# Patient Record
Sex: Female | Born: 1950 | Race: White | Hispanic: No | Smoking: Never smoker
Health system: Southern US, Community
[De-identification: ages and names within clinical notes are randomized; demographics above are authoritative.]

## PROBLEM LIST (undated history)

## (undated) DIAGNOSIS — Z8619 Personal history of other infectious and parasitic diseases: Secondary | ICD-10-CM

## (undated) DIAGNOSIS — M1990 Unspecified osteoarthritis, unspecified site: Secondary | ICD-10-CM

## (undated) DIAGNOSIS — T8859XA Other complications of anesthesia, initial encounter: Secondary | ICD-10-CM

## (undated) DIAGNOSIS — F32A Depression, unspecified: Secondary | ICD-10-CM

## (undated) DIAGNOSIS — D039 Melanoma in situ, unspecified: Secondary | ICD-10-CM

## (undated) DIAGNOSIS — T7840XA Allergy, unspecified, initial encounter: Secondary | ICD-10-CM

## (undated) DIAGNOSIS — C801 Malignant (primary) neoplasm, unspecified: Secondary | ICD-10-CM

## (undated) DIAGNOSIS — K219 Gastro-esophageal reflux disease without esophagitis: Secondary | ICD-10-CM

## (undated) DIAGNOSIS — E785 Hyperlipidemia, unspecified: Secondary | ICD-10-CM

## (undated) DIAGNOSIS — Z85528 Personal history of other malignant neoplasm of kidney: Secondary | ICD-10-CM

## (undated) DIAGNOSIS — F329 Major depressive disorder, single episode, unspecified: Secondary | ICD-10-CM

## (undated) DIAGNOSIS — N189 Chronic kidney disease, unspecified: Secondary | ICD-10-CM

## (undated) DIAGNOSIS — R1310 Dysphagia, unspecified: Secondary | ICD-10-CM

## (undated) DIAGNOSIS — I10 Essential (primary) hypertension: Secondary | ICD-10-CM

## (undated) DIAGNOSIS — R7989 Other specified abnormal findings of blood chemistry: Secondary | ICD-10-CM

## (undated) DIAGNOSIS — T4145XA Adverse effect of unspecified anesthetic, initial encounter: Secondary | ICD-10-CM

## (undated) DIAGNOSIS — G4730 Sleep apnea, unspecified: Secondary | ICD-10-CM

## (undated) DIAGNOSIS — R52 Pain, unspecified: Secondary | ICD-10-CM

## (undated) HISTORY — PX: FOOT SURGERY: SHX648

## (undated) HISTORY — PX: COLONOSCOPY: SHX174

## (undated) HISTORY — DX: Personal history of other malignant neoplasm of kidney: Z85.528

## (undated) HISTORY — DX: Unspecified osteoarthritis, unspecified site: M19.90

## (undated) HISTORY — PX: COLONOSCOPY W/ POLYPECTOMY: SHX1380

## (undated) HISTORY — PX: JOINT REPLACEMENT: SHX530

## (undated) HISTORY — DX: Gastro-esophageal reflux disease without esophagitis: K21.9

## (undated) HISTORY — PX: MOHS SURGERY: SUR867

## (undated) HISTORY — DX: Dysphagia, unspecified: R13.10

## (undated) HISTORY — DX: Sleep apnea, unspecified: G47.30

## (undated) HISTORY — DX: Melanoma in situ, unspecified: D03.9

## (undated) HISTORY — DX: Hyperlipidemia, unspecified: E78.5

## (undated) HISTORY — PX: CHOLECYSTECTOMY: SHX55

## (undated) HISTORY — DX: Allergy, unspecified, initial encounter: T78.40XA

## (undated) HISTORY — PX: POLYPECTOMY: SHX149

## (undated) HISTORY — DX: Malignant (primary) neoplasm, unspecified: C80.1

## (undated) HISTORY — DX: Chronic kidney disease, unspecified: N18.9

## (undated) HISTORY — DX: Other specified abnormal findings of blood chemistry: R79.89

---
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
## 1990-02-14 HISTORY — PX: GALLBLADDER SURGERY: SHX652

## 1990-02-14 HISTORY — PX: MELANOMA EXCISION: SHX5266

## 1994-02-14 HISTORY — PX: KNEE ARTHROSCOPY: SUR90

## 2007-02-15 HISTORY — PX: SALPINGOOPHORECTOMY: SHX82

## 2010-10-16 LAB — HM MAMMOGRAPHY: HM Mammogram: NORMAL

## 2010-10-16 LAB — HM DIABETES EYE EXAM: HM Diabetic Eye Exam: NORMAL

## 2010-10-16 LAB — HM PAP SMEAR: HM Pap smear: NORMAL

## 2011-07-29 ENCOUNTER — Ambulatory Visit (INDEPENDENT_AMBULATORY_CARE_PROVIDER_SITE_OTHER): Payer: 59 | Admitting: Internal Medicine

## 2011-07-29 ENCOUNTER — Encounter: Payer: Self-pay | Admitting: Internal Medicine

## 2011-07-29 VITALS — BP 160/78 | HR 80 | Temp 98.1°F | Resp 16 | Ht 70.0 in | Wt 173.0 lb

## 2011-07-29 DIAGNOSIS — R0989 Other specified symptoms and signs involving the circulatory and respiratory systems: Secondary | ICD-10-CM

## 2011-07-29 DIAGNOSIS — G4730 Sleep apnea, unspecified: Secondary | ICD-10-CM

## 2011-07-29 DIAGNOSIS — R0609 Other forms of dyspnea: Secondary | ICD-10-CM | POA: Insufficient documentation

## 2011-07-29 DIAGNOSIS — G4733 Obstructive sleep apnea (adult) (pediatric): Secondary | ICD-10-CM | POA: Insufficient documentation

## 2011-07-29 DIAGNOSIS — Z0000 Encounter for general adult medical examination without abnormal findings: Secondary | ICD-10-CM | POA: Insufficient documentation

## 2011-07-29 DIAGNOSIS — E7800 Pure hypercholesterolemia, unspecified: Secondary | ICD-10-CM | POA: Insufficient documentation

## 2011-07-29 DIAGNOSIS — I10 Essential (primary) hypertension: Secondary | ICD-10-CM

## 2011-07-29 MED ORDER — OLMESARTAN MEDOXOMIL 20 MG PO TABS: 20.0000 mg | ORAL_TABLET | Freq: Every day | ORAL | Status: DC

## 2011-07-29 NOTE — Patient Instructions (Signed)
Hypertension As your heart beats, it forces blood through your arteries. This force is your blood pressure. If the pressure is too high, it is called hypertension (HTN) or high blood pressure. HTN is dangerous because you may have it and not know it. High blood pressure may mean that your heart has to work harder to pump blood. Your arteries may be narrow or stiff. The extra work puts you at risk for heart disease, stroke, and other problems.  Blood pressure consists of two numbers, a higher number over a lower, 110/72, for example. It is stated as "110 over 72." The ideal is below 120 for the top number (systolic) and under 80 for the bottom (diastolic). Write down your blood pressure today. You should pay close attention to your blood pressure if you have certain conditions such as:  Heart failure.   Prior heart attack.   Diabetes   Chronic kidney disease.   Prior stroke.   Multiple risk factors for heart disease.  To see if you have HTN, your blood pressure should be measured while you are seated with your arm held at the level of the heart. It should be measured at least twice. A one-time elevated blood pressure reading (especially in the Emergency Department) does not mean that you need treatment. There may be conditions in which the blood pressure is different between your right and left arms. It is important to see your caregiver soon for a recheck. Most people have essential hypertension which means that there is not a specific cause. This type of high blood pressure may be lowered by changing lifestyle factors such as:  Stress.   Smoking.   Lack of exercise.   Excessive weight.   Drug/tobacco/alcohol use.   Eating less salt.  Most people do not have symptoms from high blood pressure until it has caused damage to the body. Effective treatment can often prevent, delay or reduce that damage. TREATMENT  When a cause has been identified, treatment for high blood pressure is  directed at the cause. There are a large number of medications to treat HTN. These fall into several categories, and your caregiver will help you select the medicines that are best for you. Medications may have side effects. You should review side effects with your caregiver. If your blood pressure stays high after you have made lifestyle changes or started on medicines,   Your medication(s) may need to be changed.   Other problems may need to be addressed.   Be certain you understand your prescriptions, and know how and when to take your medicine.   Be sure to follow up with your caregiver within the time frame advised (usually within two weeks) to have your blood pressure rechecked and to review your medications.   If you are taking more than one medicine to lower your blood pressure, make sure you know how and at what times they should be taken. Taking two medicines at the same time can result in blood pressure that is too low.  SEEK IMMEDIATE MEDICAL CARE IF:  You develop a severe headache, blurred or changing vision, or confusion.   You have unusual weakness or numbness, or a faint feeling.   You have severe chest or abdominal pain, vomiting, or breathing problems.  MAKE SURE YOU:   Understand these instructions.   Will watch your condition.   Will get help right away if you are not doing well or get worse.  Document Released: 01/31/2005 Document Revised: 01/20/2011 Document Reviewed:   09/21/2007 ExitCare Patient Information 2012 [place].Sleep Apnea Sleep apnea is a common disorder. The main problem of this disorder is excessive daytime sleepiness and compromised quality of life. This may include social and emotional problems. There are two types of sleep apnea.  Obstructive sleep apnea is when breathing stops due to a blocked airway.   Central sleep apnea is a malfunction of the brain's normal signal to breathe.  SYMPTOMS  Restless sleep.   Falling asleep while  driving and/or during the day.   Loss of energy.   Irritability.   Mood or behavior changes.   Loud, heavy snoring.   Morning headaches.   Trouble concentrating.   Forgetfulness.   Anxiety or depression.   Decreased interest in sex.  Not all people with sleep apnea have all of these symptoms. However, people who have a few of these symptoms should visit their caregiver for an evaluation. Problems related to untreated sleep apnea include:  High blood pressure (hypertension).   Coronary artery disease.   Impotence.   Cognitive dysfunction.   Memory loss.  TREATMENT  For mild cases, treatment may include avoiding sleeping on one's back.   For people with nasal congestion, a decongestant may be prescribed.   Patients with obstructive and central apnea should avoid depressants. This includes alcohol, sedatives and narcotics. Weight loss and diet control are encouraged for overweight patients.   Many serious cases of obstructive sleep apnea can be relieved by a treatment called nasal continuous positive airway pressure (nasal CPAP). Nasal CPAP uses a mask-like device and pump that work together to keep the airway open. The pump delivers air pressure during each breath.   Surgery may help some patients by stopping or reducing the narrowing of the airway due to anatomical defects.  PROGNOSIS  Removing the obstruction usually reverses hypertension and cardiac problems. Untreated, sleep apnea sufferers have a tendency to fall asleep during the day. This is can result in serious accident or loss of ones job. RESEARCH Sleep apnea is currently one of the most active areas of sleep research.  Document Released: 01/21/2002 Document Revised: 01/20/2011 Document Reviewed: 05/19/2005 [facility] Patient Information 2012 [place].

## 2011-07-29 NOTE — Progress Notes (Signed)
  Subjective:    Patient ID: Kelsey Church, female    DOB: [birth year], 61 y.o.   MRN: [id number]  HPI New to me she complains of a 10 year history of heavy snoring, apnea, weight gain, and fatigue.   Review of Systems  Constitutional: Positive for fatigue and unexpected weight change. Negative for fever, chills, diaphoresis, activity change and appetite change.  HENT: Negative.   Eyes: Negative for photophobia, pain, discharge, redness, itching and visual disturbance.  Respiratory: Positive for apnea and shortness of breath (some DOE). Negative for cough, choking, chest tightness, wheezing and stridor.   Cardiovascular: Negative for chest pain, palpitations and leg swelling.  Gastrointestinal: Negative for nausea, vomiting, abdominal pain, diarrhea, constipation, blood in stool and anal bleeding.  Genitourinary: Negative.   Musculoskeletal: Positive for arthralgias (knees). Negative for myalgias, back pain, joint swelling and gait problem.  Skin: Negative for color change, pallor, rash and wound.  Neurological: Negative.   Hematological: Negative for adenopathy. Does not bruise/bleed easily.  Psychiatric/Behavioral: Negative.        Objective:   Physical Exam  Vitals reviewed. Constitutional: She is oriented to person, place, and time. She appears well-developed and well-nourished. No distress.  HENT:  Head: Normocephalic and atraumatic.  Mouth/Throat: Oropharynx is clear and moist. No oropharyngeal exudate.  Eyes: Conjunctivae are normal. Right eye exhibits no discharge. Left eye exhibits no discharge. No scleral icterus.  Neck: Normal range of motion. Neck supple. No JVD present. No tracheal deviation present. No thyromegaly present.  Cardiovascular: Normal rate, regular rhythm, normal heart sounds and intact distal pulses.  Exam reveals no gallop and no friction rub.   No murmur heard. Pulmonary/Chest: Effort normal and breath sounds normal. No stridor. No respiratory  distress. She has no wheezes. She has no rales. She exhibits no tenderness.  Abdominal: Soft. Bowel sounds are normal. She exhibits no distension and no mass. There is no tenderness. There is no rebound and no guarding.  Musculoskeletal: Normal range of motion. She exhibits no edema and no tenderness.  Lymphadenopathy:    She has no cervical adenopathy.  Neurological: She is oriented to person, place, and time.  Skin: Skin is warm and dry. No rash noted. She is not diaphoretic. No erythema. No pallor.  Psychiatric: She has a normal mood and affect. Her behavior is normal. Judgment and thought content normal.         Assessment & Plan:

## 2011-07-31 ENCOUNTER — Encounter: Payer: Self-pay | Admitting: Internal Medicine

## 2011-08-01 ENCOUNTER — Other Ambulatory Visit (INDEPENDENT_AMBULATORY_CARE_PROVIDER_SITE_OTHER): Payer: 59

## 2011-08-01 ENCOUNTER — Encounter: Payer: Self-pay | Admitting: Internal Medicine

## 2011-08-01 DIAGNOSIS — I10 Essential (primary) hypertension: Secondary | ICD-10-CM

## 2011-08-01 DIAGNOSIS — E7800 Pure hypercholesterolemia, unspecified: Secondary | ICD-10-CM

## 2011-08-01 DIAGNOSIS — R0609 Other forms of dyspnea: Secondary | ICD-10-CM

## 2011-08-01 DIAGNOSIS — G4730 Sleep apnea, unspecified: Secondary | ICD-10-CM

## 2011-08-01 DIAGNOSIS — R0989 Other specified symptoms and signs involving the circulatory and respiratory systems: Secondary | ICD-10-CM

## 2011-08-01 LAB — URINALYSIS, ROUTINE W REFLEX MICROSCOPIC
Bilirubin Urine: NEGATIVE
Hgb urine dipstick: NEGATIVE
Nitrite: NEGATIVE
Total Protein, Urine: NEGATIVE

## 2011-08-01 LAB — COMPREHENSIVE METABOLIC PANEL
Albumin: 3.7 g/dL (ref 3.5–5.2)
BUN: 26 mg/dL — ABNORMAL HIGH (ref 6–23)
CO2: 28 mEq/L (ref 19–32)
Calcium: 8.9 mg/dL (ref 8.4–10.5)
Chloride: 107 mEq/L (ref 96–112)
Glucose, Bld: 83 mg/dL (ref 70–99)
Potassium: 4.2 mEq/L (ref 3.5–5.1)

## 2011-08-01 LAB — CBC WITH DIFFERENTIAL/PLATELET
Basophils Relative: 0.5 % (ref 0.0–3.0)
Eosinophils Relative: 2.6 % (ref 0.0–5.0)
Lymphocytes Relative: 27.3 % (ref 12.0–46.0)
Neutrophils Relative %: 61.8 % (ref 43.0–77.0)
RBC: 5.14 Mil/uL — ABNORMAL HIGH (ref 3.87–5.11)
WBC: 6.9 10*3/uL (ref 4.5–10.5)

## 2011-08-01 LAB — LIPID PANEL
LDL Cholesterol: 85 mg/dL (ref 0–99)
Total CHOL/HDL Ratio: 3
Triglycerides: 127 mg/dL (ref 0.0–149.0)

## 2011-08-01 LAB — TSH: TSH: 1.47 u[IU]/mL (ref 0.35–5.50)

## 2011-08-01 NOTE — Assessment & Plan Note (Signed)
I will check her labs today to look for secondary causes and end organ damage and I have asked her to start Benicar

## 2011-08-01 NOTE — Assessment & Plan Note (Signed)
She has s/s c/w OSA so I have asked her to see pulm for a sleep evaluation, today I will check her labs to look for secondary causes

## 2011-08-01 NOTE — Assessment & Plan Note (Signed)
Her EKG does not show any signs of cardiac involvement, I will check her labs to look for secondary causes

## 2011-08-01 NOTE — Assessment & Plan Note (Signed)
Check FLP CMP TSH today

## 2011-09-09 ENCOUNTER — Ambulatory Visit (INDEPENDENT_AMBULATORY_CARE_PROVIDER_SITE_OTHER): Payer: 59 | Admitting: Internal Medicine

## 2011-09-09 ENCOUNTER — Ambulatory Visit (INDEPENDENT_AMBULATORY_CARE_PROVIDER_SITE_OTHER): Payer: 59 | Admitting: Pulmonary Disease

## 2011-09-09 ENCOUNTER — Encounter: Payer: Self-pay | Admitting: Internal Medicine

## 2011-09-09 ENCOUNTER — Encounter: Payer: Self-pay | Admitting: Pulmonary Disease

## 2011-09-09 VITALS — BP 144/88 | HR 76 | Temp 97.4°F | Resp 16 | Wt 175.0 lb

## 2011-09-09 VITALS — BP 110/68 | HR 90 | Temp 98.4°F | Ht 66.0 in | Wt 174.4 lb

## 2011-09-09 DIAGNOSIS — I10 Essential (primary) hypertension: Secondary | ICD-10-CM

## 2011-09-09 DIAGNOSIS — G4730 Sleep apnea, unspecified: Secondary | ICD-10-CM

## 2011-09-09 DIAGNOSIS — M25569 Pain in unspecified knee: Secondary | ICD-10-CM

## 2011-09-09 DIAGNOSIS — M179 Osteoarthritis of knee, unspecified: Secondary | ICD-10-CM

## 2011-09-09 DIAGNOSIS — M1710 Unilateral primary osteoarthritis, unspecified knee: Secondary | ICD-10-CM | POA: Insufficient documentation

## 2011-09-09 DIAGNOSIS — M25561 Pain in right knee: Secondary | ICD-10-CM

## 2011-09-09 DIAGNOSIS — IMO0002 Reserved for concepts with insufficient information to code with codable children: Secondary | ICD-10-CM

## 2011-09-09 MED ORDER — TRAMADOL HCL 50 MG PO TABS: 50.0000 mg | ORAL_TABLET | Freq: Three times a day (TID) | ORAL | Status: AC | PRN

## 2011-09-09 NOTE — Patient Instructions (Signed)
Degenerative Arthritis  You have osteoarthritis. This is the wear and tear arthritis that comes with aging. It is also called degenerative arthritis. This is common in people past middle age. It is caused by stress on the joints. The large weight bearing joints of the lower extremities are most often affected. The knees, hips, back, neck, and hands can become painful, swollen, and stiff. This is the most common type of arthritis. It comes on with age, carrying too much weight, or from an injury.  Treatment includes resting the sore joint until the pain and swelling improve. Crutches or a walker may be needed for severe flares. Only take over-the-counter or prescription medicines for pain, discomfort, or fever as directed by your caregiver. Local heat therapy may improve motion. Cortisone shots into the joint are sometimes used to reduce pain and swelling during flares.  Osteoarthritis is usually not crippling and progresses slowly. There are things you can do to decrease pain:  · Avoid high impact activities.  · Exercise regularly.  · Low impact exercises such as walking, biking and swimming help to keep the muscles strong and keep normal joint function.  · Stretching helps to keep your range of motion.  · Lose weight if you are overweight. This reduces joint stress.  In severe cases when you have pain at rest or increasing disability, joint surgery may be helpful. See your caregiver for follow-up treatment as recommended.   SEEK IMMEDIATE MEDICAL CARE IF:   · You have severe joint pain.  · Marked swelling and redness in your joint develops.  · You develop a high fever.  Document Released: 01/31/2005 Document Revised: 01/20/2011 Document Reviewed: 07/03/2006  ExitCare® Patient Information ©2012 [employer].

## 2011-09-09 NOTE — Patient Instructions (Addendum)
Schedule sleep study Lets see what we can do about the cramps

## 2011-09-09 NOTE — Assessment & Plan Note (Signed)
Her BP is better

## 2011-09-09 NOTE — Assessment & Plan Note (Signed)
Right knee injected with steroids today, she will cont mobic and will add tramadol for more severe pain

## 2011-09-09 NOTE — Progress Notes (Signed)
Subjective:    Patient ID: Kelsey Church, female    DOB: 11/29/1950, 61 y.o.   MRN: [id number]  Arthritis Presents for follow-up visit. She complains of pain and stiffness. She reports no joint swelling or joint warmth. Affected locations include the right knee and left knee. Her pain is at a severity of 6/10. Associated symptoms include pain at night and pain while resting. Pertinent negatives include no diarrhea, dry eyes, dry mouth, dysuria, fatigue, fever, rash, Raynaud's syndrome, uveitis or weight loss. Compliance with total regimen is 76-100%. Compliance with medications is 76-100%.      Review of Systems  Constitutional: Negative.  Negative for fever, weight loss and fatigue.  HENT: Negative.   Eyes: Negative.   Respiratory: Negative.   Cardiovascular: Negative.   Gastrointestinal: Negative.  Negative for diarrhea.  Genitourinary: Negative.  Negative for dysuria.  Musculoskeletal: Positive for arthralgias (R knee > L knee), arthritis and stiffness. Negative for myalgias, back pain, joint swelling and gait problem.  Skin: Negative.  Negative for rash.  Neurological: Negative.   Hematological: Negative.   Psychiatric/Behavioral: Negative.        Objective:   Physical Exam  Vitals reviewed. Constitutional: She appears well-developed and well-nourished. No distress.  HENT:  Head: Normocephalic and atraumatic.  Mouth/Throat: Oropharynx is clear and moist. No oropharyngeal exudate.  Eyes: Conjunctivae are normal. Right eye exhibits no discharge. Left eye exhibits no discharge. No scleral icterus.  Neck: Normal range of motion. Neck supple. No JVD present. No tracheal deviation present. No thyromegaly present.  Cardiovascular: Normal rate, regular rhythm, normal heart sounds and intact distal pulses.  Exam reveals no gallop and no friction rub.   No murmur heard. Pulmonary/Chest: Effort normal and breath sounds normal. No stridor. No respiratory distress. She has no  wheezes. She has no rales. She exhibits no tenderness.  Abdominal: Soft. Bowel sounds are normal. She exhibits no distension and no mass. There is no tenderness. There is no rebound and no guarding.  Musculoskeletal:       Right knee: She exhibits deformity (djd and valgus deformity). She exhibits normal range of motion, no swelling, no effusion, no ecchymosis, no laceration, no erythema, normal alignment, no LCL laxity and normal patellar mobility. no tenderness found.       Left knee: She exhibits swelling (mild diffuse swelling but no crepitance) and deformity (djd and valgus deformity). She exhibits normal range of motion, no effusion, no ecchymosis, no laceration, no erythema, normal alignment, no LCL laxity and normal patellar mobility. no tenderness found.       R knee cleaned with betadine and draped, local anesthesia was obtained with 2% lido with epi, then a 25 gauge 1 1/2 in needle was used from the lateral approach to inject 1/2 cc plain 1% lido and 1/2 cc depo-medrol (40 mg) into the joint space, she tolerated it well with no complications  Lymphadenopathy:    She has no cervical adenopathy.  Skin: She is not diaphoretic.      Lab Results  Component Value Date   WBC 6.9 08/01/2011   HGB 15.6* 08/01/2011   HCT 46.9* 08/01/2011   PLT 229.0 08/01/2011   GLUCOSE 83 08/01/2011   CHOL 181 08/01/2011   TRIG 127.0 08/01/2011   HDL 71.00 08/01/2011   LDLCALC 85 08/01/2011   ALT 13 08/01/2011   AST 13 08/01/2011   NA 142 08/01/2011   K 4.2 08/01/2011   CL 107 08/01/2011   CREATININE 0.9 08/01/2011   BUN  26* 08/01/2011   CO2 28 08/01/2011   TSH 1.47 08/01/2011      Assessment & Plan:

## 2011-09-09 NOTE — Assessment & Plan Note (Signed)
Low pre test probability Given excessive daytime somnolence, narrow pharyngeal exam & loud snoring, obstructive sleep apnea is very likely & an overnight polysomnogram will be scheduled as a split study. The pathophysiology of obstructive sleep apnea , it's cardiovascular consequences & modes of treatment including CPAP were discused with the patient in detail & they evidenced understanding. Cause of frequent awakenings include cramps in left knee - she will look into holistic options for this.

## 2011-09-09 NOTE — Progress Notes (Signed)
  Subjective:    Patient ID: Kelsey Church, female    DOB: Dec 14, 1950, 61 y.o.   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 She is moved from [place] within the past year to be with her son in [place] after the demise of her husband. Family members have noted her loud snoring and breathing pauses during her sleep. She has tried snoring strips and using a contoured pillow but this has not decrease her snoring. Epworth sleepiness score is 7/24. She report sleepiness while sitting and reading and watching TV towards late evening. She was recently started on Celexa for depression and falls asleep within 30 minutes of taking this. Bedtime is between 9 to 10 PM, she sleeps on her left side due to bursitis in her right hip. She's broken up at least 2-3 times a week 9Y cramping in her left knee and she has to get up and walk around for about 10 minutes before falling asleep again. She is out of bed by 6 AM without headaches and reports occasional dryness of mouth. She feels like she is not rested. She denies excessive use of caffeinated beverages or recent weight gain. She has a full plate dentures for her lower jaw which was made in 2006. There is no history suggestive of cataplexy, sleep paralysis or parasomnias   Past Medical History  Diagnosis Date  . Arthritis   . Melanoma in situ     Past Surgical History  Procedure Date  . Appendectomy 1958  . Gallbladder surgery 02/14/1990  . Salpingoophorectomy 2009    Right  . Knee arthroscopy 1996   No Known Allergies  History   Social History  . Marital Status: Widowed    Spouse Name: N/A    Number of Children: 1  . Years of Education: N/A   Occupational History  . [employer]   Social History Main Topics  . Smoking status: Never Smoker   . Smokeless tobacco: Never Used  . Alcohol Use: No  . Drug Use: No  . Sexually Active: Not Currently   Other Topics Concern    . Not on file   Social History Narrative   Caffienated drinks-noSeat belt use often-yesRegular Exercise-yesSmoke alarm in the home-yesFirearms/guns in the home-noHistory of physical abuse-no       Review of Systems  Constitutional: Negative for fever, appetite change and unexpected weight change.  HENT: Positive for congestion, sneezing and dental problem. Negative for sore throat and trouble swallowing.   Respiratory: Positive for shortness of breath. Negative for cough.   Cardiovascular: Negative for chest pain, palpitations and leg swelling.  Gastrointestinal: Negative for abdominal pain.  Musculoskeletal: Positive for arthralgias. Negative for joint swelling.  Skin: Negative for rash.  Neurological: Negative for headaches.  Psychiatric/Behavioral: Positive for decreased concentration. The patient is not nervous/anxious.        Objective:   Physical Exam  Gen. Pleasant, well-nourished, in no distress, normal affect ENT - no lesions, no post nasal drip, class 2 airway Neck: No JVD, no thyromegaly, no carotid bruits Lungs: no use of accessory muscles, no dullness to percussion, clear without rales or rhonchi  Cardiovascular: Rhythm regular, heart sounds  normal, no murmurs or gallops, no peripheral edema Abdomen: soft and non-tender, no hepatosplenomegaly, BS normal. Musculoskeletal: No deformities, no cyanosis or clubbing Neuro:  alert, non focal       Assessment & Plan:

## 2011-09-09 NOTE — Assessment & Plan Note (Signed)
Ortho referral and start PT

## 2011-09-15 DIAGNOSIS — G4730 Sleep apnea, unspecified: Secondary | ICD-10-CM

## 2011-09-15 HISTORY — PX: INJECTION KNEE: SHX2446

## 2011-09-15 HISTORY — DX: Sleep apnea, unspecified: G47.30

## 2011-09-30 ENCOUNTER — Telehealth: Payer: Self-pay

## 2011-09-30 ENCOUNTER — Ambulatory Visit: Payer: 59 | Admitting: Internal Medicine

## 2011-09-30 MED ORDER — MELOXICAM 15 MG PO TABS: 15.0000 mg | ORAL_TABLET | Freq: Every day | ORAL | Status: DC

## 2011-09-30 MED ORDER — CONJ ESTROG-MEDROXYPROGEST ACE 0.625-2.5 MG PO TABS: 1.0000 | ORAL_TABLET | Freq: Every day | ORAL | Status: DC

## 2011-09-30 NOTE — Telephone Encounter (Signed)
Patient request refills

## 2011-10-06 ENCOUNTER — Ambulatory Visit (HOSPITAL_BASED_OUTPATIENT_CLINIC_OR_DEPARTMENT_OTHER): Payer: 59 | Attending: Pulmonary Disease

## 2011-10-06 VITALS — Ht 66.0 in | Wt 175.0 lb

## 2011-10-06 DIAGNOSIS — G4730 Sleep apnea, unspecified: Secondary | ICD-10-CM

## 2011-10-06 DIAGNOSIS — G4733 Obstructive sleep apnea (adult) (pediatric): Secondary | ICD-10-CM | POA: Insufficient documentation

## 2011-10-07 ENCOUNTER — Ambulatory Visit (AMBULATORY_SURGERY_CENTER): Payer: 59 | Admitting: *Deleted

## 2011-10-07 VITALS — Ht 66.0 in | Wt 176.5 lb

## 2011-10-07 DIAGNOSIS — Z1211 Encounter for screening for malignant neoplasm of colon: Secondary | ICD-10-CM

## 2011-10-07 MED ORDER — NA SULFATE-K SULFATE-MG SULF 17.5-3.13-1.6 GM/177ML PO SOLN: 1.0000 | Freq: Once | ORAL | Status: DC

## 2011-10-21 ENCOUNTER — Ambulatory Visit (INDEPENDENT_AMBULATORY_CARE_PROVIDER_SITE_OTHER): Payer: 59 | Admitting: Pulmonary Disease

## 2011-10-21 ENCOUNTER — Encounter: Payer: Self-pay | Admitting: Pulmonary Disease

## 2011-10-21 VITALS — BP 118/70 | HR 79 | Temp 98.0°F | Ht 66.0 in | Wt 178.6 lb

## 2011-10-21 DIAGNOSIS — G4730 Sleep apnea, unspecified: Secondary | ICD-10-CM

## 2011-10-21 NOTE — Progress Notes (Signed)
  Subjective:    Patient ID: Kelsey Church, female    DOB: February 15, 1950, 61 y.o.   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  She is moved from [place] within the past year to be with her son in [place] after the demise of her husband. Family members have noted her loud snoring and breathing pauses during her sleep. She has tried snoring strips and using a contoured pillow but this has not decrease her snoring.  Epworth sleepiness score is 7/24. She report sleepiness while sitting and reading and watching TV towards late evening. She was recently started on Celexa for depression and falls asleep within 30 minutes of taking this. Bedtime is between 9 to 10 PM, she sleeps on her left side due to bursitis in her right hip. She's broken up at least 2-3 times a week 9Y cramping in her left knee and she has to get up and walk around for about 10 minutes before falling asleep again. She is out of bed by 6 AM without headaches and reports occasional dryness of mouth. She feels like she is not rested. She denies excessive use of caffeinated beverages or recent weight gain. She has a full plate dentures for her lower jaw which was made in 2006.  PSG showed moderate OSa with AHi 22/h & lowest desatn to 87% corrected by CPAP 9 cm with swift nasal pillows-supine & REM sleep was noted. There is no history suggestive of cataplexy, sleep paralysis or parasomnias      Review of Systems neg for any significant sore throat, dysphagia, itching, sneezing, nasal congestion or excess/ purulent secretions, fever, chills, sweats, unintended wt loss, pleuritic or exertional cp, hempoptysis, orthopnea pnd or change in chronic leg swelling. Also denies presyncope, palpitations, heartburn, abdominal pain, nausea, vomiting, diarrhea or change in bowel or urinary habits, dysuria,hematuria, rash, arthralgias, visual complaints, headache, numbness weakness or ataxia.       Objective:   Physical Exam  Gen. Pleasant, well-nourished, in no distress ENT - no lesions, no post nasal drip Neck: No JVD, no thyromegaly, no carotid bruits Lungs: no use of accessory muscles, no dullness to percussion, clear without rales or rhonchi  Cardiovascular: Rhythm regular, heart sounds  normal, no murmurs or gallops, no peripheral edema Musculoskeletal: No deformities, no cyanosis or clubbing         Assessment & Plan:

## 2011-10-21 NOTE — Patient Instructions (Addendum)
We will set you up with CPAP 9cm with swift small pillows &  Humidity & check download in 4 wks

## 2011-10-21 NOTE — Assessment & Plan Note (Signed)
CPAP 9cm with swift small pillows &  Humidity & check download in 4 wks   Weight loss encouraged, compliance with goal of at least 4-6 hrs every night is the expectation. Advised against medications with sedative side effects Cautioned against driving when sleepy - understanding that sleepiness will vary on a day to day basis

## 2011-10-24 DIAGNOSIS — G4710 Hypersomnia, unspecified: Secondary | ICD-10-CM

## 2011-10-24 DIAGNOSIS — G4730 Sleep apnea, unspecified: Secondary | ICD-10-CM

## 2011-10-25 NOTE — Procedures (Signed)
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  At the time of this study, she weighed 175 pounds with a height of 5 feet 6 inches, BMI of 28, neck size of 13.  EPWORTH SLEEPINESS SCORE:  8.  Bedtime medications included Prempro and meloxicam.  This nocturnal polysomnogram was performed with sleep technologist in attendance.  EEG, EOG, EMG, EKG, and respiratory parameters were recorded.  Sleep stages, arousals, limb movements, and respiratory data were scored according to criteria laid out by the American Academy of Sleep Medicine.  SLEEP ARCHITECTURE:  Lights out was at 10:04 p.m.  Lights on was at 5:10 a.m.  CPAP was initiated at 1:13 a.m.  During the diagnostic portion, total sleep time was 125 minutes with a sleep period time of 135 minutes and a sleep latency of 52 minutes.  Latency to REM sleep was 97 minutes. Wake after sleep onset was 10 minutes.  Sleep stages of the percentage of total sleep time was N1 3.6%, N2 93%, N3 0.4%, and REM sleep 3.2%. Supine sleep accounted for 46 minutes.  During the titration portion, 25 minutes of REM sleep was noted, and 66 minutes of REM sleep.  RESPIRATORY DATA:  During the baseline portion, there were 9 obstructive apneas, 6 central apneas, 0 mixed apneas, and 30 hypopneas with an apnea- hypopnea index of 23 events per hour, and the lowest desaturation of 87%.  Due to this degree of respiratory disturbance, CPAP was initiated at 4 cm and titrated to a  final level of 9 cm.  At a level of 7 cm for 42 minutes including 10 minutes of REM sleep, 1 obstructive apnea, 1 central apnea, 1 hypopnea were noted with an AHI of 4 events per hour. The lowest desaturation of 91% at a final level of 9 cm for 6.5 minutes including 4 minutes of REM sleep.  No events or desaturations were noted.  This appears to be the optimal level during the study.  AROUSAL DATA:  During diagnostic portion, there were 6 arousals with an arousal index of 3 events per hour.  Very few arousals were noted during the titration portion.  OXYGEN DATA:  The lowest desaturation during the diagnostic portion was 87%.  During titration, sats stayed at 91% and above.  CARDIAC DATA:  No significant arrhythmias were noted.  The low heart rate was 30 beats per minute.  The high heart rate recorded was an artifact.  MOVEMENT-PARASOMNIA:  No significant limb movements were noted.  DISCUSSION:  She was desensitized with a small Swift FX nasal pillows. CPAP titration appears optimal.  IMPRESSION: 1. Moderate obstructive sleep apnea with hypopneas causing sleep  fragmentation and oxygen desaturation. 2. This was corrected by CPAP of 9 cm with small nasal pillows.  CPAP     titration was optimal. 3. No evidence of cardiac arrhythmias, limb movements, or behavioral     disturbance during sleep.  RECOMMENDATIONS: 1. CPAP should be initiated at 9 cm with nasal pillows and humidity,     compliance can be monitored at this level. 2. She should be cautioned against driving when sleepy.  She should be     advised against medications with sedative side effects.     Rigoberto Noel, MD    RVA/MEDQ  D:  10/24/2011 09:24:46  T:  10/25/2011 03:20:58  Job:  [id number]

## 2011-10-28 ENCOUNTER — Encounter: Payer: Self-pay | Admitting: Internal Medicine

## 2011-10-28 ENCOUNTER — Ambulatory Visit (AMBULATORY_SURGERY_CENTER): Payer: 59 | Admitting: Internal Medicine

## 2011-10-28 VITALS — BP 125/84 | HR 78 | Temp 97.3°F | Resp 20 | Ht 66.0 in | Wt 175.0 lb

## 2011-10-28 DIAGNOSIS — D126 Benign neoplasm of colon, unspecified: Secondary | ICD-10-CM

## 2011-10-28 DIAGNOSIS — Z1211 Encounter for screening for malignant neoplasm of colon: Secondary | ICD-10-CM

## 2011-10-28 LAB — HM COLONOSCOPY

## 2011-10-28 MED ORDER — SODIUM CHLORIDE 0.9 % IV SOLN: 500.0000 mL | INTRAVENOUS | Status: DC

## 2011-10-28 NOTE — Progress Notes (Signed)
PT TOLERATED PROCEDURE WELL. EWM

## 2011-10-28 NOTE — Progress Notes (Signed)
PROPOFOL PER D MERRITT CRNA, ALL MEDS TITRATED PER CRNA DURING THE PROCEDURE. SEE SCANNED INTRA PROCEDURE REPORT. EWM

## 2011-10-28 NOTE — Progress Notes (Signed)
Pt c/o mild headache in recovery room.  Pt felt it was d/t no coffee and food.  I advised her to eat when she gets home and call us back if it does not resolve.  Maw  Patient did not experience any of the following events: a burn prior to discharge; a fall within the facility; wrong site/side/patient/procedure/implant event; or a hospital transfer or hospital admission upon discharge from the facility. [phone]) Patient did not have preoperative order for IV antibiotic SSI prophylaxis. [phone])

## 2011-10-28 NOTE — Op Note (Signed)
[place]  [address] [place], [id number]   COLONOSCOPY PROCEDURE REPORT  PATIENT: Kelsey, Church  MR#: [id number] BIRTHDATE: 05/23/1950 , 61  yrs. old GENDER: Female ENDOSCOPIST: Jerene Bears, MD REFERRED [id number], Arvid Right. PROCEDURE DATE:  10/28/2011 PROCEDURE:   Colonoscopy with cold biopsy polypectomy and Colonoscopy with snare polypectomy ASA CLASS:   Class II INDICATIONS:average risk screening and first colonoscopy. MEDICATIONS: MAC sedation, administered by CRNA and Propofol (Diprivan) 220  DESCRIPTION OF PROCEDURE:   After the risks benefits and alternatives of the procedure were thoroughly explained, informed consent was obtained.  A digital rectal exam revealed no rectal mass.   The LB CF-[id number]  endoscope was introduced through the anus and advanced to the terminal ileum which was intubated for a short distance. No adverse events experienced.   The quality of the prep was Suprep good  The instrument was then slowly withdrawn as the colon was fully examined.      COLON FINDINGS: The mucosa appeared normal in the terminal ileum. Two sessile polyps ranging between 3-37mm in size were found at the cecum.  Polypectomy was performed with cold forceps and using cold snare.  All resections were complete and all polyp tissue was completely retrieved.   Three sessile polyps measuring 5-8 mm in size were found in the descending colon.  Polypectomy was performed using cold snare and using hot snare.  All resections were complete and all polyp tissue was completely retrieved.   Two sessile polyps measuring 5-8 mm in size were found in the sigmoid colon. Polypectomy was performed using cold snare and using hot snare. All resections were complete and all polyp tissue was completely retrieved.   Mild diverticulosis was noted in the ascending colon and sigmoid colon.  Retroflexed views revealed no abnormalities. The time to cecum=5 minutes 19  seconds.  Withdrawal time=17 minutes 13 seconds.  The scope was withdrawn and the procedure completed.  COMPLICATIONS: There were no complications.  ENDOSCOPIC IMPRESSION: 1.   Normal mucosa in the terminal ileum 2.   Two sessile polyps ranging between 3-37mm in size were found at the cecum; Polypectomy was performed with cold forceps and using cold snare 3.   Three sessile polyps measuring 5-8 mm in size were found in the descending colon; Polypectomy was performed using cold snare and using hot snare 4.   Two sessile polyps measuring 5-8 mm in size were found in the sigmoid colon; Polypectomy was performed using cold snare and using hot snare 5.   Mild diverticulosis was noted in the ascending colon and sigmoid colon  RECOMMENDATIONS: 1.  Hold aspirin, aspirin products, and anti-inflammatory medication for 2 weeks. 2.  High fiber diet 3.  If the polyps removed today are proven to be adenomatous (pre-cancerous) polyps, you will need a colonoscopy in 3 years. Otherwise you should continue to follow colorectal cancer screening guidelines for "routine risk" patients with a colonoscopy in 10 years.  You will receive a letter within 1-2 weeks with the results of your biopsy as well as final recommendations.  Please call my office if you have not received a letter after 3 weeks.   eSigned:  Jerene Bears, MD 10/28/2011 9:06 AM   cc: The Patient and Janith Lima, MD   PATIENT NAME:  Kelsey, Church MR#: [id number]

## 2011-10-28 NOTE — Patient Instructions (Addendum)
Handouts were given to your care partner on diverticulosis, high fiber diet and polyps.  Please hold aspirin, aspirin products, and anti-inflammatory medications for 2 weeks until 11/11/11.  You may resume your other current medications today.  Please call if any questions or concerns.    YOU HAD AN ENDOSCOPIC PROCEDURE TODAY AT [facility]: Refer to the procedure report that was given to you for any specific questions about what was found during the examination.  If the procedure report does not answer your questions, please call your gastroenterologist to clarify.  If you requested that your care partner not be given the details of your procedure findings, then the procedure report has been included in a sealed envelope for you to review at your convenience later.  YOU SHOULD EXPECT: Some feelings of bloating in the abdomen. Passage of more gas than usual.  Walking can help get rid of the air that was put into your GI tract during the procedure and reduce the bloating. If you had a lower endoscopy (such as a colonoscopy or flexible sigmoidoscopy) you may notice spotting of blood in your stool or on the toilet paper. If you underwent a bowel prep for your procedure, then you may not have a normal bowel movement for a few days.  DIET: Your first meal following the procedure should be a light meal and then it is ok to progress to your normal diet.  A half-sandwich or bowl of soup is an example of a good first meal.  Heavy or fried foods are harder to digest and may make you feel nauseous or bloated.  Likewise meals heavy in dairy and vegetables can cause extra gas to form and this can also increase the bloating.  Drink plenty of fluids but you should avoid alcoholic beverages for 24 hours.  ACTIVITY: Your care partner should take you home directly after the procedure.  You should plan to take it easy, moving slowly for the rest of the day.  You can resume normal activity the day after the  procedure however you should NOT DRIVE or use heavy machinery for 24 hours (because of the sedation medicines used during the test).    SYMPTOMS TO REPORT IMMEDIATELY: A gastroenterologist can be reached at any hour.  During normal business hours, 8:30 AM to 5:00 PM Monday through Friday, call [phone].  After hours and on weekends, please call the GI answering service at [phone] who will take a message and have the physician on call contact you.   Following lower endoscopy (colonoscopy or flexible sigmoidoscopy):  Excessive amounts of blood in the stool  Significant tenderness or worsening of abdominal pains  Swelling of the abdomen that is new, acute  Fever of 100F or higher    FOLLOW UP: If any biopsies were taken you will be contacted by phone or by letter within the next 1-3 weeks.  Call your gastroenterologist if you have not heard about the biopsies in 3 weeks.  Our staff will call the home number listed on your records the next business day following your procedure to check on you and address any questions or concerns that you may have at that time regarding the information given to you following your procedure. This is a courtesy call and so if there is no answer at the home number and we have not heard from you through the emergency physician on call, we will assume that you have returned to your regular daily activities without incident.  SIGNATURES/CONFIDENTIALITY: You and/or your care partner have signed paperwork which will be entered into your electronic medical record.  These signatures attest to the fact that that the information above on your After Visit Summary has been reviewed and is understood.  Full responsibility of the confidentiality of this discharge information lies with you and/or your care-partner.

## 2011-10-28 NOTE — Progress Notes (Signed)
09:07 Hung 2nd bag of normal saline 0.9% 560ml in the recovery room. Maw

## 2011-10-31 ENCOUNTER — Telehealth: Payer: Self-pay | Admitting: *Deleted

## 2011-10-31 NOTE — Telephone Encounter (Signed)
  Follow up Call-  Call back number 10/28/2011  Post procedure Call Back phone  # [phone]  Permission to leave phone message Yes     Left message.

## 2011-11-01 ENCOUNTER — Encounter: Payer: Self-pay | Admitting: Internal Medicine

## 2011-12-02 ENCOUNTER — Ambulatory Visit: Payer: 59 | Admitting: Pulmonary Disease

## 2011-12-12 ENCOUNTER — Telehealth: Payer: Self-pay | Admitting: Pulmonary Disease

## 2011-12-12 NOTE — Telephone Encounter (Signed)
Faxed [facility] to [facility]

## 2011-12-29 ENCOUNTER — Telehealth: Payer: Self-pay | Admitting: Pulmonary Disease

## 2011-12-29 NOTE — Telephone Encounter (Signed)
Download 11/13 Good control of events on CPAP 9 cm - cpap working well

## 2011-12-30 NOTE — Telephone Encounter (Signed)
I spoke with patient about results and she verbalized understanding and had no questions 

## 2012-01-10 ENCOUNTER — Telehealth: Payer: Self-pay

## 2012-01-10 MED ORDER — MOMETASONE FUROATE 50 MCG/ACT NA SUSP: 2.0000 | Freq: Every day | NASAL | Status: DC

## 2012-01-10 NOTE — Telephone Encounter (Signed)
done

## 2012-01-10 NOTE — Telephone Encounter (Signed)
Patient called request rx for nasonex to be sent to [facility].

## 2012-01-30 ENCOUNTER — Other Ambulatory Visit: Payer: Self-pay | Admitting: Internal Medicine

## 2012-02-03 ENCOUNTER — Ambulatory Visit: Payer: 59 | Admitting: Pulmonary Disease

## 2012-02-03 ENCOUNTER — Ambulatory Visit (INDEPENDENT_AMBULATORY_CARE_PROVIDER_SITE_OTHER): Payer: 59 | Admitting: Pulmonary Disease

## 2012-02-03 ENCOUNTER — Encounter: Payer: Self-pay | Admitting: Pulmonary Disease

## 2012-02-03 VITALS — BP 120/74 | HR 77 | Temp 98.1°F | Ht 66.0 in | Wt 176.2 lb

## 2012-02-03 DIAGNOSIS — G4730 Sleep apnea, unspecified: Secondary | ICD-10-CM

## 2012-02-03 NOTE — Assessment & Plan Note (Signed)
Ct CPAP 9 cm Weight loss encouraged, compliance with goal of at least 6 hrs every night is the expectation. Advised against medications with sedative side effects Cautioned against driving when sleepy - understanding that sleepiness will vary on a day to day basis

## 2012-02-03 NOTE — Progress Notes (Signed)
  Subjective:    Patient ID: Kelsey Church, female    DOB: 01-Mar-1950, 61 y.o.   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  She is moved from [place] within the past year to be with her son in [place] after the demise of her husband. Family members have noted her loud snoring and breathing pauses during her sleep. She has tried snoring strips and using a contoured pillow but this has not decrease her snoring.  Epworth sleepiness score is 7/24. She report sleepiness while sitting and reading and watching TV towards late evening. She was recently started on Celexa for depression and falls asleep within 30 minutes of taking this. Bedtime is between 9 to 10 PM, she sleeps on her left side due to bursitis in her right hip. She's broken up at least 2-3 times a week 9Y cramping in her left knee and she has to get up and walk around for about 10 minutes before falling asleep again. She is out of bed by 6 AM without headaches and reports occasional dryness of mouth. She feels like she is not rested. She denies excessive use of caffeinated beverages or recent weight gain. She has a full plate dentures for her lower jaw which was made in 2006.  PSG showed moderate OSa with AHi 22/h & lowest desatn to 87% corrected by CPAP 9 cm with swift nasal pillows-supine & REM sleep was noted.    February 27, 2012 Her mother passed away suddenly after hiatal hernia surgery- autopsy planned  wears cpap everynight x 7 hrs a night. feeling rested during the day. can tell a difference since using the cpap. Felt tired the few nights she missed it Download 11/13 Good control of events on CPAP 9 cm - cpap working well     Review of Systems neg for any significant sore throat, dysphagia, itching, sneezing, nasal congestion or excess/ purulent secretions, fever, chills, sweats, unintended wt loss, pleuritic or exertional cp, hempoptysis, orthopnea pnd or change in chronic  leg swelling. Also denies presyncope, palpitations, heartburn, abdominal pain, nausea, vomiting, diarrhea or change in bowel or urinary habits, dysuria,hematuria, rash, arthralgias, visual complaints, headache, numbness weakness or ataxia.     Objective:   Physical Exam  Gen. Pleasant, well-nourished, in no distress ENT - no lesions, no post nasal drip Neck: No JVD, no thyromegaly, no carotid bruits Lungs: no use of accessory muscles, no dullness to percussion, clear without rales or rhonchi  Cardiovascular: Rhythm regular, heart sounds  normal, no murmurs or gallops, no peripheral edema Musculoskeletal: No deformities, no cyanosis or clubbing         Assessment & Plan:

## 2012-02-03 NOTE — Patient Instructions (Signed)
You are adjusting well to the CPAP Set at 9 cm

## 2012-02-17 ENCOUNTER — Ambulatory Visit: Payer: 59 | Admitting: Pulmonary Disease

## 2012-03-06 ENCOUNTER — Other Ambulatory Visit: Payer: Self-pay

## 2012-03-06 MED ORDER — CONJ ESTROG-MEDROXYPROGEST ACE 0.625-2.5 MG PO TABS: 1.0000 | ORAL_TABLET | Freq: Every day | ORAL | Status: DC

## 2012-05-02 ENCOUNTER — Telehealth: Payer: Self-pay | Admitting: Pulmonary Disease

## 2012-05-02 NOTE — Telephone Encounter (Signed)
3/14 download on 9 cm - good usage, no residuals

## 2012-05-03 NOTE — Telephone Encounter (Signed)
I spoke with patient about results and she verbalized understanding and had no questions 

## 2012-05-07 ENCOUNTER — Encounter: Payer: Self-pay | Admitting: Pulmonary Disease

## 2012-05-07 ENCOUNTER — Ambulatory Visit (INDEPENDENT_AMBULATORY_CARE_PROVIDER_SITE_OTHER): Payer: 59 | Admitting: Pulmonary Disease

## 2012-05-07 VITALS — BP 110/76 | HR 76 | Temp 97.5°F | Ht 66.0 in | Wt 183.8 lb

## 2012-05-07 DIAGNOSIS — G4733 Obstructive sleep apnea (adult) (pediatric): Secondary | ICD-10-CM

## 2012-05-07 NOTE — Progress Notes (Signed)
  Subjective:    Patient ID: Kelsey Church, female    DOB: May 06, 1950, 62 y.o.   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  She  moved from [place] in 2012  to be with her son in [place] after the demise of her husband. Family members have noted her loud snoring and breathing pauses during her sleep. She has tried snoring strips and using a contoured pillow but this has not decrease her snoring.  Epworth sleepiness score is 7/24. She report sleepiness while sitting and reading and watching TV towards late evening. She was recently started on Celexa for depression and falls asleep within 30 minutes of taking this. Bedtime is between 9 to 10 PM, she sleeps on her left side due to bursitis in her right hip. She's broken up at least 2-3 times a week 9Y cramping in her left knee and she has to get up and walk around for about 10 minutes before falling asleep again. She is out of bed by 6 AM without headaches and reports occasional dryness of mouth. She feels like she is not rested. She denies excessive use of caffeinated beverages or recent weight gain. She has a full plate dentures for her lower jaw which was made in 2006.  PSG showed moderate OSa with AHi 22/h & lowest desatn to 87% corrected by CPAP 9 cm with swift nasal pillows-supine & REM sleep was noted.    05/07/2012 wears cpap everynight x 7 hrs a night. feeling rested during the day.  Felt tired the few nights she missed it  Download 11/13 Good control of events on CPAP 9 cm  3/14 download on 9 cm - good usage, no residuals pt states she wears her CPAP x6-7 hrs a night. no problems w/ mask/machine. feels rested during the day  Review of Systems neg for any significant sore throat, dysphagia, itching, sneezing, nasal congestion or excess/ purulent secretions, fever, chills, sweats, unintended wt loss, pleuritic or exertional cp, hempoptysis, orthopnea pnd or change in chronic leg  swelling. Also denies presyncope, palpitations, heartburn, abdominal pain, nausea, vomiting, diarrhea or change in bowel or urinary habits, dysuria,hematuria, rash, arthralgias, visual complaints, headache, numbness weakness or ataxia.     Objective:   Physical Exam  Gen. Pleasant, well-nourished, in no distress ENT - no lesions, no post nasal drip, class 2 airway Neck: No JVD, no thyromegaly, no carotid bruits Lungs: no use of accessory muscles, no dullness to percussion, clear without rales or rhonchi  Cardiovascular: Rhythm regular, heart sounds  normal, no murmurs or gallops, no peripheral edema Musculoskeletal: No deformities, no cyanosis or clubbing         Assessment & Plan:

## 2012-05-07 NOTE — Assessment & Plan Note (Addendum)
Mod, AHI 22/h, corrected by 9 cm Trial of pediatric nasal pillow  Weight loss encouraged, compliance with goal of at least 4-6 hrs every night is the expectation. Advised against medications with sedative side effects Cautioned against driving when sleepy - understanding that sleepiness will vary on a day to day basis

## 2012-05-07 NOTE — Patient Instructions (Addendum)
Trial of pediatric nasal pillow Your cpap is set at 9cm

## 2012-05-11 ENCOUNTER — Encounter: Payer: Self-pay | Admitting: Pulmonary Disease

## 2012-05-23 ENCOUNTER — Other Ambulatory Visit: Payer: Self-pay | Admitting: Internal Medicine

## 2012-06-04 ENCOUNTER — Encounter: Payer: Self-pay | Admitting: *Deleted

## 2012-06-14 ENCOUNTER — Telehealth: Payer: Self-pay

## 2012-06-14 NOTE — Telephone Encounter (Signed)
Patient called Kelsey Church requesting samples of BP med benicar 20 mg and follow up appt with MD.Sample placed upfront and message routed to scheduler to set up.

## 2012-06-21 ENCOUNTER — Encounter: Payer: Self-pay | Admitting: Internal Medicine

## 2012-06-21 ENCOUNTER — Ambulatory Visit (INDEPENDENT_AMBULATORY_CARE_PROVIDER_SITE_OTHER): Payer: 59 | Admitting: Internal Medicine

## 2012-06-21 ENCOUNTER — Telehealth: Payer: Self-pay | Admitting: Pulmonary Disease

## 2012-06-21 VITALS — BP 128/76 | HR 78 | Temp 98.2°F | Resp 16 | Wt 181.0 lb

## 2012-06-21 DIAGNOSIS — IMO0002 Reserved for concepts with insufficient information to code with codable children: Secondary | ICD-10-CM

## 2012-06-21 DIAGNOSIS — M179 Osteoarthritis of knee, unspecified: Secondary | ICD-10-CM

## 2012-06-21 DIAGNOSIS — M1710 Unilateral primary osteoarthritis, unspecified knee: Secondary | ICD-10-CM

## 2012-06-21 DIAGNOSIS — I10 Essential (primary) hypertension: Secondary | ICD-10-CM

## 2012-06-21 MED ORDER — OLMESARTAN MEDOXOMIL 20 MG PO TABS: 20.0000 mg | ORAL_TABLET | Freq: Every day | ORAL | Status: DC

## 2012-06-21 MED ORDER — CONJ ESTROG-MEDROXYPROGEST ACE 0.625-2.5 MG PO TABS: 1.0000 | ORAL_TABLET | Freq: Every day | ORAL | Status: DC

## 2012-06-21 NOTE — Progress Notes (Signed)
  Subjective:    Patient ID: Kelsey Church, female    DOB: 1950-06-15, 62 y.o.   MRN: [id number]  Hypertension This is a chronic problem. The current episode started more than 1 year ago. The problem has been gradually improving since onset. The problem is controlled. Pertinent negatives include no anxiety, blurred vision, chest pain, headaches, malaise/fatigue, neck pain, orthopnea, palpitations, peripheral edema, PND, shortness of breath or sweats. Agents associated with hypertension include NSAIDs. Past treatments include angiotensin blockers. The current treatment provides significant improvement. There are no compliance problems.  Identifiable causes of hypertension include sleep apnea.      Review of Systems  Constitutional: Negative.  Negative for malaise/fatigue.  HENT: Negative.  Negative for neck pain.   Eyes: Negative.  Negative for blurred vision.  Respiratory: Negative.  Negative for cough, chest tightness, shortness of breath, wheezing and stridor.   Cardiovascular: Negative.  Negative for chest pain, palpitations, orthopnea, leg swelling and PND.  Gastrointestinal: Negative.  Negative for abdominal pain.  Endocrine: Negative.   Genitourinary: Negative.  Negative for frequency, hematuria and difficulty urinating.  Musculoskeletal: Positive for arthralgias (knees).  Skin: Negative.   Allergic/Immunologic: Negative.   Neurological: Negative.  Negative for headaches.  Hematological: Negative.   Psychiatric/Behavioral: Negative.        Objective:   Physical Exam  Vitals reviewed. Constitutional: She is oriented to person, place, and time. She appears well-developed and well-nourished. No distress.  HENT:  Head: Normocephalic and atraumatic.  Mouth/Throat: Oropharynx is clear and moist. No oropharyngeal exudate.  Eyes: Conjunctivae are normal. Right eye exhibits no discharge. Left eye exhibits no discharge. No scleral icterus.  Neck: Normal range of motion. Neck  supple. No JVD present. No tracheal deviation present. No thyromegaly present.  Cardiovascular: Normal rate, regular rhythm, normal heart sounds and intact distal pulses.  Exam reveals no gallop and no friction rub.   No murmur heard. Pulmonary/Chest: Effort normal and breath sounds normal. No stridor. No respiratory distress. She has no wheezes. She has no rales. She exhibits no tenderness.  Abdominal: Soft. Bowel sounds are normal. She exhibits no distension and no mass. There is no tenderness. There is no rebound and no guarding.  Musculoskeletal: Normal range of motion. She exhibits no edema and no tenderness.  Lymphadenopathy:    She has no cervical adenopathy.  Neurological: She is oriented to person, place, and time.  Skin: Skin is warm and dry. No rash noted. She is not diaphoretic. No erythema. No pallor.  Psychiatric: She has a normal mood and affect. Her behavior is normal. Judgment and thought content normal.     Lab Results  Component Value Date   WBC 6.9 08/01/2011   HGB 15.6* 08/01/2011   HCT 46.9* 08/01/2011   PLT 229.0 08/01/2011   GLUCOSE 83 08/01/2011   CHOL 181 08/01/2011   TRIG 127.0 08/01/2011   HDL 71.00 08/01/2011   LDLCALC 85 08/01/2011   ALT 13 08/01/2011   AST 13 08/01/2011   NA 142 08/01/2011   K 4.2 08/01/2011   CL 107 08/01/2011   CREATININE 0.9 08/01/2011   BUN 26* 08/01/2011   CO2 28 08/01/2011   TSH 1.47 08/01/2011       Assessment & Plan:

## 2012-06-21 NOTE — Patient Instructions (Signed)

## 2012-06-21 NOTE — Telephone Encounter (Signed)
This was placed in RA look at. Will forward to RA so he is aware.

## 2012-06-22 NOTE — Telephone Encounter (Signed)
done

## 2012-06-22 NOTE — Assessment & Plan Note (Signed)
Her BP is well controlled 

## 2012-06-22 NOTE — Assessment & Plan Note (Signed)
She is doing well on nsaids

## 2012-06-25 ENCOUNTER — Telehealth: Payer: Self-pay | Admitting: Pulmonary Disease

## 2012-06-25 NOTE — Telephone Encounter (Signed)
Spoke with Mindy, she has faxed the order Will sign off per her request

## 2012-06-25 NOTE — Telephone Encounter (Signed)
The form was faxed back on Friday. I called and made pt aware. Nothing further was needed

## 2012-08-27 ENCOUNTER — Ambulatory Visit: Payer: 59 | Admitting: Internal Medicine

## 2012-09-06 ENCOUNTER — Other Ambulatory Visit (INDEPENDENT_AMBULATORY_CARE_PROVIDER_SITE_OTHER): Payer: 59

## 2012-09-06 ENCOUNTER — Ambulatory Visit (INDEPENDENT_AMBULATORY_CARE_PROVIDER_SITE_OTHER)
Admission: RE | Admit: 2012-09-06 | Discharge: 2012-09-06 | Disposition: A | Discharge status: Home / Self Care | Payer: 59 | Source: Ambulatory Visit | Attending: Internal Medicine | Admitting: Internal Medicine

## 2012-09-06 ENCOUNTER — Ambulatory Visit (INDEPENDENT_AMBULATORY_CARE_PROVIDER_SITE_OTHER): Payer: 59 | Admitting: Internal Medicine

## 2012-09-06 VITALS — BP 138/82 | HR 84 | Temp 98.0°F | Resp 16 | Wt 180.0 lb

## 2012-09-06 DIAGNOSIS — M1710 Unilateral primary osteoarthritis, unspecified knee: Secondary | ICD-10-CM

## 2012-09-06 DIAGNOSIS — Z0000 Encounter for general adult medical examination without abnormal findings: Secondary | ICD-10-CM

## 2012-09-06 DIAGNOSIS — M179 Osteoarthritis of knee, unspecified: Secondary | ICD-10-CM

## 2012-09-06 DIAGNOSIS — S8990XA Unspecified injury of unspecified lower leg, initial encounter: Secondary | ICD-10-CM

## 2012-09-06 DIAGNOSIS — IMO0002 Reserved for concepts with insufficient information to code with codable children: Secondary | ICD-10-CM

## 2012-09-06 DIAGNOSIS — S99929A Unspecified injury of unspecified foot, initial encounter: Secondary | ICD-10-CM | POA: Insufficient documentation

## 2012-09-06 DIAGNOSIS — S99922A Unspecified injury of left foot, initial encounter: Secondary | ICD-10-CM

## 2012-09-06 DIAGNOSIS — I10 Essential (primary) hypertension: Secondary | ICD-10-CM

## 2012-09-06 DIAGNOSIS — Z23 Encounter for immunization: Secondary | ICD-10-CM

## 2012-09-06 LAB — COMPREHENSIVE METABOLIC PANEL
Alkaline Phosphatase: 67 U/L (ref 39–117)
BUN: 20 mg/dL (ref 6–23)
CO2: 28 mEq/L (ref 19–32)
Creatinine, Ser: 0.9 mg/dL (ref 0.4–1.2)
GFR: 68.29 mL/min (ref >60.00)
Glucose, Bld: 83 mg/dL (ref 70–99)
Sodium: 139 mEq/L (ref 135–145)
Total Bilirubin: 0.6 mg/dL (ref 0.3–1.2)
Total Protein: 7.2 g/dL (ref 6.0–8.3)

## 2012-09-06 LAB — LIPID PANEL
Cholesterol: 179 mg/dL (ref 0–200)
HDL: 65.2 mg/dL (ref >39.00)
Triglycerides: 117 mg/dL (ref 0.0–149.0)
VLDL: 23.4 mg/dL (ref 0.0–40.0)

## 2012-09-06 LAB — CBC WITH DIFFERENTIAL/PLATELET
Basophils Relative: 0.8 % (ref 0.0–3.0)
Eosinophils Relative: 0.9 % (ref 0.0–5.0)
HCT: 41.8 % (ref 36.0–46.0)
Lymphs Abs: 2.2 10*3/uL (ref 0.7–4.0)
Monocytes Relative: 6.3 % (ref 3.0–12.0)
Platelets: 258 10*3/uL (ref 150.0–400.0)
RBC: 4.6 Mil/uL (ref 3.87–5.11)
WBC: 8.8 10*3/uL (ref 4.5–10.5)

## 2012-09-06 LAB — URINALYSIS, ROUTINE W REFLEX MICROSCOPIC
Bilirubin Urine: NEGATIVE
Hgb urine dipstick: NEGATIVE
Ketones, ur: NEGATIVE
Total Protein, Urine: NEGATIVE
pH: 7 (ref 5.0–8.0)

## 2012-09-06 IMAGING — CR DG FOOT COMPLETE 3+V*L*
3 series · 3 of 3 positions shown · non-contrast
Comparison: None.

CLINICAL DATA: History of injury of the fourth and fifth toes.
Pain and bruising.

LEFT FOOT - COMPLETE 3+ VIEW

[view not recorded (1 of 3)]
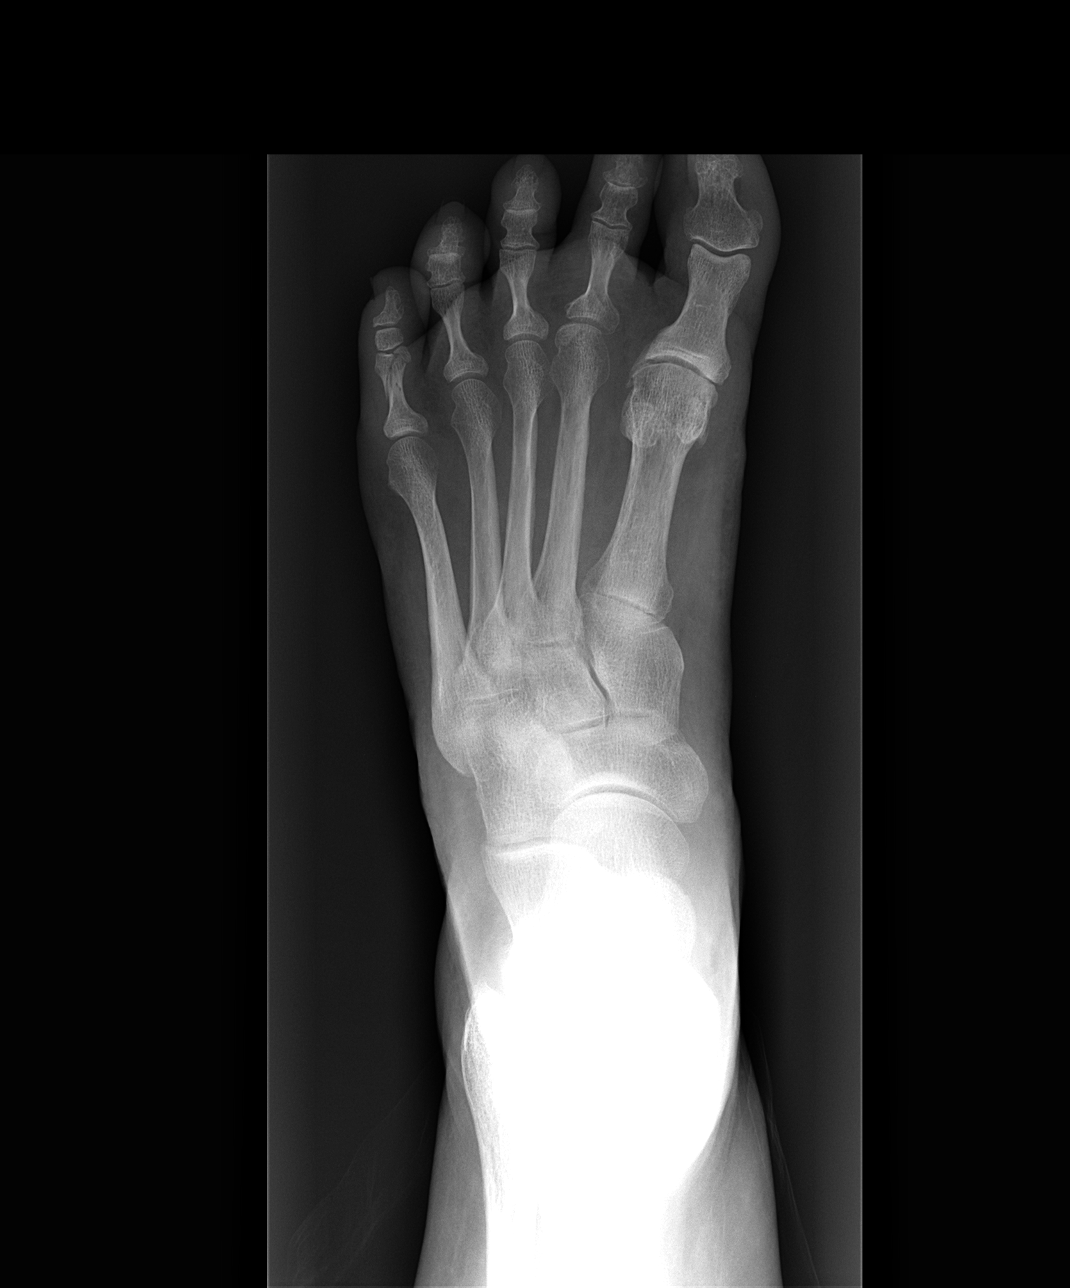

[view not recorded (2 of 3)]
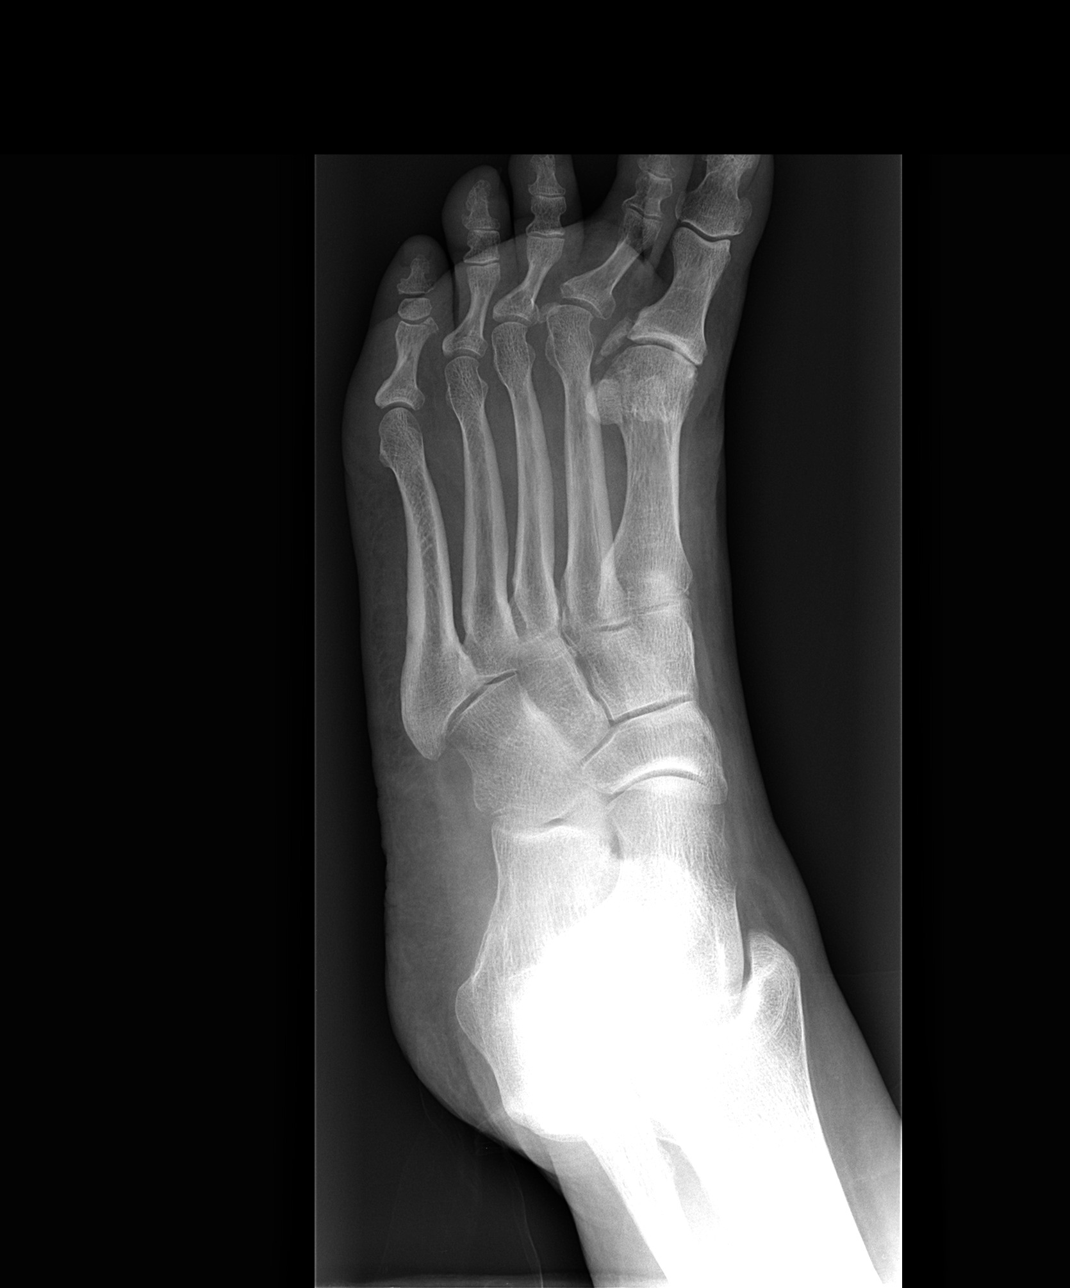

[view not recorded (3 of 3)]
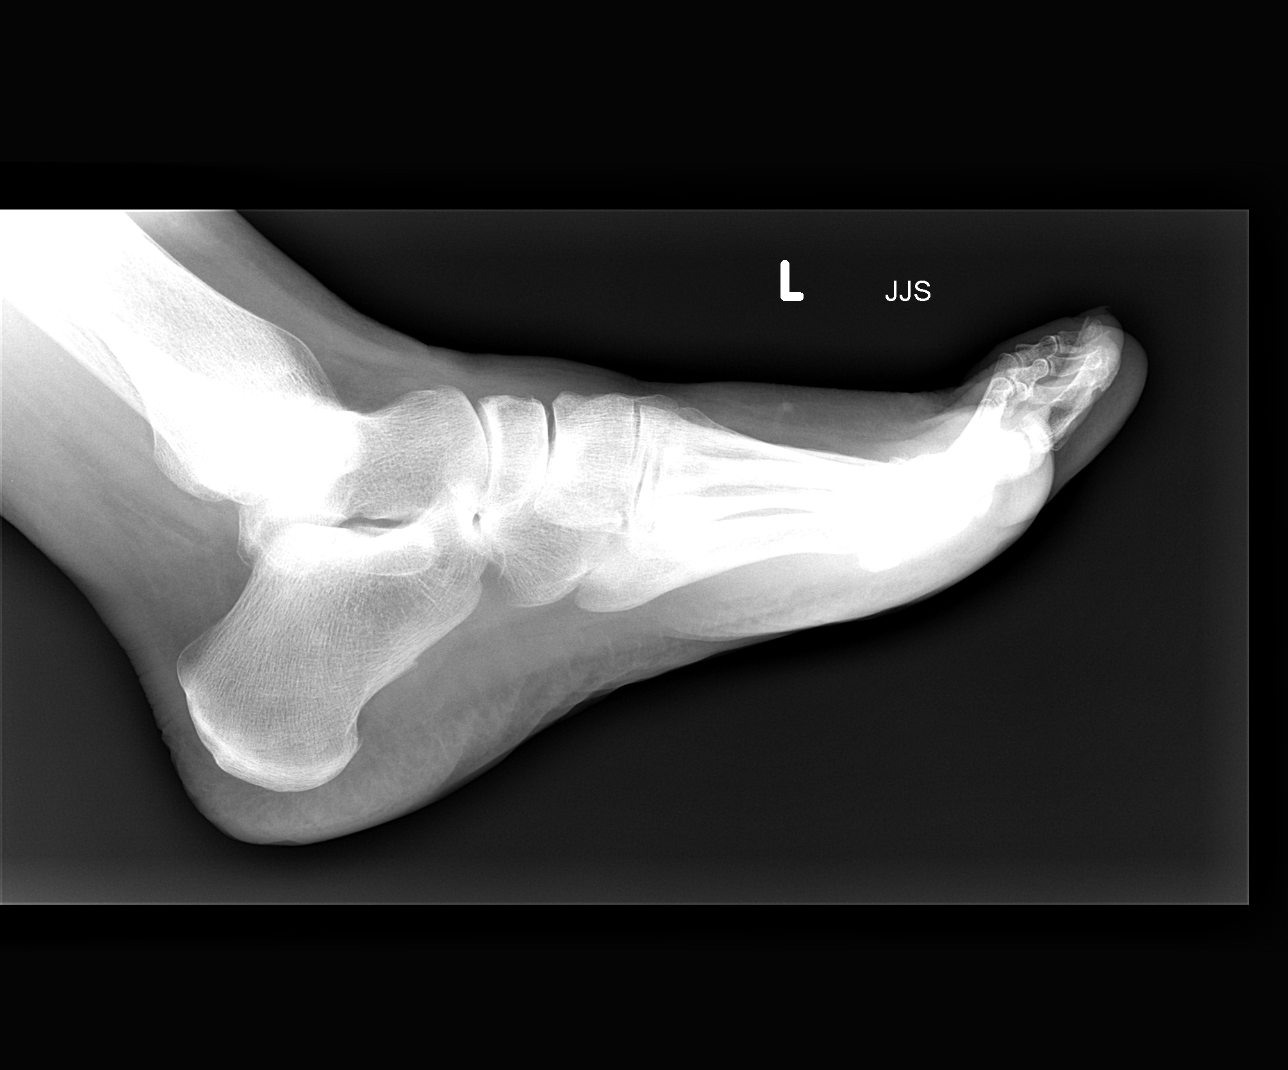

[3 of 3 positions shown; findings below may reference images not displayed]

FINDINGS: There is a comminuted fracture of the mid and distal
portion of the proximal phalanx of the left fifth toe.  The
fracture extends from the mid diaphysis to the articular surface at
the PIP joint of the fifth toe.  There is near apposition of
fracture fragments. No dislocation or other fracture is seen.
There is narrowing of the first MTP joint space with degenerative
spurring representing osteoarthritic change.  Minimal beginning
plantar calcaneal spur is developing.  There is soft tissue
swelling.
IMPRESSION: Comminuted fracture of the proximal phalanx of the fifth toe.  The
articular surface at the fifth toe PIP joint area is involved.

First MTP joint osteoarthritic changes.

## 2012-09-06 MED ORDER — TRAMADOL HCL 50 MG PO TABS: 50.0000 mg | ORAL_TABLET | Freq: Four times a day (QID) | ORAL | Status: DC | PRN

## 2012-09-06 MED ORDER — OLMESARTAN MEDOXOMIL-HCTZ 20-12.5 MG PO TABS: 1.0000 | ORAL_TABLET | Freq: Every day | ORAL | Status: DC

## 2012-09-06 NOTE — Progress Notes (Signed)
Subjective:    Patient ID: Kelsey Church, female    DOB: 07-08-1950, 62 y.o.   MRN: [id number]  HPI Comments: She hurt her left foot about 2 weeks ago and complains of pain, bruising, swelling around the base of the 4th and 5th toes and onto the foot.  Hypertension This is a chronic problem. The current episode started more than 1 year ago. The problem has been gradually worsening since onset. The problem is uncontrolled. Associated symptoms include peripheral edema. Pertinent negatives include no anxiety, blurred vision, chest pain, headaches, malaise/fatigue, neck pain, orthopnea, palpitations, PND, shortness of breath or sweats. Agents associated with hypertension include NSAIDs and estrogens. Past treatments include angiotensin blockers. The current treatment provides moderate improvement. There are no compliance problems.       Review of Systems  Constitutional: Negative.  Negative for fever, chills, malaise/fatigue, diaphoresis, activity change, appetite change, fatigue and unexpected weight change.  HENT: Negative.  Negative for neck pain.   Eyes: Negative.  Negative for blurred vision.  Respiratory: Negative.  Negative for apnea, cough, choking, chest tightness, shortness of breath, wheezing and stridor.   Cardiovascular: Negative.  Negative for chest pain, palpitations, orthopnea, leg swelling and PND.  Gastrointestinal: Negative.  Negative for nausea, vomiting, abdominal pain, diarrhea, constipation, blood in stool and rectal pain.  Endocrine: Negative.   Genitourinary: Negative.   Musculoskeletal: Positive for arthralgias (knees). Negative for myalgias, back pain, joint swelling and gait problem.  Skin: Negative.   Allergic/Immunologic: Negative.   Neurological: Negative.  Negative for dizziness, weakness, light-headedness and headaches.  Hematological: Negative.  Negative for adenopathy. Does not bruise/bleed easily.  Psychiatric/Behavioral: Negative.        Objective:     Physical Exam  Vitals reviewed. Constitutional: She is oriented to person, place, and time. She appears well-developed and well-nourished. No distress.  HENT:  Head: Normocephalic and atraumatic.  Mouth/Throat: Oropharynx is clear and moist. No oropharyngeal exudate.  Eyes: Conjunctivae are normal. Right eye exhibits no discharge. Left eye exhibits no discharge. No scleral icterus.  Neck: Normal range of motion. Neck supple. No JVD present. No tracheal deviation present. No thyromegaly present.  Cardiovascular: Normal rate, regular rhythm, normal heart sounds and intact distal pulses.  Exam reveals no gallop and no friction rub.   No murmur heard. Pulmonary/Chest: Effort normal and breath sounds normal. No stridor. No respiratory distress. She has no wheezes. She has no rales. She exhibits no tenderness.  Abdominal: Soft. Bowel sounds are normal. She exhibits no distension and no mass. There is no tenderness. There is no rebound and no guarding.  Musculoskeletal: Normal range of motion. She exhibits edema (1+ pitting edema in BLE). She exhibits no tenderness.       Right knee: Normal.       Left knee: Normal.       Left foot: She exhibits swelling. She exhibits normal range of motion, no tenderness, no bony tenderness, normal capillary refill, no crepitus, no deformity and no laceration.       Feet:  Lymphadenopathy:    She has no cervical adenopathy.  Neurological: She is oriented to person, place, and time.  Skin: Skin is warm and dry. No rash noted. She is not diaphoretic. No erythema. No pallor.  Psychiatric: She has a normal mood and affect. Her behavior is normal. Judgment and thought content normal.     Lab Results  Component Value Date   WBC 6.9 08/01/2011   HGB 15.6* 08/01/2011   HCT 46.9* 08/01/2011  PLT 229.0 08/01/2011   GLUCOSE 83 08/01/2011   CHOL 181 08/01/2011   TRIG 127.0 08/01/2011   HDL 71.00 08/01/2011   LDLCALC 85 08/01/2011   ALT 13 08/01/2011   AST 13 08/01/2011    NA 142 08/01/2011   K 4.2 08/01/2011   CL 107 08/01/2011   CREATININE 0.9 08/01/2011   BUN 26* 08/01/2011   CO2 28 08/01/2011   TSH 1.47 08/01/2011       Assessment & Plan:

## 2012-09-06 NOTE — Patient Instructions (Signed)
Preventive Care for Adults, Female A healthy lifestyle and preventive care can promote health and wellness. Preventive health guidelines for women include the following key practices.  A routine yearly physical is a good way to check with your caregiver about your health and preventive screening. It is a chance to share any concerns and updates on your health, and to receive a thorough exam.  Visit your dentist for a routine exam and preventive care every 6 months. Brush your teeth twice a day and floss once a day. Good oral hygiene prevents tooth decay and gum disease.  The frequency of eye exams is based on your age, health, family medical history, use of contact lenses, and other factors. Follow your caregiver's recommendations for frequency of eye exams.  Eat a healthy diet. Foods like vegetables, fruits, whole grains, low-fat dairy products, and lean protein foods contain the nutrients you need without too many calories. Decrease your intake of foods high in solid fats, added sugars, and salt. Eat the right amount of calories for you.Get information about a proper diet from your caregiver, if necessary.  Regular physical exercise is one of the most important things you can do for your health. Most adults should get at least 150 minutes of moderate-intensity exercise (any activity that increases your heart rate and causes you to sweat) each week. In addition, most adults need muscle-strengthening exercises on 2 or more days a week.  Maintain a healthy weight. The body mass index (BMI) is a screening tool to identify possible weight problems. It provides an estimate of body fat based on height and weight. Your caregiver can help determine your BMI, and can help you achieve or maintain a healthy weight.For adults 20 years and older:  A BMI below 18.5 is considered underweight.  A BMI of 18.5 to 24.9 is normal.  A BMI of 25 to 29.9 is considered overweight.  A BMI of 30 and above is  considered obese.  Maintain normal blood lipids and cholesterol levels by exercising and minimizing your intake of saturated fat. Eat a balanced diet with plenty of fruit and vegetables. Blood tests for lipids and cholesterol should begin at age 20 and be repeated every 5 years. If your lipid or cholesterol levels are high, you are over 50, or you are at high risk for heart disease, you may need your cholesterol levels checked more frequently.Ongoing high lipid and cholesterol levels should be treated with medicines if diet and exercise are not effective.  If you smoke, find out from your caregiver how to quit. If you do not use tobacco, do not start.  If you are pregnant, do not drink alcohol. If you are breastfeeding, be very cautious about drinking alcohol. If you are not pregnant and choose to drink alcohol, do not exceed 1 drink per day. One drink is considered to be 12 ounces (355 mL) of beer, 5 ounces (148 mL) of wine, or 1.5 ounces (44 mL) of liquor.  Avoid use of street drugs. Do not share needles with anyone. Ask for help if you need support or instructions about stopping the use of drugs.  High blood pressure causes heart disease and increases the risk of stroke. Your blood pressure should be checked at least every 1 to 2 years. Ongoing high blood pressure should be treated with medicines if weight loss and exercise are not effective.  If you are 55 to 62 years old, ask your caregiver if you should take aspirin to prevent strokes.  Diabetes   screening involves taking a blood sample to check your fasting blood sugar level. This should be done once every 3 years, after age 45, if you are within normal weight and without risk factors for diabetes. Testing should be considered at a younger age or be carried out more frequently if you are overweight and have at least 1 risk factor for diabetes.  Breast cancer screening is essential preventive care for women. You should practice "breast  self-awareness." This means understanding the normal appearance and feel of your breasts and may include breast self-examination. Any changes detected, no matter how small, should be reported to a caregiver. Women in their 20s and 30s should have a clinical breast exam (CBE) by a caregiver as part of a regular health exam every 1 to 3 years. After age 40, women should have a CBE every year. Starting at age 40, women should consider having a mammography (breast X-ray test) every year. Women who have a family history of breast cancer should talk to their caregiver about genetic screening. Women at a high risk of breast cancer should talk to their caregivers about having magnetic resonance imaging (MRI) and a mammography every year.  The Pap test is a screening test for cervical cancer. A Pap test can show cell changes on the cervix that might become cervical cancer if left untreated. A Pap test is a procedure in which cells are obtained and examined from the lower end of the uterus (cervix).  Women should have a Pap test starting at age 21.  Between ages 21 and 29, Pap tests should be repeated every 2 years.  Beginning at age 30, you should have a Pap test every 3 years as long as the past 3 Pap tests have been normal.  Some women have medical problems that increase the chance of getting cervical cancer. Talk to your caregiver about these problems. It is especially important to talk to your caregiver if a new problem develops soon after your last Pap test. In these cases, your caregiver may recommend more frequent screening and Pap tests.  The above recommendations are the same for women who have or have not gotten the vaccine for human papillomavirus (HPV).  If you had a hysterectomy for a problem that was not cancer or a condition that could lead to cancer, then you no longer need Pap tests. Even if you no longer need a Pap test, a regular exam is a good idea to make sure no other problems are  starting.  If you are between ages 65 and 70, and you have had normal Pap tests going back 10 years, you no longer need Pap tests. Even if you no longer need a Pap test, a regular exam is a good idea to make sure no other problems are starting.  If you have had past treatment for cervical cancer or a condition that could lead to cancer, you need Pap tests and screening for cancer for at least 20 years after your treatment.  If Pap tests have been discontinued, risk factors (such as a new sexual partner) need to be reassessed to determine if screening should be resumed.  The HPV test is an additional test that may be used for cervical cancer screening. The HPV test looks for the virus that can cause the cell changes on the cervix. The cells collected during the Pap test can be tested for HPV. The HPV test could be used to screen women aged 30 years and older, and should   be used in women of any age who have unclear Pap test results. After the age of 30, women should have HPV testing at the same frequency as a Pap test.  Colorectal cancer can be detected and often prevented. Most routine colorectal cancer screening begins at the age of 50 and continues through age 75. However, your caregiver may recommend screening at an earlier age if you have risk factors for colon cancer. On a yearly basis, your caregiver may provide home test kits to check for hidden blood in the stool. Use of a small camera at the end of a tube, to directly examine the colon (sigmoidoscopy or colonoscopy), can detect the earliest forms of colorectal cancer. Talk to your caregiver about this at age 50, when routine screening begins. Direct examination of the colon should be repeated every 5 to 10 years through age 75, unless early forms of pre-cancerous polyps or small growths are found.  Hepatitis C blood testing is recommended for all people born from 1945 through 1965 and any individual with known risks for hepatitis C.  Practice  safe sex. Use condoms and avoid high-risk sexual practices to reduce the spread of sexually transmitted infections (STIs). STIs include gonorrhea, chlamydia, syphilis, trichomonas, herpes, HPV, and human immunodeficiency virus (HIV). Herpes, HIV, and HPV are viral illnesses that have no cure. They can result in disability, cancer, and death. Sexually active women aged 25 and younger should be checked for chlamydia. Older women with new or multiple partners should also be tested for chlamydia. Testing for other STIs is recommended if you are sexually active and at increased risk.  Osteoporosis is a disease in which the bones lose minerals and strength with aging. This can result in serious bone fractures. The risk of osteoporosis can be identified using a bone density scan. Women ages 65 and over and women at risk for fractures or osteoporosis should discuss screening with their caregivers. Ask your caregiver whether you should take a calcium supplement or vitamin D to reduce the rate of osteoporosis.  Menopause can be associated with physical symptoms and risks. Hormone replacement therapy is available to decrease symptoms and risks. You should talk to your caregiver about whether hormone replacement therapy is right for you.  Use sunscreen with sun protection factor (SPF) of 30 or more. Apply sunscreen liberally and repeatedly throughout the day. You should seek shade when your shadow is shorter than you. Protect yourself by wearing long sleeves, pants, a wide-brimmed hat, and sunglasses year round, whenever you are outdoors.  Once a month, do a whole body skin exam, using a mirror to look at the skin on your back. Notify your caregiver of new moles, moles that have irregular borders, moles that are larger than a pencil eraser, or moles that have changed in shape or color.  Stay current with required immunizations.  Influenza. You need a dose every fall (or winter). The composition of the flu vaccine  changes each year, so being vaccinated once is not enough.  Pneumococcal polysaccharide. You need 1 to 2 doses if you smoke cigarettes or if you have certain chronic medical conditions. You need 1 dose at age 65 (or older) if you have never been vaccinated.  Tetanus, diphtheria, pertussis (Tdap, Td). Get 1 dose of Tdap vaccine if you are younger than age 65, are over 65 and have contact with an infant, are a healthcare worker, are pregnant, or simply want to be protected from whooping cough. After that, you need a Td   booster dose every 10 years. Consult your caregiver if you have not had at least 3 tetanus and diphtheria-containing shots sometime in your life or have a deep or dirty wound.  HPV. You need this vaccine if you are a woman age 26 or younger. The vaccine is given in 3 doses over 6 months.  Measles, mumps, rubella (MMR). You need at least 1 dose of MMR if you were born in 1957 or later. You may also need a second dose.  Meningococcal. If you are age 19 to 21 and a first-year college student living in a residence hall, or have one of several medical conditions, you need to get vaccinated against meningococcal disease. You may also need additional booster doses.  Zoster (shingles). If you are age 60 or older, you should get this vaccine.  Varicella (chickenpox). If you have never had chickenpox or you were vaccinated but received only 1 dose, talk to your caregiver to find out if you need this vaccine.  Hepatitis A. You need this vaccine if you have a specific risk factor for hepatitis A virus infection or you simply wish to be protected from this disease. The vaccine is usually given as 2 doses, 6 to 18 months apart.  Hepatitis B. You need this vaccine if you have a specific risk factor for hepatitis B virus infection or you simply wish to be protected from this disease. The vaccine is given in 3 doses, usually over 6 months. Preventive Services / Frequency Ages 19 to 39  Blood  pressure check.** / Every 1 to 2 years.  Lipid and cholesterol check.** / Every 5 years beginning at age 20.  Clinical breast exam.** / Every 3 years for women in their 20s and 30s.  Pap test.** / Every 2 years from ages 21 through 29. Every 3 years starting at age 30 through age 65 or 70 with a history of 3 consecutive normal Pap tests.  HPV screening.** / Every 3 years from ages 30 through ages 65 to 70 with a history of 3 consecutive normal Pap tests.  Hepatitis C blood test.** / For any individual with known risks for hepatitis C.  Skin self-exam. / Monthly.  Influenza immunization.** / Every year.  Pneumococcal polysaccharide immunization.** / 1 to 2 doses if you smoke cigarettes or if you have certain chronic medical conditions.  Tetanus, diphtheria, pertussis (Tdap, Td) immunization. / A one-time dose of Tdap vaccine. After that, you need a Td booster dose every 10 years.  HPV immunization. / 3 doses over 6 months, if you are 26 and younger.  Measles, mumps, rubella (MMR) immunization. / You need at least 1 dose of MMR if you were born in 1957 or later. You may also need a second dose.  Meningococcal immunization. / 1 dose if you are age 19 to 21 and a first-year college student living in a residence hall, or have one of several medical conditions, you need to get vaccinated against meningococcal disease. You may also need additional booster doses.  Varicella immunization.** / Consult your caregiver.  Hepatitis A immunization.** / Consult your caregiver. 2 doses, 6 to 18 months apart.  Hepatitis B immunization.** / Consult your caregiver. 3 doses usually over 6 months. Ages 40 to 64  Blood pressure check.** / Every 1 to 2 years.  Lipid and cholesterol check.** / Every 5 years beginning at age 20.  Clinical breast exam.** / Every year after age 40.  Mammogram.** / Every year beginning at age 40   and continuing for as long as you are in good health. Consult with your  caregiver.  Pap test.** / Every 3 years starting at age 30 through age 65 or 70 with a history of 3 consecutive normal Pap tests.  HPV screening.** / Every 3 years from ages 30 through ages 65 to 70 with a history of 3 consecutive normal Pap tests.  Fecal occult blood test (FOBT) of stool. / Every year beginning at age 50 and continuing until age 75. You may not need to do this test if you get a colonoscopy every 10 years.  Flexible sigmoidoscopy or colonoscopy.** / Every 5 years for a flexible sigmoidoscopy or every 10 years for a colonoscopy beginning at age 50 and continuing until age 75.  Hepatitis C blood test.** / For all people born from 1945 through 1965 and any individual with known risks for hepatitis C.  Skin self-exam. / Monthly.  Influenza immunization.** / Every year.  Pneumococcal polysaccharide immunization.** / 1 to 2 doses if you smoke cigarettes or if you have certain chronic medical conditions.  Tetanus, diphtheria, pertussis (Tdap, Td) immunization.** / A one-time dose of Tdap vaccine. After that, you need a Td booster dose every 10 years.  Measles, mumps, rubella (MMR) immunization. / You need at least 1 dose of MMR if you were born in 1957 or later. You may also need a second dose.  Varicella immunization.** / Consult your caregiver.  Meningococcal immunization.** / Consult your caregiver.  Hepatitis A immunization.** / Consult your caregiver. 2 doses, 6 to 18 months apart.  Hepatitis B immunization.** / Consult your caregiver. 3 doses, usually over 6 months. Ages 65 and over  Blood pressure check.** / Every 1 to 2 years.  Lipid and cholesterol check.** / Every 5 years beginning at age 20.  Clinical breast exam.** / Every year after age 40.  Mammogram.** / Every year beginning at age 40 and continuing for as long as you are in good health. Consult with your caregiver.  Pap test.** / Every 3 years starting at age 30 through age 65 or 70 with a 3  consecutive normal Pap tests. Testing can be stopped between 65 and 70 with 3 consecutive normal Pap tests and no abnormal Pap or HPV tests in the past 10 years.  HPV screening.** / Every 3 years from ages 30 through ages 65 or 70 with a history of 3 consecutive normal Pap tests. Testing can be stopped between 65 and 70 with 3 consecutive normal Pap tests and no abnormal Pap or HPV tests in the past 10 years.  Fecal occult blood test (FOBT) of stool. / Every year beginning at age 50 and continuing until age 75. You may not need to do this test if you get a colonoscopy every 10 years.  Flexible sigmoidoscopy or colonoscopy.** / Every 5 years for a flexible sigmoidoscopy or every 10 years for a colonoscopy beginning at age 50 and continuing until age 75.  Hepatitis C blood test.** / For all people born from 1945 through 1965 and any individual with known risks for hepatitis C.  Osteoporosis screening.** / A one-time screening for women ages 65 and over and women at risk for fractures or osteoporosis.  Skin self-exam. / Monthly.  Influenza immunization.** / Every year.  Pneumococcal polysaccharide immunization.** / 1 dose at age 65 (or older) if you have never been vaccinated.  Tetanus, diphtheria, pertussis (Tdap, Td) immunization. / A one-time dose of Tdap vaccine if you are over   61 and have contact with an infant, are a Dietitian, or simply want to be protected from whooping cough. After that, you need a Td booster dose every 10 years.  Varicella immunization.** / Consult your caregiver.  Meningococcal immunization.** / Consult your caregiver.  Hepatitis A immunization.** / Consult your caregiver. 2 doses, 6 to 18 months apart.  Hepatitis B immunization.** / Check with your caregiver. 3 doses, usually over 6 months. ** Family history and personal history of risk and conditions may change your caregiver's recommendations. Document Released: 03/29/2001 Document Revised: 04/25/2011  Document Reviewed: 06/28/2010 [facility] Patient Information 2014 [place], [place]. Hypertension As your heart beats, it forces blood through your arteries. This force is your blood pressure. If the pressure is too high, it is called hypertension (HTN) or high blood pressure. HTN is dangerous because you may have it and not know it. High blood pressure may mean that your heart has to work harder to pump blood. Your arteries may be narrow or stiff. The extra work puts you at risk for heart disease, stroke, and other problems.  Blood pressure consists of two numbers, a higher number over a lower, 110/72, for example. It is stated as "110 over 72." The ideal is below 120 for the top number (systolic) and under 80 for the bottom (diastolic). Write down your blood pressure today. You should pay close attention to your blood pressure if you have certain conditions such as:  Heart failure.  Prior heart attack.  Diabetes  Chronic kidney disease.  Prior stroke.  Multiple risk factors for heart disease. To see if you have HTN, your blood pressure should be measured while you are seated with your arm held at the level of the heart. It should be measured at least twice. A one-time elevated blood pressure reading (especially in the Emergency Department) does not mean that you need treatment. There may be conditions in which the blood pressure is different between your right and left arms. It is important to see your caregiver soon for a recheck. Most people have essential hypertension which means that there is not a specific cause. This type of high blood pressure may be lowered by changing lifestyle factors such as:  Stress.  Smoking.  Lack of exercise.  Excessive weight.  Drug/tobacco/alcohol use.  Eating less salt. Most people do not have symptoms from high blood pressure until it has caused damage to the body. Effective treatment can often prevent, delay or reduce that damage. TREATMENT  When a  cause has been identified, treatment for high blood pressure is directed at the cause. There are a large number of medications to treat HTN. These fall into several categories, and your caregiver will help you select the medicines that are best for you. Medications may have side effects. You should review side effects with your caregiver. If your blood pressure stays high after you have made lifestyle changes or started on medicines,   Your medication(s) may need to be changed.  Other problems may need to be addressed.  Be certain you understand your prescriptions, and know how and when to take your medicine.  Be sure to follow up with your caregiver within the time frame advised (usually within two weeks) to have your blood pressure rechecked and to review your medications.  If you are taking more than one medicine to lower your blood pressure, make sure you know how and at what times they should be taken. Taking two medicines at the same time can result  in blood pressure that is too low. SEEK IMMEDIATE MEDICAL CARE IF:  You develop a severe headache, blurred or changing vision, or confusion.  You have unusual weakness or numbness, or a faint feeling.  You have severe chest or abdominal pain, vomiting, or breathing problems. MAKE SURE YOU:   Understand these instructions.  Will watch your condition.  Will get help right away if you are not doing well or get worse. Document Released: 01/31/2005 Document Revised: 04/25/2011 Document Reviewed: 09/21/2007 [facility] Patient Information 2014 [place].

## 2012-09-10 ENCOUNTER — Encounter: Payer: Self-pay | Admitting: Internal Medicine

## 2012-09-10 NOTE — Assessment & Plan Note (Signed)
She will continue tramadol for the pain

## 2012-09-10 NOTE — Assessment & Plan Note (Signed)
Xray shows that there is a fracture in her foot but it is not in a location that would be treated with a cast or surgery

## 2012-09-10 NOTE — Assessment & Plan Note (Signed)
Her BP is not adequately controlled and she has some peripheral edema so I have added HCTZ to the Benicar Today I will check her lytes and renal function

## 2012-09-10 NOTE — Assessment & Plan Note (Signed)
Exam done Vaccines were reviewed and updated Labs ordered Pt ed material was given 

## 2012-09-13 ENCOUNTER — Telehealth: Payer: Self-pay | Admitting: Internal Medicine

## 2012-09-13 NOTE — Telephone Encounter (Signed)
Pt called stated that she received the letter concern about the test result. Pt is aware of the fracture but pt was wondering if there is anything we can do about this fracture. Please advise.

## 2012-09-13 NOTE — Telephone Encounter (Signed)
Returned call to patient x3// received voice prompt stating call cannot be completed. Closing phone note until pt calls back

## 2012-09-13 NOTE — Telephone Encounter (Signed)
We usually don't do much for toe fractures but I can refer her to ortho for evaluation if she wants that

## 2012-09-24 ENCOUNTER — Encounter: Payer: Self-pay | Admitting: Internal Medicine

## 2012-09-25 ENCOUNTER — Other Ambulatory Visit: Payer: Self-pay | Admitting: Internal Medicine

## 2012-09-25 DIAGNOSIS — Z1231 Encounter for screening mammogram for malignant neoplasm of breast: Secondary | ICD-10-CM | POA: Insufficient documentation

## 2012-09-30 ENCOUNTER — Other Ambulatory Visit: Payer: Self-pay | Admitting: Internal Medicine

## 2012-10-03 ENCOUNTER — Encounter: Payer: Self-pay | Admitting: Internal Medicine

## 2012-10-08 ENCOUNTER — Ambulatory Visit
Admission: RE | Admit: 2012-10-08 | Discharge: 2012-10-08 | Disposition: A | Discharge status: Home / Self Care | Payer: 59 | Source: Ambulatory Visit | Attending: Internal Medicine | Admitting: Internal Medicine

## 2012-10-08 DIAGNOSIS — Z1231 Encounter for screening mammogram for malignant neoplasm of breast: Secondary | ICD-10-CM

## 2012-10-08 IMAGING — MG STANDARD SCREENING - COMBOHD
8 of 12 series · 8 of 28 positions shown · non-contrast
Comparison: [DATE] from [facility].

CLINICAL DATA: Screening.

DIGITAL SCREENING BILATERAL MAMMOGRAM WITH CAD
DIGITAL BREAST TOMOSYNTHESIS
Digital breast tomosynthesis images are acquired in two
projections.  These images are reviewed in combination with the
digital mammogram, confirming the findings below.

[L MLO]
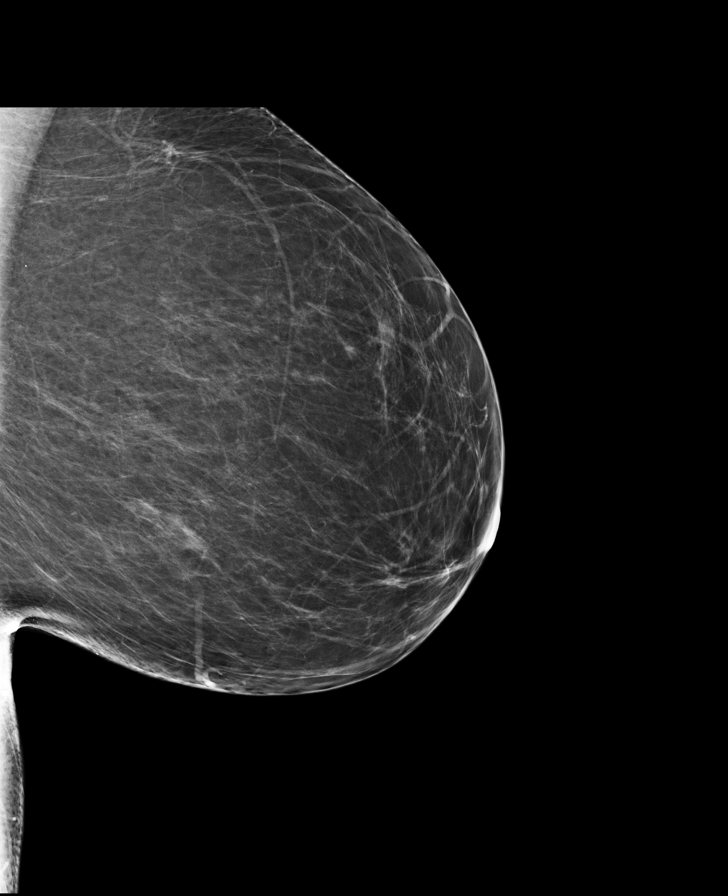

[R CC]
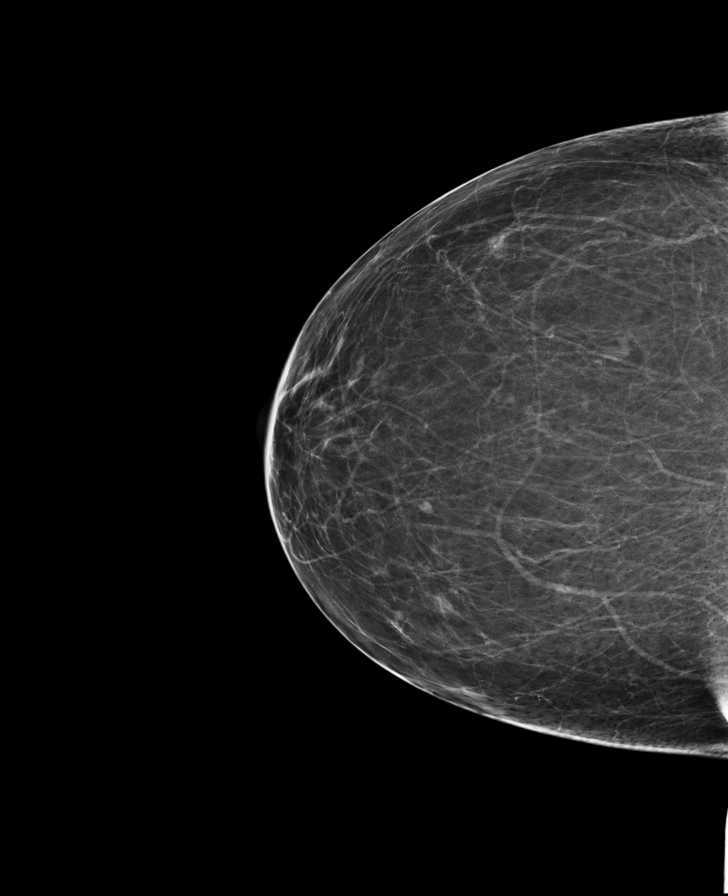

[R MLO]
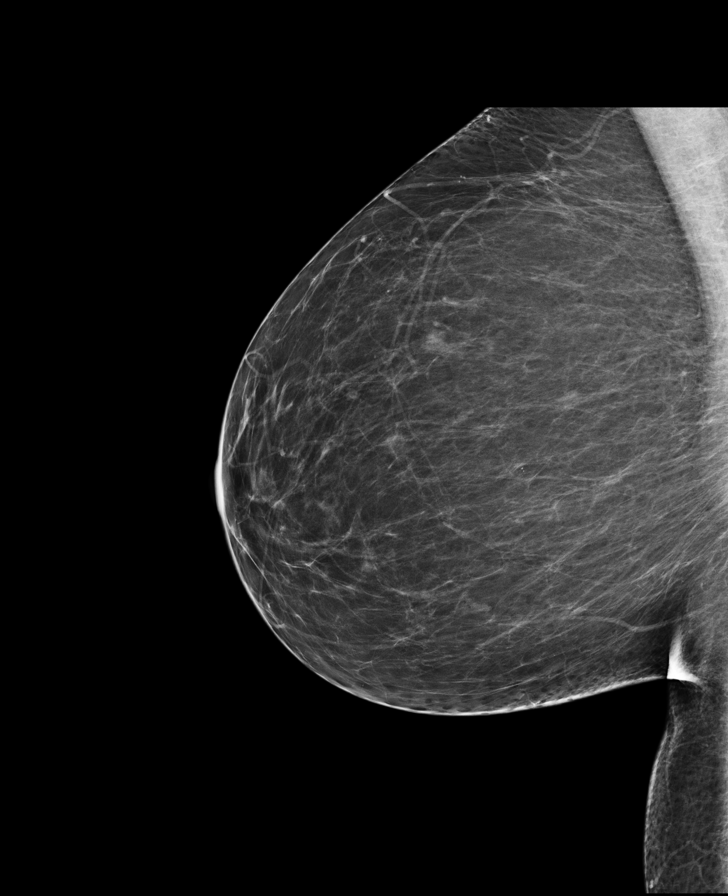

[L CC]
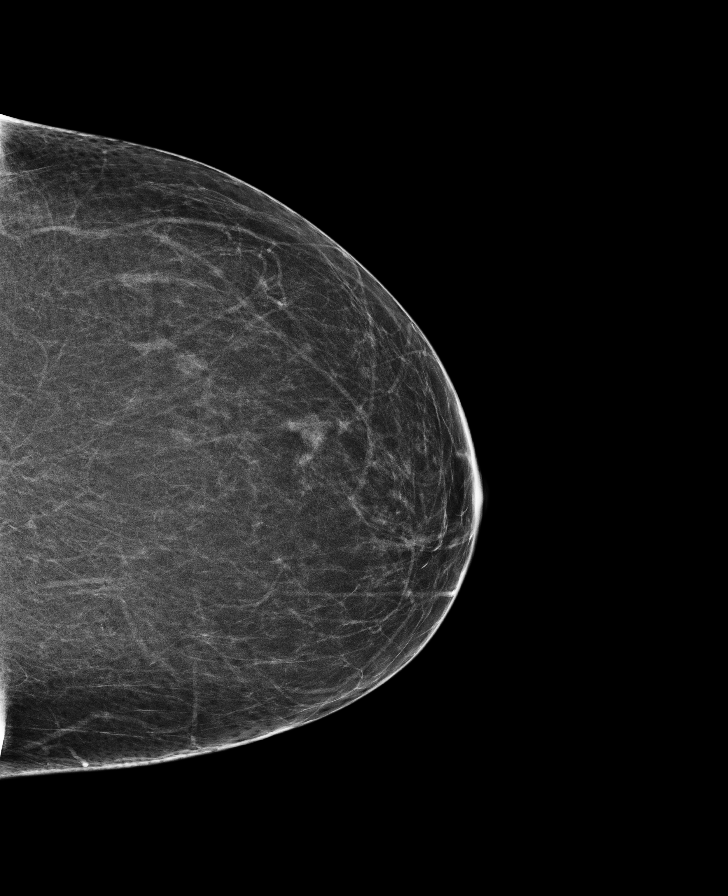

[R CC synth-2D]
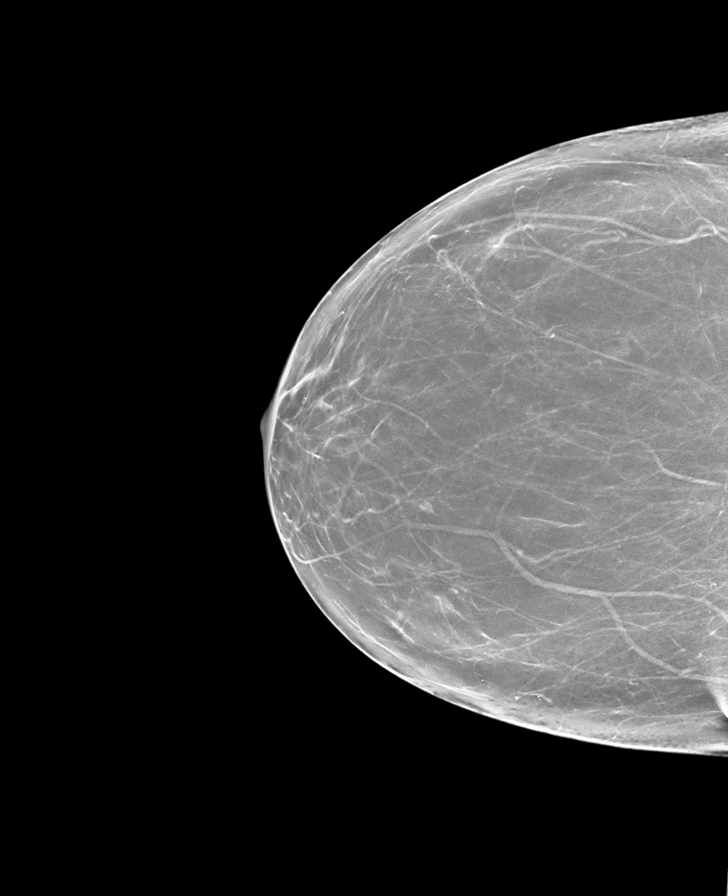

[R MLO synth-2D]
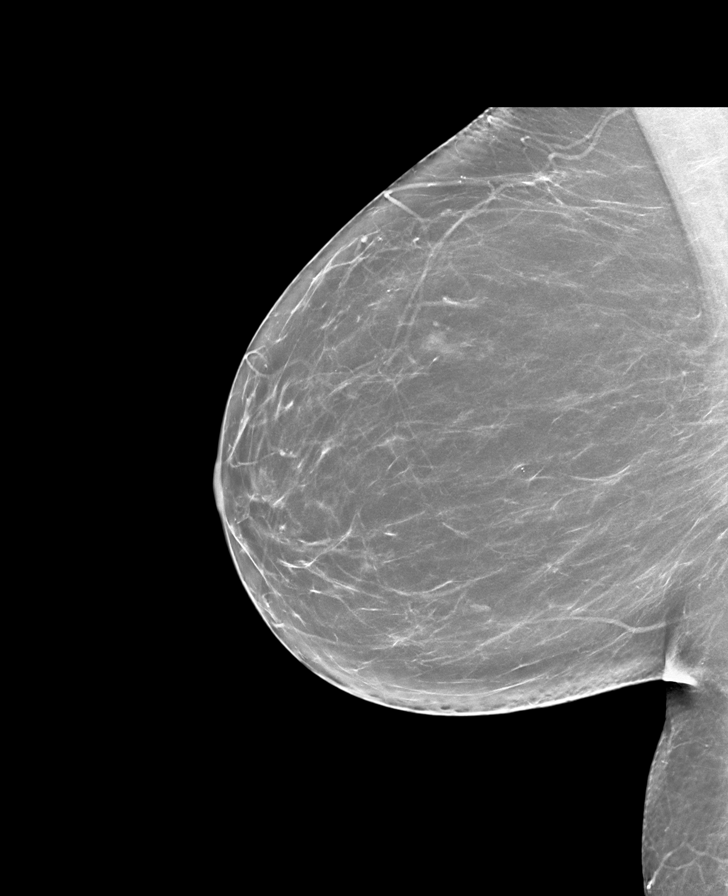

[L MLO synth-2D]
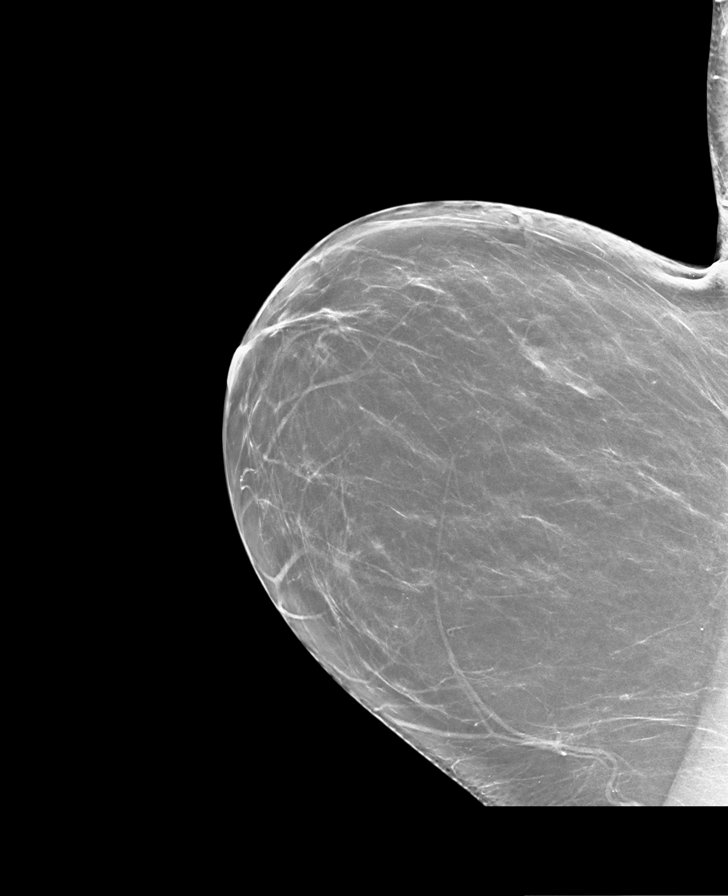

[L CC synth-2D]
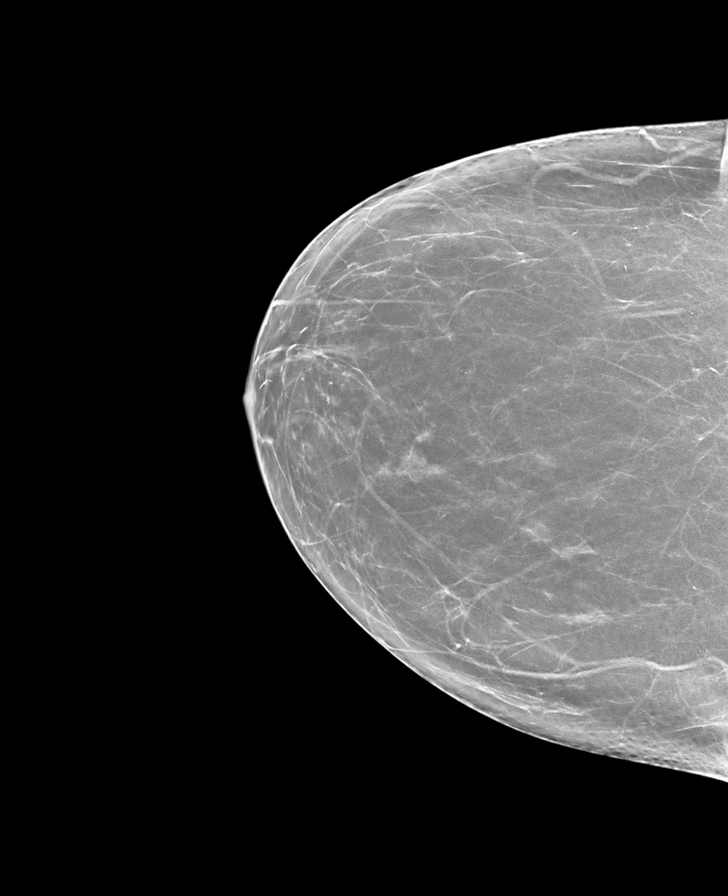

[8 of 28 positions shown; findings below may reference images not displayed]

FINDINGS: ACR Breast Density Category a:  The breast tissue is almost
entirely fatty.

There is no suspicious dominant mass, architectural distortion, or
calcification to suggest malignancy.

Images were processed with CAD.
IMPRESSION: No mammographic evidence of malignancy.

A result letter of this screening mammogram will be mailed directly
to the patient.

RECOMMENDATION:
Screening mammogram in one year. (Code:[TM])

BI-RADS CATEGORY 1:  Negative.

## 2012-10-10 LAB — HM MAMMOGRAPHY: HM Mammogram: NORMAL

## 2012-11-30 ENCOUNTER — Telehealth: Payer: Self-pay | Admitting: *Deleted

## 2012-11-30 ENCOUNTER — Other Ambulatory Visit: Payer: Self-pay | Admitting: Internal Medicine

## 2012-11-30 DIAGNOSIS — I10 Essential (primary) hypertension: Secondary | ICD-10-CM

## 2012-11-30 MED ORDER — LOSARTAN POTASSIUM-HCTZ 100-25 MG PO TABS: 1.0000 | ORAL_TABLET | Freq: Every day | ORAL | Status: DC

## 2012-11-30 NOTE — Telephone Encounter (Signed)
Left message Rx sent to pharmacy. 

## 2012-11-30 NOTE — Telephone Encounter (Signed)
Pt called states she doesn't believe the Benicar is effective, states she continues to have foot swelling and pain.  Please advise

## 2012-11-30 NOTE — Telephone Encounter (Signed)
Try hyzaar - the RX was sent to her pharmacy

## 2012-12-20 ENCOUNTER — Other Ambulatory Visit: Payer: Self-pay

## 2012-12-27 ENCOUNTER — Other Ambulatory Visit: Payer: Self-pay | Admitting: Internal Medicine

## 2013-01-08 ENCOUNTER — Telehealth: Payer: Self-pay | Admitting: *Deleted

## 2013-01-08 DIAGNOSIS — M179 Osteoarthritis of knee, unspecified: Secondary | ICD-10-CM

## 2013-01-08 DIAGNOSIS — M1710 Unilateral primary osteoarthritis, unspecified knee: Secondary | ICD-10-CM

## 2013-01-08 NOTE — Telephone Encounter (Signed)
Pt called states she is wanting to be referred to Dr. Gae Dry, Orthopedic, at [facility] due to possible knee replacement.  Pt further states she is taking Meloxicam once daily along with the Tramadol.  Please advise

## 2013-02-01 ENCOUNTER — Ambulatory Visit (INDEPENDENT_AMBULATORY_CARE_PROVIDER_SITE_OTHER): Payer: BC Managed Care – PPO | Admitting: Family Medicine

## 2013-02-01 ENCOUNTER — Encounter: Payer: Self-pay | Admitting: Family Medicine

## 2013-02-01 ENCOUNTER — Other Ambulatory Visit (INDEPENDENT_AMBULATORY_CARE_PROVIDER_SITE_OTHER): Payer: BC Managed Care – PPO

## 2013-02-01 VITALS — BP 130/72 | HR 85

## 2013-02-01 DIAGNOSIS — M25569 Pain in unspecified knee: Secondary | ICD-10-CM

## 2013-02-01 DIAGNOSIS — IMO0002 Reserved for concepts with insufficient information to code with codable children: Secondary | ICD-10-CM

## 2013-02-01 DIAGNOSIS — M1710 Unilateral primary osteoarthritis, unspecified knee: Secondary | ICD-10-CM

## 2013-02-01 DIAGNOSIS — M25561 Pain in right knee: Secondary | ICD-10-CM

## 2013-02-01 DIAGNOSIS — M179 Osteoarthritis of knee, unspecified: Secondary | ICD-10-CM

## 2013-02-01 NOTE — Assessment & Plan Note (Signed)
X-rays were reviewed by me today. Patient does have end-stage bone-on-bone' changes of the lateral compartment bilaterally with moderate changes of the patellofemoral and medial joint space. Patient did have injections today because she is going out-of-town in the next month and would like to be able to ambulate. Patient had improvement in pain. Patient was given other over-the-counter medications that could be beneficial for arthritis pain. I am in agreement the patient does need a knee replacement surgery in the near future. Patient will followup with a orthopedic surgeon next month.

## 2013-02-01 NOTE — Progress Notes (Signed)
I'm seeing this patient by the request  of:  Kelsey Calico, MD   CC: Knee pain  HPI: Patient is a very pleasant 62 year old female coming in with a complaint of knee pain. Patient does have a known history of arthritis. Patient has been seen by other providers for this knee before and has had a knee arthroscopic procedure in 1992. Patient since that time has had injections in the knees bilaterally. Patient has seen multiple different orthopedic physicians and has decided that she does think it is time to have knee replacement. She's been told this for approximately the last 6 years. Patient has brought x-rays with her today. Patient states that the pain is bilaterally right greater than left. Most of the lateral aspect of both knees. Patient states it is starting to affect her ambulation as well. Patient has tried multiple things in the past including meloxicam and tramadol with now only minimal benefit. Patient is scheduled with an orthopedic physician in one month and we'll be discussing knee replacement. Patient states that the pain is now 9/10 and can wake her up at night. :  Past medical, surgical, family and social history reviewed. Medications reviewed all in the electronic medical record.   Review of Systems: No headache, visual changes, nausea, vomiting, diarrhea, constipation, dizziness, abdominal pain, skin rash, fevers, chills, night sweats, weight loss, swollen lymph nodes, body aches, joint swelling, muscle aches, chest pain, shortness of breath, mood changes.   Objective:    Blood pressure 130/72, pulse 85, SpO2 96.00%.   General: No apparent distress alert and oriented x3 mood and affect normal, dressed appropriately.  HEENT: Pupils equal, extraocular movements intact Respiratory: Patient's speak in full sentences and does not appear short of breath Cardiovascular: No lower extremity edema, non tender, no erythema Skin: Warm dry intact with no signs of infection or rash on  extremities or on axial skeleton. Abdomen: Soft nontender Neuro: Cranial nerves II through XII are intact, neurovascularly intact in all extremities with 2+ DTRs and 2+ pulses. Lymph: No lymphadenopathy of posterior or anterior cervical chain or axillae bilaterally.  Gait significant varus deformity while walking MSK: Non tender with full range of motion and good stability and symmetric strength and tone of shoulders, elbows, wrist, hip and ankles bilaterally.  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 Severe lateral joint line tenderness bilaterally Patient does have decreased range of motion lacking the last 10 of extension as well as 10 of flexion bilaterally Ligaments with solid consistent endpoints including ACL, PCL, LCL, MCL. Severe crepitus noted on range of motion  Patellar and quadriceps tendons unremarkable. Hamstring and quadriceps strength is normal.    MSK US performed of: Right knee This study was ordered, performed, and interpreted by Charlann Boxer D.O.  Knee: All structures visualized. Patient does have severe osteophytic changes of the lateral joint space with remarkably no joint space bilaterally. Patient does have moderate osteophytic changes of the patellofemoral joint in the medial joint line as well. 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 Right knee was prepped with alcohol swab and utilizing anterolateral approach, patient's right knee space was injected with 4:1  marcaine 0.5%: Kenalog 40mg /dL. Patient tolerated the procedure well without immediate complications.  After informed written and verbal consent, patient was seated on exam table. Left knee was prepped with  alcohol swab and utilizing anterolateral approach, patient's left knee  space was injected with 4:1  marcaine 0.5%: Kenalog 40mg /dL. Patient tolerated the procedure well without immediate complications.   Impression and Recommendations:     This case required medical decision making of moderate complexity.

## 2013-02-01 NOTE — Progress Notes (Signed)
Pre-visit discussion using our clinic review tool. No additional management support is needed unless otherwise documented below in the visit note.  

## 2013-02-01 NOTE — Patient Instructions (Signed)
Very nice to meet you Try the pennsaid twice daily Tramadol 3 times a day Take tylenol 650 mg three times a day is the best evidence based medicine we have for arthritis.  Glucosamine sulfate 750mg  twice a day is a supplement that has been shown to help moderate to severe arthritis. Vitamin D 100 IU daily Fish oil 2 grams daily.  Tumeric 500mg  twice daily.  Capsaicin topically up to four times a day may also help with pain. Cortisone injections are an option if these interventions do not seem to make a difference or need more relief.  If cortisone injections do not help, there are different types of shots that may help but they take longer to take effect.  We can discuss this at follow up.  It's important that you continue to stay active. Controlling your weight is important.  Consider physical therapy to strengthen muscles around the joint that hurts to take pressure off of the joint itself. Shoe inserts with good arch support may be helpful.  [employer] orth[facility] at [employer] could help.  Water aerobics and cycling with low resistance are the best two types of exercise for arthritis. Dr. Maureen Ralphs is great good luck

## 2013-05-06 ENCOUNTER — Telehealth: Payer: Self-pay

## 2013-05-06 MED ORDER — TRAMADOL HCL 50 MG PO TABS: ORAL_TABLET | ORAL | Status: DC

## 2013-05-06 NOTE — Telephone Encounter (Signed)
RX called into pharmacy per MD approval.

## 2013-05-06 NOTE — Telephone Encounter (Signed)
OK to fill this prescription with additional refills x0 Thank you!  

## 2013-05-06 NOTE — Telephone Encounter (Signed)
Received refill request from [facility] request refills for ultram 50 mg . Rx last written 09/30/12 #[id number]  and pt last seen 09/06/12. Please advise Thanks

## 2013-05-15 ENCOUNTER — Other Ambulatory Visit: Payer: Self-pay

## 2013-05-15 MED ORDER — CITALOPRAM HYDROBROMIDE 20 MG PO TABS: 20.0000 mg | ORAL_TABLET | Freq: Every day | ORAL | Status: DC

## 2013-05-16 ENCOUNTER — Other Ambulatory Visit: Payer: Self-pay | Admitting: Orthopedic Surgery

## 2013-05-16 ENCOUNTER — Encounter: Payer: Self-pay | Admitting: Internal Medicine

## 2013-05-16 ENCOUNTER — Ambulatory Visit (INDEPENDENT_AMBULATORY_CARE_PROVIDER_SITE_OTHER): Payer: BC Managed Care – PPO | Admitting: Internal Medicine

## 2013-05-16 VITALS — BP 110/78 | HR 85 | Temp 98.0°F | Resp 16 | Ht 66.0 in | Wt 185.0 lb

## 2013-05-16 DIAGNOSIS — I10 Essential (primary) hypertension: Secondary | ICD-10-CM

## 2013-05-16 DIAGNOSIS — Z23 Encounter for immunization: Secondary | ICD-10-CM

## 2013-05-16 NOTE — Assessment & Plan Note (Signed)
Her BP is well controlled 

## 2013-05-16 NOTE — Patient Instructions (Signed)

## 2013-05-16 NOTE — Progress Notes (Signed)
   Subjective:    Patient ID: Kelsey Church, female    DOB: 1950-03-17, 63 y.o.   MRN: [id number]  Hypertension This is a chronic problem. The current episode started more than 1 year ago. The problem has been gradually improving since onset. The problem is controlled. Pertinent negatives include no anxiety, blurred vision, chest pain, headaches, malaise/fatigue, neck pain, orthopnea, palpitations, peripheral edema, PND, shortness of breath or sweats. Agents associated with hypertension include NSAIDs. Past treatments include angiotensin blockers and diuretics. The current treatment provides significant improvement. There are no compliance problems.       Review of Systems  Constitutional: Negative.  Negative for fever, chills, malaise/fatigue, diaphoresis, appetite change and fatigue.  HENT: Negative.   Eyes: Negative.  Negative for blurred vision.  Respiratory: Negative.  Negative for cough, chest tightness, shortness of breath, wheezing and stridor.   Cardiovascular: Negative.  Negative for chest pain, palpitations, orthopnea, leg swelling and PND.  Gastrointestinal: Negative.  Negative for nausea, vomiting, abdominal pain, diarrhea, constipation and blood in stool.  Endocrine: Negative.   Genitourinary: Negative.   Musculoskeletal: Positive for arthralgias (knees). Negative for back pain, gait problem, joint swelling, myalgias, neck pain and neck stiffness.  Skin: Negative.   Allergic/Immunologic: Negative.   Neurological: Negative.  Negative for headaches.  Hematological: Negative.  Negative for adenopathy. Does not bruise/bleed easily.  Psychiatric/Behavioral: Negative.        Objective:   Physical Exam  Vitals reviewed. Constitutional: She is oriented to person, place, and time. She appears well-developed and well-nourished. No distress.  HENT:  Head: Normocephalic and atraumatic.  Mouth/Throat: Oropharynx is clear and moist. No oropharyngeal exudate.  Eyes: Conjunctivae  are normal. Right eye exhibits no discharge. Left eye exhibits no discharge. No scleral icterus.  Neck: Normal range of motion. Neck supple. No JVD present. No tracheal deviation present. No thyromegaly present.  Cardiovascular: Normal rate, regular rhythm, normal heart sounds and intact distal pulses.  Exam reveals no gallop and no friction rub.   No murmur heard. Pulmonary/Chest: Effort normal and breath sounds normal. No stridor. No respiratory distress. She has no wheezes. She has no rales. She exhibits no tenderness.  Abdominal: Soft. Bowel sounds are normal. She exhibits no distension and no mass. There is no tenderness. There is no rebound and no guarding.  Musculoskeletal: Normal range of motion. She exhibits edema (trace pitting edema in BLE). She exhibits no tenderness.  Lymphadenopathy:    She has no cervical adenopathy.  Neurological: She is oriented to person, place, and time.  Skin: Skin is warm and dry. No rash noted. She is not diaphoretic. No erythema. No pallor.  Psychiatric: She has a normal mood and affect. Her behavior is normal. Judgment and thought content normal.     Lab Results  Component Value Date   WBC 8.8 09/06/2012   HGB 14.3 09/06/2012   HCT 41.8 09/06/2012   PLT 258.0 09/06/2012   GLUCOSE 83 09/06/2012   CHOL 179 09/06/2012   TRIG 117.0 09/06/2012   HDL 65.20 09/06/2012   LDLCALC 90 09/06/2012   ALT 18 09/06/2012   AST 15 09/06/2012   NA 139 09/06/2012   K 4.2 09/06/2012   CL 104 09/06/2012   CREATININE 0.9 09/06/2012   BUN 20 09/06/2012   CO2 28 09/06/2012   TSH 0.99 09/06/2012       Assessment & Plan:

## 2013-05-20 ENCOUNTER — Telehealth: Payer: Self-pay | Admitting: Internal Medicine

## 2013-05-20 NOTE — Telephone Encounter (Signed)
Relevant patient education assigned to patient using Emmi. ° °

## 2013-06-05 ENCOUNTER — Other Ambulatory Visit (HOSPITAL_COMMUNITY): Payer: Self-pay | Admitting: Orthopedic Surgery

## 2013-06-05 NOTE — Progress Notes (Signed)
Clearance Dr Ronnald Ramp on chart, and  LOV 4/15 , and LOV Dr Elsworth Soho 3/

## 2013-06-05 NOTE — Patient Instructions (Signed)
Your procedure is scheduled on:  06/10/13  MONDAY  Report to [facility] at  [place].  Call this number if you have problems the morning of surgery: [place]   Do not eat food  Or drink :After Midnight.SUNDAY NIGHT   Take these medicines the morning of surgery with A SIP OF WATER:NO REGULAR MEDS MAY TAKE TRAMADOL IF NEEDED   .  Contacts, dentures or partial plates, or metal hairpins  can not be worn to surgery. Your family will be responsible for glasses, dentures, hearing aides while you are in surgery  Leave suitcase in the car. After surgery it may be brought to your room.  For patients admitted to the hospital, checkout time is 11:00 AM day of  discharge.                DO NOT WEAR JEWELRY, LOTIONS, POWDERS, OR PERFUMES.  WOMEN-- DO NOT SHAVE LEGS OR UNDERARMS FOR 48 HOURS BEFORE SHOWERS. MEN MAY SHAVE FACE.  Patients discharged the day of surgery will not be allowed to drive home. IF going home the day of surgery, you must have a driver and someone to stay with you for the first 24 hours                                                                                                                                        - Preparing for Surgery Before surgery, you can play an important role.  Because skin is not sterile, your skin needs to be as free of germs as possible.  You can reduce the number of germs on your skin by washing with CHG (chlorahexidine gluconate) soap before surgery.  CHG is an antiseptic cleaner which kills germs and bonds with the skin to continue killing germs even after washing. Please DO NOT use if you have an allergy to CHG or antibacterial soaps.  If your skin becomes reddened/irritated stop using the CHG and inform your nurse when you arrive at Short Stay. Do not shave (including legs and underarms) for at least 48 hours prior to the first CHG shower.  You may shave  your face. Please follow these instructions carefully:  1.  Shower with CHG Soap the night before surgery and the  morning of Surgery.  2.  If you choose to wash your hair, wash your hair first as usual with your  normal  shampoo.  3.  After you shampoo, rinse your hair and body thoroughly to remove the  shampoo.                           4.  Use CHG as you would any other liquid soap.  You can apply  chg directly  to the skin and wash                       Gently with a scrungie or clean washcloth.  5.  Apply the CHG Soap to your body ONLY FROM THE NECK DOWN.   Do not use on open                           Wound or open sores. Avoid contact with eyes, ears mouth and genitals (private parts).                        Genitals (private parts) with your normal soap.             6.  Wash thoroughly, paying special attention to the area where your surgery  will be performed.  7.  Thoroughly rinse your body with warm water from the neck down.  8.  DO NOT shower/wash with your normal soap after using and rinsing off  the CHG Soap.                9.  Pat yourself dry with a clean towel.            10.  Wear clean pajamas.            11.  Place clean sheets on your bed the night of your first shower and do not  sleep with pets. Day of Surgery : Do not apply any lotions/deodorants the morning of surgery.  Please wear clean clothes to the hospital/surgery center.  FAILURE TO FOLLOW THESE INSTRUCTIONS MAY RESULT IN THE CANCELLATION OF YOUR SURGERY PATIENT SIGNATURE_________________________________  NURSE SIGNATURE__________________________________   Incentive Spirometer  An incentive spirometer is a tool that can help keep your lungs clear and active. This tool measures how well you are filling your lungs with each breath. Taking long deep breaths may help reverse or decrease the chance of developing breathing (pulmonary) problems (especially infection) following:  A long period of time when you are  unable to move or be active. BEFORE THE PROCEDURE   If the spirometer includes an indicator to show your best effort, your nurse or respiratory therapist will set it to a desired goal.  If possible, sit up straight or lean slightly forward. Try not to slouch.  Hold the incentive spirometer in an upright position. INSTRUCTIONS FOR USE  1. Sit on the edge of your bed if possible, or sit up as far as you can in bed or on a chair. 2. Hold the incentive spirometer in an upright position. 3. Breathe out normally. 4. Place the mouthpiece in your mouth and seal your lips tightly around it. 5. Breathe in slowly and as deeply as possible, raising the piston or the ball toward the top of the column. 6. Hold your breath for 3-5 seconds or for as long as possible. Allow the piston or ball to fall to the bottom of the column. 7. Remove the mouthpiece from your mouth and breathe out normally. 8. Rest for a few seconds and repeat Steps 1 through 7 at least 10 times every 1-2 hours when you are awake. Take your time and take a few normal breaths between deep breaths. 9. The spirometer may include an indicator to show your best effort. Use the indicator as a goal to work toward during each repetition. 10. After each set of  10 deep breaths, practice coughing to be sure your lungs are clear. If you have an incision (the cut made at the time of surgery), support your incision when coughing by placing a pillow or rolled up towels firmly against it. Once you are able to get out of bed, walk around indoors and cough well. You may stop using the incentive spirometer when instructed by your caregiver.  RISKS AND COMPLICATIONS  Take your time so you do not get dizzy or light-headed.  If you are in pain, you may need to take or ask for pain medication before doing incentive spirometry. It is harder to take a deep breath if you are having pain. AFTER USE  Rest and breathe slowly and easily.  It can be helpful to  keep track of a log of your progress. Your caregiver can provide you with a simple table to help with this. If you are using the spirometer at home, follow these instructions: [place] IF:   You are having difficultly using the spirometer.  You have trouble using the spirometer as often as instructed.  Your pain medication is not giving enough relief while using the spirometer.  You develop fever of 100.5 F (38.1 C) or higher. SEEK IMMEDIATE MEDICAL CARE IF:   You cough up bloody sputum that had not been present before.  You develop fever of 102 F (38.9 C) or greater.  You develop worsening pain at or near the incision site. MAKE SURE YOU:   Understand these instructions.  Will watch your condition.  Will get help right away if you are not doing well or get worse. Document Released: 06/13/2006 Document Revised: 04/25/2011 Document Reviewed: 08/14/2006 [facility] Patient Information 2014 [place], [place].   WHAT IS A BLOOD TRANSFUSION? Blood Transfusion Information  A transfusion is the replacement of blood or some of its parts. Blood is made up of multiple cells which provide different functions.  Red blood cells carry oxygen and are used for blood loss replacement.  White blood cells fight against infection.  Platelets control bleeding.  Plasma helps clot blood.  Other blood products are available for specialized needs, such as hemophilia or other clotting disorders. BEFORE THE TRANSFUSION  Who gives blood for transfusions?   Healthy volunteers who are fully evaluated to make sure their blood is safe. This is blood bank blood. Transfusion therapy is the safest it has ever been in the practice of medicine. Before blood is taken from a donor, a complete history is taken to make sure that person has no history of diseases nor engages in risky social behavior (examples are intravenous drug use or sexual activity with multiple partners). The donor's travel history  is screened to minimize risk of transmitting infections, such as malaria. The donated blood is tested for signs of infectious diseases, such as HIV and hepatitis. The blood is then tested to be sure it is compatible with you in order to minimize the chance of a transfusion reaction. If you or a relative donates blood, this is often done in anticipation of surgery and is not appropriate for emergency situations. It takes many days to process the donated blood. RISKS AND COMPLICATIONS Although transfusion therapy is very safe and saves many lives, the main dangers of transfusion include:   Getting an infectious disease.  Developing a transfusion reaction. This is an allergic reaction to something in the blood you were given. Every precaution is taken to prevent this. The decision to have a blood transfusion has  been considered carefully by your caregiver before blood is given. Blood is not given unless the benefits outweigh the risks. AFTER THE TRANSFUSION  Right after receiving a blood transfusion, you will usually feel much better and more energetic. This is especially true if your red blood cells have gotten low (anemic). The transfusion raises the level of the red blood cells which carry oxygen, and this usually causes an energy increase.  The nurse administering the transfusion will monitor you carefully for complications. HOME CARE INSTRUCTIONS  No special instructions are needed after a transfusion. You may find your energy is better. Speak with your caregiver about any limitations on activity for underlying diseases you may have. SEEK MEDICAL CARE IF:   Your condition is not improving after your transfusion.  You develop redness or irritation at the intravenous (IV) site. SEEK IMMEDIATE MEDICAL CARE IF:  Any of the following symptoms occur over the next 12 hours:  Shaking chills.  You have a temperature by mouth above 102 F (38.9 C), not controlled by medicine.  Chest, back, or  muscle pain.  People around you feel you are not acting correctly or are confused.  Shortness of breath or difficulty breathing.  Dizziness and fainting.  You get a rash or develop hives.  You have a decrease in urine output.  Your urine turns a dark color or changes to pink, red, or brown. Any of the following symptoms occur over the next 10 days:  You have a temperature by mouth above 102 F (38.9 C), not controlled by medicine.  Shortness of breath.  Weakness after normal activity.  The white part of the eye turns yellow (jaundice).  You have a decrease in the amount of urine or are urinating less often.  Your urine turns a dark color or changes to pink, red, or brown. Document Released: 01/29/2000 Document Revised: 04/25/2011 Document Reviewed: 09/17/2007 [facility] Patient Information 2014 [place].

## 2013-06-06 ENCOUNTER — Encounter (HOSPITAL_COMMUNITY)
Admission: RE | Admit: 2013-06-06 | Discharge: 2013-06-06 | Disposition: A | Discharge status: Home / Self Care | Payer: BC Managed Care – PPO | Source: Ambulatory Visit | Attending: Orthopedic Surgery | Admitting: Orthopedic Surgery

## 2013-06-06 ENCOUNTER — Encounter (HOSPITAL_COMMUNITY): Payer: Self-pay | Admitting: Pharmacy Technician

## 2013-06-06 ENCOUNTER — Encounter (HOSPITAL_COMMUNITY): Payer: Self-pay

## 2013-06-06 ENCOUNTER — Ambulatory Visit (HOSPITAL_COMMUNITY)
Admission: RE | Admit: 2013-06-06 | Discharge: 2013-06-06 | Disposition: A | Discharge status: Home / Self Care | Payer: BC Managed Care – PPO | Source: Ambulatory Visit | Attending: Anesthesiology | Admitting: Anesthesiology

## 2013-06-06 DIAGNOSIS — Z01818 Encounter for other preprocedural examination: Secondary | ICD-10-CM | POA: Insufficient documentation

## 2013-06-06 DIAGNOSIS — Z9089 Acquired absence of other organs: Secondary | ICD-10-CM | POA: Insufficient documentation

## 2013-06-06 DIAGNOSIS — I10 Essential (primary) hypertension: Secondary | ICD-10-CM | POA: Insufficient documentation

## 2013-06-06 DIAGNOSIS — Z01810 Encounter for preprocedural cardiovascular examination: Secondary | ICD-10-CM | POA: Insufficient documentation

## 2013-06-06 DIAGNOSIS — Z01812 Encounter for preprocedural laboratory examination: Secondary | ICD-10-CM | POA: Insufficient documentation

## 2013-06-06 HISTORY — DX: Depression, unspecified: F32.A

## 2013-06-06 HISTORY — DX: Adverse effect of unspecified anesthetic, initial encounter: T41.45XA

## 2013-06-06 HISTORY — DX: Other complications of anesthesia, initial encounter: T88.59XA

## 2013-06-06 HISTORY — DX: Major depressive disorder, single episode, unspecified: F32.9

## 2013-06-06 HISTORY — DX: Essential (primary) hypertension: I10

## 2013-06-06 LAB — URINALYSIS, ROUTINE W REFLEX MICROSCOPIC
BILIRUBIN URINE: NEGATIVE
GLUCOSE, UA: NEGATIVE mg/dL
Hgb urine dipstick: NEGATIVE
Ketones, ur: NEGATIVE mg/dL
Leukocytes, UA: NEGATIVE
Nitrite: NEGATIVE
PH: 7.5 (ref 5.0–8.0)
Protein, ur: NEGATIVE mg/dL
Specific Gravity, Urine: 1.017 (ref 1.005–1.030)
Urobilinogen, UA: 1 mg/dL (ref 0.0–1.0)

## 2013-06-06 LAB — CBC
HEMATOCRIT: 43.7 % (ref 36.0–46.0)
Hemoglobin: 14.6 g/dL (ref 12.0–15.0)
MCH: 30.3 pg (ref 26.0–34.0)
MCHC: 33.4 g/dL (ref 30.0–36.0)
MCV: 90.7 fL (ref 78.0–100.0)
Platelets: 239 10*3/uL (ref 150–400)
RBC: 4.82 MIL/uL (ref 3.87–5.11)
RDW: 12.9 % (ref 11.5–15.5)
WBC: 7.3 10*3/uL (ref 4.0–10.5)

## 2013-06-06 LAB — APTT: aPTT: 29 seconds (ref 24–37)

## 2013-06-06 LAB — COMPREHENSIVE METABOLIC PANEL
ALBUMIN: 4.3 g/dL (ref 3.5–5.2)
ALT: 18 U/L (ref 0–35)
AST: 15 U/L (ref 0–37)
Alkaline Phosphatase: 84 U/L (ref 39–117)
BILIRUBIN TOTAL: 0.4 mg/dL (ref 0.3–1.2)
BUN: 29 mg/dL — AB (ref 6–23)
CHLORIDE: 100 meq/L (ref 96–112)
CO2: 31 mEq/L (ref 19–32)
CREATININE: 0.92 mg/dL (ref 0.50–1.10)
Calcium: 10.1 mg/dL (ref 8.4–10.5)
GFR, EST AFRICAN AMERICAN: 76 mL/min — AB (ref >90)
GFR, EST NON AFRICAN AMERICAN: 65 mL/min — AB (ref >90)
GLUCOSE: 89 mg/dL (ref 70–99)
Potassium: 4.2 mEq/L (ref 3.7–5.3)
Sodium: 143 mEq/L (ref 137–147)
Total Protein: 7.6 g/dL (ref 6.0–8.3)

## 2013-06-06 LAB — SURGICAL PCR SCREEN
MRSA, PCR: NEGATIVE
Staphylococcus aureus: POSITIVE — AB

## 2013-06-06 LAB — ABO/RH: ABO/RH(D): B POS

## 2013-06-06 LAB — PROTIME-INR
INR: 0.93 (ref 0.00–1.49)
PROTHROMBIN TIME: 12.3 s (ref 11.6–15.2)

## 2013-06-06 IMAGING — CR DG CHEST 2V
2 series · 2 of 2 positions shown · non-contrast
Comparison: None.

CLINICAL DATA: Preoperative examination (right total knee
replacement), history of hypertension, nonsmoker

EXAM:
CHEST  2 VIEW

[w chest pa]
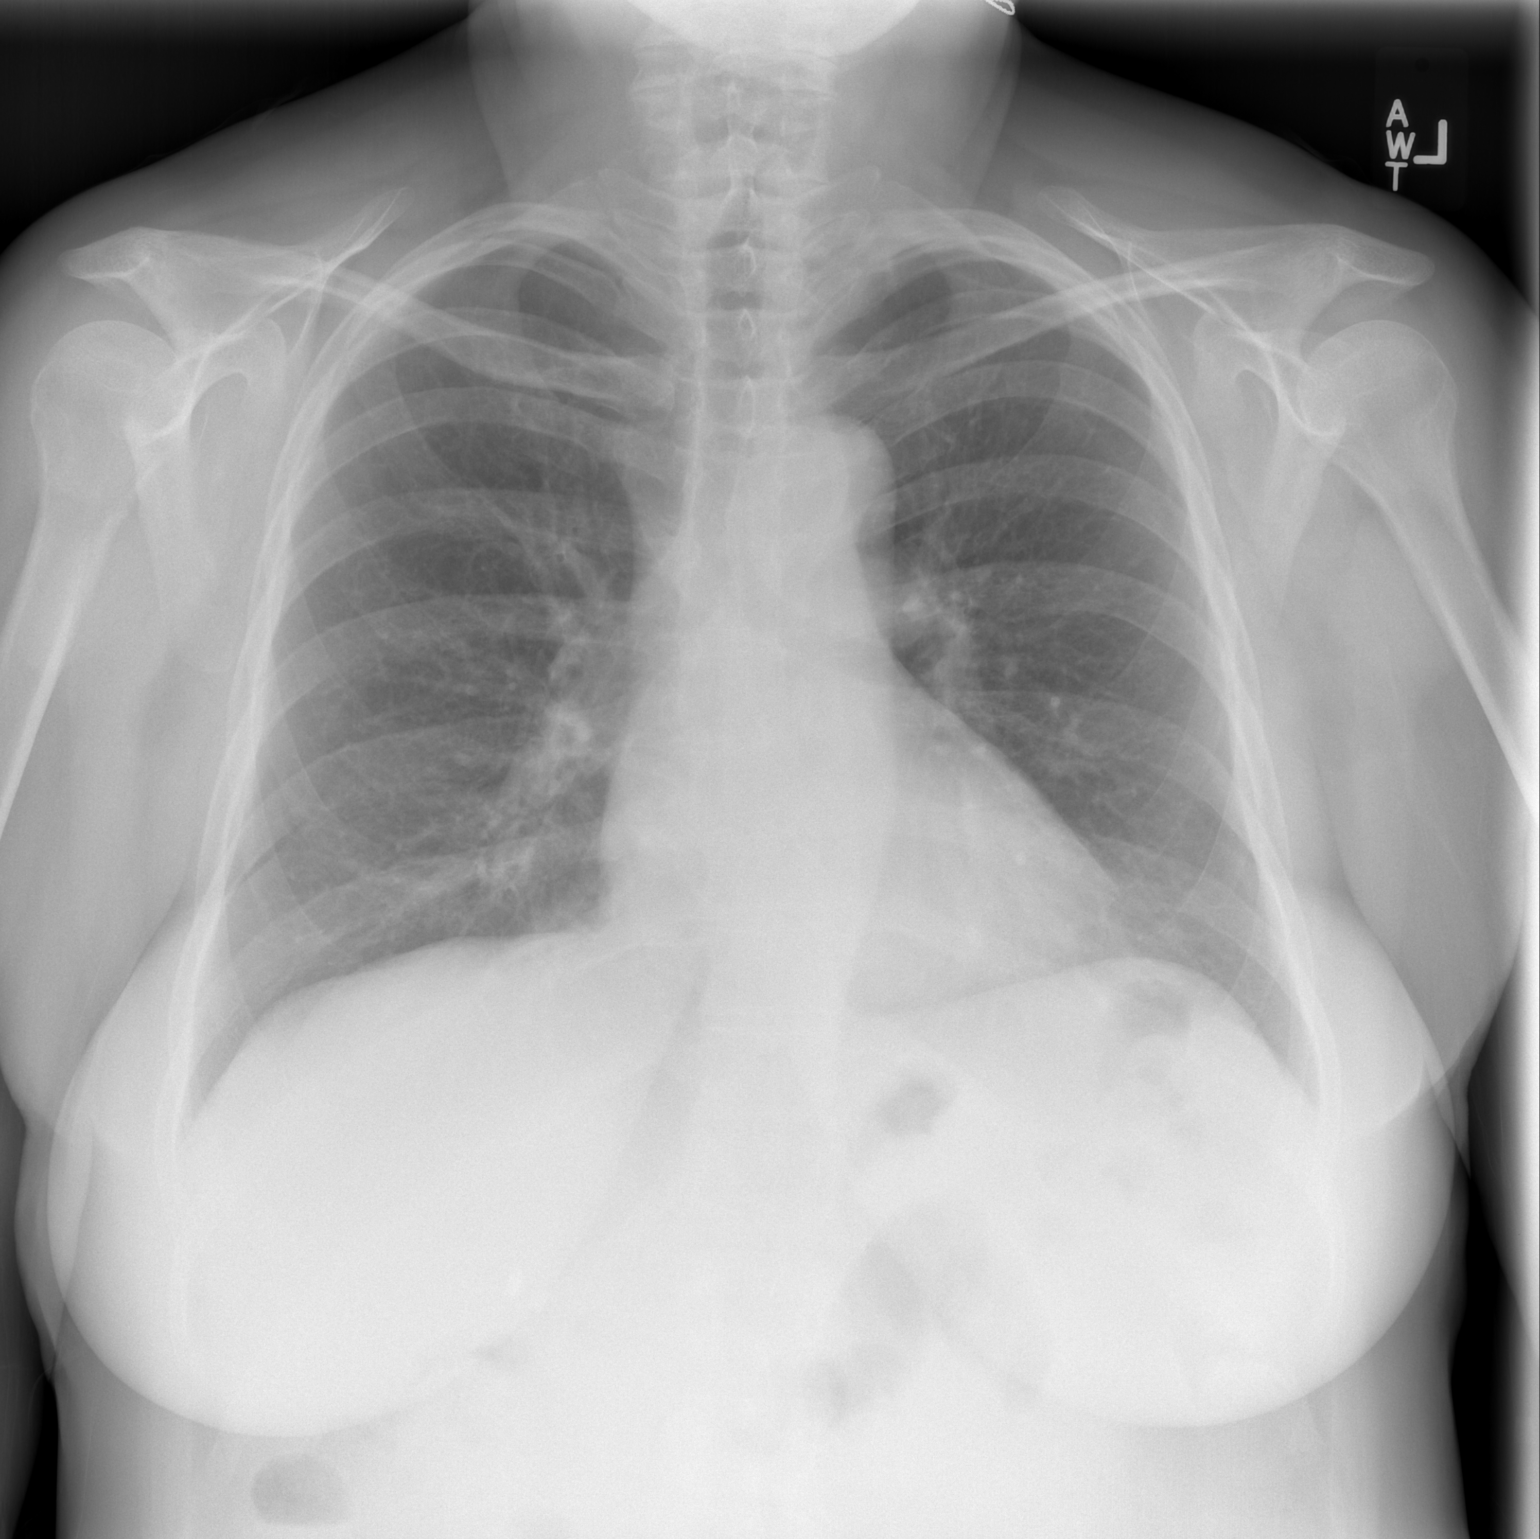

[w chest lat]
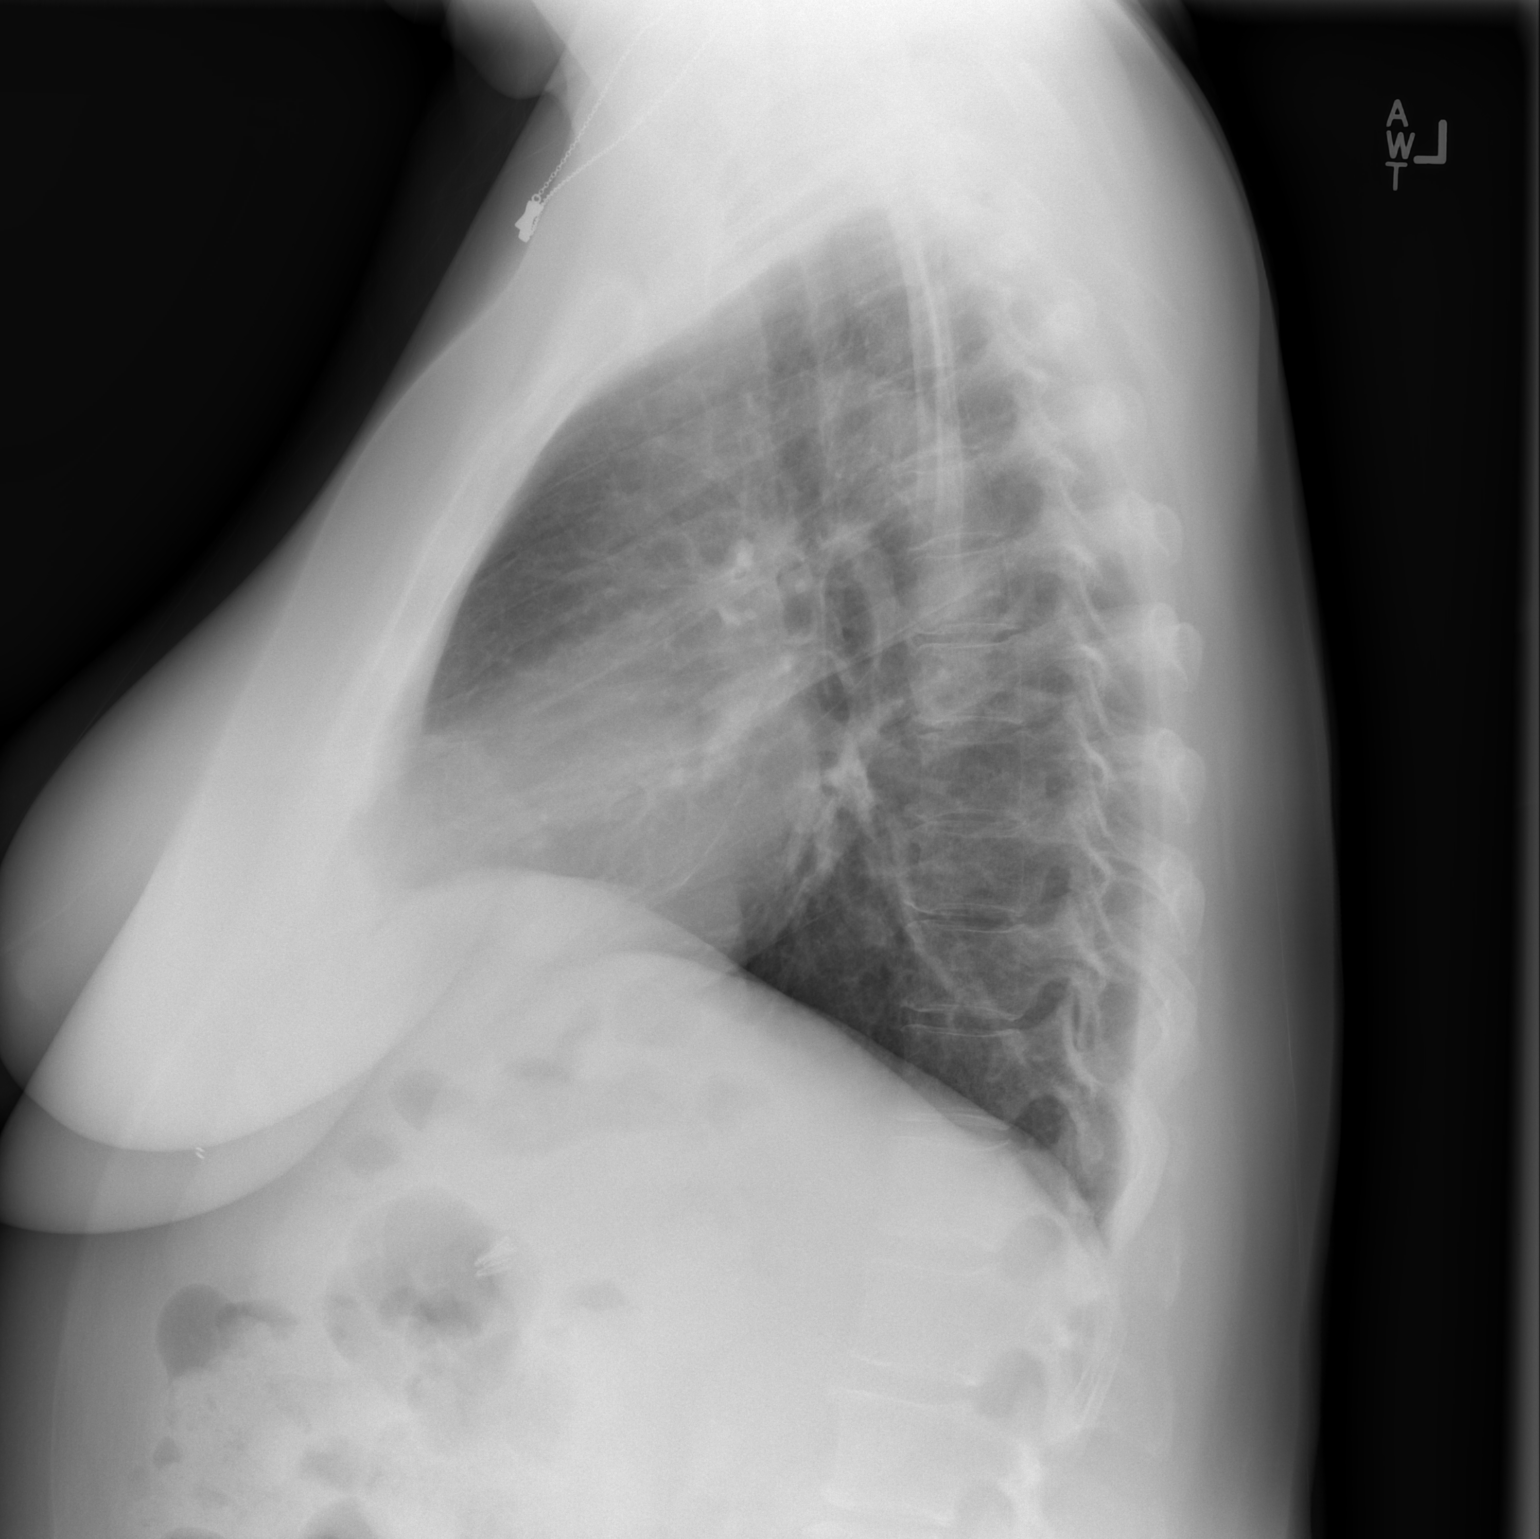

[2 of 2 positions shown; findings below may reference images not displayed]

FINDINGS: Normal cardiac silhouette and mediastinal contours given slightly
reduced lung volumes. Veiling opacities overlying the bilateral
lower lungs are favored to represent overlying breast tissues. No
focal airspace opacities. No pleural effusion or pneumothorax. No
evidence of edema. No acute osseus abnormalities. Post
cholecystectomy.
IMPRESSION: No acute cardiopulmonary disease.

## 2013-06-06 NOTE — Progress Notes (Signed)
lov dr Elsworth Soho 3/14.  Faxed CMP to Dr Wynelle Link via epic

## 2013-06-09 ENCOUNTER — Other Ambulatory Visit: Payer: Self-pay | Admitting: Orthopedic Surgery

## 2013-06-09 NOTE — H&P (Signed)
Kelsey Church 05/16/2013 3:08 PM Location: [place] Patient #: [id number] DOB: Jun 15, 1950 Widowed / Language: [name] / Race: White Female    History of Present Illness(Alexzandrew Monika Salk, III PA-C; 05/16/2013 3:14 PM) The patient is a 63 year old female who comes in today for a preoperative History and Physical. The patient is scheduled for a right total knee arthroplasty to be performed by Dr. [name]. Aluisio, MD at [facility] on 06-10-2013. The patient is a 63 year old female who presents with knee complaints. The patient is seen today for a second opinion. The patient reports left knee and right knee (worse) symptoms including: pain, swelling, locking and giving way which began year(s) ago without any known injury. She has had treatment for the right knee the 90's. She was living in [place] at the time and has been living here for about 2 years. She has had cortisone injections (last one in December) that help some. She does not recall a specific time when the left knee started bothering her but it has definitely been bothering her more in the past year. She has had only one cortisone injection in that left knee which was last month. She is at a point where she wants to get the knees replaced. She is having pain at all times, even at rest. Both knees hurt equally now. It used to be that the left knee hurt worse than the right. She has valgus deformities of both knees. They do feel like they want to give out at times. She has had multiple cortisone injections in the past with slight benefit, which is not long lasting. She has not had viscosupplement injections, as she was told that they probably not be successful given the extent of her arthritis. She is not having any associated hip pain. She occasionally gets some low back pain. She is ready to proceed with the knee repalcement. They have been treated conservatively in the past for the above stated problem and despite  conservative measures, they continue to have progressive pain and severe functional limitations and dysfunction. They have failed non-operative management including home exercise, medications, and injections. It is felt that they would benefit from undergoing total joint replacement. Risks and benefits of the procedure have been discussed with the patient and they elect to proceed with surgery. There are no active contraindications to surgery such as ongoing infection or rapidly progressive neurological disease.   Allergies No Known Drug Allergies.  Problem List/Past Medical Primary osteoarthritis of both knees (715.16) Sleep Apnea. uses CPAP Osteoarthritis Skin Cancer. Melanoma High blood pressure   Family History Cancer. grandmother mothers side Heart Disease. father Heart disease in female family member before age 60 Hypertension. father Osteoarthritis. mother Osteoporosis. mother    Social History Drug/Alcohol Rehab (Previously). no Drug/Alcohol Rehab (Currently). no Illicit drug use. no Exercise. Exercises daily; does other Alcohol use. never consumed alcohol Current work status. retired Careers information officer. 1 Tobacco use. never smoker Tobacco / smoke exposure. no Marital status. widowed Living situation. live alone Pain Contract. no Number of flights of stairs before winded. 2-3 Post-Surgical Plans. Home with sister's help.    Medication History Tylenol 8 Hour (650MG  Tablet ER, Oral) Active. Citalopram Hydrobromide (20MG  Tablet, Oral) Active. Prempro ( Oral) Specific dose unknown - Active. Meloxicam (15MG  Tablet, Oral) Active. Losartan Potassium-HCTZ (100-25MG  Tablet, Oral) Active. TraMADol HCl (50MG  Tablet, Oral) Active.   Past Surgical History Gallbladder Surgery. laporoscopic Foot Surgery. right Appendectomy Arthroscopy of Knee. right Colon Polyp Removal -  Colonoscopy Oophorectomy. right   Review of Systems General:Not  Present- Chills, Fever, Night Sweats, Fatigue, Weight Gain, Weight Loss and Memory Loss. Skin:Not Present- Hives, Itching, Rash, Eczema and Lesions. HEENT:Not Present- Tinnitus, Headache, Double Vision, Visual Loss, Hearing Loss and Dentures. Respiratory:Not Present- Shortness of breath with exertion, Shortness of breath at rest, Allergies, Coughing up blood and Chronic Cough. Cardiovascular:Not Present- Chest Pain, Racing/skipping heartbeats, Difficulty Breathing Lying Down, Murmur, Swelling and Palpitations. Gastrointestinal:Not Present- Bloody Stool, Heartburn, Abdominal Pain, Vomiting, Nausea, Constipation, Diarrhea, Difficulty Swallowing, Jaundice and Loss of appetitie. Female Genitourinary:Not Present- Blood in Urine, Urinary frequency, Weak urinary stream, Discharge, Flank Pain, Incontinence, Painful Urination, Urgency, Urinary Retention and Urinating at Night. Musculoskeletal:Not Present- Muscle Weakness, Muscle Pain, Joint Swelling, Joint Pain, Back Pain, Morning Stiffness and Spasms. Neurological:Not Present- Tremor, Dizziness, Blackout spells, Paralysis, Difficulty with balance and Weakness. Psychiatric:Not Present- Insomnia.    Vitals Pulse: 72 (Regular) Resp.: 16 (Unlabored) BP: 132/64 (Sitting, Left Arm, Standard)     Physical Exam The physical exam findings are as follows:   General Mental Status - Alert, cooperative and good historian. General Appearance- pleasant. Not in acute distress. Orientation- Oriented X3. Build & Nutrition- Well nourished and Well developed.   Head and Neck Head- normocephalic, atraumatic . Neck Global Assessment- supple. no bruit auscultated on the right and no bruit auscultated on the left.   Eye Vision- Wears corrective lenses. Pupil- Bilateral- Regular and Round. Motion- Bilateral- EOMI.   ENMT  upper partial and full lower denture plates  Chest and Lung Exam Auscultation: Breath sounds:-  clear at anterior chest wall and - clear at posterior chest wall. Adventitious sounds:- No Adventitious sounds.   Cardiovascular Auscultation:Rhythm- Regular rate and rhythm. Heart Sounds- S1 WNL and S2 WNL. Murmurs & Other Heart Sounds:Auscultation of the heart reveals - No Murmurs.   Abdomen Palpation/Percussion:Tenderness- Abdomen is non-tender to palpation. Rigidity (guarding)- Abdomen is soft. Auscultation:Auscultation of the abdomen reveals - Bowel sounds normal.   Female Genitourinary   Note: Not done, not pertinent to present illness  Musculoskeletal   Note: Well developed female alert and oriented in no apparent distress. Her hips show normal range of motion with no discomfort.  Both knees show no effusion. There is no valgus deformity to both knees. She does have some tenderness on the lateral joints lines. Range about 5-125 degrees on each side. Deformity is slightly worse on the left than the right. There is no instability noted about either knee. Pulses, sensation, and motor intact. The valgus deformity is more pronounced when she is standing.  RADIOGRAPHS Radiographs brought with her today. AP of both knees and lateral show severe bone on bone arthritis lateral compartment of both knees. Valgus deformity slightly worse on the left than the right. She has patellofemoral arthritis also.   Assessment & Plan Primary osteoarthritis of both knees (715.16  M17.0)  Note: Plan is for a Right Total Knee Replacement by Dr. Wynelle Link.  Plan is to go home with sister.  PCP - Dr. [name] - Patient has been seen preoperatively and felt to be stable for surgery. [facility]  The patient will not receive TXA (tranexamic acid) due to: Melanoma Cancer  Signed electronically by Joelene Millin, III PA-C

## 2013-06-10 ENCOUNTER — Encounter (HOSPITAL_COMMUNITY)
Admission: RE | Disposition: A | Discharge status: Home Health | Payer: Self-pay | Source: Ambulatory Visit | Attending: Orthopedic Surgery

## 2013-06-10 ENCOUNTER — Encounter (HOSPITAL_COMMUNITY): Payer: BC Managed Care – PPO | Admitting: Anesthesiology

## 2013-06-10 ENCOUNTER — Inpatient Hospital Stay (HOSPITAL_COMMUNITY): Payer: BC Managed Care – PPO | Admitting: Anesthesiology

## 2013-06-10 ENCOUNTER — Encounter (HOSPITAL_COMMUNITY): Payer: Self-pay | Admitting: *Deleted

## 2013-06-10 ENCOUNTER — Inpatient Hospital Stay (HOSPITAL_COMMUNITY)
Admission: RE | Admit: 2013-06-10 | Discharge: 2013-06-12 | DRG: 470 | Disposition: A | Discharge status: Home Health | Payer: BC Managed Care – PPO | Source: Ambulatory Visit | Attending: Orthopedic Surgery | Admitting: Orthopedic Surgery

## 2013-06-10 DIAGNOSIS — E876 Hypokalemia: Secondary | ICD-10-CM | POA: Diagnosis not present

## 2013-06-10 DIAGNOSIS — F3289 Other specified depressive episodes: Secondary | ICD-10-CM | POA: Diagnosis present

## 2013-06-10 DIAGNOSIS — Z8262 Family history of osteoporosis: Secondary | ICD-10-CM

## 2013-06-10 DIAGNOSIS — I10 Essential (primary) hypertension: Secondary | ICD-10-CM | POA: Diagnosis present

## 2013-06-10 DIAGNOSIS — M179 Osteoarthritis of knee, unspecified: Secondary | ICD-10-CM | POA: Diagnosis present

## 2013-06-10 DIAGNOSIS — M1710 Unilateral primary osteoarthritis, unspecified knee: Principal | ICD-10-CM | POA: Diagnosis present

## 2013-06-10 DIAGNOSIS — D62 Acute posthemorrhagic anemia: Secondary | ICD-10-CM

## 2013-06-10 DIAGNOSIS — F329 Major depressive disorder, single episode, unspecified: Secondary | ICD-10-CM | POA: Diagnosis present

## 2013-06-10 DIAGNOSIS — Z8249 Family history of ischemic heart disease and other diseases of the circulatory system: Secondary | ICD-10-CM

## 2013-06-10 DIAGNOSIS — Z85820 Personal history of malignant melanoma of skin: Secondary | ICD-10-CM

## 2013-06-10 DIAGNOSIS — Z96651 Presence of right artificial knee joint: Secondary | ICD-10-CM

## 2013-06-10 DIAGNOSIS — G4733 Obstructive sleep apnea (adult) (pediatric): Secondary | ICD-10-CM | POA: Diagnosis present

## 2013-06-10 DIAGNOSIS — E871 Hypo-osmolality and hyponatremia: Secondary | ICD-10-CM | POA: Diagnosis present

## 2013-06-10 DIAGNOSIS — Z79899 Other long term (current) drug therapy: Secondary | ICD-10-CM

## 2013-06-10 DIAGNOSIS — E7800 Pure hypercholesterolemia, unspecified: Secondary | ICD-10-CM | POA: Diagnosis present

## 2013-06-10 HISTORY — PX: TOTAL KNEE ARTHROPLASTY: SHX125

## 2013-06-10 LAB — TYPE AND SCREEN
ABO/RH(D): B POS
Antibody Screen: NEGATIVE

## 2013-06-10 SURGERY — ARTHROPLASTY, KNEE, TOTAL
Anesthesia: General | Site: Knee | Laterality: Right

## 2013-06-10 MED ORDER — CEFAZOLIN SODIUM-DEXTROSE 2-3 GM-% IV SOLR
INTRAVENOUS | Status: AC
  Filled 2013-06-10: qty 50

## 2013-06-10 MED ORDER — POLYETHYLENE GLYCOL 3350 17 G PO PACK: 17.0000 g | PACK | Freq: Every day | ORAL | Status: DC | PRN

## 2013-06-10 MED ORDER — PROPOFOL 10 MG/ML IV BOLUS
INTRAVENOUS | Status: DC | PRN
  Administered 2013-06-10: 160 mg via INTRAVENOUS

## 2013-06-10 MED ORDER — FLEET ENEMA 7-19 GM/118ML RE ENEM: 1.0000 | ENEMA | Freq: Once | RECTAL | Status: AC | PRN

## 2013-06-10 MED ORDER — CEFAZOLIN SODIUM-DEXTROSE 2-3 GM-% IV SOLR
2.0000 g | INTRAVENOUS | Status: AC
  Administered 2013-06-10: 2 g via INTRAVENOUS

## 2013-06-10 MED ORDER — GLYCOPYRROLATE 0.2 MG/ML IJ SOLN
INTRAMUSCULAR | Status: DC | PRN
  Administered 2013-06-10: .6 mg via INTRAVENOUS

## 2013-06-10 MED ORDER — LIDOCAINE HCL (CARDIAC) 20 MG/ML IV SOLN
INTRAVENOUS | Status: DC | PRN
  Administered 2013-06-10: 50 mg via INTRAVENOUS

## 2013-06-10 MED ORDER — LOSARTAN POTASSIUM-HCTZ 100-25 MG PO TABS: 1.0000 | ORAL_TABLET | Freq: Every morning | ORAL | Status: DC

## 2013-06-10 MED ORDER — 0.9 % SODIUM CHLORIDE (POUR BTL) OPTIME
TOPICAL | Status: DC | PRN
  Administered 2013-06-10: 1000 mL

## 2013-06-10 MED ORDER — HYDROMORPHONE HCL PF 1 MG/ML IJ SOLN
INTRAMUSCULAR | Status: AC
  Filled 2013-06-10: qty 1

## 2013-06-10 MED ORDER — ACETAMINOPHEN 10 MG/ML IV SOLN
INTRAVENOUS | Status: DC | PRN
  Administered 2013-06-10: 1000 mg via INTRAVENOUS

## 2013-06-10 MED ORDER — GLYCOPYRROLATE 0.2 MG/ML IJ SOLN
INTRAMUSCULAR | Status: AC
  Filled 2013-06-10: qty 3

## 2013-06-10 MED ORDER — LIDOCAINE HCL (CARDIAC) 20 MG/ML IV SOLN
INTRAVENOUS | Status: AC
  Filled 2013-06-10: qty 5

## 2013-06-10 MED ORDER — TRAMADOL HCL 50 MG PO TABS: 50.0000 mg | ORAL_TABLET | Freq: Four times a day (QID) | ORAL | Status: DC | PRN

## 2013-06-10 MED ORDER — LACTATED RINGERS IV SOLN
INTRAVENOUS | Status: DC
  Administered 2013-06-10: 11:00:00 via INTRAVENOUS

## 2013-06-10 MED ORDER — SODIUM CHLORIDE 0.9 % IJ SOLN
INTRAMUSCULAR | Status: AC
  Filled 2013-06-10: qty 50

## 2013-06-10 MED ORDER — HYDROMORPHONE HCL PF 2 MG/ML IJ SOLN
INTRAMUSCULAR | Status: AC
  Filled 2013-06-10: qty 1

## 2013-06-10 MED ORDER — ONDANSETRON HCL 4 MG/2ML IJ SOLN
INTRAMUSCULAR | Status: AC
  Filled 2013-06-10: qty 2

## 2013-06-10 MED ORDER — METOCLOPRAMIDE HCL 10 MG PO TABS: 5.0000 mg | ORAL_TABLET | Freq: Three times a day (TID) | ORAL | Status: DC | PRN

## 2013-06-10 MED ORDER — BUPIVACAINE LIPOSOME 1.3 % IJ SUSP
INTRAMUSCULAR | Status: DC | PRN
  Administered 2013-06-10: 20 mL

## 2013-06-10 MED ORDER — MIDAZOLAM HCL 5 MG/5ML IJ SOLN
INTRAMUSCULAR | Status: DC | PRN
  Administered 2013-06-10 (×2): 1 mg via INTRAVENOUS

## 2013-06-10 MED ORDER — DEXAMETHASONE SODIUM PHOSPHATE 10 MG/ML IJ SOLN
10.0000 mg | Freq: Every day | INTRAMUSCULAR | Status: AC
  Filled 2013-06-10: qty 1

## 2013-06-10 MED ORDER — HYDROCHLOROTHIAZIDE 25 MG PO TABS
25.0000 mg | ORAL_TABLET | Freq: Every day | ORAL | Status: DC
  Administered 2013-06-10 – 2013-06-12 (×3): 25 mg via ORAL
  Filled 2013-06-10 (×3): qty 1

## 2013-06-10 MED ORDER — PHENOL 1.4 % MT LIQD: 1.0000 | OROMUCOSAL | Status: DC | PRN

## 2013-06-10 MED ORDER — PROPOFOL 10 MG/ML IV BOLUS
INTRAVENOUS | Status: AC
  Filled 2013-06-10: qty 20

## 2013-06-10 MED ORDER — ACETAMINOPHEN 650 MG RE SUPP: 650.0000 mg | Freq: Four times a day (QID) | RECTAL | Status: DC | PRN

## 2013-06-10 MED ORDER — HYDROMORPHONE HCL PF 1 MG/ML IJ SOLN
INTRAMUSCULAR | Status: DC | PRN
  Administered 2013-06-10 (×2): 0.5 mg via INTRAVENOUS
  Administered 2013-06-10: 1 mg via INTRAVENOUS

## 2013-06-10 MED ORDER — METOCLOPRAMIDE HCL 5 MG/ML IJ SOLN: 5.0000 mg | Freq: Three times a day (TID) | INTRAMUSCULAR | Status: DC | PRN

## 2013-06-10 MED ORDER — CEFAZOLIN SODIUM-DEXTROSE 2-3 GM-% IV SOLR
2.0000 g | Freq: Four times a day (QID) | INTRAVENOUS | Status: AC
  Administered 2013-06-10 (×2): 2 g via INTRAVENOUS
  Filled 2013-06-10 (×2): qty 50

## 2013-06-10 MED ORDER — PROMETHAZINE HCL 25 MG/ML IJ SOLN: 6.2500 mg | INTRAMUSCULAR | Status: DC | PRN

## 2013-06-10 MED ORDER — SODIUM CHLORIDE 0.9 % IR SOLN
Status: DC | PRN
  Administered 2013-06-10: 1000 mL

## 2013-06-10 MED ORDER — METOCLOPRAMIDE HCL 5 MG/ML IJ SOLN
INTRAMUSCULAR | Status: AC
  Filled 2013-06-10: qty 2

## 2013-06-10 MED ORDER — HYDROMORPHONE HCL PF 1 MG/ML IJ SOLN
0.2500 mg | INTRAMUSCULAR | Status: DC | PRN
  Administered 2013-06-10 (×4): 0.5 mg via INTRAVENOUS

## 2013-06-10 MED ORDER — ONDANSETRON HCL 4 MG PO TABS: 4.0000 mg | ORAL_TABLET | Freq: Four times a day (QID) | ORAL | Status: DC | PRN

## 2013-06-10 MED ORDER — ROCURONIUM BROMIDE 100 MG/10ML IV SOLN
INTRAVENOUS | Status: DC | PRN
  Administered 2013-06-10: 50 mg via INTRAVENOUS

## 2013-06-10 MED ORDER — LACTATED RINGERS IV SOLN
INTRAVENOUS | Status: DC | PRN
  Administered 2013-06-10 (×3): via INTRAVENOUS

## 2013-06-10 MED ORDER — NEOSTIGMINE METHYLSULFATE 1 MG/ML IJ SOLN
INTRAMUSCULAR | Status: AC
  Filled 2013-06-10: qty 10

## 2013-06-10 MED ORDER — FENTANYL CITRATE 0.05 MG/ML IJ SOLN
INTRAMUSCULAR | Status: DC | PRN
  Administered 2013-06-10: 100 ug via INTRAVENOUS
  Administered 2013-06-10 (×2): 50 ug via INTRAVENOUS
  Administered 2013-06-10: 100 ug via INTRAVENOUS

## 2013-06-10 MED ORDER — FENTANYL CITRATE 0.05 MG/ML IJ SOLN
INTRAMUSCULAR | Status: AC
  Filled 2013-06-10: qty 5

## 2013-06-10 MED ORDER — METHOCARBAMOL 500 MG PO TABS
500.0000 mg | ORAL_TABLET | Freq: Four times a day (QID) | ORAL | Status: DC | PRN
  Administered 2013-06-11 – 2013-06-12 (×3): 500 mg via ORAL
  Filled 2013-06-10 (×3): qty 1

## 2013-06-10 MED ORDER — ACETAMINOPHEN 500 MG PO TABS
1000.0000 mg | ORAL_TABLET | Freq: Four times a day (QID) | ORAL | Status: AC
  Administered 2013-06-11 – 2013-06-12 (×4): 1000 mg via ORAL
  Filled 2013-06-10 (×6): qty 2

## 2013-06-10 MED ORDER — RIVAROXABAN 10 MG PO TABS
10.0000 mg | ORAL_TABLET | Freq: Every day | ORAL | Status: DC
  Administered 2013-06-11 – 2013-06-12 (×2): 10 mg via ORAL
  Filled 2013-06-10 (×3): qty 1

## 2013-06-10 MED ORDER — SODIUM CHLORIDE 0.9 % IJ SOLN
INTRAMUSCULAR | Status: DC | PRN
  Administered 2013-06-10: 30 mL

## 2013-06-10 MED ORDER — FENTANYL CITRATE 0.05 MG/ML IJ SOLN
INTRAMUSCULAR | Status: AC
  Filled 2013-06-10: qty 2

## 2013-06-10 MED ORDER — STERILE WATER FOR IRRIGATION IR SOLN
Status: DC | PRN
  Administered 2013-06-10: 1500 mL

## 2013-06-10 MED ORDER — NEOSTIGMINE METHYLSULFATE 1 MG/ML IJ SOLN
INTRAMUSCULAR | Status: DC | PRN
  Administered 2013-06-10: 4 mg via INTRAVENOUS

## 2013-06-10 MED ORDER — DEXAMETHASONE SODIUM PHOSPHATE 10 MG/ML IJ SOLN
INTRAMUSCULAR | Status: AC
  Filled 2013-06-10: qty 1

## 2013-06-10 MED ORDER — DIPHENHYDRAMINE HCL 12.5 MG/5ML PO ELIX: 12.5000 mg | ORAL_SOLUTION | ORAL | Status: DC | PRN

## 2013-06-10 MED ORDER — DEXAMETHASONE 4 MG PO TABS
10.0000 mg | ORAL_TABLET | Freq: Every day | ORAL | Status: AC
  Administered 2013-06-11: 10 mg via ORAL
  Filled 2013-06-10: qty 1

## 2013-06-10 MED ORDER — ACETAMINOPHEN 10 MG/ML IV SOLN
1000.0000 mg | Freq: Once | INTRAVENOUS | Status: DC
  Filled 2013-06-10: qty 100

## 2013-06-10 MED ORDER — MIDAZOLAM HCL 2 MG/2ML IJ SOLN
INTRAMUSCULAR | Status: AC
  Filled 2013-06-10: qty 2

## 2013-06-10 MED ORDER — ONDANSETRON HCL 4 MG/2ML IJ SOLN
INTRAMUSCULAR | Status: DC | PRN
  Administered 2013-06-10: 4 mg via INTRAVENOUS

## 2013-06-10 MED ORDER — BUPIVACAINE HCL 0.25 % IJ SOLN
INTRAMUSCULAR | Status: DC | PRN
  Administered 2013-06-10: 20 mL

## 2013-06-10 MED ORDER — METOCLOPRAMIDE HCL 5 MG/ML IJ SOLN
INTRAMUSCULAR | Status: DC | PRN
  Administered 2013-06-10: 10 mg via INTRAVENOUS

## 2013-06-10 MED ORDER — CITALOPRAM HYDROBROMIDE 20 MG PO TABS
20.0000 mg | ORAL_TABLET | Freq: Every day | ORAL | Status: DC
  Administered 2013-06-10 – 2013-06-11 (×2): 20 mg via ORAL
  Filled 2013-06-10 (×3): qty 1

## 2013-06-10 MED ORDER — ONDANSETRON HCL 4 MG/2ML IJ SOLN: 4.0000 mg | Freq: Four times a day (QID) | INTRAMUSCULAR | Status: DC | PRN

## 2013-06-10 MED ORDER — BISACODYL 10 MG RE SUPP: 10.0000 mg | Freq: Every day | RECTAL | Status: DC | PRN

## 2013-06-10 MED ORDER — LACTATED RINGERS IV SOLN
INTRAVENOUS | Status: DC
  Administered 2013-06-10: 1000 mL via INTRAVENOUS

## 2013-06-10 MED ORDER — OXYCODONE HCL 5 MG PO TABS
5.0000 mg | ORAL_TABLET | ORAL | Status: DC | PRN
  Administered 2013-06-10 – 2013-06-11 (×3): 10 mg via ORAL
  Administered 2013-06-11: 5 mg via ORAL
  Administered 2013-06-11 – 2013-06-12 (×5): 10 mg via ORAL
  Filled 2013-06-10 (×8): qty 2
  Filled 2013-06-10: qty 1

## 2013-06-10 MED ORDER — METHOCARBAMOL 100 MG/ML IJ SOLN
500.0000 mg | Freq: Four times a day (QID) | INTRAVENOUS | Status: DC | PRN
  Administered 2013-06-10: 500 mg via INTRAVENOUS
  Filled 2013-06-10: qty 5

## 2013-06-10 MED ORDER — MORPHINE SULFATE 2 MG/ML IJ SOLN: 1.0000 mg | INTRAMUSCULAR | Status: DC | PRN

## 2013-06-10 MED ORDER — ROCURONIUM BROMIDE 100 MG/10ML IV SOLN
INTRAVENOUS | Status: AC
  Filled 2013-06-10: qty 1

## 2013-06-10 MED ORDER — MENTHOL 3 MG MT LOZG: 1.0000 | LOZENGE | OROMUCOSAL | Status: DC | PRN

## 2013-06-10 MED ORDER — DEXAMETHASONE SODIUM PHOSPHATE 10 MG/ML IJ SOLN
INTRAMUSCULAR | Status: DC | PRN
  Administered 2013-06-10: 10 mg via INTRAVENOUS

## 2013-06-10 MED ORDER — KETOROLAC TROMETHAMINE 15 MG/ML IJ SOLN: 7.5000 mg | Freq: Four times a day (QID) | INTRAMUSCULAR | Status: AC | PRN

## 2013-06-10 MED ORDER — ACETAMINOPHEN 325 MG PO TABS: 650.0000 mg | ORAL_TABLET | Freq: Four times a day (QID) | ORAL | Status: DC | PRN

## 2013-06-10 MED ORDER — BUPIVACAINE LIPOSOME 1.3 % IJ SUSP
20.0000 mL | Freq: Once | INTRAMUSCULAR | Status: DC
  Filled 2013-06-10: qty 20

## 2013-06-10 MED ORDER — DEXTROSE-NACL 5-0.45 % IV SOLN
INTRAVENOUS | Status: DC
  Administered 2013-06-10: 19:00:00 via INTRAVENOUS

## 2013-06-10 MED ORDER — BUPIVACAINE HCL (PF) 0.25 % IJ SOLN
INTRAMUSCULAR | Status: AC
  Filled 2013-06-10: qty 30

## 2013-06-10 MED ORDER — LOSARTAN POTASSIUM 50 MG PO TABS
100.0000 mg | ORAL_TABLET | Freq: Every day | ORAL | Status: DC
  Administered 2013-06-10 – 2013-06-12 (×2): 100 mg via ORAL
  Filled 2013-06-10 (×3): qty 2

## 2013-06-10 MED ORDER — DOCUSATE SODIUM 100 MG PO CAPS
100.0000 mg | ORAL_CAPSULE | Freq: Two times a day (BID) | ORAL | Status: DC
  Administered 2013-06-10 – 2013-06-12 (×5): 100 mg via ORAL

## 2013-06-10 SURGICAL SUPPLY — 55 items
BAG ZIPLOCK 12X15 (MISCELLANEOUS) ×2 IMPLANT
BANDAGE ELASTIC 6 VELCRO ST LF (GAUZE/BANDAGES/DRESSINGS) ×2 IMPLANT
BANDAGE ESMARK 6X9 LF (GAUZE/BANDAGES/DRESSINGS) ×1 IMPLANT
BLADE SAG 18X100X1.27 (BLADE) ×2 IMPLANT
BLADE SAW SGTL 11.0X1.19X90.0M (BLADE) ×2 IMPLANT
BNDG ESMARK 6X9 LF (GAUZE/BANDAGES/DRESSINGS) ×2
BOWL SMART MIX CTS (DISPOSABLE) ×2 IMPLANT
CAP KNEE ATTUNE RP ×2 IMPLANT
CEMENT HV SMART SET (Cement) ×4 IMPLANT
CUFF TOURN SGL QUICK 34 (TOURNIQUET CUFF) ×1
CUFF TRNQT CYL 34X4X40X1 (TOURNIQUET CUFF) ×1 IMPLANT
DECANTER SPIKE VIAL GLASS SM (MISCELLANEOUS) ×2 IMPLANT
DRAPE EXTREMITY T 121X128X90 (DRAPE) ×2 IMPLANT
DRAPE POUCH INSTRU U-SHP 10X18 (DRAPES) ×2 IMPLANT
DRAPE U-SHAPE 47X51 STRL (DRAPES) ×2 IMPLANT
DRSG ADAPTIC 3X8 NADH LF (GAUZE/BANDAGES/DRESSINGS) ×2 IMPLANT
DRSG PAD ABDOMINAL 8X10 ST (GAUZE/BANDAGES/DRESSINGS) ×2 IMPLANT
DURAPREP 26ML APPLICATOR (WOUND CARE) ×2 IMPLANT
ELECT REM PT RETURN 9FT ADLT (ELECTROSURGICAL) ×2
ELECTRODE REM PT RTRN 9FT ADLT (ELECTROSURGICAL) ×1 IMPLANT
EVACUATOR 1/8 PVC DRAIN (DRAIN) ×2 IMPLANT
FACESHIELD WRAPAROUND (MASK) ×10 IMPLANT
GLOVE BIO SURGEON STRL SZ7.5 (GLOVE) IMPLANT
GLOVE BIO SURGEON STRL SZ8 (GLOVE) ×2 IMPLANT
GLOVE BIOGEL PI IND STRL 8 (GLOVE) ×2 IMPLANT
GLOVE BIOGEL PI INDICATOR 8 (GLOVE) ×2
GLOVE SURG SS PI 6.5 STRL IVOR (GLOVE) IMPLANT
GOWN STRL REUS W/TWL LRG LVL3 (GOWN DISPOSABLE) ×2 IMPLANT
GOWN STRL REUS W/TWL XL LVL3 (GOWN DISPOSABLE) IMPLANT
HANDPIECE INTERPULSE COAX TIP (DISPOSABLE) ×1
IMMOBILIZER KNEE 20 (SOFTGOODS) ×4 IMPLANT
IMMOBILIZER KNEE 20 THIGH 36 (SOFTGOODS) ×1 IMPLANT
KIT BASIN OR (CUSTOM PROCEDURE TRAY) ×2 IMPLANT
MANIFOLD NEPTUNE II (INSTRUMENTS) ×2 IMPLANT
NDL SAFETY ECLIPSE 18X1.5 (NEEDLE) ×2 IMPLANT
NEEDLE HYPO 18GX1.5 SHARP (NEEDLE) ×2
NS IRRIG 1000ML POUR BTL (IV SOLUTION) ×2 IMPLANT
PACK TOTAL JOINT (CUSTOM PROCEDURE TRAY) ×2 IMPLANT
PADDING CAST COTTON 6X4 STRL (CAST SUPPLIES) ×4 IMPLANT
POSITIONER SURGICAL ARM (MISCELLANEOUS) ×2 IMPLANT
SET HNDPC FAN SPRY TIP SCT (DISPOSABLE) ×1 IMPLANT
SPONGE GAUZE 4X4 12PLY (GAUZE/BANDAGES/DRESSINGS) ×2 IMPLANT
STRIP CLOSURE SKIN 1/2X4 (GAUZE/BANDAGES/DRESSINGS) ×4 IMPLANT
SUCTION FRAZIER 12FR DISP (SUCTIONS) ×2 IMPLANT
SUT MNCRL AB 4-0 PS2 18 (SUTURE) ×2 IMPLANT
SUT VIC AB 2-0 CT1 27 (SUTURE) ×3
SUT VIC AB 2-0 CT1 TAPERPNT 27 (SUTURE) ×3 IMPLANT
SUT VLOC 180 0 24IN GS25 (SUTURE) ×2 IMPLANT
SYR 20CC LL (SYRINGE) ×2 IMPLANT
SYR 50ML LL SCALE MARK (SYRINGE) ×2 IMPLANT
TOWEL OR 17X26 10 PK STRL BLUE (TOWEL DISPOSABLE) ×2 IMPLANT
TOWEL OR NON WOVEN STRL DISP B (DISPOSABLE) IMPLANT
TRAY FOLEY CATH 14FRSI W/METER (CATHETERS) ×2 IMPLANT
WATER STERILE IRR 1500ML POUR (IV SOLUTION) ×2 IMPLANT
WRAP KNEE MAXI GEL POST OP (GAUZE/BANDAGES/DRESSINGS) ×2 IMPLANT

## 2013-06-10 NOTE — Evaluation (Signed)
Physical Therapy Evaluation Patient Details Name: Kelsey Church MRN: [id number] DOB: 1950/08/25 Today's Date: 06/10/2013   History of Present Illness  Pt is a 63 year old female s/p R TKA; PMH sleep apnea, CPAP  Clinical Impression  Pt is s/p R TKA surgery resulting in functional limitations due to the deficits listed below (see PT Problem List).  Pt unable to perform a SLR, therefore ambulated with R knee immobilizer.  Ambulated in hallway with RW.  Pt expects to be discharged home, however moving shortly thereafter with sister to assist.  Pt will benefit from skilled PT to increase their independence and safety with mobility to allow discharge.    Follow Up Recommendations Home health PT    Equipment Recommendations  Rolling walker with 5" wheels;3in1 (PT)    Recommendations for Other Services       Precautions / Restrictions Precautions Precautions: Fall;Knee Required Braces or Orthoses: Knee Immobilizer - Right Knee Immobilizer - Right: Discontinue once straight leg raise with < 10 degree lag Restrictions Weight Bearing Restrictions: Yes LLE Weight Bearing: Weight bearing as tolerated      Mobility  Bed Mobility Overal bed mobility: Needs Assistance Bed Mobility: Supine to Sit     Supine to sit: Min guard;HOB elevated     General bed mobility comments: verbal cues for safe technique  Transfers Overall transfer level: Needs assistance Equipment used: Rolling walker (2 wheeled) Transfers: Sit to/from Stand Sit to Stand: Min guard         General transfer comment: verbal cues for safe technique, cues for UE and LE placement for transfer to minimize pain  Ambulation/Gait Ambulation/Gait assistance: Min guard Ambulation Distance (Feet): 80 Feet Assistive device: Rolling walker (2 wheeled) Gait Pattern/deviations: Step-to pattern;Decreased stride length;Antalgic Gait velocity: decreased   General Gait Details: verbal cues for sequence with RW; encouraged  heel to toe pattern, pt reported feeling muscle tightness in posterior knee  Stairs            Wheelchair Mobility    Modified Rankin (Stroke Patients Only)       Balance                                             Pertinent Vitals/Pain Pt premedicated.  Activity to tolerance.  O2 WNL on RA.    Home Living Family/patient expects to be discharged to:: Private residence Living Arrangements: Other relatives (sister) Available Help at Discharge: Family Type of Home: Apartment Home Access: Level entry     Home Layout: One level Home Equipment: None Additional Comments: pt currently live alone in level entry apartment; on Friday she expects to move into a level entry, one level town home with her sister; pt hired movers to pack and move her belongings; requests a 3 in 1, reports her toilet is too low for her to use    Prior Function Level of Independence: Independent               Hand Dominance        Extremity/Trunk Assessment   Upper Extremity Assessment: Overall WFL for tasks assessed           Lower Extremity Assessment: RLE deficits/detail RLE Deficits / Details: fair quad contraction, unable to perform SLR; ROM to be assessed tomorrow    Cervical / Trunk Assessment: Normal  Communication   Communication: No difficulties  Cognition Arousal/Alertness: Awake/alert Behavior During Therapy: WFL for tasks assessed/performed Overall Cognitive Status: Within Functional Limits for tasks assessed                      General Comments      Exercises        Assessment/Plan    PT Assessment Patient needs continued PT services  PT Diagnosis Difficulty walking   PT Problem List Decreased strength;Decreased range of motion;Decreased activity tolerance;Decreased balance;Decreased mobility;Decreased knowledge of use of DME  PT Treatment Interventions DME instruction;Gait training;Functional mobility training;Therapeutic  activities;Therapeutic exercise;Balance training;Patient/family education   PT Goals (Current goals can be found in the Care Plan section) Acute Rehab PT Goals Patient Stated Goal: return home PT Goal Formulation: With patient Time For Goal Achievement: 06/15/13 Potential to Achieve Goals: Good    Frequency 7X/week   Barriers to discharge        Co-evaluation               End of Session Equipment Utilized During Treatment: Gait belt;Right knee immobilizer Activity Tolerance: Patient tolerated treatment well Patient left: in bed;with call bell/phone within reach           Time: [id number] PT Time Calculation (min): 26 min   Charges:   PT Evaluation $Initial PT Evaluation Tier I: 1 Procedure PT Treatments $Gait Training: 8-22 mins   PT G CodesJacqulyn Cane Jun 29, 2013, 4:57 PM Jacqulyn Cane SPT Jun 29, 2013

## 2013-06-10 NOTE — Evaluation (Signed)
I have reviewed this note and agree with all findings. Kati Graceland Wachter, PT, DPT Pager: [phone]   

## 2013-06-10 NOTE — Transfer of Care (Signed)
Immediate Anesthesia Transfer of Care Note  Patient: Kelsey Church  Procedure(s) Performed: Procedure(s): RIGHT TOTAL KNEE ARTHROPLASTY (Right)  Patient Location: PACU  Anesthesia Type:General  Level of Consciousness: awake, alert , oriented and patient cooperative  Airway & Oxygen Therapy: Patient Spontanous Breathing and Patient connected to face mask oxygen  Post-op Assessment: Report given to PACU RN, Post -op Vital signs reviewed and stable and Patient moving all extremities  Post vital signs: Reviewed and stable  Complications: No apparent anesthesia complications

## 2013-06-10 NOTE — H&P (View-Only) (Signed)
Kelsey Church 05/16/2013 3:08 PM Location: [place] Patient #: [id number] DOB: Jul 12, 1950 Widowed / Language: Kelsey Church / Race: White Female    History of Present Illness(Kelsey Church Monika Salk, III PA-C; 05/16/2013 3:14 PM) The patient is a 63 year old female who comes in today for a preoperative History and Physical. The patient is scheduled for a right total knee arthroplasty to be performed by Dr. [name]. Aluisio, MD at [facility] on 06-10-2013. The patient is a 63 year old female who presents with knee complaints. The patient is seen today for a second opinion. The patient reports left knee and right knee (worse) symptoms including: pain, swelling, locking and giving way which began year(s) ago without any known injury. She has had treatment for the right knee the 90's. She was living in [place] at the time and has been living here for about 2 years. She has had cortisone injections (last one in December) that help some. She does not recall a specific time when the left knee started bothering her but it has definitely been bothering her more in the past year. She has had only one cortisone injection in that left knee which was last month. She is at a point where she wants to get the knees replaced. She is having pain at all times, even at rest. Both knees hurt equally now. It used to be that the left knee hurt worse than the right. She has valgus deformities of both knees. They do feel like they want to give out at times. She has had multiple cortisone injections in the past with slight benefit, which is not long lasting. She has not had viscosupplement injections, as she was told that they probably not be successful given the extent of her arthritis. She is not having any associated hip pain. She occasionally gets some low back pain. She is ready to proceed with the knee repalcement. They have been treated conservatively in the past for the above stated problem and despite  conservative measures, they continue to have progressive pain and severe functional limitations and dysfunction. They have failed non-operative management including home exercise, medications, and injections. It is felt that they would benefit from undergoing total joint replacement. Risks and benefits of the procedure have been discussed with the patient and they elect to proceed with surgery. There are no active contraindications to surgery such as ongoing infection or rapidly progressive neurological disease.   Allergies No Known Drug Allergies.  Problem List/Past Medical Primary osteoarthritis of both knees (715.16) Sleep Apnea. uses CPAP Osteoarthritis Skin Cancer. Melanoma High blood pressure   Family History Cancer. grandmother mothers side Heart Disease. father Heart disease in female family member before age 25 Hypertension. father Osteoarthritis. mother Osteoporosis. mother    Social History Drug/Alcohol Rehab (Previously). no Drug/Alcohol Rehab (Currently). no Illicit drug use. no Exercise. Exercises daily; does other Alcohol use. never consumed alcohol Current work status. retired Careers information officer. 1 Tobacco use. never smoker Tobacco / smoke exposure. no Marital status. widowed Living situation. live alone Pain Contract. no Number of flights of stairs before winded. 2-3 Post-Surgical Plans. Home with sister's help.    Medication History Tylenol 8 Hour (650MG  Tablet ER, Oral) Active. Citalopram Hydrobromide (20MG  Tablet, Oral) Active. Prempro ( Oral) Specific dose unknown - Active. Meloxicam (15MG  Tablet, Oral) Active. Losartan Potassium-HCTZ (100-25MG  Tablet, Oral) Active. TraMADol HCl (50MG  Tablet, Oral) Active.   Past Surgical History Gallbladder Surgery. laporoscopic Foot Surgery. right Appendectomy Arthroscopy of Knee. right Colon Polyp Removal -  Colonoscopy Oophorectomy. right   Review of Systems General:Not  Present- Chills, Fever, Night Sweats, Fatigue, Weight Gain, Weight Loss and Memory Loss. Skin:Not Present- Hives, Itching, Rash, Eczema and Lesions. HEENT:Not Present- Tinnitus, Headache, Double Vision, Visual Loss, Hearing Loss and Dentures. Respiratory:Not Present- Shortness of breath with exertion, Shortness of breath at rest, Allergies, Coughing up blood and Chronic Cough. Cardiovascular:Not Present- Chest Pain, Racing/skipping heartbeats, Difficulty Breathing Lying Down, Murmur, Swelling and Palpitations. Gastrointestinal:Not Present- Bloody Stool, Heartburn, Abdominal Pain, Vomiting, Nausea, Constipation, Diarrhea, Difficulty Swallowing, Jaundice and Loss of appetitie. Female Genitourinary:Not Present- Blood in Urine, Urinary frequency, Weak urinary stream, Discharge, Flank Pain, Incontinence, Painful Urination, Urgency, Urinary Retention and Urinating at Night. Musculoskeletal:Not Present- Muscle Weakness, Muscle Pain, Joint Swelling, Joint Pain, Back Pain, Morning Stiffness and Spasms. Neurological:Not Present- Tremor, Dizziness, Blackout spells, Paralysis, Difficulty with balance and Weakness. Psychiatric:Not Present- Insomnia.    Vitals Pulse: 72 (Regular) Resp.: 16 (Unlabored) BP: 132/64 (Sitting, Left Arm, Standard)     Physical Exam The physical exam findings are as follows:   General Mental Status - Alert, cooperative and good historian. General Appearance- pleasant. Not in acute distress. Orientation- Oriented X3. Build & Nutrition- Well nourished and Well developed.   Head and Neck Head- normocephalic, atraumatic . Neck Global Assessment- supple. no bruit auscultated on the right and no bruit auscultated on the left.   Eye Vision- Wears corrective lenses. Pupil- Bilateral- Regular and Round. Motion- Bilateral- EOMI.   ENMT  upper partial and full lower denture plates  Chest and Lung Exam Auscultation: Breath sounds:-  clear at anterior chest wall and - clear at posterior chest wall. Adventitious sounds:- No Adventitious sounds.   Cardiovascular Auscultation:Rhythm- Regular rate and rhythm. Heart Sounds- S1 WNL and S2 WNL. Murmurs & Other Heart Sounds:Auscultation of the heart reveals - No Murmurs.   Abdomen Palpation/Percussion:Tenderness- Abdomen is non-tender to palpation. Rigidity (guarding)- Abdomen is soft. Auscultation:Auscultation of the abdomen reveals - Bowel sounds normal.   Female Genitourinary   Note: Not done, not pertinent to present illness  Musculoskeletal   Note: Well developed female alert and oriented in no apparent distress. Her hips show normal range of motion with no discomfort.  Both knees show no effusion. There is no valgus deformity to both knees. She does have some tenderness on the lateral joints lines. Range about 5-125 degrees on each side. Deformity is slightly worse on the left than the right. There is no instability noted about either knee. Pulses, sensation, and motor intact. The valgus deformity is more pronounced when she is standing.  RADIOGRAPHS Radiographs brought with her today. AP of both knees and lateral show severe bone on bone arthritis lateral compartment of both knees. Valgus deformity slightly worse on the left than the right. She has patellofemoral arthritis also.   Assessment & Plan Primary osteoarthritis of both knees (715.16  M17.0)  Note: Plan is for a Right Total Knee Replacement by Dr. Wynelle Link.  Plan is to go home with sister.  PCP - Dr. [name] - Patient has been seen preoperatively and felt to be stable for surgery. [facility]  The patient will not receive TXA (tranexamic acid) due to: Melanoma Cancer  Signed electronically by Joelene Millin, III PA-C

## 2013-06-10 NOTE — Anesthesia Postprocedure Evaluation (Signed)
Anesthesia Post Note  Patient: Kelsey Church  Procedure(s) Performed: Procedure(s) (LRB): RIGHT TOTAL KNEE ARTHROPLASTY (Right)  Anesthesia type: General  Patient location: PACU  Post pain: Pain level controlled  Post assessment: Post-op Vital signs reviewed  Last Vitals:  Filed Vitals:   06/10/13 1530  BP: 117/76  Pulse: 85  Temp: 36.6 C  Resp: 14    Post vital signs: Reviewed  Level of consciousness: sedated  Complications: No apparent anesthesia complications

## 2013-06-10 NOTE — Op Note (Signed)
Pre-operative diagnosis- Osteoarthritis  Right knee(s)  Post-operative diagnosis- Osteoarthritis Right knee(s)  Procedure-  Right  Total Knee Arthroplasty (Attune system)  Surgeon- [name]. Kendricks Reap, MD  Assistant- Ardeen Jourdain, PA-C   Anesthesia-  General  EBL-* No blood loss amount entered *   Drains Hemovac  Tourniquet time-  Total Tourniquet Time Documented: Thigh (Right) - 35 minutes Total: Thigh (Right) - 35 minutes     Complications- None  Condition-PACU - hemodynamically stable.   Brief Clinical Note  Kelsey Church is a 63 y.o. year old female with end stage OA of her right knee with progressively worsening pain and dysfunction. She has constant pain, with activity and at rest and significant functional deficits with difficulties even with ADLs. She has had extensive non-op management including analgesics, injections of cortisone and viscosupplements, and home exercise program, but remains in significant pain with significant dysfunction.Radiographs show bone on bone arthritis lateral and patellofemoral with large valgus deformity. She presents now for right Total Knee Arthroplasty.    Procedure in detail---   The patient is brought into the operating room and positioned supine on the operating table. After successful administration of  General,   a tourniquet is placed high on the  Right thigh(s) and the lower extremity is prepped and draped in the usual sterile fashion. Time out is performed by the operating team and then the  Right lower extremity is wrapped in Esmarch, knee flexed and the tourniquet inflated to 300 mmHg.       A midline incision is made with a ten blade through the subcutaneous tissue to the level of the extensor mechanism. A fresh blade is used to make a medial parapatellar arthrotomy. Soft tissue over the proximal medial tibia is subperiosteally elevated to the joint line with a knife and into the semimembranosus bursa with a Cobb elevator. Soft  tissue over the proximal lateral tibia is elevated with attention being paid to avoiding the patellar tendon on the tibial tubercle. The patella is everted, knee flexed 90 degrees and the ACL and PCL are removed. Findings are bone on bone lateral and patellofemoral.        The drill is used to create a starting hole in the distal femur and the canal is thoroughly irrigated with sterile saline to remove the fatty contents. The 5 degree Right  valgus alignment guide is placed into the femoral canal and the distal femoral cutting block is pinned to remove 9 mm off the distal femur. Resection is made with an oscillating saw.      The tibia is subluxed forward and the menisci are removed. The extramedullary alignment guide is placed referencing proximally at the medial aspect of the tibial tubercle and distally along the second metatarsal axis and tibial crest. The block is pinned to remove 50mm off the more deficient lateral  side. Resection is made with an oscillating saw. Size 4is the most appropriate size for the tibia and the proximal tibia is prepared with the modular drill and keel punch for that size.      The femoral sizing guide is placed and size 4 is most appropriate. Rotation is marked off the epicondylar axis and confirmed by creating a rectangular flexion gap at 90 degrees. The size 4 cutting block is pinned in this rotation and the anterior, posterior and chamfer cuts are made with the oscillating saw. The intercondylar block is then placed and that cut is made.      Trial size 4 tibial  component, trial size 4 posterior stabilized femur and a 6  mm posterior stabilized rotating platform insert trial is placed. Full extension is achieved with excellent varus/valgus and anterior/posterior balance throughout full range of motion. The patella is everted and thickness measured to be 22  mm. Free hand resection is taken to 12 mm, a 35 template is placed, lug holes are drilled, trial patella is placed, and it  tracks normally. Osteophytes are removed off the posterior femur with the trial in place. All trials are removed and the cut bone surfaces prepared with pulsatile lavage. Cement is mixed and once ready for implantation, the size 4 tibial implant, size  4 posterior stabilized femoral component, and the size 35 patella are cemented in place and the patella is held with the clamp. The trial insert is placed and the knee held in full extension. The Exparel (20 ml mixed with 30 ml saline) and .25% Bupivicaine, are injected into the extensor mechanism, posterior capsule, medial and lateral gutters and subcutaneous tissues.  All extruded cement is removed and once the cement is hard the permanent 6 mm posterior stabilized rotating platform insert is placed into the tibial tray.      The wound is copiously irrigated with saline solution and the extensor mechanism closed over a hemovac drain with #1 V-loc suture. The tourniquet is released for a total tourniquet time of 34  minutes. Flexion against gravity is 140 degrees and the patella tracks normally. Subcutaneous tissue is closed with 2.0 vicryl and subcuticular with running 4.0 Monocryl. The incision is cleaned and dried and steri-strips and a bulky sterile dressing are applied. The limb is placed into a knee immobilizer and the patient is awakened and transported to recovery in stable condition.      Please note that a surgical assistant was a medical necessity for this procedure in order to perform it in a safe and expeditious manner. Surgical assistant was necessary to retract the ligaments and vital neurovascular structures to prevent injury to them and also necessary for proper positioning of the limb to allow for anatomic placement of the prosthesis.   [name] Fotios Amos, MD    06/10/2013, 10:46 AM

## 2013-06-10 NOTE — Plan of Care (Signed)
Problem: Consults Goal: Diagnosis- Total Joint Replacement Outcome: Completed/Met Date Met:  06/10/13 Primary Total Knee     

## 2013-06-10 NOTE — Anesthesia Preprocedure Evaluation (Addendum)
Anesthesia Evaluation  Patient identified by MRN, date of birth, ID band Patient awake    Reviewed: Allergy & Precautions, H&P , NPO status , Patient's Chart, lab work & pertinent test results  Airway Mallampati: II TM Distance: >3 FB Neck ROM: Full    Dental  (+) Missing, Partial Upper, Lower Dentures, Dental Advisory Given   Pulmonary sleep apnea and Continuous Positive Airway Pressure Ventilation ,  breath sounds clear to auscultation  Pulmonary exam normal       Cardiovascular hypertension, negative cardio ROS  Rhythm:Regular Rate:Normal     Neuro/Psych Depression negative neurological ROS     GI/Hepatic negative GI ROS, Neg liver ROS,   Endo/Other  negative endocrine ROS  Renal/GU negative Renal ROS  negative genitourinary   Musculoskeletal  (+) Arthritis -,   Abdominal   Peds  Hematology negative hematology ROS (+)   Anesthesia Other Findings   Reproductive/Obstetrics                          Anesthesia Physical Anesthesia Plan  ASA: II  Anesthesia Plan: General   Post-op Pain Management:    Induction: Intravenous  Airway Management Planned: Oral ETT  Additional Equipment:   Intra-op Plan:   Post-operative Plan: Extubation in OR  Informed Consent: I have reviewed the patients History and Physical, chart, labs and discussed the procedure including the risks, benefits and alternatives for the proposed anesthesia with the patient or authorized representative who has indicated his/her understanding and acceptance.   Dental advisory given  Plan Discussed with: CRNA  Anesthesia Plan Comments:         Anesthesia Quick Evaluation

## 2013-06-10 NOTE — Interval H&P Note (Signed)
History and Physical Interval Note:  06/10/2013 7:07 AM  Kelsey Church  has presented today for surgery, with the diagnosis of OA RIGHT KNEE  The various methods of treatment have been discussed with the patient and family. After consideration of risks, benefits and other options for treatment, the patient has consented to  Procedure(s): RIGHT TOTAL KNEE ARTHROPLASTY (Right) as a surgical intervention .  The patient's history has been reviewed, patient examined, no change in status, stable for surgery.  I have reviewed the patient's chart and labs.  Questions were answered to the patient's satisfaction.     [name] Evella Kasal

## 2013-06-10 NOTE — Plan of Care (Signed)
Problem: Consults Goal: Diagnosis- Total Joint Replacement Primary Total Knee     

## 2013-06-11 DIAGNOSIS — E871 Hypo-osmolality and hyponatremia: Secondary | ICD-10-CM | POA: Diagnosis not present

## 2013-06-11 DIAGNOSIS — D62 Acute posthemorrhagic anemia: Secondary | ICD-10-CM | POA: Diagnosis not present

## 2013-06-11 DIAGNOSIS — E876 Hypokalemia: Secondary | ICD-10-CM | POA: Diagnosis not present

## 2013-06-11 LAB — BASIC METABOLIC PANEL
BUN: 16 mg/dL (ref 6–23)
CO2: 29 meq/L (ref 19–32)
CREATININE: 0.83 mg/dL (ref 0.50–1.10)
Calcium: 8.6 mg/dL (ref 8.4–10.5)
Chloride: 95 mEq/L — ABNORMAL LOW (ref 96–112)
GFR calc non Af Amer: 74 mL/min — ABNORMAL LOW (ref >90)
GFR, EST AFRICAN AMERICAN: 86 mL/min — AB (ref >90)
GLUCOSE: 160 mg/dL — AB (ref 70–99)
POTASSIUM: 3.5 meq/L — AB (ref 3.7–5.3)
Sodium: 134 mEq/L — ABNORMAL LOW (ref 137–147)

## 2013-06-11 LAB — CBC
HCT: 30.7 % — ABNORMAL LOW (ref 36.0–46.0)
HEMOGLOBIN: 10.2 g/dL — AB (ref 12.0–15.0)
MCH: 30.3 pg (ref 26.0–34.0)
MCHC: 33.2 g/dL (ref 30.0–36.0)
MCV: 91.1 fL (ref 78.0–100.0)
Platelets: 197 10*3/uL (ref 150–400)
RBC: 3.37 MIL/uL — ABNORMAL LOW (ref 3.87–5.11)
RDW: 12.8 % (ref 11.5–15.5)
WBC: 12.2 10*3/uL — AB (ref 4.0–10.5)

## 2013-06-11 NOTE — Progress Notes (Signed)
Utilization review completed.  

## 2013-06-11 NOTE — Discharge Instructions (Addendum)
° °Dr. Frank Aluisio °Total Joint Specialist °[facility] °[address] °[place], [place] [address] °[phone] ° °TOTAL KNEE REPLACEMENT POSTOPERATIVE DIRECTIONS ° ° ° °Knee Rehabilitation, Guidelines Following Surgery  °Results after knee surgery are often greatly improved when you follow the exercise, range of motion and muscle strengthening exercises prescribed by your doctor. Safety measures are also important to protect the knee from further injury. Any time any of these exercises cause you to have increased pain or swelling in your knee joint, decrease the amount until you are comfortable again and slowly increase them. If you have problems or questions, call your caregiver or physical therapist for advice.  ° °HOME CARE INSTRUCTIONS  °Remove items at home which could result in a fall. This includes throw rugs or furniture in walking pathways.  °Continue medications as instructed at time of discharge. °You may have some home medications which will be placed on hold until you complete the course of blood thinner medication.  °You may start showering once you are discharged home but do not submerge the incision under water. Just pat the incision dry and apply a dry gauze dressing on daily. °Walk with walker as instructed.  °You may resume a sexual relationship in one month or when given the OK by  your doctor.  °· Use walker as long as suggested by your caregivers. °· Avoid periods of inactivity such as sitting longer than an hour when not asleep. This helps prevent blood clots.  °You may put full weight on your legs and walk as much as is comfortable.  °You may return to work once you are cleared by your doctor.  °Do not drive a car for 6 weeks or until released by you surgeon.  °· Do not drive while taking narcotics.  °Wear the elastic stockings for three weeks following surgery during the day but you may remove then at night. °Make sure you keep all of your appointments after your  operation with all of your doctors and caregivers. You should call the office at the above phone number and make an appointment for approximately two weeks after the date of your surgery. °Change the dressing daily and reapply a dry dressing each time. °Please pick up a stool softener and laxative for home use as long as you are requiring pain medications. °· Continue to use ice on the knee for pain and swelling from surgery. You may notice swelling that will progress down to the foot and ankle.  This is normal after surgery.  Elevate the leg when you are not up walking on it.   °It is important for you to complete the blood thinner medication as prescribed by your doctor. °· Continue to use the breathing machine which will help keep your temperature down.  It is common for your temperature to cycle up and down following surgery, especially at night when you are not up moving around and exerting yourself.  The breathing machine keeps your lungs expanded and your temperature down. ° °RANGE OF MOTION AND STRENGTHENING EXERCISES  °Rehabilitation of the knee is important following a knee injury or an operation. After just a few days of immobilization, the muscles of the thigh which control the knee become weakened and shrink (atrophy). Knee exercises are designed to build up the tone and strength of the thigh muscles and to improve knee motion. Often times heat used for twenty to thirty minutes before working out will loosen up your tissues and help with improving the   range of motion but do not use heat for the first two weeks following surgery. These exercises can be done on a training (exercise) mat, on the floor, on a table or on a bed. Use what ever works the best and is most comfortable for you Knee exercises include:  Leg Lifts - While your knee is still immobilized in a splint or cast, you can do straight leg raises. Lift the leg to 60 degrees, hold for 3 sec, and slowly lower the leg. Repeat 10-20 times 2-3  times daily. Perform this exercise against resistance later as your knee gets better.  Quad and Hamstring Sets - Tighten up the muscle on the front of the thigh (Quad) and hold for 5-10 sec. Repeat this 10-20 times hourly. Hamstring sets are done by pushing the foot backward against an object and holding for 5-10 sec. Repeat as with quad sets.  A rehabilitation program following serious knee injuries can speed recovery and prevent re-injury in the future due to weakened muscles. Contact your doctor or a physical therapist for more information on knee rehabilitation.   SKILLED REHAB INSTRUCTIONS: If the patient is transferred to a skilled rehab facility following release from the hospital, a list of the current medications will be sent to the facility for the patient to continue.  When discharged from the skilled rehab facility, please have the facility set up the patient's [place] prior to being released. Also, the skilled facility will be responsible for providing the patient with their medications at time of release from the facility to include their pain medication, the muscle relaxants, and their blood thinner medication. If the patient is still at the rehab facility at time of the two week follow up appointment, the skilled rehab facility will also need to assist the patient in arranging follow up appointment in our office and any transportation needs.  MAKE SURE YOU:  Understand these instructions.  Will watch your condition.  Will get help right away if you are not doing well or get worse.    Pick up stool softner and laxative for home. Do not submerge incision under water. May shower. Continue to use ice for pain and swelling from surgery.  Take Xarelto for two and a half more weeks, then discontinue Xarelto. Once the patient has completed the blood thinner regimen, then take a Baby 81 mg Aspirin daily for three more weeks.    Information on my medicine - XARELTO  (Rivaroxaban)  This medication education was reviewed with me or my healthcare representative as part of my discharge preparation.  The pharmacist that spoke with me during my hospital stay was:  Dara Hoyer, [place]  Why was Xarelto prescribed for you? Xarelto was prescribed for you to reduce the risk of blood clots forming after orthopedic surgery. The medical term for these abnormal blood clots is venous thromboembolism (VTE).  What do you need to know about xarelto ? Take your Xarelto ONCE DAILY at the same time every day. You may take it either with or without food.  If you have difficulty swallowing the tablet whole, you may crush it and mix in applesauce just prior to taking your dose.  Take Xarelto exactly as prescribed by your doctor and DO NOT stop taking Xarelto without talking to the doctor who prescribed the medication.  Stopping without other VTE prevention medication to take the place of Xarelto may increase your risk of developing a clot.  After discharge, you should have regular  check-up appointments with your healthcare provider that is prescribing your Xarelto.    What do you do if you miss a dose? If you miss a dose, take it as soon as you remember on the same day then continue your regularly scheduled once daily regimen the next day. Do not take two doses of Xarelto on the same day.   Important Safety Information A possible side effect of Xarelto is bleeding. You should call your healthcare provider right away if you experience any of the following:   Bleeding from an injury or your nose that does not stop.   Unusual colored urine (red or dark brown) or unusual colored stools (red or black).   Unusual bruising for unknown reasons.   A serious fall or if you hit your head (even if there is no bleeding).  Some medicines may interact with Xarelto and might increase your risk of bleeding while on Xarelto. To help avoid this, consult your healthcare provider or  pharmacist prior to using any new prescription or non-prescription medications, including herbals, vitamins, non-steroidal anti-inflammatory drugs (NSAIDs) and supplements.  This website has more information on Xarelto: [email or web address]

## 2013-06-11 NOTE — Evaluation (Signed)
Occupational Therapy Evaluation Patient Details Name: Kelsey Church MRN: [id number] DOB: [birth year] Today's Date: 06/11/2013    History of Present Illness Pt is a 63 year old female s/p R TKA; PMH sleep apnea, CPAP   Clinical Impression   Pt admitted with the above diagnosis and has the deficits listed below. Pt would benefit from cont OT to increase I with basic adls to mod I level so she can live alone in her new condo after her sister leaves.    Follow Up Recommendations  Home health OT;Supervision/Assistance - 24 hour    Equipment Recommendations  3 in 1 bedside comode    Recommendations for Other Services       Precautions / Restrictions Precautions Precautions: Fall;Knee Required Braces or Orthoses: Knee Immobilizer - Right Knee Immobilizer - Right: Discontinue once straight leg raise with < 10 degree lag Restrictions Weight Bearing Restrictions: No LLE Weight Bearing: Weight bearing as tolerated      Mobility Bed Mobility Overal bed mobility: Needs Assistance Bed Mobility: Supine to Sit     Supine to sit: Min guard;HOB elevated     General bed mobility comments: verbal cues for safe technique  Transfers Overall transfer level: Needs assistance Equipment used: Rolling walker (2 wheeled) Transfers: Sit to/from [facility] Sit to Stand: Min guard Stand pivot transfers: Min guard       General transfer comment: verbal cues for safe technique, cues for UE and LE placement for transfer to minimize pain    Balance Overall balance assessment: Needs assistance Sitting-balance support: Bilateral upper extremity supported;Feet supported Sitting balance-Leahy Scale: Good     Standing balance support: Bilateral upper extremity supported;During functional activity Standing balance-Leahy Scale: Fair Standing balance comment: Pt could stand w/o walker but cannot take challenges.                            ADL Overall ADL's : Needs  assistance/impaired Eating/Feeding: Independent;Sitting   Grooming: Wash/dry face;Wash/dry hands;Oral care;Supervision/safety;Standing   Upper Body Bathing: Set up;Sitting   Lower Body Bathing: Minimal assistance;Sit to/from stand Lower Body Bathing Details (indicate cue type and reason): min assist to reach R ankle and foot. Upper Body Dressing : Set up;Sitting   Lower Body Dressing: Moderate assistance;Sit to/from stand Lower Body Dressing Details (indicate cue type and reason): assist for R sock and shoe and assist to get pants started over R foot.  Pt will have better independence once she can take off the knee immobilizer. Toilet Transfer: Min guard;RW;Grab Insurance underwriter Details (indicate cue type and reason): cues for hand positioning and foot positioning Toileting- Clothing Manipulation and Hygiene: Supervision/safety;Sit to/from stand Toileting - Clothing Manipulation Details (indicate cue type and reason): cues to hold to walker while cleaning self. Tub/ Banker:  (discussed at length but did not practice.) Tub/Shower Transfer Details (indicate cue type and reason): Pt will have tub shower for a few days and then a walk in shower. Demonstrated how to get into both.  Pts bathroom at apartement is very small and she may just sponge bathe until she moves to [place] on Fri.  HHOT can assist pt with determining if safe to get into tub at apartment. Functional mobility during ADLs: Min guard;Rolling walker General ADL Comments: Pt did very well with adls.  more assist needed with LE adls due to knee replacement.     Vision  Perception     Praxis      Pertinent Vitals/Pain Pt c/o 5/10 pain.  Nursing brought in pain meds during OT session.     Hand Dominance Right   Extremity/Trunk Assessment Upper Extremity Assessment Upper Extremity Assessment: Overall WFL for tasks assessed   Lower Extremity Assessment Lower Extremity  Assessment: Defer to PT evaluation   Cervical / Trunk Assessment Cervical / Trunk Assessment: Normal   Communication Communication Communication: No difficulties   Cognition Arousal/Alertness: Awake/alert Behavior During Therapy: WFL for tasks assessed/performed Overall Cognitive Status: Within Functional Limits for tasks assessed                     General Comments       Exercises       Shoulder Instructions      Home Living Family/patient expects to be discharged to:: Private residence Living Arrangements: Other relatives Available Help at Discharge: Family Type of Home: Apartment Home Access: Level entry     Home Layout: One level     Bathroom Shower/Tub: Tub/shower unit;Walk-in shower;Door;Curtain Shower/tub characteristics: Architectural technologist: Standard     Home Equipment: Civil engineer, contracting - built in (shower seat at [employer] not apartement)   Additional Comments: pt currently live alone in level entry apartment; on Friday she expects to move into a level entry, one level town home with her sister; pt hired movers to pack and move her belongings; requests a 3 in 1, reports her toilet is too low for her to use      Prior Functioning/Environment Level of Independence: Independent        Comments: pt will have tub shower at apartment for 3 days until she moves to [place] on Fri.  Condo has a walk in shower with a built in seat.      OT Diagnosis: Generalized weakness;Acute pain   OT Problem List: Decreased strength;Decreased knowledge of use of DME or AE;Pain   OT Treatment/Interventions: Self-care/ADL training;DME and/or AE instruction;Therapeutic activities    OT Goals(Current goals can be found in the care plan section) Acute Rehab OT Goals Patient Stated Goal: return home OT Goal Formulation: With patient/family Time For Goal Achievement: 06/18/13 Potential to Achieve Goals: Good ADL Goals Pt Will Perform Lower Body Bathing: with supervision;sit  to/from stand Pt Will Perform Lower Body Dressing: with min assist;sit to/from stand Pt Will Perform Tub/Shower Transfer: Tub transfer;with min assist;3 in 1;ambulating;rolling walker Additional ADL Goal #1: Pt will complete all toileting on 3:1 over commode with S  OT Frequency: Min 2X/week   Barriers to D/C:            Co-evaluation              End of Session Equipment Utilized During Treatment: Rolling walker;Right knee immobilizer Nurse Communication: Mobility status;Patient requests pain meds  Activity Tolerance: Patient tolerated treatment well Patient left: in chair;with call bell/phone within reach;with family/visitor present   Time: [id number] OT Time Calculation (min): 44 min Charges:  OT General Charges $OT Visit: 1 Procedure OT Evaluation $Initial OT Evaluation Tier I: 1 Procedure OT Treatments $Self Care/Home Management : 38-52 mins G-Codes:    Vickki Muff 06-25-13, 9:40 AM [id number]

## 2013-06-11 NOTE — Progress Notes (Signed)
Physical Therapy Treatment Note   06/11/13 1400  PT Visit Information  Last PT Received On 06/11/13  Assistance Needed +1  History of Present Illness Pt is a 63 year old female s/p R TKA; PMH sleep apnea, CPAP  PT Time Calculation  PT Start Time 1354  PT Stop Time 1414  PT Time Calculation (min) 20 min  Subjective Data  Subjective Pt ambulated in hallway, able to tolerate increase in distance however reported 7/10 pain upon returning to room.  Pt medicated just prior to therapy and assisted back to bed.  Pt plans to d/c home tomorrow.  Precautions  Precautions Fall;Knee  Required Braces or Orthoses Knee Immobilizer - Right  Knee Immobilizer - Right Discontinue once straight leg raise with < 10 degree lag  Restrictions  LLE Weight Bearing WBAT  Cognition  Arousal/Alertness Awake/alert  Behavior During Therapy WFL for tasks assessed/performed  Overall Cognitive Status Within Functional Limits for tasks assessed  Bed Mobility  Overal bed mobility Needs Assistance  Bed Mobility Sit to Supine  Sit to supine Supervision  General bed mobility comments verbal cues for L LE assisting R into bed  Transfers  Overall transfer level Needs assistance  Equipment used Rolling walker (2 wheeled)  Transfers Sit to/from Stand  Sit to Stand Min guard  General transfer comment verbal cues for safe technique, cues for UE and LE placement for transfer to minimize pain including L LE forward  Ambulation/Gait  Ambulation/Gait assistance Min guard;Supervision  Ambulation Distance (Feet) 140 Feet  Assistive device Rolling walker (2 wheeled)  Gait Pattern/deviations Step-to pattern;Antalgic;Decreased dorsiflexion - right;Decreased step length - right  Gait velocity decreased  General Gait Details verbal cues for sequence, step length, maintaining heel on floor during stance  PT - End of Session  Equipment Utilized During Treatment Right knee immobilizer  Activity Tolerance Patient tolerated treatment  well  Patient left in bed;with call bell/phone within reach;with family/visitor present  PT - Assessment/Plan  PT Plan Current plan remains appropriate  PT Frequency 7X/week  Follow Up Recommendations Home health PT  PT equipment Rolling walker with 5" wheels;3in1 (PT)  PT Goal Progression  Progress towards PT goals Progressing toward goals  PT General Charges  $$ ACUTE PT VISIT 1 Procedure  PT Treatments  $Gait Training 8-22 mins   Carmelia Bake, PT, DPT 06/11/2013 Pager: [phone]

## 2013-06-11 NOTE — Progress Notes (Signed)
   Subjective: 1 Day Post-Op Procedure(s) (LRB): RIGHT TOTAL KNEE ARTHROPLASTY (Right) Patient reports pain as mild.   Patient seen in rounds with Dr. Wynelle Link. Patient is well, but has had some minor complaints of pain in the knee, requiring pain medications We will start therapy today.  Plan is to go Home after hospital stay.  Objective: Vital signs in last 24 hours: Temp:  [97.2 F (36.2 C)-98.5 F (36.9 C)] 98.4 F (36.9 C) (04/28 0604) Pulse Rate:  [63-107] 81 (04/28 0604) Resp:  [11-20] 16 (04/28 0400) BP: (105-149)/(57-80) 123/65 mmHg (04/28 0604) SpO2:  [95 %-100 %] 98 % (04/28 0604) Weight:  [84.82 kg (186 lb 15.9 oz)] 84.82 kg (186 lb 15.9 oz) (04/27 2000)  Intake/Output from previous day:  Intake/Output Summary (Last 24 hours) at 06/11/13 0910 Last data filed at 06/11/13 0820  Gross per 24 hour  Intake 3047.5 ml  Output   2345 ml  Net  702.5 ml    Intake/Output this shift: Total I/O In: 120 [P.O.:120] Out: -   Labs:  Recent Labs  06/11/13 0441  HGB 10.2*    Recent Labs  06/11/13 0441  WBC 12.2*  RBC 3.37*  HCT 30.7*  PLT 197    Recent Labs  06/11/13 0441  NA 134*  K 3.5*  CL 95*  CO2 29  BUN 16  CREATININE 0.83  GLUCOSE 160*  CALCIUM 8.6   No results found for this basename: LABPT, INR,  in the last 72 hours  EXAM General - Patient is Alert, Appropriate and Oriented Extremity - Neurovascular intact Sensation intact distally Dressing - dressing C/D/I Motor Function - intact, moving foot and toes well on exam.  Hemovac pulled without difficulty.  Past Medical History  Diagnosis Date  . Arthritis   . Melanoma in situ   . Hypertension   . Sleep apnea 09/2011    CPAP  . Sleep apnea   . Depression   . Complication of anesthesia     "chills every evening at sundown x 2 weeks"- no fever    Assessment/Plan: 1 Day Post-Op Procedure(s) (LRB): RIGHT TOTAL KNEE ARTHROPLASTY (Right) Principal Problem:   OA (osteoarthritis) of  knee Active Problems:   OSA (obstructive sleep apnea)   Essential hypertension, benign   Pure hypercholesterolemia   Hyponatremia   Hypokalemia   Postoperative anemia due to acute blood loss  Estimated body mass index is 30.2 kg/(m^2) as calculated from the following:   Height as of this encounter: 5\' 6"  (1.676 m).   Weight as of this encounter: 84.82 kg (186 lb 15.9 oz). Advance diet Up with therapy Plan for discharge tomorrow Discharge home with home health  DVT Prophylaxis - Xarelto Weight-Bearing as tolerated to right leg D/C O2 and Pulse OX and try on Room Air  Kelsey Church 06/11/2013, 9:10 AM

## 2013-06-11 NOTE — Care Management Note (Signed)
    Page 1 of 1   06/11/2013     11:31:13 AM CARE MANAGEMENT NOTE 06/11/2013  Patient:  Kelsey Church,Kelsey Church   Account Number:  [id number]  Date Initiated:  06/11/2013  Documentation initiated by:  [facility]  Subjective/Objective Assessment:   adm: Right Total Knee arthroplasty; LRB     Action/Plan:   discharge planning   Anticipated DC Date:  06/12/2013   Anticipated DC Plan:  [place]  CM consult      Choice offered to / List presented to:     DME arranged  3-N-1  Vassie Moselle      DME agency  [place] arranged  HH-2 PT      [place]   Status of service:  Completed, signed off Medicare Important Message given?   (If response is "NO", the following Medicare IM given date fields will be blank) Date Medicare IM given:   Date Additional Medicare IM given:    Discharge Disposition:  [place]  Per UR Regulation:    If discussed at Long Length of Stay Meetings, dates discussed:    Comments:  06/11/13 11:25 CM made aware pt has chosen [facility] to render HHPT services.  Rolling walker and 3n1 to be delivered to room prior to discharge.  F2F completed.  No other CM needs were communicated.  Mariane Masters, BSN, CM [phone].

## 2013-06-11 NOTE — Progress Notes (Signed)
Physical Therapy Treatment Patient Details Name: Kelsey Church MRN: [id number] DOB: [birth year] Today's Date: 06/11/2013    History of Present Illness Pt is a 63 year old female s/p R TKA; PMH sleep apnea, CPAP    PT Comments    Pt ambulated in hallway and performed LE exercises in recliner.  Pt doing well and plans to d/c home tomorrow.  Follow Up Recommendations  Home health PT     Equipment Recommendations  Rolling walker with 5" wheels;3in1 (PT)    Recommendations for Other Services       Precautions / Restrictions Precautions Precautions: Fall;Knee Required Braces or Orthoses: Knee Immobilizer - Right Knee Immobilizer - Right: Discontinue once straight leg raise with < 10 degree lag Restrictions Weight Bearing Restrictions: No LLE Weight Bearing: Weight bearing as tolerated    Mobility  Bed Mobility Overal bed mobility: Needs Assistance Bed Mobility: Supine to Sit     Supine to sit: Min guard;HOB elevated     General bed mobility comments: pt up in recliner on arrival  Transfers Overall transfer level: Needs assistance Equipment used: Rolling walker (2 wheeled) Transfers: Sit to/from Stand Sit to Stand: Min guard Stand pivot transfers: Min guard       General transfer comment: verbal cues for safe technique, cues for UE and LE placement for transfer to minimize pain including L LE forward  Ambulation/Gait Ambulation/Gait assistance: Min guard Ambulation Distance (Feet): 100 Feet Assistive device: Rolling walker (2 wheeled) Gait Pattern/deviations: Step-to pattern;Antalgic;Decreased dorsiflexion - right;Decreased stance time - right Gait velocity: decreased   General Gait Details: verbal cues for sequence with RW; encouraged heel to toe pattern, pt continues to report feeling muscle tightness in posterior knee, observed decreased heel strike and decreased heel contact during stance phase   Stairs            Wheelchair Mobility    Modified  [place] (Stroke Patients Only)       Balance Overall balance assessment: Needs assistance Sitting-balance support: Bilateral upper extremity supported;Feet supported Sitting balance-Leahy Scale: Good     Standing balance support: Bilateral upper extremity supported;During functional activity Standing balance-Leahy Scale: Fair Standing balance comment: Pt could stand w/o walker but cannot take challenges.                    Cognition Arousal/Alertness: Awake/alert Behavior During Therapy: WFL for tasks assessed/performed Overall Cognitive Status: Within Functional Limits for tasks assessed                      Exercises Total Joint Exercises Ankle Circles/Pumps: AROM;Both;10 reps Quad Sets: AROM;15 reps;Right Short Arc Quad: AROM;Right;10 reps Heel Slides: AROM;10 reps;Right Hip ABduction/ADduction: AROM;10 reps;Right (also performed bil isometric hip adduction with pillow x10) Straight Leg Raises: AAROM;10 reps;Right Goniometric ROM: AAROM R knee flexion 60*    General Comments        Pertinent Vitals/Pain Activity to tolerance, c/o posterior lower leg tightness, repositioned, ice packs applied    Home Living Family/patient expects to be discharged to:: Private residence Living Arrangements: Other relatives Available Help at Discharge: Family Type of Home: Apartment Home Access: Level entry   Home Layout: One level Home Equipment: Shower seat - built in (shower seat at [employer] not apartement) Additional Comments: pt currently live alone in level entry apartment; on Friday she expects to move into a level entry, one level town home with her sister; pt hired movers to pack and move her belongings; requests a 3  in 1, reports her toilet is too low for her to use    Prior Function Level of Independence: Independent      Comments: pt will have tub shower at apartment for 3 days until she moves to [place] on Fri.  Condo has a walk in shower with a built in seat.      PT Goals (current goals can now be found in the care plan section) Acute Rehab PT Goals Patient Stated Goal: return home Progress towards PT goals: Progressing toward goals    Frequency  7X/week    PT Plan Current plan remains appropriate    Co-evaluation             End of Session Equipment Utilized During Treatment: Gait belt;Right knee immobilizer Activity Tolerance: Patient tolerated treatment well Patient left: in chair;with call bell/phone within reach     Time: [id number] PT Time Calculation (min): 32 min  Charges:  $Gait Training: 8-22 mins $Therapeutic Exercise: 8-22 mins                    G Codes:      Junius Argyle 06/11/2013, 12:09 PM Carmelia Bake, PT, DPT 06/11/2013 Pager: [phone]

## 2013-06-12 LAB — BASIC METABOLIC PANEL
BUN: 15 mg/dL (ref 6–23)
CALCIUM: 9.1 mg/dL (ref 8.4–10.5)
CO2: 30 mEq/L (ref 19–32)
Chloride: 98 mEq/L (ref 96–112)
Creatinine, Ser: 0.74 mg/dL (ref 0.50–1.10)
GFR calc Af Amer: >90 mL/min (ref >90)
GFR calc non Af Amer: 89 mL/min — ABNORMAL LOW (ref >90)
GLUCOSE: 129 mg/dL — AB (ref 70–99)
Potassium: 3.1 mEq/L — ABNORMAL LOW (ref 3.7–5.3)
Sodium: 139 mEq/L (ref 137–147)

## 2013-06-12 LAB — CBC
HEMATOCRIT: 28.4 % — AB (ref 36.0–46.0)
HEMOGLOBIN: 9.5 g/dL — AB (ref 12.0–15.0)
MCH: 30.2 pg (ref 26.0–34.0)
MCHC: 33.5 g/dL (ref 30.0–36.0)
MCV: 90.2 fL (ref 78.0–100.0)
Platelets: 187 10*3/uL (ref 150–400)
RBC: 3.15 MIL/uL — ABNORMAL LOW (ref 3.87–5.11)
RDW: 12.9 % (ref 11.5–15.5)
WBC: 14.7 10*3/uL — ABNORMAL HIGH (ref 4.0–10.5)

## 2013-06-12 MED ORDER — TRAMADOL HCL 50 MG PO TABS: 50.0000 mg | ORAL_TABLET | Freq: Four times a day (QID) | ORAL | Status: DC | PRN

## 2013-06-12 MED ORDER — METHOCARBAMOL 500 MG PO TABS: 500.0000 mg | ORAL_TABLET | Freq: Four times a day (QID) | ORAL | Status: DC | PRN

## 2013-06-12 MED ORDER — OXYCODONE HCL 5 MG PO TABS: 5.0000 mg | ORAL_TABLET | ORAL | Status: DC | PRN

## 2013-06-12 MED ORDER — RIVAROXABAN 10 MG PO TABS: 10.0000 mg | ORAL_TABLET | Freq: Every day | ORAL | Status: DC

## 2013-06-12 NOTE — Discharge Summary (Signed)
Physician Discharge Summary   Patient ID: DEEDRA PRO MRN: [id number] DOB/AGE: [birth year] 63 y.o.  Admit date: 06/10/2013 Discharge date: 06-12-2013  Primary Diagnosis:  Osteoarthritis Right knee(s)  Admission Diagnoses:  Past Medical History  Diagnosis Date  . Arthritis   . Melanoma in situ   . Hypertension   . Sleep apnea 09/2011    CPAP  . Sleep apnea   . Depression   . Complication of anesthesia     "chills every evening at sundown x 2 weeks"- no fever   Discharge Diagnoses:   Principal Problem:   OA (osteoarthritis) of knee Active Problems:   OSA (obstructive sleep apnea)   Essential hypertension, benign   Hyponatremia   Hypokalemia   Postoperative anemia due to acute blood loss  Estimated body mass index is 30.2 kg/(m^2) as calculated from the following:   Height as of this encounter: '5\' 6"'  (1.676 m).   Weight as of this encounter: 84.82 kg (186 lb 15.9 oz).  Procedure:  Procedure(s) (LRB): RIGHT TOTAL KNEE ARTHROPLASTY (Right)   Consults: None  HPI: Kelsey Church is a 63 y.o. year year old female with end stage OA of her right knee with progressively worsening pain and dysfunction. She has constant pain, with activity and at rest and significant functional deficits with difficulties even with ADLs. She has had extensive non-op management including analgesics, injections of cortisone and viscosupplements, and home exercise program, but remains in significant pain with significant dysfunction.Radiographs show bone on bone arthritis lateral and patellofemoral with large valgus deformity. She presents now for right Total Knee Arthroplasty.  Laboratory Data: Admission on 06/10/2013, Discharged on 06/12/2013  Component Date Value Ref Range Status  . WBC 06/11/2013 12.2* 4.0 - 10.5 K/uL Final  . RBC 06/11/2013 3.37* 3.87 - 5.11 MIL/uL Final  . Hemoglobin 06/11/2013 10.2* 12.0 - 15.0 g/dL Final  . HCT 06/11/2013 30.7* 36.0 - 46.0 % Final  . MCV 06/11/2013 91.1  78.0  - 100.0 fL Final  . MCH 06/11/2013 30.3  26.0 - 34.0 pg Final  . MCHC 06/11/2013 33.2  30.0 - 36.0 g/dL Final  . RDW 06/11/2013 12.8  11.5 - 15.5 % Final  . Platelets 06/11/2013 197  150 - 400 K/uL Final  . Sodium 06/11/2013 134* 137 - 147 mEq/L Final  . Potassium 06/11/2013 3.5* 3.7 - 5.3 mEq/L Final  . Chloride 06/11/2013 95* 96 - 112 mEq/L Final  . CO2 06/11/2013 29  19 - 32 mEq/L Final  . Glucose, Bld 06/11/2013 160* 70 - 99 mg/dL Final  . BUN 06/11/2013 16  6 - 23 mg/dL Final  . Creatinine, Ser 06/11/2013 0.83  0.50 - 1.10 mg/dL Final  . Calcium 06/11/2013 8.6  8.4 - 10.5 mg/dL Final  . GFR calc non Af Amer 06/11/2013 74* >90 mL/min Final  . GFR calc Af Amer 06/11/2013 86* >90 mL/min Final   Comment: (NOTE)                          The eGFR has been calculated using the CKD EPI equation.                          This calculation has not been validated in all clinical situations.                          eGFR's persistently <90 mL/min signify possible Chronic  Kidney                          Disease.  . WBC 06/12/2013 14.7* 4.0 - 10.5 K/uL Final  . RBC 06/12/2013 3.15* 3.87 - 5.11 MIL/uL Final  . Hemoglobin 06/12/2013 9.5* 12.0 - 15.0 g/dL Final  . HCT 06/12/2013 28.4* 36.0 - 46.0 % Final  . MCV 06/12/2013 90.2  78.0 - 100.0 fL Final  . MCH 06/12/2013 30.2  26.0 - 34.0 pg Final  . MCHC 06/12/2013 33.5  30.0 - 36.0 g/dL Final  . RDW 06/12/2013 12.9  11.5 - 15.5 % Final  . Platelets 06/12/2013 187  150 - 400 K/uL Final  . Sodium 06/12/2013 139  137 - 147 mEq/L Final  . Potassium 06/12/2013 3.1* 3.7 - 5.3 mEq/L Final  . Chloride 06/12/2013 98  96 - 112 mEq/L Final  . CO2 06/12/2013 30  19 - 32 mEq/L Final  . Glucose, Bld 06/12/2013 129* 70 - 99 mg/dL Final  . BUN 06/12/2013 15  6 - 23 mg/dL Final  . Creatinine, Ser 06/12/2013 0.74  0.50 - 1.10 mg/dL Final  . Calcium 06/12/2013 9.1  8.4 - 10.5 mg/dL Final  . GFR calc non Af Amer 06/12/2013 89* >90 mL/min Final  . GFR calc Af Amer  06/12/2013 >90  >90 mL/min Final   Comment: (NOTE)                          The eGFR has been calculated using the CKD EPI equation.                          This calculation has not been validated in all clinical situations.                          eGFR's persistently <90 mL/min signify possible Chronic Kidney                          Disease.  Hospital Outpatient Visit on 06/06/2013  Component Date Value Ref Range Status  . MRSA, PCR 06/06/2013 NEGATIVE  NEGATIVE Final  . Staphylococcus aureus 06/06/2013 POSITIVE* NEGATIVE Final   Comment:                                 The Xpert SA Assay (FDA                          approved for NASAL specimens                          in patients over 9 years of age),                          is one component of                          a comprehensive surveillance                          program.  Test performance has  been validated by [facility] for patients greater                          than or equal to 105 year old.                          It is not intended                          to diagnose infection nor to                          guide or monitor treatment.  Marland Kitchen aPTT 06/06/2013 29  24 - 37 seconds Final  . WBC 06/06/2013 7.3  4.0 - 10.5 K/uL Final  . RBC 06/06/2013 4.82  3.87 - 5.11 MIL/uL Final  . Hemoglobin 06/06/2013 14.6  12.0 - 15.0 g/dL Final  . HCT 06/06/2013 43.7  36.0 - 46.0 % Final  . MCV 06/06/2013 90.7  78.0 - 100.0 fL Final  . MCH 06/06/2013 30.3  26.0 - 34.0 pg Final  . MCHC 06/06/2013 33.4  30.0 - 36.0 g/dL Final  . RDW 06/06/2013 12.9  11.5 - 15.5 % Final  . Platelets 06/06/2013 239  150 - 400 K/uL Final  . Sodium 06/06/2013 143  137 - 147 mEq/L Final  . Potassium 06/06/2013 4.2  3.7 - 5.3 mEq/L Final  . Chloride 06/06/2013 100  96 - 112 mEq/L Final  . CO2 06/06/2013 31  19 - 32 mEq/L Final  . Glucose, Bld 06/06/2013 89  70 - 99 mg/dL Final  . BUN 06/06/2013  29* 6 - 23 mg/dL Final  . Creatinine, Ser 06/06/2013 0.92  0.50 - 1.10 mg/dL Final  . Calcium 06/06/2013 10.1  8.4 - 10.5 mg/dL Final  . Total Protein 06/06/2013 7.6  6.0 - 8.3 g/dL Final  . Albumin 06/06/2013 4.3  3.5 - 5.2 g/dL Final  . AST 06/06/2013 15  0 - 37 U/L Final  . ALT 06/06/2013 18  0 - 35 U/L Final  . Alkaline Phosphatase 06/06/2013 84  39 - 117 U/L Final  . Total Bilirubin 06/06/2013 0.4  0.3 - 1.2 mg/dL Final  . GFR calc non Af Amer 06/06/2013 65* >90 mL/min Final  . GFR calc Af Amer 06/06/2013 76* >90 mL/min Final   Comment: (NOTE)                          The eGFR has been calculated using the CKD EPI equation.                          This calculation has not been validated in all clinical situations.                          eGFR's persistently <90 mL/min signify possible Chronic Kidney                          Disease.  Marland Kitchen Prothrombin Time 06/06/2013 12.3  11.6 - 15.2 seconds Final  . INR 06/06/2013 0.93  0.00 - 1.49 Final  .  ABO/RH(D) 06/06/2013 B POS   Final  . Antibody Screen 06/06/2013 NEG   Final  . Sample Expiration 06/06/2013 06/13/2013   Final  . Color, Urine 06/06/2013 YELLOW  YELLOW Final  . APPearance 06/06/2013 CLEAR  CLEAR Final  . Specific Gravity, Urine 06/06/2013 1.017  1.005 - 1.030 Final  . pH 06/06/2013 7.5  5.0 - 8.0 Final  . Glucose, UA 06/06/2013 NEGATIVE  NEGATIVE mg/dL Final  . Hgb urine dipstick 06/06/2013 NEGATIVE  NEGATIVE Final  . Bilirubin Urine 06/06/2013 NEGATIVE  NEGATIVE Final  . Ketones, ur 06/06/2013 NEGATIVE  NEGATIVE mg/dL Final  . Protein, ur 06/06/2013 NEGATIVE  NEGATIVE mg/dL Final  . Urobilinogen, UA 06/06/2013 1.0  0.0 - 1.0 mg/dL Final  . Nitrite 06/06/2013 NEGATIVE  NEGATIVE Final  . Leukocytes, UA 06/06/2013 NEGATIVE  NEGATIVE Final   MICROSCOPIC NOT DONE ON URINES WITH NEGATIVE PROTEIN, BLOOD, LEUKOCYTES, NITRITE, OR GLUCOSE <1000 mg/dL.  . ABO/RH(D) 06/06/2013 B POS   Final     X-Rays:Dg Chest 2 View  06/06/2013    CLINICAL DATA:  Preoperative examination (right total knee replacement), history of hypertension, nonsmoker  EXAM: CHEST  2 VIEW  COMPARISON:  None.  FINDINGS: Normal cardiac silhouette and mediastinal contours given slightly reduced lung volumes. Veiling opacities overlying the bilateral lower lungs are favored to represent overlying breast tissues. No focal airspace opacities. No pleural effusion or pneumothorax. No evidence of edema. No acute osseus abnormalities. Post cholecystectomy.  IMPRESSION: No acute cardiopulmonary disease.   Electronically Signed   By: Sandi Mariscal M.D.   On: 06/06/2013 15:32    EKG: Orders placed during the hospital encounter of 06/06/13  . EKG 12-LEAD  . EKG 12-LEAD     Hospital Course: AYLEEN MCKINSTRY is a 63 y.o. who was admitted to [facility]. They were brought to the operating room on 06/10/2013 and underwent Procedure(s): RIGHT TOTAL KNEE ARTHROPLASTY.  Patient tolerated the procedure well and was later transferred to the recovery room and then to the orthopaedic floor for postoperative care.  They were given PO and IV analgesics for pain control following their surgery.  They were given 24 hours of postoperative antibiotics of  Anti-infectives   Start     Dose/Rate Route Frequency Ordered Stop   06/10/13 1400  ceFAZolin (ANCEF) IVPB 2 g/50 mL premix     2 g 100 mL/hr over 30 Minutes Intravenous Every 6 hours 06/10/13 1252 06/10/13 2114   06/10/13 0846  ceFAZolin (ANCEF) IVPB 2 g/50 mL premix     2 g 100 mL/hr over 30 Minutes Intravenous On call to O.R. 06/10/13 7290 06/10/13 0937     and started on DVT prophylaxis in the form of Xarelto.   PT and OT were ordered for total joint protocol.  Discharge planning consulted to help with postop disposition and equipment needs.  Patient had a tough night on the evening of surgery.  They started to get up OOB with therapy on day one. Hemovac drain was pulled without difficulty.  Continued to work with therapy  into day two.  Dressing was changed on day two and the incision was healing well.  Patient was seen in rounds and was ready to go home.   Discharge Medications: Prior to Admission medications   Medication Sig Start Date End Date Taking? Authorizing Provider  acetaminophen (TYLENOL) 650 MG CR tablet Take 650 mg by mouth every 8 (eight) hours as needed for pain.    Yes Historical Provider, MD  citalopram (CELEXA) 20 MG tablet Take 20 mg by mouth at bedtime.   Yes Historical Provider, MD  estrogen, conjugated,-medroxyprogesterone (PREMPRO) 0.625-2.5 MG per tablet Take 1 tablet by mouth every evening.   Yes Historical Provider, MD  losartan-hydrochlorothiazide (HYZAAR) 100-25 MG per tablet Take 1 tablet by mouth every morning.   Yes Historical Provider, MD  meloxicam (MOBIC) 15 MG tablet Take 15 mg by mouth daily.   Yes Historical Provider, MD  methocarbamol (ROBAXIN) 500 MG tablet Take 1 tablet (500 mg total) by mouth every 6 (six) hours as needed for muscle spasms. 06/12/13   Alexzandrew Perkins, PA-C  oxyCODONE (OXY IR/ROXICODONE) 5 MG immediate release tablet Take 1-2 tablets (5-10 mg total) by mouth every 3 (three) hours as needed for moderate pain, severe pain or breakthrough pain. 06/12/13   Alexzandrew Dara Lords, PA-C  rivaroxaban (XARELTO) 10 MG TABS tablet Take 1 tablet (10 mg total) by mouth daily with breakfast. Take Xarelto for two and a half more weeks, then discontinue Xarelto. Once the patient has completed the blood thinner regimen, then take a Baby 81 mg Aspirin daily for three more weeks. 06/12/13   Alexzandrew Perkins, PA-C  traMADol (ULTRAM) 50 MG tablet Take 1 tablet (50 mg total) by mouth every 6 (six) hours as needed (mild to moderate). 06/12/13   Alexzandrew Dara Lords, PA-C   Discharge home with home health  Diet - Cardiac diet  Follow up - in 2 weeks  Activity - WBAT  Disposition - Home  Condition Upon Discharge - Good  D/C Meds - See DC Summary  DVT Prophylaxis - Xarelto         Discharge Orders   Future Appointments Provider Department Dept Phone   07/02/2013 1:15 PM Gypsy Lore, [place] [phone]   Future Orders Complete By Expires   Call MD / Call 911  As directed    Change dressing  As directed    Constipation Prevention  As directed    Diet - low sodium heart healthy  As directed    Discharge instructions  As directed    Do not put a pillow under the knee. Place it under the heel.  As directed    Do not sit on low chairs, stoools or toilet seats, as it may be difficult to get up from low surfaces  As directed    Driving restrictions  As directed    Increase activity slowly as tolerated  As directed    Lifting restrictions  As directed    Patient may shower  As directed    TED hose  As directed    Weight bearing as tolerated  As directed    Questions:     Laterality:     Extremity:         Medication List    STOP taking these medications       estrogen (conjugated)-medroxyprogesterone 0.625-2.5 MG per tablet  Commonly known as:  PREMPRO     meloxicam 15 MG tablet  Commonly known as:  MOBIC      TAKE these medications       acetaminophen 650 MG CR tablet  Commonly known as:  TYLENOL  Take 650 mg by mouth every 8 (eight) hours as needed for pain.     citalopram 20 MG tablet  Commonly known as:  CELEXA  Take 20 mg by mouth at bedtime.     losartan-hydrochlorothiazide 100-25 MG per tablet  Commonly known as:  HYZAAR  Take 1 tablet by mouth every morning.     methocarbamol 500 MG tablet  Commonly known as:  ROBAXIN  Take 1 tablet (500 mg total) by mouth every 6 (six) hours as needed for muscle spasms.     oxyCODONE 5 MG immediate release tablet  Commonly known as:  Oxy IR/ROXICODONE  Take 1-2 tablets (5-10 mg total) by mouth every 3 (three) hours as needed for moderate pain, severe pain or breakthrough pain.     rivaroxaban 10 MG Tabs tablet  Commonly known as:  XARELTO  - Take 1 tablet  (10 mg total) by mouth daily with breakfast. Take Xarelto for two and a half more weeks, then discontinue Xarelto.  - Once the patient has completed the blood thinner regimen, then take a Baby 81 mg Aspirin daily for three more weeks.     traMADol 50 MG tablet  Commonly known as:  ULTRAM  Take 1 tablet (50 mg total) by mouth every 6 (six) hours as needed (mild to moderate).       Follow-up Information   Follow up with [facility]. (home health physical therapy)    Contact information:   [address] [place] [address] [phone]       Follow up with [facility]. (rolling walker and 3n1 (bedside commode))    Contact information:   [address] [place]  [address] [phone]       Follow up with [name], MD. Schedule an appointment as soon as possible for a visit on 06/25/2013. (Call for appointment time for May 12th)    Specialty:  Orthopedic Surgery   Contact information:   [address] [place] [address] [phone]       Signed: Arlee Muslim 06/27/2013, 11:54 AM

## 2013-06-12 NOTE — Progress Notes (Signed)
Discharge summary sent to payer through [facility]  

## 2013-06-12 NOTE — Progress Notes (Addendum)
Physical Therapy Treatment Patient Details Name: Kelsey Church MRN: [id number] DOB: 1950-03-27 Today's Date: 06/12/2013    History of Present Illness Pt is a 63 year old female s/p R TKA; PMH sleep apnea, CPAP    PT Comments    Pt ambulated in hallway and performed LE exercises.  Pt with difficulty maintaining heel contact on floor during stance so specifically worked on this during ambulation with slight improvement.  Pt to d/c home today.  Discussed safe car transfer.  Also recommended pt sit in recliner and build height by sitting on pillows vs using rocking chair (unsafe).  Pt reports one step in her new home however can avoid this and prefers to practice with HHPT (declined practicing here).  Answered all pt's questions and pt feels ready for d/c.   Follow Up Recommendations  Home health PT     Equipment Recommendations  Rolling walker with 5" wheels;3in1 (PT)    Recommendations for Other Services       Precautions / Restrictions Precautions Precautions: Fall;Knee Required Braces or Orthoses: Knee Immobilizer - Right Knee Immobilizer - Right: Discontinue once straight leg raise with < 10 degree lag Restrictions Weight Bearing Restrictions: No LLE Weight Bearing: Weight bearing as tolerated    Mobility  Bed Mobility                  Transfers Overall transfer level: Needs assistance Equipment used: Rolling walker (2 wheeled) Transfers: Sit to/from Stand Sit to Stand: Supervision Stand pivot transfers: Supervision       General transfer comment: verbal cues for safe technique  Ambulation/Gait Ambulation/Gait assistance: Supervision Ambulation Distance (Feet): 140 Feet Assistive device: Rolling walker (2 wheeled) Gait Pattern/deviations: Decreased dorsiflexion - right;Antalgic;Step-to pattern Gait velocity: decreased   General Gait Details: verbal cues for sequence, step length, maintaining heel on floor during stance, pt concerned with nonheel  contact on the floor so emphasized heel strike and whole foot floor contact during stance throughout ambulation which required increased time   Stairs            Wheelchair Mobility    Modified Rankin (Stroke Patients Only)       Balance                                    Cognition Arousal/Alertness: Awake/alert Behavior During Therapy: WFL for tasks assessed/performed Overall Cognitive Status: Within Functional Limits for tasks assessed                      Exercises Total Joint Exercises Ankle Circles/Pumps: AROM;Both;10 reps Quad Sets: AROM;15 reps;Right Towel Squeeze: AROM;Both;10 reps Short Arc Quad: Right;10 reps;AAROM Heel Slides: AROM;10 reps;Right;Seated Hip ABduction/ADduction: AROM;10 reps;Right Straight Leg Raises: AAROM;10 reps;Right Goniometric ROM: AAROM sitting edge of chair lacking 10* extension and flexion 60*    General Comments        Pertinent Vitals/Pain Premedicated, activity to tolerance, ice packs applied    Home Living                      Prior Function            PT Goals (current goals can now be found in the care plan section) Progress towards PT goals: Progressing toward goals    Frequency  7X/week    PT Plan Current plan remains appropriate    Co-evaluation  End of Session Equipment Utilized During Treatment: Right knee immobilizer Activity Tolerance: Patient tolerated treatment well Patient left: in chair;with call bell/phone within reach;with family/visitor present     Time: [id number] PT Time Calculation (min): 38 min  Charges:  $Gait Training: 23-37 mins $Therapeutic Exercise: 8-22 mins                    G Codes:      Kelsey Church 06/12/2013, 10:39 AM Carmelia Bake, PT, DPT 06/12/2013 Pager: [phone]

## 2013-06-12 NOTE — Progress Notes (Signed)
   Subjective: 2 Days Post-Op Procedure(s) (LRB): RIGHT TOTAL KNEE ARTHROPLASTY (Right) Patient reports pain as mild and moderate.   Patient seen in rounds with Dr. Wynelle Link. Patient is well, but has had some minor complaints of pain in the knee, requiring pain medications Patient is ready to go home today  Objective: Vital signs in last 24 hours: Temp:  [98 F (36.7 C)-98.2 F (36.8 C)] 98 F (36.7 C) (04/29 0500) Pulse Rate:  [76-88] 79 (04/29 0500) Resp:  [14-20] 16 (04/29 0500) BP: (110-148)/(69-80) 123/69 mmHg (04/29 0500) SpO2:  [95 %-99 %] 95 % (04/29 0500)  Intake/Output from previous day:  Intake/Output Summary (Last 24 hours) at 06/12/13 0908 Last data filed at 06/12/13 0817  Gross per 24 hour  Intake 970.75 ml  Output   2100 ml  Net -1129.25 ml    Intake/Output this shift: Total I/O In: 240 [P.O.:240] Out: -   Labs:  Recent Labs  06/11/13 0441 06/12/13 0437  HGB 10.2* 9.5*    Recent Labs  06/11/13 0441 06/12/13 0437  WBC 12.2* 14.7*  RBC 3.37* 3.15*  HCT 30.7* 28.4*  PLT 197 187    Recent Labs  06/11/13 0441 06/12/13 0437  NA 134* 139  K 3.5* 3.1*  CL 95* 98  CO2 29 30  BUN 16 15  CREATININE 0.83 0.74  GLUCOSE 160* 129*  CALCIUM 8.6 9.1   No results found for this basename: LABPT, INR,  in the last 72 hours  EXAM: General - Patient is Alert, Appropriate and Oriented Extremity - Neurovascular intact Sensation intact distally Dorsiflexion/Plantar flexion intact Incision - clean, dry, no drainage, healing Motor Function - intact, moving foot and toes well on exam.   Assessment/Plan: 2 Days Post-Op Procedure(s) (LRB): RIGHT TOTAL KNEE ARTHROPLASTY (Right) Procedure(s) (LRB): RIGHT TOTAL KNEE ARTHROPLASTY (Right) Past Medical History  Diagnosis Date  . Arthritis   . Melanoma in situ   . Hypertension   . Sleep apnea 09/2011    CPAP  . Sleep apnea   . Depression   . Complication of anesthesia     "chills every evening at  sundown x 2 weeks"- no fever   Principal Problem:   OA (osteoarthritis) of knee Active Problems:   OSA (obstructive sleep apnea)   Essential hypertension, benign   Pure hypercholesterolemia   Hyponatremia   Hypokalemia   Postoperative anemia due to acute blood loss  Estimated body mass index is 30.2 kg/(m^2) as calculated from the following:   Height as of this encounter: 5\' 6"  (1.676 m).   Weight as of this encounter: 84.82 kg (186 lb 15.9 oz). Up with therapy Discharge home with home health Diet - Cardiac diet Follow up - in 2 weeks Activity - WBAT Disposition - Home Condition Upon Discharge - Good D/C Meds - See DC Summary DVT Prophylaxis - Xarelto  Kelsey Church 06/12/2013, 9:08 AM

## 2013-06-16 ENCOUNTER — Other Ambulatory Visit: Payer: Self-pay | Admitting: Internal Medicine

## 2013-07-02 ENCOUNTER — Ambulatory Visit: Payer: BC Managed Care – PPO | Attending: Orthopedic Surgery | Admitting: Physical Therapy

## 2013-07-02 DIAGNOSIS — R262 Difficulty in walking, not elsewhere classified: Secondary | ICD-10-CM | POA: Insufficient documentation

## 2013-07-02 DIAGNOSIS — I10 Essential (primary) hypertension: Secondary | ICD-10-CM | POA: Diagnosis not present

## 2013-07-02 DIAGNOSIS — M25569 Pain in unspecified knee: Secondary | ICD-10-CM | POA: Insufficient documentation

## 2013-07-02 DIAGNOSIS — Z96659 Presence of unspecified artificial knee joint: Secondary | ICD-10-CM | POA: Diagnosis not present

## 2013-07-02 DIAGNOSIS — M25669 Stiffness of unspecified knee, not elsewhere classified: Secondary | ICD-10-CM | POA: Diagnosis not present

## 2013-07-02 DIAGNOSIS — R609 Edema, unspecified: Secondary | ICD-10-CM | POA: Insufficient documentation

## 2013-07-02 DIAGNOSIS — IMO0001 Reserved for inherently not codable concepts without codable children: Secondary | ICD-10-CM | POA: Diagnosis present

## 2013-07-05 ENCOUNTER — Ambulatory Visit: Payer: BC Managed Care – PPO | Admitting: Rehabilitation

## 2013-07-10 ENCOUNTER — Ambulatory Visit: Payer: BC Managed Care – PPO | Admitting: Physical Therapy

## 2013-07-10 DIAGNOSIS — IMO0001 Reserved for inherently not codable concepts without codable children: Secondary | ICD-10-CM | POA: Diagnosis not present

## 2013-07-15 ENCOUNTER — Ambulatory Visit: Payer: BC Managed Care – PPO | Attending: Orthopedic Surgery | Admitting: Physical Therapy

## 2013-07-15 DIAGNOSIS — M25569 Pain in unspecified knee: Secondary | ICD-10-CM | POA: Insufficient documentation

## 2013-07-15 DIAGNOSIS — R262 Difficulty in walking, not elsewhere classified: Secondary | ICD-10-CM | POA: Diagnosis not present

## 2013-07-15 DIAGNOSIS — R609 Edema, unspecified: Secondary | ICD-10-CM | POA: Insufficient documentation

## 2013-07-15 DIAGNOSIS — Z96659 Presence of unspecified artificial knee joint: Secondary | ICD-10-CM | POA: Diagnosis not present

## 2013-07-15 DIAGNOSIS — M25669 Stiffness of unspecified knee, not elsewhere classified: Secondary | ICD-10-CM | POA: Insufficient documentation

## 2013-07-15 DIAGNOSIS — IMO0001 Reserved for inherently not codable concepts without codable children: Secondary | ICD-10-CM | POA: Diagnosis present

## 2013-07-15 DIAGNOSIS — I10 Essential (primary) hypertension: Secondary | ICD-10-CM | POA: Insufficient documentation

## 2013-07-17 ENCOUNTER — Ambulatory Visit: Payer: BC Managed Care – PPO | Admitting: Rehabilitation

## 2013-07-17 DIAGNOSIS — IMO0001 Reserved for inherently not codable concepts without codable children: Secondary | ICD-10-CM | POA: Diagnosis not present

## 2013-07-18 ENCOUNTER — Ambulatory Visit: Payer: BC Managed Care – PPO | Admitting: Physical Therapy

## 2013-07-18 DIAGNOSIS — IMO0001 Reserved for inherently not codable concepts without codable children: Secondary | ICD-10-CM | POA: Diagnosis not present

## 2013-07-22 ENCOUNTER — Ambulatory Visit: Payer: BC Managed Care – PPO | Admitting: Physical Therapy

## 2013-07-22 DIAGNOSIS — IMO0001 Reserved for inherently not codable concepts without codable children: Secondary | ICD-10-CM | POA: Diagnosis not present

## 2013-07-24 ENCOUNTER — Ambulatory Visit: Payer: BC Managed Care – PPO | Admitting: Physical Therapy

## 2013-07-24 DIAGNOSIS — IMO0001 Reserved for inherently not codable concepts without codable children: Secondary | ICD-10-CM | POA: Diagnosis not present

## 2013-07-25 ENCOUNTER — Telehealth: Payer: Self-pay

## 2013-07-25 ENCOUNTER — Ambulatory Visit: Payer: BC Managed Care – PPO | Admitting: Rehabilitation

## 2013-07-25 DIAGNOSIS — IMO0001 Reserved for inherently not codable concepts without codable children: Secondary | ICD-10-CM | POA: Diagnosis not present

## 2013-07-25 MED ORDER — PROMETHAZINE-DM 6.25-15 MG/5ML PO SYRP: 5.0000 mL | ORAL_SOLUTION | Freq: Four times a day (QID) | ORAL | Status: DC | PRN

## 2013-07-25 NOTE — Telephone Encounter (Signed)
Pt notified via Mychart

## 2013-07-25 NOTE — Telephone Encounter (Signed)
done

## 2013-07-25 NOTE — Telephone Encounter (Signed)
The patient called and is hoping to get something called in for a cough.  She states she has a thick, mucus producing cough that turns into a dry cough.  She was offered and ov, but was hoping a medication could be called in.

## 2013-07-26 MED ORDER — AZITHROMYCIN 500 MG PO TABS: 500.0000 mg | ORAL_TABLET | Freq: Every day | ORAL | Status: DC

## 2013-07-26 NOTE — Telephone Encounter (Signed)
Kelsey Church states that she got the cough medicine, but is wondering if she needs an antibiotic.  She had knee replacement surgery in April and is concerned about getting an infection in her knee.

## 2013-07-26 NOTE — Addendum Note (Signed)
Addended by: Janith Lima on: 07/26/2013 12:52 PM   Modules accepted: Orders

## 2013-07-29 ENCOUNTER — Ambulatory Visit: Payer: BC Managed Care – PPO | Admitting: Physical Therapy

## 2013-07-31 ENCOUNTER — Ambulatory Visit: Payer: BC Managed Care – PPO | Admitting: Rehabilitation

## 2013-07-31 DIAGNOSIS — IMO0001 Reserved for inherently not codable concepts without codable children: Secondary | ICD-10-CM | POA: Diagnosis not present

## 2013-08-01 ENCOUNTER — Ambulatory Visit: Payer: BC Managed Care – PPO | Admitting: Physical Therapy

## 2013-08-01 DIAGNOSIS — IMO0001 Reserved for inherently not codable concepts without codable children: Secondary | ICD-10-CM | POA: Diagnosis not present

## 2013-08-05 ENCOUNTER — Ambulatory Visit: Payer: BC Managed Care – PPO | Admitting: Rehabilitation

## 2013-08-07 ENCOUNTER — Ambulatory Visit: Payer: BC Managed Care – PPO | Admitting: Rehabilitation

## 2013-08-08 ENCOUNTER — Ambulatory Visit: Payer: BC Managed Care – PPO | Admitting: Rehabilitation

## 2013-08-12 ENCOUNTER — Ambulatory Visit: Payer: BC Managed Care – PPO | Admitting: Rehabilitation

## 2013-08-14 ENCOUNTER — Ambulatory Visit: Payer: BC Managed Care – PPO | Attending: Orthopedic Surgery | Admitting: Rehabilitation

## 2013-08-14 DIAGNOSIS — R262 Difficulty in walking, not elsewhere classified: Secondary | ICD-10-CM | POA: Diagnosis not present

## 2013-08-14 DIAGNOSIS — M25669 Stiffness of unspecified knee, not elsewhere classified: Secondary | ICD-10-CM | POA: Insufficient documentation

## 2013-08-14 DIAGNOSIS — M25569 Pain in unspecified knee: Secondary | ICD-10-CM | POA: Insufficient documentation

## 2013-08-14 DIAGNOSIS — IMO0001 Reserved for inherently not codable concepts without codable children: Secondary | ICD-10-CM | POA: Insufficient documentation

## 2013-08-14 DIAGNOSIS — I10 Essential (primary) hypertension: Secondary | ICD-10-CM | POA: Insufficient documentation

## 2013-08-14 DIAGNOSIS — R609 Edema, unspecified: Secondary | ICD-10-CM | POA: Diagnosis not present

## 2013-08-14 DIAGNOSIS — Z96659 Presence of unspecified artificial knee joint: Secondary | ICD-10-CM | POA: Diagnosis not present

## 2013-08-15 ENCOUNTER — Ambulatory Visit: Payer: BC Managed Care – PPO | Admitting: Rehabilitation

## 2013-08-21 ENCOUNTER — Encounter: Payer: BC Managed Care – PPO | Admitting: Rehabilitation

## 2013-08-26 ENCOUNTER — Encounter: Payer: BC Managed Care – PPO | Admitting: Physical Therapy

## 2013-09-23 ENCOUNTER — Other Ambulatory Visit: Payer: Self-pay

## 2013-09-23 DIAGNOSIS — Z1231 Encounter for screening mammogram for malignant neoplasm of breast: Secondary | ICD-10-CM

## 2013-10-04 ENCOUNTER — Ambulatory Visit (INDEPENDENT_AMBULATORY_CARE_PROVIDER_SITE_OTHER): Payer: BC Managed Care – PPO | Admitting: Family Medicine

## 2013-10-04 ENCOUNTER — Encounter: Payer: Self-pay | Admitting: Family Medicine

## 2013-10-04 ENCOUNTER — Other Ambulatory Visit (INDEPENDENT_AMBULATORY_CARE_PROVIDER_SITE_OTHER): Payer: BC Managed Care – PPO

## 2013-10-04 VITALS — BP 120/84 | HR 83 | Ht 66.0 in | Wt 187.0 lb

## 2013-10-04 DIAGNOSIS — M7551 Bursitis of right shoulder: Secondary | ICD-10-CM | POA: Insufficient documentation

## 2013-10-04 DIAGNOSIS — M25519 Pain in unspecified shoulder: Secondary | ICD-10-CM

## 2013-10-04 DIAGNOSIS — M719 Bursopathy, unspecified: Secondary | ICD-10-CM

## 2013-10-04 DIAGNOSIS — M67919 Unspecified disorder of synovium and tendon, unspecified shoulder: Secondary | ICD-10-CM

## 2013-10-04 DIAGNOSIS — M25511 Pain in right shoulder: Secondary | ICD-10-CM

## 2013-10-04 NOTE — Patient Instructions (Signed)
Very good to see you again.  I am sorry about the fall.  Ice 20 minutes 2 times daily. Usually after activity and before bed. Exercises 3 times a week.  Try pennsaid twice daily for 5 days then as needed.  Come back in 3-4 weeks or when you see Dr. Ronnald Ramp.

## 2013-10-04 NOTE — Assessment & Plan Note (Signed)
The patient was given an injection today. Patient discussed icing protocol, home exercises, topical anti-inflammatories but avoid taking with the meloxicam. Patient can continue the tramadol and Tylenol if necessary. Patient given home exercise program and showed proper technique. Patient did will come back and see me again in 3-4 weeks for further evaluation and treatment.

## 2013-10-04 NOTE — Progress Notes (Signed)
[name] [address] [place], [place] [address] Phone: [phone] Subjective:     CC: Right shoulder pain  [id number] Kelsey Church is a 63 y.o. female coming in with complaint of right shoulder pain. Patient states that she fell 2 months ago. Patient actually had knee replacement surgery 4 months ago. Patient states that when she fell she brace her fall on the right side of her shoulder. Patient states for 2 weeks she was unable to raise his shoulder. Since that time while seen a chiropractor she has improved slowly but continues to have a dull aching pain with mild radiation down the arm. Denies any numbness or any weakness. States that reaching behind her back or overhead can give her a sharp pain with certain movements. Patient states that he can wake her up at night. Patient is a severity of 7 a 10.      Past medical history, social, surgical and family history all reviewed in electronic medical record.   Review of Systems: No headache, visual changes, nausea, vomiting, diarrhea, constipation, dizziness, abdominal pain, skin rash, fevers, chills, night sweats, weight loss, swollen lymph nodes, body aches, joint swelling, muscle aches, chest pain, shortness of breath, mood changes.   Objective Blood pressure 120/84, pulse 83, height 5\' 6"  (1.676 m), weight 187 lb (84.823 kg), SpO2 98.00%.  General: No apparent distress alert and oriented x3 mood and affect normal, dressed appropriately.  HEENT: Pupils equal, extraocular movements intact  Respiratory: Patient's speak in full sentences and does not appear short of breath  Cardiovascular: No lower extremity edema, non tender, no erythema  Skin: Warm dry intact with no signs of infection or rash on extremities or on axial skeleton.  Abdomen: Soft nontender  Neuro: Cranial nerves II through XII are intact, neurovascularly intact in all extremities with 2+ DTRs and 2+ pulses.  Lymph: No  lymphadenopathy of posterior or anterior cervical chain or axillae bilaterally.  Gait normal with good balance and coordination.  MSK:  Non tender with full range of motion and good stability and symmetric strength and tone of  elbows, wrist, hip, knee and ankles bilaterally.  Shoulder: Right Inspection reveals no abnormalities, atrophy or asymmetry. Palpation is normal with no tenderness over AC joint or bicipital groove. ROM is full in all planes passively. Rotator cuff strength normal throughout. signs of impingement with positive Neer and Hawkin's tests, but negative empty can sign. Speeds and Yergason's tests normal. No labral pathology noted with negative Obrien's, negative clunk and good stability. Normal scapular function observed. No painful arc and no drop arm sign. No apprehension sign  MSK US performed of: Right This study was ordered, performed, and interpreted by Charlann Boxer D.O.  Shoulder:   Supraspinatus: Mild calcific changes noted Bursal bulge seen with shoulder abduction on impingement view. Infraspinatus:  Extremely small tear with no significant retraction noted. Significant increase in Doppler flow Subscapularis:  Appears normal on long and transverse views. Positive bursa Teres Minor:  Appears normal on long and transverse views. AC joint:  Capsule undistended, no geyser sign. Glenohumeral Joint:  Appears normal without effusion. Glenoid Labrum:  Intact without visualized tears. Biceps Tendon:  Appears normal on long and transverse views, no fraying of tendon, tendon located in intertubercular groove, no subluxation with shoulder internal or external rotation.  Impression: Subacromial bursitis with mild degenerative as well as acute changes of the rotator cuff  Procedure: Real-time Ultrasound Guided Injection of right glenohumeral joint Device: GE Logiq  E  Ultrasound guided injection is preferred based studies that show increased duration, increased effect,  greater accuracy, decreased procedural pain, increased response rate with ultrasound guided versus blind injection.  Verbal informed consent obtained.  Time-out conducted.  Noted no overlying erythema, induration, or other signs of local infection.  Skin prepped in a sterile fashion.  Local anesthesia: Topical Ethyl chloride.  With sterile technique and under real time ultrasound guidance:  Joint visualized.  23g 1  inch needle inserted posterior approach. Pictures taken for needle placement. Patient did have injection of 2 cc of 1% lidocaine, 2 cc of 0.5% Marcaine, and 1.0 cc of Kenalog 40 mg/dL. Completed without difficulty  Pain immediately resolved suggesting accurate placement of the medication.  Advised to call if fevers/chills, erythema, induration, drainage, or persistent bleeding.  Images permanently stored and available for review in the ultrasound unit.  Impression: Technically successful ultrasound guided injection.     Impression and Recommendations:     This case required medical decision making of moderate complexity.

## 2013-10-08 ENCOUNTER — Telehealth: Payer: Self-pay | Admitting: Family Medicine

## 2013-10-08 MED ORDER — DICLOFENAC SODIUM 2 % TD SOLN: TRANSDERMAL | Status: DC

## 2013-10-08 NOTE — Telephone Encounter (Signed)
Called pt, unable to leave a msg.

## 2013-10-08 NOTE — Telephone Encounter (Signed)
Patient states that the Pennsaid medication she was given samples of his working for her and wants to know if she can have a prescription sent to her [facility] pharmacy on file. Please advise.

## 2013-10-09 ENCOUNTER — Ambulatory Visit
Admission: RE | Admit: 2013-10-09 | Discharge: 2013-10-09 | Disposition: A | Discharge status: Home / Self Care | Payer: BC Managed Care – PPO | Source: Ambulatory Visit

## 2013-10-09 DIAGNOSIS — Z1231 Encounter for screening mammogram for malignant neoplasm of breast: Secondary | ICD-10-CM

## 2013-10-09 IMAGING — MG MM SCREENING BREAST TOMO BILATERAL
8 series · 8 of 24 positions shown · non-contrast
Comparison: Previous exam(s).

CLINICAL DATA: Screening.

EXAM:
DIGITAL SCREENING BILATERAL MAMMOGRAM WITH 3D TOMO WITH CAD
DIGITAL BREAST TOMOSYNTHESIS
Digital breast tomosynthesis images are acquired in two projections.
These images are reviewed in combination with the digital mammogram,
confirming the findings below.

[L MLO]
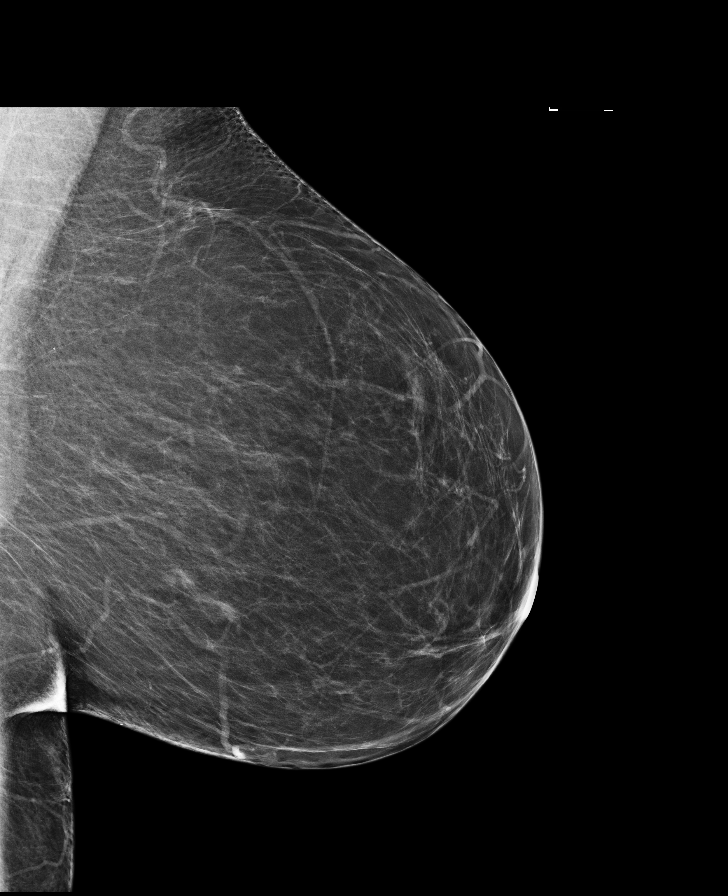

[R MLO]
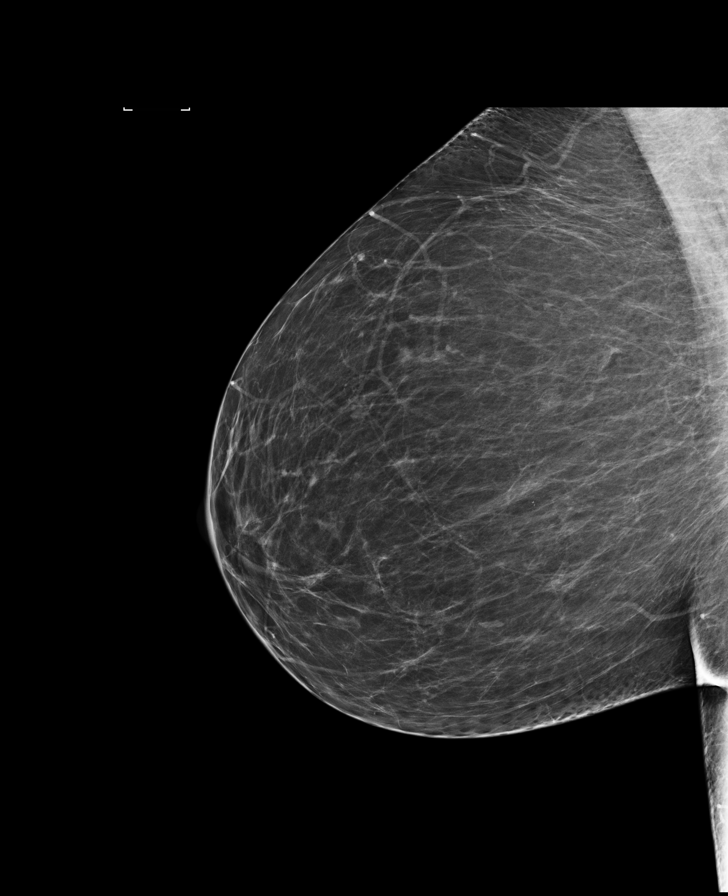

[L CC]
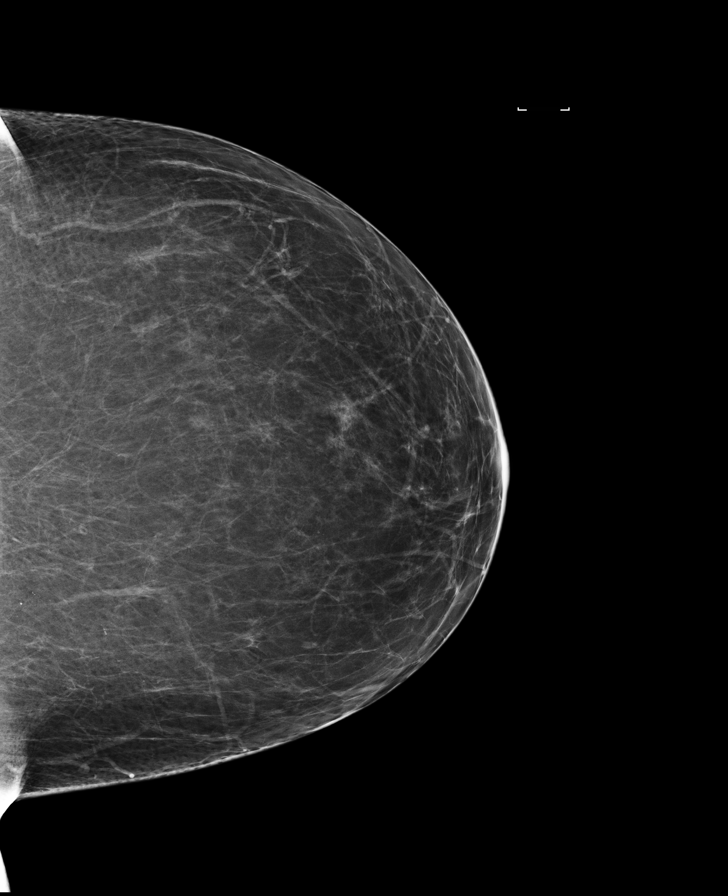

[R CC]
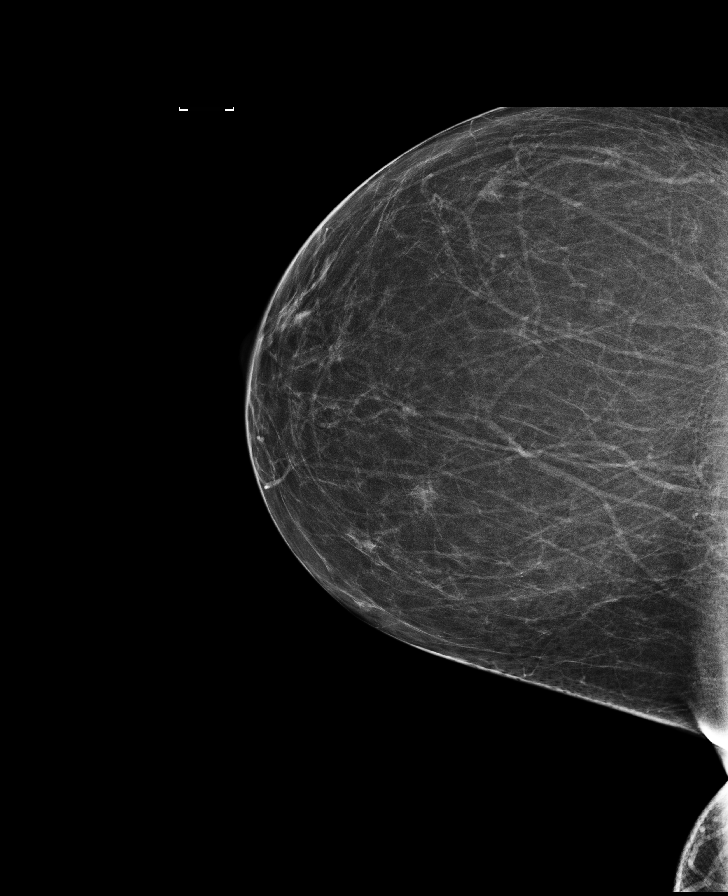

[R CC tomo · tomo slice 39/76.0]
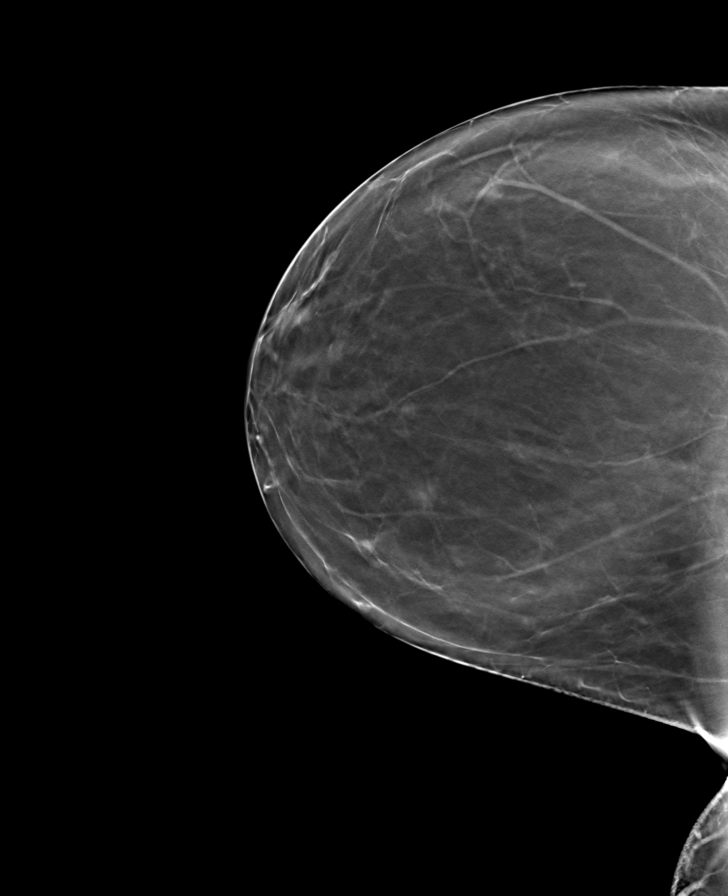

[L CC tomo · tomo slice 37/72.0]
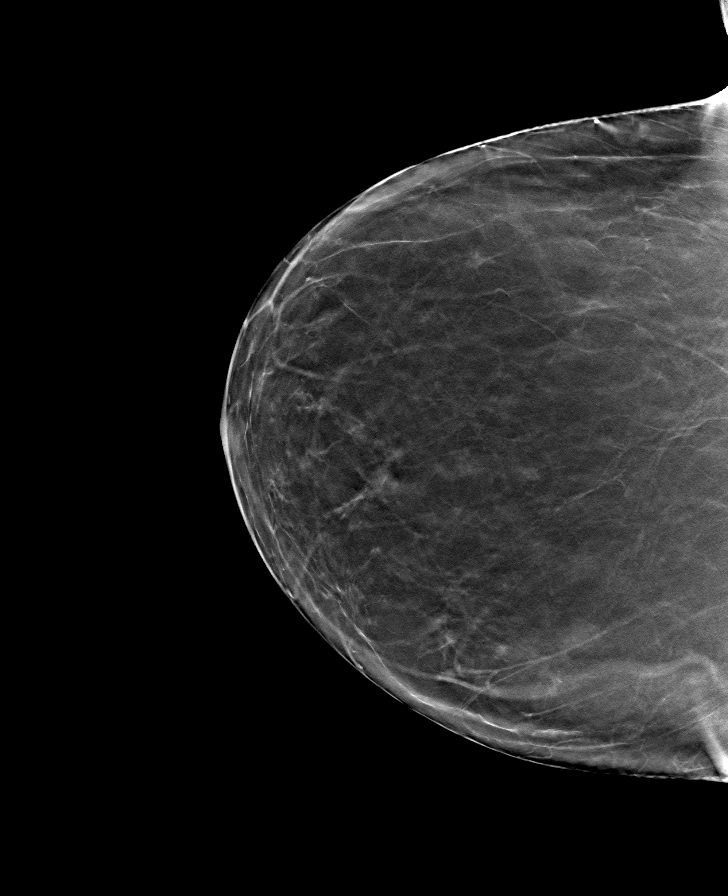

[L MLO tomo · tomo slice 39/78.0]
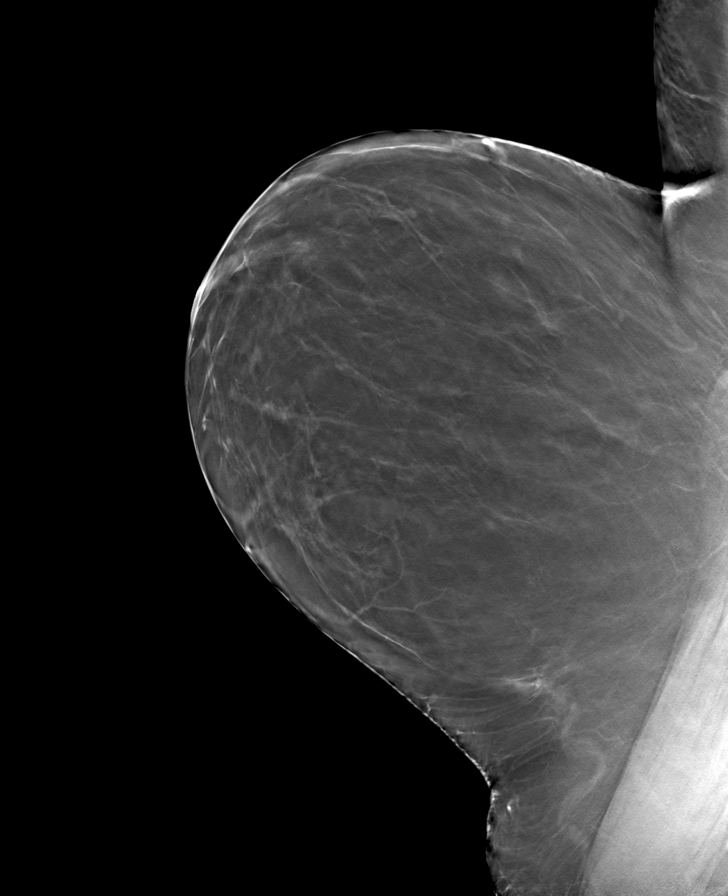

[R MLO tomo · tomo slice 39/76.0]
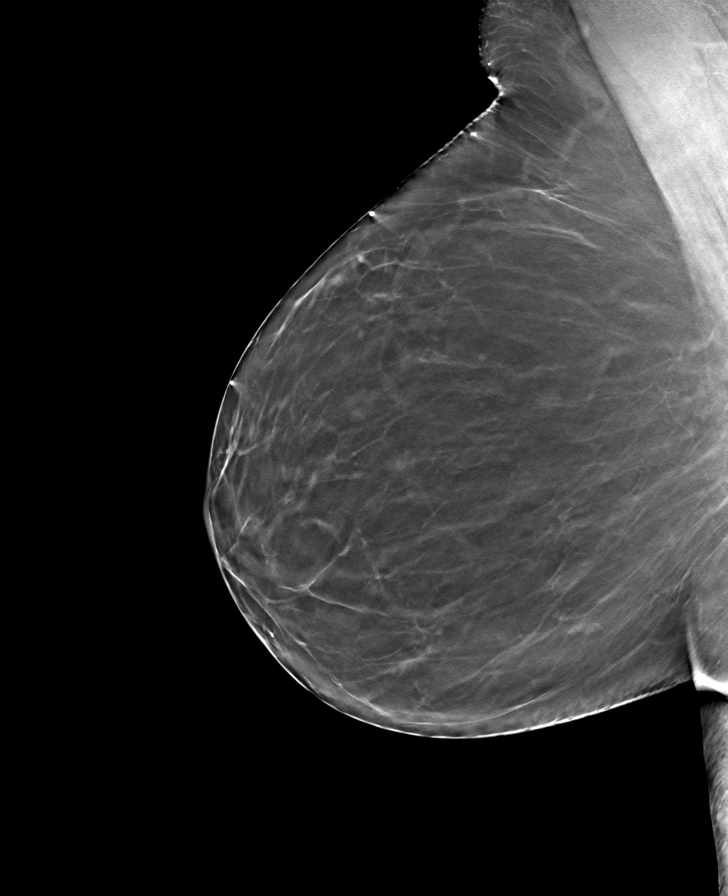

[8 of 24 positions shown; findings below may reference images not displayed]

ACR Breast Density Category b: There are scattered areas of
fibroglandular density.
FINDINGS: There are no findings suspicious for malignancy. Images were
processed with CAD.
IMPRESSION: No mammographic evidence of malignancy. A result letter of this
screening mammogram will be mailed directly to the patient.

RECOMMENDATION:
Screening mammogram in one year. (Code:[GR])

BI-RADS CATEGORY  1: Negative.

## 2013-10-10 LAB — HM MAMMOGRAPHY: HM Mammogram: NORMAL

## 2013-10-11 ENCOUNTER — Encounter: Payer: BC Managed Care – PPO | Admitting: Internal Medicine

## 2013-10-17 ENCOUNTER — Encounter: Payer: Self-pay | Admitting: Internal Medicine

## 2013-10-25 ENCOUNTER — Telehealth: Payer: Self-pay | Admitting: Internal Medicine

## 2013-10-25 ENCOUNTER — Ambulatory Visit: Payer: BC Managed Care – PPO | Admitting: Internal Medicine

## 2013-10-25 ENCOUNTER — Ambulatory Visit (INDEPENDENT_AMBULATORY_CARE_PROVIDER_SITE_OTHER): Payer: BC Managed Care – PPO | Admitting: Internal Medicine

## 2013-10-25 ENCOUNTER — Encounter: Payer: Self-pay | Admitting: Internal Medicine

## 2013-10-25 VITALS — BP 130/78 | HR 62 | Temp 98.4°F | Resp 16 | Ht 66.0 in | Wt 184.0 lb

## 2013-10-25 DIAGNOSIS — B001 Herpesviral vesicular dermatitis: Secondary | ICD-10-CM | POA: Insufficient documentation

## 2013-10-25 DIAGNOSIS — B009 Herpesviral infection, unspecified: Secondary | ICD-10-CM | POA: Diagnosis not present

## 2013-10-25 MED ORDER — VALACYCLOVIR HCL 1 G PO TABS: 1000.0000 mg | ORAL_TABLET | Freq: Two times a day (BID) | ORAL | Status: DC

## 2013-10-25 NOTE — Patient Instructions (Signed)
We will send in the valtrex for your fever blister and you take 1 pill 2 times per day for 3 days.   We recommend talking to the infectious disease clinic about your trip to [place] for malaria medicine and stomach medicine.   [facility]) [place], [place] [address] [phone]  Cold Sore A cold sore (fever blister) is a skin infection caused by the herpes simplex virus (HSV-1). HSV-1 is closely related to the virus that causes genital herpes (HSV-2), but they are not the same even though both viruses can cause oral and genital infections. Cold sores are small, fluid-filled sores inside of the mouth or on the lips, gums, nose, chin, cheeks, or fingers.  The herpes simplex virus can be easily passed (contagious) to other people through close personal contact, such as kissing or sharing personal items. The virus can also spread to other parts of the body, such as the eyes or genitals. Cold sores are contagious until the sores crust over completely. They often heal within 2 weeks.  Once a person is infected, the herpes simplex virus remains permanently in the body. Therefore, there is no cure for cold sores, and they often recur when a person is tired, stressed, sick, or gets too much sun. Additional factors that can cause a recurrence include hormone changes in menstruation or pregnancy, certain drugs, and cold weather.  CAUSES  Cold sores are caused by the herpes simplex virus. The virus is spread from person to person through close contact, such as through kissing, touching the affected area, or sharing personal items such as lip balm, razors, or eating utensils.  SYMPTOMS  The first infection may not cause symptoms. If symptoms develop, the symptoms often go through different stages. Here is how a cold sore develops:   Tingling, itching, or burning is felt 1-2 days before the outbreak.   Fluid-filled blisters  appear on the lips, inside the mouth, nose, or on the cheeks.   The blisters start to ooze clear fluid.   The blisters dry up and a yellow crust appears in its place.   The crust falls off.  Symptoms depend on whether it is the initial outbreak or a recurrence. Some other symptoms with the first outbreak may include:   Fever.   Sore throat.   Headache.   Muscle aches.   Swollen neck glands.  DIAGNOSIS  A diagnosis is often made based on your symptoms and looking at the sores. Sometimes, a sore may be swabbed and then examined in the lab to make a final diagnosis. If the sores are not present, blood tests can find the herpes simplex virus.  TREATMENT  There is no cure for cold sores and no vaccine for the herpes simplex virus. Within 2 weeks, most cold sores go away on their own without treatment. Medicines cannot make the infection go away, but medicine can help relieve some of the pain associated with the sores, can work to stop the virus from multiplying, and can also shorten healing time. Medicine may be in the form of creams, gels, pills, or a shot.  HOME CARE INSTRUCTIONS   Only take over-the-counter or prescription medicines for pain, discomfort, or fever as directed by your caregiver. Do not use aspirin.   Use a cotton-tip swab to apply creams or gels to your sores.   Do not touch the sores or pick the scabs. Wash your hands often. Do not  touch your eyes without washing your hands first.   Avoid kissing, oral sex, and sharing personal items until sores heal.   Apply an ice pack on your sores for 10-15 minutes to ease any discomfort.   Avoid hot, cold, or salty foods because they may hurt your mouth. Eat a soft, bland diet to avoid irritating the sores. Use a straw to drink if you have pain when drinking out of a glass.   Keep sores clean and dry to prevent an infection of other tissues.   Avoid the sun and limit stress if these things trigger outbreaks. If  sun causes cold sores, apply sunscreen on the lips before being out in the sun.  SEEK MEDICAL CARE IF:   You have a fever or persistent symptoms for more than 2-3 days.   You have a fever and your symptoms suddenly get worse.   You have pus, not clear fluid, coming from the sores.   You have redness that is spreading.   You have pain or irritation in your eye.   You get sores on your genitals.   Your sores do not heal within 2 weeks.   You have a weakened immune system.   You have frequent recurrences of cold sores.  MAKE SURE YOU:   Understand these instructions.  Will watch your condition.  Will get help right away if you are not doing well or get worse. Document Released: 01/29/2000 Document Revised: 06/17/2013 Document Reviewed: 06/15/2011 [facility] Patient Information 2015 [place], [place]. This information is not intended to replace advice given to you by your health care provider. Make sure you discuss any questions you have with your health care provider.

## 2013-10-25 NOTE — Progress Notes (Signed)
   Subjective:    Patient ID: Kelsey Church, female    DOB: Jul 25, 1950, 63 y.o.   MRN: [id number]  HPI The patient is a 63 YO female who is coming in today for an acute visit for a fever blister on her lip. It came about 1-2 weeks ago and has been troubling her since that time. She has been under some stress planning for a trip to [place]. She is wondering if we can talk about what she needs to do before that trip for her health. She is leaving Oct 2 for a 10 day mission trip. She denies fevers, chills. She denies other problems and denies other lesions or blisters. She has not changed medication during that time or doses.   Review of Systems  Constitutional: Negative for fever, chills, activity change, appetite change and fatigue.  HENT: Positive for mouth sores. Negative for congestion, facial swelling, nosebleeds, postnasal drip, sore throat and trouble swallowing.   Eyes: Negative for discharge and itching.  Respiratory: Negative for cough, chest tightness, shortness of breath and wheezing.   Cardiovascular: Negative for chest pain, palpitations and leg swelling.  Gastrointestinal: Negative.       Objective:   Physical Exam  Constitutional: She appears well-developed and well-nourished.  HENT:  Head: Normocephalic and atraumatic.  Nose: Nose normal.  Mouth/Throat: Oropharynx is clear and moist.  A sore on her upper left lip tender to touch.  Eyes: EOM are normal.  Neck: Normal range of motion. No JVD present. No thyromegaly present.  Cardiovascular: Normal rate and regular rhythm.   Pulmonary/Chest: Effort normal and breath sounds normal.  Lymphadenopathy:    She has no cervical adenopathy.      Assessment & Plan:  Spoke with patient briefly about vaccinations but advised her to call [facility] for their travel clinic and advised her in the future to schedule a dedicated visit for traveling out of the country well in advance as some vaccinations and medications may have to be given  up to 6 weeks prior to travel.

## 2013-10-25 NOTE — Telephone Encounter (Signed)
Patient needed valtrex sent to [facility] on [address].

## 2013-10-25 NOTE — Assessment & Plan Note (Signed)
Will give patient valtrex 1 gm BID for 3 days to ensure resolution prior to trip.

## 2013-10-25 NOTE — Progress Notes (Signed)
Pre visit review using our clinic review tool, if applicable. No additional management support is needed unless otherwise documented below in the visit note. 

## 2013-10-31 LAB — HM PAP SMEAR: HM Pap smear: NORMAL

## 2013-11-01 ENCOUNTER — Ambulatory Visit (INDEPENDENT_AMBULATORY_CARE_PROVIDER_SITE_OTHER): Payer: BC Managed Care – PPO | Admitting: Internal Medicine

## 2013-11-01 DIAGNOSIS — Z9189 Other specified personal risk factors, not elsewhere classified: Secondary | ICD-10-CM

## 2013-11-01 MED ORDER — ATOVAQUONE-PROGUANIL HCL 250-100 MG PO TABS: 1.0000 | ORAL_TABLET | Freq: Every day | ORAL | Status: DC

## 2013-11-01 MED ORDER — CIPROFLOXACIN HCL 500 MG PO TABS: 500.0000 mg | ORAL_TABLET | Freq: Two times a day (BID) | ORAL | Status: DC

## 2013-11-12 ENCOUNTER — Encounter: Payer: Self-pay | Admitting: Family Medicine

## 2013-11-12 ENCOUNTER — Ambulatory Visit (INDEPENDENT_AMBULATORY_CARE_PROVIDER_SITE_OTHER): Payer: BC Managed Care – PPO | Admitting: Family Medicine

## 2013-11-12 VITALS — BP 114/80 | HR 85 | Ht 66.0 in | Wt 188.0 lb

## 2013-11-12 DIAGNOSIS — M67919 Unspecified disorder of synovium and tendon, unspecified shoulder: Secondary | ICD-10-CM

## 2013-11-12 DIAGNOSIS — M7551 Bursitis of right shoulder: Secondary | ICD-10-CM

## 2013-11-12 DIAGNOSIS — M719 Bursopathy, unspecified: Secondary | ICD-10-CM

## 2013-11-12 NOTE — Progress Notes (Signed)
   Subjective:    Patient ID: Kelsey Church, female    DOB: 12-28-1950, 63 y.o.   MRN: [id number]  HPI Kelsey Church isa [name] who has previously traveled to [place] will be going to volunteer in [place] with [employer] on [employer]rest[employer]. She will be going for 12 days leaving oct 2nd thru oct 12th.  uptodate on tdap 05/2013, typhoid 2014, [place] 2014, hep B and hep A  Current Outpatient Prescriptions on File Prior to Visit  Medication Sig Dispense Refill  . acetaminophen (TYLENOL) 650 MG CR tablet Take 650 mg by mouth every 8 (eight) hours as needed for pain.       . citalopram (CELEXA) 20 MG tablet Take 20 mg by mouth daily.       . Diclofenac Sodium 2 % SOLN Apply twice daily.  112 g  1  . estrogen, conjugated,-medroxyprogesterone (PREMPRO) 0.625-2.5 MG per tablet Take 1 tablet by mouth daily.       Marland Kitchen losartan-hydrochlorothiazide (HYZAAR) 100-25 MG per tablet TAKE ONE TABLET BY MOUTH ONE TIME DAILY   90 tablet  1  . promethazine-dextromethorphan (PROMETHAZINE-DM) 6.25-15 MG/5ML syrup Take 5 mLs by mouth 4 (four) times daily as needed for cough.  118 mL  0  . traMADol (ULTRAM) 50 MG tablet Take 1 tablet (50 mg total) by mouth every 6 (six) hours as needed (mild to moderate).  60 tablet  1   No current facility-administered medications on file prior to visit.   Active Ambulatory Problems    Diagnosis Date Noted  . OSA (obstructive sleep apnea) 07/29/2011  . Essential hypertension, benign 07/29/2011  . Pure hypercholesterolemia 07/29/2011  . Routine general medical examination at a health care facility 07/29/2011  . DJD (degenerative joint disease) of knee 09/09/2011  . Other screening mammogram 09/25/2012  . OA (osteoarthritis) of knee 06/10/2013  . Hyponatremia 06/11/2013  . Hypokalemia 06/11/2013  . Postoperative anemia due to acute blood loss 06/11/2013  . Shoulder bursitis 10/04/2013  . Cold sore 10/25/2013   Resolved Ambulatory Problems    Diagnosis Date Noted  .  DOE (dyspnea on exertion) 07/29/2011  . Knee pain, right 09/09/2011  . Foot injury 09/06/2012   Past Medical History  Diagnosis Date  . Arthritis   . Melanoma in situ   . Hypertension   . Sleep apnea 09/2011  . Sleep apnea   . Depression   . Complication of anesthesia    Has not received this year's flu shot   Review of Systems     Objective:   Physical Exam        Assessment & Plan:  Travel vaccines are up to date  Malaria proph = recommend to take malarone  Traveler's diarrhea = gave rx for cipro

## 2013-11-12 NOTE — Assessment & Plan Note (Signed)
Patient is doing extremely well with conservative therapy and one cortical steroid injection. Patient did have a small rotator cuff tear and would like to check it again in 3-4 weeks with ultrasound. Patient come back at that time then we will do an ultrasound and further evaluation. Patient will continue the exercises and gentle stretch exercises and see me date we discussed.

## 2013-11-12 NOTE — Progress Notes (Signed)
  [facility] [place], [place] [address] Phone: [phone] Subjective:     CC: Right shoulder pain follow up  [id number] [name] is a 63 y.o. female coming in with complaint of right shoulder pain. Patient sent of the subacromial and her sinus as well as mild osteophytic changes and rotator cuff tendinopathy. Patient was given an injection, home exercises, icing protocol. Patient states she is approximately 90% better. Patient some is some mild discomfort when she is reaching behind her back try to pick something up but otherwise doing well. Patient has started her exercise classes again. Denies any new symptoms. Overall feeling well. Patient is scheduled to have knee replacement surgery November 2      Past medical history, social, surgical and family history all reviewed in electronic medical record.   Review of Systems: No headache, visual changes, nausea, vomiting, diarrhea, constipation, dizziness, abdominal pain, skin rash, fevers, chills, night sweats, weight loss, swollen lymph nodes, body aches, joint swelling, muscle aches, chest pain, shortness of breath, mood changes.   Objective Blood pressure 114/80, pulse 85, height 5\' 6"  (1.676 m), weight 188 lb (85.276 kg), SpO2 98.00%.  General: No apparent distress alert and oriented x3 mood and affect normal, dressed appropriately.  HEENT: Pupils equal, extraocular movements intact  Respiratory: Patient's speak in full sentences and does not appear short of breath  Cardiovascular: No lower extremity edema, non tender, no erythema  Skin: Warm dry intact with no signs of infection or rash on extremities or on axial skeleton.  Abdomen: Soft nontender  Neuro: Cranial nerves II through XII are intact, neurovascularly intact in all extremities with 2+ DTRs and 2+ pulses.  Lymph: No lymphadenopathy of posterior or anterior cervical chain or axillae bilaterally.  Gait normal with good  balance and coordination.  MSK:  Non tender with full range of motion and good stability and symmetric strength and tone of  elbows, wrist, hip, knee and ankles bilaterally.  Shoulder: Right Inspection reveals no abnormalities, atrophy or asymmetry. Palpation is normal with no tenderness over AC joint or bicipital groove. ROM is full in all planes passively. Rotator cuff strength normal throughout. Mild signs of impingement but significant improvement. Speeds and Yergason's tests normal. No labral pathology noted with negative Obrien's, negative clunk and good stability. Normal scapular function observed. No painful arc and no drop arm sign. No apprehension sign Contralateral shoulder unremarkable.       Impression and Recommendations:     This case required medical decision making of moderate complexity.

## 2013-11-12 NOTE — Patient Instructions (Signed)
Very good to see you You are doing great! Ice is your friend.  Heels, butt shoulder and head touching for goal of 5 minutes daily.  Continue the pennsaid as you need it.  Good luck with your knee.  See me when you need me.

## 2013-11-28 ENCOUNTER — Other Ambulatory Visit: Payer: Self-pay | Admitting: Orthopedic Surgery

## 2013-11-28 NOTE — Progress Notes (Signed)
Preoperative surgical orders have been place into the Epic hospital system for [employer] on 11/28/2013, 12:46 PM  by Mickel Crow for surgery on 12/16/2013.  Preop Total Knee orders including Experal, IV Tylenol, and IV Decadron as long as there are no contraindications to the above medications. Arlee Muslim, PA-C

## 2013-12-03 ENCOUNTER — Encounter: Payer: Self-pay | Admitting: Internal Medicine

## 2013-12-03 ENCOUNTER — Other Ambulatory Visit (INDEPENDENT_AMBULATORY_CARE_PROVIDER_SITE_OTHER): Payer: BC Managed Care – PPO

## 2013-12-03 ENCOUNTER — Encounter: Payer: Self-pay | Admitting: Family Medicine

## 2013-12-03 ENCOUNTER — Ambulatory Visit (INDEPENDENT_AMBULATORY_CARE_PROVIDER_SITE_OTHER): Payer: BC Managed Care – PPO | Admitting: Internal Medicine

## 2013-12-03 ENCOUNTER — Ambulatory Visit (INDEPENDENT_AMBULATORY_CARE_PROVIDER_SITE_OTHER): Payer: BC Managed Care – PPO | Admitting: Family Medicine

## 2013-12-03 VITALS — BP 128/68 | HR 75 | Temp 98.2°F | Resp 16 | Ht 66.0 in | Wt 184.0 lb

## 2013-12-03 VITALS — BP 128/68 | HR 75 | Ht 66.0 in | Wt 184.0 lb

## 2013-12-03 DIAGNOSIS — M25511 Pain in right shoulder: Secondary | ICD-10-CM

## 2013-12-03 DIAGNOSIS — I10 Essential (primary) hypertension: Secondary | ICD-10-CM

## 2013-12-03 DIAGNOSIS — Z0000 Encounter for general adult medical examination without abnormal findings: Secondary | ICD-10-CM

## 2013-12-03 DIAGNOSIS — Z23 Encounter for immunization: Secondary | ICD-10-CM

## 2013-12-03 DIAGNOSIS — R739 Hyperglycemia, unspecified: Secondary | ICD-10-CM

## 2013-12-03 DIAGNOSIS — R7303 Prediabetes: Secondary | ICD-10-CM | POA: Insufficient documentation

## 2013-12-03 DIAGNOSIS — M7551 Bursitis of right shoulder: Secondary | ICD-10-CM

## 2013-12-03 LAB — COMPREHENSIVE METABOLIC PANEL
ALBUMIN: 3.5 g/dL (ref 3.5–5.2)
ALT: 16 U/L (ref 0–35)
AST: 15 U/L (ref 0–37)
Alkaline Phosphatase: 79 U/L (ref 39–117)
BUN: 24 mg/dL — AB (ref 6–23)
CO2: 23 mEq/L (ref 19–32)
CREATININE: 1.1 mg/dL (ref 0.4–1.2)
Calcium: 9.4 mg/dL (ref 8.4–10.5)
Chloride: 102 mEq/L (ref 96–112)
GFR: 54.99 mL/min — ABNORMAL LOW (ref >60.00)
Glucose, Bld: 101 mg/dL — ABNORMAL HIGH (ref 70–99)
Potassium: 3.9 mEq/L (ref 3.5–5.1)
Sodium: 140 mEq/L (ref 135–145)
Total Bilirubin: 0.7 mg/dL (ref 0.2–1.2)
Total Protein: 7.7 g/dL (ref 6.0–8.3)

## 2013-12-03 LAB — CBC WITH DIFFERENTIAL/PLATELET
BASOS PCT: 0.4 % (ref 0.0–3.0)
Basophils Absolute: 0 10*3/uL (ref 0.0–0.1)
EOS ABS: 0.2 10*3/uL (ref 0.0–0.7)
Eosinophils Relative: 2.1 % (ref 0.0–5.0)
HCT: 41.5 % (ref 36.0–46.0)
HEMOGLOBIN: 13.7 g/dL (ref 12.0–15.0)
LYMPHS ABS: 1.5 10*3/uL (ref 0.7–4.0)
Lymphocytes Relative: 14.9 % (ref 12.0–46.0)
MCHC: 33 g/dL (ref 30.0–36.0)
MCV: 86.1 fl (ref 78.0–100.0)
Monocytes Absolute: 0.5 10*3/uL (ref 0.1–1.0)
Monocytes Relative: 5 % (ref 3.0–12.0)
NEUTROS ABS: 7.6 10*3/uL (ref 1.4–7.7)
Neutrophils Relative %: 77.6 % — ABNORMAL HIGH (ref 43.0–77.0)
Platelets: 319 10*3/uL (ref 150.0–400.0)
RBC: 4.83 Mil/uL (ref 3.87–5.11)
RDW: 15 % (ref 11.5–15.5)
WBC: 9.7 10*3/uL (ref 4.0–10.5)

## 2013-12-03 LAB — LIPID PANEL
Cholesterol: 196 mg/dL (ref 0–200)
HDL: 62.7 mg/dL (ref >39.00)
LDL Cholesterol: 110 mg/dL — ABNORMAL HIGH (ref 0–99)
NONHDL: 133.3
TRIGLYCERIDES: 116 mg/dL (ref 0.0–149.0)
Total CHOL/HDL Ratio: 3
VLDL: 23.2 mg/dL (ref 0.0–40.0)

## 2013-12-03 LAB — HEMOGLOBIN A1C: Hgb A1c MFr Bld: 6.1 % (ref 4.6–6.5)

## 2013-12-03 LAB — TSH: TSH: 1.4 u[IU]/mL (ref 0.35–4.50)

## 2013-12-03 NOTE — Progress Notes (Signed)
Pre visit review using our clinic review tool, if applicable. No additional management support is needed unless otherwise documented below in the visit note. 

## 2013-12-03 NOTE — Assessment & Plan Note (Signed)
The patient continues to do remarkably well after the injection. Patient does have moderate osteophytic changes of the shoulder then made cause exacerbations later on. We discussed that patients with her upcoming knee surgery that she is going to need to be careful with transitioning as well as supporting her weight. We discussed continuing the icing and home exercises to 3 times a week. Patient will followup with me on an as-needed basis.

## 2013-12-03 NOTE — Progress Notes (Signed)
  [facility] [place], [place] [address] Phone: [phone] Subjective:    CC: Right shoulder pain follow up  [id number] Kelsey Church is a 63 y.o. female coming in with complaint of right shoulder pain. Patient sent of the subacromial and her sinus as well as mild osteophytic changes and rotator cuff tendinopathy. Patient was seen 3 weeks ago and was doing approximately 90% better. Patient is scheduled to have knee replacement surgery on November 2. Patient states she continues to do well overall. Patient would like to make sure that patient's tear that was seen previously is completely healed. Patient denies any new symptoms. Patient states that overall she is doing relatively well and is not taking any pain medications at this time. Only uses the topical anti-inflammatory sparingly.      Past medical history, social, surgical and family history all reviewed in electronic medical record.   Review of Systems: No headache, visual changes, nausea, vomiting, diarrhea, constipation, dizziness, abdominal pain, skin rash, fevers, chills, night sweats, weight loss, swollen lymph nodes, body aches, joint swelling, muscle aches, chest pain, shortness of breath, mood changes.   Objective Blood pressure 128/68, pulse 75, height 5\' 6"  (1.676 m), weight 184 lb (83.462 kg), SpO2 96.00%.  General: No apparent distress alert and oriented x3 mood and affect normal, dressed appropriately.  HEENT: Pupils equal, extraocular movements intact  Respiratory: Patient's speak in full sentences and does not appear short of breath  Cardiovascular: No lower extremity edema, non tender, no erythema  Skin: Warm dry intact with no signs of infection or rash on extremities or on axial skeleton.  Abdomen: Soft nontender  Neuro: Cranial nerves II through XII are intact, neurovascularly intact in all extremities with 2+ DTRs and 2+ pulses.  Lymph: No lymphadenopathy of  posterior or anterior cervical chain or axillae bilaterally.  Gait normal with good balance and coordination.  MSK:  Non tender with full range of motion and good stability and symmetric strength and tone of  elbows, wrist, hip, knee and ankles bilaterally.  Shoulder: Right Inspection reveals no abnormalities, atrophy or asymmetry. Palpation is normal with no tenderness over AC joint or bicipital groove. ROM is full in all planes passively. Rotator cuff strength normal throughout. Mild signs of impingement but significant improvement. Speeds and Yergason's tests normal. No labral pathology noted with negative Obrien's, negative clunk and good stability. Normal scapular function observed. No painful arc and no drop arm sign. No apprehension sign Contralateral shoulder unremarkable.   MSK US performed of: Right  This study was ordered, performed, and interpreted by Charlann Boxer D.O.  Shoulder:  Supraspinatus significant improvement in the calcific changes seen previously but still degenerative changes. Subscapularis: Appears normal on long and transverse views. Teres Minor: Appears normal on long and transverse views.  AC joint: Capsule undistended, no geyser sign.  Glenohumeral Joint: Appears normal without effusion. Mild to moderate joint arthritis noted Glenoid Labrum: Intact without visualized tears.  Biceps Tendon: Appears normal on long and transverse views, no fraying of tendon, tendon located in intertubercular groove, no subluxation with shoulder internal or external rotation.  Impression: Subacromial bursitis with mild degenerative as well as acute changes of the rotator cuff as well as joint space.     Impression and Recommendations:     This case required medical decision making of moderate complexity.

## 2013-12-03 NOTE — Patient Instructions (Signed)
Hypertension Hypertension, commonly called high blood pressure, is when the force of blood pumping through your arteries is too strong. Your arteries are the blood vessels that carry blood from your heart throughout your body. A blood pressure reading consists of a higher number over a lower number, such as 110/72. The higher number (systolic) is the pressure inside your arteries when your heart pumps. The lower number (diastolic) is the pressure inside your arteries when your heart relaxes. Ideally you want your blood pressure below 120/80. Hypertension forces your heart to work harder to pump blood. Your arteries may become narrow or stiff. Having hypertension puts you at risk for heart disease, stroke, and other problems.  RISK FACTORS Some risk factors for high blood pressure are controllable. Others are not.  Risk factors you cannot control include:   Race. You may be at higher risk if you are African American.  Age. Risk increases with age.  Gender. Men are at higher risk than women before age 45 years. After age 65, women are at higher risk than men. Risk factors you can control include:  Not getting enough exercise or physical activity.  Being overweight.  Getting too much fat, sugar, calories, or salt in your diet.  Drinking too much alcohol. SIGNS AND SYMPTOMS Hypertension does not usually cause signs or symptoms. Extremely high blood pressure (hypertensive crisis) may cause headache, anxiety, shortness of breath, and nosebleed. DIAGNOSIS  To check if you have hypertension, your health care provider will measure your blood pressure while you are seated, with your arm held at the level of your heart. It should be measured at least twice using the same arm. Certain conditions can cause a difference in blood pressure between your right and left arms. A blood pressure reading that is higher than normal on one occasion does not mean that you need treatment. If one blood pressure reading  is high, ask your health care provider about having it checked again. TREATMENT  Treating high blood pressure includes making lifestyle changes and possibly taking medicine. Living a healthy lifestyle can help lower high blood pressure. You may need to change some of your habits. Lifestyle changes may include:  Following the DASH diet. This diet is high in fruits, vegetables, and whole grains. It is low in salt, red meat, and added sugars.  Getting at least 2 hours of brisk physical activity every week.  Losing weight if necessary.  Not smoking.  Limiting alcoholic beverages.  Learning ways to reduce stress. If lifestyle changes are not enough to get your blood pressure under control, your health care provider may prescribe medicine. You may need to take more than one. Work closely with your health care provider to understand the risks and benefits. HOME CARE INSTRUCTIONS  Have your blood pressure rechecked as directed by your health care provider.   Take medicines only as directed by your health care provider. Follow the directions carefully. Blood pressure medicines must be taken as prescribed. The medicine does not work as well when you skip doses. Skipping doses also puts you at risk for problems.   Do not smoke.   Monitor your blood pressure at home as directed by your health care provider. SEEK MEDICAL CARE IF:   You think you are having a reaction to medicines taken.  You have recurrent headaches or feel dizzy.  You have swelling in your ankles.  You have trouble with your vision. SEEK IMMEDIATE MEDICAL CARE IF:  You develop a severe headache or confusion.    You have unusual weakness, numbness, or feel faint.  You have severe chest or abdominal pain.  You vomit repeatedly.  You have trouble breathing. MAKE SURE YOU:   Understand these instructions.  Will watch your condition.  Will get help right away if you are not doing well or get worse. Document  Released: 01/31/2005 Document Revised: 06/17/2013 Document Reviewed: 11/23/2012 [facility] Patient Information 2015 [place], [place]. This information is not intended to replace advice given to you by your health care provider. Make sure you discuss any questions you have with your health care provider. Preventive Care for Adults A healthy lifestyle and preventive care can promote health and wellness. Preventive health guidelines for women include the following key practices.  A routine yearly physical is a good way to check with your health care provider about your health and preventive screening. It is a chance to share any concerns and updates on your health and to receive a thorough exam.  Visit your dentist for a routine exam and preventive care every 6 months. Brush your teeth twice a day and floss once a day. Good oral hygiene prevents tooth decay and gum disease.  The frequency of eye exams is based on your age, health, family medical history, use of contact lenses, and other factors. Follow your health care provider's recommendations for frequency of eye exams.  Eat a healthy diet. Foods like vegetables, fruits, whole grains, low-fat dairy products, and lean protein foods contain the nutrients you need without too many calories. Decrease your intake of foods high in solid fats, added sugars, and salt. Eat the right amount of calories for you.Get information about a proper diet from your health care provider, if necessary.  Regular physical exercise is one of the most important things you can do for your health. Most adults should get at least 150 minutes of moderate-intensity exercise (any activity that increases your heart rate and causes you to sweat) each week. In addition, most adults need muscle-strengthening exercises on 2 or more days a week.  Maintain a healthy weight. The body mass index (BMI) is a screening tool to identify possible weight problems. It provides an estimate of body fat  based on height and weight. Your health care provider can find your BMI and can help you achieve or maintain a healthy weight.For adults 20 years and older:  A BMI below 18.5 is considered underweight.  A BMI of 18.5 to 24.9 is normal.  A BMI of 25 to 29.9 is considered overweight.  A BMI of 30 and above is considered obese.  Maintain normal blood lipids and cholesterol levels by exercising and minimizing your intake of saturated fat. Eat a balanced diet with plenty of fruit and vegetables. Blood tests for lipids and cholesterol should begin at age 43 and be repeated every 5 years. If your lipid or cholesterol levels are high, you are over 50, or you are at high risk for heart disease, you may need your cholesterol levels checked more frequently.Ongoing high lipid and cholesterol levels should be treated with medicines if diet and exercise are not working.  If you smoke, find out from your health care provider how to quit. If you do not use tobacco, do not start.  Lung cancer screening is recommended for adults aged 21-80 years who are at high risk for developing lung cancer because of a history of smoking. A yearly low-dose CT scan of the lungs is recommended for people who have at least a 30-pack-year history of smoking  and are a current smoker or have quit within the past 15 years. A pack year of smoking is smoking an average of 1 pack of cigarettes a day for 1 year (for example: 1 pack a day for 30 years or 2 packs a day for 15 years). Yearly screening should continue until the smoker has stopped smoking for at least 15 years. Yearly screening should be stopped for people who develop a health problem that would prevent them from having lung cancer treatment.  If you are pregnant, do not drink alcohol. If you are breastfeeding, be very cautious about drinking alcohol. If you are not pregnant and choose to drink alcohol, do not have more than 1 drink per day. One drink is considered to be 12  ounces (355 mL) of beer, 5 ounces (148 mL) of wine, or 1.5 ounces (44 mL) of liquor.  Avoid use of street drugs. Do not share needles with anyone. Ask for help if you need support or instructions about stopping the use of drugs.  High blood pressure causes heart disease and increases the risk of stroke. Your blood pressure should be checked at least every 1 to 2 years. Ongoing high blood pressure should be treated with medicines if weight loss and exercise do not work.  If you are 54-39 years old, ask your health care provider if you should take aspirin to prevent strokes.  Diabetes screening involves taking a blood sample to check your fasting blood sugar level. This should be done once every 3 years, after age 66, if you are within normal weight and without risk factors for diabetes. Testing should be considered at a younger age or be carried out more frequently if you are overweight and have at least 1 risk factor for diabetes.  Breast cancer screening is essential preventive care for women. You should practice "breast self-awareness." This means understanding the normal appearance and feel of your breasts and may include breast self-examination. Any changes detected, no matter how small, should be reported to a health care provider. Women in their 71s and 30s should have a clinical breast exam (CBE) by a health care provider as part of a regular health exam every 1 to 3 years. After age 42, women should have a CBE every year. Starting at age 47, women should consider having a mammogram (breast X-ray test) every year. Women who have a family history of breast cancer should talk to their health care provider about genetic screening. Women at a high risk of breast cancer should talk to their health care providers about having an MRI and a mammogram every year.  Breast cancer gene (BRCA)-related cancer risk assessment is recommended for women who have family members with BRCA-related cancers.  BRCA-related cancers include breast, ovarian, tubal, and peritoneal cancers. Having family members with these cancers may be associated with an increased risk for harmful changes (mutations) in the breast cancer genes BRCA1 and BRCA2. Results of the assessment will determine the need for genetic counseling and BRCA1 and BRCA2 testing.  Routine pelvic exams to screen for cancer are no longer recommended for nonpregnant women who are considered low risk for cancer of the pelvic organs (ovaries, uterus, and vagina) and who do not have symptoms. Ask your health care provider if a screening pelvic exam is right for you.  If you have had past treatment for cervical cancer or a condition that could lead to cancer, you need Pap tests and screening for cancer for at least 20 years after  your treatment. If Pap tests have been discontinued, your risk factors (such as having a new sexual partner) need to be reassessed to determine if screening should be resumed. Some women have medical problems that increase the chance of getting cervical cancer. In these cases, your health care provider may recommend more frequent screening and Pap tests.  The HPV test is an additional test that may be used for cervical cancer screening. The HPV test looks for the virus that can cause the cell changes on the cervix. The cells collected during the Pap test can be tested for HPV. The HPV test could be used to screen women aged 4 years and older, and should be used in women of any age who have unclear Pap test results. After the age of 61, women should have HPV testing at the same frequency as a Pap test.  Colorectal cancer can be detected and often prevented. Most routine colorectal cancer screening begins at the age of 17 years and continues through age 85 years. However, your health care provider may recommend screening at an earlier age if you have risk factors for colon cancer. On a yearly basis, your health care provider may  provide home test kits to check for hidden blood in the stool. Use of a small camera at the end of a tube, to directly examine the colon (sigmoidoscopy or colonoscopy), can detect the earliest forms of colorectal cancer. Talk to your health care provider about this at age 57, when routine screening begins. Direct exam of the colon should be repeated every 5-10 years through age 80 years, unless early forms of pre-cancerous polyps or small growths are found.  People who are at an increased risk for hepatitis B should be screened for this virus. You are considered at high risk for hepatitis B if:  You were born in a country where hepatitis B occurs often. Talk with your health care provider about which countries are considered high risk.  Your parents were born in a high-risk country and you have not received a shot to protect against hepatitis B (hepatitis B vaccine).  You have HIV or AIDS.  You use needles to inject street drugs.  You live with, or have sex with, someone who has hepatitis B.  You get hemodialysis treatment.  You take certain medicines for conditions like cancer, organ transplantation, and autoimmune conditions.  Hepatitis C blood testing is recommended for all people born from [birth year] through 1965 and any individual with known risks for hepatitis C.  Practice safe sex. Use condoms and avoid high-risk sexual practices to reduce the spread of sexually transmitted infections (STIs). STIs include gonorrhea, chlamydia, syphilis, trichomonas, herpes, HPV, and human immunodeficiency virus (HIV). Herpes, HIV, and HPV are viral illnesses that have no cure. They can result in disability, cancer, and death.  You should be screened for sexually transmitted illnesses (STIs) including gonorrhea and chlamydia if:  You are sexually active and are younger than 24 years.  You are older than 24 years and your health care provider tells you that you are at risk for this type of  infection.  Your sexual activity has changed since you were last screened and you are at an increased risk for chlamydia or gonorrhea. Ask your health care provider if you are at risk.  If you are at risk of being infected with HIV, it is recommended that you take a prescription medicine daily to prevent HIV infection. This is called preexposure prophylaxis (PrEP). You are considered  at risk if:  You are a heterosexual woman, are sexually active, and are at increased risk for HIV infection.  You take drugs by injection.  You are sexually active with a partner who has HIV.  Talk with your health care provider about whether you are at high risk of being infected with HIV. If you choose to begin PrEP, you should first be tested for HIV. You should then be tested every 3 months for as long as you are taking PrEP.  Osteoporosis is a disease in which the bones lose minerals and strength with aging. This can result in serious bone fractures or breaks. The risk of osteoporosis can be identified using a bone density scan. Women ages 16 years and over and women at risk for fractures or osteoporosis should discuss screening with their health care providers. Ask your health care provider whether you should take a calcium supplement or vitamin D to reduce the rate of osteoporosis.  Menopause can be associated with physical symptoms and risks. Hormone replacement therapy is available to decrease symptoms and risks. You should talk to your health care provider about whether hormone replacement therapy is right for you.  Use sunscreen. Apply sunscreen liberally and repeatedly throughout the day. You should seek shade when your shadow is shorter than you. Protect yourself by wearing long sleeves, pants, a wide-brimmed hat, and sunglasses year round, whenever you are outdoors.  Once a month, do a whole body skin exam, using a mirror to look at the skin on your back. Tell your health care provider of new moles,  moles that have irregular borders, moles that are larger than a pencil eraser, or moles that have changed in shape or color.  Stay current with required vaccines (immunizations).  Influenza vaccine. All adults should be immunized every year.  Tetanus, diphtheria, and acellular pertussis (Td, Tdap) vaccine. Pregnant women should receive 1 dose of Tdap vaccine during each pregnancy. The dose should be obtained regardless of the length of time since the last dose. Immunization is preferred during the 27th-36th week of gestation. An adult who has not previously received Tdap or who does not know her vaccine status should receive 1 dose of Tdap. This initial dose should be followed by tetanus and diphtheria toxoids (Td) booster doses every 10 years. Adults with an unknown or incomplete history of completing a 3-dose immunization series with Td-containing vaccines should begin or complete a primary immunization series including a Tdap dose. Adults should receive a Td booster every 10 years.  Varicella vaccine. An adult without evidence of immunity to varicella should receive 2 doses or a second dose if she has previously received 1 dose. Pregnant females who do not have evidence of immunity should receive the first dose after pregnancy. This first dose should be obtained before leaving the health care facility. The second dose should be obtained 4-8 weeks after the first dose.  Human papillomavirus (HPV) vaccine. Females aged 13-26 years who have not received the vaccine previously should obtain the 3-dose series. The vaccine is not recommended for use in pregnant females. However, pregnancy testing is not needed before receiving a dose. If a female is found to be pregnant after receiving a dose, no treatment is needed. In that case, the remaining doses should be delayed until after the pregnancy. Immunization is recommended for any person with an immunocompromised condition through the age of 95 years if she  did not get any or all doses earlier. During the 3-dose series, the second  dose should be obtained 4-8 weeks after the first dose. The third dose should be obtained 24 weeks after the first dose and 16 weeks after the second dose.  Zoster vaccine. One dose is recommended for adults aged 86 years or older unless certain conditions are present.  Measles, mumps, and rubella (MMR) vaccine. Adults born before [birth year] generally are considered immune to measles and mumps. Adults born in [birth year] or later should have 1 or more doses of MMR vaccine unless there is a contraindication to the vaccine or there is laboratory evidence of immunity to each of the three diseases. A routine second dose of MMR vaccine should be obtained at least 28 days after the first dose for students attending postsecondary schools, health care workers, or international travelers. People who received inactivated measles vaccine or an unknown type of measles vaccine during 1963-1967 should receive 2 doses of MMR vaccine. People who received inactivated mumps vaccine or an unknown type of mumps vaccine before 1979 and are at high risk for mumps infection should consider immunization with 2 doses of MMR vaccine. For females of childbearing age, rubella immunity should be determined. If there is no evidence of immunity, females who are not pregnant should be vaccinated. If there is no evidence of immunity, females who are pregnant should delay immunization until after pregnancy. Unvaccinated health care workers born before [birth year] who lack laboratory evidence of measles, mumps, or rubella immunity or laboratory confirmation of disease should consider measles and mumps immunization with 2 doses of MMR vaccine or rubella immunization with 1 dose of MMR vaccine.  Pneumococcal 13-valent conjugate (PCV13) vaccine. When indicated, a person who is uncertain of her immunization history and has no record of immunization should receive the PCV13 vaccine. An adult  aged 74 years or older who has certain medical conditions and has not been previously immunized should receive 1 dose of PCV13 vaccine. This PCV13 should be followed with a dose of pneumococcal polysaccharide (PPSV23) vaccine. The PPSV23 vaccine dose should be obtained at least 8 weeks after the dose of PCV13 vaccine. An adult aged 53 years or older who has certain medical conditions and previously received 1 or more doses of PPSV23 vaccine should receive 1 dose of PCV13. The PCV13 vaccine dose should be obtained 1 or more years after the last PPSV23 vaccine dose.  Pneumococcal polysaccharide (PPSV23) vaccine. When PCV13 is also indicated, PCV13 should be obtained first. All adults aged 25 years and older should be immunized. An adult younger than age 79 years who has certain medical conditions should be immunized. Any person who resides in a nursing home or long-term care facility should be immunized. An adult smoker should be immunized. People with an immunocompromised condition and certain other conditions should receive both PCV13 and PPSV23 vaccines. People with human immunodeficiency virus (HIV) infection should be immunized as soon as possible after diagnosis. Immunization during chemotherapy or radiation therapy should be avoided. Routine use of PPSV23 vaccine is not recommended for American Indians, [place] Natives, or people younger than 65 years unless there are medical conditions that require PPSV23 vaccine. When indicated, people who have unknown immunization and have no record of immunization should receive PPSV23 vaccine. One-time revaccination 5 years after the first dose of PPSV23 is recommended for people aged 19-64 years who have chronic kidney failure, nephrotic syndrome, asplenia, or immunocompromised conditions. People who received 1-2 doses of PPSV23 before age 61 years should receive another dose of PPSV23 vaccine at age 31 years or later if  at least 5 years have passed since the previous  dose. Doses of PPSV23 are not needed for people immunized with PPSV23 at or after age 46 years.  Meningococcal vaccine. Adults with asplenia or persistent complement component deficiencies should receive 2 doses of quadrivalent meningococcal conjugate (MenACWY-D) vaccine. The doses should be obtained at least 2 months apart. Microbiologists working with certain meningococcal bacteria, [place] recruits, people at risk during an outbreak, and people who travel to or live in countries with a high rate of meningitis should be immunized. A first-year college student up through age 76 years who is living in a residence hall should receive a dose if she did not receive a dose on or after her 16th birthday. Adults who have certain high-risk conditions should receive one or more doses of vaccine.  Hepatitis A vaccine. Adults who wish to be protected from this disease, have certain high-risk conditions, work with hepatitis A-infected animals, work in hepatitis A research labs, or travel to or work in countries with a high rate of hepatitis A should be immunized. Adults who were previously unvaccinated and who anticipate close contact with an international adoptee during the first 60 days after arrival in the [place] from a country with a high rate of hepatitis A should be immunized.  Hepatitis B vaccine. Adults who wish to be protected from this disease, have certain high-risk conditions, may be exposed to blood or other infectious body fluids, are household contacts or sex partners of hepatitis B positive people, are clients or workers in certain care facilities, or travel to or work in countries with a high rate of hepatitis B should be immunized.  Haemophilus influenzae type b (Hib) vaccine. A previously unvaccinated person with asplenia or sickle cell disease or having a scheduled splenectomy should receive 1 dose of Hib vaccine. Regardless of previous immunization, a recipient of a hematopoietic stem cell  transplant should receive a 3-dose series 6-12 months after her successful transplant. Hib vaccine is not recommended for adults with HIV infection. Preventive Services / Frequency Ages 67 to 70 years  Blood pressure check.** / Every 1 to 2 years.  Lipid and cholesterol check.** / Every 5 years beginning at age 80.  Clinical breast exam.** / Every 3 years for women in their 66s and 71s.  BRCA-related cancer risk assessment.** / For women who have family members with a BRCA-related cancer (breast, ovarian, tubal, or peritoneal cancers).  Pap test.** / Every 2 years from ages 77 through 25. Every 3 years starting at age 58 through age 46 or 74 with a history of 3 consecutive normal Pap tests.  HPV screening.** / Every 3 years from ages 67 through ages 9 to 54 with a history of 3 consecutive normal Pap tests.  Hepatitis C blood test.** / For any individual with known risks for hepatitis C.  Skin self-exam. / Monthly.  Influenza vaccine. / Every year.  Tetanus, diphtheria, and acellular pertussis (Tdap, Td) vaccine.** / Consult your health care provider. Pregnant women should receive 1 dose of Tdap vaccine during each pregnancy. 1 dose of Td every 10 years.  Varicella vaccine.** / Consult your health care provider. Pregnant females who do not have evidence of immunity should receive the first dose after pregnancy.  HPV vaccine. / 3 doses over 6 months, if 57 and younger. The vaccine is not recommended for use in pregnant females. However, pregnancy testing is not needed before receiving a dose.  Measles, mumps, rubella (MMR) vaccine.** / You need  at least 1 dose of MMR if you were born in 1957 or later. You may also need a 2nd dose. For females of childbearing age, rubella immunity should be determined. If there is no evidence of immunity, females who are not pregnant should be vaccinated. If there is no evidence of immunity, females who are pregnant should delay immunization until after  pregnancy.  Pneumococcal 13-valent conjugate (PCV13) vaccine.** / Consult your health care provider.  Pneumococcal polysaccharide (PPSV23) vaccine.** / 1 to 2 doses if you smoke cigarettes or if you have certain conditions.  Meningococcal vaccine.** / 1 dose if you are age 27 to 78 years and a Market researcher living in a residence hall, or have one of several medical conditions, you need to get vaccinated against meningococcal disease. You may also need additional booster doses.  Hepatitis A vaccine.** / Consult your health care provider.  Hepatitis B vaccine.** / Consult your health care provider.  Haemophilus influenzae type b (Hib) vaccine.** / Consult your health care provider. Ages 86 to 75 years  Blood pressure check.** / Every 1 to 2 years.  Lipid and cholesterol check.** / Every 5 years beginning at age 69 years.  Lung cancer screening. / Every year if you are aged 28-80 years and have a 30-pack-year history of smoking and currently smoke or have quit within the past 15 years. Yearly screening is stopped once you have quit smoking for at least 15 years or develop a health problem that would prevent you from having lung cancer treatment.  Clinical breast exam.** / Every year after age 45 years.  BRCA-related cancer risk assessment.** / For women who have family members with a BRCA-related cancer (breast, ovarian, tubal, or peritoneal cancers).  Mammogram.** / Every year beginning at age 71 years and continuing for as long as you are in good health. Consult with your health care provider.  Pap test.** / Every 3 years starting at age 67 years through age 4 or 11 years with a history of 3 consecutive normal Pap tests.  HPV screening.** / Every 3 years from ages 8 years through ages 8 to 47 years with a history of 3 consecutive normal Pap tests.  Fecal occult blood test (FOBT) of stool. / Every year beginning at age 26 years and continuing until age 72 years. You may  not need to do this test if you get a colonoscopy every 10 years.  Flexible sigmoidoscopy or colonoscopy.** / Every 5 years for a flexible sigmoidoscopy or every 10 years for a colonoscopy beginning at age 38 years and continuing until age 12 years.  Hepatitis C blood test.** / For all people born from [birth year] through 1965 and any individual with known risks for hepatitis C.  Skin self-exam. / Monthly.  Influenza vaccine. / Every year.  Tetanus, diphtheria, and acellular pertussis (Tdap/Td) vaccine.** / Consult your health care provider. Pregnant women should receive 1 dose of Tdap vaccine during each pregnancy. 1 dose of Td every 10 years.  Varicella vaccine.** / Consult your health care provider. Pregnant females who do not have evidence of immunity should receive the first dose after pregnancy.  Zoster vaccine.** / 1 dose for adults aged 80 years or older.  Measles, mumps, rubella (MMR) vaccine.** / You need at least 1 dose of MMR if you were born in 1957 or later. You may also need a 2nd dose. For females of childbearing age, rubella immunity should be determined. If there is no evidence of immunity, females who  are not pregnant should be vaccinated. If there is no evidence of immunity, females who are pregnant should delay immunization until after pregnancy.  Pneumococcal 13-valent conjugate (PCV13) vaccine.** / Consult your health care provider.  Pneumococcal polysaccharide (PPSV23) vaccine.** / 1 to 2 doses if you smoke cigarettes or if you have certain conditions.  Meningococcal vaccine.** / Consult your health care provider.  Hepatitis A vaccine.** / Consult your health care provider.  Hepatitis B vaccine.** / Consult your health care provider.  Haemophilus influenzae type b (Hib) vaccine.** / Consult your health care provider. Ages 11 years and over  Blood pressure check.** / Every 1 to 2 years.  Lipid and cholesterol check.** / Every 5 years beginning at age 36 years.  Lung  cancer screening. / Every year if you are aged 73-80 years and have a 30-pack-year history of smoking and currently smoke or have quit within the past 15 years. Yearly screening is stopped once you have quit smoking for at least 15 years or develop a health problem that would prevent you from having lung cancer treatment.  Clinical breast exam.** / Every year after age 29 years.  BRCA-related cancer risk assessment.** / For women who have family members with a BRCA-related cancer (breast, ovarian, tubal, or peritoneal cancers).  Mammogram.** / Every year beginning at age 18 years and continuing for as long as you are in good health. Consult with your health care provider.  Pap test.** / Every 3 years starting at age 7 years through age 32 or 58 years with 3 consecutive normal Pap tests. Testing can be stopped between 65 and 70 years with 3 consecutive normal Pap tests and no abnormal Pap or HPV tests in the past 10 years.  HPV screening.** / Every 3 years from ages 33 years through ages 61 or 77 years with a history of 3 consecutive normal Pap tests. Testing can be stopped between 65 and 70 years with 3 consecutive normal Pap tests and no abnormal Pap or HPV tests in the past 10 years.  Fecal occult blood test (FOBT) of stool. / Every year beginning at age 27 years and continuing until age 34 years. You may not need to do this test if you get a colonoscopy every 10 years.  Flexible sigmoidoscopy or colonoscopy.** / Every 5 years for a flexible sigmoidoscopy or every 10 years for a colonoscopy beginning at age 60 years and continuing until age 59 years.  Hepatitis C blood test.** / For all people born from [birth year] through 1965 and any individual with known risks for hepatitis C.  Osteoporosis screening.** / A one-time screening for women ages 53 years and over and women at risk for fractures or osteoporosis.  Skin self-exam. / Monthly.  Influenza vaccine. / Every year.  Tetanus, diphtheria, and  acellular pertussis (Tdap/Td) vaccine.** / 1 dose of Td every 10 years.  Varicella vaccine.** / Consult your health care provider.  Zoster vaccine.** / 1 dose for adults aged 38 years or older.  Pneumococcal 13-valent conjugate (PCV13) vaccine.** / Consult your health care provider.  Pneumococcal polysaccharide (PPSV23) vaccine.** / 1 dose for all adults aged 8 years and older.  Meningococcal vaccine.** / Consult your health care provider.  Hepatitis A vaccine.** / Consult your health care provider.  Hepatitis B vaccine.** / Consult your health care provider.  Haemophilus influenzae type b (Hib) vaccine.** / Consult your health care provider. ** Family history and personal history of risk and conditions may change your health care provider's  recommendations. Document Released: 03/29/2001 Document Revised: 06/17/2013 Document Reviewed: 06/28/2010 [facility] Patient Information 2015 [place], [place]. This information is not intended to replace advice given to you by your health care provider. Make sure you discuss any questions you have with your health care provider.

## 2013-12-03 NOTE — Progress Notes (Signed)
   Subjective:    Patient ID: Kelsey Church, female    DOB: 05/20/1950, 63 y.o.   MRN: [id number]  Hypertension This is a chronic problem. The current episode started more than 1 year ago. The problem has been gradually improving since onset. The problem is controlled. Pertinent negatives include no anxiety, blurred vision, chest pain, headaches, malaise/fatigue, neck pain, orthopnea, palpitations, peripheral edema, PND, shortness of breath or sweats. There are no associated agents to hypertension. Past treatments include angiotensin blockers and diuretics. The current treatment provides moderate improvement. There are no compliance problems.       Review of Systems  Constitutional: Negative.  Negative for fever, chills, malaise/fatigue, diaphoresis, appetite change and fatigue.  HENT: Negative.   Eyes: Negative.  Negative for blurred vision.  Respiratory: Negative.  Negative for cough, choking, chest tightness, shortness of breath and stridor.   Cardiovascular: Negative.  Negative for chest pain, palpitations, orthopnea, leg swelling and PND.  Gastrointestinal: Negative.  Negative for nausea, vomiting, abdominal pain, diarrhea, constipation and blood in stool.  Endocrine: Negative.   Genitourinary: Negative.   Musculoskeletal: Positive for arthralgias (both knees). Negative for back pain, joint swelling, myalgias and neck pain.  Skin: Negative.  Negative for color change and rash.  Allergic/Immunologic: Negative.   Neurological: Negative.  Negative for headaches.  Hematological: Negative.  Negative for adenopathy. Does not bruise/bleed easily.  Psychiatric/Behavioral: Negative.        Objective:   Physical Exam  Vitals reviewed. Constitutional: She is oriented to person, place, and time. She appears well-developed and well-nourished. No distress.  HENT:  Head: Normocephalic and atraumatic.  Mouth/Throat: Oropharynx is clear and moist. No oropharyngeal exudate.  Eyes:  Conjunctivae are normal. Right eye exhibits no discharge. Left eye exhibits no discharge. No scleral icterus.  Neck: Normal range of motion. Neck supple. No JVD present. No tracheal deviation present. No thyromegaly present.  Cardiovascular: Normal rate, regular rhythm, normal heart sounds and intact distal pulses.  Exam reveals no gallop and no friction rub.   No murmur heard. Pulmonary/Chest: Effort normal and breath sounds normal. No stridor. No respiratory distress. She has no wheezes. She has no rales. She exhibits no tenderness.  Abdominal: Soft. Bowel sounds are normal. She exhibits no distension and no mass. There is no tenderness. There is no rebound and no guarding.  Musculoskeletal: Normal range of motion. She exhibits no edema and no tenderness.  Lymphadenopathy:    She has no cervical adenopathy.  Neurological: She is oriented to person, place, and time.  Skin: Skin is warm and dry. No rash noted. She is not diaphoretic. No erythema. No pallor.     Lab Results  Component Value Date   WBC 14.7* 06/12/2013   HGB 9.5* 06/12/2013   HCT 28.4* 06/12/2013   PLT 187 06/12/2013   GLUCOSE 129* 06/12/2013   CHOL 179 09/06/2012   TRIG 117.0 09/06/2012   HDL 65.20 09/06/2012   LDLCALC 90 09/06/2012   ALT 18 06/06/2013   AST 15 06/06/2013   NA 139 06/12/2013   K 3.1* 06/12/2013   CL 98 06/12/2013   CREATININE 0.74 06/12/2013   BUN 15 06/12/2013   CO2 30 06/12/2013   TSH 0.99 09/06/2012   INR 0.93 06/06/2013       Assessment & Plan:

## 2013-12-03 NOTE — Patient Instructions (Addendum)
Good to see you Kelsey Church is your friend.  Continue the exercises if you can 2 times a week.  I know you will be busy with your knee.   See me when you need me.

## 2013-12-04 NOTE — Assessment & Plan Note (Signed)
Her BP is well controlled Her lytes and renal function are stable 

## 2013-12-04 NOTE — Assessment & Plan Note (Signed)
Exam done Vaccines were reviewed Labs ordered Pt ed material was given 

## 2013-12-09 ENCOUNTER — Encounter (HOSPITAL_COMMUNITY): Payer: Self-pay

## 2013-12-09 ENCOUNTER — Encounter (HOSPITAL_COMMUNITY)
Admission: RE | Admit: 2013-12-09 | Discharge: 2013-12-09 | Disposition: A | Discharge status: Home / Self Care | Payer: BC Managed Care – PPO | Source: Ambulatory Visit | Attending: Orthopedic Surgery | Admitting: Orthopedic Surgery

## 2013-12-09 ENCOUNTER — Encounter (HOSPITAL_COMMUNITY): Payer: Self-pay | Admitting: Pharmacy Technician

## 2013-12-09 DIAGNOSIS — Z01812 Encounter for preprocedural laboratory examination: Secondary | ICD-10-CM | POA: Diagnosis present

## 2013-12-09 HISTORY — DX: Pain, unspecified: R52

## 2013-12-09 HISTORY — DX: Personal history of other infectious and parasitic diseases: Z86.19

## 2013-12-09 LAB — URINALYSIS, ROUTINE W REFLEX MICROSCOPIC
Bilirubin Urine: NEGATIVE
Glucose, UA: NEGATIVE mg/dL
HGB URINE DIPSTICK: NEGATIVE
Ketones, ur: NEGATIVE mg/dL
Nitrite: NEGATIVE
PROTEIN: NEGATIVE mg/dL
Specific Gravity, Urine: 1.019 (ref 1.005–1.030)
Urobilinogen, UA: 0.2 mg/dL (ref 0.0–1.0)
pH: 6 (ref 5.0–8.0)

## 2013-12-09 LAB — APTT: aPTT: 26 seconds (ref 24–37)

## 2013-12-09 LAB — URINE MICROSCOPIC-ADD ON

## 2013-12-09 LAB — PROTIME-INR
INR: 1.03 (ref 0.00–1.49)
Prothrombin Time: 13.6 seconds (ref 11.6–15.2)

## 2013-12-09 LAB — SURGICAL PCR SCREEN
MRSA, PCR: NEGATIVE
Staphylococcus aureus: NEGATIVE

## 2013-12-09 NOTE — Patient Instructions (Addendum)
YOUR SURGERY IS SCHEDULED AT [facility]  ON:  Monday  11/2  REPORT TO  SHORT STAY CENTER AT:  6:00 AM   DO NOT EAT OR DRINK ANYTHING AFTER MIDNIGHT THE NIGHT BEFORE YOUR SURGERY.  YOU MAY BRUSH YOUR TEETH, RINSE OUT YOUR MOUTH--BUT NO WATER, NO FOOD, NO CHEWING GUM, NO MINTS, NO CANDIES, NO CHEWING TOBACCO.  PLEASE TAKE THE FOLLOWING MEDICATIONS THE AM OF YOUR SURGERY WITH A FEW SIPS OF WATER:   TRAMADOL IF NEEDED FOR PAIN   IF YOU HAVE SLEEP APNEA AND USE CPAP OR BIPAP--PLEASE BRING THE MASK AND THE TUBING.  DO NOT BRING YOUR MACHINE.  DO NOT BRING VALUABLES, MONEY, CREDIT CARDS.  DO NOT WEAR JEWELRY, MAKE-UP, NAIL POLISH AND NO METAL PINS OR CLIPS IN YOUR HAIR. CONTACT LENS, DENTURES / PARTIALS, GLASSES SHOULD NOT BE WORN TO SURGERY AND IN MOST CASES-HEARING AIDS WILL NEED TO BE REMOVED.  BRING YOUR GLASSES CASE, ANY EQUIPMENT NEEDED FOR YOUR CONTACT LENS. FOR PATIENTS ADMITTED TO THE HOSPITAL--CHECK OUT TIME THE DAY OF DISCHARGE IS 11:00 AM.  ALL INPATIENT ROOMS ARE PRIVATE - WITH BATHROOM, TELEPHONE, TELEVISION AND WIFI INTERNET.   PLEASE BE AWARE THAT YOU MAY NEED ADDITIONAL BLOOD DRAWN DAY OF YOUR SURGERY  _______________________________________________________________________   [facility] - Preparing for Surgery Before surgery, you can play an important role.  Because skin is not sterile, your skin needs to be as free of germs as possible.  You can reduce the number of germs on your skin by washing with CHG (chlorahexidine gluconate) soap before surgery.  CHG is an antiseptic cleaner which kills germs and bonds with the skin to continue killing germs even after washing. Please DO NOT use if you have an allergy to CHG or antibacterial soaps.  If your skin becomes reddened/irritated stop using the CHG and inform your nurse when you arrive at Short Stay. Do not shave (including legs and underarms) for at least 48 hours prior to the first CHG shower.  You may shave  your face/neck. Please follow these instructions carefully:  1.  Shower with CHG Soap the night before surgery and the  morning of Surgery.  2.  If you choose to wash your hair, wash your hair first as usual with your  normal  shampoo.  3.  After you shampoo, rinse your hair and body thoroughly to remove the  shampoo.                           4.  Use CHG as you would any other liquid soap.  You can apply chg directly  to the skin and wash                       Gently with a scrungie or clean washcloth.  5.  Apply the CHG Soap to your body ONLY FROM THE NECK DOWN.   Do not use on face/ open                           Wound or open sores. Avoid contact with eyes, ears mouth and genitals (private parts).                       Wash face,  Genitals (private parts) with your normal soap.             6.  Wash thoroughly, paying special attention to the area where your surgery  will be performed.  7.  Thoroughly rinse your body with warm water from the neck down.  8.  DO NOT shower/wash with your normal soap after using and rinsing off  the CHG Soap.                9.  Pat yourself dry with a clean towel.            10.  Wear clean pajamas.            11.  Place clean sheets on your bed the night of your first shower and do not  sleep with pets. Day of Surgery : Do not apply any lotions/deodorants the morning of surgery.  Please wear clean clothes to the hospital/surgery center.  FAILURE TO FOLLOW THESE INSTRUCTIONS MAY RESULT IN THE CANCELLATION OF YOUR SURGERY PATIENT SIGNATURE_________________________________  NURSE SIGNATURE__________________________________  ________________________________________________________________________   Kelsey Church  An incentive spirometer is a tool that can help keep your lungs clear and active. This tool measures how well you are filling your lungs with each breath. Taking long deep breaths may help reverse or decrease the chance of developing  breathing (pulmonary) problems (especially infection) following:  A long period of time when you are unable to move or be active. BEFORE THE PROCEDURE   If the spirometer includes an indicator to show your best effort, your nurse or respiratory therapist will set it to a desired goal.  If possible, sit up straight or lean slightly forward. Try not to slouch.  Hold the incentive spirometer in an upright position. INSTRUCTIONS FOR USE  1. Sit on the edge of your bed if possible, or sit up as far as you can in bed or on a chair. 2. Hold the incentive spirometer in an upright position. 3. Breathe out normally. 4. Place the mouthpiece in your mouth and seal your lips tightly around it. 5. Breathe in slowly and as deeply as possible, raising the piston or the ball toward the top of the column. 6. Hold your breath for 3-5 seconds or for as long as possible. Allow the piston or ball to fall to the bottom of the column. 7. Remove the mouthpiece from your mouth and breathe out normally. 8. Rest for a few seconds and repeat Steps 1 through 7 at least 10 times every 1-2 hours when you are awake. Take your time and take a few normal breaths between deep breaths. 9. The spirometer may include an indicator to show your best effort. Use the indicator as a goal to work toward during each repetition. 10. After each set of 10 deep breaths, practice coughing to be sure your lungs are clear. If you have an incision (the cut made at the time of surgery), support your incision when coughing by placing a pillow or rolled up towels firmly against it. Once you are able to get out of bed, walk around indoors and cough well. You may stop using the incentive spirometer when instructed by your caregiver.  RISKS AND COMPLICATIONS  Take your time so you do not get dizzy or light-headed.  If you are in pain, you may need to take or ask for pain medication before doing incentive spirometry. It is harder to take a deep  breath if you are having pain. AFTER USE  Rest and breathe slowly and easily.  It can be helpful to keep track of a log of your progress.  Your caregiver can provide you with a simple table to help with this. If you are using the spirometer at home, follow these instructions: [place] IF:   You are having difficultly using the spirometer.  You have trouble using the spirometer as often as instructed.  Your pain medication is not giving enough relief while using the spirometer.  You develop fever of 100.5 F (38.1 C) or higher. SEEK IMMEDIATE MEDICAL CARE IF:   You cough up bloody sputum that had not been present before.  You develop fever of 102 F (38.9 C) or greater.  You develop worsening pain at or near the incision site. MAKE SURE YOU:   Understand these instructions.  Will watch your condition.  Will get help right away if you are not doing well or get worse. Document Released: 06/13/2006 Document Revised: 04/25/2011 Document Reviewed: 08/14/2006 [employer] Patient Information 2014 [place], [place].   ________________________________________________________________________  WHAT IS A BLOOD TRANSFUSION? Blood Transfusion Information  A transfusion is the replacement of blood or some of its parts. Blood is made up of multiple cells which provide different functions.  Red blood cells carry oxygen and are used for blood loss replacement.  White blood cells fight against infection.  Platelets control bleeding.  Plasma helps clot blood.  Other blood products are available for specialized needs, such as hemophilia or other clotting disorders. BEFORE THE TRANSFUSION  Who gives blood for transfusions?   Healthy volunteers who are fully evaluated to make sure their blood is safe. This is blood bank blood. Transfusion therapy is the safest it has ever been in the practice of medicine. Before blood is taken from a donor, a complete history is taken to make sure  that person has no history of diseases nor engages in risky social behavior (examples are intravenous drug use or sexual activity with multiple partners). The donor's travel history is screened to minimize risk of transmitting infections, such as malaria. The donated blood is tested for signs of infectious diseases, such as HIV and hepatitis. The blood is then tested to be sure it is compatible with you in order to minimize the chance of a transfusion reaction. If you or a relative donates blood, this is often done in anticipation of surgery and is not appropriate for emergency situations. It takes many days to process the donated blood. RISKS AND COMPLICATIONS Although transfusion therapy is very safe and saves many lives, the main dangers of transfusion include:   Getting an infectious disease.  Developing a transfusion reaction. This is an allergic reaction to something in the blood you were given. Every precaution is taken to prevent this. The decision to have a blood transfusion has been considered carefully by your caregiver before blood is given. Blood is not given unless the benefits outweigh the risks. AFTER THE TRANSFUSION  Right after receiving a blood transfusion, you will usually feel much better and more energetic. This is especially true if your red blood cells have gotten low (anemic). The transfusion raises the level of the red blood cells which carry oxygen, and this usually causes an energy increase.  The nurse administering the transfusion will monitor you carefully for complications. HOME CARE INSTRUCTIONS  No special instructions are needed after a transfusion. You may find your energy is better. Speak with your caregiver about any limitations on activity for underlying diseases you may have. SEEK MEDICAL CARE IF:   Your condition is not improving after your transfusion.  You develop redness or irritation at  the intravenous (IV) site. SEEK IMMEDIATE MEDICAL CARE IF:  Any of  the following symptoms occur over the next 12 hours:  Shaking chills.  You have a temperature by mouth above 102 F (38.9 C), not controlled by medicine.  Chest, back, or muscle pain.  People around you feel you are not acting correctly or are confused.  Shortness of breath or difficulty breathing.  Dizziness and fainting.  You get a rash or develop hives.  You have a decrease in urine output.  Your urine turns a dark color or changes to pink, red, or brown. Any of the following symptoms occur over the next 10 days:  You have a temperature by mouth above 102 F (38.9 C), not controlled by medicine.  Shortness of breath.  Weakness after normal activity.  The white part of the eye turns yellow (jaundice).  You have a decrease in the amount of urine or are urinating less often.  Your urine turns a dark color or changes to pink, red, or brown. Document Released: 01/29/2000 Document Revised: 04/25/2011 Document Reviewed: 09/17/2007 [facility] Patient Information 2014 [place], [place].  _______________________________________________________________________

## 2013-12-09 NOTE — Pre-Procedure Instructions (Signed)
PT HAS CXR AND EKG REPORTS IN EPIC FROM 06-06-13. PT HAS CBC, DIFF, AND CMET REPORTS IN EPIC FROM 12/03/13 AND MEDICAL CLEARANCE FROM DR. THOMAS JONES ON CHART. PT'S PREOP URINALYSIS AND URINE MICROSCOPIC REPORTS ABNORMAL - ALL PREOP LABS FAXED TO DR. ALUISIO'S OFFICE FOR REVIEW.

## 2013-12-12 ENCOUNTER — Other Ambulatory Visit: Payer: Self-pay | Admitting: Internal Medicine

## 2013-12-15 ENCOUNTER — Ambulatory Visit: Payer: Self-pay | Admitting: Orthopedic Surgery

## 2013-12-15 NOTE — H&P (Signed)
Kelsey Church DOB: 11-Nov-1950 Widowed / Language: English / Race: White Female Date of Admission:  12/16/2013 Chief Complaint:  Left knee pain History of Present Illness The patient is a 63 year old female who comes in  for a preoperative History and Physical. The patient is scheduled for a left total knee arthroplasty to be performed by Dr. [name]. Aluisio, MD at [facility] on 12/16/2013. The patient is a 63 year old female who presents with knee complaints. The patient was seen for a second opinion. The patient reportsed left knee and right knee (worse) symptoms including: pain, swelling, locking and giving way which began year(s) ago without any known injury. She has had treatment for the right knee the 90's. She was living in [place] at the time and has been living here for about 2 years. She does not recall a specific time when the left knee started bothering her but it has definitely been bothering her more in the past year. She has had only one cortisone injection in that left knee which was last month. She most recently underwent replacement of the right knee and has been recovering well from that procedure. The left knee hurts now. She has valgus deformities of teh left knee. It does feel like it wants to give out at times. She has had a cortisone injection in the past with slight benefit, which did not long last. She has not had viscosupplement injections, as she was told that they probably not be successful given the extent of her arthritis. She is not having any associated hip pain. She occasionally gets some low back pain. She is ready to proceed with the left knee repalcement at this time. They have been treated conservatively in the past for the above stated problem and despite conservative measures, they continue to have progressive pain and severe functional limitations and dysfunction. They have failed non-operative management including home exercise, medications, and  injections. It is felt that they would benefit from undergoing total joint replacement. Risks and benefits of the procedure have been discussed with the patient and they elect to proceed with surgery. There are no active contraindications to surgery such as ongoing infection or rapidly progressive neurological disease.  Allergies No Known Drug Allergies  Problem List/Past Medical  Status post total right knee replacement [id number]) Primary osteoarthritis of left knee (M17.12) Skin Cancer Melanoma Osteoarthritis Sleep Apnea uses CPAP High blood pressure Shingles Menopause  Family History Hypertension father Heart disease in female family member before age 25 Osteoporosis mother Osteoarthritis mother Cancer grandmother mothers side Heart Disease Father. father  Social History  Current work status retired Children 1 Alcohol use never consumed alcohol Exercise Exercises daily; does other Marital status widowed Living situation live alone Illicit drug use no Number of flights of stairs before winded 2-3 Drug/Alcohol Rehab (Previously) no Drug/Alcohol Rehab (Currently) no Pain Contract no Tobacco / smoke exposure no Post-Surgical Plans Home with sister's help. Tobacco use Never smoker. never smoker  Medication History Meloxicam (15MG  Tablet, Oral) Active. Tylenol 8 Hour (650MG  Tablet ER, Oral) Active. Citalopram Hydrobromide (20MG  Tablet, Oral) Active. Prempro (Oral) Specific dose unknown - Active. Losartan Potassium-HCTZ (100-25MG  Tablet, Oral) Active.   Past Surgical History Foot Surgery right Gallbladder Surgery laporoscopic Arthroscopy of Knee right Appendectomy Colon Polyp Removal - Colonoscopy Salpingectomy; Unilateral Date: 2009. Right, with Oophorectomy Oophorectomy; Unilateral Date: 2009. Right, with Salpingectomy   Review of Systems  General Not Present- Chills, Fatigue, Fever, Memory Loss, Night Sweats, Weight Gain and  Weight Loss. Skin Not Present- Eczema, Hives, Itching, Lesions and Rash. HEENT Not Present- Dentures, Double Vision, Headache, Hearing Loss, Tinnitus and Visual Loss. Respiratory Not Present- Allergies, Chronic Cough, Coughing up blood, Shortness of breath at rest and Shortness of breath with exertion. Cardiovascular Not Present- Chest Pain, Difficulty Breathing Lying Down, Murmur, Palpitations, Racing/skipping heartbeats and Swelling. Gastrointestinal Not Present- Abdominal Pain, Bloody Stool, Constipation, Diarrhea, Difficulty Swallowing, Heartburn, Jaundice, Loss of appetitie, Nausea and Vomiting. Female Genitourinary Not Present- Blood in Urine, Discharge, Flank Pain, Incontinence, Painful Urination, Urgency, Urinary frequency, Urinary Retention, Urinating at Night and Weak urinary stream. Musculoskeletal Present- Back Pain, Joint Pain, Joint Swelling and Morning Stiffness. Not Present- Muscle Pain, Muscle Weakness and Spasms. Neurological Not Present- Blackout spells, Difficulty with balance, Dizziness, Paralysis, Tremor and Weakness. Psychiatric Not Present- Insomnia.  Vitals  Weight: 180 lb Height: 66in Weight was reported by patient. Height was reported by patient. Body Surface Area: 1.95 m Body Mass Index: 29.05 kg/m  Pulse: 64 (Regular)  BP: 122/68 (Sitting, Right Arm, Standard)   Physical Exam General Mental Status -Alert, cooperative and good historian. General Appearance-pleasant, Not in acute distress. Orientation-Oriented X3. Build & Nutrition-Well nourished and Well developed.  Head and Neck Head-normocephalic, atraumatic . Neck Global Assessment - supple, no bruit auscultated on the right, no bruit auscultated on the left.  Eye Vision-Wears corrective lenses. Pupil - Bilateral-Regular and Round. Motion - Bilateral-EOMI.  ENMT Note: upper partial and full lower denture plates   Chest and Lung Exam Auscultation Breath sounds -  clear at anterior chest wall and clear at posterior chest wall. Adventitious sounds - No Adventitious sounds.  Cardiovascular Auscultation Rhythm - Regular rate and rhythm. Heart Sounds - S1 WNL and S2 WNL. Murmurs & Other Heart Sounds - Auscultation of the heart reveals - No Murmurs.  Abdomen Palpation/Percussion Tenderness - Abdomen is non-tender to palpation. Rigidity (guarding) - Abdomen is soft. Auscultation Auscultation of the abdomen reveals - Bowel sounds normal.  Female Genitourinary Note: Not done, not pertinent to present illness   Musculoskeletal Note: Well developed female alert and oriented in no apparent distress. Her hips show normal range of motion with no discomfort.  Both knees show no effusion. She does have some tenderness on the lateral joints lines. Range about 5-125 degrees on each side. There is no instability about the lef knee. Pulses, sensation, and motor intact.  RADIOGRAPHS Previous AP and lateral left knee shows severe bone on bone arthritis lateral compartment of both knees. Valgus deformity noted on left. She has patellofemoral arthritis also.   Assessment & Plan Primary osteoarthritis of left knee (M17.12) Status post total right knee replacement [id number]) Note:Plan is for a Left Total Knee Replacement by Dr. Wynelle Link.  Plan is to go home.  PCP - Dr. [name] - Patient has been seen preoperatively and felt to be stable for surgery.  Topical TXA - History of melanoma  Signed electronically by Joelene Millin, III PA-C

## 2013-12-16 ENCOUNTER — Inpatient Hospital Stay (HOSPITAL_COMMUNITY)
Admission: RE | Admit: 2013-12-16 | Discharge: 2013-12-18 | DRG: 470 | Disposition: A | Discharge status: Home Health | Payer: BC Managed Care – PPO | Source: Ambulatory Visit | Attending: Orthopedic Surgery | Admitting: Orthopedic Surgery

## 2013-12-16 ENCOUNTER — Encounter (HOSPITAL_COMMUNITY): Payer: Self-pay | Admitting: *Deleted

## 2013-12-16 ENCOUNTER — Inpatient Hospital Stay (HOSPITAL_COMMUNITY): Payer: BC Managed Care – PPO | Admitting: Certified Registered"

## 2013-12-16 ENCOUNTER — Encounter (HOSPITAL_COMMUNITY)
Admission: RE | Disposition: A | Discharge status: Home Health | Payer: Self-pay | Source: Ambulatory Visit | Attending: Orthopedic Surgery

## 2013-12-16 DIAGNOSIS — Z809 Family history of malignant neoplasm, unspecified: Secondary | ICD-10-CM

## 2013-12-16 DIAGNOSIS — G4730 Sleep apnea, unspecified: Secondary | ICD-10-CM | POA: Diagnosis present

## 2013-12-16 DIAGNOSIS — Z96651 Presence of right artificial knee joint: Secondary | ICD-10-CM | POA: Diagnosis present

## 2013-12-16 DIAGNOSIS — M25762 Osteophyte, left knee: Secondary | ICD-10-CM | POA: Diagnosis present

## 2013-12-16 DIAGNOSIS — I10 Essential (primary) hypertension: Secondary | ICD-10-CM | POA: Diagnosis present

## 2013-12-16 DIAGNOSIS — Z8619 Personal history of other infectious and parasitic diseases: Secondary | ICD-10-CM

## 2013-12-16 DIAGNOSIS — Z8249 Family history of ischemic heart disease and other diseases of the circulatory system: Secondary | ICD-10-CM

## 2013-12-16 DIAGNOSIS — M1712 Unilateral primary osteoarthritis, left knee: Principal | ICD-10-CM | POA: Diagnosis present

## 2013-12-16 DIAGNOSIS — Z79899 Other long term (current) drug therapy: Secondary | ICD-10-CM

## 2013-12-16 DIAGNOSIS — Z780 Asymptomatic menopausal state: Secondary | ICD-10-CM | POA: Diagnosis not present

## 2013-12-16 DIAGNOSIS — M179 Osteoarthritis of knee, unspecified: Secondary | ICD-10-CM | POA: Diagnosis present

## 2013-12-16 DIAGNOSIS — Z01812 Encounter for preprocedural laboratory examination: Secondary | ICD-10-CM

## 2013-12-16 DIAGNOSIS — Z8261 Family history of arthritis: Secondary | ICD-10-CM | POA: Diagnosis not present

## 2013-12-16 DIAGNOSIS — M25562 Pain in left knee: Secondary | ICD-10-CM | POA: Diagnosis present

## 2013-12-16 DIAGNOSIS — F329 Major depressive disorder, single episode, unspecified: Secondary | ICD-10-CM | POA: Diagnosis present

## 2013-12-16 DIAGNOSIS — Z85820 Personal history of malignant melanoma of skin: Secondary | ICD-10-CM | POA: Diagnosis not present

## 2013-12-16 DIAGNOSIS — M1710 Unilateral primary osteoarthritis, unspecified knee: Secondary | ICD-10-CM | POA: Diagnosis present

## 2013-12-16 HISTORY — PX: TOTAL KNEE ARTHROPLASTY: SHX125

## 2013-12-16 LAB — TYPE AND SCREEN
ABO/RH(D): B POS
Antibody Screen: NEGATIVE

## 2013-12-16 SURGERY — ARTHROPLASTY, KNEE, TOTAL
Anesthesia: Monitor Anesthesia Care | Site: Knee | Laterality: Left

## 2013-12-16 MED ORDER — KETOROLAC TROMETHAMINE 15 MG/ML IJ SOLN
7.5000 mg | Freq: Four times a day (QID) | INTRAMUSCULAR | Status: AC | PRN
  Administered 2013-12-16: 7.5 mg via INTRAVENOUS
  Filled 2013-12-16: qty 1

## 2013-12-16 MED ORDER — DIPHENHYDRAMINE HCL 12.5 MG/5ML PO ELIX: 12.5000 mg | ORAL_SOLUTION | ORAL | Status: DC | PRN

## 2013-12-16 MED ORDER — LIDOCAINE HCL (CARDIAC) 20 MG/ML IV SOLN
INTRAVENOUS | Status: AC
  Filled 2013-12-16: qty 5

## 2013-12-16 MED ORDER — SODIUM CHLORIDE 0.9 % IJ SOLN
INTRAMUSCULAR | Status: DC | PRN
  Administered 2013-12-16: 30 mL

## 2013-12-16 MED ORDER — LACTATED RINGERS IV SOLN: INTRAVENOUS | Status: DC

## 2013-12-16 MED ORDER — PROPOFOL 10 MG/ML IV BOLUS
INTRAVENOUS | Status: AC
  Filled 2013-12-16: qty 20

## 2013-12-16 MED ORDER — DEXAMETHASONE SODIUM PHOSPHATE 10 MG/ML IJ SOLN
10.0000 mg | Freq: Once | INTRAMUSCULAR | Status: AC
  Administered 2013-12-16: 10 mg via INTRAVENOUS

## 2013-12-16 MED ORDER — KCL IN DEXTROSE-NACL 20-5-0.45 MEQ/L-%-% IV SOLN
INTRAVENOUS | Status: DC
  Administered 2013-12-16: 14:00:00 via INTRAVENOUS
  Filled 2013-12-16 (×3): qty 1000

## 2013-12-16 MED ORDER — 0.9 % SODIUM CHLORIDE (POUR BTL) OPTIME
TOPICAL | Status: DC | PRN
  Administered 2013-12-16: 1000 mL

## 2013-12-16 MED ORDER — BUPIVACAINE HCL 0.25 % IJ SOLN
INTRAMUSCULAR | Status: DC | PRN
  Administered 2013-12-16: 20 mL

## 2013-12-16 MED ORDER — LIDOCAINE HCL (CARDIAC) 20 MG/ML IV SOLN
INTRAVENOUS | Status: DC | PRN
  Administered 2013-12-16: 50 mg via INTRAVENOUS

## 2013-12-16 MED ORDER — OXYCODONE HCL 5 MG PO TABS: 5.0000 mg | ORAL_TABLET | Freq: Once | ORAL | Status: DC | PRN

## 2013-12-16 MED ORDER — LACTATED RINGERS IV SOLN
INTRAVENOUS | Status: DC | PRN
  Administered 2013-12-16 (×2): via INTRAVENOUS

## 2013-12-16 MED ORDER — HYDROCHLOROTHIAZIDE 25 MG PO TABS
25.0000 mg | ORAL_TABLET | Freq: Every day | ORAL | Status: DC
  Administered 2013-12-18: 25 mg via ORAL
  Filled 2013-12-16 (×3): qty 1

## 2013-12-16 MED ORDER — HYDROMORPHONE HCL 1 MG/ML IJ SOLN: 0.2500 mg | INTRAMUSCULAR | Status: DC | PRN

## 2013-12-16 MED ORDER — METOCLOPRAMIDE HCL 10 MG PO TABS: 5.0000 mg | ORAL_TABLET | Freq: Three times a day (TID) | ORAL | Status: DC | PRN

## 2013-12-16 MED ORDER — OXYCODONE HCL 5 MG PO TABS
5.0000 mg | ORAL_TABLET | ORAL | Status: DC | PRN
  Administered 2013-12-16 – 2013-12-18 (×12): 10 mg via ORAL
  Filled 2013-12-16 (×12): qty 2

## 2013-12-16 MED ORDER — OXYCODONE HCL 5 MG/5ML PO SOLN
5.0000 mg | Freq: Once | ORAL | Status: DC | PRN
  Filled 2013-12-16: qty 5

## 2013-12-16 MED ORDER — METHOCARBAMOL 1000 MG/10ML IJ SOLN
500.0000 mg | Freq: Four times a day (QID) | INTRAVENOUS | Status: DC | PRN
  Administered 2013-12-16: 500 mg via INTRAVENOUS
  Filled 2013-12-16 (×2): qty 5

## 2013-12-16 MED ORDER — ONDANSETRON HCL 4 MG/2ML IJ SOLN
INTRAMUSCULAR | Status: AC
  Filled 2013-12-16: qty 2

## 2013-12-16 MED ORDER — CHLORHEXIDINE GLUCONATE 4 % EX LIQD: 60.0000 mL | Freq: Once | CUTANEOUS | Status: DC

## 2013-12-16 MED ORDER — BUPIVACAINE LIPOSOME 1.3 % IJ SUSP
INTRAMUSCULAR | Status: DC | PRN
  Administered 2013-12-16: 20 mL

## 2013-12-16 MED ORDER — CEFAZOLIN SODIUM-DEXTROSE 2-3 GM-% IV SOLR
2.0000 g | Freq: Four times a day (QID) | INTRAVENOUS | Status: AC
  Administered 2013-12-16 (×2): 2 g via INTRAVENOUS
  Filled 2013-12-16 (×2): qty 50

## 2013-12-16 MED ORDER — METHOCARBAMOL 500 MG PO TABS
500.0000 mg | ORAL_TABLET | Freq: Four times a day (QID) | ORAL | Status: DC | PRN
Start: 2013-12-16 — End: 2013-12-18
  Administered 2013-12-16 – 2013-12-18 (×5): 500 mg via ORAL
  Filled 2013-12-16 (×5): qty 1

## 2013-12-16 MED ORDER — METOCLOPRAMIDE HCL 5 MG/ML IJ SOLN: 5.0000 mg | Freq: Three times a day (TID) | INTRAMUSCULAR | Status: DC | PRN

## 2013-12-16 MED ORDER — SODIUM CHLORIDE 0.9 % IJ SOLN
INTRAMUSCULAR | Status: AC
  Filled 2013-12-16: qty 50

## 2013-12-16 MED ORDER — ONDANSETRON HCL 4 MG/2ML IJ SOLN
INTRAMUSCULAR | Status: DC | PRN
  Administered 2013-12-16: 4 mg via INTRAVENOUS

## 2013-12-16 MED ORDER — ACETAMINOPHEN 500 MG PO TABS
1000.0000 mg | ORAL_TABLET | Freq: Four times a day (QID) | ORAL | Status: AC
  Administered 2013-12-16 – 2013-12-17 (×4): 1000 mg via ORAL
  Filled 2013-12-16 (×2): qty 2

## 2013-12-16 MED ORDER — RIVAROXABAN 10 MG PO TABS
10.0000 mg | ORAL_TABLET | Freq: Every day | ORAL | Status: DC
  Administered 2013-12-17 – 2013-12-18 (×2): 10 mg via ORAL
  Filled 2013-12-16 (×3): qty 1

## 2013-12-16 MED ORDER — DEXAMETHASONE SODIUM PHOSPHATE 10 MG/ML IJ SOLN
INTRAMUSCULAR | Status: AC
  Filled 2013-12-16: qty 1

## 2013-12-16 MED ORDER — ACETAMINOPHEN 10 MG/ML IV SOLN
1000.0000 mg | Freq: Once | INTRAVENOUS | Status: AC
  Administered 2013-12-16: 1000 mg via INTRAVENOUS
  Filled 2013-12-16: qty 100

## 2013-12-16 MED ORDER — CITALOPRAM HYDROBROMIDE 20 MG PO TABS
20.0000 mg | ORAL_TABLET | Freq: Every day | ORAL | Status: DC
  Administered 2013-12-16 – 2013-12-17 (×2): 20 mg via ORAL
  Filled 2013-12-16 (×3): qty 1

## 2013-12-16 MED ORDER — CEFAZOLIN SODIUM-DEXTROSE 2-3 GM-% IV SOLR
2.0000 g | INTRAVENOUS | Status: AC
  Administered 2013-12-16: 2 g via INTRAVENOUS

## 2013-12-16 MED ORDER — SODIUM CHLORIDE 0.9 % IV SOLN: INTRAVENOUS | Status: DC

## 2013-12-16 MED ORDER — PHENYLEPHRINE HCL 10 MG/ML IJ SOLN
INTRAMUSCULAR | Status: DC | PRN
  Administered 2013-12-16: 80 ug via INTRAVENOUS
  Administered 2013-12-16 (×3): 40 ug via INTRAVENOUS

## 2013-12-16 MED ORDER — BISACODYL 10 MG RE SUPP: 10.0000 mg | Freq: Every day | RECTAL | Status: DC | PRN

## 2013-12-16 MED ORDER — POLYETHYLENE GLYCOL 3350 17 G PO PACK: 17.0000 g | PACK | Freq: Every day | ORAL | Status: DC | PRN

## 2013-12-16 MED ORDER — ONDANSETRON HCL 4 MG PO TABS: 4.0000 mg | ORAL_TABLET | Freq: Four times a day (QID) | ORAL | Status: DC | PRN

## 2013-12-16 MED ORDER — MIDAZOLAM HCL 2 MG/2ML IJ SOLN
INTRAMUSCULAR | Status: AC
  Filled 2013-12-16: qty 2

## 2013-12-16 MED ORDER — DOCUSATE SODIUM 100 MG PO CAPS
100.0000 mg | ORAL_CAPSULE | Freq: Two times a day (BID) | ORAL | Status: DC
  Administered 2013-12-16 – 2013-12-18 (×4): 100 mg via ORAL

## 2013-12-16 MED ORDER — LORATADINE 10 MG PO TABS
10.0000 mg | ORAL_TABLET | Freq: Every day | ORAL | Status: DC
  Administered 2013-12-17 – 2013-12-18 (×2): 10 mg via ORAL
  Filled 2013-12-16 (×3): qty 1

## 2013-12-16 MED ORDER — FENTANYL CITRATE 0.05 MG/ML IJ SOLN
INTRAMUSCULAR | Status: AC
  Filled 2013-12-16: qty 2

## 2013-12-16 MED ORDER — ONDANSETRON HCL 4 MG/2ML IJ SOLN: 4.0000 mg | Freq: Four times a day (QID) | INTRAMUSCULAR | Status: DC | PRN

## 2013-12-16 MED ORDER — FENTANYL CITRATE 0.05 MG/ML IJ SOLN
INTRAMUSCULAR | Status: DC | PRN
  Administered 2013-12-16: 100 ug via INTRAVENOUS

## 2013-12-16 MED ORDER — PHENOL 1.4 % MT LIQD
1.0000 | OROMUCOSAL | Status: DC | PRN
Start: 2013-12-16 — End: 2013-12-18

## 2013-12-16 MED ORDER — LOSARTAN POTASSIUM 50 MG PO TABS
100.0000 mg | ORAL_TABLET | Freq: Every day | ORAL | Status: DC
  Administered 2013-12-18: 100 mg via ORAL
  Filled 2013-12-16 (×3): qty 2

## 2013-12-16 MED ORDER — TRANEXAMIC ACID 100 MG/ML IV SOLN
2000.0000 mg | INTRAVENOUS | Status: DC | PRN
  Administered 2013-12-16: 2000 mg via TOPICAL

## 2013-12-16 MED ORDER — CEFAZOLIN SODIUM-DEXTROSE 2-3 GM-% IV SOLR
INTRAVENOUS | Status: AC
  Filled 2013-12-16: qty 50

## 2013-12-16 MED ORDER — SODIUM CHLORIDE 0.9 % IR SOLN
Status: DC | PRN
  Administered 2013-12-16: 1000 mL

## 2013-12-16 MED ORDER — ACETAMINOPHEN 325 MG PO TABS: 650.0000 mg | ORAL_TABLET | Freq: Four times a day (QID) | ORAL | Status: DC | PRN

## 2013-12-16 MED ORDER — BUPIVACAINE LIPOSOME 1.3 % IJ SUSP
20.0000 mL | Freq: Once | INTRAMUSCULAR | Status: DC
  Filled 2013-12-16: qty 20

## 2013-12-16 MED ORDER — DEXAMETHASONE SODIUM PHOSPHATE 10 MG/ML IJ SOLN
10.0000 mg | Freq: Once | INTRAMUSCULAR | Status: AC
  Administered 2013-12-17: 10 mg via INTRAVENOUS
  Filled 2013-12-16: qty 1

## 2013-12-16 MED ORDER — MORPHINE SULFATE 2 MG/ML IJ SOLN: 1.0000 mg | INTRAMUSCULAR | Status: DC | PRN

## 2013-12-16 MED ORDER — ACETAMINOPHEN 650 MG RE SUPP: 650.0000 mg | Freq: Four times a day (QID) | RECTAL | Status: DC | PRN

## 2013-12-16 MED ORDER — LOSARTAN POTASSIUM-HCTZ 100-25 MG PO TABS: 1.0000 | ORAL_TABLET | Freq: Every day | ORAL | Status: DC

## 2013-12-16 MED ORDER — BUPIVACAINE HCL (PF) 0.25 % IJ SOLN
INTRAMUSCULAR | Status: AC
  Filled 2013-12-16: qty 30

## 2013-12-16 MED ORDER — MIDAZOLAM HCL 5 MG/5ML IJ SOLN
INTRAMUSCULAR | Status: DC | PRN
  Administered 2013-12-16: 2 mg via INTRAVENOUS

## 2013-12-16 MED ORDER — MENTHOL 3 MG MT LOZG: 1.0000 | LOZENGE | OROMUCOSAL | Status: DC | PRN

## 2013-12-16 MED ORDER — FLEET ENEMA 7-19 GM/118ML RE ENEM: 1.0000 | ENEMA | Freq: Once | RECTAL | Status: AC | PRN

## 2013-12-16 MED ORDER — TRANEXAMIC ACID 100 MG/ML IV SOLN
2000.0000 mg | Freq: Once | INTRAVENOUS | Status: DC
  Filled 2013-12-16: qty 20

## 2013-12-16 MED ORDER — PROPOFOL INFUSION 10 MG/ML OPTIME
INTRAVENOUS | Status: DC | PRN
  Administered 2013-12-16: 75 ug/kg/min via INTRAVENOUS

## 2013-12-16 SURGICAL SUPPLY — 58 items
BAG ZIPLOCK 12X15 (MISCELLANEOUS) ×2 IMPLANT
BANDAGE ELASTIC 6 VELCRO ST LF (GAUZE/BANDAGES/DRESSINGS) ×2 IMPLANT
BANDAGE ESMARK 6X9 LF (GAUZE/BANDAGES/DRESSINGS) ×1 IMPLANT
BLADE SAG 18X100X1.27 (BLADE) ×2 IMPLANT
BLADE SAW SGTL 11.0X1.19X90.0M (BLADE) ×2 IMPLANT
BNDG ESMARK 6X9 LF (GAUZE/BANDAGES/DRESSINGS) ×2
BOWL SMART MIX CTS (DISPOSABLE) ×2 IMPLANT
CAP KNEE ATTUNE RP ×2 IMPLANT
CEMENT HV SMART SET (Cement) ×4 IMPLANT
CUFF TOURN SGL QUICK 34 (TOURNIQUET CUFF) ×1
CUFF TRNQT CYL 34X4X40X1 (TOURNIQUET CUFF) ×1 IMPLANT
DECANTER SPIKE VIAL GLASS SM (MISCELLANEOUS) ×2 IMPLANT
DRAPE EXTREMITY TIBURON (DRAPES) ×2 IMPLANT
DRAPE POUCH INSTRU U-SHP 10X18 (DRAPES) ×2 IMPLANT
DRAPE U-SHAPE 47X51 STRL (DRAPES) ×2 IMPLANT
DRSG ADAPTIC 3X8 NADH LF (GAUZE/BANDAGES/DRESSINGS) ×2 IMPLANT
DRSG PAD ABDOMINAL 8X10 ST (GAUZE/BANDAGES/DRESSINGS) ×2 IMPLANT
DURAPREP 26ML APPLICATOR (WOUND CARE) ×2 IMPLANT
ELECT REM PT RETURN 9FT ADLT (ELECTROSURGICAL) ×2
ELECTRODE REM PT RTRN 9FT ADLT (ELECTROSURGICAL) ×1 IMPLANT
EVACUATOR 1/8 PVC DRAIN (DRAIN) ×2 IMPLANT
FACESHIELD WRAPAROUND (MASK) ×10 IMPLANT
GAUZE SPONGE 4X4 12PLY STRL (GAUZE/BANDAGES/DRESSINGS) ×2 IMPLANT
GLOVE BIO SURGEON STRL SZ7.5 (GLOVE) ×2 IMPLANT
GLOVE BIO SURGEON STRL SZ8 (GLOVE) ×2 IMPLANT
GLOVE BIOGEL PI IND STRL 6.5 (GLOVE) ×1 IMPLANT
GLOVE BIOGEL PI IND STRL 7.0 (GLOVE) ×2 IMPLANT
GLOVE BIOGEL PI IND STRL 8 (GLOVE) ×2 IMPLANT
GLOVE BIOGEL PI INDICATOR 6.5 (GLOVE) ×1
GLOVE BIOGEL PI INDICATOR 7.0 (GLOVE) ×2
GLOVE BIOGEL PI INDICATOR 8 (GLOVE) ×2
GOWN STRL REUS W/TWL LRG LVL3 (GOWN DISPOSABLE) ×2 IMPLANT
GOWN STRL REUS W/TWL XL LVL3 (GOWN DISPOSABLE) ×4 IMPLANT
HANDPIECE INTERPULSE COAX TIP (DISPOSABLE) ×1
IMMOBILIZER KNEE 20 (SOFTGOODS) ×2
IMMOBILIZER KNEE 20 THIGH 36 (SOFTGOODS) ×1 IMPLANT
KIT BASIN OR (CUSTOM PROCEDURE TRAY) ×2 IMPLANT
MANIFOLD NEPTUNE II (INSTRUMENTS) ×2 IMPLANT
NDL SAFETY ECLIPSE 18X1.5 (NEEDLE) ×2 IMPLANT
NEEDLE HYPO 18GX1.5 SHARP (NEEDLE) ×2
NS IRRIG 1000ML POUR BTL (IV SOLUTION) ×2 IMPLANT
PACK TOTAL JOINT (CUSTOM PROCEDURE TRAY) ×2 IMPLANT
PADDING CAST COTTON 6X4 STRL (CAST SUPPLIES) ×6 IMPLANT
POSITIONER SURGICAL ARM (MISCELLANEOUS) ×2 IMPLANT
SET HNDPC FAN SPRY TIP SCT (DISPOSABLE) ×1 IMPLANT
STRIP CLOSURE SKIN 1/2X4 (GAUZE/BANDAGES/DRESSINGS) ×4 IMPLANT
SUCTION FRAZIER 12FR DISP (SUCTIONS) ×2 IMPLANT
SUT MNCRL AB 4-0 PS2 18 (SUTURE) ×2 IMPLANT
SUT VIC AB 2-0 CT1 27 (SUTURE) ×3
SUT VIC AB 2-0 CT1 TAPERPNT 27 (SUTURE) ×3 IMPLANT
SUT VLOC 180 0 24IN GS25 (SUTURE) ×2 IMPLANT
SYR 20CC LL (SYRINGE) ×2 IMPLANT
SYR 50ML LL SCALE MARK (SYRINGE) ×2 IMPLANT
TOWEL OR 17X26 10 PK STRL BLUE (TOWEL DISPOSABLE) ×2 IMPLANT
TOWEL OR NON WOVEN STRL DISP B (DISPOSABLE) ×2 IMPLANT
TRAY FOLEY CATH 14FRSI W/METER (CATHETERS) ×2 IMPLANT
WATER STERILE IRR 1500ML POUR (IV SOLUTION) ×2 IMPLANT
WRAP KNEE MAXI GEL POST OP (GAUZE/BANDAGES/DRESSINGS) ×2 IMPLANT

## 2013-12-16 NOTE — Progress Notes (Signed)
Utilization review completed.  

## 2013-12-16 NOTE — Evaluation (Signed)
Physical Therapy Evaluation Patient Details Name: Kelsey Church MRN: [id number] DOB: June 01, 1950 Today's Date: 12/16/2013   History of Present Illness  LTKA  Clinical Impression  Patient tolerated  Short ambulation. Will benefit from PT to address problems listed in note below.    Follow Up Recommendations Home health PT;Supervision/Assistance - 24 hour    Equipment Recommendations  None recommended by PT    Recommendations for Other Services       Precautions / Restrictions Precautions Precautions: Knee Required Braces or Orthoses: Knee Immobilizer - Left Knee Immobilizer - Left: Discontinue once straight leg raise with < 10 degree lag      Mobility  Bed Mobility Overal bed mobility: Needs Assistance Bed Mobility: Supine to Sit     Supine to sit: Min assist     General bed mobility comments: cues for technique   Transfers Overall transfer level: Needs assistance Equipment used: Rolling walker (2 wheeled) Transfers: Sit to/from Stand Sit to Stand: Min assist;From elevated surface         General transfer comment: cues for hand placement  Ambulation/Gait Ambulation/Gait assistance: Min guard Ambulation Distance (Feet): 10 Feet Assistive device: Rolling walker (2 wheeled) Gait Pattern/deviations: Step-to pattern;Antalgic;Decreased step length - left;Decreased stance time - left     General Gait Details: cues for sequence   Stairs            Wheelchair Mobility    Modified Rankin (Stroke Patients Only)       Balance                                             Pertinent Vitals/Pain Pain Assessment: 0-10 Pain Score: 5  Pain Location: l knee Pain Descriptors / Indicators: Aching Pain Intervention(s): Premedicated before session;Ice applied    Home Living Family/patient expects to be discharged to:: Private residence Living Arrangements: Other relatives Available Help at Discharge: Available 24 hours/day;Family Type  of Home: Apartment Home Access: Level entry     Home Layout: One level Home Equipment: Shower seat - built in;Walker - 2 wheels Additional Comments: sister to stqy with pt.    Prior Function Level of Independence: Independent               Hand Dominance        Extremity/Trunk Assessment               Lower Extremity Assessment: LLE deficits/detail   LLE Deficits / Details: slr with min assist     Communication   Communication: No difficulties  Cognition Arousal/Alertness: Awake/alert Behavior During Therapy: WFL for tasks assessed/performed Overall Cognitive Status: Within Functional Limits for tasks assessed                      General Comments      Exercises        Assessment/Plan    PT Assessment Patient needs continued PT services  PT Diagnosis Difficulty walking;Acute pain   PT Problem List Decreased strength;Decreased range of motion;Decreased activity tolerance;Decreased mobility;Decreased safety awareness;Decreased knowledge of precautions;Decreased knowledge of use of DME;Pain  PT Treatment Interventions DME instruction;Gait training;Functional mobility training;Therapeutic activities;Therapeutic exercise;Patient/family education   PT Goals (Current goals can be found in the Care Plan section) Acute Rehab PT Goals Patient Stated Goal: to  walk without pain PT Goal Formulation: With patient/family Time For Goal Achievement: 12/21/13 Potential  to Achieve Goals: Good    Frequency 7X/week   Barriers to discharge        Co-evaluation               End of Session Equipment Utilized During Treatment: Left knee immobilizer Activity Tolerance: Patient tolerated treatment well Patient left: in chair;with call bell/phone within reach;with family/visitor present Nurse Communication: Mobility status         Time: [id number] PT Time Calculation (min): 20 min   Charges:   PT Evaluation $Initial PT Evaluation Tier I: 1  Procedure PT Treatments $Gait Training: 8-22 mins   PT G Codes:          [name] 12/16/2013, 6:19 PM

## 2013-12-16 NOTE — Interval H&P Note (Signed)
History and Physical Interval Note:  12/16/2013 7:03 AM  Kelsey Church  has presented today for surgery, with the diagnosis of LEFT KNEE OA  The various methods of treatment have been discussed with the patient and family. After consideration of risks, benefits and other options for treatment, the patient has consented to  Procedure(s): LEFT TOTAL KNEE ARTHROPLASTY (Left) as a surgical intervention .  The patient's history has been reviewed, patient examined, no change in status, stable for surgery.  I have reviewed the patient's chart and labs.  Questions were answered to the patient's satisfaction.     [name]

## 2013-12-16 NOTE — Op Note (Signed)
Pre-operative diagnosis- Osteoarthritis  Left knee(s)  Post-operative diagnosis- Osteoarthritis Left knee(s)  Procedure-  Left  Total Knee Arthroplasty (Attune system)  Surgeon- [name]. Olliver Boyadjian, MD  Assistant- Arlee Muslim, PA-C   Anesthesia-  Spinal  EBL-* No blood loss amount entered *   Drains Hemovac  Tourniquet time-  Total Tourniquet Time Documented: Thigh (Left) - 33 minutes Total: Thigh (Left) - 33 minutes     Complications- None  Condition-PACU - hemodynamically stable.   Brief Clinical Note  Kelsey Church is a 63 y.o. year old female with end stage OA of her left knee with progressively worsening pain and dysfunction. She has constant pain, with activity and at rest and significant functional deficits with difficulties even with ADLs. She has had extensive non-op management including analgesics, injections of cortisone and viscosupplements, and home exercise program, but remains in significant pain with significant dysfunction. Radiographs show bone on bone arthritis lateral and patellofemoral. She presents now for left Total Knee Arthroplasty.    Procedure in detail---   The patient is brought into the operating room and positioned supine on the operating table. After successful administration of  Spinal,   a tourniquet is placed high on the  Left thigh(s) and the lower extremity is prepped and draped in the usual sterile fashion. Time out is performed by the operating team and then the  Left lower extremity is wrapped in Esmarch, knee flexed and the tourniquet inflated to 300 mmHg.       A midline incision is made with a ten blade through the subcutaneous tissue to the level of the extensor mechanism. A fresh blade is used to make a medial parapatellar arthrotomy. Soft tissue over the proximal medial tibia is subperiosteally elevated to the joint line with a knife and into the semimembranosus bursa with a Cobb elevator. Soft tissue over the proximal lateral tibia is  elevated with attention being paid to avoiding the patellar tendon on the tibial tubercle. The patella is everted, knee flexed 90 degrees and the ACL and PCL are removed. Findings are bone on bone lateral and patellofemoral with large global osteophytes.        The drill is used to create a starting hole in the distal femur and the canal is thoroughly irrigated with sterile saline to remove the fatty contents. The 5 degree Left  valgus alignment guide is placed into the femoral canal and the distal femoral cutting block is pinned to remove 10 mm off the distal femur. Resection is made with an oscillating saw.      The tibia is subluxed forward and the menisci are removed. The extramedullary alignment guide is placed referencing proximally at the medial aspect of the tibial tubercle and distally along the second metatarsal axis and tibial crest. The block is pinned to remove 35mm off the more deficient lateral  side. Resection is made with an oscillating saw. Size 4is the most appropriate size for the tibia and the proximal tibia is prepared with the modular drill and keel punch for that size.      The femoral sizing guide is placed and size 3 is most appropriate. Rotation is marked off the epicondylar axis and confirmed by creating a rectangular flexion gap at 90 degrees. The size 3 cutting block is pinned in this rotation and the anterior, posterior and chamfer cuts are made with the oscillating saw. The intercondylar block is then placed and that cut is made.      Trial size 4  tibial component, trial size 3 posterior stabilized femur and a 8  mm posterior stabilized rotating platform insert trial is placed. Full extension is achieved with excellent varus/valgus and anterior/posterior balance throughout full range of motion. The patella is everted and thickness measured to be 22  mm. Free hand resection is taken to 12 mm, a 35 template is placed, lug holes are drilled, trial patella is placed, and it tracks  normally. Osteophytes are removed off the posterior femur with the trial in place. All trials are removed and the cut bone surfaces prepared with pulsatile lavage. Cement is mixed and once ready for implantation, the size 4 tibial implant, size  3 posterior stabilized femoral component, and the size 35 patella are cemented in place and the patella is held with the clamp. The trial insert is placed and the knee held in full extension. The Exparel (20 ml mixed with 30 ml saline) and .25% Bupivicaine, are injected into the extensor mechanism, posterior capsule, medial and lateral gutters and subcutaneous tissues.  All extruded cement is removed and once the cement is hard the permanent 8 mm posterior stabilized rotating platform insert is placed into the tibial tray.      The wound is copiously irrigated with saline solution and the extensor mechanism closed over a hemovac drain with #1 V-loc suture. The tourniquet is released for a total tourniquet time of 32  minutes. Flexion against gravity is 140 degrees and the patella tracks normally. Subcutaneous tissue is closed with 2.0 vicryl and subcuticular with running 4.0 Monocryl. The incision is cleaned and dried and steri-strips and a bulky sterile dressing are applied. The limb is placed into a knee immobilizer and the patient is awakened and transported to recovery in stable condition.      Please note that a surgical assistant was a medical necessity for this procedure in order to perform it in a safe and expeditious manner. Surgical assistant was necessary to retract the ligaments and vital neurovascular structures to prevent injury to them and also necessary for proper positioning of the limb to allow for anatomic placement of the prosthesis.   [name] Shourya Macpherson, MD    12/16/2013, 9:13 AM

## 2013-12-16 NOTE — Anesthesia Procedure Notes (Signed)
Spinal Patient location during procedure: OR End time: 12/16/2013 8:14 AM Staffing Resident/CRNA: Noralyn Pick Performed by: anesthesiologist and resident/CRNA  Preanesthetic Checklist Completed: patient identified, site marked, surgical consent, pre-op evaluation, timeout performed, IV checked, risks and benefits discussed and monitors and equipment checked Spinal Block Patient position: sitting Prep: Betadine Patient monitoring: heart rate, continuous pulse ox and blood pressure Approach: midline Location: L3-4 Injection technique: single-shot Needle Needle type: Pencil-Tip and Sprotte  Needle gauge: 22 G Needle length: 9 cm Assessment Sensory level: T6 Additional Notes Expiration date of kit checked and confirmed. Patient tolerated procedure well, without complications.

## 2013-12-16 NOTE — Anesthesia Preprocedure Evaluation (Signed)
Anesthesia Evaluation  Patient identified by MRN, date of birth, ID band Patient awake    Reviewed: Allergy & Precautions, H&P , NPO status , Patient's Chart, lab work & pertinent test results  History of Anesthesia Complications Negative for: history of anesthetic complications  Airway Mallampati: III  TM Distance: >3 FB Neck ROM: Full    Dental  (+) Edentulous Lower,    Pulmonary sleep apnea and Continuous Positive Airway Pressure Ventilation ,  breath sounds clear to auscultation        Cardiovascular hypertension, Rhythm:Regular     Neuro/Psych Depression negative neurological ROS     GI/Hepatic negative GI ROS, Neg liver ROS,   Endo/Other  negative endocrine ROS  Renal/GU negative Renal ROS     Musculoskeletal  (+) Arthritis -, Osteoarthritis,    Abdominal   Peds  Hematology negative hematology ROS (+)   Anesthesia Other Findings   Reproductive/Obstetrics                             Anesthesia Physical Anesthesia Plan  ASA: II  Anesthesia Plan: Spinal and MAC   Post-op Pain Management:    Induction:   Airway Management Planned: Natural Airway and Simple Face Mask  Additional Equipment:   Intra-op Plan:   Post-operative Plan:   Informed Consent: I have reviewed the patients History and Physical, chart, labs and discussed the procedure including the risks, benefits and alternatives for the proposed anesthesia with the patient or authorized representative who has indicated his/her understanding and acceptance.   Dental advisory given  Plan Discussed with: CRNA and Surgeon  Anesthesia Plan Comments:         Anesthesia Quick Evaluation

## 2013-12-16 NOTE — Anesthesia Postprocedure Evaluation (Signed)
  Anesthesia Post-op Note  Patient: Kelsey Church  Procedure(s) Performed: Procedure(s): LEFT TOTAL KNEE ARTHROPLASTY (Left)  Patient Location: PACU  Anesthesia Type:Spinal  Level of Consciousness: awake  Airway and Oxygen Therapy: Patient Spontanous Breathing  Post-op Pain: mild  Post-op Assessment: Post-op Vital signs reviewed, Patient's Cardiovascular Status Stable, Respiratory Function Stable, Patent Airway, No signs of Nausea or vomiting and Pain level controlled  Post-op Vital Signs: Reviewed and stable  Last Vitals:  Filed Vitals:   12/16/13 1417  BP: 98/60  Pulse: 76  Temp: 36.7 C  Resp: 16    Complications: No apparent anesthesia complications

## 2013-12-16 NOTE — H&P (View-Only) (Signed)
Kelsey Church DOB: [birth year] Widowed / Language: English / Race: White Female Date of Admission:  12/16/2013 Chief Complaint:  Left knee pain History of Present Illness The patient is a 63 year old female who comes in  for a preoperative History and Physical. The patient is scheduled for a left total knee arthroplasty to be performed by Dr. [name]. Aluisio, MD at [facility] on 12/16/2013. The patient is a 63 year old female who presents with knee complaints. The patient was seen for a second opinion. The patient reportsed left knee and right knee (worse) symptoms including: pain, swelling, locking and giving way which began year(s) ago without any known injury. She has had treatment for the right knee the 90's. She was living in [place] at the time and has been living here for about 2 years. She does not recall a specific time when the left knee started bothering her but it has definitely been bothering her more in the past year. She has had only one cortisone injection in that left knee which was last month. She most recently underwent replacement of the right knee and has been recovering well from that procedure. The left knee hurts now. She has valgus deformities of teh left knee. It does feel like it wants to give out at times. She has had a cortisone injection in the past with slight benefit, which did not long last. She has not had viscosupplement injections, as she was told that they probably not be successful given the extent of her arthritis. She is not having any associated hip pain. She occasionally gets some low back pain. She is ready to proceed with the left knee repalcement at this time. They have been treated conservatively in the past for the above stated problem and despite conservative measures, they continue to have progressive pain and severe functional limitations and dysfunction. They have failed non-operative management including home exercise, medications, and  injections. It is felt that they would benefit from undergoing total joint replacement. Risks and benefits of the procedure have been discussed with the patient and they elect to proceed with surgery. There are no active contraindications to surgery such as ongoing infection or rapidly progressive neurological disease.  Allergies No Known Drug Allergies  Problem List/Past Medical  Status post total right knee replacement [id number]) Primary osteoarthritis of left knee (M17.12) Skin Cancer Melanoma Osteoarthritis Sleep Apnea uses CPAP High blood pressure Shingles Menopause  Family History Hypertension father Heart disease in female family member before age 95 Osteoporosis mother Osteoarthritis mother Cancer grandmother mothers side Heart Disease Father. father  Social History  Current work status retired Children 1 Alcohol use never consumed alcohol Exercise Exercises daily; does other Marital status widowed Living situation live alone Illicit drug use no Number of flights of stairs before winded 2-3 Drug/Alcohol Rehab (Previously) no Drug/Alcohol Rehab (Currently) no Pain Contract no Tobacco / smoke exposure no Post-Surgical Plans Home with sister's help. Tobacco use Never smoker. never smoker  Medication History Meloxicam (15MG  Tablet, Oral) Active. Tylenol 8 Hour (650MG  Tablet ER, Oral) Active. Citalopram Hydrobromide (20MG  Tablet, Oral) Active. Prempro (Oral) Specific dose unknown - Active. Losartan Potassium-HCTZ (100-25MG  Tablet, Oral) Active.   Past Surgical History Foot Surgery right Gallbladder Surgery laporoscopic Arthroscopy of Knee right Appendectomy Colon Polyp Removal - Colonoscopy Salpingectomy; Unilateral Date: 2009. Right, with Oophorectomy Oophorectomy; Unilateral Date: 2009. Right, with Salpingectomy   Review of Systems  General Not Present- Chills, Fatigue, Fever, Memory Loss, Night Sweats, Weight Gain and  Weight Loss. Skin Not Present- Eczema, Hives, Itching, Lesions and Rash. HEENT Not Present- Dentures, Double Vision, Headache, Hearing Loss, Tinnitus and Visual Loss. Respiratory Not Present- Allergies, Chronic Cough, Coughing up blood, Shortness of breath at rest and Shortness of breath with exertion. Cardiovascular Not Present- Chest Pain, Difficulty Breathing Lying Down, Murmur, Palpitations, Racing/skipping heartbeats and Swelling. Gastrointestinal Not Present- Abdominal Pain, Bloody Stool, Constipation, Diarrhea, Difficulty Swallowing, Heartburn, Jaundice, Loss of appetitie, Nausea and Vomiting. Female Genitourinary Not Present- Blood in Urine, Discharge, Flank Pain, Incontinence, Painful Urination, Urgency, Urinary frequency, Urinary Retention, Urinating at Night and Weak urinary stream. Musculoskeletal Present- Back Pain, Joint Pain, Joint Swelling and Morning Stiffness. Not Present- Muscle Pain, Muscle Weakness and Spasms. Neurological Not Present- Blackout spells, Difficulty with balance, Dizziness, Paralysis, Tremor and Weakness. Psychiatric Not Present- Insomnia.  Vitals  Weight: 180 lb Height: 66in Weight was reported by patient. Height was reported by patient. Body Surface Area: 1.95 m Body Mass Index: 29.05 kg/m  Pulse: 64 (Regular)  BP: 122/68 (Sitting, Right Arm, Standard)   Physical Exam General Mental Status -Alert, cooperative and good historian. General Appearance-pleasant, Not in acute distress. Orientation-Oriented X3. Build & Nutrition-Well nourished and Well developed.  Head and Neck Head-normocephalic, atraumatic . Neck Global Assessment - supple, no bruit auscultated on the right, no bruit auscultated on the left.  Eye Vision-Wears corrective lenses. Pupil - Bilateral-Regular and Round. Motion - Bilateral-EOMI.  ENMT Note: upper partial and full lower denture plates   Chest and Lung Exam Auscultation Breath sounds -  clear at anterior chest wall and clear at posterior chest wall. Adventitious sounds - No Adventitious sounds.  Cardiovascular Auscultation Rhythm - Regular rate and rhythm. Heart Sounds - S1 WNL and S2 WNL. Murmurs & Other Heart Sounds - Auscultation of the heart reveals - No Murmurs.  Abdomen Palpation/Percussion Tenderness - Abdomen is non-tender to palpation. Rigidity (guarding) - Abdomen is soft. Auscultation Auscultation of the abdomen reveals - Bowel sounds normal.  Female Genitourinary Note: Not done, not pertinent to present illness   Musculoskeletal Note: Well developed female alert and oriented in no apparent distress. Her hips show normal range of motion with no discomfort.  Both knees show no effusion. She does have some tenderness on the lateral joints lines. Range about 5-125 degrees on each side. There is no instability about the lef knee. Pulses, sensation, and motor intact.  RADIOGRAPHS Previous AP and lateral left knee shows severe bone on bone arthritis lateral compartment of both knees. Valgus deformity noted on left. She has patellofemoral arthritis also.   Assessment & Plan Primary osteoarthritis of left knee (M17.12) Status post total right knee replacement [id number]) Note:Plan is for a Left Total Knee Replacement by Dr. Wynelle Link.  Plan is to go home.  PCP - Dr. [name] - Patient has been seen preoperatively and felt to be stable for surgery.  Topical TXA - History of melanoma  Signed electronically by Joelene Millin, III PA-C

## 2013-12-16 NOTE — Transfer of Care (Signed)
Immediate Anesthesia Transfer of Care Note  Patient: Kelsey Church  Procedure(s) Performed: Procedure(s): LEFT TOTAL KNEE ARTHROPLASTY (Left)  Patient Location: PACU  Anesthesia Type:Regional  Level of Consciousness: awake, alert  and oriented  Airway & Oxygen Therapy: Patient Spontanous Breathing and Patient connected to face mask oxygen  Post-op Assessment: Report given to PACU RN and Post -op Vital signs reviewed and stable  Post vital signs: Reviewed and stable  Complications: No apparent anesthesia complications

## 2013-12-16 NOTE — Plan of Care (Signed)
Problem: Consults Goal: Diagnosis- Total Joint Replacement Left total knee  Problem: Phase I Progression Outcomes Goal: CMS/Neurovascular status WDL Outcome: Completed/Met Date Met:  12/16/13 Goal: Pain controlled with appropriate interventions Outcome: Progressing Goal: Dangle or out of bed evening of surgery Outcome: Progressing Goal: Initial discharge plan identified Outcome: Adequate for Discharge Goal: Hemodynamically stable Outcome: Completed/Met Date Met:  12/16/13

## 2013-12-17 ENCOUNTER — Encounter (HOSPITAL_COMMUNITY): Payer: Self-pay | Admitting: Orthopedic Surgery

## 2013-12-17 LAB — BASIC METABOLIC PANEL
Anion gap: 9 (ref 5–15)
BUN: 16 mg/dL (ref 6–23)
CO2: 27 mEq/L (ref 19–32)
Calcium: 8.8 mg/dL (ref 8.4–10.5)
Chloride: 104 mEq/L (ref 96–112)
Creatinine, Ser: 0.98 mg/dL (ref 0.50–1.10)
GFR calc non Af Amer: 60 mL/min — ABNORMAL LOW (ref >90)
GFR, EST AFRICAN AMERICAN: 70 mL/min — AB (ref >90)
Glucose, Bld: 148 mg/dL — ABNORMAL HIGH (ref 70–99)
Potassium: 3.9 mEq/L (ref 3.7–5.3)
Sodium: 140 mEq/L (ref 137–147)

## 2013-12-17 LAB — CBC
HCT: 32.2 % — ABNORMAL LOW (ref 36.0–46.0)
Hemoglobin: 10.5 g/dL — ABNORMAL LOW (ref 12.0–15.0)
MCH: 28.4 pg (ref 26.0–34.0)
MCHC: 32.6 g/dL (ref 30.0–36.0)
MCV: 87 fL (ref 78.0–100.0)
PLATELETS: 178 10*3/uL (ref 150–400)
RBC: 3.7 MIL/uL — ABNORMAL LOW (ref 3.87–5.11)
RDW: 14.3 % (ref 11.5–15.5)
WBC: 10 10*3/uL (ref 4.0–10.5)

## 2013-12-17 MED ORDER — RIVAROXABAN 10 MG PO TABS: 10.0000 mg | ORAL_TABLET | Freq: Every day | ORAL | Status: DC

## 2013-12-17 MED ORDER — TRAMADOL HCL 50 MG PO TABS: 50.0000 mg | ORAL_TABLET | Freq: Four times a day (QID) | ORAL | Status: DC | PRN

## 2013-12-17 MED ORDER — OXYCODONE HCL 5 MG PO TABS: 5.0000 mg | ORAL_TABLET | ORAL | Status: DC | PRN

## 2013-12-17 MED ORDER — METHOCARBAMOL 500 MG PO TABS: 500.0000 mg | ORAL_TABLET | Freq: Four times a day (QID) | ORAL | Status: DC | PRN

## 2013-12-17 NOTE — Progress Notes (Signed)
   Subjective: 1 Day Post-Op Procedure(s) (LRB): LEFT TOTAL KNEE ARTHROPLASTY (Left) Patient reports pain as mild and moderate.   Patient seen in rounds with Dr. Wynelle Link.  Doing okay this morning. Patient is well, but has had some minor complaints of pain in the knee, requiring pain medications We will resume therapy today.  Plan is to go Home after hospital stay.  Objective: Vital signs in last 24 hours: Temp:  [97.6 F (36.4 C)-98 F (36.7 C)] 97.8 F (36.6 C) (11/03 0554) Pulse Rate:  [63-76] 66 (11/03 0554) Resp:  [7-16] 16 (11/03 0729) BP: (93-124)/(58-70) 124/70 mmHg (11/03 0554) SpO2:  [95 %-100 %] 98 % (11/03 0554) Weight:  [83.915 kg (185 lb)] 83.915 kg (185 lb) (11/02 1115)  Intake/Output from previous day:  Intake/Output Summary (Last 24 hours) at 12/17/13 0808 Last data filed at 12/17/13 0645  Gross per 24 hour  Intake 5402.5 ml  Output   2995 ml  Net 2407.5 ml    Labs:  Recent Labs  12/17/13 0435  HGB 10.5*    Recent Labs  12/17/13 0435  WBC 10.0  RBC 3.70*  HCT 32.2*  PLT 178    Recent Labs  12/17/13 0435  NA 140  K 3.9  CL 104  CO2 27  BUN 16  CREATININE 0.98  GLUCOSE 148*  CALCIUM 8.8   No results for input(s): LABPT, INR in the last 72 hours.  EXAM General - Patient is Alert, Appropriate and Oriented Extremity - Neurovascular intact Sensation intact distally Dorsiflexion/Plantar flexion intact Dressing - dressing C/D/I Motor Function - intact, moving foot and toes well on exam.  Hemovac pulled without difficulty.  Past Medical History  Diagnosis Date  . Melanoma in situ   . Hypertension   . Sleep apnea 09/2011    CPAP  . Sleep apnea   . Depression   . Complication of anesthesia     "chills every evening at sundown x 2 weeks"- no fever  . 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 History of shingles     NO RESIDUAL PROBLEMS  . Pain     LOWER BACK PAIN    Assessment/Plan: 1  Day Post-Op Procedure(s) (LRB): LEFT TOTAL KNEE ARTHROPLASTY (Left) Principal Problem:   OA (osteoarthritis) of knee  Estimated body mass index is 29.87 kg/(m^2) as calculated from the following:   Height as of this encounter: 5\' 6"  (1.676 m).   Weight as of this encounter: 83.915 kg (185 lb). Advance diet Up with therapy Plan for discharge tomorrow Discharge home with home health  DVT Prophylaxis - Xarelto Weight-Bearing as tolerated to left leg D/C O2 and Pulse OX and try on Room Air  Arlee Muslim, PA-C Orthopaedic Surgery 12/17/2013, 8:08 AM

## 2013-12-17 NOTE — Plan of Care (Signed)
Problem: Phase II Progression Outcomes Goal: Tolerating diet Outcome: Completed/Met Date Met:  12/17/13     

## 2013-12-17 NOTE — Discharge Summary (Signed)
Physician Discharge Summary   Patient ID: MAREESA Church MRN: [id number] DOB/AGE: 1951-01-25 63 y.o.  Admit date: 12/16/2013 Discharge date: 12/18/2013  Primary Diagnosis:  Osteoarthritis Left knee(s) Admission Diagnoses:  Past Medical History  Diagnosis Date  . Melanoma in situ   . Hypertension   . Sleep apnea 09/2011    CPAP  . Sleep apnea   . Depression   . Complication of anesthesia     "chills every evening at sundown x 2 weeks"- no fever  . 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 History of shingles     NO RESIDUAL PROBLEMS  . Pain     LOWER BACK PAIN   Discharge Diagnoses:   Principal Problem:   OA (osteoarthritis) of knee  Estimated body mass index is 29.87 kg/(m^2) as calculated from the following:   Height as of this encounter: _0  (1.676 m).   Weight as of this encounter: 83.915 kg (185 lb).  Procedure:  Procedure(s) (LRB): LEFT TOTAL KNEE ARTHROPLASTY (Left)   Consults: None  HPI: Kelsey Church is a 63 y.o. year old female with end stage OA of her left knee with progressively worsening pain and dysfunction. She has constant pain, with activity and at rest and significant functional deficits with difficulties even with ADLs. She has had extensive non-op management including analgesics, injections of cortisone and viscosupplements, and home exercise program, but remains in significant pain with significant dysfunction. Radiographs show bone on bone arthritis lateral and patellofemoral. She presents now for left Total Knee Arthroplasty.  Laboratory Data: Admission on 12/16/2013, Discharged on 12/18/2013  Component Date Value Ref Range Status  . WBC 12/17/2013 10.0  4.0 - 10.5 K/uL Final  . RBC 12/17/2013 3.70* 3.87 - 5.11 MIL/uL Final  . Hemoglobin 12/17/2013 10.5* 12.0 - 15.0 g/dL Final  . HCT 12/17/2013 32.2* 36.0 - 46.0 % Final  . MCV 12/17/2013 87.0  78.0 - 100.0 fL Final  . MCH 12/17/2013 28.4  26.0 - 34.0  pg Final  . MCHC 12/17/2013 32.6  30.0 - 36.0 g/dL Final  . RDW 12/17/2013 14.3  11.5 - 15.5 % Final  . Platelets 12/17/2013 178  150 - 400 K/uL Final  . Sodium 12/17/2013 140  137 - 147 mEq/L Final  . Potassium 12/17/2013 3.9  3.7 - 5.3 mEq/L Final  . Chloride 12/17/2013 104  96 - 112 mEq/L Final  . CO2 12/17/2013 27  19 - 32 mEq/L Final  . Glucose, Bld 12/17/2013 148* 70 - 99 mg/dL Final  . BUN 12/17/2013 16  6 - 23 mg/dL Final  . Creatinine, Ser 12/17/2013 0.98  0.50 - 1.10 mg/dL Final  . Calcium 12/17/2013 8.8  8.4 - 10.5 mg/dL Final  . GFR calc non Af Amer 12/17/2013 60* >90 mL/min Final  . GFR calc Af Amer 12/17/2013 70* >90 mL/min Final   Comment: (NOTE) The eGFR has been calculated using the CKD EPI equation. This calculation has not been validated in all clinical situations. eGFR's persistently <90 mL/min signify possible Chronic Kidney Disease.   . Anion gap 12/17/2013 9  5 - 15 Final  . WBC 12/18/2013 9.1  4.0 - 10.5 K/uL Final  . RBC 12/18/2013 3.50* 3.87 - 5.11 MIL/uL Final  . Hemoglobin 12/18/2013 10.1* 12.0 - 15.0 g/dL Final  . HCT 12/18/2013 30.6* 36.0 - 46.0 % Final  . MCV 12/18/2013 87.4  78.0 - 100.0 fL Final  . MCH  12/18/2013 28.9  26.0 - 34.0 pg Final  . MCHC 12/18/2013 33.0  30.0 - 36.0 g/dL Final  . RDW 12/18/2013 14.8  11.5 - 15.5 % Final  . Platelets 12/18/2013 188  150 - 400 K/uL Final  . Sodium 12/18/2013 142  137 - 147 mEq/L Final  . Potassium 12/18/2013 3.8  3.7 - 5.3 mEq/L Final  . Chloride 12/18/2013 104  96 - 112 mEq/L Final  . CO2 12/18/2013 27  19 - 32 mEq/L Final  . Glucose, Bld 12/18/2013 124* 70 - 99 mg/dL Final  . BUN 12/18/2013 22  6 - 23 mg/dL Final  . Creatinine, Ser 12/18/2013 0.98  0.50 - 1.10 mg/dL Final  . Calcium 12/18/2013 8.9  8.4 - 10.5 mg/dL Final  . GFR calc non Af Amer 12/18/2013 60* >90 mL/min Final  . GFR calc Af Amer 12/18/2013 70* >90 mL/min Final   Comment: (NOTE) The eGFR has been calculated using the CKD EPI  equation. This calculation has not been validated in all clinical situations. eGFR's persistently <90 mL/min signify possible Chronic Kidney Disease.   Georgiann Hahn gap 12/18/2013 11  5 - 15 Final  Hospital Outpatient Visit on 12/09/2013  Component Date Value Ref Range Status  . MRSA, PCR 12/09/2013 NEGATIVE  NEGATIVE Final  . Staphylococcus aureus 12/09/2013 NEGATIVE  NEGATIVE Final   Comment:                                 The Xpert SA Assay (FDA                          approved for NASAL specimens                          in patients over 80 years of age),                          is one component of                          a comprehensive surveillance                          program.  Test performance has                          been validated by [employer] for patients greater                          than or equal to 80 year old.                          It is not intended                          to diagnose infection nor to                          guide or monitor treatment.  Marland Kitchen aPTT 12/09/2013  26  24 - 37 seconds Final  . Prothrombin Time 12/09/2013 13.6  11.6 - 15.2 seconds Final  . INR 12/09/2013 1.03  0.00 - 1.49 Final  . ABO/RH(D) 12/09/2013 B POS   Final  . Antibody Screen 12/09/2013 NEG   Final  . Sample Expiration 12/09/2013 12/19/2013   Final  . Color, Urine 12/09/2013 YELLOW  YELLOW Final  . APPearance 12/09/2013 TURBID* CLEAR Final  . Specific Gravity, Urine 12/09/2013 1.019  1.005 - 1.030 Final  . pH 12/09/2013 6.0  5.0 - 8.0 Final  . Glucose, UA 12/09/2013 NEGATIVE  NEGATIVE mg/dL Final  . Hgb urine dipstick 12/09/2013 NEGATIVE  NEGATIVE Final  . Bilirubin Urine 12/09/2013 NEGATIVE  NEGATIVE Final  . Ketones, ur 12/09/2013 NEGATIVE  NEGATIVE mg/dL Final  . Protein, ur 12/09/2013 NEGATIVE  NEGATIVE mg/dL Final  . Urobilinogen, UA 12/09/2013 0.2  0.0 - 1.0 mg/dL Final  . Nitrite 12/09/2013 NEGATIVE  NEGATIVE Final  . Leukocytes, UA  12/09/2013 SMALL* NEGATIVE Final  . Squamous Epithelial / LPF 12/09/2013 MANY* RARE Final  . WBC, UA 12/09/2013 3-6  <3 WBC/hpf Final  . Bacteria, UA 12/09/2013 MANY* RARE Final  . Urine-Other 12/09/2013 MUCOUS PRESENT   Final  Appointment on 12/03/2013  Component Date Value Ref Range Status  . Cholesterol 12/03/2013 196  0 - 200 mg/dL Final   ATP III Classification       Desirable:  < 200 mg/dL               Borderline High:  200 - 239 mg/dL          High:  > = 240 mg/dL  . Triglycerides 12/03/2013 116.0  0.0 - 149.0 mg/dL Final   Normal:  <150 mg/dLBorderline High:  150 - 199 mg/dL  . HDL 12/03/2013 62.70  >39.00 mg/dL Final  . VLDL 12/03/2013 23.2  0.0 - 40.0 mg/dL Final  . LDL Cholesterol 12/03/2013 110* 0 - 99 mg/dL Final  . Total CHOL/HDL Ratio 12/03/2013 3   Final                  Men          Women1/2 Average Risk     3.4          3.3Average Risk          5.0          4.42X Average Risk          9.6          7.13X Average Risk          15.0          11.0                      . NonHDL 12/03/2013 133.30   Final   NOTE:  Non-HDL goal should be 30 mg/dL higher than patient's LDL goal (i.e. LDL goal of < 70 mg/dL, would have non-HDL goal of < 100 mg/dL)  . Sodium 12/03/2013 140  135 - 145 mEq/L Final  . Potassium 12/03/2013 3.9  3.5 - 5.1 mEq/L Final  . Chloride 12/03/2013 102  96 - 112 mEq/L Final  . CO2 12/03/2013 23  19 - 32 mEq/L Final  . Glucose, Bld 12/03/2013 101* 70 - 99 mg/dL Final  . BUN 12/03/2013 24* 6 - 23 mg/dL Final  . Creatinine, Ser 12/03/2013 1.1  0.4 - 1.2 mg/dL Final  . Total Bilirubin 12/03/2013 0.7  0.2 -  1.2 mg/dL Final  . Alkaline Phosphatase 12/03/2013 79  39 - 117 U/L Final  . AST 12/03/2013 15  0 - 37 U/L Final  . ALT 12/03/2013 16  0 - 35 U/L Final  . Total Protein 12/03/2013 7.7  6.0 - 8.3 g/dL Final  . Albumin 12/03/2013 3.5  3.5 - 5.2 g/dL Final  . Calcium 12/03/2013 9.4  8.4 - 10.5 mg/dL Final  . GFR 12/03/2013 54.99* >60.00 mL/min Final  . WBC  12/03/2013 9.7  4.0 - 10.5 K/uL Final  . RBC 12/03/2013 4.83  3.87 - 5.11 Mil/uL Final  . Hemoglobin 12/03/2013 13.7  12.0 - 15.0 g/dL Final  . HCT 12/03/2013 41.5  36.0 - 46.0 % Final  . MCV 12/03/2013 86.1  78.0 - 100.0 fl Final  . MCHC 12/03/2013 33.0  30.0 - 36.0 g/dL Final  . RDW 12/03/2013 15.0  11.5 - 15.5 % Final  . Platelets 12/03/2013 319.0  150.0 - 400.0 K/uL Final  . Neutrophils Relative % 12/03/2013 77.6* 43.0 - 77.0 % Final  . Lymphocytes Relative 12/03/2013 14.9  12.0 - 46.0 % Final  . Monocytes Relative 12/03/2013 5.0  3.0 - 12.0 % Final  . Eosinophils Relative 12/03/2013 2.1  0.0 - 5.0 % Final  . Basophils Relative 12/03/2013 0.4  0.0 - 3.0 % Final  . Neutro Abs 12/03/2013 7.6  1.4 - 7.7 K/uL Final  . Lymphs Abs 12/03/2013 1.5  0.7 - 4.0 K/uL Final  . Monocytes Absolute 12/03/2013 0.5  0.1 - 1.0 K/uL Final  . Eosinophils Absolute 12/03/2013 0.2  0.0 - 0.7 K/uL Final  . Basophils Absolute 12/03/2013 0.0  0.0 - 0.1 K/uL Final  . Hgb A1c MFr Bld 12/03/2013 6.1  4.6 - 6.5 % Final   Glycemic Control Guidelines for People with Diabetes:Non Diabetic:  <6%Goal of Therapy: <7%Additional Action Suggested:  >8%   . TSH 12/03/2013 1.40  0.35 - 4.50 uIU/mL Final  Office Visit on 12/03/2013  Component Date Value Ref Range Status  . HM Pap smear 10/31/2013 normal by her report   Final     X-Rays:Us Extrem Up Right Ltd  12/04/2013   MSK US performed of: Right  This study was ordered, performed, and interpreted by Charlann Boxer D.O.  Shoulder:  Supraspinatus significant improvement in the calcific changes seen  previously but still degenerative changes.  Subscapularis: Appears normal on long and transverse views.  Teres Minor: Appears normal on long and transverse views.  AC joint: Capsule undistended, no geyser sign.  Glenohumeral Joint: Appears normal without effusion. Mild to moderate  joint arthritis noted  Glenoid Labrum: Intact without visualized tears.  Biceps Tendon: Appears normal  on long and transverse views, no fraying of  tendon, tendon located in intertubercular groove, no subluxation with  shoulder internal or external rotation.  Impression: Subacromial bursitis with mild degenerative as well as acute  changes of the rotator cuff as well as joint space.    EKG: Orders placed or performed during the hospital encounter of 06/06/13  . EKG 12 lead  . EKG 12 lead     Hospital Course: RHEDA KASSAB is a 63 y.o. who was admitted to [facility]. They were brought to the operating room on 12/16/2013 and underwent Procedure(s): LEFT TOTAL KNEE ARTHROPLASTY.  Patient tolerated the procedure well and was later transferred to the recovery room and then to the orthopaedic floor for postoperative care.  They were given PO and IV analgesics for pain control following  their surgery.  They were given 24 hours of postoperative antibiotics of  Anti-infectives    Start     Dose/Rate Route Frequency Ordered Stop   12/16/13 1400  ceFAZolin (ANCEF) IVPB 2 g/50 mL premix     2 g100 mL/hr over 30 Minutes Intravenous Every 6 hours 12/16/13 1115 12/16/13 2010   12/16/13 0552  ceFAZolin (ANCEF) IVPB 2 g/50 mL premix     2 g100 mL/hr over 30 Minutes Intravenous On call to O.R. 12/16/13 5625 12/16/13 0809     and started on DVT prophylaxis in the form of Xarelto.   PT and OT were ordered for total joint protocol.  Discharge planning consulted to help with postop disposition and equipment needs.  Patient had a decent night on the evening of surgery.  They started to get up OOB with therapy on day one. Hemovac drain was pulled without difficulty.  Continued to work with therapy into day two.  Dressing was changed on day two and the incision was healing well. Patient was seen in rounds and was ready to go home.  Discharge home with home health Diet - Cardiac diet Follow up - in 2 weeks Activity - WBAT Disposition - Home Condition Upon Discharge - Good D/C Meds - See DC Summary DVT  Prophylaxis - Xarelto for a total of three weeks      Discharge Instructions    Call MD / Call 911    Complete by:  As directed   If you experience chest pain or shortness of breath, CALL 911 and be transported to the hospital emergency room.  If you develope a fever above 101 F, pus (white drainage) or increased drainage or redness at the wound, or calf pain, call your surgeon's office.     Change dressing    Complete by:  As directed   Change dressing daily with sterile 4 x 4 inch gauze dressing and apply TED hose. Do not submerge the incision under water.     Constipation Prevention    Complete by:  As directed   Drink plenty of fluids.  Prune juice may be helpful.  You may use a stool softener, such as Colace (over the counter) 100 mg twice a day.  Use MiraLax (over the counter) for constipation as needed.     Diet - low sodium heart healthy    Complete by:  As directed      Discharge instructions    Complete by:  As directed   Pick up stool softner and laxative for home. Do not submerge incision under water. May shower. Continue to use ice for pain and swelling from surgery. Take Xarelto for two and a half more weeks, then discontinue Xarelto. Once the patient has completed the blood thinner regimen, then take a Baby 81 mg Aspirin daily for three more weeks.     Do not put a pillow under the knee. Place it under the heel.    Complete by:  As directed      Do not sit on low chairs, stoools or toilet seats, as it may be difficult to get up from low surfaces    Complete by:  As directed      Driving restrictions    Complete by:  As directed   No driving until released by the physician.     Increase activity slowly as tolerated    Complete by:  As directed      Lifting restrictions    Complete by:  As directed   No lifting until released by the physician.     Patient may shower    Complete by:  As directed   You may shower without a dressing once there is no drainage.  Do not  wash over the wound.  If drainage remains, do not shower until drainage stops.     TED hose    Complete by:  As directed   Use stockings (TED hose) for 3 weeks on both leg(s).  You may remove them at night for sleeping.     Weight bearing as tolerated    Complete by:  As directed             Medication List    STOP taking these medications        Diclofenac Sodium 2 % Soln     estrogen (conjugated)-medroxyprogesterone 0.3-1.5 MG per tablet  Commonly known as:  PREMPRO     meloxicam 15 MG tablet  Commonly known as:  MOBIC      TAKE these medications        acetaminophen 650 MG CR tablet  Commonly known as:  TYLENOL  Take 650 mg by mouth every 8 (eight) hours as needed for pain.     cetirizine 10 MG tablet  Commonly known as:  ZYRTEC  Take 10 mg by mouth daily as needed for allergies.     citalopram 20 MG tablet  Commonly known as:  CELEXA  Take 20 mg by mouth at bedtime.     losartan-hydrochlorothiazide 100-25 MG per tablet  Commonly known as:  HYZAAR  Take 1 tablet by mouth every morning.     losartan-hydrochlorothiazide 100-25 MG per tablet  Commonly known as:  HYZAAR  TAKE ONE TABLET BY MOUTH ONE TIME DAILY     methocarbamol 500 MG tablet  Commonly known as:  ROBAXIN  Take 1 tablet (500 mg total) by mouth every 6 (six) hours as needed for muscle spasms.     oxyCODONE 5 MG immediate release tablet  Commonly known as:  Oxy IR/ROXICODONE  Take 1-2 tablets (5-10 mg total) by mouth every 3 (three) hours as needed for moderate pain, severe pain or breakthrough pain.     rivaroxaban 10 MG Tabs tablet  Commonly known as:  XARELTO  - Take 1 tablet (10 mg total) by mouth daily with breakfast. Take Xarelto for two and a half more weeks, then discontinue Xarelto.  - Once the patient has completed the blood thinner regimen, then take a Baby 81 mg Aspirin daily for three more weeks.     traMADol 50 MG tablet  Commonly known as:  ULTRAM  Take 1 tablet (50 mg total) by  mouth every 6 (six) hours as needed (mild to moderate).       Follow-up Information    Follow up with [facility].   Why:  home health physical therapy   Contact information:   [address] [place] [address] [phone]       Follow up with [name], MD. Schedule an appointment as soon as possible for a visit on 12/31/2013.   Specialty:  Orthopedic Surgery   Why:  Call office at [phone] to set up appointment on Tuesday 12/31/2013   Contact information:   [address] [place] [address] [phone]       Signed: Arlee Muslim, PA-C Orthopaedic Surgery 12/19/2013, 10:16 AM

## 2013-12-17 NOTE — Progress Notes (Signed)
Educated patient on Xarelto/anticoagulation. Discussed holding Mobic until finished with Xarelto due to a small increase in bleeding risk. Also discussed holding Prempro during the post-op period due to increased chance of DVT which she said she could tolerate.    Romeo Rabon, PharmD, pager [phone]. 12/17/2013,3:16 PM.

## 2013-12-17 NOTE — Evaluation (Signed)
Occupational Therapy Evaluation Patient Details Name: Kelsey Church MRN: [id number] DOB: March 14, 1950 Today's Date: 12/17/2013    History of Present Illness LTKA   Clinical Impression   This 63 year old female was admitted for the above surgery.  All education was completed.  No further OT is needed at this time.      Follow Up Recommendations  No OT follow up    Equipment Recommendations  None recommended by OT    Recommendations for Other Services       Precautions / Restrictions Precautions Precautions: Knee Required Braces or Orthoses: Knee Immobilizer - Left Knee Immobilizer - Left: Discontinue once straight leg raise with < 10 degree lag Restrictions Weight Bearing Restrictions: No      Mobility Bed Mobility            not tested:  Pt up in chair      Transfers   Equipment used: Rolling walker (2 wheeled) Transfers: Sit to/from Stand Sit to Stand: Min guard;Min assist         General transfer comment: min from recliner; min guard from commode  Cues for UE/LE placement    Balance                                            ADL Overall ADL's : Needs assistance/impaired     Grooming: Supervision/safety;Standing   Upper Body Bathing: Set up;Sitting   Lower Body Bathing: Minimal assistance;Sit to/from stand   Upper Body Dressing : Set up;Sitting   Lower Body Dressing: Moderate assistance;Sit to/from stand   Toilet Transfer: Minimal assistance;BSC;Ambulation   Toileting- Water quality scientist and Hygiene: Min guard;Sit to/from stand         General ADL Comments: performed adl from recliner:  ambulated to bathroom to use commode.  Pt did not feel she needed to practice shower transfer:  she has an accessible shower.  Pt also not interested in AE--sister will assist her.  Cues for UE/LE placement with sit to stand     Vision                     Perception     Praxis      Pertinent Vitals/Pain Pain  Assessment: 0-10 Pain Score: 4  Pain Location: L knee Pain Descriptors / Indicators: Aching Pain Intervention(s): Limited activity within patient's tolerance;Monitored during session;Premedicated before session;Repositioned     Hand Dominance Right   Extremity/Trunk Assessment Upper Extremity Assessment Upper Extremity Assessment: Overall WFL for tasks assessed           Communication Communication Communication: No difficulties   Cognition Arousal/Alertness: Awake/alert Behavior During Therapy: WFL for tasks assessed/performed Overall Cognitive Status: Within Functional Limits for tasks assessed                     General Comments       Exercises       Shoulder Instructions      Home Living Family/patient expects to be discharged to:: Private residence Living Arrangements: Other relatives Available Help at Discharge: Available 24 hours/day;Family               Bathroom Shower/Tub: Walk-in Psychologist, prison and probation services: Standard     Home Equipment: Bedside commode          Prior Functioning/Environment Level of Independence: Independent  OT Diagnosis: Generalized weakness   OT Problem List:     OT Treatment/Interventions:      OT Goals(Current goals can be found in the care plan section)    OT Frequency:     Barriers to D/C:            Co-evaluation              End of Session CPM Left Knee CPM Left Knee: Off  Activity Tolerance: Patient tolerated treatment well Patient left: in chair;with call bell/phone within reach   Time: [id number] OT Time Calculation (min): 34 min Charges:  OT General Charges $OT Visit: 1 Procedure OT Evaluation $Initial OT Evaluation Tier I: 1 Procedure OT Treatments $Self Care/Home Management : 23-37 mins G-Codes:    Caress Reffitt 12/19/2013, 9:37 AM  Lesle Chris, OTR/L [phone] Dec 19, 2013

## 2013-12-17 NOTE — Plan of Care (Signed)
Problem: Acute Rehab PT Goals(only PT should resolve) Goal: Pt Will Go Supine/Side To Sit Outcome: Completed/Met Date Met:  12/17/13 Goal: Pt Will Go Sit To Supine/Side Outcome: Completed/Met Date Met:  12/17/13 Goal: Patient Will Transfer Sit To/From Stand Outcome: Completed/Met Date Met:  12/17/13 Goal: Pt Will Ambulate Outcome: Completed/Met Date Met:  12/17/13

## 2013-12-17 NOTE — Discharge Instructions (Signed)
Information on my medicine - XARELTO (Rivaroxaban)  This medication education was reviewed with me or my healthcare representative as part of my discharge preparation.  The pharmacist that spoke with me during my hospital stay was:  Lolita Patella, [facility]  Why was Xarelto prescribed for you? Xarelto was prescribed for you to reduce the risk of blood clots forming after orthopedic surgery. The medical term for these abnormal blood clots is venous thromboembolism (VTE).  What do you need to know about xarelto ? Take your Xarelto ONCE DAILY at the same time every day. You may take it either with or without food.  If you have difficulty swallowing the tablet whole, you may crush it and mix in applesauce just prior to taking your dose.  Take Xarelto exactly as prescribed by your doctor and DO NOT stop taking Xarelto without talking to the doctor who prescribed the medication.  Stopping without other VTE prevention medication to take the place of Xarelto may increase your risk of developing a clot.  After discharge, you should have regular check-up appointments with your healthcare provider that is prescribing your Xarelto.    What do you do if you miss a dose? If you miss a dose, take it as soon as you remember on the same day then continue your regularly scheduled once daily regimen the next day. Do not take two doses of Xarelto on the same day.   Important Safety Information A possible side effect of Xarelto is bleeding. You should call your healthcare provider right away if you experience any of the following: ? Bleeding from an injury or your nose that does not stop. ? Unusual colored urine (red or dark brown) or unusual colored stools (red or black). ? Unusual bruising for unknown reasons. ? A serious fall or if you hit your head (even if there is no bleeding).  Some medicines may interact with Xarelto and might increase your risk of bleeding while on Xarelto. To help  avoid this, consult your healthcare provider or pharmacist prior to using any new prescription or non-prescription medications, including herbals, vitamins, non-steroidal anti-inflammatory drugs (NSAIDs) and supplements.  This website has more information on Xarelto: [email or web address]-[email or web address].[email or web address]     Dr. Gaynelle Arabian Total Joint Specialist [facility] [address], [place],  [address] [phone]  TOTAL KNEE REPLACEMENT POSTOPERATIVE DIRECTIONS    Knee Rehabilitation, Guidelines Following Surgery  Results after knee surgery are often greatly improved when you follow the exercise, range of motion and muscle strengthening exercises prescribed by your doctor. Safety measures are also important to protect the knee from further injury. Any time any of these exercises cause you to have increased pain or swelling in your knee joint, decrease the amount until you are comfortable again and slowly increase them. If you have problems or questions, call your caregiver or physical therapist for advice.   HOME CARE INSTRUCTIONS  Remove items at home which could result in a fall. This includes throw rugs or furniture in walking pathways.  Continue medications as instructed at time of discharge. You may have some home medications which will be placed on hold until you complete the course of blood thinner medication.  You may start showering once you are discharged home but do not submerge the incision under water. Just pat the incision dry and apply a dry gauze dressing on daily. Walk with walker as instructed.  You may resume a sexual relationship in one month or when given the OK by  your doctor.   Use walker as long as suggested by your caregivers.  Avoid periods of inactivity such as sitting longer than an hour when not asleep. This helps prevent blood clots.  You may put full weight on your legs and walk as much as is comfortable.  You may return to work once you are  cleared by your doctor.  Do not drive a car for 6 weeks or until released by you surgeon.   Do not drive while taking narcotics.  Wear the elastic stockings for three weeks following surgery during the day but you may remove then at night. Make sure you keep all of your appointments after your operation with all of your doctors and caregivers. You should call the office at the above phone number and make an appointment for approximately two weeks after the date of your surgery. Change the dressing daily and reapply a dry dressing each time. Please pick up a stool softener and laxative for home use as long as you are requiring pain medications.  Continue to use ice on the knee for pain and swelling from surgery. You may notice swelling that will progress down to the foot and ankle.  This is normal after surgery.  Elevate the leg when you are not up walking on it.   It is important for you to complete the blood thinner medication as prescribed by your doctor.  Continue to use the breathing machine which will help keep your temperature down.  It is common for your temperature to cycle up and down following surgery, especially at night when you are not up moving around and exerting yourself.  The breathing machine keeps your lungs expanded and your temperature down.  RANGE OF MOTION AND STRENGTHENING EXERCISES  Rehabilitation of the knee is important following a knee injury or an operation. After just a few days of immobilization, the muscles of the thigh which control the knee become weakened and shrink (atrophy). Knee exercises are designed to build up the tone and strength of the thigh muscles and to improve knee motion. Often times heat used for twenty to thirty minutes before working out will loosen up your tissues and help with improving the range of motion but do not use heat for the first two weeks following surgery. These exercises can be done on a training (exercise) mat, on the floor, on a  table or on a bed. Use what ever works the best and is most comfortable for you Knee exercises include:  Leg Lifts - While your knee is still immobilized in a splint or cast, you can do straight leg raises. Lift the leg to 60 degrees, hold for 3 sec, and slowly lower the leg. Repeat 10-20 times 2-3 times daily. Perform this exercise against resistance later as your knee gets better.  Quad and Hamstring Sets - Tighten up the muscle on the front of the thigh (Quad) and hold for 5-10 sec. Repeat this 10-20 times hourly. Hamstring sets are done by pushing the foot backward against an object and holding for 5-10 sec. Repeat as with quad sets.  A rehabilitation program following serious knee injuries can speed recovery and prevent re-injury in the future due to weakened muscles. Contact your doctor or a physical therapist for more information on knee rehabilitation.   SKILLED REHAB INSTRUCTIONS: If the patient is transferred to a skilled rehab facility following release from the hospital, a list of the current medications will be sent to the facility for the patient  to continue.  When discharged from the skilled rehab facility, please have the facility set up the patient's [place] prior to being released. Also, the skilled facility will be responsible for providing the patient with their medications at time of release from the facility to include their pain medication, the muscle relaxants, and their blood thinner medication. If the patient is still at the rehab facility at time of the two week follow up appointment, the skilled rehab facility will also need to assist the patient in arranging follow up appointment in our office and any transportation needs.  MAKE SURE YOU:  Understand these instructions.  Will watch your condition.  Will get help right away if you are not doing well or get worse.    Pick up stool softner and laxative for home. Do not submerge incision under water. May  shower. Continue to use ice for pain and swelling from surgery.  Take Xarelto for two and a half more weeks, then discontinue Xarelto. Once the patient has completed the blood thinner regimen, then take a Baby 81 mg Aspirin daily for three more weeks.   Postoperative Constipation Protocol  Constipation - defined medically as fewer than three stools per week and severe constipation as less than one stool per week.  One of the most common issues patients have following surgery is constipation.  Even if you have a regular bowel pattern at home, your normal regimen is likely to be disrupted due to multiple reasons following surgery.  Combination of anesthesia, postoperative narcotics, change in appetite and fluid intake all can affect your bowels.  In order to avoid complications following surgery, here are some recommendations in order to help you during your recovery period.  Colace (docusate) - Pick up an over-the-counter form of Colace or another stool softener and take twice a day as long as you are requiring postoperative pain medications.  Take with a full glass of water daily.  If you experience loose stools or diarrhea, hold the colace until you stool forms back up.  If your symptoms do not get better within 1 week or if they get worse, check with your doctor.  Dulcolax (bisacodyl) - Pick up over-the-counter and take as directed by the product packaging as needed to assist with the movement of your bowels.  Take with a full glass of water.  Use this product as needed if not relieved by Colace only.   MiraLax (polyethylene glycol) - Pick up over-the-counter to have on hand.  MiraLax is a solution that will increase the amount of water in your bowels to assist with bowel movements.  Take as directed and can mix with a glass of water, juice, soda, coffee, or tea.  Take if you go more than two days without a movement. Do not use MiraLax more than once per day. Call your doctor if you are still  constipated or irregular after using this medication for 7 days in a row.  If you continue to have problems with postoperative constipation, please contact the office for further assistance and recommendations.  If you experience "the worst abdominal pain ever" or develop nausea or vomiting, please contact the office immediatly for further recommendations for treatment.

## 2013-12-17 NOTE — Progress Notes (Signed)
Physical Therapy Treatment Patient Details Name: TEJAL CLENDENON MRN: [id number] DOB: [birth year] Today's Date: 12/17/2013    History of Present Illness LTKA    PT Comments    **Pt is progressing well with mobility, she walked 400' with RW independently this afternoon. Min assist for TKA exercises. Will plan to do stair training in tomorrow morning, then it is expected pt will be ready to DC home. *  Follow Up Recommendations  Home health PT;Supervision/Assistance - 24 hour     Equipment Recommendations  None recommended by PT    Recommendations for Other Services       Precautions / Restrictions Precautions Precautions: Knee Required Braces or Orthoses: Knee Immobilizer - Left Knee Immobilizer - Left: Discontinue once straight leg raise with < 10 degree lag Restrictions Weight Bearing Restrictions: No    Mobility  Bed Mobility Overal bed mobility: Modified Independent Bed Mobility: Sit to Supine       Sit to supine: Modified independent (Device/Increase time)   General bed mobility comments:  self assists LLE with RLE  Transfers Overall transfer level: Needs assistance Equipment used: Rolling walker (2 wheeled) Transfers: Sit to/from Stand Sit to Stand: Modified independent (Device/Increase time)            Ambulation/Gait Ambulation/Gait assistance: Modified independent (Device/Increase time) Ambulation Distance (Feet): 400 Feet Assistive device: Rolling walker (2 wheeled) Gait Pattern/deviations: Step-to pattern;Antalgic     General Gait Details: good sequencing   Stairs            Wheelchair Mobility    Modified Rankin (Stroke Patients Only)       Balance                                    Cognition Arousal/Alertness: Awake/alert Behavior During Therapy: WFL for tasks assessed/performed Overall Cognitive Status: Within Functional Limits for tasks assessed                      Exercises Total Joint  Exercises Ankle Circles/Pumps: AROM;Both;10 reps;Supine Quad Sets: AROM;Both;10 reps;Supine Short Arc Quad: AAROM;Left;10 reps;Supine Heel Slides: AAROM;Left;10 reps;Supine Hip ABduction/ADduction: AAROM;Left;10 reps;Supine Goniometric ROM: -10-45* L knee AAROM    General Comments        Pertinent Vitals/Pain Pain Score: 5  Pain Location: L knee with walking Pain Descriptors / Indicators: Sore Pain Intervention(s): Monitored during session;Premedicated before session;Ice applied    Home Living                      Prior Function            PT Goals (current goals can now be found in the care plan section) Acute Rehab PT Goals Patient Stated Goal: to  walk without pain, to travel (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) PT Goal Formulation: With patient/family Time For Goal Achievement: 12/21/13 Potential to Achieve Goals: Good Progress towards PT goals: Progressing toward goals    Frequency  7X/week    PT Plan Current plan remains appropriate    Co-evaluation             End of Session Equipment Utilized During Treatment: Gait belt Activity Tolerance: Patient tolerated treatment well Patient left: with call bell/phone within reach;with family/visitor present;in bed     Time: 1349-1419 PT Time Calculation (min): 30 min  Charges:  $Gait Training: 8-22 mins $Therapeutic Exercise:  8-22 mins                    G Codes:      Philomena Doheny 12/17/2013, 2:41 PM [phone]

## 2013-12-17 NOTE — Care Management Note (Addendum)
    Page 1 of 2   12/17/2013     9:42:51 AM CARE MANAGEMENT NOTE 12/17/2013  Patient:  JALIANA, MEDELLIN   Account Number:  [id number]  Date Initiated:  12/17/2013  Documentation initiated by:  [facility]  Subjective/Objective Assessment:   adm: LEFT TOTAL KNEE ARTHROPLASTY (Left)     Action/Plan:   discharge planning   Anticipated DC Date:  12/17/2013   Anticipated DC Plan:  [place]  CM consult      [facility] Choice  HOME HEALTH   Choice offered to / List presented to:  C-1 Patient   DME arranged  NA      DME agency  NA     [place] arranged  HH-2 PT      [place]   Status of service:  Completed, signed off Medicare Important Message given?   (If response is "NO", the following Medicare IM given date fields will be blank) Date Medicare IM given:   Medicare IM given by:   Date Additional Medicare IM given:   Additional Medicare IM given by:    Discharge Disposition:  [place]  Per UR Regulation:    If discussed at Long Length of Stay Meetings, dates discussed:    Comments:  12/17/13 07:45 CM met with pt in room to confirm choice of home health agency.  Pt chooses [facility] for HHPT.  Pt has 3n1 and rolling walker at home from previous surgery and needs NO OTHER DME.  Address and contact information verified with pt.  Referral called to [facility]. No other CM needs were communicated.  Mariane Masters, BSN, [place].

## 2013-12-17 NOTE — Progress Notes (Signed)
Physical Therapy Treatment Patient Details Name: Kelsey Church MRN: [id number] DOB: 01/04/1951 Today's Date: 12/17/2013    History of Present Illness LTKA    PT Comments    **Pt progressing well with mobility. She walked 150' with RW and supervision. Min A to perform R TKA exercises. *  Follow Up Recommendations  Home health PT;Supervision/Assistance - 24 hour     Equipment Recommendations  None recommended by PT    Recommendations for Other Services       Precautions / Restrictions Precautions Precautions: Knee Required Braces or Orthoses: Knee Immobilizer - Left Knee Immobilizer - Left: Discontinue once straight leg raise with < 10 degree lag Restrictions Weight Bearing Restrictions: No    Mobility  Bed Mobility Overal bed mobility: Needs Assistance Bed Mobility: Sit to Supine     Supine to sit: Min assist Sit to supine: Modified independent (Device/Increase time)   General bed mobility comments: cues to self assist LLE with RLE  Transfers Overall transfer level: Needs assistance Equipment used: Rolling walker (2 wheeled) Transfers: Sit to/from Stand Sit to Stand: Supervision         General transfer comment: cues for hand placement  Ambulation/Gait Ambulation/Gait assistance: Supervision Ambulation Distance (Feet): 150 Feet Assistive device: Rolling walker (2 wheeled) Gait Pattern/deviations: Step-to pattern;Antalgic     General Gait Details: good sequencing, cues for heel strike   Stairs            Wheelchair Mobility    Modified Rankin (Stroke Patients Only)       Balance                                    Cognition Arousal/Alertness: Awake/alert Behavior During Therapy: WFL for tasks assessed/performed Overall Cognitive Status: Within Functional Limits for tasks assessed                      Exercises Total Joint Exercises Ankle Circles/Pumps: AROM;Both;10 reps;Supine Quad Sets: AROM;Both;10  reps;Supine Short Arc Quad: AAROM;Left;10 reps;Supine Heel Slides: AAROM;Left;10 reps;Supine Hip ABduction/ADduction: AAROM;Left;10 reps;Supine Goniometric ROM: -10-45* L knee AAROM    General Comments        Pertinent Vitals/Pain Pain Assessment: 0-10 Pain Score: 5  Pain Location: L knee with walking Pain Descriptors / Indicators: Sore Pain Intervention(s): Monitored during session;Premedicated before session;Ice applied    Home Living Family/patient expects to be discharged to:: Private residence Living Arrangements: Other relatives Available Help at Discharge: Available 24 hours/day;Family         Home Equipment: Bedside commode      Prior Function Level of Independence: Independent          PT Goals (current goals can now be found in the care plan section) Acute Rehab PT Goals Patient Stated Goal: to  walk without pain, to travel PT Goal Formulation: With patient/family Time For Goal Achievement: 12/21/13 Potential to Achieve Goals: Good Progress towards PT goals: Progressing toward goals    Frequency  7X/week    PT Plan Current plan remains appropriate    Co-evaluation             End of Session Equipment Utilized During Treatment: Gait belt Activity Tolerance: Patient tolerated treatment well Patient left: with call bell/phone within reach;with family/visitor present;in bed     Time: 1015-1050 PT Time Calculation (min): 35 min  Charges:  $Gait Training: 8-22 mins $Therapeutic Exercise: 8-22 mins  G Codes:      Philomena Doheny 12/17/2013, 10:56 AM [phone]

## 2013-12-18 LAB — BASIC METABOLIC PANEL
ANION GAP: 11 (ref 5–15)
BUN: 22 mg/dL (ref 6–23)
CALCIUM: 8.9 mg/dL (ref 8.4–10.5)
CHLORIDE: 104 meq/L (ref 96–112)
CO2: 27 meq/L (ref 19–32)
Creatinine, Ser: 0.98 mg/dL (ref 0.50–1.10)
GFR calc Af Amer: 70 mL/min — ABNORMAL LOW (ref >90)
GFR calc non Af Amer: 60 mL/min — ABNORMAL LOW (ref >90)
GLUCOSE: 124 mg/dL — AB (ref 70–99)
Potassium: 3.8 mEq/L (ref 3.7–5.3)
SODIUM: 142 meq/L (ref 137–147)

## 2013-12-18 LAB — CBC
HCT: 30.6 % — ABNORMAL LOW (ref 36.0–46.0)
HEMOGLOBIN: 10.1 g/dL — AB (ref 12.0–15.0)
MCH: 28.9 pg (ref 26.0–34.0)
MCHC: 33 g/dL (ref 30.0–36.0)
MCV: 87.4 fL (ref 78.0–100.0)
Platelets: 188 10*3/uL (ref 150–400)
RBC: 3.5 MIL/uL — AB (ref 3.87–5.11)
RDW: 14.8 % (ref 11.5–15.5)
WBC: 9.1 10*3/uL (ref 4.0–10.5)

## 2013-12-18 NOTE — Progress Notes (Signed)
Pt to d/c home with [facility]. No DME needs. AVS reviewed and "My Chart" discussed with pt. Pt capable of verbalizing medications, dressing changes, signs and symptoms of infection, and follow-up appointments. Remains hemodynamically stable. No signs and symptoms of distress. Educated pt to return to ER in the case of SOB, dizziness, or chest pain.

## 2013-12-18 NOTE — Progress Notes (Signed)
Physical Therapy Treatment Patient Details Name: SONAKSHI CHAMBERS MRN: [id number] DOB: 1950-12-09 Today's Date: 12/18/2013    History of Present Illness LTKA    PT Comments    Much better, ready for DC.  Follow Up Recommendations  Home health PT;Supervision/Assistance - 24 hour     Equipment Recommendations  None recommended by PT    Recommendations for Other Services       Precautions / Restrictions Precautions Precautions: Knee    Mobility  Bed Mobility                  Transfers   Equipment used: Rolling walker (2 wheeled) Transfers: Sit to/from Stand Sit to Stand: Supervision         General transfer comment: cues for hand placement  Ambulation/Gait Ambulation/Gait assistance: Supervision Ambulation Distance (Feet): 300 Feet Assistive device: Rolling walker (2 wheeled) Gait Pattern/deviations: Step-through pattern;Step-to pattern;Antalgic     General Gait Details: good sequencing, in pain, in back   Stairs            Wheelchair Mobility    Modified Rankin (Stroke Patients Only)       Balance                                    Cognition Arousal/Alertness: Awake/alert                          Exercises Total Joint Exercises Ankle Circles/Pumps: AROM;Both;10 reps;Supine Quad Sets: AROM;Both;10 reps;Supine Short Arc Quad: AAROM;Left;10 reps;Supine Heel Slides: AAROM;Left;10 reps;Supine Hip ABduction/ADduction: AAROM;Left;10 reps;Supine Straight Leg Raises: AAROM;Left;10 reps;Supine Goniometric ROM: 10-50    General Comments        Pertinent Vitals/Pain Pain Score: 6  Pain Location: L knee    Home Living                      Prior Function            PT Goals (current goals can now be found in the care plan section) Progress towards PT goals: Progressing toward goals    Frequency  7X/week    PT Plan Current plan remains appropriate    Co-evaluation             End  of Session Equipment Utilized During Treatment: Left knee immobilizer Activity Tolerance: Patient tolerated treatment well Patient left: in chair;with call bell/phone within reach;with family/visitor present     Time: [id number] PT Time Calculation (min): 36 min  Charges:  $Gait Training: 8-22 mins $Therapeutic Exercise: 8-22 mins                    G Codes:      [name] 12/18/2013, 4:02 PM

## 2013-12-18 NOTE — Progress Notes (Signed)
Physical Therapy Treatment Patient Details Name: Kelsey Church MRN: [id number] DOB: 1950-05-13 Today's Date: 12/18/2013    History of Present Illness LTKA    PT Comments    In pain, just had meds, will rest and r have PT later, then DC.  Follow Up Recommendations  Home health PT;Supervision/Assistance - 24 hour     Equipment Recommendations  None recommended by PT    Recommendations for Other Services       Precautions / Restrictions Precautions Precautions: Knee    Mobility  Bed Mobility                  Transfers   Equipment used: Rolling walker (2 wheeled) Transfers: Sit to/from Stand Sit to Stand: Supervision         General transfer comment: cues for hand placement  Ambulation/Gait Ambulation/Gait assistance: Supervision Ambulation Distance (Feet): 10 Feet Assistive device: Rolling walker (2 wheeled)       General Gait Details: good sequencing, in pain, needs a rest   Stairs            Wheelchair Mobility    Modified Rankin (Stroke Patients Only)       Balance                                    Cognition Arousal/Alertness: Awake/alert                          Exercises      General Comments        Pertinent Vitals/Pain Pain Score: 6  Pain Location: L knee    Home Living                      Prior Function            PT Goals (current goals can now be found in the care plan section) Progress towards PT goals: Progressing toward goals    Frequency  7X/week    PT Plan Current plan remains appropriate    Co-evaluation             End of Session   Activity Tolerance: Patient limited by pain Patient left: in chair;with call bell/phone within reach     Time: [id number] PT Time Calculation (min): 14 min  Charges:  $Gait Training: 8-22 mins                    G Codes:      Kelsey Church 12/18/2013, 3:58 PM

## 2013-12-18 NOTE — Plan of Care (Signed)
Problem: Consults Goal: Total Joint Replacement Patient Education See Patient Education Module for education specifics.  Outcome: Completed/Met Date Met:  12/18/13 Goal: Diagnosis- Total Joint Replacement Outcome: Completed/Met Date Met:  12/18/13 Primary Total Knee LEFT Goal: Skin Care Protocol Initiated - if Braden Score 18 or less If consults are not indicated, leave blank or document N/A  Outcome: Not Applicable Date Met:  80/22/33 Goal: Nutrition Consult-if indicated Outcome: Not Applicable Date Met:  61/22/44 Goal: Diabetes Guidelines if Diabetic/Glucose > 140 If diabetic or lab glucose is > 140 mg/dl - Initiate Diabetes/Hyperglycemia Guidelines & Document Interventions  Outcome: Not Applicable Date Met:  97/53/00  Problem: Phase I Progression Outcomes Goal: Pain controlled with appropriate interventions Outcome: Completed/Met Date Met:  12/18/13 Goal: Dangle or out of bed evening of surgery Outcome: Completed/Met Date Met:  12/18/13 Goal: Initial discharge plan identified Outcome: Completed/Met Date Met:  12/18/13 Goal: Other Phase I Outcomes/Goals Outcome: Not Applicable Date Met:  51/10/21  Problem: Phase II Progression Outcomes Goal: Ambulates Outcome: Completed/Met Date Met:  12/18/13 Goal: Discharge plan established Outcome: Completed/Met Date Met:  12/18/13 Goal: Other Phase II Outcomes/Goals Outcome: Not Applicable Date Met:  11/73/56  Problem: Phase III Progression Outcomes Goal: Pain controlled on oral analgesia Outcome: Completed/Met Date Met:  12/18/13 Goal: Ambulates Outcome: Completed/Met Date Met:  12/18/13 Goal: Discharge plan remains appropriate-arrangements made Outcome: Completed/Met Date Met:  12/18/13 Goal: Anticoagulant follow-up in place Outcome: Not Applicable Date Met:  70/14/10 Xarelto VTE, no f/u needed. Goal: Other Phase III Outcomes/Goals Outcome: Not Applicable Date Met:  30/13/14  Problem: Discharge Progression Outcomes Goal:  Barriers To Progression Addressed/Resolved Outcome: Not Applicable Date Met:  38/88/75 Goal: CMS/Neurovascular status at or above baseline Outcome: Completed/Met Date Met:  12/18/13 Goal: Anticoagulant follow-up in place Outcome: Not Applicable Date Met:  79/72/82 Goal: Pain controlled with appropriate interventions Outcome: Completed/Met Date Met:  12/18/13 Goal: Hemodynamically stable Outcome: Completed/Met Date Met:  07/16/54 Goal: Complications resolved/controlled Outcome: Completed/Met Date Met:  12/18/13 Goal: Tolerates diet Outcome: Completed/Met Date Met:  12/18/13 Goal: Activity appropriate for discharge plan Outcome: Completed/Met Date Met:  12/18/13 Goal: Ambulates safely using assistive device Outcome: Completed/Met Date Met:  12/18/13

## 2013-12-18 NOTE — Progress Notes (Signed)
   Subjective: 2 Days Post-Op Procedure(s) (LRB): LEFT TOTAL KNEE ARTHROPLASTY (Left) Patient reports pain as mild.   Patient seen in rounds with Dr. Wynelle Link. Patient is well, and has had no acute complaints or problems Patient is ready to go home  Objective: Vital signs in last 24 hours: Temp:  [98 F (36.7 C)-98.5 F (36.9 C)] 98 F (36.7 C) (11/04 0455) Pulse Rate:  [61-76] 72 (11/04 0455) Resp:  [16-19] 18 (11/04 0800) BP: (124-143)/(64-85) 134/72 mmHg (11/04 0455) SpO2:  [99 %-100 %] 100 % (11/04 0455)  Intake/Output from previous day:  Intake/Output Summary (Last 24 hours) at 12/18/13 0821 Last data filed at 12/18/13 0456  Gross per 24 hour  Intake 920.83 ml  Output   1550 ml  Net -629.17 ml     Labs:  Recent Labs  12/17/13 0435 12/18/13 0442  HGB 10.5* 10.1*    Recent Labs  12/17/13 0435 12/18/13 0442  WBC 10.0 9.1  RBC 3.70* 3.50*  HCT 32.2* 30.6*  PLT 178 188    Recent Labs  12/17/13 0435 12/18/13 0442  NA 140 142  K 3.9 3.8  CL 104 104  CO2 27 27  BUN 16 22  CREATININE 0.98 0.98  GLUCOSE 148* 124*  CALCIUM 8.8 8.9   No results for input(s): LABPT, INR in the last 72 hours.  EXAM: General - Patient is Alert, Appropriate and Oriented Extremity - Neurovascular intact Sensation intact distally Dorsiflexion/Plantar flexion intact Incision - clean, dry, no drainage, healing Motor Function - intact, moving foot and toes well on exam.   Assessment/Plan: 2 Days Post-Op Procedure(s) (LRB): LEFT TOTAL KNEE ARTHROPLASTY (Left) Procedure(s) (LRB): LEFT TOTAL KNEE ARTHROPLASTY (Left) Past Medical History  Diagnosis Date  . Melanoma in situ   . Hypertension   . Sleep apnea 09/2011    CPAP  . Sleep apnea   . Depression   . Complication of anesthesia     "chills every evening at sundown x 2 weeks"- no fever  . 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 History of shingles     NO RESIDUAL PROBLEMS    . Pain     LOWER BACK PAIN   Principal Problem:   OA (osteoarthritis) of knee  Estimated body mass index is 29.87 kg/(m^2) as calculated from the following:   Height as of this encounter: 5\' 6"  (1.676 m).   Weight as of this encounter: 83.915 kg (185 lb). Up with therapy Discharge home with home health Diet - Cardiac diet Follow up - in 2 weeks Activity - WBAT Disposition - Home Condition Upon Discharge - Good D/C Meds - See DC Summary DVT Prophylaxis - Xarelto for a total of three weeks  Arlee Muslim, PA-C Orthopaedic Surgery 12/18/2013, 8:21 AM

## 2013-12-25 ENCOUNTER — Telehealth: Payer: Self-pay | Admitting: Internal Medicine

## 2013-12-25 DIAGNOSIS — I10 Essential (primary) hypertension: Secondary | ICD-10-CM

## 2013-12-25 NOTE — Telephone Encounter (Signed)
Pt is experiencing hand cramps last two days. Pt is on a blood thinner, pt does not know if she needs OV. Please advise.  [phone]

## 2013-12-26 NOTE — Telephone Encounter (Signed)
Labs ordered.

## 2013-12-27 ENCOUNTER — Encounter (HOSPITAL_BASED_OUTPATIENT_CLINIC_OR_DEPARTMENT_OTHER): Payer: Self-pay

## 2013-12-27 ENCOUNTER — Emergency Department (HOSPITAL_BASED_OUTPATIENT_CLINIC_OR_DEPARTMENT_OTHER)
Admission: EM | Admit: 2013-12-27 | Discharge: 2013-12-27 | Disposition: A | Discharge status: Home / Self Care | Payer: BC Managed Care – PPO | Attending: Emergency Medicine | Admitting: Emergency Medicine

## 2013-12-27 DIAGNOSIS — M1711 Unilateral primary osteoarthritis, right knee: Secondary | ICD-10-CM | POA: Diagnosis not present

## 2013-12-27 DIAGNOSIS — G4730 Sleep apnea, unspecified: Secondary | ICD-10-CM | POA: Diagnosis not present

## 2013-12-27 DIAGNOSIS — F329 Major depressive disorder, single episode, unspecified: Secondary | ICD-10-CM | POA: Insufficient documentation

## 2013-12-27 DIAGNOSIS — Z86008 Personal history of in-situ neoplasm of other site: Secondary | ICD-10-CM | POA: Diagnosis not present

## 2013-12-27 DIAGNOSIS — Z7901 Long term (current) use of anticoagulants: Secondary | ICD-10-CM | POA: Insufficient documentation

## 2013-12-27 DIAGNOSIS — I10 Essential (primary) hypertension: Secondary | ICD-10-CM | POA: Insufficient documentation

## 2013-12-27 DIAGNOSIS — B029 Zoster without complications: Secondary | ICD-10-CM | POA: Insufficient documentation

## 2013-12-27 DIAGNOSIS — R21 Rash and other nonspecific skin eruption: Secondary | ICD-10-CM | POA: Diagnosis present

## 2013-12-27 DIAGNOSIS — Z96651 Presence of right artificial knee joint: Secondary | ICD-10-CM | POA: Diagnosis not present

## 2013-12-27 DIAGNOSIS — Z79899 Other long term (current) drug therapy: Secondary | ICD-10-CM | POA: Insufficient documentation

## 2013-12-27 DIAGNOSIS — Z9981 Dependence on supplemental oxygen: Secondary | ICD-10-CM | POA: Diagnosis not present

## 2013-12-27 LAB — COMPREHENSIVE METABOLIC PANEL
ALT: 19 U/L (ref 0–35)
ANION GAP: 11 (ref 5–15)
AST: 18 U/L (ref 0–37)
Albumin: 3.3 g/dL — ABNORMAL LOW (ref 3.5–5.2)
Alkaline Phosphatase: 101 U/L (ref 39–117)
BILIRUBIN TOTAL: 0.5 mg/dL (ref 0.3–1.2)
BUN: 19 mg/dL (ref 6–23)
CALCIUM: 9.5 mg/dL (ref 8.4–10.5)
CHLORIDE: 97 meq/L (ref 96–112)
CO2: 30 mEq/L (ref 19–32)
CREATININE: 0.9 mg/dL (ref 0.50–1.10)
GFR, EST AFRICAN AMERICAN: 77 mL/min — AB (ref >90)
GFR, EST NON AFRICAN AMERICAN: 67 mL/min — AB (ref >90)
GLUCOSE: 100 mg/dL — AB (ref 70–99)
Potassium: 3.9 mEq/L (ref 3.7–5.3)
Sodium: 138 mEq/L (ref 137–147)
Total Protein: 7.1 g/dL (ref 6.0–8.3)

## 2013-12-27 LAB — CBC WITH DIFFERENTIAL/PLATELET
BASOS ABS: 0 10*3/uL (ref 0.0–0.1)
Basophils Relative: 0 % (ref 0–1)
EOS PCT: 2 % (ref 0–5)
Eosinophils Absolute: 0.2 10*3/uL (ref 0.0–0.7)
HEMATOCRIT: 34.2 % — AB (ref 36.0–46.0)
HEMOGLOBIN: 10.7 g/dL — AB (ref 12.0–15.0)
LYMPHS PCT: 12 % (ref 12–46)
Lymphs Abs: 1.2 10*3/uL (ref 0.7–4.0)
MCH: 28.8 pg (ref 26.0–34.0)
MCHC: 31.3 g/dL (ref 30.0–36.0)
MCV: 91.9 fL (ref 78.0–100.0)
MONO ABS: 0.9 10*3/uL (ref 0.1–1.0)
MONOS PCT: 9 % (ref 3–12)
NEUTROS ABS: 7.6 10*3/uL (ref 1.7–7.7)
Neutrophils Relative %: 77 % (ref 43–77)
Platelets: 403 10*3/uL — ABNORMAL HIGH (ref 150–400)
RBC: 3.72 MIL/uL — ABNORMAL LOW (ref 3.87–5.11)
RDW: 15.1 % (ref 11.5–15.5)
WBC: 9.8 10*3/uL (ref 4.0–10.5)

## 2013-12-27 LAB — PROTIME-INR
INR: 1.17 (ref 0.00–1.49)
PROTHROMBIN TIME: 14.9 s (ref 11.6–15.2)

## 2013-12-27 MED ORDER — VALACYCLOVIR HCL 1 G PO TABS: 1000.0000 mg | ORAL_TABLET | Freq: Three times a day (TID) | ORAL | Status: AC

## 2013-12-27 MED ORDER — VALACYCLOVIR HCL 500 MG PO TABS
500.0000 mg | ORAL_TABLET | Freq: Once | ORAL | Status: DC
  Filled 2013-12-27: qty 1

## 2013-12-27 MED ORDER — HYDROXYZINE HCL 25 MG PO TABS: 25.0000 mg | ORAL_TABLET | Freq: Four times a day (QID) | ORAL | Status: DC

## 2013-12-27 NOTE — ED Notes (Signed)
Present today for Rash. Location: Left ear, left mastoid process, both arms, chest and neck. Rash are red, flat and raised, itchy, zyrtec not helping. Flu shot on 10/20, Rash appeared on 10/23.  Recently traveled to [place] and went to ED in [place] for bacterial Gastroenteritis.

## 2013-12-27 NOTE — Discharge Instructions (Signed)
Shingles Follow-up with Dr. Maureen Ralphs at 2 PM today. Take the viral medication as prescribed.  Follow-up with Dr. Erik Obey if she develops any problems with hearing, ear pain, problems with taste, weakness in the face, numbness, tingling, dizziness. Return to the ED if you develop new or worsening symptoms. Shingles (herpes zoster) is an infection that is caused by the same virus that causes chickenpox (varicella). The infection causes a painful skin rash and fluid-filled blisters, which eventually break open, crust over, and heal. It may occur in any area of the body, but it usually affects only one side of the body or face. The pain of shingles usually lasts about 1 month. However, some people with shingles may develop long-term (chronic) pain in the affected area of the body. Shingles often occurs many years after the person had chickenpox. It is more common:  In people older than 50 years.  In people with weakened immune systems, such as those with HIV, AIDS, or cancer.  In people taking medicines that weaken the immune system, such as transplant medicines.  In people under great stress. CAUSES  Shingles is caused by the varicella zoster virus (VZV), which also causes chickenpox. After a person is infected with the virus, it can remain in the person's body for years in an inactive state (dormant). To cause shingles, the virus reactivates and breaks out as an infection in a nerve root. The virus can be spread from person to person (contagious) through contact with open blisters of the shingles rash. It will only spread to people who have not had chickenpox. When these people are exposed to the virus, they may develop chickenpox. They will not develop shingles. Once the blisters scab over, the person is no longer contagious and cannot spread the virus to others. SIGNS AND SYMPTOMS  Shingles shows up in stages. The initial symptoms may be pain, itching, and tingling in an area of the skin. This pain is  usually described as burning, stabbing, or throbbing.In a few days or weeks, a painful red rash will appear in the area where the pain, itching, and tingling were felt. The rash is usually on one side of the body in a band or belt-like pattern. Then, the rash usually turns into fluid-filled blisters. They will scab over and dry up in approximately 2-3 weeks. Flu-like symptoms may also occur with the initial symptoms, the rash, or the blisters. These may include:  Fever.  Chills.  Headache.  Upset stomach. DIAGNOSIS  Your health care provider will perform a skin exam to diagnose shingles. Skin scrapings or fluid samples may also be taken from the blisters. This sample will be examined under a microscope or sent to a lab for further testing. TREATMENT  There is no specific cure for shingles. Your health care provider will likely prescribe medicines to help you manage the pain, recover faster, and avoid long-term problems. This may include antiviral drugs, anti-inflammatory drugs, and pain medicines. HOME CARE INSTRUCTIONS   Take a cool bath or apply cool compresses to the area of the rash or blisters as directed. This may help with the pain and itching.   Take medicines only as directed by your health care provider.   Rest as directed by your health care provider.  Keep your rash and blisters clean with mild soap and cool water or as directed by your health care provider.  Do not pick your blisters or scratch your rash. Apply an anti-itch cream or numbing creams to the affected area  as directed by your health care provider.  Keep your shingles rash covered with a loose bandage (dressing).  Avoid skin contact with:  Babies.   Pregnant women.   Children with eczema.   Elderly people with transplants.   People with chronic illnesses, such as leukemia or AIDS.   Wear loose-fitting clothing to help ease the pain of material rubbing against the rash.  Keep all follow-up  visits as directed by your health care provider.If the area involved is on your face, you may receive a referral for a specialist, such as an eye doctor (ophthalmologist) or an ear, nose, and throat (ENT) doctor. Keeping all follow-up visits will help you avoid eye problems, chronic pain, or disability.  SEEK IMMEDIATE MEDICAL CARE IF:   You have facial pain, pain around the eye area, or loss of feeling on one side of your face.  You have ear pain or ringing in your ear.  You have loss of taste.  Your pain is not relieved with prescribed medicines.   Your redness or swelling spreads.   You have more pain and swelling.  Your condition is worsening or has changed.   You have a fever. MAKE SURE YOU:  Understand these instructions.  Will watch your condition.  Will get help right away if you are not doing well or get worse. Document Released: 01/31/2005 Document Revised: 06/17/2013 Document Reviewed: 09/15/2011 [facility] Patient Information 2015 [place], [place]. This information is not intended to replace advice given to you by your health care provider. Make sure you discuss any questions you have with your health care provider.

## 2013-12-27 NOTE — ED Provider Notes (Signed)
CSN: [id number]     Arrival date & time 12/27/13  1010 History  This chart was scribed for Kelsey Essex, MD by Ludger Nutting, ED Scribe. This patient was seen in room MH10/MH10 and the patient's care was started 11:10 AM.    Chief Complaint  Patient presents with  . Rash   The history is provided by the patient. No language interpreter was used.     HPI Comments: Kelsey Church is a 63 y.o. female who presents to the Emergency Department complaining of 3 weeks of gradual onset, constant, gradually worsened itchy rash to the torso, LUE, and behind the left ear. She states the left ear region has become painful since yesterday. She reports a history of shingles in the past and states certain areas on her body feel similar. Patient states she traveled to [place] at the beginning of October and had bacterial gastroenteritis. She states she was prescribed Cipro and no longer has any GI concerns. Patient states she had a left total knee replacement on 12/16/13 by Dr. Wynelle Link and has been taking Xarelto, Robaxin, and oxycodone since the surgery. She denies chest pain, SOB, extremity weakness or numbness, dizziness, lightheadedness.   Past Medical History  Diagnosis Date  . Melanoma in situ   . Hypertension   . Sleep apnea 09/2011    CPAP  . Sleep apnea   . Depression   . Complication of anesthesia     "chills every evening at sundown x 2 weeks"- no fever  . 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 History of shingles     NO RESIDUAL PROBLEMS  . Pain     LOWER BACK PAIN   Past Surgical History  Procedure Laterality Date  . Appendectomy  1958  . Gallbladder surgery  02/14/1990  . Salpingoophorectomy  2009    Right  . Knee arthroscopy  1996  . Injection knee  09/2011    right   . Colonoscopy w/ polypectomy      7 polyps  . Melanoma excision  1992    right chin, back  . Foot surgery Right     removal plantar wart  . Total knee arthroplasty  Right 06/10/2013    Procedure: RIGHT TOTAL KNEE ARTHROPLASTY;  Surgeon: [name], MD;  Location: [facility];  Service: Orthopedics;  Laterality: Right;  . Total knee arthroplasty Left 12/16/2013    Procedure: LEFT TOTAL KNEE ARTHROPLASTY;  Surgeon: [name], MD;  Location: [facility];  Service: Orthopedics;  Laterality: Left;   Family History  Problem Relation Age of Onset  . Heart disease Father   . Cancer Maternal Grandmother     Breast and Uterine Cancer  . Osteoporosis Mother   . Hypertension Sister   . Diabetes Sister   . Colon cancer Neg Hx   . Rectal cancer Neg Hx   . Stomach cancer Neg Hx    History  Substance Use Topics  . Smoking status: Never Smoker   . Smokeless tobacco: Never Used  . Alcohol Use: No   OB History    No data available     Review of Systems  A complete 10 system review of systems was obtained and all systems are negative except as noted in the HPI and PMH.    Allergies  Review of patient's allergies indicates no known allergies.  Home Medications   Prior to Admission medications   Medication Sig Start  Date End Date Taking? Authorizing Provider  acetaminophen (TYLENOL) 650 MG CR tablet Take 650 mg by mouth every 8 (eight) hours as needed for pain.     Historical Provider, MD  cetirizine (ZYRTEC) 10 MG tablet Take 10 mg by mouth daily as needed for allergies.    Historical Provider, MD  citalopram (CELEXA) 20 MG tablet Take 20 mg by mouth at bedtime.     Historical Provider, MD  hydrOXYzine (ATARAX/VISTARIL) 25 MG tablet Take 1 tablet (25 mg total) by mouth every 6 (six) hours. 12/27/13   Kelsey Essex, MD  losartan-hydrochlorothiazide (HYZAAR) 100-25 MG per tablet Take 1 tablet by mouth every morning.    Historical Provider, MD  losartan-hydrochlorothiazide (HYZAAR) 100-25 MG per tablet TAKE ONE TABLET BY MOUTH ONE TIME DAILY  12/12/13   Janith Lima, MD  methocarbamol (ROBAXIN) 500 MG tablet Take 1 tablet (500 mg total) by mouth every 6  (six) hours as needed for muscle spasms. 12/17/13   Arlee Muslim, PA-C  oxyCODONE (OXY IR/ROXICODONE) 5 MG immediate release tablet Take 1-2 tablets (5-10 mg total) by mouth every 3 (three) hours as needed for moderate pain, severe pain or breakthrough pain. 12/17/13   Arlee Muslim, PA-C  rivaroxaban (XARELTO) 10 MG TABS tablet Take 1 tablet (10 mg total) by mouth daily with breakfast. Take Xarelto for two and a half more weeks, then discontinue Xarelto. Once the patient has completed the blood thinner regimen, then take a Baby 81 mg Aspirin daily for three more weeks. 12/17/13   Arlee Muslim, PA-C  traMADol (ULTRAM) 50 MG tablet Take 1 tablet (50 mg total) by mouth every 6 (six) hours as needed (mild to moderate). 06/12/13   Arlee Muslim, PA-C  valACYclovir (VALTREX) 1000 MG tablet Take 1 tablet (1,000 mg total) by mouth 3 (three) times daily. 12/27/13 01/06/14  Kelsey Essex, MD   BP 121/72 mmHg  Pulse 86  Temp(Src) 98.2 F (36.8 C) (Oral)  Resp 18  SpO2 96% Physical Exam  Constitutional: She is oriented to person, place, and time. She appears well-developed and well-nourished. No distress.  HENT:  Head: Normocephalic and atraumatic.  Mouth/Throat: Oropharynx is clear and moist. No oropharyngeal exudate.  No lesion on tip of nose. No oral lesions  No lesions in ear canal  Eyes: Conjunctivae and EOM are normal. Pupils are equal, round, and reactive to light.  Neck: Normal range of motion. Neck supple.  No meningismus.  Cardiovascular: Normal rate, regular rhythm, normal heart sounds and intact distal pulses.   No murmur heard. Pulmonary/Chest: Effort normal and breath sounds normal. No respiratory distress.  Abdominal: Soft. There is no tenderness. There is no rebound and no guarding.  Musculoskeletal: Normal range of motion. She exhibits no edema or tenderness.  Left knee incision is healing appropriately, mild surrounding erythema and edema.  Slightly decreased ROM. LLE swelling.  Intact DP and PT pulses.    Neurological: She is alert and oriented to person, place, and time. No cranial nerve deficit. She exhibits normal muscle tone. Coordination normal.  No ataxia on finger to nose bilaterally. No pronator drift. 5/5 strength throughout. CN 2-12 intact. Negative Romberg. Equal grip strength. Sensation intact. Gait is normal.   Skin: Skin is warm. Rash noted. Rash is vesicular. There is erythema.  Erythematous, tender, vesicular lesions posterior to left ear involving pinna of left ear. Vesicular lesions to left pinna, left ear lobe, left neck. Fine erythema to left upper back with excoriation.  No lesions in ear canal  Psychiatric: She has a normal mood and affect. Her behavior is normal.  Nursing note and vitals reviewed.     ED Course  Procedures (including critical care time)  DIAGNOSTIC STUDIES: Oxygen Saturation is 98% on RA, normal by my interpretation.    COORDINATION OF CARE: 11:19 AM Will order lab work. Discussed treatment plan with pt at bedside and pt agreed to plan.  12:20 PM Patient will follow up with Dr. Wynelle Link today.     Labs Review Labs Reviewed  CBC WITH DIFFERENTIAL - Abnormal; Notable for the following:    RBC 3.72 (*)    Hemoglobin 10.7 (*)    HCT 34.2 (*)    Platelets 403 (*)    All other components within normal limits  COMPREHENSIVE METABOLIC PANEL - Abnormal; Notable for the following:    Glucose, Bld 100 (*)    Albumin 3.3 (*)    GFR calc non Af Amer 67 (*)    GFR calc Af Amer 77 (*)    All other components within normal limits  PROTIME-INR    Imaging Review No results found.   EKG Interpretation None      MDM   Final diagnoses:  Herpes zoster  Two-week history of prutitic painful rash to left ear, posterior scalp and neck. No fever. No chest pain or shortness of breath. Knee replacement surgery on November 2. On xarelto.   Rash appears to be consistent with herpes zoster. Does not involve eye or tip of  nose. No rash inside the ear canal. Risk of Ramsay Hunt syndrome discussed with Dr. Erik Obey. He feels patient can be treated as peripheral herpes zoster with antivirals. Patient has no difficulty with hearing, facial paralysis, facial weakness. She does not appear to have Ramsay Hunt syndrome at this time.  Mild splotchy erythema with swelling to left knee status post knee replacement 10 days ago. No fever. Range of motion limited but intact. D/w Dr. Maureen Ralphs. No evidence of septic joint. He wishes to see patient in clinic today at 2 PM.  Start antivirals, antihistamines.  D/w patient and family diagnosis of zoster and need to return with any hearing or eye problems or any facial weakness. She will need to see Dr. Erik Obey if she develops any problem with hearing or facial weakness.   I personally performed the services described in this documentation, which was scribed in my presence. The recorded information has been reviewed and is accurate.   Kelsey Essex, MD 12/27/13 [phone]

## 2013-12-27 NOTE — ED Notes (Signed)
A rash is noted over the left mastoid process. Left ear is swollen. Reports pain and tenderness over left ear and left mastoid.

## 2014-01-07 ENCOUNTER — Ambulatory Visit: Payer: BC Managed Care – PPO | Attending: Orthopedic Surgery | Admitting: Rehabilitation

## 2014-01-07 DIAGNOSIS — Z96659 Presence of unspecified artificial knee joint: Secondary | ICD-10-CM | POA: Insufficient documentation

## 2014-01-07 DIAGNOSIS — M25569 Pain in unspecified knee: Secondary | ICD-10-CM | POA: Insufficient documentation

## 2014-01-14 ENCOUNTER — Ambulatory Visit: Payer: BC Managed Care – PPO | Attending: Orthopedic Surgery | Admitting: Rehabilitation

## 2014-01-14 DIAGNOSIS — Z96659 Presence of unspecified artificial knee joint: Secondary | ICD-10-CM | POA: Diagnosis not present

## 2014-01-14 DIAGNOSIS — M25569 Pain in unspecified knee: Secondary | ICD-10-CM | POA: Insufficient documentation

## 2014-01-15 ENCOUNTER — Ambulatory Visit: Payer: BC Managed Care – PPO | Admitting: Rehabilitation

## 2014-01-15 DIAGNOSIS — M25569 Pain in unspecified knee: Secondary | ICD-10-CM | POA: Diagnosis not present

## 2014-01-16 ENCOUNTER — Ambulatory Visit: Payer: BC Managed Care – PPO | Admitting: Rehabilitation

## 2014-01-20 ENCOUNTER — Ambulatory Visit: Payer: BC Managed Care – PPO | Admitting: Rehabilitation

## 2014-01-20 DIAGNOSIS — M25569 Pain in unspecified knee: Secondary | ICD-10-CM | POA: Diagnosis not present

## 2014-01-22 ENCOUNTER — Ambulatory Visit: Payer: BC Managed Care – PPO | Admitting: Rehabilitation

## 2014-01-23 ENCOUNTER — Ambulatory Visit: Payer: BC Managed Care – PPO | Admitting: Rehabilitation

## 2014-01-23 DIAGNOSIS — M25569 Pain in unspecified knee: Secondary | ICD-10-CM | POA: Diagnosis not present

## 2014-01-27 ENCOUNTER — Ambulatory Visit: Payer: BC Managed Care – PPO | Admitting: Rehabilitation

## 2014-01-28 ENCOUNTER — Other Ambulatory Visit: Payer: Self-pay | Admitting: Internal Medicine

## 2014-01-29 ENCOUNTER — Ambulatory Visit: Payer: BC Managed Care – PPO | Admitting: Rehabilitation

## 2014-01-29 DIAGNOSIS — M25569 Pain in unspecified knee: Secondary | ICD-10-CM | POA: Diagnosis not present

## 2014-01-31 ENCOUNTER — Ambulatory Visit: Payer: BC Managed Care – PPO | Admitting: Rehabilitation

## 2014-01-31 DIAGNOSIS — M25569 Pain in unspecified knee: Secondary | ICD-10-CM | POA: Diagnosis not present

## 2014-02-03 ENCOUNTER — Ambulatory Visit: Payer: BC Managed Care – PPO | Admitting: Rehabilitation

## 2014-02-04 ENCOUNTER — Ambulatory Visit: Payer: BC Managed Care – PPO | Admitting: Rehabilitation

## 2014-02-04 DIAGNOSIS — M25569 Pain in unspecified knee: Secondary | ICD-10-CM | POA: Diagnosis not present

## 2014-02-05 ENCOUNTER — Ambulatory Visit: Payer: BC Managed Care – PPO | Admitting: Rehabilitation

## 2014-03-04 ENCOUNTER — Ambulatory Visit (INDEPENDENT_AMBULATORY_CARE_PROVIDER_SITE_OTHER): Payer: BLUE CROSS/BLUE SHIELD | Admitting: Internal Medicine

## 2014-03-04 ENCOUNTER — Encounter: Payer: Self-pay | Admitting: Internal Medicine

## 2014-03-04 VITALS — BP 126/70 | HR 86 | Temp 98.0°F | Resp 16 | Ht 66.0 in | Wt 184.0 lb

## 2014-03-04 DIAGNOSIS — A46 Erysipelas: Secondary | ICD-10-CM

## 2014-03-04 MED ORDER — AMOXICILLIN-POT CLAVULANATE 875-125 MG PO TABS: 1.0000 | ORAL_TABLET | Freq: Two times a day (BID) | ORAL | Status: AC

## 2014-03-04 NOTE — Assessment & Plan Note (Signed)
She has a strep infection on the left side of her face Will treat with augmentin

## 2014-03-04 NOTE — Progress Notes (Signed)
Pre visit review using our clinic review tool, if applicable. No additional management support is needed unless otherwise documented below in the visit note. 

## 2014-03-04 NOTE — Patient Instructions (Signed)
Erysipelas Erysipelas is a sudden form of cellulitis (inflammation of the cells) that affects the tissues near the skin surface. It is most often caused by a streptococcal or staphylococcal (germ) infection. SYMPTOMS Erysipelas begins as just not feeling well (malaise), chills, and a fever of usually 101 F (38.3 C) to 104 F (40 C). Being it is an inflammation (soreness) of the skin and the tissue just beneath the skin; it shows up as a reddened area with sharp borders. It may be streaked because the lymphatics are infected. These are lymph channels that flow out of your glands (lymph nodes), like the glands in your neck. The reddened area may be tender to touch with itching and burning of the skin. Sometimes this is accompanied by feelings of nausea (you are sick to your stomach) and vomiting (throwing up). Sometimes there may be a break in the skin over the reddened area which is where the bacteria (germs) entered the body. Sometimes there may not appear to be a site of entry. The most common area for erysipelas to appear is on the lower legs. When the legs are infected, it is usually the glands (lymph nodes) in the groin that may be enlarged and tender. DIAGNOSIS  Your caregiver most often bases the diagnosis (learning what is wrong) on your physical findings (examination). It is often hard to grow the germs that produce this illness. Sometimes blood cultures (to see what germs may be growing in your blood) will be done if there is a high fever and the blood cultures are likely to be positive. This means the culture is able to grow the bacteria (germ) producing the erysipelas. If blood counts are done, the white blood count is usually elevated. The ESR (erythrocyte sedimentation rate) is also usually elevated (higher than normal). The ESR is just a nonspecific sign of infection being present. TREATMENT  This infection usually responds rapidly to medications which kill germs (antibiotics). Depending on  findings and course of the illness (gets better or worse), your caregiver will be able to decide which is the best possible treatment for you. Most often these infections respond well to penicillin in individuals not allergic to penicillin. Other alternatives are available for those who cannot take penicillin. HOME CARE INSTRUCTIONS   You may return to work as directed.  Only take over-the-counter or prescription medicines for pain, discomfort, or fever as directed by your caregiver.  Finish all antibiotics as prescribed by your caregiver even if it looks as if the infection has cleared completely. SEEK MEDICAL CARE IF:   Your chills and feelings of illness are getting worse.  You have pain or discomfort not controlled by medications, or if symptoms seem to be getting worse rather than better.  The reddened area of infection seems to be spreading rather than getting smaller, red lines are extending away from the infection toward your chest or abdomen, or a part of the infection begins to turn dark in color.  The problem returns in the same or another area after it seems to have gone away. MAKE SURE YOU:   Understand these instructions.  Will watch your condition.  Will get help right away if you are not doing well or get worse. Document Released: 10/26/2000 Document Revised: 04/25/2011 Document Reviewed: 09/19/2007 [facility] Patient Information 2015 [place], [place]. This information is not intended to replace advice given to you by your health care provider. Make sure you discuss any questions you have with your health care provider.

## 2014-03-04 NOTE — Progress Notes (Signed)
Subjective:    Patient ID: Kelsey Church, female    DOB: Aug 09, 1950, 64 y.o.   MRN: [id number]  Rash This is a new problem. The current episode started in the past 7 days. The problem has been gradually worsening since onset. The affected locations include the face. The rash is characterized by pain. She was exposed to nothing. Associated symptoms include facial edema, fatigue, a fever and a sore throat. Pertinent negatives include no anorexia, congestion, cough, diarrhea, eye pain, joint pain, nail changes, rhinorrhea, shortness of breath or vomiting. Past treatments include nothing. The treatment provided no relief.      Review of Systems  Constitutional: Positive for fever, chills and fatigue. Negative for diaphoresis, activity change and appetite change.  HENT: Positive for sore throat. Negative for congestion, rhinorrhea and sinus pressure.   Eyes: Negative.  Negative for pain.  Respiratory: Negative.  Negative for cough, choking, chest tightness, shortness of breath and stridor.   Cardiovascular: Negative.  Negative for chest pain, palpitations and leg swelling.  Gastrointestinal: Negative.  Negative for vomiting, abdominal pain, diarrhea and anorexia.  Endocrine: Negative.   Genitourinary: Negative.   Musculoskeletal: Negative.  Negative for joint pain.  Skin: Positive for rash. Negative for nail changes, color change, pallor and wound.  Allergic/Immunologic: Negative.   Neurological: Negative.   Hematological: Negative.   Psychiatric/Behavioral: Negative.        Objective:   Physical Exam  Constitutional: She is oriented to person, place, and time. She appears well-developed and well-nourished.  Non-toxic appearance. She does not have a sickly appearance. She does not appear ill. No distress.  HENT:  Head:    Mouth/Throat: Oropharynx is clear and moist. No oropharyngeal exudate.  Eyes: Conjunctivae are normal. Right eye exhibits no discharge. Left eye exhibits no  discharge. No scleral icterus.  Neck: Normal range of motion. Neck supple. No JVD present. No tracheal deviation present. No thyromegaly present.  Cardiovascular: Normal rate, regular rhythm, normal heart sounds and intact distal pulses.  Exam reveals no gallop and no friction rub.   No murmur heard. Pulmonary/Chest: Effort normal and breath sounds normal. No stridor. No respiratory distress. She has no wheezes. She has no rales. She exhibits no tenderness.  Abdominal: Soft. Bowel sounds are normal. She exhibits no distension and no mass. There is no tenderness. There is no rebound and no guarding.  Musculoskeletal: Normal range of motion. She exhibits no edema or tenderness.  Lymphadenopathy:    She has no cervical adenopathy.  Neurological: She is oriented to person, place, and time.  Skin: Skin is warm and dry. Rash noted. No purpura noted. Rash is macular. Rash is not papular, not maculopapular, not nodular, not pustular, not vesicular and not urticarial. She is not diaphoretic. There is erythema. No pallor.  Psychiatric: She has a normal mood and affect. Her behavior is normal. Judgment and thought content normal.  Vitals reviewed.   Lab Results  Component Value Date   WBC 9.8 12/27/2013   HGB 10.7* 12/27/2013   HCT 34.2* 12/27/2013   PLT 403* 12/27/2013   GLUCOSE 100* 12/27/2013   CHOL 196 12/03/2013   TRIG 116.0 12/03/2013   HDL 62.70 12/03/2013   LDLCALC 110* 12/03/2013   ALT 19 12/27/2013   AST 18 12/27/2013   NA 138 12/27/2013   K 3.9 12/27/2013   CL 97 12/27/2013   CREATININE 0.90 12/27/2013   BUN 19 12/27/2013   CO2 30 12/27/2013   TSH 1.40 12/03/2013   INR  1.17 12/27/2013   HGBA1C 6.1 12/03/2013        Assessment & Plan:

## 2014-03-17 ENCOUNTER — Telehealth: Payer: Self-pay | Admitting: Internal Medicine

## 2014-03-17 MED ORDER — AZITHROMYCIN 500 MG PO TABS: 500.0000 mg | ORAL_TABLET | Freq: Every day | ORAL | Status: DC

## 2014-03-17 NOTE — Telephone Encounter (Signed)
done

## 2014-03-17 NOTE — Telephone Encounter (Signed)
Pt called stated that Dr. Ronnald Ramp put her on Augmentin for her face but now she is experiencing sinus with thick green mucus. Pt was wondering if Dr. Ronnald Ramp can cell in Z-pak to [facility]. Please call pt

## 2014-03-28 ENCOUNTER — Encounter: Payer: Self-pay | Admitting: Family Medicine

## 2014-03-28 ENCOUNTER — Other Ambulatory Visit (INDEPENDENT_AMBULATORY_CARE_PROVIDER_SITE_OTHER): Payer: BLUE CROSS/BLUE SHIELD

## 2014-03-28 ENCOUNTER — Ambulatory Visit (INDEPENDENT_AMBULATORY_CARE_PROVIDER_SITE_OTHER): Payer: BLUE CROSS/BLUE SHIELD | Admitting: Family Medicine

## 2014-03-28 ENCOUNTER — Telehealth: Payer: Self-pay | Admitting: Family Medicine

## 2014-03-28 VITALS — BP 112/70 | HR 77 | Ht 66.0 in | Wt 184.0 lb

## 2014-03-28 DIAGNOSIS — M25511 Pain in right shoulder: Secondary | ICD-10-CM

## 2014-03-28 DIAGNOSIS — M7551 Bursitis of right shoulder: Secondary | ICD-10-CM

## 2014-03-28 NOTE — Telephone Encounter (Signed)
Spoke to pt, she is coming in today @ 12:45p

## 2014-03-28 NOTE — Patient Instructions (Signed)
Good to see you Ice is your friend Ice 20 minutes 2 times daily. Usually after activity and before bed.  Try the pennsaid twice daily.  Exercises 3 times a day See me again in 3-4 weeks if not perfect/.

## 2014-03-28 NOTE — Progress Notes (Signed)
Pre visit review using our clinic review tool, if applicable. No additional management support is needed unless otherwise documented below in the visit note. 

## 2014-03-28 NOTE — Assessment & Plan Note (Signed)
Patient was given a repeat injection today. We discussed icing regimen. Patient to trial of topical anti-inflammatories. Patient encouraged to do the home exercises and the icing on a more regular basis. Patient will follow-up with me again in 3-4 weeks for further evaluation and treatment.

## 2014-03-28 NOTE — Progress Notes (Signed)
[facility] [address] [place], [place] [address] Phone: [phone] Subjective:    CC: Right shoulder pain follow up  [id number] Kelsey Church is a 64 y.o. female coming in with complaint of right shoulder pain. Patient sent of the subacromial and her sinus as well as mild osteophytic changes and rotator cuff tendinopathy. Patient was last given an injection of the shoulder in 11/12/2013. Patient was doing very well. Patient for clean did fall proximal [place] 1 month ago. Patient states she is having a dull throbbing aching pain in the shoulder that was very similar to what it was previously. Patient denies any weakness. States that the pain can wake her up at night. Patient has tried over-the-counter anti-inflammatories and topical antifungal is a minimal benefit. Starting to affect her daily activities.      Past medical history, social, surgical and family history all reviewed in electronic medical record.   Review of Systems: No headache, visual changes, nausea, vomiting, diarrhea, constipation, dizziness, abdominal pain, skin rash, fevers, chills, night sweats, weight loss, swollen lymph nodes, body aches, joint swelling, muscle aches, chest pain, shortness of breath, mood changes.   Objective There were no vitals taken for this visit.  General: No apparent distress alert and oriented x3 mood and affect normal, dressed appropriately.  HEENT: Pupils equal, extraocular movements intact  Respiratory: Patient's speak in full sentences and does not appear short of breath  Cardiovascular: No lower extremity edema, non tender, no erythema  Skin: Warm dry intact with no signs of infection or rash on extremities or on axial skeleton.  Abdomen: Soft nontender  Neuro: Cranial nerves II through XII are intact, neurovascularly intact in all extremities with 2+ DTRs and 2+ pulses.  Lymph: No lymphadenopathy of posterior or anterior cervical chain or axillae  bilaterally.  Gait normal with good balance and coordination.  MSK:  Non tender with full range of motion and good stability and symmetric strength and tone of  elbows, wrist, hip, knee and ankles bilaterally.  Shoulder: Right Inspection reveals no abnormalities, atrophy or asymmetry. Palpation is normal with no tenderness over AC joint or bicipital groove. ROM is full in all planes passively. Rotator cuff strength normal throughout. Mild signs of impingement but significant improvement. Speeds and Yergason's tests normal. No labral pathology noted with negative Obrien's, negative clunk and good stability. Normal scapular function observed. No painful arc and no drop arm sign. No apprehension sign Contralateral shoulder unremarkable.   MSK US performed of: Right  This study was ordered, performed, and interpreted by Charlann Boxer D.O.  Shoulder:  Supraspinatus positive bursa seen but patient does have some mild degenerative changes of the rotator cuff. Subscapularis: Appears normal on long and transverse views.positive bursa noted Teres Minor: Appears normal on long and transverse views.  AC joint: Capsule undistended, no geyser sign.  Glenohumeral Joint: Appears normal without effusion. Mild to moderate joint arthritis noted Glenoid Labrum: Intact without visualized tears.  Biceps Tendon: Appears normal on long and transverse views, no fraying of tendon, tendon located in intertubercular groove, no subluxation with shoulder internal or external rotation.  Impression: subacromial bursitis  Procedure: Real-time Ultrasound Guided Injection of right glenohumeral joint Device: GE Logiq E  Ultrasound guided injection is preferred based studies that show increased duration, increased effect, greater accuracy, decreased procedural pain, increased response rate with ultrasound guided versus blind injection.  Verbal informed consent obtained.  Time-out conducted.  Noted no overlying erythema,  induration, or other signs  of local infection.  Skin prepped in a sterile fashion.  Local anesthesia: Topical Ethyl chloride.  With sterile technique and under real time ultrasound guidance:  Joint visualized.  23g 1  inch needle inserted posterior approach. Pictures taken for needle placement. Patient did have injection of 2 cc of 1% lidocaine, 2 cc of 0.5% Marcaine, and 1.0 cc of Kenalog 40 mg/dL. Completed without difficulty  Pain immediately resolved suggesting accurate placement of the medication.  Advised to call if fevers/chills, erythema, induration, drainage, or persistent bleeding.  Images permanently stored and available for review in the ultrasound unit.  Impression: Technically successful ultrasound guided injection.    Impression and Recommendations:     This case required medical decision making of moderate complexity.

## 2014-03-28 NOTE — Telephone Encounter (Signed)
Would like to be seen today for shoulder pain.

## 2014-04-01 ENCOUNTER — Other Ambulatory Visit: Payer: Self-pay | Admitting: Internal Medicine

## 2014-06-03 ENCOUNTER — Ambulatory Visit (INDEPENDENT_AMBULATORY_CARE_PROVIDER_SITE_OTHER): Payer: BLUE CROSS/BLUE SHIELD | Admitting: Physician Assistant

## 2014-06-03 ENCOUNTER — Encounter: Payer: Self-pay | Admitting: Physician Assistant

## 2014-06-03 VITALS — BP 129/80 | HR 82 | Temp 98.2°F | Resp 16 | Ht 66.0 in | Wt 185.5 lb

## 2014-06-03 DIAGNOSIS — R21 Rash and other nonspecific skin eruption: Secondary | ICD-10-CM

## 2014-06-03 MED ORDER — PREDNISONE 10 MG PO TABS: ORAL_TABLET | ORAL | Status: DC

## 2014-06-03 NOTE — Progress Notes (Signed)
Pre visit review using our clinic review tool, if applicable. No additional management support is needed unless otherwise documented below in the visit note/SLS  

## 2014-06-03 NOTE — Patient Instructions (Signed)
Please take prednisone taper as directed. Avoid direct sunlight when possible. Continue Cerave lotion but also consider Sarna lotion to relieve itch. Take a nightly Benadryl to help with itch. Cool compresses may also be beneficial.  If not improving, we will need lab workup and Dermatology input.

## 2014-06-03 NOTE — Progress Notes (Signed)
Patient presents to clinic today c/o burning and itchy rash over body x 2 weeks.  Endorses first noted on feet and shins, but now has moved to entire body. Denies recent change in lotions, detergents or hygiene products. Denies exposure to rhus plants.  Denies fever, chills, sweats or N/V.  Has used Cerave lotion with minimal relief in itching.  Patient endorses that symptoms are worse in the morning and improve throughout the day.   Past Medical History  Diagnosis Date  . Melanoma in situ   . Hypertension   . Sleep apnea 09/2011    CPAP  . Sleep apnea   . Depression   . Complication of anesthesia     "chills every evening at sundown x 2 weeks"- no fever  . 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 History of shingles     NO RESIDUAL PROBLEMS  . Pain     LOWER BACK PAIN    Current Outpatient Prescriptions on File Prior to Visit  Medication Sig Dispense Refill  . citalopram (CELEXA) 20 MG tablet Take 20 mg by mouth at bedtime.     Marland Kitchen losartan-hydrochlorothiazide (HYZAAR) 100-25 MG per tablet TAKE ONE TABLET BY MOUTH ONE TIME DAILY  90 tablet 3  . meloxicam (MOBIC) 15 MG tablet TAKE ONE TABLET BY MOUTH ONE TIME DAILY  90 tablet 2  . PREMPRO 0.3-1.5 MG per tablet   3  . traMADol (ULTRAM) 50 MG tablet TAKE ONE TABLET BY MOUTH EVERY EIGHT HOURS AS NEEDED FOR PAIN  65 tablet 3   No current facility-administered medications on file prior to visit.    No Known Allergies  Family History  Problem Relation Age of Onset  . Heart disease Father   . Cancer Maternal Grandmother     Breast and Uterine Cancer  . Osteoporosis Mother   . Hypertension Sister   . Diabetes Sister   . Colon cancer Neg Hx   . Rectal cancer Neg Hx   . Stomach cancer Neg Hx     History   Social History  . Marital Status: Widowed    Spouse Name: N/A  . Number of Children: 1  . Years of Education: N/A   Occupational History  . [employer]   Social History  Main Topics  . Smoking status: Never Smoker   . Smokeless tobacco: Never Used  . Alcohol Use: No  . Drug Use: No  . Sexual Activity: Not Currently   Other Topics Concern  . None   Social History Narrative   Caffienated drinks-no   Seat belt use often-yes   Regular Exercise-yes   Smoke alarm in the home-yes   Firearms/guns in the home-no   History of physical abuse-no               Review of Systems - See HPI.  All other ROS are negative.  BP 129/80 mmHg  Pulse 82  Temp(Src) 98.2 F (36.8 C) (Oral)  Resp 16  Ht 5\' 6"  (1.676 m)  Wt 185 lb 8 oz (84.142 kg)  BMI 29.95 kg/m2  SpO2 98%  Physical Exam  Constitutional: She is oriented to person, place, and time and well-developed, well-nourished, and in no distress.  HENT:  Head: Normocephalic and atraumatic.  Eyes: Conjunctivae are normal. Pupils are equal, round, and reactive to light.  Neck: Neck supple.  Cardiovascular: Normal rate, regular rhythm, normal heart sounds and intact distal  pulses.   Pulmonary/Chest: Effort normal and breath sounds normal. No respiratory distress. She has no wheezes. She has no rales. She exhibits no tenderness.  Lymphadenopathy:    She has no cervical adenopathy.  Neurological: She is alert and oriented to person, place, and time.  Skin: Skin is warm.  Scattered erythematous macular rash of light-touching regions.  Spares area underneath clothing.  Psychiatric: Affect normal.  Vitals reviewed.  Assessment/Plan: Rash and nonspecific skin eruption Unclear etiology although almost suspect photodermatitis. Rx Prednisone 12-day taper.  Supportive measures and itch relief discussed.  IF not improving, recommend derm assessment.

## 2014-06-03 NOTE — Assessment & Plan Note (Signed)
Unclear etiology although almost suspect photodermatitis. Rx Prednisone 12-day taper.  Supportive measures and itch relief discussed.  IF not improving, recommend derm assessment.

## 2014-06-20 ENCOUNTER — Ambulatory Visit (INDEPENDENT_AMBULATORY_CARE_PROVIDER_SITE_OTHER): Payer: BLUE CROSS/BLUE SHIELD | Admitting: Internal Medicine

## 2014-06-20 DIAGNOSIS — Z9189 Other specified personal risk factors, not elsewhere classified: Secondary | ICD-10-CM

## 2014-06-20 MED ORDER — CIPROFLOXACIN HCL 500 MG PO TABS: 500.0000 mg | ORAL_TABLET | Freq: Two times a day (BID) | ORAL | Status: DC

## 2014-06-20 NOTE — Progress Notes (Signed)
   Subjective:    Patient ID: Kelsey Church, female    DOB: [birth year], 64 y.o.   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 She has previously traveled to [place], [place], [place], [place]. She has been in good state of health except she has a rash eruption to arms and legs, in sun exposed areas. Unclear if it  is resolving quickly, she states that it has persisted x 3 wk, improved somewhat when she was on steroids. She is unclear what is causing it.    Prior imms:  Extensive travel vaccines in 2014, including typhoid.  Current Outpatient Prescriptions on File Prior to Visit  Medication Sig Dispense Refill  . citalopram (CELEXA) 20 MG tablet Take 20 mg by mouth at bedtime.     Marland Kitchen losartan-hydrochlorothiazide (HYZAAR) 100-25 MG per tablet TAKE ONE TABLET BY MOUTH ONE TIME DAILY  90 tablet 3  . meloxicam (MOBIC) 15 MG tablet TAKE ONE TABLET BY MOUTH ONE TIME DAILY  90 tablet 2  . predniSONE (DELTASONE) 10 MG tablet Take 4 tablets by mouth x 3 days. Then take 3 tablets x 3 days.  Then 2 tablets x 3 days.  Then 1 tablet x 3 days. 30 tablet 0  . PREMPRO 0.3-1.5 MG per tablet   3  . traMADol (ULTRAM) 50 MG tablet TAKE ONE TABLET BY MOUTH EVERY EIGHT HOURS AS NEEDED FOR PAIN  65 tablet 3   No current facility-administered medications on file prior to visit.   Active Ambulatory Problems    Diagnosis Date Noted  . OSA (obstructive sleep apnea) 07/29/2011  . Essential hypertension, benign 07/29/2011  . Pure hypercholesterolemia 07/29/2011  . Routine general medical examination at a health care facility 07/29/2011  . DJD (degenerative joint disease) of knee 09/09/2011  . Other screening mammogram 09/25/2012  . Shoulder bursitis 10/04/2013  . Hyperglycemia 12/03/2013  . OA (osteoarthritis) of knee 12/16/2013  . Erysipelas 03/04/2014  . Rash and nonspecific skin eruption 06/03/2014   Resolved Ambulatory  Problems    Diagnosis Date Noted  . DOE (dyspnea on exertion) 07/29/2011  . Knee pain, right 09/09/2011  . Foot injury 09/06/2012  . OA (osteoarthritis) of knee 06/10/2013  . Hyponatremia 06/11/2013  . Hypokalemia 06/11/2013  . Postoperative anemia due to acute blood loss 06/11/2013  . Cold sore 10/25/2013   Past Medical History  Diagnosis Date  . Melanoma in situ   . Hypertension   . Sleep apnea 09/2011  . Sleep apnea   . Depression   . Complication of anesthesia   . Arthritis   . History of shingles   . Pain       Review of Systems     Objective:   Physical Exam        Assessment & Plan:  Pre-travel counseling provided  Traveler's diarrhea = gave rx for cipro  Rash = appears to be consistent with allergic response, contact dermatitis appearance but unclear what she had been exposed to cause this exposure. If persist, would recommend to go back to pcp/dermatology

## 2014-07-07 ENCOUNTER — Other Ambulatory Visit: Payer: Self-pay | Admitting: Internal Medicine

## 2014-07-07 ENCOUNTER — Telehealth: Payer: Self-pay | Admitting: Internal Medicine

## 2014-07-07 DIAGNOSIS — I10 Essential (primary) hypertension: Secondary | ICD-10-CM

## 2014-07-07 NOTE — Telephone Encounter (Signed)
She is already on a diuretic Can;t think of what else to do

## 2014-07-07 NOTE — Telephone Encounter (Signed)
Tried calling pt no answer x's 10 rings.../lmb 

## 2014-07-07 NOTE — Telephone Encounter (Signed)
Pt called in and said that she just came back from mission trip and her fee and legs are swollen .  She did not wear her compression leggings she wanted to know if Dr Ronnald Ramp could call her in something without being seen?    Best number [phone]

## 2014-07-08 NOTE — Telephone Encounter (Signed)
Called pt again still no answer x's 10 rings not able to leave msg no vm...Kelsey Church

## 2014-07-09 NOTE — Telephone Encounter (Signed)
Tried calling pt several time no answer closing encounter...Kelsey Church

## 2014-07-10 ENCOUNTER — Other Ambulatory Visit: Payer: Self-pay

## 2014-07-10 MED ORDER — CITALOPRAM HYDROBROMIDE 20 MG PO TABS: 20.0000 mg | ORAL_TABLET | Freq: Every day | ORAL | Status: DC

## 2014-07-22 ENCOUNTER — Ambulatory Visit (INDEPENDENT_AMBULATORY_CARE_PROVIDER_SITE_OTHER): Payer: BLUE CROSS/BLUE SHIELD | Admitting: Family Medicine

## 2014-07-22 ENCOUNTER — Other Ambulatory Visit (INDEPENDENT_AMBULATORY_CARE_PROVIDER_SITE_OTHER): Payer: BLUE CROSS/BLUE SHIELD

## 2014-07-22 ENCOUNTER — Encounter: Payer: Self-pay | Admitting: Family Medicine

## 2014-07-22 VITALS — BP 110/70 | HR 69 | Wt 185.0 lb

## 2014-07-22 DIAGNOSIS — M7551 Bursitis of right shoulder: Secondary | ICD-10-CM

## 2014-07-22 DIAGNOSIS — M25511 Pain in right shoulder: Secondary | ICD-10-CM

## 2014-07-22 NOTE — Progress Notes (Signed)
[name] [address], [place] [address] Phone: [phone] Subjective:    CC: Right shoulder pain follow up  [id number] Kelsey Church is a 64 y.o. female coming in with complaint of right shoulder pain. Patient sent of the subacromial and her sinus as well as mild osteophytic changes and rotator cuff tendinopathy. Patient was last given an injection of the shoulder in February of this year.  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 Patient states since then she's had mild discomfort. Patient has been going to a chiropractor with no significant improvement. States that the pain is now waking her up at night. Describes the pain as a dull throbbing aching sensation. Denies any radiation down the arm or any numbness or tingling. States that the meloxicam that she has has not been helping and tramadol only makes her sleepy. Denies any weakness      Past medical history, social, surgical and family history all reviewed in electronic medical record.   Review of Systems: No headache, visual changes, nausea, vomiting, diarrhea, constipation, dizziness, abdominal pain, skin rash, fevers, chills, night sweats, weight loss, swollen lymph nodes, body aches, joint swelling, muscle aches, chest pain, shortness of breath, mood changes.   Objective Blood pressure 110/70, pulse 69, weight 185 lb (83.915 kg), SpO2 95 %.  General: No apparent distress alert and oriented x3 mood and affect normal, dressed appropriately.  HEENT: Pupils equal, extraocular movements intact  Respiratory: Patient's speak in full sentences and does not appear short of breath  Cardiovascular: No lower extremity edema, non tender, no erythema  Skin: Warm dry intact with no signs of infection or rash on extremities or on axial skeleton.  Abdomen: Soft nontender  Neuro: Cranial nerves II through XII are intact, neurovascularly intact in all extremities with 2+  DTRs and 2+ pulses.  Lymph: No lymphadenopathy of posterior or anterior cervical chain or axillae bilaterally.  Gait normal with good balance and coordination.  MSK:  Non tender with full range of motion and good stability and symmetric strength and tone of  elbows, wrist, hip, knee and ankles bilaterally.  Shoulder: Right Inspection reveals no abnormalities, atrophy or asymmetry. Palpation is normal with no tenderness over AC joint or bicipital groove. ROM is full in all planes passively. Rotator cuff strength normal throughout. Mild signs of impingement but significant improvement. Speeds and Yergason's tests normal. No labral pathology noted with negative Obrien's, negative clunk and good stability. Normal scapular function observed. No painful arc and no drop arm sign. No apprehension sign Contralateral shoulder unremarkable.   MSK US performed of: Right  This study was ordered, performed, and interpreted by Charlann Boxer D.O.  Shoulder:  Supraspinatus positive bursa seen but patient does have some mild degenerative changes of the rotator cuff. Subscapularis: Appears normal on long and transverse views.positive bursa noted Teres Minor: Appears normal on long and transverse views.  AC joint: Capsule undistended, no geyser sign.  Glenohumeral Joint: Appears normal without effusion. Mild to moderate joint arthritis noted Glenoid Labrum: Intact without visualized tears.  Biceps Tendon: Appears normal on long and transverse views, no fraying of tendon, tendon located in intertubercular groove, no subluxation with shoulder internal or external rotation.  Impression: subacromial bursitis  Procedure: Real-time Ultrasound Guided Injection of right glenohumeral joint Device: GE Logiq E  Ultrasound guided injection is preferred based studies that show increased duration, increased effect, greater accuracy, decreased procedural pain, increased response rate  with ultrasound guided versus blind  injection.  Verbal informed consent obtained.  Time-out conducted.  Noted no overlying erythema, induration, or other signs of local infection.  Skin prepped in a sterile fashion.  Local anesthesia: Topical Ethyl chloride.  With sterile technique and under real time ultrasound guidance:  Joint visualized.  23g 1  inch needle inserted posterior approach. Pictures taken for needle placement. Patient did have injection of 2 cc of 1% lidocaine, 2 cc of 0.5% Marcaine, and 1.0 cc of Kenalog 40 mg/dL. Completed without difficulty  Pain immediately resolved suggesting accurate placement of the medication.  Advised to call if fevers/chills, erythema, induration, drainage, or persistent bleeding.  Images permanently stored and available for review in the ultrasound unit.  Impression: Technically successful ultrasound guided injection.    Impression and Recommendations:     This case required medical decision making of moderate complexity.

## 2014-07-22 NOTE — Progress Notes (Signed)
Pre visit review using our clinic review tool, if applicable. No additional management support is needed unless otherwise documented below in the visit note. 

## 2014-07-22 NOTE — Patient Instructions (Signed)
Good to see you Ice 20 minutes 2 times daily. Usually after activity and before bed. Consider the chiropractor looking at upper back.  pennsaid pinkie amount topically 2 times daily as needed.  Try medicine 3 times a day for 3 days  Physical therapy will be calling you  See me again in 3 weeks.

## 2014-07-22 NOTE — Assessment & Plan Note (Signed)
Patient was given an injection today. Patient will be sent to formal physical therapy. We discussed icing and home exercises. Patient will come back and see me again in 3-4 weeks for further evaluation and treatment. Patient we'll also do the over-the-counter medications as we've discussed previously.

## 2014-09-04 ENCOUNTER — Other Ambulatory Visit: Payer: Self-pay

## 2014-09-04 DIAGNOSIS — Z1231 Encounter for screening mammogram for malignant neoplasm of breast: Secondary | ICD-10-CM

## 2014-10-13 ENCOUNTER — Encounter: Payer: Self-pay | Admitting: Internal Medicine

## 2014-10-13 ENCOUNTER — Ambulatory Visit: Payer: BLUE CROSS/BLUE SHIELD

## 2014-10-31 ENCOUNTER — Other Ambulatory Visit: Payer: Self-pay

## 2014-10-31 MED ORDER — TRAMADOL HCL 50 MG PO TABS: 50.0000 mg | ORAL_TABLET | Freq: Three times a day (TID) | ORAL | Status: DC | PRN

## 2014-10-31 NOTE — Telephone Encounter (Signed)
In fax for Rf? Please advise

## 2014-11-05 ENCOUNTER — Emergency Department (HOSPITAL_BASED_OUTPATIENT_CLINIC_OR_DEPARTMENT_OTHER)
Admission: EM | Admit: 2014-11-05 | Discharge: 2014-11-05 | Disposition: A | Discharge status: Home / Self Care | Payer: BLUE CROSS/BLUE SHIELD | Attending: Emergency Medicine | Admitting: Emergency Medicine

## 2014-11-05 ENCOUNTER — Encounter (HOSPITAL_BASED_OUTPATIENT_CLINIC_OR_DEPARTMENT_OTHER): Payer: Self-pay | Admitting: *Deleted

## 2014-11-05 DIAGNOSIS — M1990 Unspecified osteoarthritis, unspecified site: Secondary | ICD-10-CM | POA: Diagnosis not present

## 2014-11-05 DIAGNOSIS — H5711 Ocular pain, right eye: Secondary | ICD-10-CM | POA: Diagnosis present

## 2014-11-05 DIAGNOSIS — H00021 Hordeolum internum right upper eyelid: Secondary | ICD-10-CM | POA: Insufficient documentation

## 2014-11-05 DIAGNOSIS — F329 Major depressive disorder, single episode, unspecified: Secondary | ICD-10-CM | POA: Diagnosis not present

## 2014-11-05 DIAGNOSIS — G4730 Sleep apnea, unspecified: Secondary | ICD-10-CM | POA: Diagnosis not present

## 2014-11-05 DIAGNOSIS — Z8619 Personal history of other infectious and parasitic diseases: Secondary | ICD-10-CM | POA: Insufficient documentation

## 2014-11-05 DIAGNOSIS — Z79899 Other long term (current) drug therapy: Secondary | ICD-10-CM | POA: Diagnosis not present

## 2014-11-05 DIAGNOSIS — Z85820 Personal history of malignant melanoma of skin: Secondary | ICD-10-CM | POA: Diagnosis not present

## 2014-11-05 DIAGNOSIS — I10 Essential (primary) hypertension: Secondary | ICD-10-CM | POA: Diagnosis not present

## 2014-11-05 NOTE — Discharge Instructions (Signed)
Do not apply makeup to the area, apply warm compresses to the affected eye is at least 4 times a day.  Please follow with your primary care doctor in the next 2 days for a check-up. They must obtain records for further management.   Do not hesitate to return to the Emergency Department for any new, worsening or concerning symptoms.    Sty A sty (hordeolum) is an infection of a gland in the eyelid located at the base of the eyelash. A sty may develop a white or yellow head of pus. It can be puffy (swollen). Usually, the sty will burst and pus will come out on its own. They do not leave lumps in the eyelid once they drain. A sty is often confused with another form of cyst of the eyelid called a chalazion. Chalazions occur within the eyelid and not on the edge where the bases of the eyelashes are. They often are red, sore and then form firm lumps in the eyelid. CAUSES   Germs (bacteria).  Lasting (chronic) eyelid inflammation. SYMPTOMS   Tenderness, redness and swelling along the edge of the eyelid at the base of the eyelashes.  Sometimes, there is a white or yellow head of pus. It may or may not drain. DIAGNOSIS  An ophthalmologist will be able to distinguish between a sty and a chalazion and treat the condition appropriately.  TREATMENT   Styes are typically treated with warm packs (compresses) until drainage occurs.  In rare cases, medicines that kill germs (antibiotics) may be prescribed. These antibiotics may be in the form of drops, cream or pills.  If a hard lump has formed, it is generally necessary to do a small incision and remove the hardened contents of the cyst in a minor surgical procedure done in the office.  In suspicious cases, your caregiver may send the contents of the cyst to the lab to be certain that it is not a rare, but dangerous form of cancer of the glands of the eyelid. HOME CARE INSTRUCTIONS   Wash your hands often and dry them with a clean towel. Avoid  touching your eyelid. This may spread the infection to other parts of the eye.  Apply heat to your eyelid for 10 to 20 minutes, several times a day, to ease pain and help to heal it faster.  Do not squeeze the sty. Allow it to drain on its own. Wash your eyelid carefully 3 to 4 times per day to remove any pus. SEEK IMMEDIATE MEDICAL CARE IF:   Your eye becomes painful or puffy (swollen).  Your vision changes.  Your sty does not drain by itself within 3 days.  Your sty comes back within a short period of time, even with treatment.  You have redness (inflammation) around the eye.  You have a fever. Document Released: 11/10/2004 Document Revised: 04/25/2011 Document Reviewed: 05/17/2013 [facility] Patient Information 2015 [place], [place]. This information is not intended to replace advice given to you by your health care provider. Make sure you discuss any questions you have with your health care provider.

## 2014-11-05 NOTE — ED Notes (Signed)
Pt amb to triage with quick steady gait in nad. Pt reports redness and swelling to right upper eyelid x today.

## 2014-11-05 NOTE — ED Provider Notes (Signed)
CSN: [id number]     Arrival date & time 11/05/14  1614 History   First MD Initiated Contact with Patient 11/05/14 1618     Chief Complaint  Patient presents with  . Eye Problem     (Consider location/radiation/quality/duration/timing/severity/associated sxs/prior Treatment) HPI   Blood pressure 140/70, pulse 74, temperature 97.9 F (36.6 C), temperature source Oral, resp. rate 16, height 5\' 5"  (1.651 m), weight 195 lb (88.451 kg), SpO2 99 %.  Kelsey Church is a 64 y.o. female complaining of complaining of right upper eyelid pain onset yesterday. Patient states that initially she had left eye pain and foreign body sensation with associated lacrimation and blurry vision which is now fully resolved. She is tender to palpation of the right eye with swelling. Patient has used Restasis which she takes for dry eyes secondary to her C Pap use with minimal relief. Patient denies change in vision, discharge, erythema or injection, diplopia, contact lens use, sick contacts or recent upper respiratory infections.   Past Medical History  Diagnosis Date  . Melanoma in situ   . Hypertension   . Sleep apnea 09/2011    CPAP  . Sleep apnea   . Depression   . Complication of anesthesia     "chills every evening at sundown x 2 weeks"- no fever  . 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 History of shingles     NO RESIDUAL PROBLEMS  . Pain     LOWER BACK PAIN   Past Surgical History  Procedure Laterality Date  . Appendectomy  1958  . Gallbladder surgery  02/14/1990  . Salpingoophorectomy  2009    Right  . Knee arthroscopy  1996  . Injection knee  09/2011    right   . Colonoscopy w/ polypectomy      7 polyps  . Melanoma excision  1992    right chin, back  . Foot surgery Right     removal plantar wart  . Total knee arthroplasty Right 06/10/2013    Procedure: RIGHT TOTAL KNEE ARTHROPLASTY;  Surgeon: [name], MD;  Location: [facility];  Service:  Orthopedics;  Laterality: Right;  . Total knee arthroplasty Left 12/16/2013    Procedure: LEFT TOTAL KNEE ARTHROPLASTY;  Surgeon: [name], MD;  Location: [facility];  Service: Orthopedics;  Laterality: Left;   Family History  Problem Relation Age of Onset  . Heart disease Father   . Cancer Maternal Grandmother     Breast and Uterine Cancer  . Osteoporosis Mother   . Hypertension Sister   . Diabetes Sister   . Colon cancer Neg Hx   . Rectal cancer Neg Hx   . Stomach cancer Neg Hx    Social History  Substance Use Topics  . Smoking status: Never Smoker   . Smokeless tobacco: Never Used  . Alcohol Use: No   OB History    No data available     Review of Systems  10 systems reviewed and found to be negative, except as noted in the HPI.   Allergies  Review of patient's allergies indicates no known allergies.  Home Medications   Prior to Admission medications   Medication Sig Start Date End Date Taking? Authorizing Provider  citalopram (CELEXA) 20 MG tablet Take 1 tablet (20 mg total) by mouth at bedtime. 07/10/14   Janith Lima, MD  losartan-hydrochlorothiazide (HYZAAR) 100-25 MG per tablet TAKE ONE TABLET BY  MOUTH ONE TIME DAILY  12/12/13   Janith Lima, MD  meloxicam [facility]) 15 MG tablet TAKE ONE TABLET BY MOUTH ONE TIME DAILY  01/28/14   Janith Lima, MD  PREMPRO 0.3-1.5 MG per tablet  02/03/14   Historical Provider, MD  traMADol (ULTRAM) 50 MG tablet Take 1 tablet (50 mg total) by mouth every 8 (eight) hours as needed. for pain 10/31/14   Janith Lima, MD   BP 140/70 mmHg  Pulse 74  Temp(Src) 97.9 F (36.6 C) (Oral)  Resp 16  Ht 5\' 5"  (1.651 m)  Wt 195 lb (88.451 kg)  BMI 32.45 kg/m2  SpO2 99% Physical Exam  Constitutional: She is oriented to person, place, and time. She appears well-developed and well-nourished. No distress.  HENT:  Head: Normocephalic.  Eyes: Conjunctivae and EOM are normal. Pupils are equal, round, and reactive to light.  Right upper  eyelid is erythematous with swelling and tenderness to palpation along the central lid. No focal pointing or fluctuance. Conjunctival injection or discharge. Extraocular movements are intact without pain, pupils are equal round reactive to light.  Cardiovascular: Normal rate.   Pulmonary/Chest: Effort normal. No stridor.  Musculoskeletal: Normal range of motion.  Neurological: She is alert and oriented to person, place, and time.  Psychiatric: She has a normal mood and affect.  Nursing note and vitals reviewed.   ED Course  Procedures (including critical care time) Labs Review Labs Reviewed - No data to display  Imaging Review No results found. I have personally reviewed and evaluated these images and lab results as part of my medical decision-making.   EKG Interpretation None      MDM   Final diagnoses:  Hordeolum internum of right upper eyelid    Filed Vitals:   11/05/14 1620  BP: 140/70  Pulse: 74  Temp: 97.9 F (36.6 C)  TempSrc: Oral  Resp: 16  Height: 5\' 5"  (1.651 m)  Weight: 195 lb (88.451 kg)  SpO2: 99%    Kelsey Church is a pleasant 64 y.o. female presenting with Swelling and irritation to right upper eyelid, no change in her vision. No conjunctival injection. Physical exam consistent with internal hordeolum. Patient advised warm compresses and given ophthalmology referral  Evaluation does not show pathology that would require ongoing emergent intervention or inpatient treatment. Pt is hemodynamically stable and mentating appropriately. Discussed findings and plan with patient/guardian, who agrees with care plan. All questions answered. Return precautions discussed and outpatient follow up given.        Monico Blitz, PA-C 11/05/14 1722  Tanna Furry, MD 11/05/14 2000

## 2014-11-17 ENCOUNTER — Encounter: Payer: Self-pay | Admitting: Internal Medicine

## 2014-11-19 ENCOUNTER — Ambulatory Visit
Admission: RE | Admit: 2014-11-19 | Discharge: 2014-11-19 | Disposition: A | Discharge status: Home / Self Care | Payer: BLUE CROSS/BLUE SHIELD | Source: Ambulatory Visit

## 2014-11-19 ENCOUNTER — Encounter: Payer: Self-pay | Admitting: Obstetrics & Gynecology

## 2014-11-19 DIAGNOSIS — Z1231 Encounter for screening mammogram for malignant neoplasm of breast: Secondary | ICD-10-CM

## 2014-11-27 ENCOUNTER — Other Ambulatory Visit: Payer: Self-pay | Admitting: Internal Medicine

## 2014-11-27 ENCOUNTER — Telehealth: Payer: Self-pay | Admitting: Internal Medicine

## 2014-11-27 DIAGNOSIS — M170 Bilateral primary osteoarthritis of knee: Secondary | ICD-10-CM

## 2014-11-27 MED ORDER — MELOXICAM 15 MG PO TABS: 15.0000 mg | ORAL_TABLET | Freq: Every day | ORAL | Status: DC

## 2014-11-27 NOTE — Telephone Encounter (Signed)
Pt called request refill for meloxicam (MOBIC) 15 MG tablet to be send into [facility]. Please help, she is out of this medication since Saturday and pharmacy stated they are trying to get this refill but haven't heard anything from out office.

## 2014-11-27 NOTE — Telephone Encounter (Signed)
Done, again

## 2014-12-10 ENCOUNTER — Encounter: Payer: Self-pay | Admitting: Internal Medicine

## 2014-12-10 ENCOUNTER — Ambulatory Visit (INDEPENDENT_AMBULATORY_CARE_PROVIDER_SITE_OTHER): Payer: BLUE CROSS/BLUE SHIELD | Admitting: Internal Medicine

## 2014-12-10 ENCOUNTER — Other Ambulatory Visit (INDEPENDENT_AMBULATORY_CARE_PROVIDER_SITE_OTHER): Payer: BLUE CROSS/BLUE SHIELD

## 2014-12-10 VITALS — BP 122/60 | HR 69 | Temp 98.1°F | Resp 16 | Ht 66.0 in | Wt 190.0 lb

## 2014-12-10 DIAGNOSIS — R739 Hyperglycemia, unspecified: Secondary | ICD-10-CM

## 2014-12-10 DIAGNOSIS — Z0000 Encounter for general adult medical examination without abnormal findings: Secondary | ICD-10-CM

## 2014-12-10 DIAGNOSIS — I10 Essential (primary) hypertension: Secondary | ICD-10-CM | POA: Diagnosis not present

## 2014-12-10 DIAGNOSIS — E7800 Pure hypercholesterolemia, unspecified: Secondary | ICD-10-CM

## 2014-12-10 DIAGNOSIS — D510 Vitamin B12 deficiency anemia due to intrinsic factor deficiency: Secondary | ICD-10-CM

## 2014-12-10 DIAGNOSIS — E538 Deficiency of other specified B group vitamins: Secondary | ICD-10-CM

## 2014-12-10 DIAGNOSIS — Z23 Encounter for immunization: Secondary | ICD-10-CM

## 2014-12-10 DIAGNOSIS — K2100 Gastro-esophageal reflux disease with esophagitis, without bleeding: Secondary | ICD-10-CM

## 2014-12-10 DIAGNOSIS — M8580 Other specified disorders of bone density and structure, unspecified site: Secondary | ICD-10-CM

## 2014-12-10 DIAGNOSIS — M899 Disorder of bone, unspecified: Secondary | ICD-10-CM

## 2014-12-10 DIAGNOSIS — R1314 Dysphagia, pharyngoesophageal phase: Secondary | ICD-10-CM | POA: Insufficient documentation

## 2014-12-10 LAB — VITAMIN B12: VITAMIN B 12: 219 pg/mL (ref 211–911)

## 2014-12-10 LAB — CBC WITH DIFFERENTIAL/PLATELET
BASOS ABS: 0 10*3/uL (ref 0.0–0.1)
Basophils Relative: 0.4 % (ref 0.0–3.0)
EOS ABS: 0.2 10*3/uL (ref 0.0–0.7)
Eosinophils Relative: 2.8 % (ref 0.0–5.0)
HCT: 40 % (ref 36.0–46.0)
HEMOGLOBIN: 13.1 g/dL (ref 12.0–15.0)
LYMPHS ABS: 1.2 10*3/uL (ref 0.7–4.0)
LYMPHS PCT: 20 % (ref 12.0–46.0)
MCHC: 32.8 g/dL (ref 30.0–36.0)
MCV: 83.9 fl (ref 78.0–100.0)
MONO ABS: 0.6 10*3/uL (ref 0.1–1.0)
MONOS PCT: 9.7 % (ref 3.0–12.0)
NEUTROS PCT: 67.1 % (ref 43.0–77.0)
Neutro Abs: 4.1 10*3/uL (ref 1.4–7.7)
Platelets: 247 10*3/uL (ref 150.0–400.0)
RBC: 4.77 Mil/uL (ref 3.87–5.11)
RDW: 14.7 % (ref 11.5–15.5)
WBC: 6.1 10*3/uL (ref 4.0–10.5)

## 2014-12-10 LAB — COMPREHENSIVE METABOLIC PANEL
ALBUMIN: 3.9 g/dL (ref 3.5–5.2)
ALT: 14 U/L (ref 0–35)
AST: 14 U/L (ref 0–37)
Alkaline Phosphatase: 77 U/L (ref 39–117)
BILIRUBIN TOTAL: 0.4 mg/dL (ref 0.2–1.2)
BUN: 26 mg/dL — ABNORMAL HIGH (ref 6–23)
CALCIUM: 9.6 mg/dL (ref 8.4–10.5)
CHLORIDE: 103 meq/L (ref 96–112)
CO2: 31 meq/L (ref 19–32)
CREATININE: 1.12 mg/dL (ref 0.40–1.20)
GFR: 52 mL/min — AB (ref >60.00)
Glucose, Bld: 96 mg/dL (ref 70–99)
Potassium: 3.9 mEq/L (ref 3.5–5.1)
Sodium: 142 mEq/L (ref 135–145)
Total Protein: 6.8 g/dL (ref 6.0–8.3)

## 2014-12-10 LAB — LIPID PANEL
CHOL/HDL RATIO: 3
CHOLESTEROL: 183 mg/dL (ref 0–200)
HDL: 54.6 mg/dL (ref >39.00)
LDL CALC: 100 mg/dL — AB (ref 0–99)
NonHDL: 128.16
TRIGLYCERIDES: 140 mg/dL (ref 0.0–149.0)
VLDL: 28 mg/dL (ref 0.0–40.0)

## 2014-12-10 LAB — HEMOGLOBIN A1C: Hgb A1c MFr Bld: 6 % (ref 4.6–6.5)

## 2014-12-10 LAB — IBC PANEL
Iron: 84 ug/dL (ref 42–145)
Saturation Ratios: 18.3 % — ABNORMAL LOW (ref 20.0–50.0)
TRANSFERRIN: 328 mg/dL (ref 212.0–360.0)

## 2014-12-10 LAB — TSH: TSH: 1.2 u[IU]/mL (ref 0.35–4.50)

## 2014-12-10 LAB — FOLATE

## 2014-12-10 LAB — FERRITIN: Ferritin: 9.7 ng/mL — ABNORMAL LOW (ref 10.0–291.0)

## 2014-12-10 LAB — CK: Total CK: 76 U/L (ref 7–177)

## 2014-12-10 MED ORDER — LOSARTAN POTASSIUM-HCTZ 100-25 MG PO TABS: 1.0000 | ORAL_TABLET | Freq: Every day | ORAL | Status: DC

## 2014-12-10 MED ORDER — DEXLANSOPRAZOLE 60 MG PO CPDR: 60.0000 mg | DELAYED_RELEASE_CAPSULE | Freq: Every day | ORAL | Status: DC

## 2014-12-10 MED ORDER — CYANOCOBALAMIN 2000 MCG PO TABS: 2000.0000 ug | ORAL_TABLET | Freq: Every day | ORAL | Status: DC

## 2014-12-10 NOTE — Patient Instructions (Signed)
Preventive Care for Adults, Female A healthy lifestyle and preventive care can promote health and wellness. Preventive health guidelines for women include the following key practices.  A routine yearly physical is a good way to check with your health care provider about your health and preventive screening. It is a chance to share any concerns and updates on your health and to receive a thorough exam.  Visit your dentist for a routine exam and preventive care every 6 months. Brush your teeth twice a day and floss once a day. Good oral hygiene prevents tooth decay and gum disease.  The frequency of eye exams is based on your age, health, family medical history, use of contact lenses, and other factors. Follow your health care provider's recommendations for frequency of eye exams.  Eat a healthy diet. Foods like vegetables, fruits, whole grains, low-fat dairy products, and lean protein foods contain the nutrients you need without too many calories. Decrease your intake of foods high in solid fats, added sugars, and salt. Eat the right amount of calories for you.Get information about a proper diet from your health care provider, if necessary.  Regular physical exercise is one of the most important things you can do for your health. Most adults should get at least 150 minutes of moderate-intensity exercise (any activity that increases your heart rate and causes you to sweat) each week. In addition, most adults need muscle-strengthening exercises on 2 or more days a week.  Maintain a healthy weight. The body mass index (BMI) is a screening tool to identify possible weight problems. It provides an estimate of body fat based on height and weight. Your health care provider can find your BMI and can help you achieve or maintain a healthy weight.For adults 20 years and older:  A BMI below 18.5 is considered underweight.  A BMI of 18.5 to 24.9 is normal.  A BMI of 25 to 29.9 is considered overweight.  A  BMI of 30 and above is considered obese.  Maintain normal blood lipids and cholesterol levels by exercising and minimizing your intake of saturated fat. Eat a balanced diet with plenty of fruit and vegetables. Blood tests for lipids and cholesterol should begin at age 45 and be repeated every 5 years. If your lipid or cholesterol levels are high, you are over 50, or you are at high risk for heart disease, you may need your cholesterol levels checked more frequently.Ongoing high lipid and cholesterol levels should be treated with medicines if diet and exercise are not working.  If you smoke, find out from your health care provider how to quit. If you do not use tobacco, do not start.  Lung cancer screening is recommended for adults aged 45-80 years who are at high risk for developing lung cancer because of a history of smoking. A yearly low-dose CT scan of the lungs is recommended for people who have at least a 30-pack-year history of smoking and are a current smoker or have quit within the past 15 years. A pack year of smoking is smoking an average of 1 pack of cigarettes a day for 1 year (for example: 1 pack a day for 30 years or 2 packs a day for 15 years). Yearly screening should continue until the smoker has stopped smoking for at least 15 years. Yearly screening should be stopped for people who develop a health problem that would prevent them from having lung cancer treatment.  If you are pregnant, do not drink alcohol. If you are  breastfeeding, be very cautious about drinking alcohol. If you are not pregnant and choose to drink alcohol, do not have more than 1 drink per day. One drink is considered to be 12 ounces (355 mL) of beer, 5 ounces (148 mL) of wine, or 1.5 ounces (44 mL) of liquor.  Avoid use of street drugs. Do not share needles with anyone. Ask for help if you need support or instructions about stopping the use of drugs.  High blood pressure causes heart disease and increases the risk  of stroke. Your blood pressure should be checked at least every 1 to 2 years. Ongoing high blood pressure should be treated with medicines if weight loss and exercise do not work.  If you are 55-79 years old, ask your health care provider if you should take aspirin to prevent strokes.  Diabetes screening is done by taking a blood sample to check your blood glucose level after you have not eaten for a certain period of time (fasting). If you are not overweight and you do not have risk factors for diabetes, you should be screened once every 3 years starting at age 45. If you are overweight or obese and you are 40-70 years of age, you should be screened for diabetes every year as part of your cardiovascular risk assessment.  Breast cancer screening is essential preventive care for women. You should practice "breast self-awareness." This means understanding the normal appearance and feel of your breasts and may include breast self-examination. Any changes detected, no matter how small, should be reported to a health care provider. Women in their 20s and 30s should have a clinical breast exam (CBE) by a health care provider as part of a regular health exam every 1 to 3 years. After age 40, women should have a CBE every year. Starting at age 40, women should consider having a mammogram (breast X-ray test) every year. Women who have a family history of breast cancer should talk to their health care provider about genetic screening. Women at a high risk of breast cancer should talk to their health care providers about having an MRI and a mammogram every year.  Breast cancer gene (BRCA)-related cancer risk assessment is recommended for women who have family members with BRCA-related cancers. BRCA-related cancers include breast, ovarian, tubal, and peritoneal cancers. Having family members with these cancers may be associated with an increased risk for harmful changes (mutations) in the breast cancer genes BRCA1 and  BRCA2. Results of the assessment will determine the need for genetic counseling and BRCA1 and BRCA2 testing.  Your health care provider may recommend that you be screened regularly for cancer of the pelvic organs (ovaries, uterus, and vagina). This screening involves a pelvic examination, including checking for microscopic changes to the surface of your cervix (Pap test). You may be encouraged to have this screening done every 3 years, beginning at age 21.  For women ages 30-65, health care providers may recommend pelvic exams and Pap testing every 3 years, or they may recommend the Pap and pelvic exam, combined with testing for human papilloma virus (HPV), every 5 years. Some types of HPV increase your risk of cervical cancer. Testing for HPV may also be done on women of any age with unclear Pap test results.  Other health care providers may not recommend any screening for nonpregnant women who are considered low risk for pelvic cancer and who do not have symptoms. Ask your health care provider if a screening pelvic exam is right for   you.  If you have had past treatment for cervical cancer or a condition that could lead to cancer, you need Pap tests and screening for cancer for at least 20 years after your treatment. If Pap tests have been discontinued, your risk factors (such as having a new sexual partner) need to be reassessed to determine if screening should resume. Some women have medical problems that increase the chance of getting cervical cancer. In these cases, your health care provider may recommend more frequent screening and Pap tests.  Colorectal cancer can be detected and often prevented. Most routine colorectal cancer screening begins at the age of 50 years and continues through age 75 years. However, your health care provider may recommend screening at an earlier age if you have risk factors for colon cancer. On a yearly basis, your health care provider may provide home test kits to check  for hidden blood in the stool. Use of a small camera at the end of a tube, to directly examine the colon (sigmoidoscopy or colonoscopy), can detect the earliest forms of colorectal cancer. Talk to your health care provider about this at age 50, when routine screening begins. Direct exam of the colon should be repeated every 5-10 years through age 75 years, unless early forms of precancerous polyps or small growths are found.  People who are at an increased risk for hepatitis B should be screened for this virus. You are considered at high risk for hepatitis B if:  You were born in a country where hepatitis B occurs often. Talk with your health care provider about which countries are considered high risk.  Your parents were born in a high-risk country and you have not received a shot to protect against hepatitis B (hepatitis B vaccine).  You have HIV or AIDS.  You use needles to inject street drugs.  You live with, or have sex with, someone who has hepatitis B.  You get hemodialysis treatment.  You take certain medicines for conditions like cancer, organ transplantation, and autoimmune conditions.  Hepatitis C blood testing is recommended for all people born from 1945 through 1965 and any individual with known risks for hepatitis C.  Practice safe sex. Use condoms and avoid high-risk sexual practices to reduce the spread of sexually transmitted infections (STIs). STIs include gonorrhea, chlamydia, syphilis, trichomonas, herpes, HPV, and human immunodeficiency virus (HIV). Herpes, HIV, and HPV are viral illnesses that have no cure. They can result in disability, cancer, and death.  You should be screened for sexually transmitted illnesses (STIs) including gonorrhea and chlamydia if:  You are sexually active and are younger than 24 years.  You are older than 24 years and your health care provider tells you that you are at risk for this type of infection.  Your sexual activity has changed  since you were last screened and you are at an increased risk for chlamydia or gonorrhea. Ask your health care provider if you are at risk.  If you are at risk of being infected with HIV, it is recommended that you take a prescription medicine daily to prevent HIV infection. This is called preexposure prophylaxis (PrEP). You are considered at risk if:  You are sexually active and do not regularly use condoms or know the HIV status of your partner(s).  You take drugs by injection.  You are sexually active with a partner who has HIV.  Talk with your health care provider about whether you are at high risk of being infected with HIV. If   you choose to begin PrEP, you should first be tested for HIV. You should then be tested every 3 months for as long as you are taking PrEP.  Osteoporosis is a disease in which the bones lose minerals and strength with aging. This can result in serious bone fractures or breaks. The risk of osteoporosis can be identified using a bone density scan. Women ages 67 years and over and women at risk for fractures or osteoporosis should discuss screening with their health care providers. Ask your health care provider whether you should take a calcium supplement or vitamin D to reduce the rate of osteoporosis.  Menopause can be associated with physical symptoms and risks. Hormone replacement therapy is available to decrease symptoms and risks. You should talk to your health care provider about whether hormone replacement therapy is right for you.  Use sunscreen. Apply sunscreen liberally and repeatedly throughout the day. You should seek shade when your shadow is shorter than you. Protect yourself by wearing long sleeves, pants, a wide-brimmed hat, and sunglasses year round, whenever you are outdoors.  Once a month, do a whole body skin exam, using a mirror to look at the skin on your back. Tell your health care provider of new moles, moles that have irregular borders, moles that  are larger than a pencil eraser, or moles that have changed in shape or color.  Stay current with required vaccines (immunizations).  Influenza vaccine. All adults should be immunized every year.  Tetanus, diphtheria, and acellular pertussis (Td, Tdap) vaccine. Pregnant women should receive 1 dose of Tdap vaccine during each pregnancy. The dose should be obtained regardless of the length of time since the last dose. Immunization is preferred during the 27th-36th week of gestation. An adult who has not previously received Tdap or who does not know her vaccine status should receive 1 dose of Tdap. This initial dose should be followed by tetanus and diphtheria toxoids (Td) booster doses every 10 years. Adults with an unknown or incomplete history of completing a 3-dose immunization series with Td-containing vaccines should begin or complete a primary immunization series including a Tdap dose. Adults should receive a Td booster every 10 years.  Varicella vaccine. An adult without evidence of immunity to varicella should receive 2 doses or a second dose if she has previously received 1 dose. Pregnant females who do not have evidence of immunity should receive the first dose after pregnancy. This first dose should be obtained before leaving the health care facility. The second dose should be obtained 4-8 weeks after the first dose.  Human papillomavirus (HPV) vaccine. Females aged 13-26 years who have not received the vaccine previously should obtain the 3-dose series. The vaccine is not recommended for use in pregnant females. However, pregnancy testing is not needed before receiving a dose. If a female is found to be pregnant after receiving a dose, no treatment is needed. In that case, the remaining doses should be delayed until after the pregnancy. Immunization is recommended for any person with an immunocompromised condition through the age of 61 years if she did not get any or all doses earlier. During the  3-dose series, the second dose should be obtained 4-8 weeks after the first dose. The third dose should be obtained 24 weeks after the first dose and 16 weeks after the second dose.  Zoster vaccine. One dose is recommended for adults aged 30 years or older unless certain conditions are present.  Measles, mumps, and rubella (MMR) vaccine. Adults born  before 1957 generally are considered immune to measles and mumps. Adults born in 1957 or later should have 1 or more doses of MMR vaccine unless there is a contraindication to the vaccine or there is laboratory evidence of immunity to each of the three diseases. A routine second dose of MMR vaccine should be obtained at least 28 days after the first dose for students attending postsecondary schools, health care workers, or international travelers. People who received inactivated measles vaccine or an unknown type of measles vaccine during 1963-1967 should receive 2 doses of MMR vaccine. People who received inactivated mumps vaccine or an unknown type of mumps vaccine before 1979 and are at high risk for mumps infection should consider immunization with 2 doses of MMR vaccine. For females of childbearing age, rubella immunity should be determined. If there is no evidence of immunity, females who are not pregnant should be vaccinated. If there is no evidence of immunity, females who are pregnant should delay immunization until after pregnancy. Unvaccinated health care workers born before 1957 who lack laboratory evidence of measles, mumps, or rubella immunity or laboratory confirmation of disease should consider measles and mumps immunization with 2 doses of MMR vaccine or rubella immunization with 1 dose of MMR vaccine.  Pneumococcal 13-valent conjugate (PCV13) vaccine. When indicated, a person who is uncertain of his immunization history and has no record of immunization should receive the PCV13 vaccine. All adults 65 years of age and older should receive this  vaccine. An adult aged 19 years or older who has certain medical conditions and has not been previously immunized should receive 1 dose of PCV13 vaccine. This PCV13 should be followed with a dose of pneumococcal polysaccharide (PPSV23) vaccine. Adults who are at high risk for pneumococcal disease should obtain the PPSV23 vaccine at least 8 weeks after the dose of PCV13 vaccine. Adults older than 65 years of age who have normal immune system function should obtain the PPSV23 vaccine dose at least 1 year after the dose of PCV13 vaccine.  Pneumococcal polysaccharide (PPSV23) vaccine. When PCV13 is also indicated, PCV13 should be obtained first. All adults aged 65 years and older should be immunized. An adult younger than age 65 years who has certain medical conditions should be immunized. Any person who resides in a nursing home or long-term care facility should be immunized. An adult smoker should be immunized. People with an immunocompromised condition and certain other conditions should receive both PCV13 and PPSV23 vaccines. People with human immunodeficiency virus (HIV) infection should be immunized as soon as possible after diagnosis. Immunization during chemotherapy or radiation therapy should be avoided. Routine use of PPSV23 vaccine is not recommended for American Indians, [place] Natives, or people younger than 65 years unless there are medical conditions that require PPSV23 vaccine. When indicated, people who have unknown immunization and have no record of immunization should receive PPSV23 vaccine. One-time revaccination 5 years after the first dose of PPSV23 is recommended for people aged 19-64 years who have chronic kidney failure, nephrotic syndrome, asplenia, or immunocompromised conditions. People who received 1-2 doses of PPSV23 before age 65 years should receive another dose of PPSV23 vaccine at age 65 years or later if at least 5 years have passed since the previous dose. Doses of PPSV23 are not  needed for people immunized with PPSV23 at or after age 65 years.  Meningococcal vaccine. Adults with asplenia or persistent complement component deficiencies should receive 2 doses of quadrivalent meningococcal conjugate (MenACWY-D) vaccine. The doses should be obtained   at least 2 months apart. Microbiologists working with certain meningococcal bacteria, [place] recruits, people at risk during an outbreak, and people who travel to or live in countries with a high rate of meningitis should be immunized. A first-year college student up through age 34 years who is living in a residence hall should receive a dose if she did not receive a dose on or after her 16th birthday. Adults who have certain high-risk conditions should receive one or more doses of vaccine.  Hepatitis A vaccine. Adults who wish to be protected from this disease, have certain high-risk conditions, work with hepatitis A-infected animals, work in hepatitis A research labs, or travel to or work in countries with a high rate of hepatitis A should be immunized. Adults who were previously unvaccinated and who anticipate close contact with an international adoptee during the first 60 days after arrival in the [place] from a country with a high rate of hepatitis A should be immunized.  Hepatitis B vaccine. Adults who wish to be protected from this disease, have certain high-risk conditions, may be exposed to blood or other infectious body fluids, are household contacts or sex partners of hepatitis B positive people, are clients or workers in certain care facilities, or travel to or work in countries with a high rate of hepatitis B should be immunized.  Haemophilus influenzae type b (Hib) vaccine. A previously unvaccinated person with asplenia or sickle cell disease or having a scheduled splenectomy should receive 1 dose of Hib vaccine. Regardless of previous immunization, a recipient of a hematopoietic stem cell transplant should receive a  3-dose series 6-12 months after her successful transplant. Hib vaccine is not recommended for adults with HIV infection. Preventive Services / Frequency Ages 35 to 4 years  Blood pressure check.** / Every 3-5 years.  Lipid and cholesterol check.** / Every 5 years beginning at age 60.  Clinical breast exam.** / Every 3 years for women in their 71s and 10s.  BRCA-related cancer risk assessment.** / For women who have family members with a BRCA-related cancer (breast, ovarian, tubal, or peritoneal cancers).  Pap test.** / Every 2 years from ages 76 through 26. Every 3 years starting at age 61 through age 76 or 93 with a history of 3 consecutive normal Pap tests.  HPV screening.** / Every 3 years from ages 37 through ages 60 to 51 with a history of 3 consecutive normal Pap tests.  Hepatitis C blood test.** / For any individual with known risks for hepatitis C.  Skin self-exam. / Monthly.  Influenza vaccine. / Every year.  Tetanus, diphtheria, and acellular pertussis (Tdap, Td) vaccine.** / Consult your health care provider. Pregnant women should receive 1 dose of Tdap vaccine during each pregnancy. 1 dose of Td every 10 years.  Varicella vaccine.** / Consult your health care provider. Pregnant females who do not have evidence of immunity should receive the first dose after pregnancy.  HPV vaccine. / 3 doses over 6 months, if 93 and younger. The vaccine is not recommended for use in pregnant females. However, pregnancy testing is not needed before receiving a dose.  Measles, mumps, rubella (MMR) vaccine.** / You need at least 1 dose of MMR if you were born in 1957 or later. You may also need a 2nd dose. For females of childbearing age, rubella immunity should be determined. If there is no evidence of immunity, females who are not pregnant should be vaccinated. If there is no evidence of immunity, females who are  pregnant should delay immunization until after pregnancy.  Pneumococcal  13-valent conjugate (PCV13) vaccine.** / Consult your health care provider.  Pneumococcal polysaccharide (PPSV23) vaccine.** / 1 to 2 doses if you smoke cigarettes or if you have certain conditions.  Meningococcal vaccine.** / 1 dose if you are age 68 to 8 years and a Market researcher living in a residence hall, or have one of several medical conditions, you need to get vaccinated against meningococcal disease. You may also need additional booster doses.  Hepatitis A vaccine.** / Consult your health care provider.  Hepatitis B vaccine.** / Consult your health care provider.  Haemophilus influenzae type b (Hib) vaccine.** / Consult your health care provider. Ages 7 to 53 years  Blood pressure check.** / Every year.  Lipid and cholesterol check.** / Every 5 years beginning at age 25 years.  Lung cancer screening. / Every year if you are aged 11-80 years and have a 30-pack-year history of smoking and currently smoke or have quit within the past 15 years. Yearly screening is stopped once you have quit smoking for at least 15 years or develop a health problem that would prevent you from having lung cancer treatment.  Clinical breast exam.** / Every year after age 48 years.  BRCA-related cancer risk assessment.** / For women who have family members with a BRCA-related cancer (breast, ovarian, tubal, or peritoneal cancers).  Mammogram.** / Every year beginning at age 41 years and continuing for as long as you are in good health. Consult with your health care provider.  Pap test.** / Every 3 years starting at age 65 years through age 37 or 70 years with a history of 3 consecutive normal Pap tests.  HPV screening.** / Every 3 years from ages 72 years through ages 60 to 40 years with a history of 3 consecutive normal Pap tests.  Fecal occult blood test (FOBT) of stool. / Every year beginning at age 21 years and continuing until age 5 years. You may not need to do this test if you get  a colonoscopy every 10 years.  Flexible sigmoidoscopy or colonoscopy.** / Every 5 years for a flexible sigmoidoscopy or every 10 years for a colonoscopy beginning at age 35 years and continuing until age 48 years.  Hepatitis C blood test.** / For all people born from [birth year] through 1965 and any individual with known risks for hepatitis C.  Skin self-exam. / Monthly.  Influenza vaccine. / Every year.  Tetanus, diphtheria, and acellular pertussis (Tdap/Td) vaccine.** / Consult your health care provider. Pregnant women should receive 1 dose of Tdap vaccine during each pregnancy. 1 dose of Td every 10 years.  Varicella vaccine.** / Consult your health care provider. Pregnant females who do not have evidence of immunity should receive the first dose after pregnancy.  Zoster vaccine.** / 1 dose for adults aged 30 years or older.  Measles, mumps, rubella (MMR) vaccine.** / You need at least 1 dose of MMR if you were born in 1957 or later. You may also need a second dose. For females of childbearing age, rubella immunity should be determined. If there is no evidence of immunity, females who are not pregnant should be vaccinated. If there is no evidence of immunity, females who are pregnant should delay immunization until after pregnancy.  Pneumococcal 13-valent conjugate (PCV13) vaccine.** / Consult your health care provider.  Pneumococcal polysaccharide (PPSV23) vaccine.** / 1 to 2 doses if you smoke cigarettes or if you have certain conditions.  Meningococcal vaccine.** /  Consult your health care provider.  Hepatitis A vaccine.** / Consult your health care provider.  Hepatitis B vaccine.** / Consult your health care provider.  Haemophilus influenzae type b (Hib) vaccine.** / Consult your health care provider. Ages 64 years and over  Blood pressure check.** / Every year.  Lipid and cholesterol check.** / Every 5 years beginning at age 23 years.  Lung cancer screening. / Every year if you  are aged 16-80 years and have a 30-pack-year history of smoking and currently smoke or have quit within the past 15 years. Yearly screening is stopped once you have quit smoking for at least 15 years or develop a health problem that would prevent you from having lung cancer treatment.  Clinical breast exam.** / Every year after age 74 years.  BRCA-related cancer risk assessment.** / For women who have family members with a BRCA-related cancer (breast, ovarian, tubal, or peritoneal cancers).  Mammogram.** / Every year beginning at age 44 years and continuing for as long as you are in good health. Consult with your health care provider.  Pap test.** / Every 3 years starting at age 58 years through age 22 or 39 years with 3 consecutive normal Pap tests. Testing can be stopped between 65 and 70 years with 3 consecutive normal Pap tests and no abnormal Pap or HPV tests in the past 10 years.  HPV screening.** / Every 3 years from ages 64 years through ages 70 or 61 years with a history of 3 consecutive normal Pap tests. Testing can be stopped between 65 and 70 years with 3 consecutive normal Pap tests and no abnormal Pap or HPV tests in the past 10 years.  Fecal occult blood test (FOBT) of stool. / Every year beginning at age 40 years and continuing until age 27 years. You may not need to do this test if you get a colonoscopy every 10 years.  Flexible sigmoidoscopy or colonoscopy.** / Every 5 years for a flexible sigmoidoscopy or every 10 years for a colonoscopy beginning at age 7 years and continuing until age 32 years.  Hepatitis C blood test.** / For all people born from [birth year] through 1965 and any individual with known risks for hepatitis C.  Osteoporosis screening.** / A one-time screening for women ages 30 years and over and women at risk for fractures or osteoporosis.  Skin self-exam. / Monthly.  Influenza vaccine. / Every year.  Tetanus, diphtheria, and acellular pertussis (Tdap/Td)  vaccine.** / 1 dose of Td every 10 years.  Varicella vaccine.** / Consult your health care provider.  Zoster vaccine.** / 1 dose for adults aged 35 years or older.  Pneumococcal 13-valent conjugate (PCV13) vaccine.** / Consult your health care provider.  Pneumococcal polysaccharide (PPSV23) vaccine.** / 1 dose for all adults aged 46 years and older.  Meningococcal vaccine.** / Consult your health care provider.  Hepatitis A vaccine.** / Consult your health care provider.  Hepatitis B vaccine.** / Consult your health care provider.  Haemophilus influenzae type b (Hib) vaccine.** / Consult your health care provider. ** Family history and personal history of risk and conditions may change your health care provider's recommendations.   This information is not intended to replace advice given to you by your health care provider. Make sure you discuss any questions you have with your health care provider.   Document Released: 03/29/2001 Document Revised: 02/21/2014 Document Reviewed: 06/28/2010 Elsevier Interactive Patient Education [employer].

## 2014-12-10 NOTE — Progress Notes (Signed)
Subjective:  Patient ID: Kelsey Church, female    DOB: 1950-05-06  Age: 64 y.o. MRN: [id number]  CC: Annual Exam; Anemia; Hypertension; Hyperlipidemia; and Gastroesophageal Reflux   HPI Anila S Matich presents for a CPX but she also complains of worsening heartburn with the feeling that food gets stuck in the lower part of her esophagus for several months. She is not taking anything for the heartburn, she has also had some wt loss, takes an nsaid and has a hx of anemia.  Outpatient Prescriptions Prior to Visit  Medication Sig Dispense Refill  . citalopram (CELEXA) 20 MG tablet Take 1 tablet (20 mg total) by mouth at bedtime. 90 tablet 3  . meloxicam (MOBIC) 15 MG tablet Take 1 tablet (15 mg total) by mouth daily. 90 tablet 1  . PREMPRO 0.3-1.5 MG per tablet   3  . traMADol (ULTRAM) 50 MG tablet Take 1 tablet (50 mg total) by mouth every 8 (eight) hours as needed. for pain 65 tablet 3  . losartan-hydrochlorothiazide (HYZAAR) 100-25 MG per tablet TAKE ONE TABLET BY MOUTH ONE TIME DAILY  90 tablet 3   No facility-administered medications prior to visit.    ROS Review of Systems  Constitutional: Negative.  Negative for fever, chills, diaphoresis, appetite change and fatigue.  HENT: Positive for trouble swallowing. Negative for sore throat and voice change.   Eyes: Negative.   Respiratory: Negative.   Cardiovascular: Negative.  Negative for chest pain, palpitations and leg swelling.  Gastrointestinal: Positive for constipation. Negative for nausea, vomiting, abdominal pain, diarrhea, blood in stool and anal bleeding.  Endocrine: Negative.   Genitourinary: Negative.  Negative for dysuria, urgency, frequency, hematuria, decreased urine volume and difficulty urinating.  Musculoskeletal: Positive for arthralgias. Negative for myalgias, back pain and neck pain.  Skin: Negative.   Allergic/Immunologic: Negative.   Neurological: Negative.  Negative for dizziness, tremors, weakness,  light-headedness, numbness and headaches.  Hematological: Negative.  Negative for adenopathy. Does not bruise/bleed easily.  Psychiatric/Behavioral: Negative.     Objective:  BP 122/60 mmHg  Pulse 69  Temp(Src) 98.1 F (36.7 C) (Oral)  Resp 16  Ht 5\' 6"  (1.676 m)  Wt 190 lb (86.183 kg)  BMI 30.68 kg/m2  SpO2 95%  BP Readings from Last 3 Encounters:  12/10/14 122/60  11/05/14 140/70  07/22/14 110/70    Wt Readings from Last 3 Encounters:  12/10/14 190 lb (86.183 kg)  11/05/14 195 lb (88.451 kg)  07/22/14 185 lb (83.915 kg)    Physical Exam  Constitutional: She is oriented to person, place, and time. She appears well-developed and well-nourished. No distress.  HENT:  Head: Normocephalic and atraumatic.  Mouth/Throat: Oropharynx is clear and moist. No oropharyngeal exudate.  Eyes: Conjunctivae are normal. Right eye exhibits no discharge. Left eye exhibits no discharge. No scleral icterus.  Neck: Normal range of motion. Neck supple. No JVD present. No tracheal deviation present. No thyromegaly present.  Cardiovascular: Normal rate, regular rhythm, normal heart sounds and intact distal pulses.  Exam reveals no gallop and no friction rub.   No murmur heard. Pulmonary/Chest: Effort normal and breath sounds normal. No stridor. No respiratory distress. She has no wheezes. She has no rales. She exhibits no tenderness.  Abdominal: Soft. Bowel sounds are normal. She exhibits no distension and no mass. There is no tenderness. There is no rebound and no guarding.  Musculoskeletal: Normal range of motion. She exhibits no edema or tenderness.  Lymphadenopathy:    She has no cervical adenopathy.  Neurological: She is oriented to person, place, and time.  Skin: Skin is warm and dry. No rash noted. She is not diaphoretic. No erythema. No pallor.  Psychiatric: She has a normal mood and affect. Her behavior is normal. Judgment and thought content normal.  Vitals reviewed.   Lab Results    Component Value Date   WBC 6.1 12/10/2014   HGB 13.1 12/10/2014   HCT 40.0 12/10/2014   PLT 247.0 12/10/2014   GLUCOSE 96 12/10/2014   CHOL 183 12/10/2014   TRIG 140.0 12/10/2014   HDL 54.60 12/10/2014   LDLCALC 100* 12/10/2014   ALT 14 12/10/2014   AST 14 12/10/2014   NA 142 12/10/2014   K 3.9 12/10/2014   CL 103 12/10/2014   CREATININE 1.12 12/10/2014   BUN 26* 12/10/2014   CO2 31 12/10/2014   TSH 1.20 12/10/2014   INR 1.17 12/27/2013   HGBA1C 6.0 12/10/2014    Mm Screening Breast Tomo Bilateral  11/19/2014  CLINICAL DATA:  Screening. EXAM: DIGITAL SCREENING BILATERAL MAMMOGRAM WITH 3D TOMO WITH CAD COMPARISON:  Previous exam(s). ACR Breast Density Category b: There are scattered areas of fibroglandular density. FINDINGS: There are no findings suspicious for malignancy. Images were processed with CAD. IMPRESSION: No mammographic evidence of malignancy. A result letter of this screening mammogram will be mailed directly to the patient. RECOMMENDATION: Screening mammogram in one year. (Code:SM-B-01Y) BI-RADS CATEGORY  1: Negative. Electronically Signed   By: Lillia Mountain M.D.   On: 11/19/2014 15:03    Assessment & Plan:   Jadin was seen today for annual exam, anemia, hypertension, hyperlipidemia and gastroesophageal reflux.  Diagnoses and all orders for this visit:  Essential hypertension, benign- her BP is well controlled, lytes and renal function are stable  Hyperglycemia- she has prediabetes and will cont to work on her lifestyle modifications -     Hemoglobin A1c; Future  Pure hypercholesterolemia- her Framingham risk score is only 2% so I do not recommend that she start a statin  Pernicious anemia- improvement noted but her Vit B12 level is low -     CBC with Differential/Platelet; Future -     IBC panel; Future -     Vitamin B12; Future -     Folate; Future -     Ferritin; Future -     cyanocobalamin 2000 MCG tablet; Take 1 tablet (2,000 mcg total) by mouth  daily.  Need for influenza vaccination -     Flu Vaccine QUAD 36+ mos IM  Gastroesophageal reflux disease with esophagitis- will start a PPI, I am concerned about UGI pathology like stricture so I have asked her to see GI to consider upper endoscopy -     dexlansoprazole (DEXILANT) 60 MG capsule; Take 1 capsule (60 mg total) by mouth daily. -     Ambulatory referral to Gastroenterology  Dysphagia, pharyngoesophageal phase- GI referral -     Ambulatory referral to Gastroenterology  Routine general medical examination at a health care facility -     Lipid panel; Future -     Comprehensive metabolic panel; Future -     CBC with Differential/Platelet; Future -     TSH; Future  Moderate osteopenia- she is due for a DEXA scan -     DG Bone Density; Future  Vitamin B12 deficiency- will start oral B12 replacement therapy -     cyanocobalamin 2000 MCG tablet; Take 1 tablet (2,000 mcg total) by mouth daily.  Other orders -  losartan-hydrochlorothiazide (HYZAAR) 100-25 MG tablet; Take 1 tablet by mouth daily.   I have changed Ms. Linden's losartan-hydrochlorothiazide. I am also having her start on dexlansoprazole and cyanocobalamin. Additionally, I am having her maintain her PREMPRO, citalopram, traMADol, and meloxicam.  Meds ordered this encounter  Medications  . losartan-hydrochlorothiazide (HYZAAR) 100-25 MG tablet    Sig: Take 1 tablet by mouth daily.    Dispense:  90 tablet    Refill:  3  . dexlansoprazole (DEXILANT) 60 MG capsule    Sig: Take 1 capsule (60 mg total) by mouth daily.    Dispense:  90 capsule    Refill:  3  . cyanocobalamin 2000 MCG tablet    Sig: Take 1 tablet (2,000 mcg total) by mouth daily.    Dispense:  90 tablet    Refill:  3   See AVS for instructions about healthy living and anticipatory guidance.  Follow-up: Return in about 4 months (around 04/12/2015).  [name], MD

## 2014-12-10 NOTE — Progress Notes (Signed)
Pre visit review using our clinic review tool, if applicable. No additional management support is needed unless otherwise documented below in the visit note. 

## 2014-12-11 ENCOUNTER — Encounter: Payer: Self-pay | Admitting: Internal Medicine

## 2014-12-11 NOTE — Assessment & Plan Note (Signed)

## 2015-01-05 ENCOUNTER — Ambulatory Visit (INDEPENDENT_AMBULATORY_CARE_PROVIDER_SITE_OTHER)
Admission: RE | Admit: 2015-01-05 | Discharge: 2015-01-05 | Disposition: A | Discharge status: Home / Self Care | Payer: BLUE CROSS/BLUE SHIELD | Source: Ambulatory Visit | Attending: Internal Medicine | Admitting: Internal Medicine

## 2015-01-05 ENCOUNTER — Ambulatory Visit (AMBULATORY_SURGERY_CENTER): Payer: Self-pay | Admitting: *Deleted

## 2015-01-05 VITALS — Ht 66.0 in | Wt 187.0 lb

## 2015-01-05 DIAGNOSIS — M8580 Other specified disorders of bone density and structure, unspecified site: Secondary | ICD-10-CM

## 2015-01-05 DIAGNOSIS — Z86010 Personal history of colonic polyps: Secondary | ICD-10-CM

## 2015-01-05 DIAGNOSIS — M899 Disorder of bone, unspecified: Secondary | ICD-10-CM

## 2015-01-05 DIAGNOSIS — R1310 Dysphagia, unspecified: Secondary | ICD-10-CM

## 2015-01-05 NOTE — Progress Notes (Signed)
No egg or soy allergy No issues with past sedation No diet pills No home 02 use   Pt states she saw Dr Ronnald Ramp 10-26 for GERD and dysphagia. He sent referral to Dr Hilarie Fredrickson for EGD. Ok to scheduled egd/colon 12-5 per Dr Hilarie Fredrickson for gerd, dysphagia   Pt had suprep with her last colon and vomited it, mostly the whole prep and states she wants a different choice. Given miralax as per protocol

## 2015-01-06 ENCOUNTER — Encounter: Payer: Self-pay | Admitting: Internal Medicine

## 2015-01-06 LAB — HM DEXA SCAN: HM DEXA SCAN: NORMAL

## 2015-01-19 ENCOUNTER — Ambulatory Visit (AMBULATORY_SURGERY_CENTER): Payer: BLUE CROSS/BLUE SHIELD | Admitting: Internal Medicine

## 2015-01-19 ENCOUNTER — Encounter: Payer: Self-pay | Admitting: Internal Medicine

## 2015-01-19 VITALS — BP 131/85 | HR 62 | Temp 97.1°F | Resp 33 | Ht 66.0 in | Wt 187.0 lb

## 2015-01-19 DIAGNOSIS — R1310 Dysphagia, unspecified: Secondary | ICD-10-CM | POA: Diagnosis not present

## 2015-01-19 DIAGNOSIS — Z86010 Personal history of colonic polyps: Secondary | ICD-10-CM

## 2015-01-19 LAB — HM COLONOSCOPY

## 2015-01-19 MED ORDER — SODIUM CHLORIDE 0.9 % IV SOLN: 500.0000 mL | INTRAVENOUS | Status: DC

## 2015-01-19 NOTE — Progress Notes (Signed)
To recovery, report to McCoy, RN, VSS 

## 2015-01-19 NOTE — Progress Notes (Signed)
Called to room to assist during endoscopic procedure.  Patient ID and intended procedure confirmed with present staff. Received instructions for my participation in the procedure from the performing physician.  

## 2015-01-19 NOTE — Op Note (Signed)
[facility]  [address] [place], [address]   ENDOSCOPY PROCEDURE REPORT  PATIENT: Kelsey Church, Kelsey Church  MR#: [id number] BIRTHDATE: December 09, 1950 , 64  yrs. old GENDER: female ENDOSCOPIST: Jerene Bears, MD REFERRED BY:  Janith Lima, M.D. PROCEDURE DATE:  01/19/2015 PROCEDURE:  EGD w/ wire guided (Savary) dilation ASA CLASS:     Class II INDICATIONS:  dysphagia. MEDICATIONS: Monitored anesthesia care and Propofol 200 mg IV TOPICAL ANESTHETIC: none  DESCRIPTION OF PROCEDURE: After the risks benefits and alternatives of the procedure were thoroughly explained, informed consent was obtained.  The LB [id number] endoscope was introduced through the mouth and advanced to the second portion of the duodenum , Without limitations.  The instrument was slowly withdrawn as the mucosa was fully examined.   ESOPHAGUS: The mucosa of the esophagus appeared normal.  Empiric dilation using a 66mm (51Fr) Savary dilator over guidewire was performed.  STOMACH: The mucosa of the stomach appeared normal.  DUODENUM: The duodenal mucosa showed no abnormalities in the bulb and 2nd part of the duodenum. Retroflexed views revealed no abnormalities.     The scope was then withdrawn from the patient and the procedure completed.  COMPLICATIONS: There were no immediate complications.  ENDOSCOPIC IMPRESSION: 1.   The mucosa of the esophagus appeared normal; empiric dilation using a 51mm (51Fr) Savary dilator over guidewire 2.   The mucosa of the stomach appeared normal 3.   The duodenal mucosa showed no abnormalities in the bulb and 2nd part of the duodenum  RECOMMENDATIONS: 1.  If dysphagia persist would recommend motility evaluation of swallowing. 2.  Proceed with a Colonoscopy.   eSigned:  Jerene Bears, MD 01/19/2015 9:04 AM    [id number] Evalina Field, MD and The Patient

## 2015-01-19 NOTE — Patient Instructions (Signed)
Discharge instructions given. Handouts on a dilatation diet,diverticulosis and hemorrhoids. Resume previous medications. YOU HAD AN ENDOSCOPIC PROCEDURE TODAY AT [facility]:   Refer to the procedure report that was given to you for any specific questions about what was found during the examination.  If the procedure report does not answer your questions, please call your gastroenterologist to clarify.  If you requested that your care partner not be given the details of your procedure findings, then the procedure report has been included in a sealed envelope for you to review at your convenience later.  YOU SHOULD EXPECT: Some feelings of bloating in the abdomen. Passage of more gas than usual.  Walking can help get rid of the air that was put into your GI tract during the procedure and reduce the bloating. If you had a lower endoscopy (such as a colonoscopy or flexible sigmoidoscopy) you may notice spotting of blood in your stool or on the toilet paper. If you underwent a bowel prep for your procedure, you may not have a normal bowel movement for a few days.  Please Note:  You might notice some irritation and congestion in your nose or some drainage.  This is from the oxygen used during your procedure.  There is no need for concern and it should clear up in a day or so.  SYMPTOMS TO REPORT IMMEDIATELY:   Following lower endoscopy (colonoscopy or flexible sigmoidoscopy):  Excessive amounts of blood in the stool  Significant tenderness or worsening of abdominal pains  Swelling of the abdomen that is new, acute  Fever of 100F or higher   Following upper endoscopy (EGD)  Vomiting of blood or coffee ground material  New chest pain or pain under the shoulder blades  Painful or persistently difficult swallowing  New shortness of breath  Fever of 100F or higher  Black, tarry-looking stools  For urgent or emergent issues, a gastroenterologist can be reached at any hour by  calling [phone].   DIET: Your first meal following the procedure should be a small meal and then it is ok to progress to your normal diet. Heavy or fried foods are harder to digest and may make you feel nauseous or bloated.  Likewise, meals heavy in dairy and vegetables can increase bloating.  Drink plenty of fluids but you should avoid alcoholic beverages for 24 hours.  ACTIVITY:  You should plan to take it easy for the rest of today and you should NOT DRIVE or use heavy machinery until tomorrow (because of the sedation medicines used during the test).    FOLLOW UP: Our staff will call the number listed on your records the next business day following your procedure to check on you and address any questions or concerns that you may have regarding the information given to you following your procedure. If we do not reach you, we will leave a message.  However, if you are feeling well and you are not experiencing any problems, there is no need to return our call.  We will assume that you have returned to your regular daily activities without incident.  If any biopsies were taken you will be contacted by phone or by letter within the next 1-3 weeks.  Please call us at [phone] if you have not heard about the biopsies in 3 weeks.    SIGNATURES/CONFIDENTIALITY: You and/or your care partner have signed paperwork which will be entered into your electronic medical record.  These signatures attest to the fact  that that the information above on your After Visit Summary has been reviewed and is understood.  Full responsibility of the confidentiality of this discharge information lies with you and/or your care-partner.

## 2015-01-19 NOTE — Op Note (Signed)
[facility]  [address]. [place], [id number]   COLONOSCOPY PROCEDURE REPORT  PATIENT: Kelsey, Church  MR#: [id number] BIRTHDATE: 03/21/1950 , 64  yrs. old GENDER: female ENDOSCOPIST: Jerene Bears, MD PROCEDURE DATE:  01/19/2015 PROCEDURE:   Colonoscopy, surveillance First Screening Colonoscopy - Avg.  risk and is 50 yrs.  old or older - No.  Prior Negative Screening - Now for repeat screening. N/A  History of Adenoma - Now for follow-up colonoscopy & has been > or = to 3 yrs.  Yes hx of adenoma.  Has been 3 or more years since last colonoscopy.  Polyps removed today? Yes ASA CLASS:   Class II INDICATIONS:Surveillance due to prior colonic neoplasia and PH Colon Adenoma (2013). MEDICATIONS: Monitored anesthesia care, this was the total dose used for all procedures at this session, and Propofol 450 mg IV  DESCRIPTION OF PROCEDURE:   After the risks benefits and alternatives of the procedure were thoroughly explained, informed consent was obtained.  The digital rectal exam revealed no rectal mass.   The LB [id number]  endoscope was introduced through the anus and advanced to the cecum, which was identified by both the appendix and ileocecal valve. No adverse events experienced. The quality of the prep was good.  (MiraLax was used)  The instrument was then slowly withdrawn as the colon was fully examined. Estimated blood loss is zero unless otherwise noted in this procedure report.      COLON FINDINGS: There was mild diverticulosis noted at the hepatic flexure and in the sigmoid colon.   The examination was otherwise normal.  Retroflexed views revealed internal hemorrhoids. The time to cecum = 6.6 Withdrawal time = 13.2   The scope was withdrawn and the procedure completed.  COMPLICATIONS: There were no immediate complications.  ENDOSCOPIC IMPRESSION: 1.   Mild diverticulosis was noted at the hepatic flexure and in the sigmoid colon 2.   The  examination was otherwise normal  RECOMMENDATIONS: 1.  High fiber diet 2.  Repeat Colonoscopy in 5 years.  eSigned:  Jerene Bears, MD 01/19/2015 9:06 AM   cc:  the patient, Dr. Ronnald Ramp

## 2015-01-20 ENCOUNTER — Encounter: Payer: Self-pay | Admitting: Physician Assistant

## 2015-01-20 ENCOUNTER — Ambulatory Visit (INDEPENDENT_AMBULATORY_CARE_PROVIDER_SITE_OTHER): Payer: BLUE CROSS/BLUE SHIELD | Admitting: Physician Assistant

## 2015-01-20 ENCOUNTER — Telehealth: Payer: Self-pay | Admitting: *Deleted

## 2015-01-20 VITALS — BP 110/50 | HR 80 | Temp 98.7°F | Ht 66.0 in | Wt 188.0 lb

## 2015-01-20 DIAGNOSIS — R21 Rash and other nonspecific skin eruption: Secondary | ICD-10-CM | POA: Diagnosis not present

## 2015-01-20 MED ORDER — PREDNISONE 10 MG PO TABS: ORAL_TABLET | ORAL | Status: DC

## 2015-01-20 NOTE — Assessment & Plan Note (Signed)
No known allergen trigger. Rash is only in photosensitive regions. Prednisone taper will be started. Will check CRP. Referral to Allergy placed. Supportive measures reviewed. See AVS.

## 2015-01-20 NOTE — Telephone Encounter (Signed)
No answer.  No answering machine.  Unable to leave message.  

## 2015-01-20 NOTE — Progress Notes (Signed)
Pre visit review using our clinic review tool, if applicable. No additional management support is needed unless otherwise documented below in the visit note. 

## 2015-01-20 NOTE — Patient Instructions (Signed)
Please go to the lab for blood work. I will call you with results. Take prednisone as directed. Benadryl at bedtime. Zyrtrec in the daytime.  Use sarna lotion and cool compresses for itch.  You will be contacted by Allergy for assessment.

## 2015-01-20 NOTE — Progress Notes (Signed)
Patient presents to clinic today c/o wide spread pruritic rash over the past 3 weeks. Endorses rash is of neck and upper chest, forearms and lower legs bilaterally. Denies pain. Denies change in hygiene product. Denies new pet. Denies contact with similar symptoms. Denies constitutional symptoms. Had same rash in the end of summer that responded well to steroids.  Past Medical History  Diagnosis Date  . 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 Hypertension   . Sleep apnea 09/2011    CPAP  . Sleep apnea   . Depression   . Complication of anesthesia     "chills every evening at sundown x 2 weeks"- no fever  . 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 History of shingles     NO RESIDUAL PROBLEMS  . Pain     LOWER BACK PAIN  . Cancer ([place])     melanoma in 2 sights  . GERD (gastroesophageal reflux disease)   . Dysphagia     Current Outpatient Prescriptions on File Prior to Visit  Medication Sig Dispense Refill  . citalopram (CELEXA) 20 MG tablet Take 1 tablet (20 mg total) by mouth at bedtime. 90 tablet 3  . cyanocobalamin 2000 MCG tablet Take 1 tablet (2,000 mcg total) by mouth daily. (Patient taking differently: Take 1,000 mcg by mouth daily. ) 90 tablet 3  . dexlansoprazole (DEXILANT) 60 MG capsule Take 1 capsule (60 mg total) by mouth daily. 90 capsule 3  . losartan-hydrochlorothiazide (HYZAAR) 100-25 MG tablet Take 1 tablet by mouth daily. 90 tablet 3  . meloxicam (MOBIC) 15 MG tablet Take 1 tablet (15 mg total) by mouth daily. 90 tablet 1  . PREMPRO 0.3-1.5 MG per tablet   3  . traMADol (ULTRAM) 50 MG tablet Take 1 tablet (50 mg total) by mouth every 8 (eight) hours as needed. for pain 65 tablet 3   No current facility-administered medications on file prior to visit.    No Known Allergies  Family History  Problem Relation Age of Onset  . Heart disease Father   . Cancer Maternal Grandmother     Breast and Uterine Cancer  . Osteoporosis Mother    . Hypertension Sister   . Diabetes Sister   . Colon polyps Sister   . Colon cancer Neg Hx   . Rectal cancer Neg Hx   . Stomach cancer Neg Hx   . Esophageal cancer Neg Hx     Social History   Social History  . Marital Status: Widowed    Spouse Name: N/A  . Number of Children: 1  . Years of Education: N/A   Occupational History  . [employer]   Social History Main Topics  . Smoking status: Never Smoker   . Smokeless tobacco: Never Used  . Alcohol Use: No  . Drug Use: No  . Sexual Activity: Not Currently   Other Topics Concern  . None   Social History Narrative   Caffienated drinks-no   Seat belt use often-yes   Regular Exercise-yes   Smoke alarm in the home-yes   Firearms/guns in the home-no   History of physical abuse-no                Review of Systems - See HPI.  All other ROS are negative.  BP 110/50 mmHg  Pulse 80  Temp(Src) 98.7 F (37.1 C) (Oral)  Ht 5\' 6"  (1.676 m)  Wt 188  lb (85.276 kg)  BMI 30.36 kg/m2  SpO2 98%  Physical Exam  Constitutional: She is oriented to person, place, and time and well-developed, well-nourished, and in no distress.  HENT:  Head: Normocephalic and atraumatic.  Cardiovascular: Normal rate, regular rhythm and normal heart sounds.   Pulmonary/Chest: Effort normal and breath sounds normal. No respiratory distress. She has no wheezes. She has no rales. She exhibits no tenderness.  Neurological: She is alert and oriented to person, place, and time.  Skin: Skin is warm and dry.  Macular rash -- only in unclothed areas where light touches.  Vitals reviewed.   Recent Results (from the past 2160 hour(s))  CK     Status: None   Collection Time: 12/10/14  9:31 AM  Result Value Ref Range   Total CK 76 7 - 177 U/L  Lipid panel     Status: Abnormal   Collection Time: 12/10/14  9:31 AM  Result Value Ref Range   Cholesterol 183 0 - 200 mg/dL    Comment: ATP III Classification       Desirable:  < 200 mg/dL                Borderline High:  200 - 239 mg/dL          High:  > = 240 mg/dL   Triglycerides 140.0 0.0 - 149.0 mg/dL    Comment: Normal:  <150 mg/dLBorderline High:  150 - 199 mg/dL   HDL 54.60 >39.00 mg/dL   VLDL 28.0 0.0 - 40.0 mg/dL   LDL Cholesterol 100 (H) 0 - 99 mg/dL   Total CHOL/HDL Ratio 3     Comment:                Men          Women1/2 Average Risk     3.4          3.3Average Risk          5.0          4.42X Average Risk          9.6          7.13X Average Risk          15.0          11.0                       NonHDL 128.16     Comment: NOTE:  Non-HDL goal should be 30 mg/dL higher than patient's LDL goal (i.e. LDL goal of < 70 mg/dL, would have non-HDL goal of < 100 mg/dL)  Comprehensive metabolic panel     Status: Abnormal   Collection Time: 12/10/14  9:31 AM  Result Value Ref Range   Sodium 142 135 - 145 mEq/L   Potassium 3.9 3.5 - 5.1 mEq/L   Chloride 103 96 - 112 mEq/L   CO2 31 19 - 32 mEq/L   Glucose, Bld 96 70 - 99 mg/dL   BUN 26 (H) 6 - 23 mg/dL   Creatinine, Ser 1.12 0.40 - 1.20 mg/dL   Total Bilirubin 0.4 0.2 - 1.2 mg/dL   Alkaline Phosphatase 77 39 - 117 U/L   AST 14 0 - 37 U/L   ALT 14 0 - 35 U/L   Total Protein 6.8 6.0 - 8.3 g/dL   Albumin 3.9 3.5 - 5.2 g/dL   Calcium 9.6 8.4 - 10.5 mg/dL   GFR 52.00 (L) >60.00 mL/min  CBC  with Differential/Platelet     Status: None   Collection Time: 12/10/14  9:31 AM  Result Value Ref Range   WBC 6.1 4.0 - 10.5 K/uL   RBC 4.77 3.87 - 5.11 Mil/uL   Hemoglobin 13.1 12.0 - 15.0 g/dL   HCT 40.0 36.0 - 46.0 %   MCV 83.9 78.0 - 100.0 fl   MCHC 32.8 30.0 - 36.0 g/dL   RDW 14.7 11.5 - 15.5 %   Platelets 247.0 150.0 - 400.0 K/uL   Neutrophils Relative % 67.1 43.0 - 77.0 %   Lymphocytes Relative 20.0 12.0 - 46.0 %   Monocytes Relative 9.7 3.0 - 12.0 %   Eosinophils Relative 2.8 0.0 - 5.0 %   Basophils Relative 0.4 0.0 - 3.0 %   Neutro Abs 4.1 1.4 - 7.7 K/uL   Lymphs Abs 1.2 0.7 - 4.0 K/uL   Monocytes Absolute 0.6 0.1 - 1.0 K/uL    Eosinophils Absolute 0.2 0.0 - 0.7 K/uL   Basophils Absolute 0.0 0.0 - 0.1 K/uL  IBC panel     Status: Abnormal   Collection Time: 12/10/14  9:31 AM  Result Value Ref Range   Iron 84 42 - 145 ug/dL   Transferrin 328.0 212.0 - 360.0 mg/dL   Saturation Ratios 18.3 (L) 20.0 - 50.0 %  Vitamin B12     Status: None   Collection Time: 12/10/14  9:31 AM  Result Value Ref Range   Vitamin B-12 219 211 - 911 pg/mL  Folate     Status: None   Collection Time: 12/10/14  9:31 AM  Result Value Ref Range   Folate >23.8 >5.9 ng/mL  Ferritin     Status: Abnormal   Collection Time: 12/10/14  9:31 AM  Result Value Ref Range   Ferritin 9.7 (L) 10.0 - 291.0 ng/mL  Hemoglobin A1c     Status: None   Collection Time: 12/10/14  9:31 AM  Result Value Ref Range   Hgb A1c MFr Bld 6.0 4.6 - 6.5 %    Comment: Glycemic Control Guidelines for People with Diabetes:Non Diabetic:  <6%Goal of Therapy: <7%Additional Action Suggested:  >8%   TSH     Status: None   Collection Time: 12/10/14  9:31 AM  Result Value Ref Range   TSH 1.20 0.35 - 4.50 uIU/mL  HM DEXA SCAN     Status: None   Collection Time: 01/06/15 12:00 AM  Result Value Ref Range   HM Dexa Scan normal     Assessment/Plan: Rash and nonspecific skin eruption No known allergen trigger. Rash is only in photosensitive regions. Prednisone taper will be started. Will check CRP. Referral to Allergy placed. Supportive measures reviewed. See AVS.

## 2015-01-21 ENCOUNTER — Encounter: Payer: Self-pay | Admitting: Physician Assistant

## 2015-01-21 LAB — HIGH SENSITIVITY CRP: CRP HIGH SENSITIVITY: 3.33 mg/L (ref 0.000–5.000)

## 2015-01-22 LAB — HM COLONOSCOPY

## 2015-02-08 ENCOUNTER — Emergency Department (HOSPITAL_BASED_OUTPATIENT_CLINIC_OR_DEPARTMENT_OTHER)
Admission: EM | Admit: 2015-02-08 | Discharge: 2015-02-08 | Disposition: A | Discharge status: Home / Self Care | Payer: BLUE CROSS/BLUE SHIELD | Attending: Emergency Medicine | Admitting: Emergency Medicine

## 2015-02-08 ENCOUNTER — Encounter (HOSPITAL_BASED_OUTPATIENT_CLINIC_OR_DEPARTMENT_OTHER): Payer: Self-pay | Admitting: Emergency Medicine

## 2015-02-08 DIAGNOSIS — Z79899 Other long term (current) drug therapy: Secondary | ICD-10-CM | POA: Diagnosis not present

## 2015-02-08 DIAGNOSIS — M1990 Unspecified osteoarthritis, unspecified site: Secondary | ICD-10-CM | POA: Insufficient documentation

## 2015-02-08 DIAGNOSIS — G4730 Sleep apnea, unspecified: Secondary | ICD-10-CM | POA: Insufficient documentation

## 2015-02-08 DIAGNOSIS — F329 Major depressive disorder, single episode, unspecified: Secondary | ICD-10-CM | POA: Diagnosis not present

## 2015-02-08 DIAGNOSIS — J069 Acute upper respiratory infection, unspecified: Secondary | ICD-10-CM

## 2015-02-08 DIAGNOSIS — R51 Headache: Secondary | ICD-10-CM | POA: Diagnosis present

## 2015-02-08 DIAGNOSIS — K219 Gastro-esophageal reflux disease without esophagitis: Secondary | ICD-10-CM | POA: Diagnosis not present

## 2015-02-08 DIAGNOSIS — Z8619 Personal history of other infectious and parasitic diseases: Secondary | ICD-10-CM | POA: Diagnosis not present

## 2015-02-08 DIAGNOSIS — Z85820 Personal history of malignant melanoma of skin: Secondary | ICD-10-CM | POA: Diagnosis not present

## 2015-02-08 DIAGNOSIS — I10 Essential (primary) hypertension: Secondary | ICD-10-CM | POA: Insufficient documentation

## 2015-02-08 MED ORDER — AMOXICILLIN-POT CLAVULANATE 875-125 MG PO TABS: 1.0000 | ORAL_TABLET | Freq: Two times a day (BID) | ORAL | Status: DC

## 2015-02-08 NOTE — ED Notes (Signed)
Patient states that she has had sinus pressure and a constant tickle in her throat.

## 2015-02-08 NOTE — ED Provider Notes (Signed)
CSN: [id number]     Arrival date & time 02/08/15  1505 History   First MD Initiated Contact with Patient 02/08/15 1559     Chief Complaint  Patient presents with  . Facial Pain   HPI  Kelsey Church is a 64 y.o. F PMH significant for HTN presenting with sinus pressure and a "tickle" in her throat since Wednesday. She endorses subjective fevers, productive cough, nasal congestion, and states this feels like a previous sinus infection. No shortness of breath, chest pain, abdominal pain, N/V/D.   Past Medical History  Diagnosis Date  . 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 Hypertension   . Sleep apnea 09/2011    CPAP  . Sleep apnea   . Depression   . Complication of anesthesia     "chills every evening at sundown x 2 weeks"- no fever  . 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 History of shingles     NO RESIDUAL PROBLEMS  . Pain     LOWER BACK PAIN  . Cancer ([place])     melanoma in 2 sights  . GERD (gastroesophageal reflux disease)   . Dysphagia    Past Surgical History  Procedure Laterality Date  . Appendectomy  1958  . Gallbladder surgery  02/14/1990  . Salpingoophorectomy  2009    Right  . Knee arthroscopy  1996  . Injection knee  09/2011    right   . Colonoscopy w/ polypectomy      7 polyps  . Melanoma excision  1992    right chin, back  . Foot surgery Right     removal plantar wart  . Total knee arthroplasty Right 06/10/2013    Procedure: RIGHT TOTAL KNEE ARTHROPLASTY;  Surgeon: [name], MD;  Location: [facility];  Service: Orthopedics;  Laterality: Right;  . Total knee arthroplasty Left 12/16/2013    Procedure: LEFT TOTAL KNEE ARTHROPLASTY;  Surgeon: [name], MD;  Location: [facility];  Service: Orthopedics;  Laterality: Left;  . Colonoscopy    . Polypectomy     Family History  Problem Relation Age of Onset  . Heart disease Father   . Cancer Maternal Grandmother     Breast and Uterine Cancer  . Osteoporosis Mother   .  Hypertension Sister   . Diabetes Sister   . Colon polyps Sister   . Colon cancer Neg Hx   . Rectal cancer Neg Hx   . Stomach cancer Neg Hx   . Esophageal cancer Neg Hx    Social History  Substance Use Topics  . Smoking status: Never Smoker   . Smokeless tobacco: Never Used  . Alcohol Use: No   OB History    No data available     Review of Systems  Ten systems are reviewed and are negative for acute change except as noted in the HPI  Allergies  Review of patient's allergies indicates no known allergies.  Home Medications   Prior to Admission medications   Medication Sig Start Date End Date Taking? Authorizing Provider  citalopram (CELEXA) 20 MG tablet Take 1 tablet (20 mg total) by mouth at bedtime. 07/10/14   Janith Lima, MD  cyanocobalamin 2000 MCG tablet Take 1 tablet (2,000 mcg total) by mouth daily. Patient taking differently: Take 1,000 mcg by mouth daily.  12/10/14   Janith Lima, MD  dexlansoprazole (DEXILANT) 60 MG capsule Take 1 capsule (60 mg  total) by mouth daily. 12/10/14   Janith Lima, MD  losartan-hydrochlorothiazide (HYZAAR) 100-25 MG tablet Take 1 tablet by mouth daily. 12/10/14   Janith Lima, MD  meloxicam (MOBIC) 15 MG tablet Take 1 tablet (15 mg total) by mouth daily. 11/27/14   Janith Lima, MD  predniSONE (DELTASONE) 10 MG tablet Take 4 tablets daily (40 mg) for 3 days, then 3 tablets daily for 3 days, then 2 tablets daily for 3 days, then 1 tablet day for 3 days 01/20/15   Brunetta Jeans, PA-C  PREMPRO 0.3-1.5 MG per tablet  02/03/14   Historical Provider, MD  traMADol (ULTRAM) 50 MG tablet Take 1 tablet (50 mg total) by mouth every 8 (eight) hours as needed. for pain 10/31/14   Janith Lima, MD   BP 157/97 mmHg  Pulse 96  Temp(Src) 98.5 F (36.9 C) (Oral)  Resp 18  Ht 5\' 6"  (1.676 m)  Wt 85.276 kg  BMI 30.36 kg/m2  SpO2 100% Physical Exam  Constitutional: She appears well-developed and well-nourished. No distress.  HENT:  Head:  Normocephalic and atraumatic.  Left Ear: External ear normal.  Mouth/Throat: Oropharynx is clear and moist. No oropharyngeal exudate.  Clear effusion behind right tympanic membrane. Swollen turbinate BL.   Eyes: Conjunctivae are normal. Pupils are equal, round, and reactive to light. Right eye exhibits no discharge. Left eye exhibits no discharge. No scleral icterus.  Neck: No tracheal deviation present.  Cardiovascular: Normal rate, regular rhythm, normal heart sounds and intact distal pulses.  Exam reveals no gallop and no friction rub.   No murmur heard. Pulmonary/Chest: Effort normal and breath sounds normal. No respiratory distress. She has no wheezes. She has no rales. She exhibits no tenderness.  Abdominal: Soft. Bowel sounds are normal. She exhibits no distension and no mass. There is no tenderness. There is no rebound and no guarding.  Musculoskeletal: She exhibits no edema.  Lymphadenopathy:    She has no cervical adenopathy.  Neurological: She is alert. Coordination normal.  Skin: Skin is warm and dry. No rash noted. She is not diaphoretic. No erythema.  Psychiatric: She has a normal mood and affect. Her behavior is normal.  Nursing note and vitals reviewed.   ED Course  Procedures   MDM   Final diagnoses:  Upper respiratory infection   Patient uncomfortable appearing and VSS. Most likely URI/sinusitis. Although patient's symptoms have not persisted for very long, will give abx due to patient's history of sinus infections. Patient may be safely discharged home. Discussed reasons for return. Patient to follow-up with primary care provider within one week. Patient in understanding and agreement with the plan.    Sedona Lions, PA-C 02/21/15 2144  Quintella Reichert, MD 02/22/15 1434

## 2015-02-08 NOTE — Discharge Instructions (Signed)
Ms. KAYLN THISSELL,  Nice meeting you! Please follow-up with your primary care provider as needed. Return to the emergency department if you develop shortness of breath, chest pain. Feel better soon!  S. Wendie Simmer, PA-C   Upper Respiratory Infection, Adult Most upper respiratory infections (URIs) are a viral infection of the air passages leading to the lungs. A URI affects the nose, throat, and upper air passages. The most common type of URI is nasopharyngitis and is typically referred to as "the common cold." URIs run their course and usually go away on their own. Most of the time, a URI does not require medical attention, but sometimes a bacterial infection in the upper airways can follow a viral infection. This is called a secondary infection. Sinus and middle ear infections are common types of secondary upper respiratory infections. Bacterial pneumonia can also complicate a URI. A URI can worsen asthma and chronic obstructive pulmonary disease (COPD). Sometimes, these complications can require emergency medical care and may be life threatening.  CAUSES Almost all URIs are caused by viruses. A virus is a type of germ and can spread from one person to another.  RISKS FACTORS You may be at risk for a URI if:   You smoke.   You have chronic heart or lung disease.  You have a weakened defense (immune) system.   You are very young or very old.   You have nasal allergies or asthma.  You work in crowded or poorly ventilated areas.  You work in health care facilities or schools. SIGNS AND SYMPTOMS  Symptoms typically develop 2-3 days after you come in contact with a cold virus. Most viral URIs last 7-10 days. However, viral URIs from the influenza virus (flu virus) can last 14-18 days and are typically more severe. Symptoms may include:   Runny or stuffy (congested) nose.   Sneezing.   Cough.   Sore throat.   Headache.   Fatigue.   Fever.   Loss of appetite.    Pain in your forehead, behind your eyes, and over your cheekbones (sinus pain).  Muscle aches.  DIAGNOSIS  Your health care provider may diagnose a URI by:  Physical exam.  Tests to check that your symptoms are not due to another condition such as:  Strep throat.  Sinusitis.  Pneumonia.  Asthma. TREATMENT  A URI goes away on its own with time. It cannot be cured with medicines, but medicines may be prescribed or recommended to relieve symptoms. Medicines may help:  Reduce your fever.  Reduce your cough.  Relieve nasal congestion. HOME CARE INSTRUCTIONS   Take medicines only as directed by your health care provider.   Gargle warm saltwater or take cough drops to comfort your throat as directed by your health care provider.  Use a warm mist humidifier or inhale steam from a shower to increase air moisture. This may make it easier to breathe.  Drink enough fluid to keep your urine clear or pale yellow.   Eat soups and other clear broths and maintain good nutrition.   Rest as needed.   Return to work when your temperature has returned to normal or as your health care provider advises. You may need to stay home longer to avoid infecting others. You can also use a face mask and careful hand washing to prevent spread of the virus.  Increase the usage of your inhaler if you have asthma.   Do not use any tobacco products, including cigarettes, chewing tobacco, or  electronic cigarettes. If you need help quitting, ask your health care provider. PREVENTION  The best way to protect yourself from getting a cold is to practice good hygiene.   Avoid oral or hand contact with people with cold symptoms.   Wash your hands often if contact occurs.  There is no clear evidence that vitamin C, vitamin E, echinacea, or exercise reduces the chance of developing a cold. However, it is always recommended to get plenty of rest, exercise, and practice good nutrition.  SEEK MEDICAL  CARE IF:   You are getting worse rather than better.   Your symptoms are not controlled by medicine.   You have chills.  You have worsening shortness of breath.  You have brown or red mucus.  You have yellow or brown nasal discharge.  You have pain in your face, especially when you bend forward.  You have a fever.  You have swollen neck glands.  You have pain while swallowing.  You have white areas in the back of your throat. SEEK IMMEDIATE MEDICAL CARE IF:   You have severe or persistent:  Headache.  Ear pain.  Sinus pain.  Chest pain.  You have chronic lung disease and any of the following:  Wheezing.  Prolonged cough.  Coughing up blood.  A change in your usual mucus.  You have a stiff neck.  You have changes in your:  Vision.  Hearing.  Thinking.  Mood. MAKE SURE YOU:   Understand these instructions.  Will watch your condition.  Will get help right away if you are not doing well or get worse.   This information is not intended to replace advice given to you by your health care provider. Make sure you discuss any questions you have with your health care provider.   Document Released: 07/27/2000 Document Revised: 06/17/2014 Document Reviewed: 05/08/2013 Elsevier Interactive Patient Education [employer].

## 2015-03-27 ENCOUNTER — Ambulatory Visit (INDEPENDENT_AMBULATORY_CARE_PROVIDER_SITE_OTHER): Payer: BLUE CROSS/BLUE SHIELD | Admitting: Podiatry

## 2015-03-27 ENCOUNTER — Encounter: Payer: Self-pay | Admitting: Podiatry

## 2015-03-27 VITALS — BP 123/74 | HR 83 | Resp 16 | Ht 66.0 in | Wt 185.0 lb

## 2015-03-27 DIAGNOSIS — B351 Tinea unguium: Secondary | ICD-10-CM

## 2015-03-27 DIAGNOSIS — M2042 Other hammer toe(s) (acquired), left foot: Secondary | ICD-10-CM | POA: Diagnosis not present

## 2015-03-27 NOTE — Progress Notes (Signed)
   Subjective:    Patient ID: Kelsey Church, female    DOB: 1950-11-15, 65 y.o.   MRN: [id number]  HPI Patient presents with a nail problem in their left foot; great toe; nail discoloration. Pt stated, "Got a pedicure 2-3 weeks ago; nail feels weird"; wants to get nail checked.   Review of Systems  All other systems reviewed and are negative.      Objective:   Physical Exam        Assessment & Plan:

## 2015-03-30 ENCOUNTER — Ambulatory Visit: Payer: BLUE CROSS/BLUE SHIELD | Admitting: Internal Medicine

## 2015-03-30 NOTE — Progress Notes (Signed)
Subjective:     Patient ID: Kelsey Church, female   DOB: [birth year], 65 y.o.   MRN: [id number]  HPI patient presents with lifting of her left big toenail and is concerned about some yellow discoloration   Review of Systems  All other systems reviewed and are negative.      Objective:   Physical Exam  Constitutional: She is oriented to person, place, and time.  Cardiovascular: Intact distal pulses.   Musculoskeletal: Normal range of motion.  Neurological: She is oriented to person, place, and time.  Skin: Skin is warm.  Nursing note and vitals reviewed.  neurovascular status intact muscle strength adequate range of motion within normal limits with patient noted to have yellow discoloration of the distal two thirds of the nailbed left with moderate lifting of the left side with no drainage noted no redness and no pain. 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 I did go ahead and I took a small distal lateral piece and I'm sending for evaluation and culturing to determine whether or not fungal element is present and we may consider more aggressive treatment plan depending on response

## 2015-03-31 ENCOUNTER — Telehealth: Payer: Self-pay | Admitting: *Deleted

## 2015-03-31 DIAGNOSIS — B351 Tinea unguium: Secondary | ICD-10-CM

## 2015-03-31 NOTE — Telephone Encounter (Addendum)
Left 1st toenail fragment sent to [facility] for definitive diagnosis of fungal element.  03/27/2015-Dr. Regal reviewed fungal culture of 03/27/2015 as negative for fungus, no treatment.  Unable to leave a message phone rang for over one minute.

## 2015-05-06 ENCOUNTER — Ambulatory Visit (INDEPENDENT_AMBULATORY_CARE_PROVIDER_SITE_OTHER): Payer: BLUE CROSS/BLUE SHIELD | Admitting: Podiatry

## 2015-05-06 ENCOUNTER — Ambulatory Visit (INDEPENDENT_AMBULATORY_CARE_PROVIDER_SITE_OTHER): Payer: BLUE CROSS/BLUE SHIELD

## 2015-05-06 ENCOUNTER — Encounter: Payer: Self-pay | Admitting: Podiatry

## 2015-05-06 ENCOUNTER — Ambulatory Visit: Payer: Self-pay

## 2015-05-06 VITALS — BP 113/69 | HR 74 | Resp 16

## 2015-05-06 DIAGNOSIS — M2042 Other hammer toe(s) (acquired), left foot: Secondary | ICD-10-CM | POA: Diagnosis not present

## 2015-05-06 DIAGNOSIS — B351 Tinea unguium: Secondary | ICD-10-CM | POA: Diagnosis not present

## 2015-05-06 DIAGNOSIS — M79675 Pain in left toe(s): Secondary | ICD-10-CM | POA: Diagnosis not present

## 2015-05-06 DIAGNOSIS — M79674 Pain in right toe(s): Secondary | ICD-10-CM

## 2015-05-06 NOTE — Progress Notes (Signed)
Subjective:     Patient ID: Kelsey Church, female   DOB: 07/04/1950, 65 y.o.   MRN: [id number]  HPI patient presents stating my nail is still yellow along with the other big toenail and I want to have repeated debridements done in order to keep from getting ingrown toenails   Review of Systems     Objective:   Physical Exam Neurovascular status intact muscle strength adequate distal yellow numbness of the hallux nail bilateral localized in nature    Assessment:     Pathology indicated this is strictly trauma    Plan:     Advised that this is strict trauma and do not recommend antifungal treatment and patient will have mostly debridements done to try to keep the ingrown's from reoccurring and is scheduled for 1 month and education was rendered to her today

## 2015-05-08 ENCOUNTER — Ambulatory Visit: Payer: BLUE CROSS/BLUE SHIELD | Admitting: Podiatry

## 2015-05-14 ENCOUNTER — Ambulatory Visit: Payer: BLUE CROSS/BLUE SHIELD | Admitting: Internal Medicine

## 2015-05-18 ENCOUNTER — Ambulatory Visit: Payer: BLUE CROSS/BLUE SHIELD | Admitting: Internal Medicine

## 2015-05-18 DIAGNOSIS — Z0289 Encounter for other administrative examinations: Secondary | ICD-10-CM

## 2015-05-21 ENCOUNTER — Other Ambulatory Visit: Payer: Self-pay | Admitting: Internal Medicine

## 2015-05-22 ENCOUNTER — Encounter: Payer: Self-pay | Admitting: Podiatry

## 2015-05-25 ENCOUNTER — Ambulatory Visit: Payer: BLUE CROSS/BLUE SHIELD | Admitting: Internal Medicine

## 2015-05-28 ENCOUNTER — Other Ambulatory Visit (INDEPENDENT_AMBULATORY_CARE_PROVIDER_SITE_OTHER): Payer: BLUE CROSS/BLUE SHIELD

## 2015-05-28 ENCOUNTER — Encounter: Payer: Self-pay | Admitting: Internal Medicine

## 2015-05-28 ENCOUNTER — Ambulatory Visit (INDEPENDENT_AMBULATORY_CARE_PROVIDER_SITE_OTHER): Payer: BLUE CROSS/BLUE SHIELD | Admitting: Internal Medicine

## 2015-05-28 VITALS — BP 128/80 | HR 85 | Temp 98.5°F | Resp 16 | Ht 66.0 in | Wt 189.0 lb

## 2015-05-28 DIAGNOSIS — D510 Vitamin B12 deficiency anemia due to intrinsic factor deficiency: Secondary | ICD-10-CM

## 2015-05-28 DIAGNOSIS — E538 Deficiency of other specified B group vitamins: Secondary | ICD-10-CM

## 2015-05-28 DIAGNOSIS — Z7184 Encounter for health counseling related to travel: Secondary | ICD-10-CM

## 2015-05-28 DIAGNOSIS — I10 Essential (primary) hypertension: Secondary | ICD-10-CM

## 2015-05-28 DIAGNOSIS — Z7189 Other specified counseling: Secondary | ICD-10-CM

## 2015-05-28 LAB — BASIC METABOLIC PANEL
BUN: 31 mg/dL — AB (ref 6–23)
CHLORIDE: 103 meq/L (ref 96–112)
CO2: 30 mEq/L (ref 19–32)
Calcium: 9 mg/dL (ref 8.4–10.5)
Creatinine, Ser: 0.99 mg/dL (ref 0.40–1.20)
GFR: 59.87 mL/min — AB (ref >60.00)
GLUCOSE: 90 mg/dL (ref 70–99)
POTASSIUM: 3.5 meq/L (ref 3.5–5.1)
Sodium: 141 mEq/L (ref 135–145)

## 2015-05-28 LAB — CBC WITH DIFFERENTIAL/PLATELET
BASOS PCT: 0.5 % (ref 0.0–3.0)
Basophils Absolute: 0 10*3/uL (ref 0.0–0.1)
EOS PCT: 1.9 % (ref 0.0–5.0)
Eosinophils Absolute: 0.2 10*3/uL (ref 0.0–0.7)
HCT: 40.9 % (ref 36.0–46.0)
Hemoglobin: 13.7 g/dL (ref 12.0–15.0)
LYMPHS ABS: 1.2 10*3/uL (ref 0.7–4.0)
Lymphocytes Relative: 13.1 % (ref 12.0–46.0)
MCHC: 33.4 g/dL (ref 30.0–36.0)
MCV: 83.3 fl (ref 78.0–100.0)
MONO ABS: 0.7 10*3/uL (ref 0.1–1.0)
Monocytes Relative: 7.6 % (ref 3.0–12.0)
NEUTROS ABS: 6.9 10*3/uL (ref 1.4–7.7)
NEUTROS PCT: 76.9 % (ref 43.0–77.0)
PLATELETS: 261 10*3/uL (ref 150.0–400.0)
RBC: 4.91 Mil/uL (ref 3.87–5.11)
RDW: 14.7 % (ref 11.5–15.5)
WBC: 9 10*3/uL (ref 4.0–10.5)

## 2015-05-28 LAB — VITAMIN B12: VITAMIN B 12: 756 pg/mL (ref 211–911)

## 2015-05-28 MED ORDER — ATOVAQUONE-PROGUANIL HCL 250-100 MG PO TABS: 1.0000 | ORAL_TABLET | Freq: Every day | ORAL | Status: DC

## 2015-05-28 MED ORDER — RIFAXIMIN 200 MG PO TABS: 200.0000 mg | ORAL_TABLET | Freq: Three times a day (TID) | ORAL | Status: DC

## 2015-05-28 NOTE — Progress Notes (Signed)
Pre visit review using our clinic review tool, if applicable. No additional management support is needed unless otherwise documented below in the visit note. 

## 2015-05-28 NOTE — Patient Instructions (Signed)
Hypertension Hypertension, commonly called high blood pressure, is when the force of blood pumping through your arteries is too strong. Your arteries are the blood vessels that carry blood from your heart throughout your body. A blood pressure reading consists of a higher number over a lower number, such as 110/72. The higher number (systolic) is the pressure inside your arteries when your heart pumps. The lower number (diastolic) is the pressure inside your arteries when your heart relaxes. Ideally you want your blood pressure below 120/80. Hypertension forces your heart to work harder to pump blood. Your arteries may become narrow or stiff. Having untreated or uncontrolled hypertension can cause heart attack, stroke, kidney disease, and other problems. RISK FACTORS Some risk factors for high blood pressure are controllable. Others are not.  Risk factors you cannot control include:   Race. You may be at higher risk if you are African American.  Age. Risk increases with age.  Gender. Men are at higher risk than women before age 45 years. After age 65, women are at higher risk than men. Risk factors you can control include:  Not getting enough exercise or physical activity.  Being overweight.  Getting too much fat, sugar, calories, or salt in your diet.  Drinking too much alcohol. SIGNS AND SYMPTOMS Hypertension does not usually cause signs or symptoms. Extremely high blood pressure (hypertensive crisis) may cause headache, anxiety, shortness of breath, and nosebleed. DIAGNOSIS To check if you have hypertension, your health care provider will measure your blood pressure while you are seated, with your arm held at the level of your heart. It should be measured at least twice using the same arm. Certain conditions can cause a difference in blood pressure between your right and left arms. A blood pressure reading that is higher than normal on one occasion does not mean that you need treatment. If  it is not clear whether you have high blood pressure, you may be asked to return on a different day to have your blood pressure checked again. Or, you may be asked to monitor your blood pressure at home for 1 or more weeks. TREATMENT Treating high blood pressure includes making lifestyle changes and possibly taking medicine. Living a healthy lifestyle can help lower high blood pressure. You may need to change some of your habits. Lifestyle changes may include:  Following the DASH diet. This diet is high in fruits, vegetables, and whole grains. It is low in salt, red meat, and added sugars.  Keep your sodium intake below 2,300 mg per day.  Getting at least 30-45 minutes of aerobic exercise at least 4 times per week.  Losing weight if necessary.  Not smoking.  Limiting alcoholic beverages.  Learning ways to reduce stress. Your health care provider may prescribe medicine if lifestyle changes are not enough to get your blood pressure under control, and if one of the following is true:  You are 18-59 years of age and your systolic blood pressure is above 140.  You are 60 years of age or older, and your systolic blood pressure is above 150.  Your diastolic blood pressure is above 90.  You have diabetes, and your systolic blood pressure is over 140 or your diastolic blood pressure is over 90.  You have kidney disease and your blood pressure is above 140/90.  You have heart disease and your blood pressure is above 140/90. Your personal target blood pressure may vary depending on your medical conditions, your age, and other factors. HOME CARE INSTRUCTIONS    Have your blood pressure rechecked as directed by your health care provider.   Take medicines only as directed by your health care provider. Follow the directions carefully. Blood pressure medicines must be taken as prescribed. The medicine does not work as well when you skip doses. Skipping doses also puts you at risk for  problems.  Do not smoke.   Monitor your blood pressure at home as directed by your health care provider. SEEK MEDICAL CARE IF:   You think you are having a reaction to medicines taken.  You have recurrent headaches or feel dizzy.  You have swelling in your ankles.  You have trouble with your vision. SEEK IMMEDIATE MEDICAL CARE IF:  You develop a severe headache or confusion.  You have unusual weakness, numbness, or feel faint.  You have severe chest or abdominal pain.  You vomit repeatedly.  You have trouble breathing. MAKE SURE YOU:   Understand these instructions.  Will watch your condition.  Will get help right away if you are not doing well or get worse.   This information is not intended to replace advice given to you by your health care provider. Make sure you discuss any questions you have with your health care provider.   Document Released: 01/31/2005 Document Revised: 06/17/2014 Document Reviewed: 11/23/2012 Elsevier Interactive Patient Education 2016 [employer] Inc[employer]  

## 2015-05-28 NOTE — Progress Notes (Signed)
Subjective:  Patient ID: Kelsey Church, female    DOB: April 10, 1950  Age: 65 y.o. MRN: [id number]  CC: Hypertension   HPI Lalia S Capri presents for a blood pressure check. She feels well and offers no complaints. She tells me her blood pressure has been well controlled and she denies headache, blurred vision, chest pain, shortness of breath, or edema.  She is traveling to [place] in 4 weeks and request malaria prophylaxis and a medication to prevent traveler's diarrhea.  She has persistent knee pain he gets symptom relief with meloxicam and tramadol as needed.  Outpatient Prescriptions Prior to Visit  Medication Sig Dispense Refill  . citalopram (CELEXA) 20 MG tablet Take 1 tablet (20 mg total) by mouth at bedtime. 90 tablet 3  . cyanocobalamin 2000 MCG tablet Take 1 tablet (2,000 mcg total) by mouth daily. (Patient taking differently: Take 1,000 mcg by mouth daily. ) 90 tablet 3  . losartan-hydrochlorothiazide (HYZAAR) 100-25 MG tablet Take 1 tablet by mouth daily. 90 tablet 3  . meloxicam (MOBIC) 15 MG tablet TAKE 1 TABLET BY MOUTH ONCE DAILY 90 tablet 0  . PREMPRO 0.3-1.5 MG per tablet   3  . traMADol (ULTRAM) 50 MG tablet Take 1 tablet (50 mg total) by mouth every 8 (eight) hours as needed. for pain 65 tablet 3  . dexlansoprazole (DEXILANT) 60 MG capsule Take 1 capsule (60 mg total) by mouth daily. 90 capsule 3  . amoxicillin-clavulanate (AUGMENTIN) 875-125 MG tablet Take 1 tablet by mouth every 12 (twelve) hours. (Patient taking differently: Take 1 tablet by mouth every 12 (twelve) hours. Needs for dental work) 10 tablet 0   No facility-administered medications prior to visit.    ROS Review of Systems  Constitutional: Negative.  Negative for fever, chills, diaphoresis, appetite change and fatigue.  HENT: Negative.   Eyes: Negative.   Respiratory: Negative.  Negative for cough, choking, chest tightness, shortness of breath and stridor.   Cardiovascular: Negative.  Negative  for chest pain, palpitations and leg swelling.  Gastrointestinal: Negative.  Negative for nausea, abdominal pain, diarrhea and constipation.  Endocrine: Negative.   Genitourinary: Negative.   Musculoskeletal: Positive for arthralgias. Negative for myalgias and back pain.  Skin: Negative.   Allergic/Immunologic: Negative.   Neurological: Negative.  Negative for dizziness, tremors, weakness, light-headedness, numbness and headaches.  Hematological: Negative.  Negative for adenopathy. Does not bruise/bleed easily.  Psychiatric/Behavioral: Negative.  Negative for confusion, sleep disturbance and dysphoric mood. The patient is not nervous/anxious.     Objective:  BP 128/80 mmHg  Pulse 85  Temp(Src) 98.5 F (36.9 C) (Oral)  Ht 5\' 6"  (1.676 m)  Wt 189 lb (85.73 kg)  BMI 30.52 kg/m2  SpO2 98%  BP Readings from Last 3 Encounters:  05/28/15 128/80  05/06/15 113/69  03/27/15 123/74    Wt Readings from Last 3 Encounters:  05/28/15 189 lb (85.73 kg)  03/27/15 185 lb (83.915 kg)  02/08/15 188 lb (85.276 kg)    Physical Exam  Constitutional: She is oriented to person, place, and time. She appears well-developed and well-nourished. No distress.  HENT:  Head: Normocephalic and atraumatic.  Mouth/Throat: Oropharynx is clear and moist. No oropharyngeal exudate.  Eyes: Conjunctivae are normal. Right eye exhibits no discharge. Left eye exhibits no discharge. No scleral icterus.  Neck: Normal range of motion. Neck supple. No JVD present. No tracheal deviation present. No thyromegaly present.  Cardiovascular: Normal rate, regular rhythm, normal heart sounds and intact distal pulses.  Exam reveals  no gallop and no friction rub.   No murmur heard. Pulmonary/Chest: Effort normal and breath sounds normal. No stridor. No respiratory distress. She has no wheezes. She has no rales. She exhibits no tenderness.  Abdominal: Soft. Bowel sounds are normal. She exhibits no distension and no mass. There is no  tenderness. There is no rebound and no guarding.  Musculoskeletal: Normal range of motion. She exhibits no edema or tenderness.  Lymphadenopathy:    She has no cervical adenopathy.  Neurological: She is oriented to person, place, and time.  Skin: Skin is warm and dry. No rash noted. She is not diaphoretic. No erythema. No pallor.  Psychiatric: She has a normal mood and affect. Her behavior is normal. Judgment and thought content normal.  Vitals reviewed.   Lab Results  Component Value Date   WBC 9.0 05/28/2015   HGB 13.7 05/28/2015   HCT 40.9 05/28/2015   PLT 261.0 05/28/2015   GLUCOSE 90 05/28/2015   CHOL 183 12/10/2014   TRIG 140.0 12/10/2014   HDL 54.60 12/10/2014   LDLCALC 100* 12/10/2014   ALT 14 12/10/2014   AST 14 12/10/2014   NA 141 05/28/2015   K 3.5 05/28/2015   CL 103 05/28/2015   CREATININE 0.99 05/28/2015   BUN 31* 05/28/2015   CO2 30 05/28/2015   TSH 1.20 12/10/2014   INR 1.17 12/27/2013   HGBA1C 6.0 12/10/2014    No results found.  Assessment & Plan:   Markeyla was seen today for hypertension.  Diagnoses and all orders for this visit:  Pernicious anemia- improvement noted, will continue oral B12 supplementation -     CBC with Differential/Platelet; Future -     Vitamin B12; Future -     Methylmalonic acid, serum; Future  Vitamin B12 deficiency- improvement noted, will continue oral B12 supplementation -     CBC with Differential/Platelet; Future -     Vitamin B12; Future -     Methylmalonic acid, serum; Future  Travel advice encounter -     atovaquone-proguanil (MALARONE) 250-100 MG TABS tablet; Take 1 tablet by mouth daily. -     rifaximin (XIFAXAN) 200 MG tablet; Take 1 tablet (200 mg total) by mouth 3 (three) times daily.  Essential hypertension, benign- her blood pressure is well controlled, electrolytes and renal function are stable. -     Basic metabolic panel; Future  I have discontinued Ms. Dodds's dexlansoprazole and  amoxicillin-clavulanate. I am also having her start on atovaquone-proguanil and rifaximin. Additionally, I am having her maintain her PREMPRO, citalopram, traMADol, losartan-hydrochlorothiazide, cyanocobalamin, and meloxicam.  Meds ordered this encounter  Medications  . atovaquone-proguanil (MALARONE) 250-100 MG TABS tablet    Sig: Take 1 tablet by mouth daily.    Dispense:  21 tablet    Refill:  0  . rifaximin (XIFAXAN) 200 MG tablet    Sig: Take 1 tablet (200 mg total) by mouth 3 (three) times daily.    Dispense:  14 tablet    Refill:  0     Follow-up: Return in about 6 months (around 11/27/2015).  [name], MD

## 2015-05-31 ENCOUNTER — Encounter: Payer: Self-pay | Admitting: Internal Medicine

## 2015-06-02 LAB — METHYLMALONIC ACID, SERUM: Methylmalonic Acid, Quant: 141 nmol/L (ref 87–318)

## 2015-06-05 ENCOUNTER — Encounter: Payer: Self-pay | Admitting: Podiatry

## 2015-06-05 ENCOUNTER — Ambulatory Visit (INDEPENDENT_AMBULATORY_CARE_PROVIDER_SITE_OTHER): Payer: BLUE CROSS/BLUE SHIELD | Admitting: Podiatry

## 2015-06-05 DIAGNOSIS — B351 Tinea unguium: Secondary | ICD-10-CM

## 2015-06-05 NOTE — Progress Notes (Signed)
Subjective:     Patient ID: Kelsey Church, female   DOB: [birth year], 65 y.o.   MRN: [id number]  HPI This patient presents to the office to discuss both big toenails on both feet. She says she is concerned about the fact that the toenails have become yellowed and unattached over time. She was initially seen by Dr. Paulla Dolly who took a sample and it was determined that the nail was yellow due to trauma. This was performed on the toenail left foot. She is concerned about having nail polish applied at the nail salon. She is concerned about further injury with worsening of the appearance of her toenails both feet  Review of Systems     Objective:   Physical Exam GENERAL APPEARANCE: Alert, conversant. Appropriately groomed. No acute distress.  VASCULAR: Pedal pulses are  palpable at  [facility] and PT bilateral.  Capillary refill time is immediate to all digits,  Normal temperature gradient.  Digital hair growth is present bilateral  NEUROLOGIC: sensation is normal to 5.07 monofilament at 5/5 sites bilateral.  Light touch is intact bilateral, Muscle strength normal.  MUSCULOSKELETAL: acceptable muscle strength, tone and stability bilateral.  Intrinsic muscluature intact bilateral.  Rectus appearance of foot and digits noted bilateral.   DERMATOLOGIC: skin color, texture, and turgor are within normal limits.  No preulcerative lesions or ulcers  are seen, no interdigital maceration noted.  No open lesions present.  . No drainage noted.  NAILS  There is unattachment of distal aspect hallux both feet.There is yellow discoloration to distal nail.      Assessment:    Nail hallux B/L injured     Plan:     ROV.  Debridement of nails.  RTC 3 months. Discussed the nail situation with this patient to provided all of her nails that were unattached nail plate hallux bilateral. Told patient she should keep her nails cut short and that her nails do not appear to be fungus in nature at this time. The nails can be tested  in the future as needed. She was told to return to the office in 3 months and routine debridement of these deformed nails can  be performed at that time    Gardiner Barefoot DPM

## 2015-07-08 ENCOUNTER — Other Ambulatory Visit: Payer: Self-pay | Admitting: Internal Medicine

## 2015-08-03 DIAGNOSIS — M531 Cervicobrachial syndrome: Secondary | ICD-10-CM | POA: Diagnosis not present

## 2015-08-03 DIAGNOSIS — M9901 Segmental and somatic dysfunction of cervical region: Secondary | ICD-10-CM | POA: Diagnosis not present

## 2015-08-03 DIAGNOSIS — M5136 Other intervertebral disc degeneration, lumbar region: Secondary | ICD-10-CM | POA: Diagnosis not present

## 2015-08-03 DIAGNOSIS — M9904 Segmental and somatic dysfunction of sacral region: Secondary | ICD-10-CM | POA: Diagnosis not present

## 2015-08-03 DIAGNOSIS — M9903 Segmental and somatic dysfunction of lumbar region: Secondary | ICD-10-CM | POA: Diagnosis not present

## 2015-08-03 DIAGNOSIS — M5137 Other intervertebral disc degeneration, lumbosacral region: Secondary | ICD-10-CM | POA: Diagnosis not present

## 2015-08-13 DIAGNOSIS — H9313 Tinnitus, bilateral: Secondary | ICD-10-CM | POA: Diagnosis not present

## 2015-08-13 DIAGNOSIS — M9904 Segmental and somatic dysfunction of sacral region: Secondary | ICD-10-CM | POA: Diagnosis not present

## 2015-08-13 DIAGNOSIS — M9901 Segmental and somatic dysfunction of cervical region: Secondary | ICD-10-CM | POA: Diagnosis not present

## 2015-08-13 DIAGNOSIS — H6123 Impacted cerumen, bilateral: Secondary | ICD-10-CM | POA: Diagnosis not present

## 2015-08-13 DIAGNOSIS — M5136 Other intervertebral disc degeneration, lumbar region: Secondary | ICD-10-CM | POA: Diagnosis not present

## 2015-08-13 DIAGNOSIS — M9903 Segmental and somatic dysfunction of lumbar region: Secondary | ICD-10-CM | POA: Diagnosis not present

## 2015-08-13 DIAGNOSIS — M5137 Other intervertebral disc degeneration, lumbosacral region: Secondary | ICD-10-CM | POA: Diagnosis not present

## 2015-08-13 DIAGNOSIS — M531 Cervicobrachial syndrome: Secondary | ICD-10-CM | POA: Diagnosis not present

## 2015-08-27 ENCOUNTER — Other Ambulatory Visit (INDEPENDENT_AMBULATORY_CARE_PROVIDER_SITE_OTHER): Payer: Medicare Other

## 2015-08-27 ENCOUNTER — Ambulatory Visit (INDEPENDENT_AMBULATORY_CARE_PROVIDER_SITE_OTHER): Payer: Medicare Other | Admitting: Internal Medicine

## 2015-08-27 ENCOUNTER — Encounter: Payer: Self-pay | Admitting: Internal Medicine

## 2015-08-27 VITALS — BP 128/78 | HR 81 | Temp 98.3°F | Resp 16 | Ht 66.0 in | Wt 188.0 lb

## 2015-08-27 DIAGNOSIS — H9313 Tinnitus, bilateral: Secondary | ICD-10-CM

## 2015-08-27 DIAGNOSIS — H8113 Benign paroxysmal vertigo, bilateral: Secondary | ICD-10-CM

## 2015-08-27 DIAGNOSIS — R072 Precordial pain: Secondary | ICD-10-CM | POA: Insufficient documentation

## 2015-08-27 DIAGNOSIS — R9431 Abnormal electrocardiogram [ECG] [EKG]: Secondary | ICD-10-CM | POA: Insufficient documentation

## 2015-08-27 DIAGNOSIS — H9319 Tinnitus, unspecified ear: Secondary | ICD-10-CM | POA: Insufficient documentation

## 2015-08-27 DIAGNOSIS — H8110 Benign paroxysmal vertigo, unspecified ear: Secondary | ICD-10-CM | POA: Insufficient documentation

## 2015-08-27 LAB — TROPONIN I: TNIDX: 0 ug/L (ref 0.00–0.06)

## 2015-08-27 NOTE — Patient Instructions (Signed)
Nonspecific Chest Pain  °Chest pain can be caused by many different conditions. There is always a chance that your pain could be related to something serious, such as a heart attack or a blood clot in your lungs. Chest pain can also be caused by conditions that are not life-threatening. If you have chest pain, it is very important to follow up with your health care provider. °CAUSES  °Chest pain can be caused by: °· Heartburn. °· Pneumonia or bronchitis. °· Anxiety or stress. °· Inflammation around your heart (pericarditis) or lung (pleuritis or pleurisy). °· A blood clot in your lung. °· A collapsed lung (pneumothorax). It can develop suddenly on its own (spontaneous pneumothorax) or from trauma to the chest. °· Shingles infection (varicella-zoster virus). °· Heart attack. °· Damage to the bones, muscles, and cartilage that make up your chest wall. This can include: °¨ Bruised bones due to injury. °¨ Strained muscles or cartilage due to frequent or repeated coughing or overwork. °¨ Fracture to one or more ribs. °¨ Sore cartilage due to inflammation (costochondritis). °RISK FACTORS  °Risk factors for chest pain may include: °· Activities that increase your risk for trauma or injury to your chest. °· Respiratory infections or conditions that cause frequent coughing. °· Medical conditions or overeating that can cause heartburn. °· Heart disease or family history of heart disease. °· Conditions or health behaviors that increase your risk of developing a blood clot. °· Having had chicken pox (varicella zoster). °SIGNS AND SYMPTOMS °Chest pain can feel like: °· Burning or tingling on the surface of your chest or deep in your chest. °· Crushing, pressure, aching, or squeezing pain. °· Dull or sharp pain that is worse when you move, cough, or take a deep breath. °· Pain that is also felt in your back, neck, shoulder, or arm, or pain that spreads to any of these areas. °Your chest pain may come and go, or it may stay  constant. °DIAGNOSIS °Lab tests or other studies may be needed to find the cause of your pain. Your health care provider may have you take a test called an ambulatory ECG (electrocardiogram). An ECG records your heartbeat patterns at the time the test is performed. You may also have other tests, such as: °· Transthoracic echocardiogram (TTE). During echocardiography, sound waves are used to create a picture of all of the heart structures and to look at how blood flows through your heart. °· Transesophageal echocardiogram (TEE). This is a more advanced imaging test that obtains images from inside your body. It allows your health care provider to see your heart in finer detail. °· Cardiac monitoring. This allows your health care provider to monitor your heart rate and rhythm in real time. °· Holter monitor. This is a portable device that records your heartbeat and can help to diagnose abnormal heartbeats. It allows your health care provider to track your heart activity for several days, if needed. °· Stress tests. These can be done through exercise or by taking medicine that makes your heart beat more quickly. °· Blood tests. °· Imaging tests. °TREATMENT  °Your treatment depends on what is causing your chest pain. Treatment may include: °· Medicines. These may include: °¨ Acid blockers for heartburn. °¨ Anti-inflammatory medicine. °¨ Pain medicine for inflammatory conditions. °¨ Antibiotic medicine, if an infection is present. °¨ Medicines to dissolve blood clots. °¨ Medicines to treat coronary artery disease. °· Supportive care for conditions that do not require medicines. This may include: °¨ Resting. °¨ Applying heat   or cold packs to injured areas. °¨ Limiting activities until pain decreases. °HOME CARE INSTRUCTIONS °· If you were prescribed an antibiotic medicine, finish it all even if you start to feel better. °· Avoid any activities that bring on chest pain. °· Do not use any tobacco products, including  cigarettes, chewing tobacco, or electronic cigarettes. If you need help quitting, ask your health care provider. °· Do not drink alcohol. °· Take medicines only as directed by your health care provider. °· Keep all follow-up visits as directed by your health care provider. This is important. This includes any further testing if your chest pain does not go away. °· If heartburn is the cause for your chest pain, you may be told to keep your head raised (elevated) while sleeping. This reduces the chance that acid will go from your stomach into your esophagus. °· Make lifestyle changes as directed by your health care provider. These may include: °¨ Getting regular exercise. Ask your health care provider to suggest some activities that are safe for you. °¨ Eating a heart-healthy diet. A registered dietitian can help you to learn healthy eating options. °¨ Maintaining a healthy weight. °¨ Managing diabetes, if necessary. °¨ Reducing stress. °SEEK MEDICAL CARE IF: °· Your chest pain does not go away after treatment. °· You have a rash with blisters on your chest. °· You have a fever. °SEEK IMMEDIATE MEDICAL CARE IF:  °· Your chest pain is worse. °· You have an increasing cough, or you cough up blood. °· You have severe abdominal pain. °· You have severe weakness. °· You faint. °· You have chills. °· You have sudden, unexplained chest discomfort. °· You have sudden, unexplained discomfort in your arms, back, neck, or jaw. °· You have shortness of breath at any time. °· You suddenly start to sweat, or your skin gets clammy. °· You feel nauseous or you vomit. °· You suddenly feel light-headed or dizzy. °· Your heart begins to beat quickly, or it feels like it is skipping beats. °These symptoms may represent a serious problem that is an emergency. Do not wait to see if the symptoms will go away. Get medical help right away. Call your local emergency services (911 in the [place] Do not drive yourself to the hospital. °  °This  information is not intended to replace advice given to you by your health care provider. Make sure you discuss any questions you have with your health care provider. °  °Document Released: 11/10/2004 Document Revised: 02/21/2014 Document Reviewed: 09/06/2013 °Elsevier Interactive Patient Education ©2016 [employer] ° °

## 2015-08-27 NOTE — Progress Notes (Signed)
Subjective:  Patient ID: Kelsey Church, female    DOB: October 18, 1950  Age: 65 y.o. MRN: [id number]  CC: Chest Pain   HPI [name] presents for several complaints. She complains for the last 2 months she has had a few episodes of dyspnea on exertion and sharp chest pain at rest. She has had some dizziness and vertigo but denies diaphoresis, neck pain, syncope or near-syncope. She has had no palpitations or edema. She also complains for the last 7 months she has had the gradual worsening of hearing loss and complains of ringing in both ears.  Outpatient Prescriptions Prior to Visit  Medication Sig Dispense Refill  . citalopram (CELEXA) 20 MG tablet TAKE 1 TABLET (20 MG TOTAL) BY MOUTH AT BEDTIME. 90 tablet 3  . losartan-hydrochlorothiazide (HYZAAR) 100-25 MG tablet Take 1 tablet by mouth daily. 90 tablet 3  . meloxicam (MOBIC) 15 MG tablet TAKE 1 TABLET BY MOUTH ONCE DAILY 90 tablet 1  . PREMPRO 0.3-1.5 MG per tablet   3  . traMADol (ULTRAM) 50 MG tablet Take 1 tablet (50 mg total) by mouth every 8 (eight) hours as needed. for pain 65 tablet 3  . atovaquone-proguanil (MALARONE) 250-100 MG TABS tablet Take 1 tablet by mouth daily. 21 tablet 0  . cyanocobalamin 2000 MCG tablet Take 1 tablet (2,000 mcg total) by mouth daily. (Patient taking differently: Take 1,000 mcg by mouth daily. ) 90 tablet 3  . rifaximin (XIFAXAN) 200 MG tablet Take 1 tablet (200 mg total) by mouth 3 (three) times daily. 14 tablet 0   No facility-administered medications prior to visit.    ROS Review of Systems  Constitutional: Negative.  Negative for fever, chills, diaphoresis, activity change, appetite change, fatigue and unexpected weight change.  HENT: Positive for hearing loss and tinnitus. Negative for sinus pressure, trouble swallowing and voice change.   Eyes: Negative.  Negative for visual disturbance.  Respiratory: Positive for shortness of breath. Negative for apnea, cough, choking, chest tightness,  wheezing and stridor.   Cardiovascular: Positive for chest pain. Negative for palpitations and leg swelling.  Gastrointestinal: Negative.  Negative for nausea, vomiting, abdominal pain, diarrhea, constipation and blood in stool.  Endocrine: Negative.   Genitourinary: Negative.  Negative for difficulty urinating.  Musculoskeletal: Negative.  Negative for myalgias, back pain, joint swelling, arthralgias and neck pain.  Skin: Negative.  Negative for color change and rash.  Allergic/Immunologic: Negative.   Neurological: Positive for dizziness. Negative for tremors, speech difficulty, weakness, numbness and headaches.  Hematological: Negative.  Negative for adenopathy. Does not bruise/bleed easily.  Psychiatric/Behavioral: Negative.  Negative for suicidal ideas and decreased concentration. The patient is not nervous/anxious.     Objective:  BP 128/78 mmHg  Pulse 81  Temp(Src) 98.3 F (36.8 C) (Oral)  Resp 16  Ht 5\' 6"  (1.676 m)  Wt 188 lb (85.276 kg)  BMI 30.36 kg/m2  SpO2 96%  BP Readings from Last 3 Encounters:  08/27/15 128/78  05/28/15 128/80  05/06/15 113/69    Wt Readings from Last 3 Encounters:  08/27/15 188 lb (85.276 kg)  05/28/15 189 lb (85.73 kg)  03/27/15 185 lb (83.915 kg)    Physical Exam  Constitutional: She is oriented to person, place, and time. No distress.  HENT:  Mouth/Throat: Oropharynx is clear and moist. No oropharyngeal exudate.  Eyes: Conjunctivae are normal. Right eye exhibits no discharge. Left eye exhibits no discharge. No scleral icterus.  Neck: Normal range of motion. Neck supple. No JVD present.  No tracheal deviation present. No thyromegaly present.  Cardiovascular: Normal rate, regular rhythm, normal heart sounds and intact distal pulses.  Exam reveals no gallop and no friction rub.   No murmur heard. EKG ----  Sinus  Rhythm  -Nonspecific ST depression  -Nondiagnostic.   ABNORMAL - the ST depression is a new finding  Pulmonary/Chest:  Effort normal and breath sounds normal. No stridor. No respiratory distress. She has no wheezes. She has no rales. She exhibits no tenderness.  Abdominal: Soft. Bowel sounds are normal. She exhibits no distension and no mass. There is no tenderness. There is no rebound and no guarding.  Musculoskeletal: Normal range of motion. She exhibits no edema or tenderness.  Lymphadenopathy:    She has no cervical adenopathy.  Neurological: She is oriented to person, place, and time.  Skin: Skin is warm and dry. No rash noted. She is not diaphoretic. No erythema. No pallor.  Vitals reviewed.   Lab Results  Component Value Date   WBC 10.0 08/27/2015   HGB 13.9 08/27/2015   HCT 42.3 08/27/2015   PLT 267.0 08/27/2015   GLUCOSE 85 08/27/2015   CHOL 183 12/10/2014   TRIG 140.0 12/10/2014   HDL 54.60 12/10/2014   LDLCALC 100* 12/10/2014   ALT 20 08/27/2015   AST 15 08/27/2015   NA 139 08/27/2015   K 4.3 08/27/2015   CL 101 08/27/2015   CREATININE 1.23* 08/27/2015   BUN 35* 08/27/2015   CO2 28 08/27/2015   TSH 1.20 12/10/2014   INR 1.17 12/27/2013   HGBA1C 6.0 12/10/2014    No results found.  Assessment & Plan:   Amiyla was seen today for chest pain.  Diagnoses and all orders for this visit:  Precordial chest pain- her EKG shows a subtle change, her pain is atypical, her cardiac enzymes are negative, I've asked her to undergo a myocardial perfusion scan to screen for ischemia. -     EKG 12-Lead -     Troponin I; Future -     Cardiac panel; Future -     Myocardial Perfusion Imaging; Future  Tinnitus aurium, bilateral- I've asked her to see audiology for evaluation and treatment of this. -     Comprehensive metabolic panel; Future -     CBC with Differential/Platelet; Future -     Ambulatory referral to Audiology  Nonspecific abnormal electrocardiogram (ECG) (EKG)- cardiac enzymes are negative, I've ordered a Lexiscan to see if she is having cardiac ischemia. -     Comprehensive  metabolic panel; Future -     CBC with Differential/Platelet; Future -     Myocardial Perfusion Imaging; Future  BPPV (benign paroxysmal positional vertigo), bilateral- I think she would benefit from starting vestibular therapy with PT. -     PT Vestibular Evaluation; Future -     Ambulatory referral to Physical Therapy  I have discontinued Ms. Evon's cyanocobalamin, atovaquone-proguanil, and rifaximin. I am also having her maintain her PREMPRO, traMADol, losartan-hydrochlorothiazide, meloxicam, and citalopram.  No orders of the defined types were placed in this encounter.     Follow-up: Return in about 4 weeks (around 09/24/2015).  [name], MD

## 2015-08-27 NOTE — Progress Notes (Signed)
Pre visit review using our clinic review tool, if applicable. No additional management support is needed unless otherwise documented below in the visit note. 

## 2015-08-28 ENCOUNTER — Telehealth: Payer: Self-pay | Admitting: Emergency Medicine

## 2015-08-28 ENCOUNTER — Encounter: Payer: Self-pay | Admitting: Internal Medicine

## 2015-08-28 LAB — COMPREHENSIVE METABOLIC PANEL
ALK PHOS: 82 U/L (ref 39–117)
ALT: 20 U/L (ref 0–35)
AST: 15 U/L (ref 0–37)
Albumin: 4.4 g/dL (ref 3.5–5.2)
BILIRUBIN TOTAL: 0.5 mg/dL (ref 0.2–1.2)
BUN: 35 mg/dL — AB (ref 6–23)
CO2: 28 mEq/L (ref 19–32)
CREATININE: 1.23 mg/dL — AB (ref 0.40–1.20)
Calcium: 9.9 mg/dL (ref 8.4–10.5)
Chloride: 101 mEq/L (ref 96–112)
GFR: 46.57 mL/min — ABNORMAL LOW (ref >60.00)
GLUCOSE: 85 mg/dL (ref 70–99)
Potassium: 4.3 mEq/L (ref 3.5–5.1)
SODIUM: 139 meq/L (ref 135–145)
TOTAL PROTEIN: 7.5 g/dL (ref 6.0–8.3)

## 2015-08-28 LAB — CBC WITH DIFFERENTIAL/PLATELET
BASOS ABS: 0 10*3/uL (ref 0.0–0.1)
Basophils Relative: 0.2 % (ref 0.0–3.0)
Eosinophils Absolute: 0.2 10*3/uL (ref 0.0–0.7)
Eosinophils Relative: 2 % (ref 0.0–5.0)
HCT: 42.3 % (ref 36.0–46.0)
Hemoglobin: 13.9 g/dL (ref 12.0–15.0)
LYMPHS ABS: 2 10*3/uL (ref 0.7–4.0)
Lymphocytes Relative: 20.3 % (ref 12.0–46.0)
MCHC: 33 g/dL (ref 30.0–36.0)
MCV: 83.3 fl (ref 78.0–100.0)
MONO ABS: 0.6 10*3/uL (ref 0.1–1.0)
Monocytes Relative: 6.5 % (ref 3.0–12.0)
NEUTROS PCT: 71 % (ref 43.0–77.0)
Neutro Abs: 7.1 10*3/uL (ref 1.4–7.7)
Platelets: 267 10*3/uL (ref 150.0–400.0)
RBC: 5.07 Mil/uL (ref 3.87–5.11)
RDW: 15.1 % (ref 11.5–15.5)
WBC: 10 10*3/uL (ref 4.0–10.5)

## 2015-08-28 LAB — CARDIAC PANEL
CK MB: 1.4 ng/mL (ref 0.3–4.0)
CK TOTAL: 67 U/L (ref 7–177)
Relative Index: 2.1 calc (ref 0.0–2.5)

## 2015-08-28 NOTE — Telephone Encounter (Signed)
appt made

## 2015-08-28 NOTE — Telephone Encounter (Signed)
Pt called and wanted to know if she needs make an appointment for her Myocardial Perfusion imagining or will they call her and set one up? Please follow up thanks.

## 2015-08-31 ENCOUNTER — Encounter: Payer: Self-pay | Admitting: Internal Medicine

## 2015-08-31 ENCOUNTER — Ambulatory Visit: Payer: Medicare Other | Attending: Internal Medicine | Admitting: Physical Therapy

## 2015-08-31 ENCOUNTER — Ambulatory Visit (HOSPITAL_COMMUNITY): Payer: Medicare Other | Attending: Cardiology

## 2015-08-31 DIAGNOSIS — R9431 Abnormal electrocardiogram [ECG] [EKG]: Secondary | ICD-10-CM | POA: Diagnosis not present

## 2015-08-31 DIAGNOSIS — H8112 Benign paroxysmal vertigo, left ear: Secondary | ICD-10-CM | POA: Diagnosis not present

## 2015-08-31 DIAGNOSIS — R072 Precordial pain: Secondary | ICD-10-CM | POA: Diagnosis not present

## 2015-08-31 DIAGNOSIS — I10 Essential (primary) hypertension: Secondary | ICD-10-CM | POA: Insufficient documentation

## 2015-08-31 LAB — MYOCARDIAL PERFUSION IMAGING
CHL CUP NUCLEAR SDS: 3
CHL CUP NUCLEAR SRS: 12
CHL CUP NUCLEAR SSS: 12
CHL CUP RESTING HR STRESS: 62 {beats}/min
LV dias vol: 53 mL (ref 46–106)
LVSYSVOL: 22 mL
NUC STRESS TID: 1.06
Peak HR: 100 {beats}/min
RATE: 0.32

## 2015-08-31 IMAGING — NM NM MISC PROCEDURE
9 series · 54 of 54 positions shown · non-contrast
Comparison: none

[Series 1: wbr_s-proj_st stress_(id)_sa · 6.5mm · 6.51mm/px · 6 of 512 frames shown (1 of 2)]
[frame 43/512]
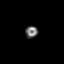
[frame 128/512]
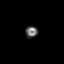
[frame 214/512]
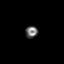
[frame 299/512]
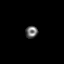
[frame 384/512]
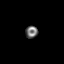
[frame 470/512]
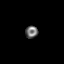

[Series 1: wbr_r-proj_st rest · 6.51mm/px · 6 of 64 frames shown]
[frame 6/64]
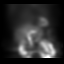
[frame 16/64]
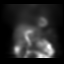
[frame 27/64]
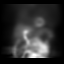
[frame 38/64]
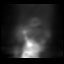
[frame 48/64]
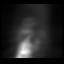
[frame 59/64]
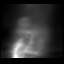

[Series 1: rest · 6.51mm/px · 6 of 64 frames shown]
[frame 6/64]
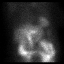
[frame 16/64]
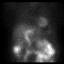
[frame 27/64]
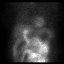
[frame 38/64]
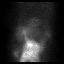
[frame 48/64]
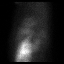
[frame 59/64]
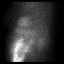

[Series 1: wbr_s-proj_st stress_(id)_sa · 6.5mm · 6.51mm/px · 6 of 64 frames shown (2 of 2)]
[frame 6/64]
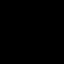
[frame 16/64]
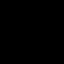
[frame 27/64]
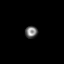
[frame 38/64]
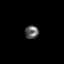
[frame 48/64]
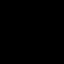
[frame 59/64]
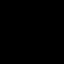

[Series 1: wbr_r-proj_st rest_(id)_sa · 6.5mm · 6.51mm/px · 6 of 64 frames shown]
[frame 6/64]
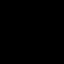
[frame 16/64]
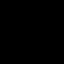
[frame 27/64]
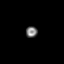
[frame 38/64]
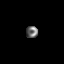
[frame 48/64]
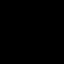
[frame 59/64]
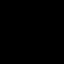

[Series 2: stress · 6.51mm/px · 6 of 512 frames shown (1 of 2)]
[frame 43/512]
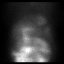
[frame 128/512]
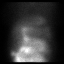
[frame 214/512]
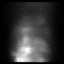
[frame 299/512]
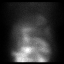
[frame 384/512]
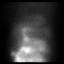
[frame 470/512]
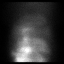

[Series 2: wbr_s-proj_st stress · 6.51mm/px · 6 of 512 frames shown (1 of 2)]
[frame 43/512]
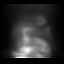
[frame 128/512]
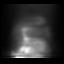
[frame 214/512]
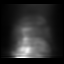
[frame 299/512]
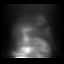
[frame 384/512]
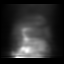
[frame 470/512]
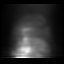

[Series 2: wbr_s-proj_st stress · 6.51mm/px · 6 of 64 frames shown (2 of 2)]
[frame 6/64]
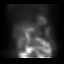
[frame 16/64]
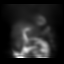
[frame 27/64]
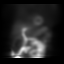
[frame 38/64]
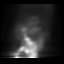
[frame 48/64]
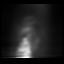
[frame 59/64]
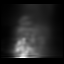

[Series 2: stress · 6.51mm/px · 6 of 64 frames shown (2 of 2)]
[frame 6/64]
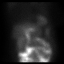
[frame 16/64]
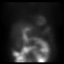
[frame 27/64]
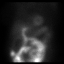
[frame 38/64]
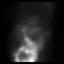
[frame 48/64]
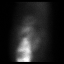
[frame 59/64]
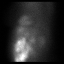

[54 of 54 positions shown; findings below may reference images not displayed]

Canned report from images found in remote index.

Refer to host system for actual result text.

## 2015-08-31 MED ORDER — TECHNETIUM TC 99M TETROFOSMIN IV KIT
10.5000 | PACK | Freq: Once | INTRAVENOUS | Status: AC | PRN
  Administered 2015-08-31: 11 via INTRAVENOUS
  Filled 2015-08-31: qty 11

## 2015-08-31 MED ORDER — TECHNETIUM TC 99M TETROFOSMIN IV KIT
32.9000 | PACK | Freq: Once | INTRAVENOUS | Status: AC | PRN
  Administered 2015-08-31: 32.9 via INTRAVENOUS
  Filled 2015-08-31: qty 33

## 2015-08-31 MED ORDER — REGADENOSON 0.4 MG/5ML IV SOLN
0.4000 mg | Freq: Once | INTRAVENOUS | Status: AC
  Administered 2015-08-31: 0.4 mg via INTRAVENOUS

## 2015-09-01 NOTE — Patient Instructions (Signed)
Benign Positional Vertigo Vertigo is the feeling that you or your surroundings are moving when they are not. Benign positional vertigo is the most common form of vertigo. The cause of this condition is not serious (is benign). This condition is triggered by certain movements and positions (is positional). This condition can be dangerous if it occurs while you are doing something that could endanger you or others, such as driving.  CAUSES In many cases, the cause of this condition is not known. It may be caused by a disturbance in an area of the inner ear that helps your brain to sense movement and balance. This disturbance can be caused by a viral infection (labyrinthitis), head injury, or repetitive motion. RISK FACTORS This condition is more likely to develop in:  Women.  People who are 50 years of age or older. SYMPTOMS Symptoms of this condition usually happen when you move your head or your eyes in different directions. Symptoms may start suddenly, and they usually last for less than a minute. Symptoms may include:  Loss of balance and falling.  Feeling like you are spinning or moving.  Feeling like your surroundings are spinning or moving.  Nausea and vomiting.  Blurred vision.  Dizziness.  Involuntary eye movement (nystagmus). Symptoms can be mild and cause only slight annoyance, or they can be severe and interfere with daily life. Episodes of benign positional vertigo may return (recur) over time, and they may be triggered by certain movements. Symptoms may improve over time. DIAGNOSIS This condition is usually diagnosed by medical history and a physical exam of the head, neck, and ears. You may be referred to a health care provider who specializes in ear, nose, and throat (ENT) problems (otolaryngologist) or a provider who specializes in disorders of the nervous system (neurologist). You may have additional testing, including:  MRI.  A CT scan.  Eye movement tests. Your  health care provider may ask you to change positions quickly while he or she watches you for symptoms of benign positional vertigo, such as nystagmus. Eye movement may be tested with an electronystagmogram (ENG), caloric stimulation, the Dix-Hallpike test, or the roll test.  An electroencephalogram (EEG). This records electrical activity in your brain.  Hearing tests. TREATMENT Usually, your health care provider will treat this by moving your head in specific positions to adjust your inner ear back to normal. Surgery may be needed in severe cases, but this is rare. In some cases, benign positional vertigo may resolve on its own in 2-4 weeks. HOME CARE INSTRUCTIONS Safety  Move slowly.Avoid sudden body or head movements.  Avoid driving.  Avoid operating heavy machinery.  Avoid doing any tasks that would be dangerous to you or others if a vertigo episode would occur.  If you have trouble walking or keeping your balance, try using a cane for stability. If you feel dizzy or unstable, sit down right away.  Return to your normal activities as told by your health care provider. Ask your health care provider what activities are safe for you. General Instructions  Take over-the-counter and prescription medicines only as told by your health care provider.  Avoid certain positions or movements as told by your health care provider.  Drink enough fluid to keep your urine clear or pale yellow.  Keep all follow-up visits as told by your health care provider. This is important. SEEK MEDICAL CARE IF:  You have a fever.  Your condition gets worse or you develop new symptoms.  Your family or friends   notice any behavioral changes.  Your nausea or vomiting gets worse.  You have numbness or a "pins and needles" sensation. SEEK IMMEDIATE MEDICAL CARE IF:  You have difficulty speaking or moving.  You are always dizzy.  You faint.  You develop severe headaches.  You have weakness in your  legs or arms.  You have changes in your hearing or vision.  You develop a stiff neck.  You develop sensitivity to light.   This information is not intended to replace advice given to you by your health care provider. Make sure you discuss any questions you have with your health care provider.   Document Released: 11/08/2005 Document Revised: 10/22/2014 Document Reviewed: 05/26/2014 Elsevier Interactive Patient Education 2016 [employer]  

## 2015-09-01 NOTE — Therapy (Signed)
[facility] [address] [place], [place], [address] Phone: [phone]   Fax:  [phone]  Physical Therapy Evaluation  Patient Details  Name: Kelsey Church MRN: [id number] Date of Birth: 08-23-1950 Referring Provider: Dr. [name]  Encounter Date: 08/31/2015      PT End of Session - 09/01/15 1858    Visit Number 1   Number of Visits 2   Date for PT Re-Evaluation 10/02/15   Authorization Type Medicare   Authorization Time Period 08-31-15 - 10-30-15   PT Start Time 1402   PT Stop Time 1445   PT Time Calculation (min) 43 min      Past Medical History  Diagnosis Date  . 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 Hypertension   . Sleep apnea 09/2011    CPAP  . Sleep apnea   . Depression   . Complication of anesthesia     "chills every evening at sundown x 2 weeks"- no fever  . 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 History of shingles     NO RESIDUAL PROBLEMS  . Pain     LOWER BACK PAIN  . Cancer ([place])     melanoma in 2 sights  . GERD (gastroesophageal reflux disease)   . Dysphagia     Past Surgical History  Procedure Laterality Date  . Appendectomy  1958  . Gallbladder surgery  02/14/1990  . Salpingoophorectomy  2009    Right  . Knee arthroscopy  1996  . Injection knee  09/2011    right   . Colonoscopy w/ polypectomy      7 polyps  . Melanoma excision  1992    right chin, back  . Foot surgery Right     removal plantar wart  . Total knee arthroplasty Right 06/10/2013    Procedure: RIGHT TOTAL KNEE ARTHROPLASTY;  Surgeon: [name], MD;  Location: [facility];  Service: Orthopedics;  Laterality: Right;  . Total knee arthroplasty Left 12/16/2013    Procedure: LEFT TOTAL KNEE ARTHROPLASTY;  Surgeon: [name], MD;  Location: [facility];  Service: Orthopedics;  Laterality: Left;  . Colonoscopy    . Polypectomy      There were no vitals filed for this  visit.       Subjective Assessment - 09/01/15 1842    Subjective Pt reports tinnitus started in Jan. 2017; reports vertigo in supine position started in June; reports tinnitus at this time but no vertigo; denies vomiting but has had some queasiness   Pertinent History L TKR Nov. 2015: shingles occurred 1 week after TKR   Patient Stated Goals resolve the ringing in her ears and resolve the vertigo   Currently in Pain? No/denies            [facility] PT Assessment - 09/01/15 1844    Assessment   Medical Diagnosis BPPV   Referring Provider Dr. [name]   Onset Date/Surgical Date --  Jan. 2017   Balance Screen   Has the patient fallen in the past 6 months No   Has the patient had a decrease in activity level because of a fear of falling?  No   Is the patient reluctant to leave their home because of a fear of falling?  No   Prior Function   Level of Independence Independent            Vestibular Assessment -  09/01/15 0001    Vestibular Assessment   General Observation pt is a 65 yr old lady with c/o vertigo that started in Jan. - reports spinning vertigo when she turns over onto right side   Symptom Behavior   Type of Dizziness Spinning   Frequency of Dizziness varies   Duration of Dizziness seconds   Aggravating Factors Rolling to right;Sitting with head tilted back   Relieving Factors Lying supine   Occulomotor Exam   Occulomotor Alignment Normal   Positional Testing   Dix-Hallpike Dix-Hallpike Right;Dix-Hallpike Left   Sidelying Test Sidelying Right;Sidelying Left   Horizontal Canal Testing Horizontal Canal Right;Horizontal Canal Left   Dix-Hallpike Right   Dix-Hallpike Right Duration none   Dix-Hallpike Right Symptoms No nystagmus  minimal in intensity   Dix-Hallpike Left   Dix-Hallpike Left Duration seconds   Dix-Hallpike Left Symptoms Left nystagmus  minimal intensity   Sidelying Right   Sidelying Right Duration none   Sidelying Left   Sidelying Left  Duration seconds   Horizontal Canal Right   Horizontal Canal Right Duration none   Horizontal Canal Left   Horizontal Canal Left Duration none                       PT Education - 09/01/15 1855    Education provided Yes   Education Details bppv info   Person(s) Educated Patient   Methods Explanation   Comprehension Verbalized understanding             PT Long Term Goals - 09/01/15 1902    PT LONG TERM GOAL #1   Title Pt will have a (-) L Dix-Hallpike test to indicate that L BPPV has resolved.  (10-01-15)   Time 4   Period Weeks   Status New   PT LONG TERM GOAL #2   Title Independent in Brandt-Daroff exercises prn.  (10-01-15)   Time 4   Period Weeks   Status New               Plan - 09/01/15 1900    Clinical Impression Statement Pt has signs and symptoms consistent with R BPPV - appears to be resolved after 2 reps of Epley's maneuver   Rehab Potential Good   PT Frequency 1x / week   PT Duration 2 weeks   PT Treatment/Interventions ADLs/Self Care Home Management;Canalith Repostioning;Therapeutic activities;Therapeutic exercise;Balance training;Gait training;Neuromuscular re-education;Patient/family education;Vestibular   PT Next Visit Plan recheck L Dix-Hallpike    PT Home Exercise Plan Brandt-Daroff prn   Consulted and Agree with Plan of Care Patient      Patient will benefit from skilled therapeutic intervention in order to improve the following deficits and impairments:  Dizziness, Decreased balance  Visit Diagnosis: BPPV (benign paroxysmal positional vertigo), left - Plan: PT plan of care cert/re-cert      G-Codes - [id number]    Functional Assessment Tool Used (+) L Dix-Hallpike test   Functional Limitation Changing and maintaining body position   Changing and Maintaining Body Position Current Status [id number]) At least 20 percent but less than 40 percent impaired, limited or restricted   Changing and Maintaining Body Position Goal  Status [id number]) At least 1 percent but less than 20 percent impaired, limited or restricted       Problem List Patient Active Problem List   Diagnosis Date Noted  . Precordial chest pain 08/27/2015  . 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 Nonspecific abnormal electrocardiogram (ECG) (EKG) 08/27/2015  . BPPV (benign  paroxysmal positional vertigo) 08/27/2015  . Gastroesophageal reflux disease with esophagitis 12/10/2014  . Moderate osteopenia 12/10/2014  . Vitamin B12 deficiency 12/10/2014  . OA (osteoarthritis) of knee 12/16/2013  . Hyperglycemia 12/03/2013  . Other screening mammogram 09/25/2012  . DJD (degenerative joint disease) of knee 09/09/2011  . OSA (obstructive sleep apnea) 07/29/2011  . Essential hypertension, benign 07/29/2011  . Pure hypercholesterolemia 07/29/2011  . Routine general medical examination at a health care facility 07/29/2011    Zlatan Hornback, [place], PT 09/01/2015, 7:07 PM  [facility] [address] [place], [place], [address] Phone: [phone]   Fax:  [phone]  Name: Kelsey Church MRN: [id number] Date of Birth: May 13, 1950

## 2015-09-04 ENCOUNTER — Ambulatory Visit: Payer: BLUE CROSS/BLUE SHIELD | Admitting: Podiatry

## 2015-09-07 ENCOUNTER — Ambulatory Visit (HOSPITAL_COMMUNITY): Payer: Medicare Other

## 2015-09-08 ENCOUNTER — Ambulatory Visit (HOSPITAL_COMMUNITY): Payer: Medicare Other

## 2015-09-28 ENCOUNTER — Ambulatory Visit: Payer: Medicare Other | Admitting: Physical Therapy

## 2015-09-29 ENCOUNTER — Encounter: Payer: Self-pay | Admitting: Internal Medicine

## 2015-09-29 ENCOUNTER — Ambulatory Visit (INDEPENDENT_AMBULATORY_CARE_PROVIDER_SITE_OTHER): Payer: Medicare Other | Admitting: Internal Medicine

## 2015-09-29 VITALS — BP 124/70 | HR 69 | Temp 98.3°F | Ht 66.0 in | Wt 189.0 lb

## 2015-09-29 DIAGNOSIS — I10 Essential (primary) hypertension: Secondary | ICD-10-CM

## 2015-09-29 DIAGNOSIS — R002 Palpitations: Secondary | ICD-10-CM

## 2015-09-29 NOTE — Progress Notes (Signed)
Subjective:  Patient ID: Kelsey Church, female    DOB: [birth year]  Age: 65 y.o. MRN: [id number]  CC: Palpitations   HPI Akayla S Ales presents for an episode of palpitations that occurred one week prior to this visit. She  was walking and had episode where she felt like her heart was racing. She sat down and said the symptoms quickly resolved. She did not experience chest pain/shortness of breath/DOE/lightheadedness/dizziness/edema/ornear syncope. The last time I saw her she was experiencing chest pain and so she underwent an ischemic workup which was unremarkable.  Outpatient Medications Prior to Visit  Medication Sig Dispense Refill  . citalopram (CELEXA) 20 MG tablet TAKE 1 TABLET (20 MG TOTAL) BY MOUTH AT BEDTIME. 90 tablet 3  . losartan-hydrochlorothiazide (HYZAAR) 100-25 MG tablet Take 1 tablet by mouth daily. 90 tablet 3  . meloxicam (MOBIC) 15 MG tablet TAKE 1 TABLET BY MOUTH ONCE DAILY 90 tablet 1  . PREMPRO 0.3-1.5 MG per tablet   3  . traMADol (ULTRAM) 50 MG tablet Take 1 tablet (50 mg total) by mouth every 8 (eight) hours as needed. for pain 65 tablet 3   No facility-administered medications prior to visit.     ROS Review of Systems  Constitutional: Negative.  Negative for appetite change, diaphoresis, fatigue and unexpected weight change.  HENT: Negative.  Negative for trouble swallowing and voice change.   Eyes: Negative.   Respiratory: Negative for cough, choking, chest tightness, shortness of breath, wheezing and stridor.   Cardiovascular: Positive for palpitations. Negative for chest pain and leg swelling.  Gastrointestinal: Negative.  Negative for abdominal pain, constipation, diarrhea, nausea and vomiting.  Endocrine: Negative.   Genitourinary: Negative.   Musculoskeletal: Negative.  Negative for back pain and neck pain.  Skin: Negative.   Allergic/Immunologic: Negative.   Neurological: Negative.  Negative for dizziness, tremors, weakness, light-headedness,  numbness and headaches.  Hematological: Negative.  Negative for adenopathy. Does not bruise/bleed easily.  Psychiatric/Behavioral: Negative.     Objective:  BP 124/70 (BP Location: Left Arm, Patient Position: Sitting, Cuff Size: Normal)   Pulse 69   Temp 98.3 F (36.8 C) (Oral)   Ht 5\' 6"  (1.676 m)   Wt 189 lb (85.7 kg)   SpO2 96%   BMI 30.51 kg/m   BP Readings from Last 3 Encounters:  09/29/15 124/70  08/27/15 128/78  05/28/15 128/80    Wt Readings from Last 3 Encounters:  09/29/15 189 lb (85.7 kg)  08/27/15 188 lb (85.3 kg)  05/28/15 189 lb (85.7 kg)    Physical Exam  Constitutional: She is oriented to person, place, and time. No distress.  HENT:  Mouth/Throat: Oropharynx is clear and moist. No oropharyngeal exudate.  Eyes: Conjunctivae are normal. Right eye exhibits no discharge. Left eye exhibits no discharge. No scleral icterus.  Neck: Normal range of motion. Neck supple. No JVD present. No tracheal deviation present. No thyromegaly present.  Cardiovascular: Normal rate, regular rhythm, normal heart sounds and intact distal pulses.  Exam reveals no gallop and no friction rub.   No murmur heard. EKG ----  Sinus  Rhythm  WITHIN NORMAL LIMITS   Pulmonary/Chest: Effort normal and breath sounds normal. No stridor. No respiratory distress. She has no wheezes. She has no rales. She exhibits no tenderness.  Abdominal: Bowel sounds are normal. She exhibits no distension and no mass. There is no tenderness. There is no rebound and no guarding.  Musculoskeletal: Normal range of motion. She exhibits no edema, tenderness or deformity.  Lymphadenopathy:    She has no cervical adenopathy.  Neurological: She is oriented to person, place, and time.  Skin: Skin is warm and dry. No rash noted. She is not diaphoretic. No erythema. No pallor.  Vitals reviewed.   Lab Results  Component Value Date   WBC 10.0 08/27/2015   HGB 13.9 08/27/2015   HCT 42.3 08/27/2015   PLT 267.0  08/27/2015   GLUCOSE 85 08/27/2015   CHOL 183 12/10/2014   TRIG 140.0 12/10/2014   HDL 54.60 12/10/2014   LDLCALC 100 (H) 12/10/2014   ALT 20 08/27/2015   AST 15 08/27/2015   NA 139 08/27/2015   K 4.3 08/27/2015   CL 101 08/27/2015   CREATININE 1.23 (H) 08/27/2015   BUN 35 (H) 08/27/2015   CO2 28 08/27/2015   TSH 1.20 12/10/2014   INR 1.17 12/27/2013   HGBA1C 6.0 12/10/2014    08/31/15  Nuclear stress EF: 58%.  There was no ST segment deviation noted during stress.  The study is normal.  This is a low risk study.  The left ventricular ejection fraction is normal (55-65%).   Normal pharmacologic nuclear stress test with no evidence of prior infarct or ischemia.   No results found.  Assessment & Plan:   Elaia was seen today for palpitations.  Diagnoses and all orders for this visit:  Essential hypertension, benign- her blood pressure is well-controlled  Palpitations- she has had one episode of brief tachypalpitations with no alarm features, her EKG today is normal, I've asked her to undergo an event monitor to screen for SVT, atrial fibrillation, tachybradycardia syndrome, atrial node disorder, etc. In the meantime she will let me know if she develops any new symptoms. -     EKG 12-Lead -     Cardiac event monitor; Future   I am having Ms. Dunivan maintain her PREMPRO, traMADol, losartan-hydrochlorothiazide, meloxicam, and citalopram.  No orders of the defined types were placed in this encounter.    Follow-up: Return in about 3 months (around 12/30/2015).  [name], MD

## 2015-09-29 NOTE — Progress Notes (Signed)
Pre visit review using our clinic review tool, if applicable. No additional management support is needed unless otherwise documented below in the visit note.   g

## 2015-09-29 NOTE — Patient Instructions (Signed)

## 2015-10-01 ENCOUNTER — Telehealth: Payer: Self-pay | Admitting: Family Medicine

## 2015-10-01 NOTE — Telephone Encounter (Signed)
Patient is having shoulder pain.  She was wanting to see Dr. Tamala Julian today.  I have scheduled her with Marya Amsler for tomorrow 8/17.  Patient would like a call if Dr. Tamala Julian gets anything opened today.

## 2015-10-02 ENCOUNTER — Encounter: Payer: Self-pay | Admitting: Family

## 2015-10-02 ENCOUNTER — Ambulatory Visit (INDEPENDENT_AMBULATORY_CARE_PROVIDER_SITE_OTHER): Payer: Medicare Other | Admitting: Family

## 2015-10-02 VITALS — BP 120/72 | HR 78 | Temp 98.0°F | Resp 16 | Ht 66.0 in | Wt 191.0 lb

## 2015-10-02 DIAGNOSIS — M7551 Bursitis of right shoulder: Secondary | ICD-10-CM

## 2015-10-02 MED ORDER — DICLOFENAC SODIUM 2 % TD SOLN: 1.0000 "application " | Freq: Two times a day (BID) | TRANSDERMAL | 0 refills | Status: DC | PRN

## 2015-10-02 NOTE — Progress Notes (Signed)
Subjective:    Patient ID: Kelsey Church, female    DOB: 04-Apr-1950, 65 y.o.   MRN: [id number]  Chief Complaint  Patient presents with  . Shoulder Pain    does an exercise class and had to get on a mat for one of the exercises, when getting up she put weight on her arm and landed on her right shoulder    HPI:  Kelsey Church is a 65 y.o. female who  has a past medical history of Arthritis; Cancer ([place]); Complication of anesthesia; Depression; Dysphagia; GERD (gastroesophageal reflux disease); History of shingles; Hypertension; Melanoma in situ ([place]); Pain; Sleep apnea (09/2011); and Sleep apnea. and presents today for an office visit.   This is a new problem. Associated symptom of pain located in her right shoulder has been going on for approximately 1 week ago following landing on her right shoulder when attempting to get up during an exercise class. She has previous history of shoulder tendinitis. Pain is described as constant ache with timing of symptoms worse at night. Modifying factors include ibuprofen and ice which helped with her symptoms. Does have a little numbness/tingling in her upper arm. Mild neck discomfort as well. Previous history of bursitis and tendinopathy that was improved.   No Known Allergies   Current Outpatient Prescriptions on File Prior to Visit  Medication Sig Dispense Refill  . citalopram (CELEXA) 20 MG tablet TAKE 1 TABLET (20 MG TOTAL) BY MOUTH AT BEDTIME. 90 tablet 3  . losartan-hydrochlorothiazide (HYZAAR) 100-25 MG tablet Take 1 tablet by mouth daily. 90 tablet 3  . meloxicam (MOBIC) 15 MG tablet TAKE 1 TABLET BY MOUTH ONCE DAILY 90 tablet 1  . PREMPRO 0.3-1.5 MG per tablet   3  . traMADol (ULTRAM) 50 MG tablet Take 1 tablet (50 mg total) by mouth every 8 (eight) hours as needed. for pain 65 tablet 3   No current facility-administered medications on file prior to visit.      Past Surgical History:  Procedure Laterality Date  . APPENDECTOMY   1958  . COLONOSCOPY    . COLONOSCOPY W/ POLYPECTOMY     7 polyps  . FOOT SURGERY Right    removal plantar wart  . GALLBLADDER SURGERY  02/14/1990  . INJECTION KNEE  09/2011   right   . KNEE ARTHROSCOPY  1996  . MELANOMA EXCISION  1992   right chin, back  . POLYPECTOMY    . SALPINGOOPHORECTOMY  2009   Right  . TOTAL KNEE ARTHROPLASTY Right 06/10/2013   Procedure: RIGHT TOTAL KNEE ARTHROPLASTY;  Surgeon: [name], MD;  Location: [facility];  Service: Orthopedics;  Laterality: Right;  . TOTAL KNEE ARTHROPLASTY Left 12/16/2013   Procedure: LEFT TOTAL KNEE ARTHROPLASTY;  Surgeon: [name], MD;  Location: [facility];  Service: Orthopedics;  Laterality: Left;    Past Medical History:  Diagnosis Date  . Arthritis    PAIN AND OA LEFT KNEE; S/P RIGHT TOTAL KNEE ARTHROPLASTY 06/10/13  . Cancer ([place])    melanoma in 2 sights  . Complication of anesthesia    "chills every evening at sundown x 2 weeks"- no fever  . Depression   . Dysphagia   . GERD (gastroesophageal reflux disease)   . History of shingles    NO RESIDUAL PROBLEMS  . Hypertension   . Melanoma in situ ([place])   . Pain    LOWER BACK PAIN  . Sleep apnea 09/2011   CPAP  . Sleep apnea  Review of Systems  Constitutional: Negative for chills and fever.  Musculoskeletal:       Positive for right shoulder pain.  Neurological: Negative for weakness and numbness.      Objective:    BP 120/72 (BP Location: Left Arm, Patient Position: Sitting, Cuff Size: Normal)   Pulse 78   Temp 98 F (36.7 C) (Oral)   Resp 16   Ht 5\' 6"  (1.676 m)   Wt 191 lb (86.6 kg)   SpO2 96%   BMI 30.83 kg/m  Nursing note and vital signs reviewed.  Physical Exam  Constitutional: She is oriented to person, place, and time. She appears well-developed and well-nourished. No distress.  Cardiovascular: Normal rate, regular rhythm, normal heart sounds and intact distal pulses.   Pulmonary/Chest: Effort normal and breath sounds normal.    Musculoskeletal:  Right shoulder - no obvious deformity, discoloration, or edema. Mild tenderness elicited over subacromial space and posterior rotator cuff. There is upper trapezius tenderness as well. Range of motion is within normal limits with discomfort noted toward end range of of flexion and abduction. Strength is 4+. Distal pulses and sensation are intact and appropriate. Negative apprehension; positive empty can; negative Neer's impingement; negative Michel Bickers  Neurological: She is alert and oriented to person, place, and time.  Skin: Skin is warm and dry.  Psychiatric: She has a normal mood and affect. Her behavior is normal. Judgment and thought content normal.       Assessment & Plan:   Problem List Items Addressed This Visit      Musculoskeletal and Integument   Bursitis of right shoulder - Primary    Symptoms and exam consistent with tendinitis/bursitis of the right shoulder following landing on it. Treat conservatively with ice, home exercise therapy, Pennsaid and continue current dosage of meloxicam. If symptoms are not improving consider cortisone injection.      Relevant Medications   Diclofenac Sodium (PENNSAID) 2 % SOLN    Other Visit Diagnoses   None.     I am having Ms. Burkley start on Diclofenac Sodium. I am also having her maintain her PREMPRO, traMADol, losartan-hydrochlorothiazide, meloxicam, and citalopram.   Meds ordered this encounter  Medications  . Diclofenac Sodium (PENNSAID) 2 % SOLN    Sig: Place 1 application onto the skin 2 (two) times daily as needed.    Dispense:  112 g    Refill:  0    Order Specific Question:   Supervising Provider    Answer:   Pricilla Holm A [id number]     Follow-up: Return if symptoms worsen or fail to improve.  Mauricio Po, FNP

## 2015-10-02 NOTE — Telephone Encounter (Signed)
Dr Tamala Julian did not have any openings. Pt will need to see Terri Piedra.

## 2015-10-02 NOTE — Patient Instructions (Addendum)
Thank you for choosing [facility].  Summary/Instructions:   Ice for 20 minutes every 2 hours and after activity.  Pennsaid 2x per day to the affected area.  Your prescription(s) have been submitted to your pharmacy or been printed and provided for you. Please take as directed and contact our office if you believe you are having problem(s) with the medication(s) or have any questions.  If your symptoms worsen or fail to improve, please contact our office for further instruction, or in case of emergency go directly to the emergency room at the closest medical facility.     Impingement Syndrome, Rotator Cuff, Bursitis With Rehab Impingement syndrome is a condition that involves inflammation of the tendons of the rotator cuff and the subacromial bursa, that causes pain in the shoulder. The rotator cuff consists of four tendons and muscles that control much of the shoulder and upper arm function. The subacromial bursa is a fluid filled sac that helps reduce friction between the rotator cuff and one of the bones of the shoulder (acromion). Impingement syndrome is usually an overuse injury that causes swelling of the bursa (bursitis), swelling of the tendon (tendonitis), and/or a tear of the tendon (strain). Strains are classified into three categories. Grade 1 strains cause pain, but the tendon is not lengthened. Grade 2 strains include a lengthened ligament, due to the ligament being stretched or partially ruptured. With grade 2 strains there is still function, although the function may be decreased. Grade 3 strains include a complete tear of the tendon or muscle, and function is usually impaired. SYMPTOMS   Pain around the shoulder, often at the outer portion of the upper arm.  Pain that gets worse with shoulder function, especially when reaching overhead or lifting.  Sometimes, aching when not using the arm.  Pain that wakes you up at night.  Sometimes, tenderness, swelling, warmth,  or redness over the affected area.  Loss of strength.  Limited motion of the shoulder, especially reaching behind the back (to the back pocket or to unhook bra) or across your body.  Crackling sound (crepitation) when moving the arm.  Biceps tendon pain and inflammation (in the front of the shoulder). Worse when bending the elbow or lifting. CAUSES  Impingement syndrome is often an overuse injury, in which chronic (repetitive) motions cause the tendons or bursa to become inflamed. A strain occurs when a force is paced on the tendon or muscle that is greater than it can withstand. Common mechanisms of injury include: Stress from sudden increase in duration, frequency, or intensity of training.  Direct hit (trauma) to the shoulder.  Aging, erosion of the tendon with normal use.  Bony bump on shoulder (acromial spur). RISK INCREASES WITH:  Contact sports (football, wrestling, boxing).  Throwing sports (baseball, tennis, volleyball).  Weightlifting and bodybuilding.  Heavy labor.  Previous injury to the rotator cuff, including impingement.  Poor shoulder strength and flexibility.  Failure to warm up properly before activity.  Inadequate protective equipment.  Old age.  Bony bump on shoulder (acromial spur). PREVENTION   Warm up and stretch properly before activity.  Allow for adequate recovery between workouts.  Maintain physical fitness:  Strength, flexibility, and endurance.  Cardiovascular fitness.  Learn and use proper exercise technique. PROGNOSIS  If treated properly, impingement syndrome usually goes away within 6 weeks. Sometimes surgery is required.  RELATED COMPLICATIONS   Longer healing time if not properly treated, or if not given enough time to heal.  Recurring symptoms, that result in  a chronic condition.  Shoulder stiffness, frozen shoulder, or loss of motion.  Rotator cuff tendon tear.  Recurring symptoms, especially if activity is resumed  too soon, with overuse, with a direct blow, or when using poor technique. TREATMENT  Treatment first involves the use of ice and medicine, to reduce pain and inflammation. The use of strengthening and stretching exercises may help reduce pain with activity. These exercises may be performed at home or with a therapist. If non-surgical treatment is unsuccessful after more than 6 months, surgery may be advised. After surgery and rehabilitation, activity is usually possible in 3 months.  MEDICATION  If pain medicine is needed, nonsteroidal anti-inflammatory medicines (aspirin and ibuprofen), or other minor pain relievers (acetaminophen), are often advised.  Do not take pain medicine for 7 days before surgery.  Prescription pain relievers may be given, if your caregiver thinks they are needed. Use only as directed and only as much as you need.  Corticosteroid injections may be given by your caregiver. These injections should be reserved for the most serious cases, because they may only be given a certain number of times. HEAT AND COLD  Cold treatment (icing) should be applied for 10 to 15 minutes every 2 to 3 hours for inflammation and pain, and immediately after activity that aggravates your symptoms. Use ice packs or an ice massage.  Heat treatment may be used before performing stretching and strengthening activities prescribed by your caregiver, physical therapist, or athletic trainer. Use a heat pack or a warm water soak. SEEK MEDICAL CARE IF:   Symptoms get worse or do not improve in 4 to 6 weeks, despite treatment.  New, unexplained symptoms develop. (Drugs used in treatment may produce side effects.) EXERCISES  RANGE OF MOTION (ROM) AND STRETCHING EXERCISES - Impingement Syndrome (Rotator Cuff  Tendinitis, Bursitis) These exercises may help you when beginning to rehabilitate your injury. Your symptoms may go away with or without further involvement from your physician, physical therapist  or athletic trainer. While completing these exercises, remember:   Restoring tissue flexibility helps normal motion to return to the joints. This allows healthier, less painful movement and activity.  An effective stretch should be held for at least 30 seconds.  A stretch should never be painful. You should only feel a gentle lengthening or release in the stretched tissue. STRETCH - Flexion, Standing  Stand with good posture. With an underhand grip on your right / left hand, and an overhand grip on the opposite hand, grasp a broomstick or cane so that your hands are a little more than shoulder width apart.  Keeping your right / left elbow straight and shoulder muscles relaxed, push the stick with your opposite hand, to raise your right / left arm in front of your body and then overhead. Raise your arm until you feel a stretch in your right / left shoulder, but before you have increased shoulder pain.  Try to avoid shrugging your right / left shoulder as your arm rises, by keeping your shoulder blade tucked down and toward your mid-back spine. Hold for __________ seconds.  Slowly return to the starting position. Repeat __________ times. Complete this exercise __________ times per day. STRETCH - Abduction, Supine  Lie on your back. With an underhand grip on your right / left hand and an overhand grip on the opposite hand, grasp a broomstick or cane so that your hands are a little more than shoulder width apart.  Keeping your right / left elbow straight and your  shoulder muscles relaxed, push the stick with your opposite hand, to raise your right / left arm out to the side of your body and then overhead. Raise your arm until you feel a stretch in your right / left shoulder, but before you have increased shoulder pain.  Try to avoid shrugging your right / left shoulder as your arm rises, by keeping your shoulder blade tucked down and toward your mid-back spine. Hold for __________  seconds.  Slowly return to the starting position. Repeat __________ times. Complete this exercise __________ times per day. ROM - Flexion, Active-Assisted  Lie on your back. You may bend your knees for comfort.  Grasp a broomstick or cane so your hands are about shoulder width apart. Your right / left hand should grip the end of the stick, so that your hand is positioned "thumbs-up," as if you were about to shake hands.  Using your healthy arm to lead, raise your right / left arm overhead, until you feel a gentle stretch in your shoulder. Hold for __________ seconds.  Use the stick to assist in returning your right / left arm to its starting position. Repeat __________ times. Complete this exercise __________ times per day.  ROM - Internal Rotation, Supine   Lie on your back on a firm surface. Place your right / left elbow about 60 degrees away from your side. Elevate your elbow with a folded towel, so that the elbow and shoulder are the same height.  Using a broomstick or cane and your strong arm, pull your right / left hand toward your body until you feel a gentle stretch, but no increase in your shoulder pain. Keep your shoulder and elbow in place throughout the exercise.  Hold for __________ seconds. Slowly return to the starting position. Repeat __________ times. Complete this exercise __________ times per day. STRETCH - Internal Rotation  Place your right / left hand behind your back, palm up.  Throw a towel or belt over your opposite shoulder. Grasp the towel with your right / left hand.  While keeping an upright posture, gently pull up on the towel, until you feel a stretch in the front of your right / left shoulder.  Avoid shrugging your right / left shoulder as your arm rises, by keeping your shoulder blade tucked down and toward your mid-back spine.  Hold for __________ seconds. Release the stretch, by lowering your healthy hand. Repeat __________ times. Complete this  exercise __________ times per day. ROM - Internal Rotation   Using an underhand grip, grasp a stick behind your back with both hands.  While standing upright with good posture, slide the stick up your back until you feel a mild stretch in the front of your shoulder.  Hold for __________ seconds. Slowly return to your starting position. Repeat __________ times. Complete this exercise __________ times per day.  STRETCH - Posterior Shoulder Capsule   Stand or sit with good posture. Grasp your right / left elbow and draw it across your chest, keeping it at the same height as your shoulder.  Pull your elbow, so your upper arm comes in closer to your chest. Pull until you feel a gentle stretch in the back of your shoulder.  Hold for __________ seconds. Repeat __________ times. Complete this exercise __________ times per day. STRENGTHENING EXERCISES - Impingement Syndrome (Rotator Cuff Tendinitis, Bursitis) These exercises may help you when beginning to rehabilitate your injury. They may resolve your symptoms with or without further involvement from your physician,  physical therapist or athletic trainer. While completing these exercises, remember:  Muscles can gain both the endurance and the strength needed for everyday activities through controlled exercises.  Complete these exercises as instructed by your physician, physical therapist or athletic trainer. Increase the resistance and repetitions only as guided.  You may experience muscle soreness or fatigue, but the pain or discomfort you are trying to eliminate should never worsen during these exercises. If this pain does get worse, stop and make sure you are following the directions exactly. If the pain is still present after adjustments, discontinue the exercise until you can discuss the trouble with your clinician.  During your recovery, avoid activity or exercises which involve actions that place your injured hand or elbow above your head or  behind your back or head. These positions stress the tissues which you are trying to heal. STRENGTH - Scapular Depression and Adduction   With good posture, sit on a firm chair. Support your arms in front of you, with pillows, arm rests, or on a table top. Have your elbows in line with the sides of your body.  Gently draw your shoulder blades down and toward your mid-back spine. Gradually increase the tension, without tensing the muscles along the top of your shoulders and the back of your neck.  Hold for __________ seconds. Slowly release the tension and relax your muscles completely before starting the next repetition.  After you have practiced this exercise, remove the arm support and complete the exercise in standing as well as sitting position. Repeat __________ times. Complete this exercise __________ times per day.  STRENGTH - Shoulder Abductors, Isometric  With good posture, stand or sit about 4-6 inches from a wall, with your right / left side facing the wall.  Bend your right / left elbow. Gently press your right / left elbow into the wall. Increase the pressure gradually, until you are pressing as hard as you can, without shrugging your shoulder or increasing any shoulder discomfort.  Hold for __________ seconds.  Release the tension slowly. Relax your shoulder muscles completely before you begin the next repetition. Repeat __________ times. Complete this exercise __________ times per day.  STRENGTH - External Rotators, Isometric  Keep your right / left elbow at your side and bend it 90 degrees.  Step into a door frame so that the outside of your right / left wrist can press against the door frame without your upper arm leaving your side.  Gently press your right / left wrist into the door frame, as if you were trying to swing the back of your hand away from your stomach. Gradually increase the tension, until you are pressing as hard as you can, without shrugging your shoulder  or increasing any shoulder discomfort.  Hold for __________ seconds.  Release the tension slowly. Relax your shoulder muscles completely before you begin the next repetition. Repeat __________ times. Complete this exercise __________ times per day.  STRENGTH - Supraspinatus   Stand or sit with good posture. Grasp a __________ weight, or an exercise band or tubing, so that your hand is "thumbs-up," like you are shaking hands.  Slowly lift your right / left arm in a "V" away from your thigh, diagonally into the space between your side and straight ahead. Lift your hand to shoulder height or as far as you can, without increasing any shoulder pain. At first, many people do not lift their hands above shoulder height.  Avoid shrugging your right / left shoulder as  your arm rises, by keeping your shoulder blade tucked down and toward your mid-back spine.  Hold for __________ seconds. Control the descent of your hand, as you slowly return to your starting position. Repeat __________ times. Complete this exercise __________ times per day.  STRENGTH - External Rotators  Secure a rubber exercise band or tubing to a fixed object (table, pole) so that it is at the same height as your right / left elbow when you are standing or sitting on a firm surface.  Stand or sit so that the secured exercise band is at your uninjured side.  Bend your right / left elbow 90 degrees. Place a folded towel or small pillow under your right / left arm, so that your elbow is a few inches away from your side.  Keeping the tension on the exercise band, pull it away from your body, as if pivoting on your elbow. Be sure to keep your body steady, so that the movement is coming only from your rotating shoulder.  Hold for __________ seconds. Release the tension in a controlled manner, as you return to the starting position. Repeat __________ times. Complete this exercise __________ times per day.  STRENGTH - Internal Rotators    Secure a rubber exercise band or tubing to a fixed object (table, pole) so that it is at the same height as your right / left elbow when you are standing or sitting on a firm surface.  Stand or sit so that the secured exercise band is at your right / left side.  Bend your elbow 90 degrees. Place a folded towel or small pillow under your right / left arm so that your elbow is a few inches away from your side.  Keeping the tension on the exercise band, pull it across your body, toward your stomach. Be sure to keep your body steady, so that the movement is coming only from your rotating shoulder.  Hold for __________ seconds. Release the tension in a controlled manner, as you return to the starting position. Repeat __________ times. Complete this exercise __________ times per day.  STRENGTH - Scapular Protractors, Standing   Stand arms length away from a wall. Place your hands on the wall, keeping your elbows straight.  Begin by dropping your shoulder blades down and toward your mid-back spine.  To strengthen your protractors, keep your shoulder blades down, but slide them forward on your rib cage. It will feel as if you are lifting the back of your rib cage away from the wall. This is a subtle motion and can be challenging to complete. Ask your caregiver for further instruction, if you are not sure you are doing the exercise correctly.  Hold for __________ seconds. Slowly return to the starting position, resting the muscles completely before starting the next repetition. Repeat __________ times. Complete this exercise __________ times per day. STRENGTH - Scapular Protractors, Supine  Lie on your back on a firm surface. Extend your right / left arm straight into the air while holding a __________ weight in your hand.  Keeping your head and back in place, lift your shoulder off the floor.  Hold for __________ seconds. Slowly return to the starting position, and allow your muscles to relax  completely before starting the next repetition. Repeat __________ times. Complete this exercise __________ times per day. STRENGTH - Scapular Protractors, Quadruped  Get onto your hands and knees, with your shoulders directly over your hands (or as close as you can be, comfortably).  Keeping  your elbows locked, lift the back of your rib cage up into your shoulder blades, so your mid-back rounds out. Keep your neck muscles relaxed.  Hold this position for __________ seconds. Slowly return to the starting position and allow your muscles to relax completely before starting the next repetition. Repeat __________ times. Complete this exercise __________ times per day.  STRENGTH - Scapular Retractors  Secure a rubber exercise band or tubing to a fixed object (table, pole), so that it is at the height of your shoulders when you are either standing, or sitting on a firm armless chair.  With a palm down grip, grasp an end of the band in each hand. Straighten your elbows and lift your hands straight in front of you, at shoulder height. Step back, away from the secured end of the band, until it becomes tense.  Squeezing your shoulder blades together, draw your elbows back toward your sides, as you bend them. Keep your upper arms lifted away from your body throughout the exercise.  Hold for __________ seconds. Slowly ease the tension on the band, as you reverse the directions and return to the starting position. Repeat __________ times. Complete this exercise __________ times per day. STRENGTH - Shoulder Extensors   Secure a rubber exercise band or tubing to a fixed object (table, pole) so that it is at the height of your shoulders when you are either standing, or sitting on a firm armless chair.  With a thumbs-up grip, grasp an end of the band in each hand. Straighten your elbows and lift your hands straight in front of you, at shoulder height. Step back, away from the secured end of the band, until it  becomes tense.  Squeezing your shoulder blades together, pull your hands down to the sides of your thighs. Do not allow your hands to go behind you.  Hold for __________ seconds. Slowly ease the tension on the band, as you reverse the directions and return to the starting position. Repeat __________ times. Complete this exercise __________ times per day.  STRENGTH - Scapular Retractors and External Rotators   Secure a rubber exercise band or tubing to a fixed object (table, pole) so that it is at the height as your shoulders, when you are either standing, or sitting on a firm armless chair.  With a palm down grip, grasp an end of the band in each hand. Bend your elbows 90 degrees and lift your elbows to shoulder height, at your sides. Step back, away from the secured end of the band, until it becomes tense.  Squeezing your shoulder blades together, rotate your shoulders so that your upper arms and elbows remain stationary, but your fists travel upward to head height.  Hold for __________ seconds. Slowly ease the tension on the band, as you reverse the directions and return to the starting position. Repeat __________ times. Complete this exercise __________ times per day.  STRENGTH - Scapular Retractors and External Rotators, Rowing   Secure a rubber exercise band or tubing to a fixed object (table, pole) so that it is at the height of your shoulders, when you are either standing, or sitting on a firm armless chair.  With a palm down grip, grasp an end of the band in each hand. Straighten your elbows and lift your hands straight in front of you, at shoulder height. Step back, away from the secured end of the band, until it becomes tense.  Step 1: Squeeze your shoulder blades together. Bending your elbows, draw  your hands to your chest, as if you are rowing a boat. At the end of this motion, your hands and elbow should be at shoulder height and your elbows should be out to your sides.  Step 2:  Rotate your shoulders, to raise your hands above your head. Your forearms should be vertical and your upper arms should be horizontal.  Hold for __________ seconds. Slowly ease the tension on the band, as you reverse the directions and return to the starting position. Repeat __________ times. Complete this exercise __________ times per day.  STRENGTH - Scapular Depressors  Find a sturdy chair without wheels, such as a dining room chair.  Keeping your feet on the floor, and your hands on the chair arms, lift your bottom up from the seat, and lock your elbows.  Keeping your elbows straight, allow gravity to pull your body weight down. Your shoulders will rise toward your ears.  Raise your body against gravity by drawing your shoulder blades down your back, shortening the distance between your shoulders and ears. Although your feet should always maintain contact with the floor, your feet should progressively support less body weight, as you get stronger.  Hold for __________ seconds. In a controlled and slow manner, lower your body weight to begin the next repetition. Repeat __________ times. Complete this exercise __________ times per day.    This information is not intended to replace advice given to you by your health care provider. Make sure you discuss any questions you have with your health care provider.   Document Released: 01/31/2005 Document Revised: 02/21/2014 Document Reviewed: 05/15/2008 Elsevier Interactive Patient Education [employer].

## 2015-10-02 NOTE — Assessment & Plan Note (Signed)
Symptoms and exam consistent with tendinitis/bursitis of the right shoulder following landing on it. Treat conservatively with ice, home exercise therapy, Pennsaid and continue current dosage of meloxicam. If symptoms are not improving consider cortisone injection.

## 2015-10-18 DIAGNOSIS — Z23 Encounter for immunization: Secondary | ICD-10-CM | POA: Diagnosis not present

## 2015-10-29 ENCOUNTER — Telehealth: Payer: Self-pay | Admitting: Cardiology

## 2015-10-29 NOTE — Telephone Encounter (Signed)
Unable to reach pt to schedule monitor appt. I have attempted several times with no answer and no call back. I have removed the order from the work que at this time.   10/29/2015 no answer no voicemail.. unable to reach pt. stpegram  10/23/2015 unable to reach pt. no answer no voicemail. stpegram  10/02/2015 no voicemail no answer. stpegram  sonya

## 2015-11-09 DIAGNOSIS — M5136 Other intervertebral disc degeneration, lumbar region: Secondary | ICD-10-CM | POA: Diagnosis not present

## 2015-11-09 DIAGNOSIS — M5137 Other intervertebral disc degeneration, lumbosacral region: Secondary | ICD-10-CM | POA: Diagnosis not present

## 2015-11-09 DIAGNOSIS — M531 Cervicobrachial syndrome: Secondary | ICD-10-CM | POA: Diagnosis not present

## 2015-11-09 DIAGNOSIS — M9903 Segmental and somatic dysfunction of lumbar region: Secondary | ICD-10-CM | POA: Diagnosis not present

## 2015-11-09 DIAGNOSIS — M9904 Segmental and somatic dysfunction of sacral region: Secondary | ICD-10-CM | POA: Diagnosis not present

## 2015-11-09 DIAGNOSIS — M9901 Segmental and somatic dysfunction of cervical region: Secondary | ICD-10-CM | POA: Diagnosis not present

## 2015-11-19 ENCOUNTER — Telehealth: Payer: Self-pay | Admitting: Emergency Medicine

## 2015-11-19 NOTE — Telephone Encounter (Signed)
How long will she be out of the country?

## 2015-11-19 NOTE — Telephone Encounter (Signed)
Pt is leaving to go out of the country and wants to know if she can get a [place]? If so Pharmacy is [facility] [employer].Please advise thanks.

## 2015-11-19 NOTE — Telephone Encounter (Signed)
Please advise 

## 2015-11-23 ENCOUNTER — Other Ambulatory Visit: Payer: Self-pay | Admitting: Internal Medicine

## 2015-11-23 MED ORDER — ATOVAQUONE-PROGUANIL HCL 250-100 MG PO TABS: 1.0000 | ORAL_TABLET | Freq: Every day | ORAL | 0 refills | Status: DC

## 2015-11-26 DIAGNOSIS — M2042 Other hammer toe(s) (acquired), left foot: Secondary | ICD-10-CM | POA: Diagnosis not present

## 2015-11-26 DIAGNOSIS — M7742 Metatarsalgia, left foot: Secondary | ICD-10-CM | POA: Diagnosis not present

## 2015-11-26 DIAGNOSIS — B353 Tinea pedis: Secondary | ICD-10-CM | POA: Diagnosis not present

## 2015-11-26 DIAGNOSIS — M2032 Hallux varus (acquired), left foot: Secondary | ICD-10-CM | POA: Diagnosis not present

## 2015-11-30 DIAGNOSIS — M9904 Segmental and somatic dysfunction of sacral region: Secondary | ICD-10-CM | POA: Diagnosis not present

## 2015-11-30 DIAGNOSIS — M5137 Other intervertebral disc degeneration, lumbosacral region: Secondary | ICD-10-CM | POA: Diagnosis not present

## 2015-11-30 DIAGNOSIS — M531 Cervicobrachial syndrome: Secondary | ICD-10-CM | POA: Diagnosis not present

## 2015-11-30 DIAGNOSIS — M5136 Other intervertebral disc degeneration, lumbar region: Secondary | ICD-10-CM | POA: Diagnosis not present

## 2015-11-30 DIAGNOSIS — M9901 Segmental and somatic dysfunction of cervical region: Secondary | ICD-10-CM | POA: Diagnosis not present

## 2015-11-30 DIAGNOSIS — M9903 Segmental and somatic dysfunction of lumbar region: Secondary | ICD-10-CM | POA: Diagnosis not present

## 2015-12-15 DIAGNOSIS — L600 Ingrowing nail: Secondary | ICD-10-CM | POA: Diagnosis not present

## 2015-12-16 DIAGNOSIS — M9901 Segmental and somatic dysfunction of cervical region: Secondary | ICD-10-CM | POA: Diagnosis not present

## 2015-12-16 DIAGNOSIS — M9903 Segmental and somatic dysfunction of lumbar region: Secondary | ICD-10-CM | POA: Diagnosis not present

## 2015-12-16 DIAGNOSIS — M531 Cervicobrachial syndrome: Secondary | ICD-10-CM | POA: Diagnosis not present

## 2015-12-16 DIAGNOSIS — M5136 Other intervertebral disc degeneration, lumbar region: Secondary | ICD-10-CM | POA: Diagnosis not present

## 2015-12-16 DIAGNOSIS — M9904 Segmental and somatic dysfunction of sacral region: Secondary | ICD-10-CM | POA: Diagnosis not present

## 2015-12-16 DIAGNOSIS — M5137 Other intervertebral disc degeneration, lumbosacral region: Secondary | ICD-10-CM | POA: Diagnosis not present

## 2015-12-18 ENCOUNTER — Encounter: Payer: Self-pay | Admitting: Physician Assistant

## 2015-12-18 ENCOUNTER — Telehealth: Payer: Self-pay | Admitting: Internal Medicine

## 2015-12-18 ENCOUNTER — Ambulatory Visit (INDEPENDENT_AMBULATORY_CARE_PROVIDER_SITE_OTHER): Payer: Medicare Other | Admitting: Physician Assistant

## 2015-12-18 VITALS — BP 102/62 | HR 76 | Temp 98.0°F | Resp 16 | Ht 66.0 in | Wt 188.5 lb

## 2015-12-18 DIAGNOSIS — W57XXXA Bitten or stung by nonvenomous insect and other nonvenomous arthropods, initial encounter: Secondary | ICD-10-CM | POA: Diagnosis not present

## 2015-12-18 DIAGNOSIS — R21 Rash and other nonspecific skin eruption: Secondary | ICD-10-CM

## 2015-12-18 MED ORDER — METHYLPREDNISOLONE 4 MG PO TBPK: ORAL_TABLET | ORAL | 0 refills | Status: DC

## 2015-12-18 MED ORDER — HYDRALAZINE HCL 10 MG PO TABS: 10.0000 mg | ORAL_TABLET | Freq: Three times a day (TID) | ORAL | 0 refills | Status: DC

## 2015-12-18 NOTE — Telephone Encounter (Signed)
Medication has been resent to the pharmacy. Please inform patient.

## 2015-12-18 NOTE — Progress Notes (Signed)
Patient presents to clinic today c/o scattered bumps on skin of bilateral upper and lower extremities first noted on Sunday once home from a long plane ride. Patient in [place] over the past week on a medical mission trip. Endorses noting small bite on the R hand on the airplane. Notes since then the areas have multiplied. Notes mild pruritus. Denies pain or drainage. Patient denies fever, rash, nausea or vomiting. Was on antimalarials before leaving on trip, during trip and since getting home. Stopped yesterday due to the bumps as she felt it could be related to medication. Patient also endorses starting Cipro for traveler's diarrhea prevention on Sunday evening.   Past Medical History:  Diagnosis Date  . Arthritis    PAIN AND OA LEFT KNEE; S/P RIGHT TOTAL KNEE ARTHROPLASTY 06/10/13  . Cancer ([place])    melanoma in 2 sights  . Complication of anesthesia    "chills every evening at sundown x 2 weeks"- no fever  . Depression   . Dysphagia   . GERD (gastroesophageal reflux disease)   . History of shingles    NO RESIDUAL PROBLEMS  . Hypertension   . Melanoma in situ ([place])   . Pain    LOWER BACK PAIN  . Sleep apnea 09/2011   CPAP  . Sleep apnea     Current Outpatient Prescriptions on File Prior to Visit  Medication Sig Dispense Refill  . citalopram (CELEXA) 20 MG tablet TAKE 1 TABLET (20 MG TOTAL) BY MOUTH AT BEDTIME. 90 tablet 3  . Diclofenac Sodium (PENNSAID) 2 % SOLN Place 1 application onto the skin 2 (two) times daily as needed. 112 g 0  . losartan-hydrochlorothiazide (HYZAAR) 100-25 MG tablet Take 1 tablet by mouth daily. 90 tablet 3  . meloxicam (MOBIC) 15 MG tablet TAKE 1 TABLET BY MOUTH ONCE DAILY 90 tablet 1  . PREMPRO 0.3-1.5 MG per tablet   3  . atovaquone-proguanil (MALARONE) 250-100 MG TABS tablet Take 1 tablet by mouth daily. (Patient not taking: Reported on 12/18/2015) 21 tablet 0  . traMADol (ULTRAM) 50 MG tablet Take 1 tablet (50 mg total) by mouth every 8 (eight) hours  as needed. for pain (Patient not taking: Reported on 12/18/2015) 65 tablet 3   No current facility-administered medications on file prior to visit.     No Known Allergies  Family History  Problem Relation Age of Onset  . Heart disease Father   . Cancer Maternal Grandmother     Breast and Uterine Cancer  . Osteoporosis Mother   . Hypertension Sister   . Diabetes Sister   . Colon polyps Sister   . Colon cancer Neg Hx   . Rectal cancer Neg Hx   . Stomach cancer Neg Hx   . Esophageal cancer Neg Hx     Social History   Social History  . Marital status: Widowed    Spouse name: N/A  . Number of children: 1  . Years of education: N/A   Occupational History  . [employer]   Social History Main Topics  . Smoking status: Never Smoker  . Smokeless tobacco: Never Used  . Alcohol use No  . Drug use: No  . Sexual activity: Not Currently   Other Topics Concern  . None   Social History Narrative   Caffienated drinks-no   Seat belt use often-yes   Regular Exercise-yes   Smoke alarm in the home-yes   Firearms/guns in the home-no   History of physical abuse-no  Review of Systems - See HPI.  All other ROS are negative.  BP 102/62 (BP Location: Left Arm, Patient Position: Sitting, Cuff Size: Large)   Pulse 76   Temp 98 F (36.7 C) (Oral)   Resp 16   Ht 5\' 6"  (1.676 m)   Wt 188 lb 8 oz (85.5 kg)   SpO2 94%   BMI 30.42 kg/m   Physical Exam  Constitutional: She is oriented to person, place, and time and well-developed, well-nourished, and in no distress.  HENT:  Head: Normocephalic and atraumatic.  Mouth/Throat: Uvula is midline and oropharynx is clear and moist.  Eyes: Conjunctivae are normal.  Neck: Neck supple.  Cardiovascular: Normal rate, regular rhythm, normal heart sounds and intact distal pulses.   Pulmonary/Chest: Effort normal and breath sounds normal. No respiratory distress. She has no wheezes. She has no rales. She exhibits no  tenderness.  Neurological: She is alert and oriented to person, place, and time.  Skin: Skin is warm and dry.  Scattered macules note of bilateral upper and lower extremities. No areas of confluence noted. No evidence of papules or vesicles. Lesions favor ankles and lower legs as well as forearms. NO lesions of abdomen, groin, chest, face or neck. No lesions of palms or soles noted on examination.  Psychiatric: Affect normal.  Vitals reviewed.  Assessment/Plan: 1. Bug bite, initial encounter Lesions are not consistent with a true rash but scattered areas that appear to be bug bite. Giving location question flea. No bites of webbing of hands or inter trigonal areas. No burrowing to suggest scabies infection. Will start medrol pack to calm down reaction to bites. Atarax for itch. Supportive measures reviewed. Discussed wash/care of linens, clothing etc to take care of infestation of things taken on trip. She is to store as many of these items in an air tight container as possible and wash again before using.  Discussed with her that giving her recent foreign travel, she should restart the antimalarial and complete entire course. If she develops and fever, chills, or other constitutional symptoms, she is to return immediately.   Leeanne Rio, PA-C

## 2015-12-18 NOTE — Progress Notes (Signed)
Pre visit review using our clinic review tool, if applicable. No additional management support is needed unless otherwise documented below in the visit note/SLS  

## 2015-12-18 NOTE — Patient Instructions (Addendum)
The area of concerns look like bug bites. I would deep clean all clothing taken on your trip, bed and bath linens. Use warm/hot water. Store what you can in an air-tight container. Re wash in 1 week before using.  Take the steroid pack as directed. This will help with itch and inflammation. You can take Claritin or Benadryl for itch. Over-the-counter Sarna lotion may also be beneficial.  Would restart the [facility] to complete entire course.  If symptoms are not quickly resolving or if you note any fever, chills, please return immediately.

## 2015-12-18 NOTE — Telephone Encounter (Signed)
Patient informed. 

## 2015-12-18 NOTE — Telephone Encounter (Signed)
Caller name: Relationship to patient: Self Can be reached: [phone]  Pharmacy:  [facility], [place] - [address] AT [address] [phone] (Phone) [phone] (Fax)     Reason for call: States she was suppose to have a Rx called in for itching but got to the pharmacy and it was not there. Plse adv

## 2016-01-12 ENCOUNTER — Other Ambulatory Visit: Payer: Self-pay | Admitting: Internal Medicine

## 2016-01-18 ENCOUNTER — Encounter: Payer: Self-pay | Admitting: Obstetrics & Gynecology

## 2016-01-18 DIAGNOSIS — Z79890 Hormone replacement therapy (postmenopausal): Secondary | ICD-10-CM | POA: Diagnosis not present

## 2016-01-18 DIAGNOSIS — Z124 Encounter for screening for malignant neoplasm of cervix: Secondary | ICD-10-CM | POA: Diagnosis not present

## 2016-01-18 DIAGNOSIS — Z1231 Encounter for screening mammogram for malignant neoplasm of breast: Secondary | ICD-10-CM | POA: Diagnosis not present

## 2016-01-25 ENCOUNTER — Telehealth: Payer: Self-pay | Admitting: Pulmonary Disease

## 2016-01-25 NOTE — Telephone Encounter (Signed)
Lmtcb. Will await call back 

## 2016-01-26 NOTE — Telephone Encounter (Signed)
Pt scheduled for appointment for 03-17-15 10:30am. Pt aware &voiced understanding. Nothing further needed.

## 2016-01-26 NOTE — Telephone Encounter (Signed)
Patient is returning phone call.  °

## 2016-01-26 NOTE — Telephone Encounter (Signed)
LMOM TCB x2

## 2016-02-02 DIAGNOSIS — M9901 Segmental and somatic dysfunction of cervical region: Secondary | ICD-10-CM | POA: Diagnosis not present

## 2016-02-02 DIAGNOSIS — M9903 Segmental and somatic dysfunction of lumbar region: Secondary | ICD-10-CM | POA: Diagnosis not present

## 2016-02-02 DIAGNOSIS — M9904 Segmental and somatic dysfunction of sacral region: Secondary | ICD-10-CM | POA: Diagnosis not present

## 2016-02-02 DIAGNOSIS — M5136 Other intervertebral disc degeneration, lumbar region: Secondary | ICD-10-CM | POA: Diagnosis not present

## 2016-02-02 DIAGNOSIS — M5137 Other intervertebral disc degeneration, lumbosacral region: Secondary | ICD-10-CM | POA: Diagnosis not present

## 2016-02-02 DIAGNOSIS — M531 Cervicobrachial syndrome: Secondary | ICD-10-CM | POA: Diagnosis not present

## 2016-02-24 LAB — HM MAMMOGRAPHY: HM Mammogram: NORMAL (ref 0–4)

## 2016-02-25 ENCOUNTER — Other Ambulatory Visit: Payer: Self-pay | Admitting: Internal Medicine

## 2016-03-04 DIAGNOSIS — Z471 Aftercare following joint replacement surgery: Secondary | ICD-10-CM | POA: Diagnosis not present

## 2016-03-04 DIAGNOSIS — Z96653 Presence of artificial knee joint, bilateral: Secondary | ICD-10-CM | POA: Diagnosis not present

## 2016-03-04 DIAGNOSIS — Z96651 Presence of right artificial knee joint: Secondary | ICD-10-CM | POA: Diagnosis not present

## 2016-03-04 DIAGNOSIS — Z96652 Presence of left artificial knee joint: Secondary | ICD-10-CM | POA: Diagnosis not present

## 2016-03-16 ENCOUNTER — Encounter: Payer: Self-pay | Admitting: Pulmonary Disease

## 2016-03-16 ENCOUNTER — Ambulatory Visit (INDEPENDENT_AMBULATORY_CARE_PROVIDER_SITE_OTHER): Payer: Medicare Other | Admitting: Pulmonary Disease

## 2016-03-16 DIAGNOSIS — G4733 Obstructive sleep apnea (adult) (pediatric): Secondary | ICD-10-CM

## 2016-03-16 NOTE — Patient Instructions (Signed)
We will provide you with a new DME CPAP supplies including nasal mask will be renewed for a year We will check CPAP download to ensure settings are okay

## 2016-03-16 NOTE — Progress Notes (Signed)
Subjective:    Patient ID: Kelsey Church, female    DOB: 1950-09-18, 66 y.o.   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 Her last visit was in 2014 She  moved from [place] in 2012  to be with her son in [place] after the demise of her husband.  She was diagnosed with OSA in 2013 after PSG showed moderate OSA with AHI 22/hour. She was placed on CPAP of 9 cm initially with nasal pillows and then switched to nasal mask with good improvement in her daytime somnolence and fatigue She retired in 2014 and since then underwent bilateral knee replacement surgery. She also had denture implants. She continues to be compliant with her CPAP machine and can tell a difference if she misses a night where she feels tired and somnolent the next day. Snoring is also noted by her grandkids if she sleeps without her machine  Epworth sleepiness score is 6/24. She is maintained on Celexa for depression. Bedtime is around 10 PM, sleep latency is minimal, she sleeps on her left side, with one pillow, reports one to 2 nocturnal awakenings and is out of bed by 7 AM feeling rested when she uses her CPAP without dryness of mouth or headaches. She does have a mini machine for travel when she goes on mission trips, she can feel it and if she does not have the humidity.  She has now switched to Medicare and would like to obtain a new DME and needs CPAP supplies. She denies any problems with mask or pressure. She does report that she feels the increase in pressure with the machine ramps up after a few minutes She may have lost about 10 pounds from 183 pounds in 2014 to172 pounds now   Significant tests/ events  PSG showed moderate OSa with AHi 22/h & lowest desatn to 87% corrected by CPAP 9 cm with swift nasal pillows-supine & REM sleep was noted.    Past Medical History:  Diagnosis Date  . Arthritis    PAIN AND OA LEFT KNEE; S/P  RIGHT TOTAL KNEE ARTHROPLASTY 06/10/13  . Cancer ([place])    melanoma in 2 sights  . Complication of anesthesia    "chills every evening at sundown x 2 weeks"- no fever  . Depression   . Dysphagia   . GERD (gastroesophageal reflux disease)   . History of shingles    NO RESIDUAL PROBLEMS  . Hypertension   . Melanoma in situ ([place])   . Pain    LOWER BACK PAIN  . Sleep apnea 09/2011   CPAP  . Sleep apnea     Past Surgical History:  Procedure Laterality Date  . APPENDECTOMY  1958  . COLONOSCOPY    . COLONOSCOPY W/ POLYPECTOMY     7 polyps  . FOOT SURGERY Right    removal plantar wart  . GALLBLADDER SURGERY  02/14/1990  . INJECTION KNEE  09/2011   right   . KNEE ARTHROSCOPY  1996  . MELANOMA EXCISION  1992   right chin, back  . POLYPECTOMY    . SALPINGOOPHORECTOMY  2009   Right  . TOTAL KNEE ARTHROPLASTY Right 06/10/2013   Procedure: RIGHT TOTAL KNEE ARTHROPLASTY;  Surgeon: [name], MD;  Location: [facility];  Service: Orthopedics;  Laterality: Right;  . TOTAL KNEE ARTHROPLASTY Left 12/16/2013   Procedure: LEFT TOTAL KNEE ARTHROPLASTY;  Surgeon: [name], MD;  Location: [facility];  Service: Orthopedics;  Laterality: Left;    No Known Allergies   Social History   Social History  . Marital status: Widowed    Spouse name: N/A  . Number of children: 1  . Years of education: N/A   Occupational History  . [employer]   Social History Main Topics  . Smoking status: Never Smoker  . Smokeless tobacco: Never Used  . Alcohol use No  . Drug use: No  . Sexual activity: Not Currently   Other Topics Concern  . Not on file   Social History Narrative   Caffienated drinks-no   Seat belt use often-yes   Regular Exercise-yes   Smoke alarm in the home-yes   Firearms/guns in the home-no   History of physical abuse-no                Family History  Problem Relation Age of Onset  . Heart disease Father   . Cancer Maternal Grandmother     Breast and Uterine  Cancer  . Osteoporosis Mother   . Hypertension Sister   . Diabetes Sister   . Colon polyps Sister   . Colon cancer Neg Hx   . Rectal cancer Neg Hx   . Stomach cancer Neg Hx   . Esophageal cancer Neg Hx      Review of Systems Constitutional: negative for anorexia, fevers and sweats  Eyes: negative for irritation, redness and visual disturbance  Ears, nose, mouth, throat, and face: negative for earaches, epistaxis, nasal congestion and sore throat  Respiratory: negative for cough, dyspnea on exertion, sputum and wheezing  Cardiovascular: negative for chest pain, dyspnea, lower extremity edema, orthopnea, palpitations and syncope  Gastrointestinal: negative for abdominal pain, constipation, diarrhea, melena, nausea and vomiting  Genitourinary:negative for dysuria, frequency and hematuria  Hematologic/lymphatic: negative for bleeding, easy bruising and lymphadenopathy  Musculoskeletal:negative for arthralgias, muscle weakness and stiff joints  Neurological: negative for coordination problems, gait problems, headaches and weakness  Endocrine: negative for diabetic symptoms including polydipsia, polyuria and weight loss     Objective:   Physical Exam  Gen. Pleasant, well-nourished, in no distress ENT - no lesions, no post nasal drip Neck: No JVD, no thyromegaly, no carotid bruits Lungs: no use of accessory muscles, no dullness to percussion, clear without rales or rhonchi  Cardiovascular: Rhythm regular, heart sounds  normal, no murmurs or gallops, no peripheral edema Musculoskeletal: No deformities, no cyanosis or clubbing        Assessment & Plan:

## 2016-03-16 NOTE — Assessment & Plan Note (Signed)
We will provide you with a new DME CPAP supplies including nasal mask will be renewed for a year We will check CPAP download to ensure settings are okay  Weight loss encouraged, compliance with goal of at least 4-6 hrs every night is the expectation. Advised against medications with sedative side effects Cautioned against driving when sleepy - understanding that sleepiness will vary on a day to day basis

## 2016-03-18 ENCOUNTER — Encounter: Payer: Self-pay | Admitting: Pulmonary Disease

## 2016-03-22 ENCOUNTER — Telehealth: Payer: Self-pay | Admitting: Pulmonary Disease

## 2016-03-22 NOTE — Telephone Encounter (Signed)
Per Dr. Elsworth Soho, CPAP download showed great control 9cm. Wants patient to stay on 9cm.

## 2016-03-23 NOTE — Telephone Encounter (Signed)
Detailed message left on pt's VM. Instructed her to call if she had any questions.

## 2016-03-25 DIAGNOSIS — M9901 Segmental and somatic dysfunction of cervical region: Secondary | ICD-10-CM | POA: Diagnosis not present

## 2016-03-25 DIAGNOSIS — M9903 Segmental and somatic dysfunction of lumbar region: Secondary | ICD-10-CM | POA: Diagnosis not present

## 2016-03-25 DIAGNOSIS — M531 Cervicobrachial syndrome: Secondary | ICD-10-CM | POA: Diagnosis not present

## 2016-03-25 DIAGNOSIS — M5136 Other intervertebral disc degeneration, lumbar region: Secondary | ICD-10-CM | POA: Diagnosis not present

## 2016-03-25 DIAGNOSIS — M5137 Other intervertebral disc degeneration, lumbosacral region: Secondary | ICD-10-CM | POA: Diagnosis not present

## 2016-03-25 DIAGNOSIS — M9904 Segmental and somatic dysfunction of sacral region: Secondary | ICD-10-CM | POA: Diagnosis not present

## 2016-03-29 ENCOUNTER — Ambulatory Visit (INDEPENDENT_AMBULATORY_CARE_PROVIDER_SITE_OTHER): Payer: Medicare Other | Admitting: Internal Medicine

## 2016-03-29 ENCOUNTER — Encounter: Payer: Self-pay | Admitting: Internal Medicine

## 2016-03-29 ENCOUNTER — Other Ambulatory Visit (INDEPENDENT_AMBULATORY_CARE_PROVIDER_SITE_OTHER): Payer: Medicare Other

## 2016-03-29 VITALS — BP 118/70 | HR 76 | Temp 98.0°F | Ht 66.0 in | Wt 191.5 lb

## 2016-03-29 DIAGNOSIS — Z23 Encounter for immunization: Secondary | ICD-10-CM

## 2016-03-29 DIAGNOSIS — Z0000 Encounter for general adult medical examination without abnormal findings: Secondary | ICD-10-CM | POA: Diagnosis not present

## 2016-03-29 DIAGNOSIS — E7800 Pure hypercholesterolemia, unspecified: Secondary | ICD-10-CM

## 2016-03-29 DIAGNOSIS — I10 Essential (primary) hypertension: Secondary | ICD-10-CM

## 2016-03-29 DIAGNOSIS — E538 Deficiency of other specified B group vitamins: Secondary | ICD-10-CM | POA: Diagnosis not present

## 2016-03-29 DIAGNOSIS — R739 Hyperglycemia, unspecified: Secondary | ICD-10-CM

## 2016-03-29 LAB — URINALYSIS, ROUTINE W REFLEX MICROSCOPIC
BILIRUBIN URINE: NEGATIVE
Hgb urine dipstick: NEGATIVE
Ketones, ur: NEGATIVE
LEUKOCYTES UA: NEGATIVE
NITRITE: NEGATIVE
PH: 5.5 (ref 5.0–8.0)
RBC / HPF: NONE SEEN (ref 0–?)
Specific Gravity, Urine: >=1.03 — AB (ref 1.000–1.030)
TOTAL PROTEIN, URINE-UPE24: NEGATIVE
Urine Glucose: NEGATIVE
Urobilinogen, UA: 0.2 (ref 0.0–1.0)

## 2016-03-29 LAB — CBC WITH DIFFERENTIAL/PLATELET
BASOS ABS: 0.1 10*3/uL (ref 0.0–0.1)
Basophils Relative: 1 % (ref 0.0–3.0)
EOS ABS: 0.2 10*3/uL (ref 0.0–0.7)
Eosinophils Relative: 3.4 % (ref 0.0–5.0)
HEMATOCRIT: 42.2 % (ref 36.0–46.0)
Hemoglobin: 14 g/dL (ref 12.0–15.0)
LYMPHS PCT: 20.5 % (ref 12.0–46.0)
Lymphs Abs: 1.5 10*3/uL (ref 0.7–4.0)
MCHC: 33.2 g/dL (ref 30.0–36.0)
MCV: 83.4 fl (ref 78.0–100.0)
Monocytes Absolute: 0.7 10*3/uL (ref 0.1–1.0)
Monocytes Relative: 9.3 % (ref 3.0–12.0)
Neutro Abs: 4.8 10*3/uL (ref 1.4–7.7)
Neutrophils Relative %: 65.8 % (ref 43.0–77.0)
Platelets: 273 10*3/uL (ref 150.0–400.0)
RBC: 5.06 Mil/uL (ref 3.87–5.11)
RDW: 14.9 % (ref 11.5–15.5)
WBC: 7.2 10*3/uL (ref 4.0–10.5)

## 2016-03-29 LAB — COMPREHENSIVE METABOLIC PANEL
ALK PHOS: 76 U/L (ref 39–117)
ALT: 15 U/L (ref 0–35)
AST: 15 U/L (ref 0–37)
Albumin: 4.2 g/dL (ref 3.5–5.2)
BILIRUBIN TOTAL: 0.5 mg/dL (ref 0.2–1.2)
BUN: 29 mg/dL — ABNORMAL HIGH (ref 6–23)
CALCIUM: 9.5 mg/dL (ref 8.4–10.5)
CO2: 28 mEq/L (ref 19–32)
Chloride: 102 mEq/L (ref 96–112)
Creatinine, Ser: 1.12 mg/dL (ref 0.40–1.20)
GFR: 51.79 mL/min — AB (ref >60.00)
GLUCOSE: 94 mg/dL (ref 70–99)
Potassium: 3.9 mEq/L (ref 3.5–5.1)
Sodium: 139 mEq/L (ref 135–145)
TOTAL PROTEIN: 7 g/dL (ref 6.0–8.3)

## 2016-03-29 LAB — LIPID PANEL
CHOL/HDL RATIO: 4
CHOLESTEROL: 190 mg/dL (ref 0–200)
HDL: 53.5 mg/dL (ref >39.00)
LDL CALC: 104 mg/dL — AB (ref 0–99)
NonHDL: 136.25
Triglycerides: 161 mg/dL — ABNORMAL HIGH (ref 0.0–149.0)
VLDL: 32.2 mg/dL (ref 0.0–40.0)

## 2016-03-29 LAB — TSH: TSH: 1.53 u[IU]/mL (ref 0.35–4.50)

## 2016-03-29 LAB — HEMOGLOBIN A1C: Hgb A1c MFr Bld: 6.1 % (ref 4.6–6.5)

## 2016-03-29 NOTE — Progress Notes (Signed)
Pre visit review using our clinic review tool, if applicable. No additional management support is needed unless otherwise documented below in the visit note. 

## 2016-03-29 NOTE — Progress Notes (Signed)
Subjective:  Patient ID: Kelsey Church, female    DOB: 1950-10-31  Age: 66 y.o. MRN: [id number]  CC: Hypertension; Hyperlipidemia; and Annual Exam   HPI Kelsey Church presents for an AWV/CPX.  She complains of occasional episodes of fatigue but denies chest pain, DOE, palpitations, or edema. She tells me her blood pressure has been well controlled.  Past Medical History:  Diagnosis Date  . Arthritis    PAIN AND OA LEFT KNEE; S/P RIGHT TOTAL KNEE ARTHROPLASTY 06/10/13  . Cancer ([place])    melanoma in 2 sights  . Complication of anesthesia    "chills every evening at sundown x 2 weeks"- no fever  . Depression   . Dysphagia   . GERD (gastroesophageal reflux disease)   . History of shingles    NO RESIDUAL PROBLEMS  . Hypertension   . Melanoma in situ ([place])   . Pain    LOWER BACK PAIN  . Sleep apnea 09/2011   CPAP  . Sleep apnea    Past Surgical History:  Procedure Laterality Date  . APPENDECTOMY  1958  . COLONOSCOPY    . COLONOSCOPY W/ POLYPECTOMY     7 polyps  . FOOT SURGERY Right    removal plantar wart  . GALLBLADDER SURGERY  02/14/1990  . INJECTION KNEE  09/2011   right   . KNEE ARTHROSCOPY  1996  . MELANOMA EXCISION  1992   right chin, back  . POLYPECTOMY    . SALPINGOOPHORECTOMY  2009   Right  . TOTAL KNEE ARTHROPLASTY Right 06/10/2013   Procedure: RIGHT TOTAL KNEE ARTHROPLASTY;  Surgeon: [name], MD;  Location: [facility];  Service: Orthopedics;  Laterality: Right;  . TOTAL KNEE ARTHROPLASTY Left 12/16/2013   Procedure: LEFT TOTAL KNEE ARTHROPLASTY;  Surgeon: [name], MD;  Location: [facility];  Service: Orthopedics;  Laterality: Left;    reports that she has never smoked. She has never used smokeless tobacco. She reports that she does not drink alcohol or use drugs. family history includes Cancer in her maternal grandmother; Colon polyps in her sister; Diabetes in her sister; Heart disease in her father; Hypertension in her sister; Osteoporosis in  her mother. No Known Allergies  Outpatient Medications Prior to Visit  Medication Sig Dispense Refill  . citalopram (CELEXA) 20 MG tablet TAKE 1 TABLET (20 MG TOTAL) BY MOUTH AT BEDTIME. 90 tablet 3  . Diclofenac Sodium (PENNSAID) 2 % SOLN Place 1 application onto the skin 2 (two) times daily as needed. 112 g 0  . ESTRADIOL TD Place onto the skin.    Marland Kitchen losartan-hydrochlorothiazide (HYZAAR) 100-25 MG tablet TAKE 1 TABLET BY MOUTH DAILY. 90 tablet 1  . meloxicam (MOBIC) 15 MG tablet TAKE 1 TABLET BY MOUTH ONCE DAILY--FILL 08/09/15-- 90 tablet 1   No facility-administered medications prior to visit.     ROS Review of Systems  Constitutional: Positive for fatigue. Negative for activity change, appetite change, chills, diaphoresis, fever and unexpected weight change.  HENT: Negative.  Negative for trouble swallowing and voice change.   Eyes: Negative for visual disturbance.  Respiratory: Negative for cough, chest tightness, shortness of breath, wheezing and stridor.   Cardiovascular: Negative for chest pain, palpitations and leg swelling.  Gastrointestinal: Negative.  Negative for abdominal pain, constipation, diarrhea, nausea and vomiting.  Endocrine: Negative.   Genitourinary: Negative.  Negative for decreased urine volume, difficulty urinating, dysuria, hematuria and urgency.  Musculoskeletal: Negative.  Negative for back pain, myalgias and neck pain.  Skin: Negative.  Negative for color change and rash.  Allergic/Immunologic: Negative.   Neurological: Negative for dizziness, weakness and headaches.  Hematological: Negative for adenopathy. Does not bruise/bleed easily.  Psychiatric/Behavioral: Negative.     Objective:  BP 118/70 (BP Location: Left Arm, Patient Position: Sitting, Cuff Size: Large)   Pulse 76   Temp 98 F (36.7 C) (Oral)   Ht 5\' 6"  (1.676 m)   Wt 191 lb 8 oz (86.9 kg)   SpO2 96%   BMI 30.91 kg/m   BP Readings from Last 3 Encounters:  03/29/16 118/70  03/16/16  118/72  12/18/15 102/62    Wt Readings from Last 3 Encounters:  03/29/16 191 lb 8 oz (86.9 kg)  03/16/16 172 lb (78 kg)  12/18/15 188 lb 8 oz (85.5 kg)    Physical Exam  Constitutional: She is oriented to person, place, and time. She appears well-developed and well-nourished. No distress.  HENT:  Mouth/Throat: Oropharynx is clear and moist. No oropharyngeal exudate.  Eyes: Conjunctivae are normal. Right eye exhibits no discharge. Left eye exhibits no discharge. No scleral icterus.  Neck: Normal range of motion. Neck supple. No JVD present. No tracheal deviation present. No thyromegaly present.  Cardiovascular: Normal rate, regular rhythm, normal heart sounds and intact distal pulses.  Exam reveals no gallop and no friction rub.   No murmur heard. Pulmonary/Chest: Effort normal and breath sounds normal. No stridor. No respiratory distress. She has no wheezes. She has no rales. She exhibits no tenderness.  Abdominal: Soft. Bowel sounds are normal. She exhibits no distension. There is no tenderness. There is no rebound and no guarding.  Musculoskeletal: Normal range of motion. She exhibits no edema, tenderness or deformity.  Lymphadenopathy:    She has no cervical adenopathy.  Neurological: She is oriented to person, place, and time.  Skin: Skin is warm and dry. No rash noted. She is not diaphoretic. No erythema. No pallor.  Psychiatric: She has a normal mood and affect. Her behavior is normal. Judgment and thought content normal.  Vitals reviewed.   Lab Results  Component Value Date   WBC 7.2 03/29/2016   HGB 14.0 03/29/2016   HCT 42.2 03/29/2016   PLT 273.0 03/29/2016   GLUCOSE 94 03/29/2016   CHOL 190 03/29/2016   TRIG 161.0 (H) 03/29/2016   HDL 53.50 03/29/2016   LDLCALC 104 (H) 03/29/2016   ALT 15 03/29/2016   AST 15 03/29/2016   NA 139 03/29/2016   K 3.9 03/29/2016   CL 102 03/29/2016   CREATININE 1.12 03/29/2016   BUN 29 (H) 03/29/2016   CO2 28 03/29/2016   TSH  1.53 03/29/2016   INR 1.17 12/27/2013   HGBA1C 6.1 03/29/2016    No results found.  Assessment & Plan:   Gerre was seen today for hypertension, hyperlipidemia and annual exam.  Diagnoses and all orders for this visit:  Essential hypertension, benign- her blood pressure is well-controlled, electrolytes and renal function are normal. -     Comprehensive metabolic panel; Future -     Urinalysis, Routine w reflex microscopic; Future  Hyperglycemia- her A1c is up to 6.0%, she is prediabetic but no medications are needed to treat this.  Comprehensive metabolic panel; Future -     Hemoglobin A1c; Future  Pure hypercholesterolemia- Her Framingham risk score is only 2% so I do not recommend that she take a statin. -     Lipid panel; Future -     TSH; Future  Vitamin B12 deficiency-  she is not anemic and has no symptoms, we'll continue oral B12 replacement therapy. -     CBC with Differential/Platelet; Future   I am having Ms. Celestin maintain her citalopram, Diclofenac Sodium, losartan-hydrochlorothiazide, meloxicam, ESTRADIOL TD, and progesterone.  Meds ordered this encounter  Medications  . progesterone (PROMETRIUM) 100 MG capsule    Sig: 1 (ONE) CAPSULE, ORAL, AT BEDTIME    Refill:  4   See AVS for instructions about healthy living and anticipatory guidance.  Follow-up: Return in about 6 months (around 09/26/2016).  [name], MD

## 2016-03-29 NOTE — Patient Instructions (Signed)

## 2016-03-31 ENCOUNTER — Encounter: Payer: Self-pay | Admitting: Internal Medicine

## 2016-04-01 DIAGNOSIS — M531 Cervicobrachial syndrome: Secondary | ICD-10-CM | POA: Diagnosis not present

## 2016-04-01 DIAGNOSIS — M5136 Other intervertebral disc degeneration, lumbar region: Secondary | ICD-10-CM | POA: Diagnosis not present

## 2016-04-01 DIAGNOSIS — M9901 Segmental and somatic dysfunction of cervical region: Secondary | ICD-10-CM | POA: Diagnosis not present

## 2016-04-01 DIAGNOSIS — M9903 Segmental and somatic dysfunction of lumbar region: Secondary | ICD-10-CM | POA: Diagnosis not present

## 2016-04-01 DIAGNOSIS — M5137 Other intervertebral disc degeneration, lumbosacral region: Secondary | ICD-10-CM | POA: Diagnosis not present

## 2016-04-01 DIAGNOSIS — M9904 Segmental and somatic dysfunction of sacral region: Secondary | ICD-10-CM | POA: Diagnosis not present

## 2016-04-03 NOTE — Assessment & Plan Note (Signed)

## 2016-04-12 DIAGNOSIS — M531 Cervicobrachial syndrome: Secondary | ICD-10-CM | POA: Diagnosis not present

## 2016-04-12 DIAGNOSIS — M9903 Segmental and somatic dysfunction of lumbar region: Secondary | ICD-10-CM | POA: Diagnosis not present

## 2016-04-12 DIAGNOSIS — M9904 Segmental and somatic dysfunction of sacral region: Secondary | ICD-10-CM | POA: Diagnosis not present

## 2016-04-12 DIAGNOSIS — M9901 Segmental and somatic dysfunction of cervical region: Secondary | ICD-10-CM | POA: Diagnosis not present

## 2016-04-12 DIAGNOSIS — M5137 Other intervertebral disc degeneration, lumbosacral region: Secondary | ICD-10-CM | POA: Diagnosis not present

## 2016-04-12 DIAGNOSIS — M5136 Other intervertebral disc degeneration, lumbar region: Secondary | ICD-10-CM | POA: Diagnosis not present

## 2016-04-26 DIAGNOSIS — M5136 Other intervertebral disc degeneration, lumbar region: Secondary | ICD-10-CM | POA: Diagnosis not present

## 2016-04-26 DIAGNOSIS — M5137 Other intervertebral disc degeneration, lumbosacral region: Secondary | ICD-10-CM | POA: Diagnosis not present

## 2016-04-26 DIAGNOSIS — M531 Cervicobrachial syndrome: Secondary | ICD-10-CM | POA: Diagnosis not present

## 2016-04-26 DIAGNOSIS — M9904 Segmental and somatic dysfunction of sacral region: Secondary | ICD-10-CM | POA: Diagnosis not present

## 2016-04-26 DIAGNOSIS — M9901 Segmental and somatic dysfunction of cervical region: Secondary | ICD-10-CM | POA: Diagnosis not present

## 2016-04-26 DIAGNOSIS — M9903 Segmental and somatic dysfunction of lumbar region: Secondary | ICD-10-CM | POA: Diagnosis not present

## 2016-05-19 ENCOUNTER — Telehealth: Payer: Self-pay | Admitting: Pulmonary Disease

## 2016-05-19 DIAGNOSIS — G4733 Obstructive sleep apnea (adult) (pediatric): Secondary | ICD-10-CM

## 2016-05-19 NOTE — Telephone Encounter (Signed)
Called and spoke to pt. She states she has not been set up with a new DME company. Pt states she was with [facility] but her insurance no longer covers them. New order placed. Pt verbalized understanding and denied any further questions or concerns at this time.

## 2016-05-24 DIAGNOSIS — L600 Ingrowing nail: Secondary | ICD-10-CM | POA: Diagnosis not present

## 2016-05-24 DIAGNOSIS — L602 Onychogryphosis: Secondary | ICD-10-CM | POA: Diagnosis not present

## 2016-06-06 DIAGNOSIS — M5137 Other intervertebral disc degeneration, lumbosacral region: Secondary | ICD-10-CM | POA: Diagnosis not present

## 2016-06-06 DIAGNOSIS — M5136 Other intervertebral disc degeneration, lumbar region: Secondary | ICD-10-CM | POA: Diagnosis not present

## 2016-06-06 DIAGNOSIS — M9904 Segmental and somatic dysfunction of sacral region: Secondary | ICD-10-CM | POA: Diagnosis not present

## 2016-06-06 DIAGNOSIS — M9903 Segmental and somatic dysfunction of lumbar region: Secondary | ICD-10-CM | POA: Diagnosis not present

## 2016-06-06 DIAGNOSIS — M531 Cervicobrachial syndrome: Secondary | ICD-10-CM | POA: Diagnosis not present

## 2016-06-06 DIAGNOSIS — M9901 Segmental and somatic dysfunction of cervical region: Secondary | ICD-10-CM | POA: Diagnosis not present

## 2016-06-17 DIAGNOSIS — M9901 Segmental and somatic dysfunction of cervical region: Secondary | ICD-10-CM | POA: Diagnosis not present

## 2016-06-17 DIAGNOSIS — M531 Cervicobrachial syndrome: Secondary | ICD-10-CM | POA: Diagnosis not present

## 2016-06-17 DIAGNOSIS — M9903 Segmental and somatic dysfunction of lumbar region: Secondary | ICD-10-CM | POA: Diagnosis not present

## 2016-06-17 DIAGNOSIS — M5137 Other intervertebral disc degeneration, lumbosacral region: Secondary | ICD-10-CM | POA: Diagnosis not present

## 2016-06-17 DIAGNOSIS — M9904 Segmental and somatic dysfunction of sacral region: Secondary | ICD-10-CM | POA: Diagnosis not present

## 2016-06-17 DIAGNOSIS — M5136 Other intervertebral disc degeneration, lumbar region: Secondary | ICD-10-CM | POA: Diagnosis not present

## 2016-06-23 ENCOUNTER — Encounter: Payer: Self-pay | Admitting: Pulmonary Disease

## 2016-06-27 DIAGNOSIS — M9904 Segmental and somatic dysfunction of sacral region: Secondary | ICD-10-CM | POA: Diagnosis not present

## 2016-06-27 DIAGNOSIS — M5137 Other intervertebral disc degeneration, lumbosacral region: Secondary | ICD-10-CM | POA: Diagnosis not present

## 2016-06-27 DIAGNOSIS — M9901 Segmental and somatic dysfunction of cervical region: Secondary | ICD-10-CM | POA: Diagnosis not present

## 2016-06-27 DIAGNOSIS — M9903 Segmental and somatic dysfunction of lumbar region: Secondary | ICD-10-CM | POA: Diagnosis not present

## 2016-06-27 DIAGNOSIS — M531 Cervicobrachial syndrome: Secondary | ICD-10-CM | POA: Diagnosis not present

## 2016-06-27 DIAGNOSIS — M5136 Other intervertebral disc degeneration, lumbar region: Secondary | ICD-10-CM | POA: Diagnosis not present

## 2016-07-01 ENCOUNTER — Other Ambulatory Visit: Payer: Self-pay | Admitting: Internal Medicine

## 2016-07-25 ENCOUNTER — Encounter: Payer: Self-pay | Admitting: Family

## 2016-07-25 ENCOUNTER — Ambulatory Visit (INDEPENDENT_AMBULATORY_CARE_PROVIDER_SITE_OTHER): Payer: Medicare Other | Admitting: Family

## 2016-07-25 VITALS — BP 122/59 | HR 77 | Temp 97.7°F | Resp 16 | Ht 66.0 in | Wt 193.4 lb

## 2016-07-25 DIAGNOSIS — L304 Erythema intertrigo: Secondary | ICD-10-CM

## 2016-07-25 MED ORDER — FLUCONAZOLE 150 MG PO TABS: ORAL_TABLET | ORAL | 0 refills | Status: DC

## 2016-07-25 MED ORDER — NYSTATIN 100000 UNIT/GM EX POWD: Freq: Two times a day (BID) | CUTANEOUS | 0 refills | Status: DC

## 2016-07-25 NOTE — Progress Notes (Signed)
Subjective:    Patient ID: Kelsey Church, female    DOB: 1950/03/22, 66 y.o.   MRN: [id number]  HPI  [name] is a 66 yr old female who presents today with chief complaint of rash. Reports rash is located beneath both breasts and beneath abdominal skin fold. Has a smaller lesion on the right buttock. Rash is pruritic.  Tried hydrocortisone and she reports that this seemed to worsen her itching. She reports that she did have a fall down a hill last week and came into contact with poison ivy.   Review of Systems See HPI  Past Medical History:  Diagnosis Date  . Arthritis    PAIN AND OA LEFT KNEE; S/P RIGHT TOTAL KNEE ARTHROPLASTY 06/10/13  . Cancer ([place])    melanoma in 2 sights  . Complication of anesthesia    "chills every evening at sundown x 2 weeks"- no fever  . Depression   . Dysphagia   . GERD (gastroesophageal reflux disease)   . History of shingles    NO RESIDUAL PROBLEMS  . Hypertension   . Melanoma in situ ([place])   . Pain    LOWER BACK PAIN  . Sleep apnea 09/2011   CPAP  . Sleep apnea      Social History   Social History  . Marital status: Widowed    Spouse name: N/A  . Number of children: 1  . Years of education: N/A   Occupational History  . [employer]   Social History Main Topics  . Smoking status: Never Smoker  . Smokeless tobacco: Never Used  . Alcohol use No  . Drug use: No  . Sexual activity: Not Currently   Other Topics Concern  . Not on file   Social History Narrative   Caffienated drinks-no   Seat belt use often-yes   Regular Exercise-yes   Smoke alarm in the home-yes   Firearms/guns in the home-no   History of physical abuse-no                Past Surgical History:  Procedure Laterality Date  . APPENDECTOMY  1958  . COLONOSCOPY    . COLONOSCOPY W/ POLYPECTOMY     7 polyps  . FOOT SURGERY Right    removal plantar wart  . GALLBLADDER SURGERY  02/14/1990  . INJECTION KNEE  09/2011   right   . KNEE ARTHROSCOPY  1996    . MELANOMA EXCISION  1992   right chin, back  . POLYPECTOMY    . SALPINGOOPHORECTOMY  2009   Right  . TOTAL KNEE ARTHROPLASTY Right 06/10/2013   Procedure: RIGHT TOTAL KNEE ARTHROPLASTY;  Surgeon: [name], MD;  Location: [facility];  Service: Orthopedics;  Laterality: Right;  . TOTAL KNEE ARTHROPLASTY Left 12/16/2013   Procedure: LEFT TOTAL KNEE ARTHROPLASTY;  Surgeon: [name], MD;  Location: [facility];  Service: Orthopedics;  Laterality: Left;    Family History  Problem Relation Age of Onset  . Heart disease Father   . Cancer Maternal Grandmother        Breast and Uterine Cancer  . Osteoporosis Mother   . Hypertension Sister   . Diabetes Sister   . Colon polyps Sister   . Colon cancer Neg Hx   . Rectal cancer Neg Hx   . Stomach cancer Neg Hx   . Esophageal cancer Neg Hx     No Known Allergies  Current Outpatient Prescriptions on File Prior to Visit  Medication Sig Dispense Refill  . citalopram (CELEXA) 20 MG tablet TAKE 1 TABLET (20 MG TOTAL) BY MOUTH AT BEDTIME. 90 tablet 3  . ESTRADIOL TD Place onto the skin.    Marland Kitchen losartan-hydrochlorothiazide (HYZAAR) 100-25 MG tablet TAKE 1 TABLET BY MOUTH DAILY. 90 tablet 1  . meloxicam (MOBIC) 15 MG tablet TAKE 1 TABLET BY MOUTH ONCE DAILY--FILL 08/09/15-- 90 tablet 1  . progesterone (PROMETRIUM) 100 MG capsule 1 (ONE) CAPSULE, ORAL, AT BEDTIME  4  . Diclofenac Sodium (PENNSAID) 2 % SOLN Place 1 application onto the skin 2 (two) times daily as needed. (Patient not taking: Reported on 07/25/2016) 112 g 0   No current facility-administered medications on file prior to visit.     BP (!) 122/59 (BP Location: Left Arm, Cuff Size: Large)   Pulse 77   Temp 97.7 F (36.5 C) (Oral)   Resp 16   Ht 5\' 6"  (1.676 m)   Wt 193 lb 6.4 oz (87.7 kg)   SpO2 98% Comment: room air  BMI 31.22 kg/m       Objective:   Physical Exam  Constitutional: She is oriented to person, place, and time. She appears well-developed and well-nourished.  No distress.  Neurological: She is alert and oriented to person, place, and time.  Skin: Skin is warm and dry.  + erythematous rash noted beneath both breasts and beneath abdominal skin fold. + small round lesion on right buttock  Psychiatric: She has a normal mood and affect. Her behavior is normal. Judgment and thought content normal.          Assessment & Plan:  Intertrigo- advised pt that this does not appear to be poison ivy. Also, the areas of concern are not areas that came into contact with the poison ivy. There is no rash on her arms/legs. Will rx with diflucan and topical nystatin powder. Due to possible interaction with citalopram, advised pt to hold citalopram dose on the days that she takes diflucan. If rash resolved after 1 week, advised pt that she does not need to take the second dose of diflucan.

## 2016-07-25 NOTE — Patient Instructions (Signed)
Hold citalopram the same day that you take fluconazole due to potential drug interaction. Apply nystatin powder twice daily to affected area. Call if symptoms worsen or if symptoms are not improved in 1 week.

## 2016-07-28 ENCOUNTER — Emergency Department (HOSPITAL_BASED_OUTPATIENT_CLINIC_OR_DEPARTMENT_OTHER)
Admission: EM | Admit: 2016-07-28 | Discharge: 2016-07-29 | Disposition: A | Discharge status: Home / Self Care | Payer: Medicare Other | Attending: Emergency Medicine | Admitting: Emergency Medicine

## 2016-07-28 ENCOUNTER — Encounter (HOSPITAL_BASED_OUTPATIENT_CLINIC_OR_DEPARTMENT_OTHER): Payer: Self-pay | Admitting: Respiratory Therapy

## 2016-07-28 ENCOUNTER — Telehealth: Payer: Self-pay | Admitting: Internal Medicine

## 2016-07-28 DIAGNOSIS — R21 Rash and other nonspecific skin eruption: Secondary | ICD-10-CM | POA: Diagnosis present

## 2016-07-28 DIAGNOSIS — L304 Erythema intertrigo: Secondary | ICD-10-CM | POA: Diagnosis not present

## 2016-07-28 DIAGNOSIS — I10 Essential (primary) hypertension: Secondary | ICD-10-CM | POA: Diagnosis not present

## 2016-07-28 DIAGNOSIS — Z96653 Presence of artificial knee joint, bilateral: Secondary | ICD-10-CM | POA: Diagnosis not present

## 2016-07-28 DIAGNOSIS — L237 Allergic contact dermatitis due to plants, except food: Secondary | ICD-10-CM | POA: Insufficient documentation

## 2016-07-28 DIAGNOSIS — Z79818 Long term (current) use of other agents affecting estrogen receptors and estrogen levels: Secondary | ICD-10-CM | POA: Insufficient documentation

## 2016-07-28 DIAGNOSIS — Z79899 Other long term (current) drug therapy: Secondary | ICD-10-CM | POA: Insufficient documentation

## 2016-07-28 DIAGNOSIS — Z85820 Personal history of malignant melanoma of skin: Secondary | ICD-10-CM | POA: Insufficient documentation

## 2016-07-28 MED ORDER — CLOTRIMAZOLE 1 % EX CREA: TOPICAL_CREAM | CUTANEOUS | 0 refills | Status: DC

## 2016-07-28 MED ORDER — METHYLPREDNISOLONE 4 MG PO TBPK: ORAL_TABLET | ORAL | 0 refills | Status: DC

## 2016-07-28 MED ORDER — FLUCONAZOLE 200 MG PO TABS: 200.0000 mg | ORAL_TABLET | Freq: Every day | ORAL | 0 refills | Status: AC

## 2016-07-28 MED ORDER — DEXAMETHASONE SODIUM PHOSPHATE 10 MG/ML IJ SOLN
10.0000 mg | Freq: Once | INTRAMUSCULAR | Status: AC
  Administered 2016-07-28: 10 mg via INTRAMUSCULAR
  Filled 2016-07-28: qty 1

## 2016-07-28 NOTE — ED Provider Notes (Signed)
[facility]PT [facility] Provider Note   CSN: [id number] Arrival date & time: 07/28/16  2123  By signing my name below, I, Jeanell Sparrow, attest that this documentation has been prepared under the direction and in the presence of Pollina, Gwenyth Allegra, *. Electronically Signed: Jeanell Sparrow, Scribe. 07/28/2016. 11:06 PM.  History   Chief Complaint Chief Complaint  Patient presents with  . Rash   The history is provided by the patient. No language interpreter was used.    HPI Comments: Kelsey Church is a 66 y.o. female who presents to the Emergency Department complaining of constant moderate abdominal rash that started yesterday. She states she fell into poison oak while in hot weather. She was seen was given Diflucan and Nystatin yesterday without relief. She has an itchy, rash to her upper/lower abdominal, buttocks, and upper BLE. Denies any other complaints at this time.  Past Medical History:  Diagnosis Date  . Arthritis    PAIN AND OA LEFT KNEE; S/P RIGHT TOTAL KNEE ARTHROPLASTY 06/10/13  . Cancer ([place])    melanoma in 2 sights  . Complication of anesthesia    "chills every evening at sundown x 2 weeks"- no fever  . Depression   . Dysphagia   . GERD (gastroesophageal reflux disease)   . History of shingles    NO RESIDUAL PROBLEMS  . Hypertension   . Melanoma in situ ([place])   . Pain    LOWER BACK PAIN  . Sleep apnea 09/2011   CPAP  . Sleep apnea     Patient Active Problem List   Diagnosis Date Noted  . 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 Nonspecific abnormal electrocardiogram (ECG) (EKG) 08/27/2015  . BPPV (benign paroxysmal positional vertigo) 08/27/2015  . Gastroesophageal reflux disease with esophagitis 12/10/2014  . Moderate osteopenia 12/10/2014  . Vitamin B12 deficiency 12/10/2014  . OA (osteoarthritis) of knee 12/16/2013  . Hyperglycemia 12/03/2013  . Bursitis of right shoulder 10/04/2013  . Other screening mammogram 09/25/2012  . DJD (degenerative joint  disease) of knee 09/09/2011  . OSA (obstructive sleep apnea) 07/29/2011  . Essential hypertension, benign 07/29/2011  . Pure hypercholesterolemia 07/29/2011  . Routine general medical examination at a health care facility 07/29/2011    Past Surgical History:  Procedure Laterality Date  . APPENDECTOMY  1958  . COLONOSCOPY    . COLONOSCOPY W/ POLYPECTOMY     7 polyps  . FOOT SURGERY Right    removal plantar wart  . GALLBLADDER SURGERY  02/14/1990  . INJECTION KNEE  09/2011   right   . KNEE ARTHROSCOPY  1996  . MELANOMA EXCISION  1992   right chin, back  . POLYPECTOMY    . SALPINGOOPHORECTOMY  2009   Right  . TOTAL KNEE ARTHROPLASTY Right 06/10/2013   Procedure: RIGHT TOTAL KNEE ARTHROPLASTY;  Surgeon: [name], MD;  Location: [facility];  Service: Orthopedics;  Laterality: Right;  . TOTAL KNEE ARTHROPLASTY Left 12/16/2013   Procedure: LEFT TOTAL KNEE ARTHROPLASTY;  Surgeon: [name], MD;  Location: [facility];  Service: Orthopedics;  Laterality: Left;    OB History    No data available       Home Medications    Prior to Admission medications   Medication Sig Start Date End Date Taking? Authorizing Provider  citalopram (CELEXA) 20 MG tablet TAKE 1 TABLET (20 MG TOTAL) BY MOUTH AT BEDTIME. 07/09/15  Yes Janith Lima, MD  Diclofenac Sodium (PENNSAID) 2 % SOLN Place 1 application onto the skin  2 (two) times daily as needed. 10/02/15  Yes Golden Circle, FNP  ESTRADIOL TD Place onto the skin.   Yes [provider]  losartan-hydrochlorothiazide (HYZAAR) 100-25 MG tablet TAKE 1 TABLET BY MOUTH DAILY. 07/02/16  Yes Janith Lima, MD  meloxicam (MOBIC) 15 MG tablet TAKE 1 TABLET BY MOUTH ONCE DAILY--FILL 08/09/15-- 02/25/16  Yes Janith Lima, MD  nystatin (MYCOSTATIN/NYSTOP) powder Apply topically 2 (two) times daily. 07/25/16  Yes Debbrah Alar, NP  progesterone (PROMETRIUM) 100 MG capsule 1 (ONE) CAPSULE, ORAL, AT BEDTIME 01/18/16  Yes [provider]  vitamin B-12 (CYANOCOBALAMIN) 1000 MCG tablet Take 2,000 mcg by mouth daily.   Yes [provider]  clotrimazole (LOTRIMIN) 1 % cream Apply to affected area 2 times daily 07/28/16   Pollina, Gwenyth Allegra, MD  fluconazole (DIFLUCAN) 200 MG tablet Take 1 tablet (200 mg total) by mouth daily. 07/28/16 08/04/16  Orpah Greek, MD  methylPREDNISolone (MEDROL DOSEPAK) 4 MG TBPK tablet As directed 07/28/16   Pollina, Gwenyth Allegra, MD    Family History Family History  Problem Relation Age of Onset  . Heart disease Father   . Osteoporosis Mother   . Cancer Maternal Grandmother        Breast and Uterine Cancer  . Hypertension Sister   . Diabetes Sister   . Colon polyps Sister   . Colon cancer Neg Hx   . Rectal cancer Neg Hx   . Stomach cancer Neg Hx   . Esophageal cancer Neg Hx     Social History Social History  Substance Use Topics  . Smoking status: Never Smoker  . Smokeless tobacco: Never Used  . Alcohol use No     Allergies   Patient has no known allergies.   Review of Systems Review of Systems  Constitutional: Negative for fever.  Skin: Positive for rash.  All other systems reviewed and are negative.    Physical Exam Updated Vital Signs BP 134/85 (BP Location: Right Arm)   Pulse 78   Temp 98.3 F (36.8 C) (Oral)   Resp 18   Ht 5\' 6"  (1.676 m)   Wt 192 lb (87.1 kg)   SpO2 97%   BMI 30.99 kg/m   Physical Exam  Constitutional: She is oriented to person, place, and time. She appears well-developed and well-nourished. No distress.  HENT:  Head: Normocephalic and atraumatic.  Right Ear: Hearing normal.  Left Ear: Hearing normal.  Nose: Nose normal.  Mouth/Throat: Oropharynx is clear and moist and mucous membranes are normal.  Eyes: Conjunctivae and EOM are normal. Pupils are equal, round, and reactive to light.  Neck: Normal range of motion. Neck supple.  Cardiovascular: Regular rhythm, S1 normal and S2 normal.  Exam reveals no gallop and no  friction rub.   No murmur heard. Pulmonary/Chest: Effort normal and breath sounds normal. No respiratory distress. She exhibits no tenderness.  Abdominal: Soft. Normal appearance and bowel sounds are normal. There is no hepatosplenomegaly. There is no tenderness. There is no rebound, no guarding, no tenderness at McBurney's point and negative Murphy's sign. No hernia.  Musculoskeletal: Normal range of motion.  Neurological: She is alert and oriented to person, place, and time. She has normal strength. No cranial nerve deficit or sensory deficit. Coordination normal. GCS eye subscore is 4. GCS verbal subscore is 5. GCS motor subscore is 6.  Skin: Skin is warm, dry and intact. Rash noted. No cyanosis.  Erythematous patchy rash under both breasts. Erythematous patchy rash  with multiple satellite lesion under panus of abdomen. Multiple clear fluid-filled bullae on erythematous bases of left arm and shoulder.   Psychiatric: She has a normal mood and affect. Her speech is normal and behavior is normal. Thought content normal.  Nursing note and vitals reviewed.    ED Treatments / Results  DIAGNOSTIC STUDIES: Oxygen Saturation is 97% on RA, normal by my interpretation.    COORDINATION OF CARE: 11:10 PM- Pt advised of plan for treatment and pt agrees.  Labs (all labs ordered are listed, but only abnormal results are displayed) Labs Reviewed - No data to display  EKG  EKG Interpretation None       Radiology No results found.  Procedures Procedures (including critical care time)  Medications Ordered in ED Medications  dexamethasone (DECADRON) injection 10 mg (10 mg Intramuscular Given 07/28/16 2339)     Initial Impression / Assessment and Plan / ED Course  I have reviewed the triage vital signs and the nursing notes.  Pertinent labs & imaging results that were available during my care of the patient were reviewed by me and considered in my medical decision making (see chart for  details).     Patient presents with complaints of multiple rashes. Patient has been treated with nystatin powder for intertrigo. Symptoms are not improving. Looking at the areas she does have evidence of fungal infection under her breasts and under her pannus, these are not significantly whenever BP, nystatin powder is likely the wrong treatment. We'll give oral Diflucan and also topical fluconazole.  Patient has bullous contact dermatitis on her arm. She reports that she was working in her yard and fell over into a patch of poison oak. She has had similar reactions in the past. Will treat with Decadron and Medrol dose pack.  Final Clinical Impressions(s) / ED Diagnoses   Final diagnoses:  Allergic contact dermatitis due to plants, except food  Intertrigo    New Prescriptions New Prescriptions   CLOTRIMAZOLE (LOTRIMIN) 1 % CREAM    Apply to affected area 2 times daily   FLUCONAZOLE (DIFLUCAN) 200 MG TABLET    Take 1 tablet (200 mg total) by mouth daily.   METHYLPREDNISOLONE (MEDROL DOSEPAK) 4 MG TBPK TABLET    As directed   I personally performed the services described in this documentation, which was scribed in my presence. The recorded information has been reviewed and is accurate.     Orpah Greek, MD 07/28/16 [phone]

## 2016-07-28 NOTE — Telephone Encounter (Addendum)
Relation to pt: self  Call back [phone] Pharmacy: [facility] - [place], [place] - [address] [phone] (Phone) [phone] (Fax)     Reason for call:   Patient was last seen 07/25/16 by NP for rash outbreak, patient wanted to inform new area rash outbreak: left arm and buttocks , patient states predisone works for her requesting Rx. Patient will be traveling to [facility] Sunday and would like Rx prescribe prior to vacation. Please advise

## 2016-07-28 NOTE — ED Triage Notes (Signed)
Pt recently seen by PCP for intertrigo from poison ivy, she used meds diflucan and nystatin as prescribed. Rash is now blistering.

## 2016-07-29 NOTE — Telephone Encounter (Signed)
Attempted to reach pt at contact # below but just received fast busy signal x 2. Sent [employer].

## 2016-07-29 NOTE — Telephone Encounter (Signed)
Attempted to reach pt by phone x 3 and no voicemail, no answer. Sent Kelsey Church.

## 2016-07-29 NOTE — Telephone Encounter (Signed)
I would recommend that she start the medrol dose pak which was prescribed by er physician yesterday.  This is very similar to prednisone and should help.

## 2016-08-09 ENCOUNTER — Ambulatory Visit (INDEPENDENT_AMBULATORY_CARE_PROVIDER_SITE_OTHER): Payer: Medicare Other | Admitting: Family Medicine

## 2016-08-09 ENCOUNTER — Encounter: Payer: Self-pay | Admitting: Family Medicine

## 2016-08-09 VITALS — BP 132/66 | HR 82 | Temp 98.6°F | Resp 16 | Ht 66.0 in | Wt 191.2 lb

## 2016-08-09 DIAGNOSIS — L237 Allergic contact dermatitis due to plants, except food: Secondary | ICD-10-CM

## 2016-08-09 DIAGNOSIS — R21 Rash and other nonspecific skin eruption: Secondary | ICD-10-CM

## 2016-08-09 LAB — CBC WITH DIFFERENTIAL/PLATELET
BASOS ABS: 0.1 10*3/uL (ref 0.0–0.1)
BASOS PCT: 0.7 % (ref 0.0–3.0)
Eosinophils Absolute: 0.4 10*3/uL (ref 0.0–0.7)
Eosinophils Relative: 5.1 % — ABNORMAL HIGH (ref 0.0–5.0)
HEMATOCRIT: 40.6 % (ref 36.0–46.0)
Hemoglobin: 13.2 g/dL (ref 12.0–15.0)
LYMPHS PCT: 19.8 % (ref 12.0–46.0)
Lymphs Abs: 1.7 10*3/uL (ref 0.7–4.0)
MCHC: 32.4 g/dL (ref 30.0–36.0)
MCV: 84.5 fl (ref 78.0–100.0)
MONOS PCT: 8.7 % (ref 3.0–12.0)
Monocytes Absolute: 0.7 10*3/uL (ref 0.1–1.0)
NEUTROS ABS: 5.5 10*3/uL (ref 1.4–7.7)
Neutrophils Relative %: 65.7 % (ref 43.0–77.0)
PLATELETS: 271 10*3/uL (ref 150.0–400.0)
RBC: 4.81 Mil/uL (ref 3.87–5.11)
RDW: 15.2 % (ref 11.5–15.5)
WBC: 8.4 10*3/uL (ref 4.0–10.5)

## 2016-08-09 LAB — COMPREHENSIVE METABOLIC PANEL
ALK PHOS: 72 U/L (ref 39–117)
ALT: 19 U/L (ref 0–35)
AST: 15 U/L (ref 0–37)
Albumin: 3.8 g/dL (ref 3.5–5.2)
BILIRUBIN TOTAL: 0.4 mg/dL (ref 0.2–1.2)
BUN: 34 mg/dL — ABNORMAL HIGH (ref 6–23)
CALCIUM: 9.1 mg/dL (ref 8.4–10.5)
CO2: 28 meq/L (ref 19–32)
Chloride: 105 mEq/L (ref 96–112)
Creatinine, Ser: 1.3 mg/dL — ABNORMAL HIGH (ref 0.40–1.20)
GFR: 43.56 mL/min — ABNORMAL LOW (ref >60.00)
Glucose, Bld: 79 mg/dL (ref 70–99)
POTASSIUM: 3.9 meq/L (ref 3.5–5.1)
Sodium: 139 mEq/L (ref 135–145)
Total Protein: 6.3 g/dL (ref 6.0–8.3)

## 2016-08-09 MED ORDER — METHYLPREDNISOLONE ACETATE 80 MG/ML IJ SUSP
80.0000 mg | Freq: Once | INTRAMUSCULAR | Status: AC
  Administered 2016-08-09: 80 mg via INTRAMUSCULAR

## 2016-08-09 MED ORDER — PREDNISONE 10 MG PO TABS: ORAL_TABLET | ORAL | 0 refills | Status: DC

## 2016-08-09 NOTE — Patient Instructions (Signed)
Poison Ivy Dermatitis Poison ivy dermatitis is redness and soreness (inflammation) of the skin. It is caused by a chemical that is found on the leaves of the poison ivy plant. You may also have itching, a rash, and blisters. Symptoms often clear up in 1-2 weeks. You may get this condition by touching a poison ivy plant. You can also get it by touching something that has the chemical on it. This may include animals or objects that have come in contact with the plant. Follow these instructions at home: General instructions  Take or apply over-the-counter and prescription medicines only as told by your doctor.  If you touch poison ivy, wash your skin with soap and cold water right away.  Use hydrocortisone creams or calamine lotion as needed to help with itching.  Take oatmeal baths as needed. Use colloidal oatmeal. You can get this at a pharmacy or grocery store. Follow the instructions on the package.  Do not scratch or rub your skin.  While you have the rash, wash your clothes right after you wear them. Prevention  Know what poison ivy looks like so you can avoid it. This plant has three leaves with flowering branches on a single stem. The leaves are glossy. They have uneven edges that come to a point at the front.  If you have touched poison ivy, wash with soap and water right away. Be sure to wash under your fingernails.  When hiking or camping, wear long pants, a long-sleeved shirt, tall socks, and hiking boots. You can also use a lotion on your skin that helps to prevent contact with the chemical on the plant.  If you think that your clothes or outdoor gear came in contact with poison ivy, rinse them off with a garden hose before you bring them inside your house. Contact a doctor if:  You have open sores in the rash area.  You have more redness, swelling, or pain in the affected area.  You have redness that spreads beyond the rash area.  You have fluid, blood, or pus coming from  the affected area.  You have a fever.  You have a rash over a large area of your body.  You have a rash on your eyes, mouth, or genitals.  Your rash does not get better after a few days. Get help right away if:  Your face swells or your eyes swell shut.  You have trouble breathing.  You have trouble swallowing. This information is not intended to replace advice given to you by your health care provider. Make sure you discuss any questions you have with your health care provider. Document Released: 03/05/2010 Document Revised: 07/09/2015 Document Reviewed: 07/09/2014 Elsevier Interactive Patient Education  [employer].

## 2016-08-09 NOTE — Progress Notes (Signed)
Patient ID: Kelsey Church, female   DOB: 27-Aug-1950, 66 y.o.   MRN: [id number]     Subjective:  I acted as a Education administrator for Dr. Carollee Herter.  Guerry Bruin, [place]   Patient ID: Kelsey Church, female    DOB: September 24, 1950, 66 y.o.   MRN: [id number]  Chief Complaint  Patient presents with  . Poison [employer]  Episode onset: 3 weeks ago. The affected locations include the left arm, right arm, torso and right buttock. The rash is characterized by burning, redness and itchiness (welping). Pertinent negatives include no anorexia, congestion, cough, diarrhea, eye pain, facial edema, fatigue, fever, joint pain, nail changes, rhinorrhea, shortness of breath, sore throat or vomiting. Past treatments include oral steroids and anti-itch cream (calamine lotions).    Patient is in today for poison ivy.  She has had two visits to doctors for this same issue.    Patient Care Team: Janith Lima, MD as PCP - General (Internal Medicine)   Past Medical History:  Diagnosis Date  . Arthritis    PAIN AND OA LEFT KNEE; S/P RIGHT TOTAL KNEE ARTHROPLASTY 06/10/13  . Cancer ([place])    melanoma in 2 sights  . Complication of anesthesia    "chills every evening at sundown x 2 weeks"- no fever  . Depression   . Dysphagia   . GERD (gastroesophageal reflux disease)   . History of shingles    NO RESIDUAL PROBLEMS  . Hypertension   . Melanoma in situ ([place])   . Pain    LOWER BACK PAIN  . Sleep apnea 09/2011   CPAP  . Sleep apnea     Past Surgical History:  Procedure Laterality Date  . APPENDECTOMY  1958  . COLONOSCOPY    . COLONOSCOPY W/ POLYPECTOMY     7 polyps  . FOOT SURGERY Right    removal plantar wart  . GALLBLADDER SURGERY  02/14/1990  . INJECTION KNEE  09/2011   right   . KNEE ARTHROSCOPY  1996  . MELANOMA EXCISION  1992   right chin, back  . POLYPECTOMY    . SALPINGOOPHORECTOMY  2009   Right  . TOTAL KNEE ARTHROPLASTY Right 06/10/2013   Procedure: RIGHT TOTAL KNEE ARTHROPLASTY;   Surgeon: [name], MD;  Location: [facility];  Service: Orthopedics;  Laterality: Right;  . TOTAL KNEE ARTHROPLASTY Left 12/16/2013   Procedure: LEFT TOTAL KNEE ARTHROPLASTY;  Surgeon: [name], MD;  Location: [facility];  Service: Orthopedics;  Laterality: Left;    Family History  Problem Relation Age of Onset  . Heart disease Father   . Osteoporosis Mother   . Cancer Maternal Grandmother        Breast and Uterine Cancer  . Hypertension Sister   . Diabetes Sister   . Colon polyps Sister   . Colon cancer Neg Hx   . Rectal cancer Neg Hx   . Stomach cancer Neg Hx   . Esophageal cancer Neg Hx     Social History   Social History  . Marital status: Widowed    Spouse name: N/A  . Number of children: 1  . Years of education: N/A   Occupational History  . [employer]   Social History Main Topics  . Smoking status: Never Smoker  . Smokeless tobacco: Never Used  . Alcohol use No  . Drug use: No  . Sexual activity: Not Currently   Other Topics Concern  . Not on file  Social History Narrative   Caffienated drinks-no   Seat belt use often-yes   Regular Exercise-yes   Smoke alarm in the home-yes   Firearms/guns in the home-no   History of physical abuse-no                Outpatient Medications Prior to Visit  Medication Sig Dispense Refill  . citalopram (CELEXA) 20 MG tablet TAKE 1 TABLET (20 MG TOTAL) BY MOUTH AT BEDTIME. 90 tablet 3  . Diclofenac Sodium (PENNSAID) 2 % SOLN Place 1 application onto the skin 2 (two) times daily as needed. 112 g 0  . ESTRADIOL TD Place onto the skin.    Marland Kitchen losartan-hydrochlorothiazide (HYZAAR) 100-25 MG tablet TAKE 1 TABLET BY MOUTH DAILY. 90 tablet 1  . meloxicam (MOBIC) 15 MG tablet TAKE 1 TABLET BY MOUTH ONCE DAILY--FILL 08/09/15-- 90 tablet 1  . progesterone (PROMETRIUM) 100 MG capsule 1 (ONE) CAPSULE, ORAL, AT BEDTIME  4  . vitamin B-12 (CYANOCOBALAMIN) 1000 MCG tablet Take 2,000 mcg by mouth daily.    . clotrimazole  (LOTRIMIN) 1 % cream Apply to affected area 2 times daily 15 g 0  . methylPREDNISolone (MEDROL DOSEPAK) 4 MG TBPK tablet As directed 21 tablet 0  . nystatin (MYCOSTATIN/NYSTOP) powder Apply topically 2 (two) times daily. 45 g 0   No facility-administered medications prior to visit.     No Known Allergies  Review of Systems  Constitutional: Negative for fatigue, fever and malaise/fatigue.  HENT: Negative for congestion, rhinorrhea and sore throat.   Eyes: Negative for blurred vision and pain.  Respiratory: Negative for cough and shortness of breath.   Cardiovascular: Negative for chest pain, palpitations and leg swelling.  Gastrointestinal: Negative for anorexia, diarrhea and vomiting.  Musculoskeletal: Negative for back pain and joint pain.  Skin: Positive for itching. Negative for nail changes and rash.  Neurological: Negative for loss of consciousness and headaches.       Objective:    Physical Exam  BP 132/66 (BP Location: Right Arm, Cuff Size: Normal)   Pulse 82   Temp 98.6 F (37 C) (Oral)   Resp 16   Ht '5\' 6"'  (1.676 m)   Wt 191 lb 3.2 oz (86.7 kg)   SpO2 97%   BMI 30.86 kg/m  Wt Readings from Last 3 Encounters:  08/09/16 191 lb 3.2 oz (86.7 kg)  07/28/16 192 lb (87.1 kg)  07/25/16 193 lb 6.4 oz (87.7 kg)   BP Readings from Last 3 Encounters:  08/09/16 132/66  07/28/16 134/85  07/25/16 (!) 122/59     Immunization History  Administered Date(s) Administered  . Hepatitis A 08/07/2008, 07/28/2010  . Hepatitis B 10/15/1984, 01/14/1985, 07/15/1996, 08/24/2009  . Influenza Nasal 12/16/2011  . Influenza Whole 10/16/2010  . Influenza,inj,Quad PF,36+ Mos 12/03/2013, 12/10/2014  . MMR 08/13/2008  . OPV 08/12/1958  . Pneumococcal Conjugate-13 03/29/2016  . Td 07/04/2006  . Tdap 05/16/2013  . Typhoid Inactivated 07/18/2012  . Yellow Fever 07/18/2012  . Zoster 09/06/2012    Health Maintenance  Topic Date Due  . Hepatitis C Screening  02-23-1950  . HIV  Screening  08/11/1965  . INFLUENZA VACCINE  09/14/2016  . PAP SMEAR  10/31/2016  . PNA vac Low Risk Adult (2 of 2 - PPSV23) 03/29/2017  . MAMMOGRAM  02/23/2018  . COLONOSCOPY  01/22/2020  . TETANUS/TDAP  05/17/2023  . DEXA SCAN  Completed    Lab Results  Component Value Date   WBC 8.4 08/09/2016   HGB 13.2  08/09/2016   HCT 40.6 08/09/2016   PLT 271.0 08/09/2016   GLUCOSE 79 08/09/2016   CHOL 190 03/29/2016   TRIG 161.0 (H) 03/29/2016   HDL 53.50 03/29/2016   LDLCALC 104 (H) 03/29/2016   ALT 19 08/09/2016   AST 15 08/09/2016   NA 139 08/09/2016   K 3.9 08/09/2016   CL 105 08/09/2016   CREATININE 1.30 (H) 08/09/2016   BUN 34 (H) 08/09/2016   CO2 28 08/09/2016   TSH 1.53 03/29/2016   INR 1.17 12/27/2013   HGBA1C 6.1 03/29/2016    Lab Results  Component Value Date   TSH 1.53 03/29/2016   Lab Results  Component Value Date   WBC 8.4 08/09/2016   HGB 13.2 08/09/2016   HCT 40.6 08/09/2016   MCV 84.5 08/09/2016   PLT 271.0 08/09/2016   Lab Results  Component Value Date   NA 139 08/09/2016   K 3.9 08/09/2016   CO2 28 08/09/2016   GLUCOSE 79 08/09/2016   BUN 34 (H) 08/09/2016   CREATININE 1.30 (H) 08/09/2016   BILITOT 0.4 08/09/2016   ALKPHOS 72 08/09/2016   AST 15 08/09/2016   ALT 19 08/09/2016   PROT 6.3 08/09/2016   ALBUMIN 3.8 08/09/2016   CALCIUM 9.1 08/09/2016   ANIONGAP 11 12/27/2013   GFR 43.56 (L) 08/09/2016   Lab Results  Component Value Date   CHOL 190 03/29/2016   Lab Results  Component Value Date   HDL 53.50 03/29/2016   Lab Results  Component Value Date   LDLCALC 104 (H) 03/29/2016   Lab Results  Component Value Date   TRIG 161.0 (H) 03/29/2016   Lab Results  Component Value Date   CHOLHDL 4 03/29/2016   Lab Results  Component Value Date   HGBA1C 6.1 03/29/2016         Assessment & Plan:   Problem List Items Addressed This Visit    None    Visit Diagnoses    Allergic contact dermatitis due to plants, except food    -   Primary   Relevant Medications   predniSONE (DELTASONE) 10 MG tablet   methylPREDNISolone acetate (DEPO-MEDROL) injection 80 mg (Completed)   Other Relevant Orders   CBC with Differential/Platelet (Completed)   Comprehensive metabolic panel (Completed)   Rash and nonspecific skin eruption       Relevant Medications   methylPREDNISolone acetate (DEPO-MEDROL) injection 80 mg (Completed)   Other Relevant Orders   CBC with Differential/Platelet (Completed)      I have discontinued Ms. Blixt's nystatin, clotrimazole, and methylPREDNISolone. I am also having her start on predniSONE. Additionally, I am having her maintain her citalopram, Diclofenac Sodium, meloxicam, ESTRADIOL TD, progesterone, losartan-hydrochlorothiazide, and vitamin B-12. We administered methylPREDNISolone acetate.  Meds ordered this encounter  Medications  . predniSONE (DELTASONE) 10 MG tablet    Sig: TAKE 3 TABLETS PO QD FOR 3 DAYS THEN TAKE 2 TABLETS PO QD FOR 3 DAYS THEN TAKE 1 TABLET PO QD FOR 3 DAYS THEN TAKE 1/2 TAB PO QD FOR 3 DAYS    Dispense:  20 tablet    Refill:  0  . methylPREDNISolone acetate (DEPO-MEDROL) injection 80 mg    .CMA served as Education administrator during this visit. History, Physical and Plan performed by medical provider. Documentation and orders reviewed and attested to.  Ann Held, DO

## 2016-08-10 ENCOUNTER — Ambulatory Visit: Payer: Medicare Other | Admitting: Family

## 2016-08-10 DIAGNOSIS — M5136 Other intervertebral disc degeneration, lumbar region: Secondary | ICD-10-CM | POA: Diagnosis not present

## 2016-08-10 DIAGNOSIS — M9903 Segmental and somatic dysfunction of lumbar region: Secondary | ICD-10-CM | POA: Diagnosis not present

## 2016-08-10 DIAGNOSIS — M5137 Other intervertebral disc degeneration, lumbosacral region: Secondary | ICD-10-CM | POA: Diagnosis not present

## 2016-08-10 DIAGNOSIS — M9901 Segmental and somatic dysfunction of cervical region: Secondary | ICD-10-CM | POA: Diagnosis not present

## 2016-08-10 DIAGNOSIS — M531 Cervicobrachial syndrome: Secondary | ICD-10-CM | POA: Diagnosis not present

## 2016-08-10 DIAGNOSIS — M9904 Segmental and somatic dysfunction of sacral region: Secondary | ICD-10-CM | POA: Diagnosis not present

## 2016-08-10 DIAGNOSIS — H2513 Age-related nuclear cataract, bilateral: Secondary | ICD-10-CM | POA: Diagnosis not present

## 2016-08-11 ENCOUNTER — Encounter: Payer: Self-pay | Admitting: Family Medicine

## 2016-08-12 DIAGNOSIS — L602 Onychogryphosis: Secondary | ICD-10-CM | POA: Diagnosis not present

## 2016-08-12 DIAGNOSIS — L600 Ingrowing nail: Secondary | ICD-10-CM | POA: Diagnosis not present

## 2016-08-12 DIAGNOSIS — L03031 Cellulitis of right toe: Secondary | ICD-10-CM | POA: Diagnosis not present

## 2016-08-18 ENCOUNTER — Other Ambulatory Visit: Payer: Self-pay | Admitting: Internal Medicine

## 2016-09-20 DIAGNOSIS — M9903 Segmental and somatic dysfunction of lumbar region: Secondary | ICD-10-CM | POA: Diagnosis not present

## 2016-09-20 DIAGNOSIS — M5137 Other intervertebral disc degeneration, lumbosacral region: Secondary | ICD-10-CM | POA: Diagnosis not present

## 2016-09-20 DIAGNOSIS — M9901 Segmental and somatic dysfunction of cervical region: Secondary | ICD-10-CM | POA: Diagnosis not present

## 2016-09-20 DIAGNOSIS — M5136 Other intervertebral disc degeneration, lumbar region: Secondary | ICD-10-CM | POA: Diagnosis not present

## 2016-09-20 DIAGNOSIS — M9904 Segmental and somatic dysfunction of sacral region: Secondary | ICD-10-CM | POA: Diagnosis not present

## 2016-09-20 DIAGNOSIS — M531 Cervicobrachial syndrome: Secondary | ICD-10-CM | POA: Diagnosis not present

## 2016-10-15 DIAGNOSIS — Z23 Encounter for immunization: Secondary | ICD-10-CM | POA: Diagnosis not present

## 2016-10-20 DIAGNOSIS — M9903 Segmental and somatic dysfunction of lumbar region: Secondary | ICD-10-CM | POA: Diagnosis not present

## 2016-10-20 DIAGNOSIS — M9901 Segmental and somatic dysfunction of cervical region: Secondary | ICD-10-CM | POA: Diagnosis not present

## 2016-10-20 DIAGNOSIS — M9904 Segmental and somatic dysfunction of sacral region: Secondary | ICD-10-CM | POA: Diagnosis not present

## 2016-10-20 DIAGNOSIS — M531 Cervicobrachial syndrome: Secondary | ICD-10-CM | POA: Diagnosis not present

## 2016-10-20 DIAGNOSIS — M5136 Other intervertebral disc degeneration, lumbar region: Secondary | ICD-10-CM | POA: Diagnosis not present

## 2016-10-20 DIAGNOSIS — M5137 Other intervertebral disc degeneration, lumbosacral region: Secondary | ICD-10-CM | POA: Diagnosis not present

## 2016-10-24 ENCOUNTER — Telehealth: Payer: Self-pay | Admitting: Family Medicine

## 2016-10-24 NOTE — Telephone Encounter (Signed)
Pt req email immunization record to her at [email or web address].  Pt state if we are unable to email it to her she would like it mailed to her [address], [place]  [phone].   Call pt and confirm [phone].

## 2016-10-25 NOTE — Telephone Encounter (Signed)
Immunization report printed; forwarded to [place] for email/SLS 09/11

## 2016-10-26 DIAGNOSIS — M5137 Other intervertebral disc degeneration, lumbosacral region: Secondary | ICD-10-CM | POA: Diagnosis not present

## 2016-10-26 DIAGNOSIS — M9904 Segmental and somatic dysfunction of sacral region: Secondary | ICD-10-CM | POA: Diagnosis not present

## 2016-10-26 DIAGNOSIS — M531 Cervicobrachial syndrome: Secondary | ICD-10-CM | POA: Diagnosis not present

## 2016-10-26 DIAGNOSIS — M9901 Segmental and somatic dysfunction of cervical region: Secondary | ICD-10-CM | POA: Diagnosis not present

## 2016-10-26 DIAGNOSIS — M9903 Segmental and somatic dysfunction of lumbar region: Secondary | ICD-10-CM | POA: Diagnosis not present

## 2016-10-26 DIAGNOSIS — M5136 Other intervertebral disc degeneration, lumbar region: Secondary | ICD-10-CM | POA: Diagnosis not present

## 2016-10-27 DIAGNOSIS — Z23 Encounter for immunization: Secondary | ICD-10-CM | POA: Diagnosis not present

## 2016-10-31 NOTE — Progress Notes (Signed)
[name] [address] [place], [place] [address] Phone: [phone] Subjective:     CC:  Low back pain  CBS:[id number]  Kelsey Church is a 66 y.o. female coming in with complaint of low back pain. Since June, fell on her butt. Sensation is having worsening back pain. States over the course of time a dull, throbbing aching sensation as noted. Patient states that it seems to be radiating down her legs. States that with increasing activity is having worsening pain. Has to stop even with walking 200 300 feet intake arrest. In addition of this patient has noticed may be feeling like her legs will give out on her. States that when she sits it seems to get better. He had Advil to very r regularly Rates the severity pain is 10 out of 10 sometimes     Past Medical History:  Diagnosis Date  . Arthritis    PAIN AND OA LEFT KNEE; S/P RIGHT TOTAL KNEE ARTHROPLASTY 06/10/13  . Cancer ([place])    melanoma in 2 sights  . Complication of anesthesia    "chills every evening at sundown x 2 weeks"- no fever  . Depression   . Dysphagia   . GERD (gastroesophageal reflux disease)   . History of shingles    NO RESIDUAL PROBLEMS  . Hypertension   . Melanoma in situ ([place])   . Pain    LOWER BACK PAIN  . Sleep apnea 09/2011   CPAP  . Sleep apnea    Past Surgical History:  Procedure Laterality Date  . APPENDECTOMY  1958  . COLONOSCOPY    . COLONOSCOPY W/ POLYPECTOMY     7 polyps  . FOOT SURGERY Right    removal plantar wart  . GALLBLADDER SURGERY  02/14/1990  . INJECTION KNEE  09/2011   right   . KNEE ARTHROSCOPY  1996  . MELANOMA EXCISION  1992   right chin, back  . POLYPECTOMY    . SALPINGOOPHORECTOMY  2009   Right  . TOTAL KNEE ARTHROPLASTY Right 06/10/2013   Procedure: RIGHT TOTAL KNEE ARTHROPLASTY;  Surgeon: [name], MD;  Location: [facility];  Service: Orthopedics;  Laterality: Right;  . TOTAL KNEE ARTHROPLASTY Left 12/16/2013   Procedure: LEFT TOTAL  KNEE ARTHROPLASTY;  Surgeon: [name], MD;  Location: [facility];  Service: Orthopedics;  Laterality: Left;   Social History   Social History  . Marital status: Widowed    Spouse name: N/A  . Number of children: 1  . Years of education: N/A   Occupational History  . [employer]   Social History Main Topics  . Smoking status: Never Smoker  . Smokeless tobacco: Never Used  . Alcohol use No  . Drug use: No  . Sexual activity: Not Currently   Other Topics Concern  . Not on file   Social History Narrative   Caffienated drinks-no   Seat belt use often-yes   Regular Exercise-yes   Smoke alarm in the home-yes   Firearms/guns in the home-no   History of physical abuse-no               No Known Allergies Family History  Problem Relation Age of Onset  . Heart disease Father   . Osteoporosis Mother   . Cancer Maternal Grandmother        Breast and Uterine Cancer  . Hypertension Sister   . Diabetes Sister   . Colon polyps Sister   .  Colon cancer Neg Hx   . Rectal cancer Neg Hx   . Stomach cancer Neg Hx   . Esophageal cancer Neg Hx      Past medical history, social, surgical and family history all reviewed in electronic medical record.  No pertanent information unless stated regarding to the chief complaint.   Review of Systems:Review of systems updated and as accurate as of 10/31/16  No headache, visual changes, nausea, vomiting, diarrhea, constipation, dizziness, abdominal pain, skin rash, fevers, chills, night sweats, weight loss, swollen lymph nodes, body aches, joint swelling, chest pain, shortness of breath, mood changes. Positive muscle aches  Objective  There were no vitals taken for this visit. Systems examined below as of 10/31/16   General: No apparent distress alert and oriented x3 mood and affect normal, dressed appropriately.  HEENT: Pupils equal, extraocular movements intact  Respiratory: Patient's speak in full sentences and does not appear  short of breath  Cardiovascular: No lower extremity edema, non tender, no erythema  Skin: Warm dry intact with no signs of infection or rash on extremities or on axial skeleton.  Abdomen: Soft nontender  Neuro: Cranial nerves II through XII are intact, neurovascularly intact in all extremities with 2+ DTRs and 2+ pulses.  Lymph: No lymphadenopathy of posterior or anterior cervical chain or axillae bilaterally.  Gait normal with good balance and coordination.  MSK:  Non tender with full range of motion and good stability and symmetric strength and tone of shoulders, elbows, wrist, hip, knee and ankles bilaterally.  Back Exam:  Inspection: Mild loss of lordosis Motion: Flexion 25 deg, Extension 15 deg with worsening pain, Side Bending to 35 deg bilaterally,  Rotation to 35 deg bilaterally  SLR laying: Negative  XSLR laying: Negative  Palpable tenderness: Patient is in spinous process tenderness and the L5 vertebrae as well as appears palmar musculature around the SI joints bilaterally. FABER: negative. Sensory change: Gross sensation intact to all lumbar and sacral dermatomes.  Reflexes: 2+ at both patellar tendons, 2+ at achilles tendons, Babinski's downgoing.  Strength at foot  Plantar-flexion: 5/5 Dorsi-flexion: 5/5 Eversion: 5/5 Inversion: 5/5  Leg strength  Quad: 5/5 Hamstring: 5/5 Hip flexor: 5/5 Hip abductors: 4/5 but symmetric Gait unremarkable.    Impression and Recommendations:     This case required medical decision making of moderate complexity.      Note: This dictation was prepared with Dragon dictation along with smaller phrase technology. Any transcriptional errors that result from this process are unintentional.

## 2016-11-01 ENCOUNTER — Encounter: Payer: Self-pay | Admitting: Family Medicine

## 2016-11-01 ENCOUNTER — Ambulatory Visit (INDEPENDENT_AMBULATORY_CARE_PROVIDER_SITE_OTHER): Payer: Medicare Other | Admitting: Family Medicine

## 2016-11-01 ENCOUNTER — Ambulatory Visit (INDEPENDENT_AMBULATORY_CARE_PROVIDER_SITE_OTHER)
Admission: RE | Admit: 2016-11-01 | Discharge: 2016-11-01 | Disposition: A | Discharge status: Home / Self Care | Payer: Medicare Other | Source: Ambulatory Visit | Attending: Family Medicine | Admitting: Family Medicine

## 2016-11-01 VITALS — BP 116/70 | HR 76 | Ht 66.0 in | Wt 197.0 lb

## 2016-11-01 DIAGNOSIS — M5136 Other intervertebral disc degeneration, lumbar region: Secondary | ICD-10-CM | POA: Diagnosis not present

## 2016-11-01 DIAGNOSIS — M5416 Radiculopathy, lumbar region: Secondary | ICD-10-CM | POA: Diagnosis not present

## 2016-11-01 DIAGNOSIS — R102 Pelvic and perineal pain: Secondary | ICD-10-CM | POA: Diagnosis not present

## 2016-11-01 DIAGNOSIS — M51369 Other intervertebral disc degeneration, lumbar region without mention of lumbar back pain or lower extremity pain: Secondary | ICD-10-CM | POA: Insufficient documentation

## 2016-11-01 DIAGNOSIS — S3992XA Unspecified injury of lower back, initial encounter: Secondary | ICD-10-CM | POA: Diagnosis not present

## 2016-11-01 DIAGNOSIS — M545 Low back pain: Secondary | ICD-10-CM | POA: Diagnosis not present

## 2016-11-01 DIAGNOSIS — S3993XA Unspecified injury of pelvis, initial encounter: Secondary | ICD-10-CM | POA: Diagnosis not present

## 2016-11-01 IMAGING — DX DG PELVIS 1-2V
1 series · 1 of 1 positions shown · non-contrast
Comparison: None.

CLINICAL DATA: Recent fall, low back pain

EXAM:
PELVIS - 1-2 VIEW

[pelvis ap]
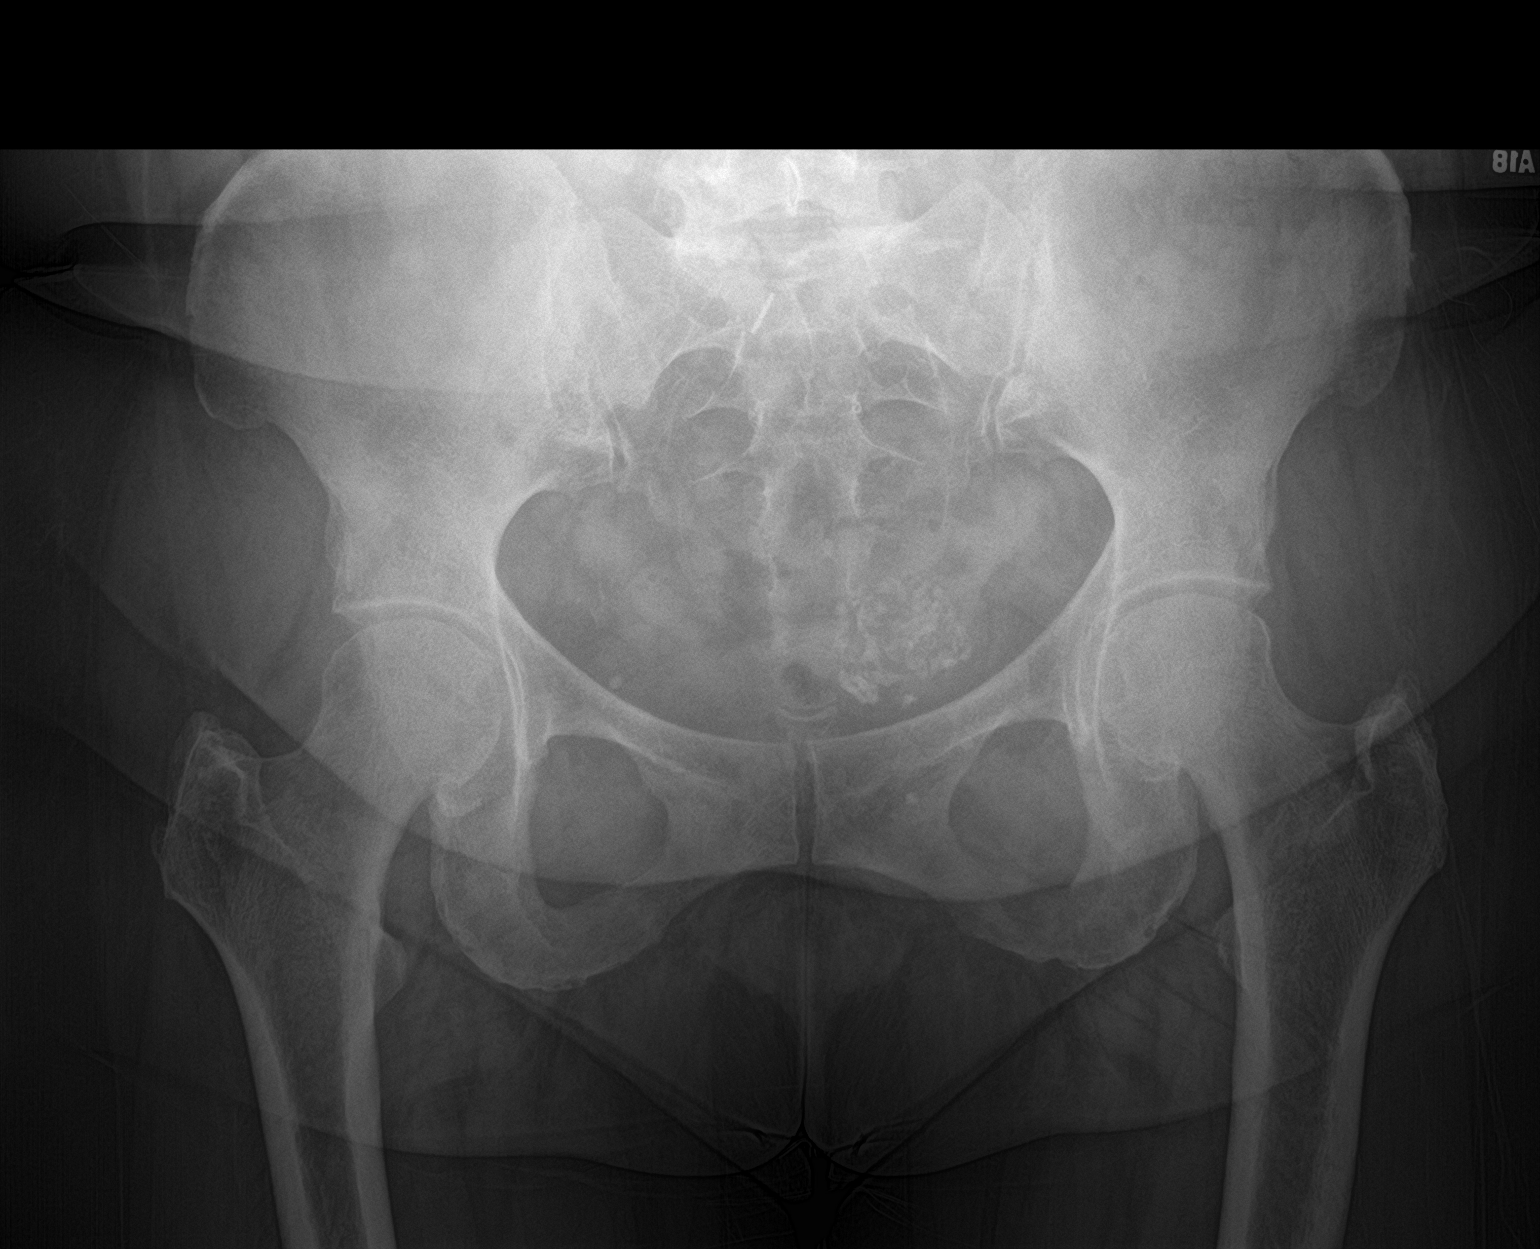

[1 of 1 positions shown; findings below may reference images not displayed]

FINDINGS: Both hips are normally positioned with well preserved hip joint
spaces. The pelvic rami are intact. The SI joints appear corticated.
The sacral foramina are unremarkable. No acute fracture is seen.
Calcifications in the left pelvis are most consistent with calcified
uterine fibroid.
IMPRESSION: No acute fracture.  Probable calcified uterine fibroid.

## 2016-11-01 IMAGING — DX DG LUMBAR SPINE COMPLETE 4+V
5 series · 5 of 5 positions shown · non-contrast
Comparison: None.

CLINICAL DATA: Recent fall, now with low back and sacral pain

EXAM:
LUMBAR SPINE - COMPLETE 4+ VIEW

[l-spine ap]
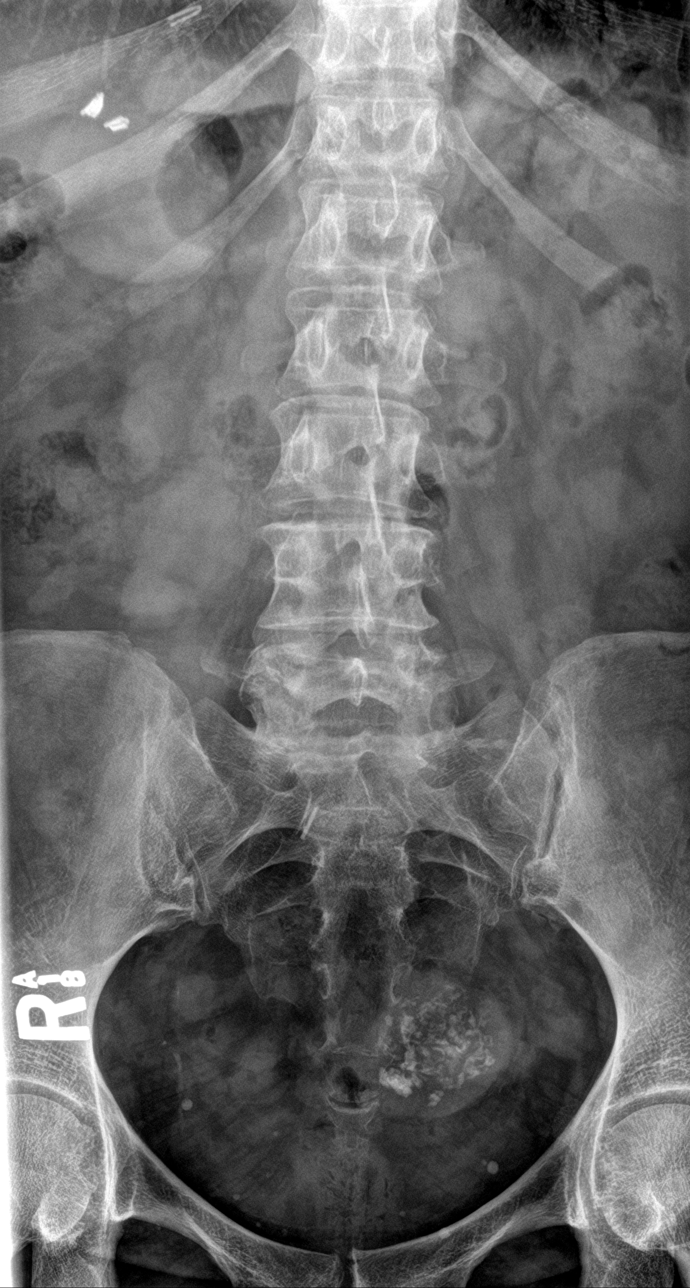

[l-spine obl (1 of 2)]
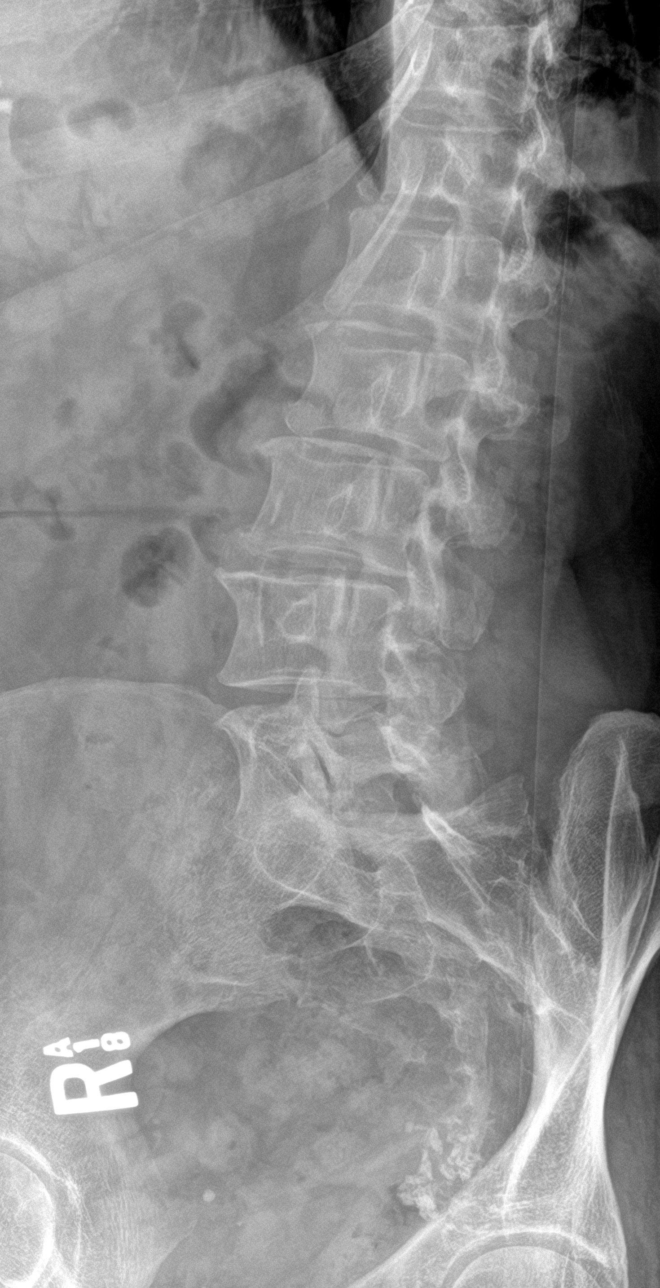

[l-spine obl (2 of 2)]
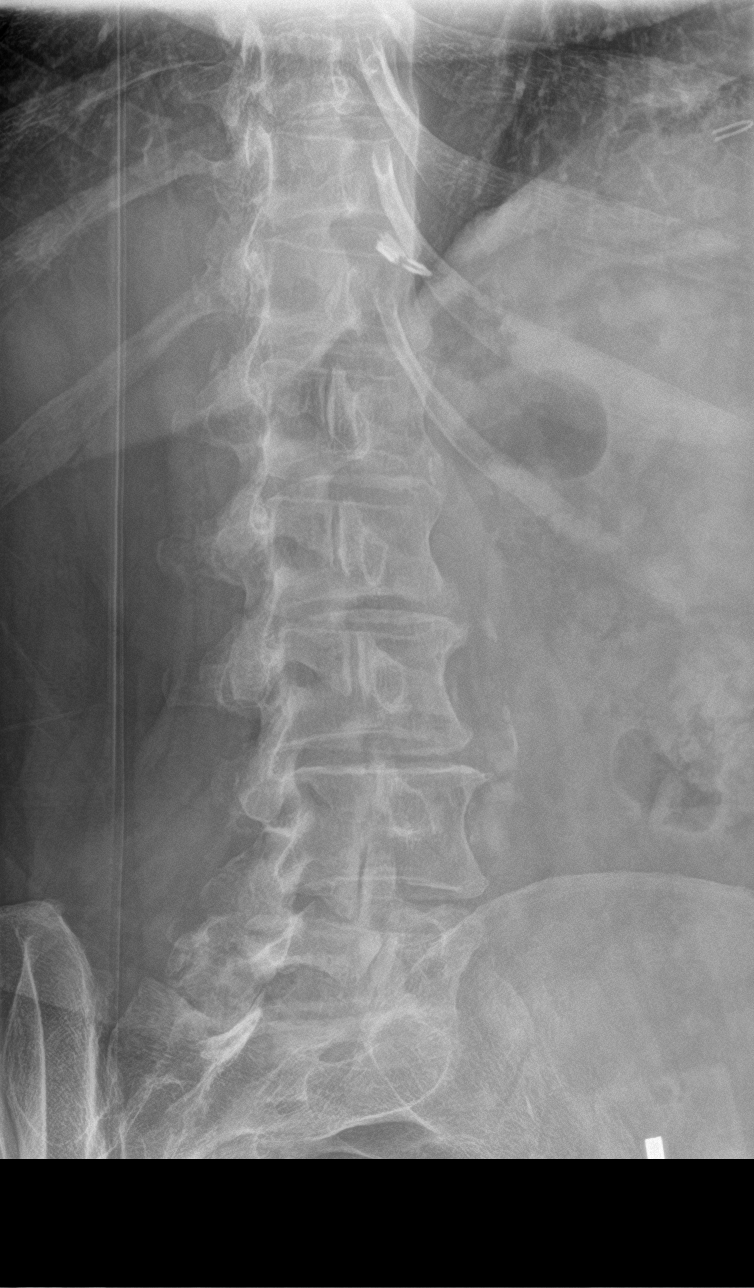

[l-spine lat]
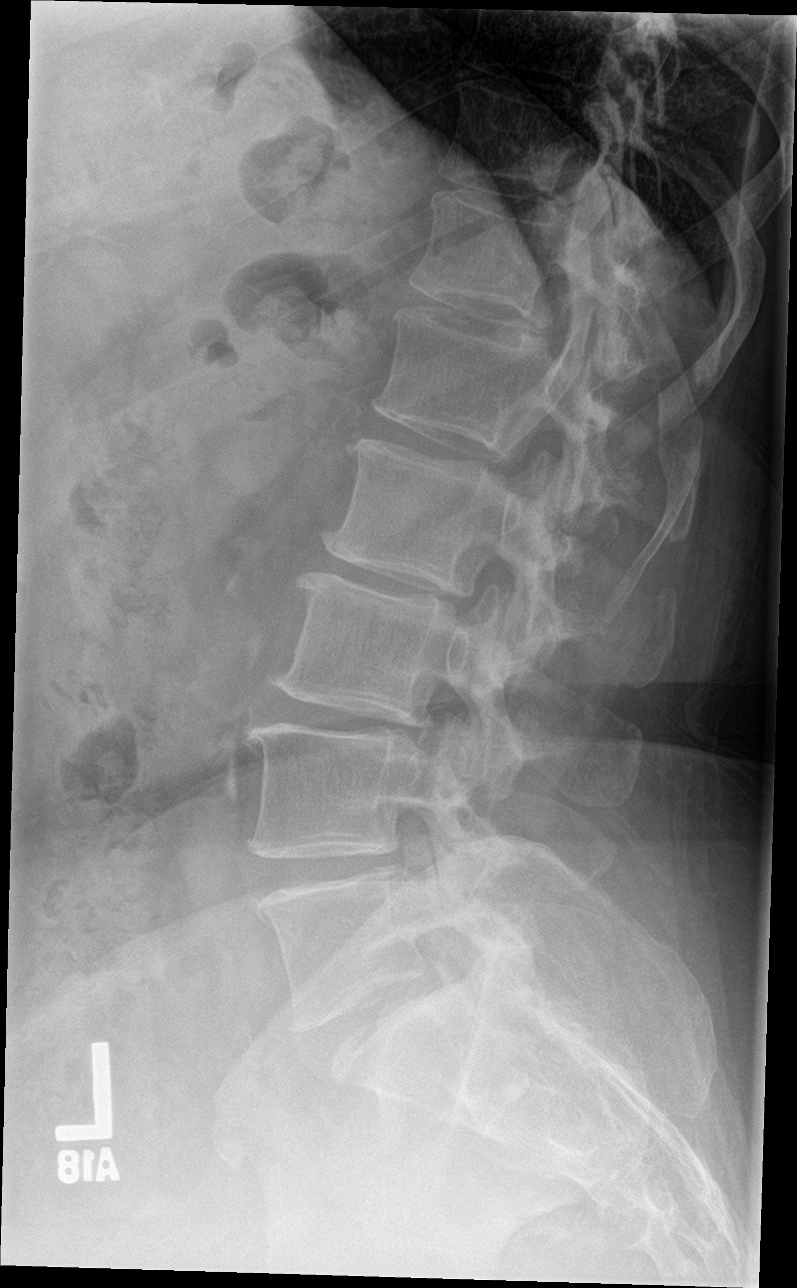

[l-spine spot]
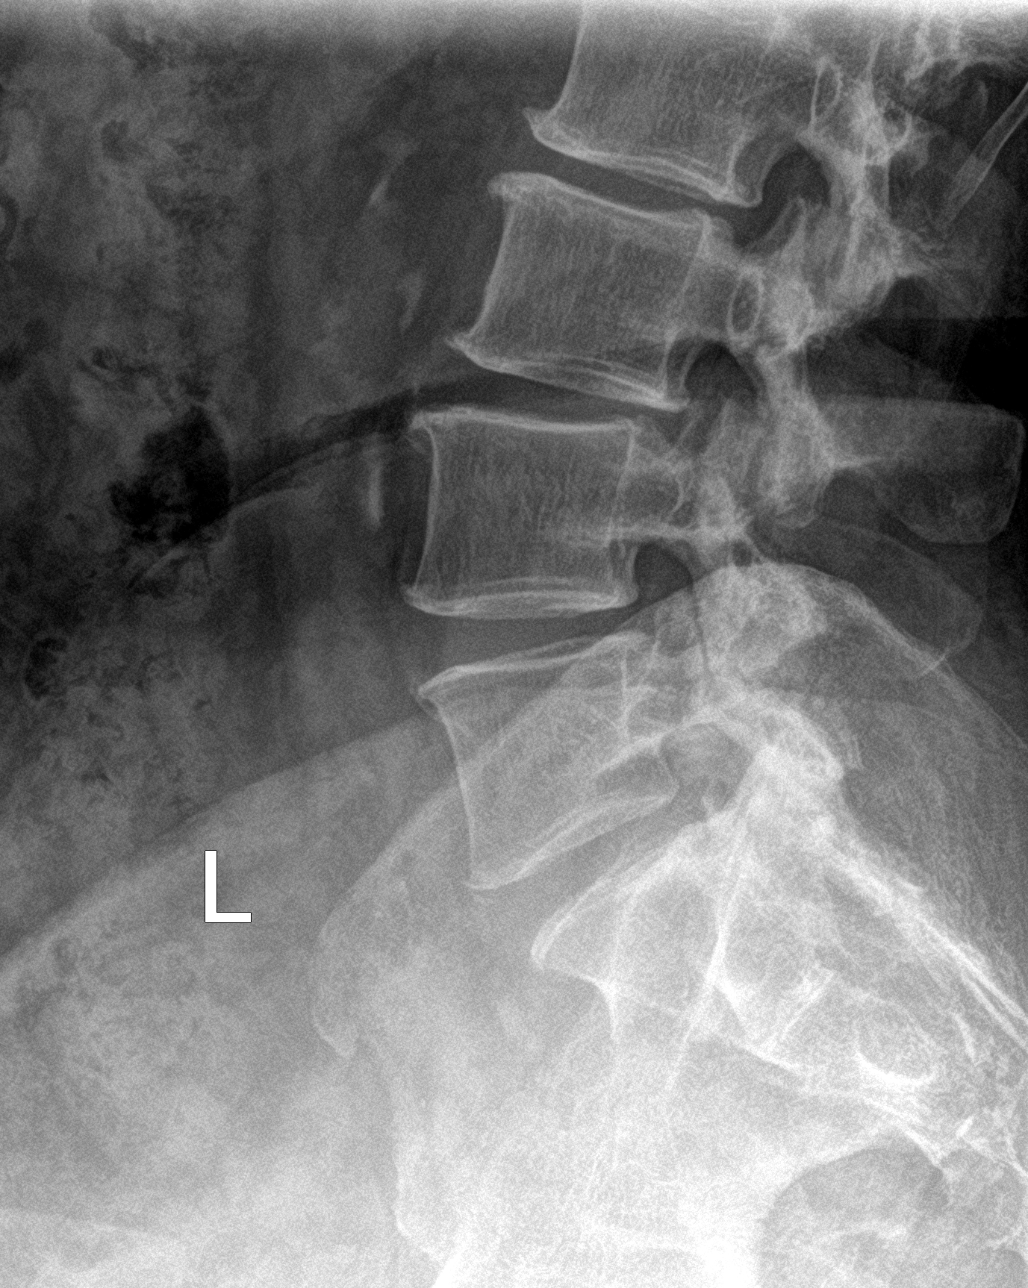

[5 of 5 positions shown; findings below may reference images not displayed]

FINDINGS: There is minimal retrolisthesis of L3 on L4 by 4 mm most likely due
to degenerative change of the facet joints. No compression deformity
is seen. Intervertebral disc spaces are relatively well preserved.
There is some degenerative change involving the facet joints
particularly of L5-S1. The SI joints are corticated. Calcifications
in the pelvis are most typical of calcified uterine fibroids.
IMPRESSION: 1. No acute fracture.
2. Normal intervertebral disc spaces. Mild retrolisthesis of L3 on
L4 most likely degenerative in origin.

## 2016-11-01 MED ORDER — VITAMIN D (ERGOCALCIFEROL) 1.25 MG (50000 UNIT) PO CAPS: 50000.0000 [IU] | ORAL_CAPSULE | ORAL | 0 refills | Status: DC

## 2016-11-01 MED ORDER — GABAPENTIN 100 MG PO CAPS: 200.0000 mg | ORAL_CAPSULE | Freq: Every day | ORAL | 3 refills | Status: DC

## 2016-11-01 MED ORDER — TIZANIDINE HCL 2 MG PO CAPS: 2.0000 mg | ORAL_CAPSULE | Freq: Three times a day (TID) | ORAL | 1 refills | Status: DC

## 2016-11-01 NOTE — Patient Instructions (Addendum)
Good to see you  Kelsey Church is your friend.  Stay active overall.  Xrays downstairs Exercises 3 times a week.  Gabapentin 200mg  at night Once weekly vitamin D for 12 weeks.  Zanaflex muscle relaxer IF you need it Over the counter turmeric 500mg  daily

## 2016-11-01 NOTE — Assessment & Plan Note (Signed)
Concerned the patient does have more of a spinal stenosis. Can be secondary to a compression fracture based on patient traumatic fall. We will get x-rays today. We'll need likely advance imaging of this continues. Mild weakness of the lower extremities but no deep tendon reflexes are intact. Patient given medications as stated including the gabapentin. Discussed with patient if worsening symptoms to come back sooner. Otherwise patient will follow-up with me again in 2-3 weeks

## 2016-11-05 DIAGNOSIS — M5137 Other intervertebral disc degeneration, lumbosacral region: Secondary | ICD-10-CM | POA: Diagnosis not present

## 2016-11-05 DIAGNOSIS — M5136 Other intervertebral disc degeneration, lumbar region: Secondary | ICD-10-CM | POA: Diagnosis not present

## 2016-11-05 DIAGNOSIS — M9903 Segmental and somatic dysfunction of lumbar region: Secondary | ICD-10-CM | POA: Diagnosis not present

## 2016-11-05 DIAGNOSIS — M9901 Segmental and somatic dysfunction of cervical region: Secondary | ICD-10-CM | POA: Diagnosis not present

## 2016-11-05 DIAGNOSIS — M9904 Segmental and somatic dysfunction of sacral region: Secondary | ICD-10-CM | POA: Diagnosis not present

## 2016-11-05 DIAGNOSIS — M531 Cervicobrachial syndrome: Secondary | ICD-10-CM | POA: Diagnosis not present

## 2016-11-07 ENCOUNTER — Telehealth: Payer: Self-pay | Admitting: *Deleted

## 2016-11-07 ENCOUNTER — Other Ambulatory Visit: Payer: Self-pay | Admitting: Obstetrics & Gynecology

## 2016-11-07 DIAGNOSIS — Z1231 Encounter for screening mammogram for malignant neoplasm of breast: Secondary | ICD-10-CM

## 2016-11-07 NOTE — Telephone Encounter (Signed)
Pt called back and said she would like to try to wean off on estradiol patch weekly and progesterone 1 po at bedtime. Patient would like directions how to do this. Please advise

## 2016-11-07 NOTE — Telephone Encounter (Signed)
Weaning of patch:  Apply 2/3 of patch as long as it takes to get used to that lower level, then go to 1/2 of patch as long as needed, then to 1/3... Then stop.  Continue Progesterone 1 tab at bedtime until not using any patch at all.

## 2016-11-07 NOTE — Telephone Encounter (Signed)
Pt left message in triage voicemail asking for me to call her. I called and left message for pt to call me back.

## 2016-11-07 NOTE — Telephone Encounter (Signed)
Pt informed

## 2016-11-14 ENCOUNTER — Ambulatory Visit (INDEPENDENT_AMBULATORY_CARE_PROVIDER_SITE_OTHER): Payer: Medicare Other | Admitting: Family Medicine

## 2016-11-14 ENCOUNTER — Encounter: Payer: Self-pay | Admitting: Family Medicine

## 2016-11-14 DIAGNOSIS — M5136 Other intervertebral disc degeneration, lumbar region: Secondary | ICD-10-CM

## 2016-11-14 DIAGNOSIS — M999 Biomechanical lesion, unspecified: Secondary | ICD-10-CM | POA: Diagnosis not present

## 2016-11-14 NOTE — Assessment & Plan Note (Signed)
Known degenerative disease disease. X-rays have shown the arthritis. Possible spinal stenosis based on patient's symptoms still with radicular symptoms. Patient is on gabapentin and will not increase at this time. Continue the once weekly vitamin D. Encourage her to do the exercises on a more regular basis and we'll consider physical therapy [place]. Attempted osteopathic manipulation hopefully this will be beneficial. Discussed icing regimen, home exercises, which activities to do in which ones to avoid. Patient will follow-up with me again in 3-4 weeks

## 2016-11-14 NOTE — Patient Instructions (Signed)
Good to see you  pennsaid pinkie amount topically 2 times daily as needed.  Duexis up to 2 times a day if needed Keep up with the exercises 2-3 times a week.  Have safe travels See me again in 4 weeks.

## 2016-11-14 NOTE — Assessment & Plan Note (Signed)
Decision today to treat with OMT was based on Physical Exam  After verbal consent patient was treated with HVLA, ME, FPR techniques in cervical, thoracic, lumbar and sacral areas  Patient tolerated the procedure well with improvement in symptoms  Patient given exercises, stretches and lifestyle modifications  See medications in patient instructions if given  Patient will follow up in 3-4 weeks  

## 2016-11-14 NOTE — Progress Notes (Signed)
[name] [address], [place] [address] Phone: [phone] Subjective:    CC:  Low back pain follow-up  YKD:[id number]  Kelsey Church is a 66 y.o. female coming in with complaint of low back pain. Patient was found to have some lumbar radiculopathy. Concern for more of a spinal stenosis. Asian did have x-rays that were independently visualized by me showing significant amount of degenerative disc disease and facet arthritis but no compression fracture.  Patient was given exercises as well as started on once weekly vitamin D and gabapentin. Patient given a muscle relaxer for back pain. Patient states that when she finally became compliant with the exercises her back pain got better. Says she works out Wednesday's and Thursday's. Says the muscle relaxer and heat helped.      Past Medical History:  Diagnosis Date  . Arthritis    PAIN AND OA LEFT KNEE; S/P RIGHT TOTAL KNEE ARTHROPLASTY 06/10/13  . Cancer ([facility])    melanoma in 2 sights  . Complication of anesthesia    "chills every evening at sundown x 2 weeks"- no fever  . Depression   . Dysphagia   . GERD (gastroesophageal reflux disease)   . History of shingles    NO RESIDUAL PROBLEMS  . Hypertension   . Melanoma in situ ([place])   . Pain    LOWER BACK PAIN  . Sleep apnea 09/2011   CPAP  . Sleep apnea    Past Surgical History:  Procedure Laterality Date  . APPENDECTOMY  1958  . COLONOSCOPY    . COLONOSCOPY W/ POLYPECTOMY     7 polyps  . FOOT SURGERY Right    removal plantar wart  . GALLBLADDER SURGERY  02/14/1990  . INJECTION KNEE  09/2011   right   . KNEE ARTHROSCOPY  1996  . MELANOMA EXCISION  1992   right chin, back  . POLYPECTOMY    . SALPINGOOPHORECTOMY  2009   Right  . TOTAL KNEE ARTHROPLASTY Right 06/10/2013   Procedure: RIGHT TOTAL KNEE ARTHROPLASTY;  Surgeon: [name], MD;  Location: [facility];  Service: Orthopedics;  Laterality: Right;  . TOTAL KNEE ARTHROPLASTY  Left 12/16/2013   Procedure: LEFT TOTAL KNEE ARTHROPLASTY;  Surgeon: [name], MD;  Location: [facility];  Service: Orthopedics;  Laterality: Left;   Social History   Social History  . Marital status: Widowed    Spouse name: N/A  . Number of children: 1  . Years of education: N/A   Occupational History  . [employer]   Social History Main Topics  . Smoking status: Never Smoker  . Smokeless tobacco: Never Used  . Alcohol use No  . Drug use: No  . Sexual activity: Not Currently   Other Topics Concern  . None   Social History Narrative   Caffienated drinks-no   Seat belt use often-yes   Regular Exercise-yes   Smoke alarm in the home-yes   Firearms/guns in the home-no   History of physical abuse-no               No Known Allergies Family History  Problem Relation Age of Onset  . Heart disease Father   . Osteoporosis Mother   . Cancer Maternal Grandmother        Breast and Uterine Cancer  . Hypertension Sister   . Diabetes Sister   . Colon polyps Sister   . Colon cancer Neg Hx   . Rectal  cancer Neg Hx   . Stomach cancer Neg Hx   . Esophageal cancer Neg Hx      Past medical history, social, surgical and family history all reviewed in electronic medical record.  No pertanent information unless stated regarding to the chief complaint.   Review of Systems:Review of systems updated and as accurate as of 11/14/16  No headache, visual changes, nausea, vomiting, diarrhea, constipation, dizziness, abdominal pain, skin rash, fevers, chills, night sweats, weight loss, swollen lymph nodes, body aches, joint swelling, chest pain, shortness of breath, mood changes. Positive muscle aches  Objective  Blood pressure 110/80, pulse 75, height 5\' 6"  (1.676 m), weight 196 lb (88.9 kg), SpO2 98 %. Systems examined below as of 11/14/16   General: No apparent distress alert and oriented x3 mood and affect normal, dressed appropriately.  HEENT: Pupils equal, extraocular  movements intact  Respiratory: Patient's speak in full sentences and does not appear short of breath  Cardiovascular: No lower extremity edema, non tender, no erythema  Skin: Warm dry intact with no signs of infection or rash on extremities or on axial skeleton.  Abdomen: Soft nontender  Neuro: Cranial nerves II through XII are intact, neurovascularly intact in all extremities with 2+ DTRs and 2+ pulses.  Lymph: No lymphadenopathy of posterior or anterior cervical chain or axillae bilaterally.  Gait normal with good balance and coordination.  MSK:  Non tender with full range of motion and good stability and symmetric strength and tone of shoulders, elbows, wrist, hip, knee and ankles bilaterally.  Back Exam:  Inspection: Mild loss of lordosis Motion: Flexion 45 deg, Extension 25 deg, Side Bending to 45 deg bilaterally,  Rotation to 45 deg bilaterally  SLR laying: Negative  XSLR laying: Negative  Palpable tenderness: Tender to palpation more in the thoracolumbar and sacroiliac joints. FABER: Tightness bilaterally. Sensory change: Gross sensation intact to all lumbar and sacral dermatomes.  Reflexes: 2+ at both patellar tendons, 2+ at achilles tendons, Babinski's downgoing.  Strength at foot  Plantar-flexion: 5/5 Dorsi-flexion: 5/5 Eversion: 5/5 Inversion: 5/5  Leg strength  Quad: 5/5 Hamstring: 5/5 Hip flexor: 5/5 Hip abductors: 5/5  Gait unremarkable.  Osteopathic findings C2 flexed rotated and side bent right C4 flexed rotated and side bent left C6 flexed rotated and side bent left T3 extended rotated and side bent right inhaled third rib T9 extended rotated and side bent left L2 flexed rotated and side bent right Sacrum right on right    Impression and Recommendations:     This case required medical decision making of moderate complexity.      Note: This dictation was prepared with Dragon dictation along with smaller phrase technology. Any transcriptional errors that  result from this process are unintentional.

## 2016-12-12 ENCOUNTER — Ambulatory Visit: Payer: Medicare Other | Admitting: Family Medicine

## 2016-12-19 ENCOUNTER — Ambulatory Visit: Payer: Medicare Other | Admitting: Family Medicine

## 2016-12-22 ENCOUNTER — Other Ambulatory Visit: Payer: Self-pay | Admitting: Internal Medicine

## 2017-01-14 ENCOUNTER — Other Ambulatory Visit: Payer: Self-pay | Admitting: Family Medicine

## 2017-01-16 ENCOUNTER — Ambulatory Visit
Admission: RE | Admit: 2017-01-16 | Discharge: 2017-01-16 | Disposition: A | Discharge status: Home / Self Care | Payer: Medicare Other | Source: Ambulatory Visit | Attending: Obstetrics & Gynecology | Admitting: Obstetrics & Gynecology

## 2017-01-16 DIAGNOSIS — Z1231 Encounter for screening mammogram for malignant neoplasm of breast: Secondary | ICD-10-CM

## 2017-01-20 ENCOUNTER — Ambulatory Visit (INDEPENDENT_AMBULATORY_CARE_PROVIDER_SITE_OTHER): Payer: Medicare Other | Admitting: Obstetrics & Gynecology

## 2017-01-20 ENCOUNTER — Encounter: Payer: Self-pay | Admitting: Obstetrics & Gynecology

## 2017-01-20 VITALS — BP 128/84 | Ht 65.0 in | Wt 194.0 lb

## 2017-01-20 DIAGNOSIS — Z01419 Encounter for gynecological examination (general) (routine) without abnormal findings: Secondary | ICD-10-CM

## 2017-01-20 DIAGNOSIS — Z6832 Body mass index (BMI) 32.0-32.9, adult: Secondary | ICD-10-CM

## 2017-01-20 DIAGNOSIS — E6609 Other obesity due to excess calories: Secondary | ICD-10-CM | POA: Diagnosis not present

## 2017-01-20 DIAGNOSIS — Z124 Encounter for screening for malignant neoplasm of cervix: Secondary | ICD-10-CM

## 2017-01-20 DIAGNOSIS — Z79890 Hormone replacement therapy (postmenopausal): Secondary | ICD-10-CM | POA: Diagnosis not present

## 2017-01-20 DIAGNOSIS — N393 Stress incontinence (female) (male): Secondary | ICD-10-CM

## 2017-01-20 LAB — HM PAP SMEAR

## 2017-01-20 MED ORDER — PROGESTERONE MICRONIZED 100 MG PO CAPS: 100.0000 mg | ORAL_CAPSULE | Freq: Every day | ORAL | 4 refills | Status: DC

## 2017-01-20 MED ORDER — ESTRADIOL 0.025 MG/24HR TD PTWK: 0.0250 mg | MEDICATED_PATCH | TRANSDERMAL | 4 refills | Status: DC

## 2017-01-20 NOTE — Addendum Note (Signed)
Addended by: Thurnell Garbe A on: 01/20/2017 10:54 AM   Modules accepted: Orders

## 2017-01-20 NOTE — Patient Instructions (Signed)
1. Encounter for routine gynecological examination with Papanicolaou smear of cervix Normal gynecologic exam in menopause.  Pap reflex done.  Breast exam normal.  Will repeat screening mammogram in January 2019.  Colonoscopy normal 2016.  Health labs with family physician.  2. Post-menopause on HRT (hormone replacement therapy) Well on low dosage hormone replacement therapy.  In the process of weaning.  Declines completely stopping hormone replacement therapy at this time.  Risk benefits discussed.  Estradiol patch 0.025 prescribed with Prometrium 100 mg at bedtime.  Patient will eventually cut the patch in half to continue weaning.  Last bone density 2016 completely normal.  Will repeat bone density next year.  Continue with vitamin D and calcium supplements with regular weightbearing physical activity.  3. Class 1 obesity due to excess calories without serious comorbidity with body mass index (BMI) of 32.0 to 32.9 in adult Low calorie, low glucose diet discussed.  [employer] recommended.  Continue with aerobic and weightlifting physical activities.  4. SUI (stress urinary incontinence, female) Recommend doing Kegel exercises more regularly.  Instructions added to the summary.  [name], it was a pleasure seeing you today!  I will inform you of your results as soon as available.   Kegel Exercises Kegel exercises help strengthen the muscles that support the rectum, vagina, small intestine, bladder, and uterus. Doing Kegel exercises can help:  Improve bladder and bowel control.  Improve sexual response.  Reduce problems and discomfort during pregnancy.  Kegel exercises involve squeezing your pelvic floor muscles, which are the same muscles you squeeze when you try to stop the flow of urine. The exercises can be done while sitting, standing, or lying down, but it is best to vary your position. Phase 1 exercises 1. Squeeze your pelvic floor muscles tight. You should feel a tight lift in your  rectal area. If you are a female, you should also feel a tightness in your vaginal area. Keep your stomach, buttocks, and legs relaxed. 2. Hold the muscles tight for up to 10 seconds. 3. Relax your muscles. Repeat this exercise 50 times a day or as many times as told by your health care provider. Continue to do this exercise for at least 4-6 weeks or for as long as told by your health care provider. This information is not intended to replace advice given to you by your health care provider. Make sure you discuss any questions you have with your health care provider. Document Released: 01/18/2012 Document Revised: 09/26/2015 Document Reviewed: 12/21/2014 Elsevier Interactive Patient Education  2018 [employer] Maintenance for Postmenopausal Women Menopause is a normal process in which your reproductive ability comes to an end. This process happens gradually over a span of months to years, usually between the ages of 75 and 33. Menopause is complete when you have missed 12 consecutive menstrual periods. It is important to talk with your health care provider about some of the most common conditions that affect postmenopausal women, such as heart disease, cancer, and bone loss (osteoporosis). Adopting a healthy lifestyle and getting preventive care can help to promote your health and wellness. Those actions can also lower your chances of developing some of these common conditions. What should I know about menopause? During menopause, you may experience a number of symptoms, such as:  Moderate-to-severe hot flashes.  Night sweats.  Decrease in sex drive.  Mood swings.  Headaches.  Tiredness.  Irritability.  Memory problems.  Insomnia.  Choosing to treat or not to treat menopausal changes  is an individual decision that you make with your health care provider. What should I know about hormone replacement therapy and supplements? Hormone therapy products are effective for  treating symptoms that are associated with menopause, such as hot flashes and night sweats. Hormone replacement carries certain risks, especially as you become older. If you are thinking about using estrogen or estrogen with progestin treatments, discuss the benefits and risks with your health care provider. What should I know about heart disease and stroke? Heart disease, heart attack, and stroke become more likely as you age. This may be due, in part, to the hormonal changes that your body experiences during menopause. These can affect how your body processes dietary fats, triglycerides, and cholesterol. Heart attack and stroke are both medical emergencies. There are many things that you can do to help prevent heart disease and stroke:  Have your blood pressure checked at least every 1-2 years. High blood pressure causes heart disease and increases the risk of stroke.  If you are 73-58 years old, ask your health care provider if you should take aspirin to prevent a heart attack or a stroke.  Do not use any tobacco products, including cigarettes, chewing tobacco, or electronic cigarettes. If you need help quitting, ask your health care provider.  It is important to eat a healthy diet and maintain a healthy weight. ? Be sure to include plenty of vegetables, fruits, low-fat dairy products, and lean protein. ? Avoid eating foods that are high in solid fats, added sugars, or salt (sodium).  Get regular exercise. This is one of the most important things that you can do for your health. ? Try to exercise for at least 150 minutes each week. The type of exercise that you do should increase your heart rate and make you sweat. This is known as moderate-intensity exercise. ? Try to do strengthening exercises at least twice each week. Do these in addition to the moderate-intensity exercise.  Know your numbers.Ask your health care provider to check your cholesterol and your blood glucose. Continue to have  your blood tested as directed by your health care provider.  What should I know about cancer screening? There are several types of cancer. Take the following steps to reduce your risk and to catch any cancer development as early as possible. Breast Cancer  Practice breast self-awareness. ? This means understanding how your breasts normally appear and feel. ? It also means doing regular breast self-exams. Let your health care provider know about any changes, no matter how small.  If you are 9 or older, have a clinician do a breast exam (clinical breast exam or CBE) every year. Depending on your age, family history, and medical history, it may be recommended that you also have a yearly breast X-ray (mammogram).  If you have a family history of breast cancer, talk with your health care provider about genetic screening.  If you are at high risk for breast cancer, talk with your health care provider about having an MRI and a mammogram every year.  Breast cancer (BRCA) gene test is recommended for women who have family members with BRCA-related cancers. Results of the assessment will determine the need for genetic counseling and BRCA1 and for BRCA2 testing. BRCA-related cancers include these types: ? Breast. This occurs in males or females. ? Ovarian. ? Tubal. This may also be called fallopian tube cancer. ? Cancer of the abdominal or pelvic lining (peritoneal cancer). ? Prostate. ? Pancreatic.  Cervical, Uterine, and Ovarian Cancer  Your health care provider may recommend that you be screened regularly for cancer of the pelvic organs. These include your ovaries, uterus, and vagina. This screening involves a pelvic exam, which includes checking for microscopic changes to the surface of your cervix (Pap test).  For women ages 21-65, health care providers may recommend a pelvic exam and a Pap test every three years. For women ages 34-65, they may recommend the Pap test and pelvic exam, combined  with testing for human papilloma virus (HPV), every five years. Some types of HPV increase your risk of cervical cancer. Testing for HPV may also be done on women of any age who have unclear Pap test results.  Other health care providers may not recommend any screening for nonpregnant women who are considered low risk for pelvic cancer and have no symptoms. Ask your health care provider if a screening pelvic exam is right for you.  If you have had past treatment for cervical cancer or a condition that could lead to cancer, you need Pap tests and screening for cancer for at least 20 years after your treatment. If Pap tests have been discontinued for you, your risk factors (such as having a new sexual partner) need to be reassessed to determine if you should start having screenings again. Some women have medical problems that increase the chance of getting cervical cancer. In these cases, your health care provider may recommend that you have screening and Pap tests more often.  If you have a family history of uterine cancer or ovarian cancer, talk with your health care provider about genetic screening.  If you have vaginal bleeding after reaching menopause, tell your health care provider.  There are currently no reliable tests available to screen for ovarian cancer.  Lung Cancer Lung cancer screening is recommended for adults 41-67 years old who are at high risk for lung cancer because of a history of smoking. A yearly low-dose CT scan of the lungs is recommended if you:  Currently smoke.  Have a history of at least 30 pack-years of smoking and you currently smoke or have quit within the past 15 years. A pack-year is smoking an average of one pack of cigarettes per day for one year.  Yearly screening should:  Continue until it has been 15 years since you quit.  Stop if you develop a health problem that would prevent you from having lung cancer treatment.  Colorectal Cancer  This type of  cancer can be detected and can often be prevented.  Routine colorectal cancer screening usually begins at age 32 and continues through age 83.  If you have risk factors for colon cancer, your health care provider may recommend that you be screened at an earlier age.  If you have a family history of colorectal cancer, talk with your health care provider about genetic screening.  Your health care provider may also recommend using home test kits to check for hidden blood in your stool.  A small camera at the end of a tube can be used to examine your colon directly (sigmoidoscopy or colonoscopy). This is done to check for the earliest forms of colorectal cancer.  Direct examination of the colon should be repeated every 5-10 years until age 73. However, if early forms of precancerous polyps or small growths are found or if you have a family history or genetic risk for colorectal cancer, you may need to be screened more often.  Skin Cancer  Check your skin from head to toe  regularly.  Monitor any moles. Be sure to tell your health care provider: ? About any new moles or changes in moles, especially if there is a change in a mole's shape or color. ? If you have a mole that is larger than the size of a pencil eraser.  If any of your family members has a history of skin cancer, especially at a young age, talk with your health care provider about genetic screening.  Always use sunscreen. Apply sunscreen liberally and repeatedly throughout the day.  Whenever you are outside, protect yourself by wearing long sleeves, pants, a wide-brimmed hat, and sunglasses.  What should I know about osteoporosis? Osteoporosis is a condition in which bone destruction happens more quickly than new bone creation. After menopause, you may be at an increased risk for osteoporosis. To help prevent osteoporosis or the bone fractures that can happen because of osteoporosis, the following is recommended:  If you are  70-55 years old, get at least 1,000 mg of calcium and at least 600 mg of vitamin D per day.  If you are older than age 32 but younger than age 66, get at least 1,200 mg of calcium and at least 600 mg of vitamin D per day.  If you are older than age 98, get at least 1,200 mg of calcium and at least 800 mg of vitamin D per day.  Smoking and excessive alcohol intake increase the risk of osteoporosis. Eat foods that are rich in calcium and vitamin D, and do weight-bearing exercises several times each week as directed by your health care provider. What should I know about how menopause affects my mental health? Depression may occur at any age, but it is more common as you become older. Common symptoms of depression include:  Low or sad mood.  Changes in sleep patterns.  Changes in appetite or eating patterns.  Feeling an overall lack of motivation or enjoyment of activities that you previously enjoyed.  Frequent crying spells.  Talk with your health care provider if you think that you are experiencing depression. What should I know about immunizations? It is important that you get and maintain your immunizations. These include:  Tetanus, diphtheria, and pertussis (Tdap) booster vaccine.  Influenza every year before the flu season begins.  Pneumonia vaccine.  Shingles vaccine.  Your health care provider may also recommend other immunizations. This information is not intended to replace advice given to you by your health care provider. Make sure you discuss any questions you have with your health care provider. Document Released: 03/25/2005 Document Revised: 08/21/2015 Document Reviewed: 11/04/2014 Elsevier Interactive Patient Education  2018 [employer].

## 2017-01-20 NOTE — Progress Notes (Signed)
Kelsey Church [birth year] [id number]   History:    66 y.o. G2P1A1L1 widowed.  RP:  Established patient presenting for annual gyn exam   HPI:  Well on Estradiol 1/2 of 0.05 mg patch and Prometrium 100 mg HS.  No PMB.  No pelvic pain.  Normal vaginal secretions.  Abstinent.  Breasts normal.  Urine mild stress urinary incontinence with sneezing and coughing.  Not doing Kegle exercises regularly.  Bowel movements normal.  Body mass index 32.28.  Concerned about her progressive weight gain.  Exercises regularly.  Past medical history,surgical history, family history and social history were all reviewed and documented in the EPIC chart.  Gynecologic History No LMP recorded. Patient is postmenopausal. Contraception: post menopausal status Last Pap: 11/2014. Results were: Negative/HPV HR neg Last mammogram: 02/2016. Results were: normal [place] 01/2015 normal Bone Density 12/2014 normal  Obstetric History OB History  Gravida Para Term Preterm AB Living  2 1   1   1   SAB TAB Ectopic Multiple Live Births               # Outcome Date GA Lbr Len/2nd Weight Sex Delivery Anes PTL Lv  2 Gravida           1 Preterm                ROS: A ROS was performed and pertinent positives and negatives are included in the history.  GENERAL: No fevers or chills. HEENT: No change in vision, no earache, sore throat or sinus congestion. NECK: No pain or stiffness. CARDIOVASCULAR: No chest pain or pressure. No palpitations. PULMONARY: No shortness of breath, cough or wheeze. GASTROINTESTINAL: No abdominal pain, nausea, vomiting or diarrhea, melena or bright red blood per rectum. GENITOURINARY: No urinary frequency, urgency, hesitancy or dysuria. MUSCULOSKELETAL: No joint or muscle pain, no back pain, no recent trauma. DERMATOLOGIC: No rash, no itching, no lesions. ENDOCRINE: No polyuria, polydipsia, no heat or cold intolerance. No recent change in weight. HEMATOLOGICAL: No anemia or easy bruising or bleeding.  NEUROLOGIC: No headache, seizures, numbness, tingling or weakness. PSYCHIATRIC: No depression, no loss of interest in normal activity or change in sleep pattern.     Exam:   BP 128/84   Ht 5\' 5"  (1.651 m)   Wt 194 lb (88 kg)   BMI 32.28 kg/m   Body mass index is 32.28 kg/m.  General appearance : Well developed well nourished female. No acute distress HEENT: Eyes: no retinal hemorrhage or exudates,  Neck supple, trachea midline, no carotid bruits, no thyroidmegaly Lungs: Clear to auscultation, no rhonchi or wheezes, or rib retractions  Heart: Regular rate and rhythm, no murmurs or gallops Breast:Examined in sitting and supine position were symmetrical in appearance, no palpable masses or tenderness,  no skin retraction, no nipple inversion, no nipple discharge, no skin discoloration, no axillary or supraclavicular lymphadenopathy Abdomen: no palpable masses or tenderness, no rebound or guarding Extremities: no edema or skin discoloration or tenderness  Pelvic: Vulva normal  Bartholin, Urethra, Skene Glands: Within normal limits             Vagina: No gross lesions or discharge  Cervix: No gross lesions or discharge.  Pap reflex done.  Uterus  AV, normal size, shape and consistency, non-tender and mobile  Adnexa  Without masses or tenderness  Anus and perineum  normal   Assessment/Plan:  66 y.o. female for annual exam   1. Encounter for routine gynecological examination with Papanicolaou smear of cervix  Normal gynecologic exam in menopause.  Pap reflex done.  Breast exam normal.  Will repeat screening mammogram in January 2019.  Colonoscopy normal 2016.  Health labs with family physician.  2. Post-menopause on HRT (hormone replacement therapy) Well on low dosage hormone replacement therapy.  In the process of weaning.  Declines completely stopping hormone replacement therapy at this time.  Risk benefits discussed.  Estradiol patch 0.025 prescribed with Prometrium 100 mg at  bedtime.  Patient will eventually cut the patch in half to continue weaning.  Last bone density 2016 completely normal.  Will repeat bone density next year.  Continue with vitamin D and calcium supplements with regular weightbearing physical activity.  3. Class 1 obesity due to excess calories without serious comorbidity with body mass index (BMI) of 32.0 to 32.9 in adult Low calorie, low glucose diet discussed.  [employer] recommended.  Continue with aerobic and weightlifting physical activities.  4. SUI (stress urinary incontinence, female) Recommend doing Kegel exercises more regularly.  Instructions added to the summary.  Counseling on above issues more than 50% for 15 minutes.  Princess Bruins MD, 10:11 AM 01/20/2017

## 2017-01-24 LAB — PAP IG W/ RFLX HPV ASCU

## 2017-02-02 ENCOUNTER — Encounter: Payer: Self-pay | Admitting: Family Medicine

## 2017-02-02 ENCOUNTER — Ambulatory Visit: Payer: Self-pay

## 2017-02-02 ENCOUNTER — Ambulatory Visit (INDEPENDENT_AMBULATORY_CARE_PROVIDER_SITE_OTHER): Payer: Medicare Other | Admitting: Family Medicine

## 2017-02-02 VITALS — BP 110/68 | HR 77 | Ht 66.0 in | Wt 197.0 lb

## 2017-02-02 DIAGNOSIS — M7551 Bursitis of right shoulder: Secondary | ICD-10-CM

## 2017-02-02 DIAGNOSIS — M25511 Pain in right shoulder: Principal | ICD-10-CM

## 2017-02-02 DIAGNOSIS — G8929 Other chronic pain: Secondary | ICD-10-CM | POA: Diagnosis not present

## 2017-02-02 NOTE — Assessment & Plan Note (Signed)
Repeat injection given again today.  Last one was greater than 3 years ago.  We discussed icing regimen and home exercises.  We discussed which activities to do which wants to avoid.  Topical anti-inflammatories and oral anti-inflammatories given.  Follow-up again in 4 weeks for further evaluation and treatment

## 2017-02-02 NOTE — Patient Instructions (Signed)
Good to see you  Ice 20 minutes 2 times daily. Usually after activity and before bed. Keep hands within peripheral vision.  pennsaid pinkie amount topically 2 times daily as needed.  Exercises 3 times a week.  After christmas See me again in 3-4 weeks if not all the way gone.  Happy New Year!

## 2017-02-02 NOTE — Progress Notes (Signed)
[name] [address] [place], [place] [address] Phone: [phone] Subjective:     CC: right Shoulder pain  [id number]  Kelsey Church is a 66 y.o. female coming in with complaint of right shoulder pain. She said the pain started in her ear. She recalls one day of numbness and tingling in her fingers. The pain keeps her up at night. She says it looks and feels like its her shoulder is "pertruding". She feels upper trap. Tightness as well.   Onset- 01/25/2017 Location- anterior/lateral Duration-  Character- Sharp, throbbing, dull Aggravating factors- Picking things up Reliving factors- Ice, duexis, alleve Therapies tried-  Severity-5 out of 10 and severe     Past Medical History:  Diagnosis Date  . Arthritis    PAIN AND OA LEFT KNEE; S/P RIGHT TOTAL KNEE ARTHROPLASTY 06/10/13  . Cancer ([place])    melanoma in 2 sights  . Complication of anesthesia    "chills every evening at sundown x 2 weeks"- no fever  . Depression   . Dysphagia   . GERD (gastroesophageal reflux disease)   . History of shingles    NO RESIDUAL PROBLEMS  . Hypertension   . Melanoma in situ ([place])   . Pain    LOWER BACK PAIN  . Sleep apnea 09/2011   CPAP  . Sleep apnea    Past Surgical History:  Procedure Laterality Date  . APPENDECTOMY  1958  . COLONOSCOPY    . COLONOSCOPY W/ POLYPECTOMY     7 polyps  . FOOT SURGERY Right    removal plantar wart  . GALLBLADDER SURGERY  02/14/1990  . INJECTION KNEE  09/2011   right   . KNEE ARTHROSCOPY  1996  . MELANOMA EXCISION  1992   right chin, back  . POLYPECTOMY    . SALPINGOOPHORECTOMY  2009   Right  . TOTAL KNEE ARTHROPLASTY Right 06/10/2013   Procedure: RIGHT TOTAL KNEE ARTHROPLASTY;  Surgeon: [name], MD;  Location: [facility];  Service: Orthopedics;  Laterality: Right;  . TOTAL KNEE ARTHROPLASTY Left 12/16/2013   Procedure: LEFT TOTAL KNEE ARTHROPLASTY;  Surgeon: [name], MD;  Location: [facility];   Service: Orthopedics;  Laterality: Left;   Social History   Socioeconomic History  . Marital status: Widowed    Spouse name: None  . Number of children: 1  . Years of education: None  . Highest education level: None  Social Needs  . Financial resource strain: None  . Food insecurity - worry: None  . Food insecurity - inability: None  . Transportation needs - medical: None  . Transportation needs - non-medical: None  Occupational History  . Occupation: CMA    Employer: [employer]  Tobacco Use  . Smoking status: Never Smoker  . Smokeless tobacco: Never Used  Substance and Sexual Activity  . Alcohol use: No    Alcohol/week: 0.0 oz  . Drug use: No  . Sexual activity: No    Comment: 1st intercourse- 18, partners- 1,   Other Topics Concern  . None  Social History Narrative   Caffienated drinks-no   Seat belt use often-yes   Regular Exercise-yes   Smoke alarm in the home-yes   Firearms/guns in the home-no   History of physical abuse-no               No Known Allergies Family History  Problem Relation Age of Onset  . Heart disease Father   . Heart attack  Father   . Hypertension Father   . Osteoporosis Mother   . Breast cancer Mother   . Cancer Maternal Grandmother        Breast and Uterine Cancer  . Breast cancer Maternal Grandmother   . Thyroid cancer Maternal Grandmother   . Hypertension Sister   . Diabetes Sister   . Colon polyps Sister   . Colon cancer Neg Hx   . Rectal cancer Neg Hx   . Stomach cancer Neg Hx   . Esophageal cancer Neg Hx      Past medical history, social, surgical and family history all reviewed in electronic medical record.  No pertanent information unless stated regarding to the chief complaint.   Review of Systems:Review of systems updated and as accurate as of 02/02/17  No headache, visual changes, nausea, vomiting, diarrhea, constipation, dizziness, abdominal pain, skin rash, fevers, chills, night sweats, weight loss, swollen lymph  nodes, body aches, joint swelling, chest pain, shortness of breath, mood changes.  Positive muscle aches  Objective  Blood pressure 110/68, pulse 77, height 5\' 6"  (1.676 m), weight 197 lb (89.4 kg), SpO2 97 %. Systems examined below as of 02/02/17   General: No apparent distress alert and oriented x3 mood and affect normal, dressed appropriately.  HEENT: Pupils equal, extraocular movements intact  Respiratory: Patient's speak in full sentences and does not appear short of breath  Cardiovascular: No lower extremity edema, non tender, no erythema  Skin: Warm dry intact with no signs of infection or rash on extremities or on axial skeleton.  Abdomen: Soft nontender  Neuro: Cranial nerves II through XII are intact, neurovascularly intact in all extremities with 2+ DTRs and 2+ pulses.  Lymph: No lymphadenopathy of posterior or anterior cervical chain or axillae bilaterally.  Gait normal with good balance and coordination.  MSK:  Non tender with full range of motion and good stability and symmetric strength and tone of  elbows, wrist, hip, knee and ankles bilaterally.  Shoulder: Right Inspection reveals no abnormalities, atrophy or asymmetry. Palpation is normal with no tenderness over AC joint or bicipital groove. ROM is full in all planes passively. Rotator cuff strength normal throughout. signs of impingement with positive Neer and Hawkin's tests, but negative empty can sign. Speeds and Yergason's tests normal. No labral pathology noted with negative Obrien's, negative clunk and good stability. Normal scapular function observed. No painful arc and no drop arm sign. No apprehension sign  MSK US performed of: Right This study was ordered, performed, and interpreted by Charlann Boxer D.O.  Shoulder:   Supraspinatus:  Appears normal on long and transverse views, Bursal bulge seen with shoulder abduction on impingement view. Infraspinatus:  Appears normal on long and transverse views.  Significant increase in Doppler flow Subscapularis:  Appears normal on long and transverse views. Positive bursa Teres Minor:  Appears normal on long and transverse views. AC joint:  Capsule undistended, no geyser sign. Glenohumeral Joint:  Appears normal without effusion. Glenoid Labrum:  Intact without visualized tears. Biceps Tendon:  Appears normal on long and transverse views, no fraying of tendon, tendon located in intertubercular groove, no subluxation with shoulder internal or external rotation.  Impression: Subacromial bursitis  Procedure: Real-time Ultrasound Guided Injection of right glenohumeral joint Device: GE Logiq E  Ultrasound guided injection is preferred based studies that show increased duration, increased effect, greater accuracy, decreased procedural pain, increased response rate with ultrasound guided versus blind injection.  Verbal informed consent obtained.  Time-out conducted.  Noted no overlying  erythema, induration, or other signs of local infection.  Skin prepped in a sterile fashion.  Local anesthesia: Topical Ethyl chloride.  With sterile technique and under real time ultrasound guidance:  Joint visualized.  23g 1  inch needle inserted posterior approach. Pictures taken for needle placement. Patient did have injection of 2 cc of 1% lidocaine, 2 cc of 0.5% Marcaine, and 1.0 cc of Kenalog 40 mg/dL. Completed without difficulty  Pain immediately resolved suggesting accurate placement of the medication.  Advised to call if fevers/chills, erythema, induration, drainage, or persistent bleeding.  Images permanently stored and available for review in the ultrasound unit.  Impression: Technically successful ultrasound guided injection.   Impression and Recommendations:     This case required medical decision making of moderate complexity.      Note: This dictation was prepared with Dragon dictation along with smaller phrase technology. Any transcriptional  errors that result from this process are unintentional.

## 2017-02-15 ENCOUNTER — Ambulatory Visit
Admission: RE | Admit: 2017-02-15 | Discharge: 2017-02-15 | Disposition: A | Discharge status: Home / Self Care | Payer: Medicare Other | Source: Ambulatory Visit | Attending: Obstetrics & Gynecology | Admitting: Obstetrics & Gynecology

## 2017-02-15 DIAGNOSIS — Z1231 Encounter for screening mammogram for malignant neoplasm of breast: Secondary | ICD-10-CM | POA: Diagnosis not present

## 2017-02-16 ENCOUNTER — Other Ambulatory Visit: Payer: Self-pay | Admitting: Obstetrics & Gynecology

## 2017-02-16 DIAGNOSIS — R928 Other abnormal and inconclusive findings on diagnostic imaging of breast: Secondary | ICD-10-CM

## 2017-02-17 ENCOUNTER — Other Ambulatory Visit: Payer: Self-pay | Admitting: Women's Health

## 2017-02-20 ENCOUNTER — Ambulatory Visit
Admission: RE | Admit: 2017-02-20 | Discharge: 2017-02-20 | Disposition: A | Discharge status: Home / Self Care | Payer: Medicare Other | Source: Ambulatory Visit | Attending: Obstetrics & Gynecology | Admitting: Obstetrics & Gynecology

## 2017-02-20 ENCOUNTER — Other Ambulatory Visit: Payer: Self-pay | Admitting: Obstetrics & Gynecology

## 2017-02-20 DIAGNOSIS — N6320 Unspecified lump in the left breast, unspecified quadrant: Secondary | ICD-10-CM

## 2017-02-20 DIAGNOSIS — R928 Other abnormal and inconclusive findings on diagnostic imaging of breast: Secondary | ICD-10-CM

## 2017-02-20 DIAGNOSIS — N6489 Other specified disorders of breast: Secondary | ICD-10-CM | POA: Diagnosis not present

## 2017-02-20 IMAGING — MG 2D DIGITAL DIAGNOSTIC UNILATERAL LEFT MAMMOGRAM WITH CAD AND ADJ
6 of 9 series · 6 of 21 positions shown · non-contrast
Comparison: Previous exam(s).

CLINICAL DATA: Screening recall for a possible left breast mass.

EXAM:
2D DIGITAL DIAGNOSTIC UNILATERAL LEFT MAMMOGRAM WITH CAD AND ADJUNCT
TOMO
LEFT BREAST ULTRASOUND

[L XCCL]
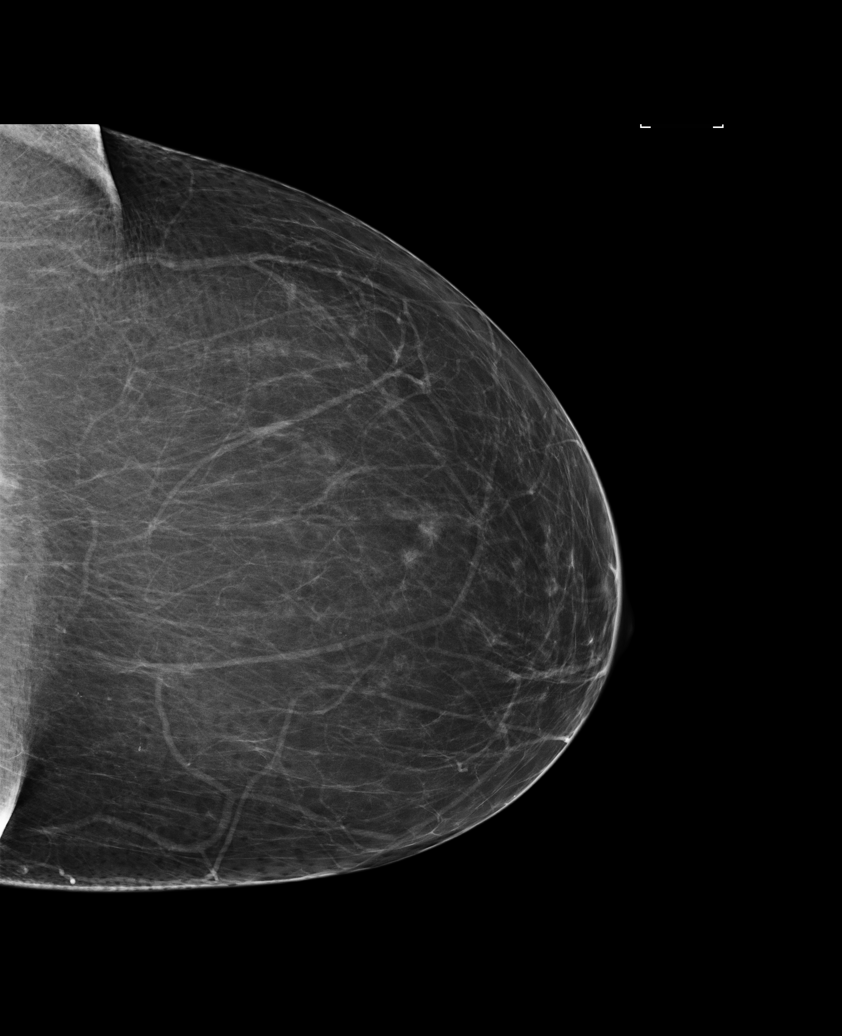

[L MLO]
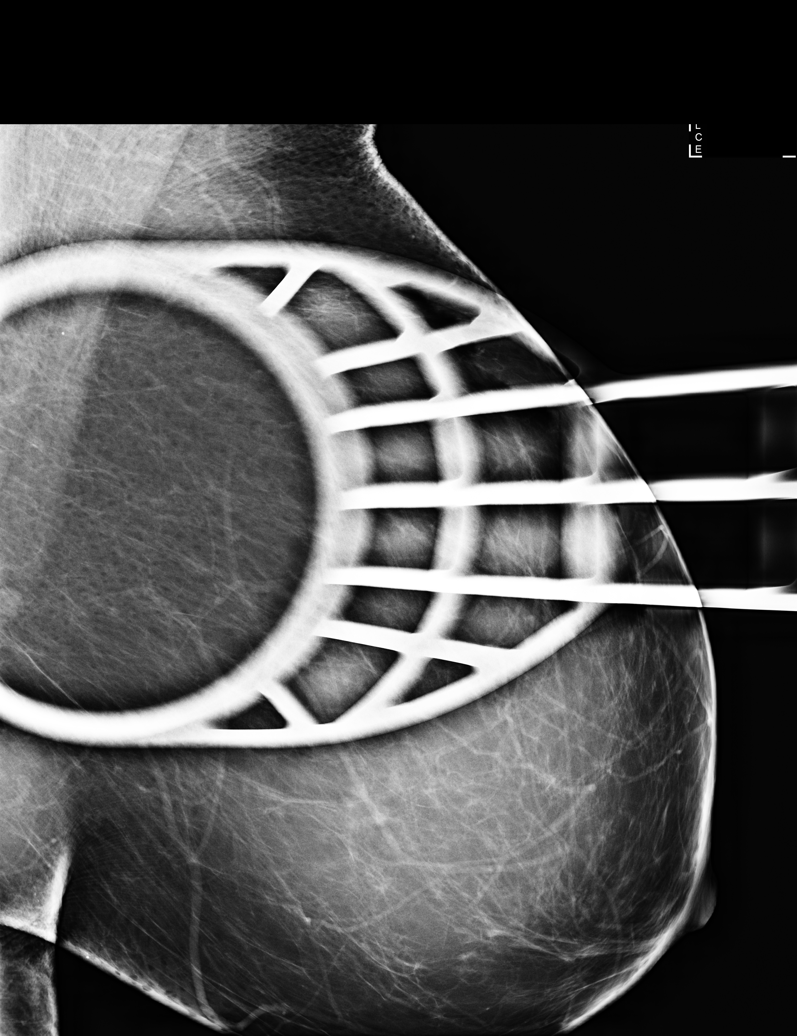

[L ML]
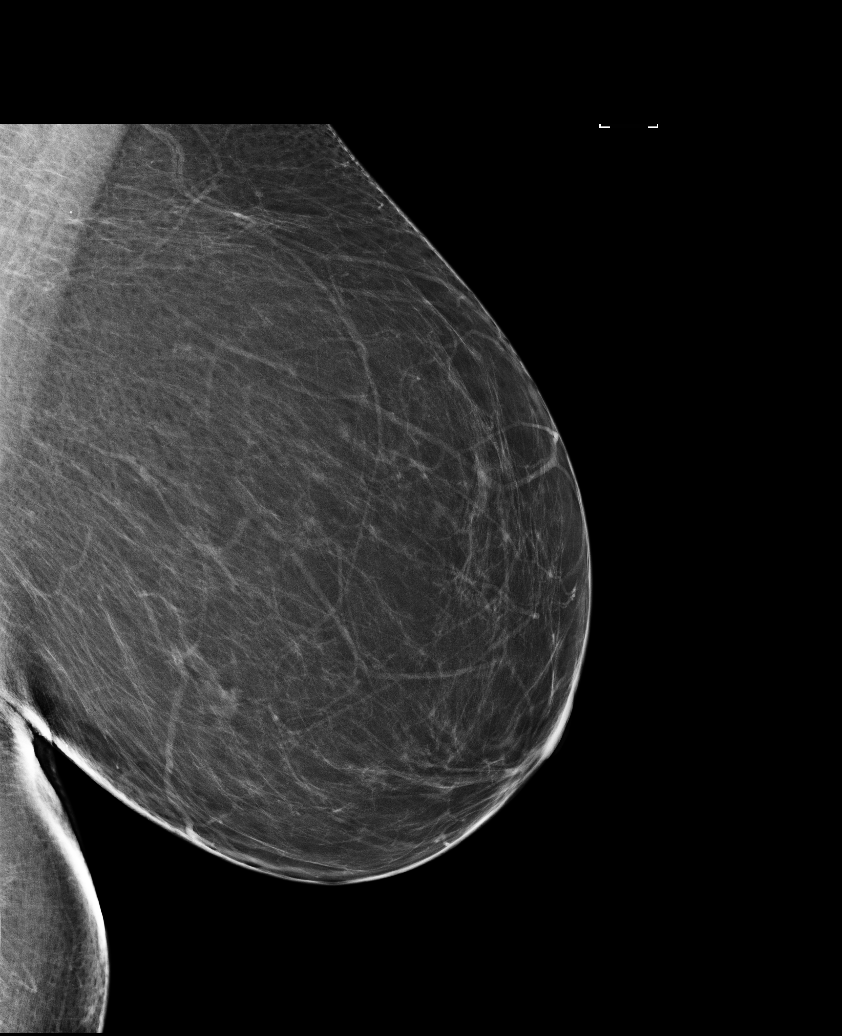

[L XCCL synth-2D]
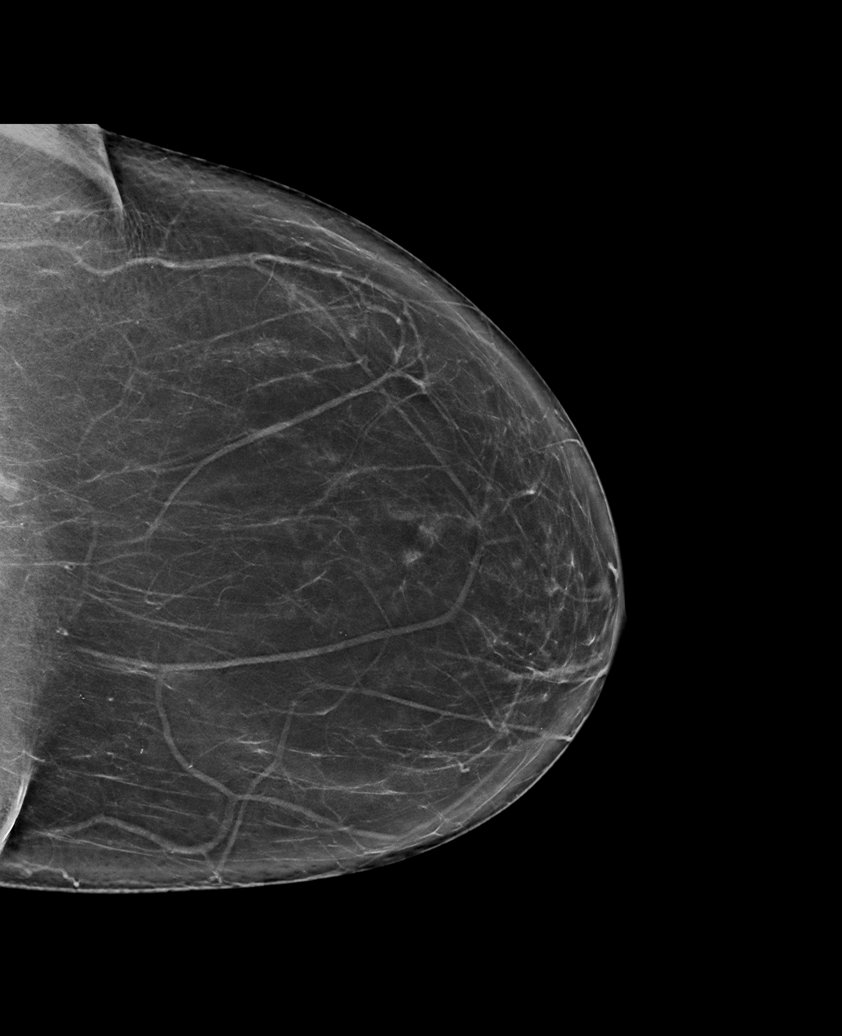

[L MLO synth-2D]
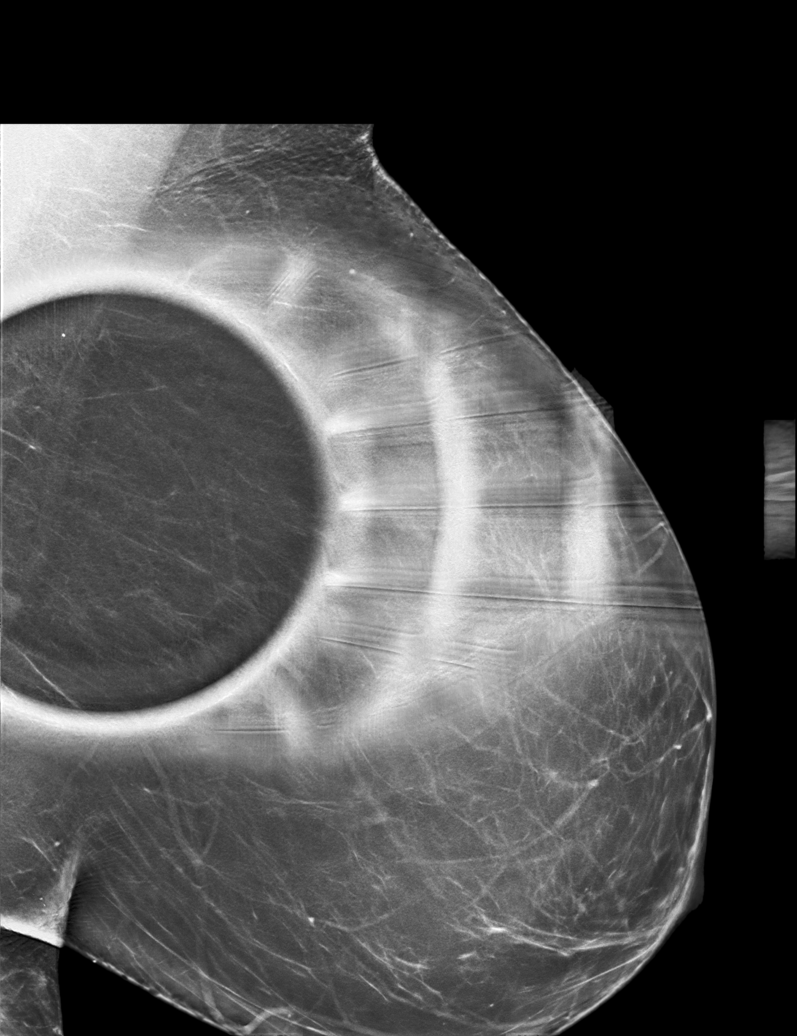

[L ML synth-2D]
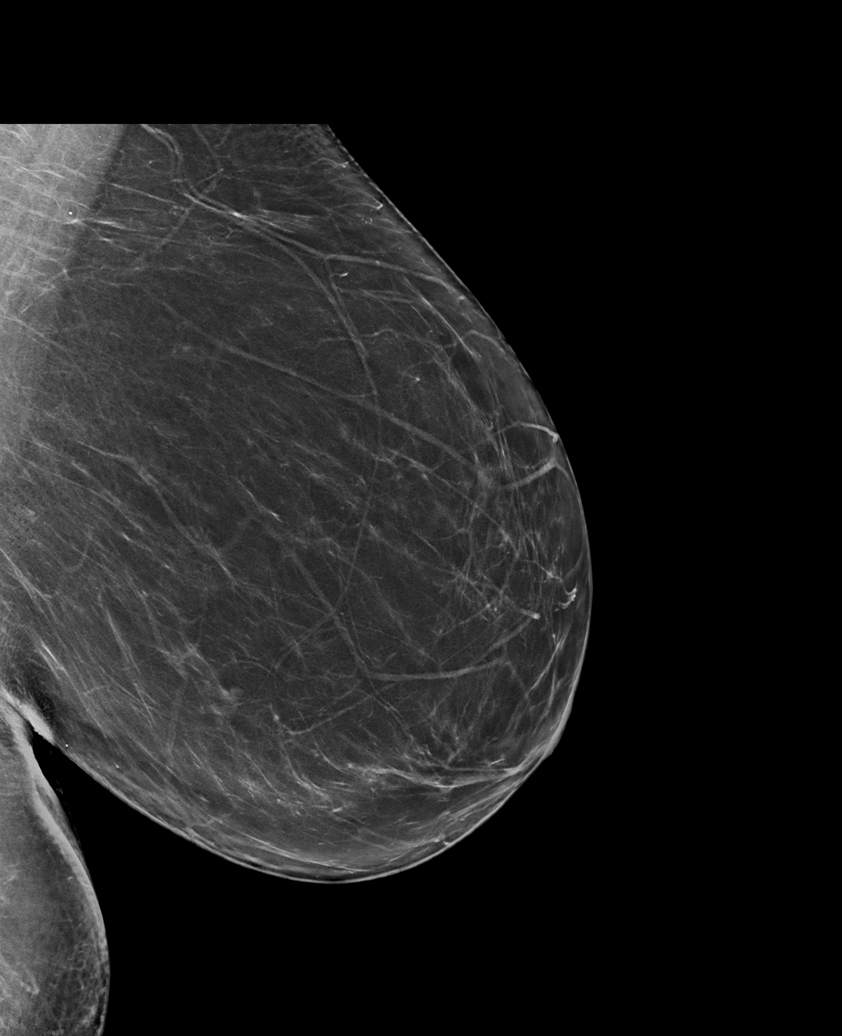

[6 of 21 positions shown; findings below may reference images not displayed]

ACR Breast Density Category b: There are scattered areas of
fibroglandular density.
FINDINGS: There is a persistent oval possible bilobed mass in the central
lateral aspect of the left breast, far posterior depth measuring 6
mm.

Mammographic images were processed with CAD.

Ultrasound of the superior and lateral aspect of the left breast
demonstrates normal fibroglandular tissue. No masses or suspicious
areas of shadowing are identified.
IMPRESSION: There is a 6 mm benign-appearing asymmetry in the central to lateral
far posterior left breast without a sonographic correlate.

RECOMMENDATION:
A six-month follow-up diagnostic left breast mammogram is
recommended.

I have discussed the findings and recommendations with the patient.
Results were also provided in writing at the conclusion of the
visit. If applicable, a reminder letter will be sent to the patient
regarding the next appointment.

BI-RADS CATEGORY  3: Probably benign.

## 2017-02-21 ENCOUNTER — Telehealth: Payer: Self-pay | Admitting: *Deleted

## 2017-02-21 NOTE — Telephone Encounter (Signed)
Medication approved until 02/21/2018, pharmacy informed as well.

## 2017-02-21 NOTE — Telephone Encounter (Signed)
Prior authorization done via cover my meds for climara patch 0.25 mg, will wait for response.

## 2017-03-16 ENCOUNTER — Ambulatory Visit (INDEPENDENT_AMBULATORY_CARE_PROVIDER_SITE_OTHER): Payer: Medicare Other | Admitting: Pulmonary Disease

## 2017-03-16 ENCOUNTER — Encounter: Payer: Self-pay | Admitting: Pulmonary Disease

## 2017-03-16 DIAGNOSIS — G4733 Obstructive sleep apnea (adult) (pediatric): Secondary | ICD-10-CM

## 2017-03-16 DIAGNOSIS — I10 Essential (primary) hypertension: Secondary | ICD-10-CM | POA: Diagnosis not present

## 2017-03-16 NOTE — Assessment & Plan Note (Signed)
Controlled.  

## 2017-03-16 NOTE — Patient Instructions (Signed)
Trial of chinstrap and F 30 fullface mask if that does not work He will qualify for a new machine, will call us by the end of the year if this machine starts to malfunction

## 2017-03-16 NOTE — Addendum Note (Signed)
Addended by: Valerie Salts on: 03/16/2017 03:00 PM   Modules accepted: Orders

## 2017-03-16 NOTE — Assessment & Plan Note (Signed)
Trial of chinstrap and F 30 fullface mask if that does not work She will qualify for a new machine, will call us by the end of the year if this machine starts to malfunction  Weight loss encouraged, compliance with goal of at least 4-6 hrs every night is the expectation. Advised against medications with sedative side effects Cautioned against driving when sleepy - understanding that sleepiness will vary on a day to day basis

## 2017-03-16 NOTE — Progress Notes (Signed)
   Subjective:    Patient ID: Kelsey Church, female    DOB: 30-Aug-1950, 67 y.o.   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  She has done well with her machine over the past 5 years, reports good improvement in her daytime somnolence and fatigue. She really has noticed a lot of mouth leak cage which wakes her up during the night.  No problems with pressure, wonders if she needs a different type of mask.  CPAP download was reviewed which shows excellent control of events on 9 cm with good compliance about 7.5 hours every night Leak is not quantified on this download     Significant tests/ events  2013 PSG  moderate OSa with AHi 22/h &lowest desatn to 87% corrected by CPAP 9 cm with swift nasal pillows  Review of Systems Patient denies significant dyspnea,cough, hemoptysis,  chest pain, palpitations, pedal edema, orthopnea, paroxysmal nocturnal dyspnea, lightheadedness, nausea, vomiting, abdominal or  leg pains      Objective:   Physical Exam  Gen. Pleasant, well-nourished, in no distress ENT - no thrush, no post nasal drip Neck: No JVD, no thyromegaly, no carotid bruits Lungs: no use of accessory muscles, no dullness to percussion, clear without rales or rhonchi  Cardiovascular: Rhythm regular, heart sounds  normal, no murmurs or gallops, no peripheral edema Musculoskeletal: No deformities, no cyanosis or clubbing        Assessment & Plan:

## 2017-03-18 ENCOUNTER — Other Ambulatory Visit: Payer: Self-pay | Admitting: Internal Medicine

## 2017-03-20 ENCOUNTER — Telehealth: Payer: Self-pay | Admitting: *Deleted

## 2017-03-20 NOTE — Telephone Encounter (Signed)
Pt was prescribed estradiol patch 0.025 mg weekly, states this patch is more expensive, previously was using estradiol 0.5 mg patch. Pt said she spoke with pharmacist and was told if you approve she can use estradiol 0.5 mg weekly patch and cut this dose in half which is much cheaper for patient. Are you okay with this? If so I will send in new Rx for estradiol 0.5 mg patch weekly. Please advise

## 2017-03-21 MED ORDER — ESTRADIOL 0.05 MG/24HR TD PTWK: 0.0500 mg | MEDICATED_PATCH | TRANSDERMAL | 4 refills | Status: DC

## 2017-03-21 NOTE — Telephone Encounter (Signed)
Agree, but correct dosage is Estradiol 0.05 patch, apply 1/2 patch weekly.

## 2017-03-21 NOTE — Telephone Encounter (Signed)
Rx sent, unable to leave message on pt voicemail, no voicemail set up

## 2017-03-25 ENCOUNTER — Other Ambulatory Visit: Payer: Self-pay | Admitting: Internal Medicine

## 2017-04-05 DIAGNOSIS — M5137 Other intervertebral disc degeneration, lumbosacral region: Secondary | ICD-10-CM | POA: Diagnosis not present

## 2017-04-05 DIAGNOSIS — M9901 Segmental and somatic dysfunction of cervical region: Secondary | ICD-10-CM | POA: Diagnosis not present

## 2017-04-05 DIAGNOSIS — M9903 Segmental and somatic dysfunction of lumbar region: Secondary | ICD-10-CM | POA: Diagnosis not present

## 2017-04-05 DIAGNOSIS — M5136 Other intervertebral disc degeneration, lumbar region: Secondary | ICD-10-CM | POA: Diagnosis not present

## 2017-04-05 DIAGNOSIS — M531 Cervicobrachial syndrome: Secondary | ICD-10-CM | POA: Diagnosis not present

## 2017-04-05 DIAGNOSIS — M9904 Segmental and somatic dysfunction of sacral region: Secondary | ICD-10-CM | POA: Diagnosis not present

## 2017-04-10 ENCOUNTER — Other Ambulatory Visit: Payer: Self-pay | Admitting: Family Medicine

## 2017-04-17 ENCOUNTER — Other Ambulatory Visit (INDEPENDENT_AMBULATORY_CARE_PROVIDER_SITE_OTHER): Payer: Medicare Other

## 2017-04-17 ENCOUNTER — Ambulatory Visit (INDEPENDENT_AMBULATORY_CARE_PROVIDER_SITE_OTHER): Payer: Medicare Other | Admitting: Internal Medicine

## 2017-04-17 ENCOUNTER — Ambulatory Visit: Payer: Medicare Other

## 2017-04-17 ENCOUNTER — Encounter: Payer: Self-pay | Admitting: Internal Medicine

## 2017-04-17 VITALS — BP 128/60 | HR 71 | Temp 97.8°F | Resp 16 | Ht 66.0 in | Wt 198.0 lb

## 2017-04-17 DIAGNOSIS — Z23 Encounter for immunization: Secondary | ICD-10-CM | POA: Diagnosis not present

## 2017-04-17 DIAGNOSIS — R0609 Other forms of dyspnea: Secondary | ICD-10-CM

## 2017-04-17 DIAGNOSIS — E7800 Pure hypercholesterolemia, unspecified: Secondary | ICD-10-CM

## 2017-04-17 DIAGNOSIS — I10 Essential (primary) hypertension: Secondary | ICD-10-CM

## 2017-04-17 DIAGNOSIS — R7303 Prediabetes: Secondary | ICD-10-CM | POA: Diagnosis not present

## 2017-04-17 DIAGNOSIS — E538 Deficiency of other specified B group vitamins: Secondary | ICD-10-CM

## 2017-04-17 DIAGNOSIS — Z1159 Encounter for screening for other viral diseases: Secondary | ICD-10-CM

## 2017-04-17 LAB — CBC WITH DIFFERENTIAL/PLATELET
BASOS PCT: 0.7 % (ref 0.0–3.0)
Basophils Absolute: 0.1 10*3/uL (ref 0.0–0.1)
EOS PCT: 2.3 % (ref 0.0–5.0)
Eosinophils Absolute: 0.2 10*3/uL (ref 0.0–0.7)
HEMATOCRIT: 39.3 % (ref 36.0–46.0)
HEMOGLOBIN: 13.1 g/dL (ref 12.0–15.0)
LYMPHS PCT: 25.4 % (ref 12.0–46.0)
Lymphs Abs: 1.9 10*3/uL (ref 0.7–4.0)
MCHC: 33.4 g/dL (ref 30.0–36.0)
MCV: 82.4 fl (ref 78.0–100.0)
MONO ABS: 0.6 10*3/uL (ref 0.1–1.0)
Monocytes Relative: 8.6 % (ref 3.0–12.0)
Neutro Abs: 4.7 10*3/uL (ref 1.4–7.7)
Neutrophils Relative %: 63 % (ref 43.0–77.0)
Platelets: 288 10*3/uL (ref 150.0–400.0)
RBC: 4.77 Mil/uL (ref 3.87–5.11)
RDW: 15.7 % — AB (ref 11.5–15.5)
WBC: 7.5 10*3/uL (ref 4.0–10.5)

## 2017-04-17 LAB — LIPID PANEL
CHOLESTEROL: 178 mg/dL (ref 0–200)
HDL: 52.4 mg/dL (ref >39.00)
LDL Cholesterol: 108 mg/dL — ABNORMAL HIGH (ref 0–99)
NONHDL: 125.32
Total CHOL/HDL Ratio: 3
Triglycerides: 88 mg/dL (ref 0.0–149.0)
VLDL: 17.6 mg/dL (ref 0.0–40.0)

## 2017-04-17 LAB — COMPREHENSIVE METABOLIC PANEL
ALBUMIN: 4 g/dL (ref 3.5–5.2)
ALK PHOS: 75 U/L (ref 39–117)
ALT: 13 U/L (ref 0–35)
AST: 14 U/L (ref 0–37)
BUN: 26 mg/dL — AB (ref 6–23)
CO2: 29 mEq/L (ref 19–32)
CREATININE: 1.24 mg/dL — AB (ref 0.40–1.20)
Calcium: 9.6 mg/dL (ref 8.4–10.5)
Chloride: 102 mEq/L (ref 96–112)
GFR: 45.9 mL/min — ABNORMAL LOW (ref >60.00)
Glucose, Bld: 85 mg/dL (ref 70–99)
POTASSIUM: 3.8 meq/L (ref 3.5–5.1)
SODIUM: 139 meq/L (ref 135–145)
TOTAL PROTEIN: 7.2 g/dL (ref 6.0–8.3)
Total Bilirubin: 0.4 mg/dL (ref 0.2–1.2)

## 2017-04-17 LAB — HEMOGLOBIN A1C: HEMOGLOBIN A1C: 6.1 % (ref 4.6–6.5)

## 2017-04-17 LAB — BRAIN NATRIURETIC PEPTIDE: PRO B NATRI PEPTIDE: 35 pg/mL (ref 0.0–100.0)

## 2017-04-17 LAB — TSH: TSH: 1.51 u[IU]/mL (ref 0.35–4.50)

## 2017-04-17 LAB — TROPONIN I: TNIDX: 0 ug/L (ref 0.00–0.06)

## 2017-04-17 MED ORDER — LOSARTAN POTASSIUM-HCTZ 100-25 MG PO TABS: 1.0000 | ORAL_TABLET | Freq: Every day | ORAL | 1 refills | Status: DC

## 2017-04-17 NOTE — Patient Instructions (Signed)

## 2017-04-17 NOTE — Progress Notes (Signed)
Subjective:  Patient ID: Kelsey Church, female    DOB: 1950-04-26  Age: 67 y.o. MRN: [id number]  CC: Hypertension   HPI Kelsey Church presents for f/up - She tells me that her blood pressure has been controlled.  She complains of puffiness on the tops of her feet at the end of the day.  She also has chronic, unchanged DOE.  She denies any episodes of chest pain, diaphoresis, dizziness, lightheadedness, or near syncope.  She also complains of weight gain.  Outpatient Medications Prior to Visit  Medication Sig Dispense Refill  . citalopram (CELEXA) 20 MG tablet TAKE 1 TABLET (20 MG TOTAL) BY MOUTH AT BEDTIME.--FILL 07-18-2015- 90 tablet 3  . cyanocobalamin 1000 MCG tablet Take 1,000 mcg by mouth daily.    Marland Kitchen estradiol (CLIMARA - DOSED IN MG/24 HR) 0.05 mg/24hr patch Place 1 patch (0.05 mg total) onto the skin once a week. 12 patch 4  . meloxicam (MOBIC) 15 MG tablet TAKE 1 TABLET BY MOUTH ONCE DAILY--FILL 08/09/15-- 90 tablet 1  . progesterone (PROMETRIUM) 100 MG capsule Take 1 capsule (100 mg total) by mouth at bedtime. 90 capsule 4  . TURMERIC CURCUMIN PO Take by mouth.    . Vitamin D, Ergocalciferol, (DRISDOL) 50000 units CAPS capsule TAKE 1 CAPSULE (50,000 UNITS TOTAL) BY MOUTH EVERY 7 (SEVEN) DAYS. 12 capsule 0  . losartan-hydrochlorothiazide (HYZAAR) 100-25 MG tablet TAKE 1 TABLET BY MOUTH DAILY. 90 tablet 0   No facility-administered medications prior to visit.     ROS Review of Systems  Constitutional: Positive for unexpected weight change. Negative for chills, diaphoresis and fatigue.  HENT: Negative.   Eyes: Negative for visual disturbance.  Respiratory: Positive for shortness of breath. Negative for cough, chest tightness and wheezing.   Cardiovascular: Negative.  Negative for chest pain, palpitations and leg swelling.  Gastrointestinal: Negative for abdominal pain, constipation, diarrhea, nausea and vomiting.  Endocrine: Negative.   Genitourinary: Negative.  Negative  for decreased urine volume, difficulty urinating and dysuria.  Musculoskeletal: Negative.  Negative for back pain and myalgias.  Skin: Negative.  Negative for color change and pallor.  Allergic/Immunologic: Negative.   Neurological: Negative.  Negative for dizziness, weakness and light-headedness.  Hematological: Negative for adenopathy. Does not bruise/bleed easily.  Psychiatric/Behavioral: Negative.     Objective:  BP 128/60 (BP Location: Left Arm, Patient Position: Sitting, Cuff Size: Large)   Pulse 71   Temp 97.8 F (36.6 C) (Oral)   Resp 16   Ht 5\' 6"  (1.676 m)   Wt 198 lb (89.8 kg)   SpO2 98%   BMI 31.96 kg/m   BP Readings from Last 3 Encounters:  04/17/17 128/60  03/16/17 126/68  02/02/17 110/68    Wt Readings from Last 3 Encounters:  04/17/17 198 lb (89.8 kg)  03/16/17 197 lb (89.4 kg)  02/02/17 197 lb (89.4 kg)    Physical Exam  Constitutional: She is oriented to person, place, and time. No distress.  HENT:  Mouth/Throat: Oropharynx is clear and moist. No oropharyngeal exudate.  Eyes: Conjunctivae are normal. Left eye exhibits no discharge. No scleral icterus.  Neck: Normal range of motion. Neck supple. No JVD present. No thyromegaly present.  Cardiovascular: Normal rate, regular rhythm and normal heart sounds. Exam reveals no gallop and no friction rub.  No murmur heard. EKG---  Sinus  Rhythm  WITHIN NORMAL LIMITS- no change from the prior EKG   Pulmonary/Chest: Effort normal and breath sounds normal. No respiratory distress. She has no  wheezes. She has no rales.  Abdominal: Soft. Bowel sounds are normal. She exhibits no distension and no mass. There is no tenderness. There is no guarding.  Musculoskeletal: Normal range of motion. She exhibits no edema, tenderness or deformity.  Lymphadenopathy:    She has no cervical adenopathy.  Neurological: She is alert and oriented to person, place, and time.  Skin: Skin is warm and dry. No rash noted. She is not  diaphoretic. No erythema. No pallor.  Psychiatric: She has a normal mood and affect. Her behavior is normal. Judgment and thought content normal.  Vitals reviewed.   Lab Results  Component Value Date   WBC 7.5 04/17/2017   HGB 13.1 04/17/2017   HCT 39.3 04/17/2017   PLT 288.0 04/17/2017   GLUCOSE 85 04/17/2017   CHOL 178 04/17/2017   TRIG 88.0 04/17/2017   HDL 52.40 04/17/2017   LDLCALC 108 (H) 04/17/2017   ALT 13 04/17/2017   AST 14 04/17/2017   NA 139 04/17/2017   K 3.8 04/17/2017   CL 102 04/17/2017   CREATININE 1.24 (H) 04/17/2017   BUN 26 (H) 04/17/2017   CO2 29 04/17/2017   TSH 1.51 04/17/2017   INR 1.17 12/27/2013   HGBA1C 6.1 04/17/2017    US Breast Ltd Uni Left Inc Axilla  Result Date: 02/20/2017 CLINICAL DATA:  Screening recall for a possible left breast mass. EXAM: 2D DIGITAL DIAGNOSTIC UNILATERAL LEFT MAMMOGRAM WITH CAD AND ADJUNCT TOMO LEFT BREAST ULTRASOUND COMPARISON:  Previous exam(s). ACR Breast Density Category b: There are scattered areas of fibroglandular density. FINDINGS: There is a persistent oval possible bilobed mass in the central lateral aspect of the left breast, far posterior depth measuring 6 mm. Mammographic images were processed with CAD. Ultrasound of the superior and lateral aspect of the left breast demonstrates normal fibroglandular tissue. No masses or suspicious areas of shadowing are identified. IMPRESSION: There is a 6 mm benign-appearing asymmetry in the central to lateral far posterior left breast without a sonographic correlate. RECOMMENDATION: A six-month follow-up diagnostic left breast mammogram is recommended. I have discussed the findings and recommendations with the patient. Results were also provided in writing at the conclusion of the visit. If applicable, a reminder letter will be sent to the patient regarding the next appointment. BI-RADS CATEGORY  3: Probably benign. Electronically Signed   By: Ammie Ferrier M.D.   On: 02/20/2017  15:19   Mm Diag Breast Tomo Uni Left  Result Date: 02/20/2017 CLINICAL DATA:  Screening recall for a possible left breast mass. EXAM: 2D DIGITAL DIAGNOSTIC UNILATERAL LEFT MAMMOGRAM WITH CAD AND ADJUNCT TOMO LEFT BREAST ULTRASOUND COMPARISON:  Previous exam(s). ACR Breast Density Category b: There are scattered areas of fibroglandular density. FINDINGS: There is a persistent oval possible bilobed mass in the central lateral aspect of the left breast, far posterior depth measuring 6 mm. Mammographic images were processed with CAD. Ultrasound of the superior and lateral aspect of the left breast demonstrates normal fibroglandular tissue. No masses or suspicious areas of shadowing are identified. IMPRESSION: There is a 6 mm benign-appearing asymmetry in the central to lateral far posterior left breast without a sonographic correlate. RECOMMENDATION: A six-month follow-up diagnostic left breast mammogram is recommended. I have discussed the findings and recommendations with the patient. Results were also provided in writing at the conclusion of the visit. If applicable, a reminder letter will be sent to the patient regarding the next appointment. BI-RADS CATEGORY  3: Probably benign. Electronically Signed   By: Sharyn Lull  Theda Sers M.D.   On: 02/20/2017 15:19    Assessment & Plan:   Lakiyah was seen today for hypertension.  Diagnoses and all orders for this visit:  Essential hypertension, benign- Her blood pressure is well controlled.  Electrolytes and renal function are normal. -     Comprehensive metabolic panel; Future -     CBC with Differential/Platelet; Future -     TSH; Future -     losartan-hydrochlorothiazide (HYZAAR) 100-25 MG tablet; Take 1 tablet by mouth daily.  Vitamin B12 deficiency- She will continue B12 replacement therapy. -     CBC with Differential/Platelet; Future  Prediabetes-her A1c is at 6.1%.  She is prediabetic.  Medical therapy is not indicated. -     Hemoglobin A1c;  Future  Pure hypercholesterolemia- Her ASCVD risk score is only 8% so at this time I do not recommend a statin for CV risk reduction. -     Lipid panel; Future -     TSH; Future  Need for pneumococcal vaccination -     Pneumococcal polysaccharide vaccine 23-valent greater than or equal to 2yo subcutaneous/IM  DOE (dyspnea on exertion)- She has chronic DOE. Her EKG is negative for LVH or ischemia.  Her troponin is negative and BNP is normal.  I do not think this is related to CV disease.  I think this is related to weight gain and deconditioning.  I have asked her to slowly increase her exercise duration and intensity. -     Troponin I; Future -     Brain natriuretic peptide; Future -     EKG 12-Lead  Need for hepatitis C screening test -     Hepatitis C antibody; Future   I am having Kelsey Church "[name]" maintain her citalopram, progesterone, meloxicam, estradiol, Vitamin D (Ergocalciferol), cyanocobalamin, TURMERIC CURCUMIN PO, and losartan-hydrochlorothiazide.  Meds ordered this encounter  Medications  . losartan-hydrochlorothiazide (HYZAAR) 100-25 MG tablet    Sig: Take 1 tablet by mouth daily.    Dispense:  90 tablet    Refill:  1     Follow-up: Return in about 6 months (around 10/18/2017).  [name], MD

## 2017-04-18 ENCOUNTER — Encounter: Payer: Self-pay | Admitting: Internal Medicine

## 2017-04-18 LAB — HEPATITIS C ANTIBODY
Hepatitis C Ab: NONREACTIVE
SIGNAL TO CUT-OFF: 0.01 (ref <1.00)

## 2017-05-02 ENCOUNTER — Encounter: Payer: Self-pay | Admitting: Family Medicine

## 2017-05-02 ENCOUNTER — Ambulatory Visit (INDEPENDENT_AMBULATORY_CARE_PROVIDER_SITE_OTHER): Payer: Medicare Other | Admitting: Family Medicine

## 2017-05-02 VITALS — BP 118/66 | HR 69 | Temp 97.8°F | Ht 66.0 in | Wt 198.0 lb

## 2017-05-02 DIAGNOSIS — G8929 Other chronic pain: Secondary | ICD-10-CM

## 2017-05-02 DIAGNOSIS — M5136 Other intervertebral disc degeneration, lumbar region: Secondary | ICD-10-CM

## 2017-05-02 DIAGNOSIS — M545 Low back pain: Secondary | ICD-10-CM | POA: Diagnosis not present

## 2017-05-02 NOTE — Patient Instructions (Signed)
Please try the exercises.  Please follow up with me or let me know if you would like to pursue more investigations.

## 2017-05-02 NOTE — Progress Notes (Signed)
Kelsey Church - 67 y.o. female MRN [id number]  Date of birth: [birth year]  SUBJECTIVE:  Including CC & ROS.  Chief Complaint  Patient presents with  . Back Pain    Kelsey Church is a 67 y.o. female that is presenting with low back pain. Ongoing for one week, pain has increased over the past two weeks. Pain is located on lower back and is bilateral. Feels the pain in bilateral buttock as well. Pain is constant stabbing pain. Denies sitting or standing exacerbate the pain. She has been applying heat and Pennsaid for the pain. Denies injury or surgery. She does get regular adjustments at her chiropractor. The pain is similar to pain she had in October.      Review of Systems  Constitutional: Negative for fever.  HENT: Negative for congestion.   Respiratory: Negative for cough.   Cardiovascular: Negative for chest pain.  Gastrointestinal: Negative for abdominal pain.  Musculoskeletal: Positive for back pain.  Skin: Negative for color change.  Allergic/Immunologic: Negative for immunocompromised state.  Neurological: Negative for weakness.  Hematological: Negative for adenopathy.  Psychiatric/Behavioral: Negative for agitation.    HISTORY: Past Medical, Surgical, Social, and Family History Reviewed & Updated per EMR.   Pertinent Historical Findings include:  Past Medical History:  Diagnosis Date  . Arthritis    PAIN AND OA LEFT KNEE; S/P RIGHT TOTAL KNEE ARTHROPLASTY 06/10/13  . Cancer ([place])    melanoma in 2 sights  . Complication of anesthesia    "chills every evening at sundown x 2 weeks"- no fever  . Depression   . Dysphagia   . GERD (gastroesophageal reflux disease)   . History of shingles    NO RESIDUAL PROBLEMS  . Hypertension   . Melanoma in situ ([place])   . Pain    LOWER BACK PAIN  . Sleep apnea 09/2011   CPAP  . Sleep apnea     Past Surgical History:  Procedure Laterality Date  . APPENDECTOMY  1958  . COLONOSCOPY    . COLONOSCOPY W/ POLYPECTOMY     7  polyps  . FOOT SURGERY Right    removal plantar wart  . GALLBLADDER SURGERY  02/14/1990  . INJECTION KNEE  09/2011   right   . KNEE ARTHROSCOPY  1996  . MELANOMA EXCISION  1992   right chin, back  . POLYPECTOMY    . SALPINGOOPHORECTOMY  2009   Right  . TOTAL KNEE ARTHROPLASTY Right 06/10/2013   Procedure: RIGHT TOTAL KNEE ARTHROPLASTY;  Surgeon: [name], MD;  Location: [facility];  Service: Orthopedics;  Laterality: Right;  . TOTAL KNEE ARTHROPLASTY Left 12/16/2013   Procedure: LEFT TOTAL KNEE ARTHROPLASTY;  Surgeon: [name], MD;  Location: [facility];  Service: Orthopedics;  Laterality: Left;    No Known Allergies  Family History  Problem Relation Age of Onset  . Heart disease Father   . Heart attack Father   . Hypertension Father   . Osteoporosis Mother   . Breast cancer Mother   . Cancer Maternal Grandmother        Breast and Uterine Cancer  . Breast cancer Maternal Grandmother   . Thyroid cancer Maternal Grandmother   . Hypertension Sister   . Diabetes Sister   . Colon polyps Sister   . Colon cancer Neg Hx   . Rectal cancer Neg Hx   . Stomach cancer Neg Hx   . Esophageal cancer Neg Hx      Social History  Socioeconomic History  . Marital status: Widowed    Spouse name: Not on file  . Number of children: 1  . Years of education: Not on file  . Highest education level: Not on file  Social Needs  . Financial resource strain: Not on file  . Food insecurity - worry: Not on file  . Food insecurity - inability: Not on file  . Transportation needs - medical: Not on file  . Transportation needs - non-medical: Not on file  Occupational History  . Occupation: CMA    Employer: [employer]  Tobacco Use  . Smoking status: Never Smoker  . Smokeless tobacco: Never Used  Substance and Sexual Activity  . Alcohol use: No    Alcohol/week: 0.0 oz  . Drug use: No  . Sexual activity: No    Comment: 1st intercourse- 18, partners- 1,   Other Topics Concern  . Not  on file  Social History Narrative   Caffienated drinks-no   Seat belt use often-yes   Regular Exercise-yes   Smoke alarm in the home-yes   Firearms/guns in the home-no   History of physical abuse-no                 PHYSICAL EXAM:  VS: BP 118/66 (BP Location: Right Arm, Patient Position: Sitting, Cuff Size: Normal)   Pulse 69   Temp 97.8 F (36.6 C) (Oral)   Ht 5\' 6"  (1.676 m)   Wt 198 lb (89.8 kg)   SpO2 97%   BMI 31.96 kg/m  Physical Exam Gen: NAD, alert, cooperative with exam, well-appearing ENT: normal lips, normal nasal mucosa,  Eye: normal EOM, normal conjunctiva and lids CV:  no edema, +2 pedal pulses   Resp: no accessory muscle use, non-labored,  Skin: no rashes, no areas of induration  Neuro: normal tone, normal sensation to touch Psych:  normal insight, alert and oriented MSK:  Back Exam:  Inspection: Unremarkable  Palpable tenderness: mild TTP of the lower back, no midline lumbar tenderness  Range of Motion:  Flexion 45 deg; Extension 45 deg; Side Bending to 45 deg bilaterally; Rotation to 45 deg bilaterally  Leg strength: Quad: 5/5 Hamstring: 5/5 Hip flexor: 5/5  Strength at foot: Plantar-flexion: 5/5 Dorsi-flexion: 5/5 Eversion: 5/5 Inversion: 5/5  Reflexes: 2+ at both patellar tendons SLR laying: Negative  XSLR laying: Negative  FABER: negative. Neurovascularly intact      ASSESSMENT & PLAN:   No problem-specific Assessment & Plan notes found for this encounter.

## 2017-05-03 DIAGNOSIS — G8929 Other chronic pain: Secondary | ICD-10-CM | POA: Insufficient documentation

## 2017-05-03 DIAGNOSIS — M545 Low back pain: Principal | ICD-10-CM

## 2017-05-03 NOTE — Assessment & Plan Note (Signed)
Her symptoms are similar to her presentation in October. She has not been doing her regular activities  - counseled on HEP  - counseled on conservative therapies - advised to follow up if no improvement

## 2017-06-05 ENCOUNTER — Ambulatory Visit (INDEPENDENT_AMBULATORY_CARE_PROVIDER_SITE_OTHER): Payer: Medicare Other | Admitting: Family Medicine

## 2017-06-05 ENCOUNTER — Encounter: Payer: Self-pay | Admitting: Family Medicine

## 2017-06-05 DIAGNOSIS — M5136 Other intervertebral disc degeneration, lumbar region: Secondary | ICD-10-CM

## 2017-06-05 DIAGNOSIS — M7551 Bursitis of right shoulder: Secondary | ICD-10-CM | POA: Diagnosis not present

## 2017-06-05 MED ORDER — PREDNISONE 50 MG PO TABS: 50.0000 mg | ORAL_TABLET | Freq: Every day | ORAL | 0 refills | Status: DC

## 2017-06-05 MED ORDER — KETOROLAC TROMETHAMINE 60 MG/2ML IM SOLN
60.0000 mg | Freq: Once | INTRAMUSCULAR | Status: AC
  Administered 2017-06-05: 60 mg via INTRAMUSCULAR

## 2017-06-05 MED ORDER — METHYLPREDNISOLONE ACETATE 80 MG/ML IJ SUSP
80.0000 mg | Freq: Once | INTRAMUSCULAR | Status: AC
  Administered 2017-06-05: 80 mg via INTRAMUSCULAR

## 2017-06-05 NOTE — Patient Instructions (Addendum)
Good to see you  Ice 20 minutes 2 times daily. Usually after activity and before bed. pennsaid pinkie amount topically 2 times daily as needed.   2 injections today   Starting tomorrow prednisone daily for 5 days (stop the meloxicam, duexis and turmeric) See me again Monday (ok to double book)

## 2017-06-05 NOTE — Assessment & Plan Note (Signed)
Worsening symptoms.  May need repeat injection.  Has been on the 5 months since previous injection.  Patient is to try to increase activity slowly.  We will see how patient responds to the steroid.  If not we will injected follow-up.

## 2017-06-05 NOTE — Addendum Note (Signed)
Addended by: Douglass Rivers T on: 06/05/2017 02:22 PM   Modules accepted: Orders

## 2017-06-05 NOTE — Progress Notes (Signed)
[facility] [address] [place], [place] [address] Phone: [phone] Subjective:    CC: Right shoulder pain follow-up, back pain  [id number]  Kelsey Church is a 67 y.o. female coming in with complaint of right shoulder pain since Thursday of last week. She is also having left lumbar spine pain.   Patient was picking up a pot full of dirt. Patient felt a pull in her shoulder. Has been using pennsaid and ice which does not alleviate her pain. Pain is in anterior portion of shoulder and radiates to posterior aspect. Patient is unable to sleep due to pain at night.  Was found to have a bursitis previously and was given an injection and did very well.  Injected February 02, 2017  Her back has been having pain for 1 month. She uses topical analgesic, heat, ice, and got new shoes. Patient had some point tenderness on the left hip and thigh. Patient does have hip pain with lying down. Does note a pop with flexion when bending over to grasp the pot of soil.  Known to have degenerative disc disease noted on x-ray previously.     Past Medical History:  Diagnosis Date  . Arthritis    PAIN AND OA LEFT KNEE; S/P RIGHT TOTAL KNEE ARTHROPLASTY 06/10/13  . Cancer ([place])    melanoma in 2 sights  . Complication of anesthesia    "chills every evening at sundown x 2 weeks"- no fever  . Depression   . Dysphagia   . GERD (gastroesophageal reflux disease)   . History of shingles    NO RESIDUAL PROBLEMS  . Hypertension   . Melanoma in situ ([place])   . Pain    LOWER BACK PAIN  . Sleep apnea 09/2011   CPAP  . Sleep apnea    Past Surgical History:  Procedure Laterality Date  . APPENDECTOMY  1958  . COLONOSCOPY    . COLONOSCOPY W/ POLYPECTOMY     7 polyps  . FOOT SURGERY Right    removal plantar wart  . GALLBLADDER SURGERY  02/14/1990  . INJECTION KNEE  09/2011   right   . KNEE ARTHROSCOPY  1996  . MELANOMA EXCISION  1992   right chin, back  . POLYPECTOMY    .  SALPINGOOPHORECTOMY  2009   Right  . TOTAL KNEE ARTHROPLASTY Right 06/10/2013   Procedure: RIGHT TOTAL KNEE ARTHROPLASTY;  Surgeon: [name], MD;  Location: [facility];  Service: Orthopedics;  Laterality: Right;  . TOTAL KNEE ARTHROPLASTY Left 12/16/2013   Procedure: LEFT TOTAL KNEE ARTHROPLASTY;  Surgeon: [name], MD;  Location: [facility];  Service: Orthopedics;  Laterality: Left;   Social History   Socioeconomic History  . Marital status: Widowed    Spouse name: Not on file  . Number of children: 1  . Years of education: Not on file  . Highest education level: Not on file  Occupational History  . Occupation: CMA    Employer: [employer]  . Financial resource strain: Not on file  . Food insecurity:    Worry: Not on file    Inability: Not on file  . Transportation needs:    Medical: Not on file    Non-medical: Not on file  Tobacco Use  . Smoking status: Never Smoker  . Smokeless tobacco: Never Used  Substance and Sexual Activity  . Alcohol use: No    Alcohol/week: 0.0 oz  . Drug use: No  .  Sexual activity: Never    Comment: 1st intercourse- 18, partners- 1,   Lifestyle  . Physical activity:    Days per week: Not on file    Minutes per session: Not on file  . Stress: Not on file  Relationships  . Social connections:    Talks on phone: Not on file    Gets together: Not on file    Attends religious service: Not on file    Active member of club or organization: Not on file    Attends meetings of clubs or organizations: Not on file    Relationship status: Not on file  Other Topics Concern  . Not on file  Social History Narrative   Caffienated drinks-no   Seat belt use often-yes   Regular Exercise-yes   Smoke alarm in the home-yes   Firearms/guns in the home-no   History of physical abuse-no               No Known Allergies Family History  Problem Relation Age of Onset  . Heart disease Father   . Heart attack Father   . Hypertension  Father   . Osteoporosis Mother   . Breast cancer Mother   . Cancer Maternal Grandmother        Breast and Uterine Cancer  . Breast cancer Maternal Grandmother   . Thyroid cancer Maternal Grandmother   . Hypertension Sister   . Diabetes Sister   . Colon polyps Sister   . Colon cancer Neg Hx   . Rectal cancer Neg Hx   . Stomach cancer Neg Hx   . Esophageal cancer Neg Hx      Past medical history, social, surgical and family history all reviewed in electronic medical record.  No pertanent information unless stated regarding to the chief complaint.   Review of Systems:Review of systems updated and as accurate as of 06/05/17  No headache, visual changes, nausea, vomiting, diarrhea, constipation, dizziness, abdominal pain, skin rash, fevers, chills, night sweats, weight loss, swollen lymph nodes, body aches, joint swelling, chest pain, shortness of breath, mood changes.  Positive muscle aches  Objective  Blood pressure 116/68, pulse 75, height 5\' 6"  (1.676 m), weight 195 lb (88.5 kg), SpO2 97 %. Systems examined below as of 06/05/17   General: No apparent distress alert and oriented x3 mood and affect normal, dressed appropriately.  HEENT: Pupils equal, extraocular movements intact  Respiratory: Patient's speak in full sentences and does not appear short of breath  Cardiovascular: No lower extremity edema, non tender, no erythema  Skin: Warm dry intact with no signs of infection or rash on extremities or on axial skeleton.  Abdomen: Soft nontender  Neuro: Cranial nerves II through XII are intact, neurovascularly intact in all extremities with 2+ DTRs and 2+ pulses.  Lymph: No lymphadenopathy of posterior or anterior cervical chain or axillae bilaterally.  Gait mild antalgic MSK:  Non tender with full range of motion and good stability and symmetric strength and tone of shoulders, elbows, wrist, hip, knee and ankles bilaterally.   Arthritic changes of multiple joints  Back exam shows  the patient has some loss of lordosis.  Mild positive straight leg test on the left side.  Pain is out of proportion to the lateral aspect of the hip and back.  Neurovascularly intact distally with good strength.  Left shoulder exam does show that patient has positive impingement noted with Hawkins and Neer's.  Near full range of motion.  Strength 4+ out of 5  compared to the contralateral side.  Neck exam does have some tightness noted.  Negative Spurling's test.      Impression and Recommendations:     This case required medical decision making of moderate complexity.      Note: This dictation was prepared with Dragon dictation along with smaller phrase technology. Any transcriptional errors that result from this process are unintentional.

## 2017-06-05 NOTE — Assessment & Plan Note (Signed)
Worsening symptoms with more of a lumbar radiculopathy.  Concerned with this at this time.  I do believe the patient does have a spondylolisthesis noted on x-rays previously at L3-L4 with 4 mm of slippage.  Patient could be having some spinal stenosis.  Given prednisone and encourage patient to restart the gabapentin.  Discussed icing regimen and home exercises.  Discussed the possibility of needing an MRI.  Follow-up again in 1 week

## 2017-06-07 ENCOUNTER — Other Ambulatory Visit: Payer: Self-pay | Admitting: Obstetrics & Gynecology

## 2017-06-12 ENCOUNTER — Ambulatory Visit: Payer: Medicare Other | Admitting: Family Medicine

## 2017-07-25 ENCOUNTER — Encounter: Payer: Self-pay | Admitting: Family Medicine

## 2017-07-27 ENCOUNTER — Other Ambulatory Visit: Payer: Self-pay | Admitting: Family Medicine

## 2017-07-27 NOTE — Telephone Encounter (Signed)
Refill done.  

## 2017-08-02 ENCOUNTER — Encounter: Payer: Self-pay | Admitting: Family Medicine

## 2017-08-02 ENCOUNTER — Ambulatory Visit: Payer: Self-pay

## 2017-08-02 ENCOUNTER — Ambulatory Visit: Payer: Medicare Other | Admitting: Sports Medicine

## 2017-08-02 ENCOUNTER — Ambulatory Visit (INDEPENDENT_AMBULATORY_CARE_PROVIDER_SITE_OTHER): Payer: Medicare Other | Admitting: Family Medicine

## 2017-08-02 VITALS — BP 118/68 | HR 77 | Ht 66.0 in

## 2017-08-02 DIAGNOSIS — M7551 Bursitis of right shoulder: Secondary | ICD-10-CM

## 2017-08-02 DIAGNOSIS — M25552 Pain in left hip: Secondary | ICD-10-CM | POA: Diagnosis not present

## 2017-08-02 DIAGNOSIS — S7002XA Contusion of left hip, initial encounter: Secondary | ICD-10-CM | POA: Diagnosis not present

## 2017-08-02 MED ORDER — PREDNISONE 20 MG PO TABS: ORAL_TABLET | ORAL | 0 refills | Status: DC

## 2017-08-02 NOTE — Assessment & Plan Note (Signed)
Contusion over the left lateral hip.  Worsening pain will consider injection when patient returns.  No hematoma enough to aspirate at this time.

## 2017-08-02 NOTE — Patient Instructions (Signed)
Am sorry you are hurting.  Prednsione as prescribed.  Gave you an injeciton in the shoulder  Arnica lotion 2 times daily to the bruise Heat 10 minutes to the hip 2 times a day to help break up the bruise.  See me again when you return or write me if you would like the MRI for the shoulder if injection does not help

## 2017-08-02 NOTE — Assessment & Plan Note (Signed)
Patient given injection again today.  Concern for potential labral pathology with the findings.  We discussed icing regimen.  [place].  Follow-up may need MRI

## 2017-08-02 NOTE — Progress Notes (Signed)
[name] [place] [place], [place] [address] Phone: [phone] Subjective:      CC: Shoulder pain and hip pain  TSV:[id number]  Kelsey Church is a 67 y.o. female coming in with complaint of right shoulder and left hip. Pain began 2 weeks ago. She had a fall 2 weeks ago and fell on the left hip. Pain over lateral aspect of hip. Painful in the morning. Has done laser therapy with chiropracter which did not help.   Shoulder has been getting worse as well as she is unable to lay on her side and has to lie on the shoulder more. Pain over entire joint. Patient has been using Duexis and turmeric.      Past Medical History:  Diagnosis Date  . Arthritis    PAIN AND OA LEFT KNEE; S/P RIGHT TOTAL KNEE ARTHROPLASTY 06/10/13  . Cancer ([place])    melanoma in 2 sights  . Complication of anesthesia    "chills every evening at sundown x 2 weeks"- no fever  . Depression   . Dysphagia   . GERD (gastroesophageal reflux disease)   . History of shingles    NO RESIDUAL PROBLEMS  . Hypertension   . Melanoma in situ ([place])   . Pain    LOWER BACK PAIN  . Sleep apnea 09/2011   CPAP  . Sleep apnea    Past Surgical History:  Procedure Laterality Date  . APPENDECTOMY  1958  . COLONOSCOPY    . COLONOSCOPY W/ POLYPECTOMY     7 polyps  . FOOT SURGERY Right    removal plantar wart  . GALLBLADDER SURGERY  02/14/1990  . INJECTION KNEE  09/2011   right   . KNEE ARTHROSCOPY  1996  . MELANOMA EXCISION  1992   right chin, back  . POLYPECTOMY    . SALPINGOOPHORECTOMY  2009   Right  . TOTAL KNEE ARTHROPLASTY Right 06/10/2013   Procedure: RIGHT TOTAL KNEE ARTHROPLASTY;  Surgeon: [name], MD;  Location: [facility];  Service: Orthopedics;  Laterality: Right;  . TOTAL KNEE ARTHROPLASTY Left 12/16/2013   Procedure: LEFT TOTAL KNEE ARTHROPLASTY;  Surgeon: [name], MD;  Location: [facility];  Service: Orthopedics;  Laterality: Left;   Social History    Socioeconomic History  . Marital status: Widowed    Spouse name: Not on file  . Number of children: 1  . Years of education: Not on file  . Highest education level: Not on file  Occupational History  . Occupation: CMA    Employer: [employer]  . Financial resource strain: Not on file  . Food insecurity:    Worry: Not on file    Inability: Not on file  . Transportation needs:    Medical: Not on file    Non-medical: Not on file  Tobacco Use  . Smoking status: Never Smoker  . Smokeless tobacco: Never Used  Substance and Sexual Activity  . Alcohol use: No    Alcohol/week: 0.0 oz  . Drug use: No  . Sexual activity: Never    Comment: 1st intercourse- 18, partners- 1,   Lifestyle  . Physical activity:    Days per week: Not on file    Minutes per session: Not on file  . Stress: Not on file  Relationships  . Social connections:    Talks on phone: Not on file    Gets together: Not on file    Attends religious  service: Not on file    Active member of club or organization: Not on file    Attends meetings of clubs or organizations: Not on file    Relationship status: Not on file  Other Topics Concern  . Not on file  Social History Narrative   Caffienated drinks-no   Seat belt use often-yes   Regular Exercise-yes   Smoke alarm in the home-yes   Firearms/guns in the home-no   History of physical abuse-no               No Known Allergies Family History  Problem Relation Age of Onset  . Heart disease Father   . Heart attack Father   . Hypertension Father   . Osteoporosis Mother   . Breast cancer Mother   . Cancer Maternal Grandmother        Breast and Uterine Cancer  . Breast cancer Maternal Grandmother   . Thyroid cancer Maternal Grandmother   . Hypertension Sister   . Diabetes Sister   . Colon polyps Sister   . Colon cancer Neg Hx   . Rectal cancer Neg Hx   . Stomach cancer Neg Hx   . Esophageal cancer Neg Hx      Past medical history,  social, surgical and family history all reviewed in electronic medical record.  No pertanent information unless stated regarding to the chief complaint.   Review of Systems:Review of systems updated and as accurate as of 08/02/17  No headache, visual changes, nausea, vomiting, diarrhea, constipation, dizziness, abdominal pain, skin rash, fevers, chills, night sweats, weight loss, swollen lymph nodes, body aches, joint swelling, muscle aches, chest pain, shortness of breath, mood changes.   Objective  There were no vitals taken for this visit. Systems examined below as of 08/02/17   General: No apparent distress alert and oriented x3 mood and affect normal, dressed appropriately.  HEENT: Pupils equal, extraocular movements intact  Respiratory: Patient's speak in full sentences and does not appear short of breath  Cardiovascular: No lower extremity edema, non tender, no erythema  Skin: Warm dry intact with no signs of infection or rash on extremities or on axial skeleton.  Abdomen: Soft nontender  Neuro: Cranial nerves II through XII are intact, neurovascularly intact in all extremities with 2+ DTRs and 2+ pulses.  Lymph: No lymphadenopathy of posterior or anterior cervical chain or axillae bilaterally.  Gait antalgic gait MSK:  Non tender with full range of motion and good stability and symmetric strength and tone of , elbows, wrist, and ankles bilaterally.  Bilateral knee replacement noted Shoulder: Right Inspection reveals no abnormalities, atrophy or asymmetry. Palpation is normal with no tenderness over AC joint or bicipital groove. ROM is full in all planes passively. Rotator cuff strength n 4 out of 5 signs of impingement with positive Neer and Hawkin's tests, but negative empty can sign. Speeds and Yergason's tests normal. Positive O'Brien Normal scapular function observed. Positive painful arc No apprehension sign Contralateral shoulder unremarkable  Left hip shows a contusion  on the lateral aspect.  Severely tender over the greater trochanter area.  Full range of motion of the hip.   Procedure: Real-time Ultrasound Guided Injection of right glenohumeral joint Device: GE Logiq E  Ultrasound guided injection is preferred based studies that show increased duration, increased effect, greater accuracy, decreased procedural pain, increased response rate with ultrasound guided versus blind injection.  Verbal informed consent obtained.  Time-out conducted.  Noted no overlying erythema, induration, or other signs of  local infection.  Skin prepped in a sterile fashion.  Local anesthesia: Topical Ethyl chloride.  With sterile technique and under real time ultrasound guidance:  Joint visualized.  23g 1  inch needle inserted posterior approach. Pictures taken for needle placement. Patient did have injection of 2 cc of 1% lidocaine, 2 cc of 0.5% Marcaine, and 1.0 cc of Kenalog 40 mg/dL. Completed without difficulty  Pain immediately resolved suggesting accurate placement of the medication.  Advised to call if fevers/chills, erythema, induration, drainage, or persistent bleeding.  Images permanently stored and available for review in the ultrasound unit.  Impression: Technically successful ultrasound guided injection.    Impression and Recommendations:     This case required medical decision making of moderate complexity.      Note: This dictation was prepared with Dragon dictation along with smaller phrase technology. Any transcriptional errors that result from this process are unintentional.

## 2017-08-03 ENCOUNTER — Encounter: Payer: Self-pay | Admitting: Family Medicine

## 2017-08-27 NOTE — Progress Notes (Signed)
[name] [place], [place] [address] Phone: [phone] Subjective:    I'm seeing this patient by the request  of:    CC: Left hip pain  YQI:[id number]  Kelsey Church is a 67 y.o. female coming in with complaint of left hip pain. She is able to sleep on that side since Sunday. Does still have pain daily but intermittent. Was on predisone for mission trip and states that the medication helped alleviate her pain during the trip. X-rays previously did show some facet arthropathy.  Did have an injection for right shoulder last visit. Patient did notice an improvement in pain but does have "twinges" of pain in the right shoulder.       Past Medical History:  Diagnosis Date  . Arthritis    PAIN AND OA LEFT KNEE; S/P RIGHT TOTAL KNEE ARTHROPLASTY 06/10/13  . Cancer ([place])    melanoma in 2 sights  . Complication of anesthesia    "chills every evening at sundown x 2 weeks"- no fever  . Depression   . Dysphagia   . GERD (gastroesophageal reflux disease)   . History of shingles    NO RESIDUAL PROBLEMS  . Hypertension   . Melanoma in situ ([place])   . Pain    LOWER BACK PAIN  . Sleep apnea 09/2011   CPAP  . Sleep apnea    Past Surgical History:  Procedure Laterality Date  . APPENDECTOMY  1958  . COLONOSCOPY    . COLONOSCOPY W/ POLYPECTOMY     7 polyps  . FOOT SURGERY Right    removal plantar wart  . GALLBLADDER SURGERY  02/14/1990  . INJECTION KNEE  09/2011   right   . KNEE ARTHROSCOPY  1996  . MELANOMA EXCISION  1992   right chin, back  . POLYPECTOMY    . SALPINGOOPHORECTOMY  2009   Right  . TOTAL KNEE ARTHROPLASTY Right 06/10/2013   Procedure: RIGHT TOTAL KNEE ARTHROPLASTY;  Surgeon: [name], MD;  Location: [facility];  Service: Orthopedics;  Laterality: Right;  . TOTAL KNEE ARTHROPLASTY Left 12/16/2013   Procedure: LEFT TOTAL KNEE ARTHROPLASTY;  Surgeon: [name], MD;  Location: [facility];  Service: Orthopedics;   Laterality: Left;   Social History   Socioeconomic History  . Marital status: Widowed    Spouse name: Not on file  . Number of children: 1  . Years of education: Not on file  . Highest education level: Not on file  Occupational History  . Occupation: CMA    Employer: [employer]  . Financial resource strain: Not on file  . Food insecurity:    Worry: Not on file    Inability: Not on file  . Transportation needs:    Medical: Not on file    Non-medical: Not on file  Tobacco Use  . Smoking status: Never Smoker  . Smokeless tobacco: Never Used  Substance and Sexual Activity  . Alcohol use: No    Alcohol/week: 0.0 oz  . Drug use: No  . Sexual activity: Never    Comment: 1st intercourse- 18, partners- 1,   Lifestyle  . Physical activity:    Days per week: Not on file    Minutes per session: Not on file  . Stress: Not on file  Relationships  . Social connections:    Talks on phone: Not on file    Gets together: Not on file    Attends  religious service: Not on file    Active member of club or organization: Not on file    Attends meetings of clubs or organizations: Not on file    Relationship status: Not on file  Other Topics Concern  . Not on file  Social History Narrative   Caffienated drinks-no   Seat belt use often-yes   Regular Exercise-yes   Smoke alarm in the home-yes   Firearms/guns in the home-no   History of physical abuse-no               No Known Allergies Family History  Problem Relation Age of Onset  . Heart disease Father   . Heart attack Father   . Hypertension Father   . Osteoporosis Mother   . Breast cancer Mother   . Cancer Maternal Grandmother        Breast and Uterine Cancer  . Breast cancer Maternal Grandmother   . Thyroid cancer Maternal Grandmother   . Hypertension Sister   . Diabetes Sister   . Colon polyps Sister   . Colon cancer Neg Hx   . Rectal cancer Neg Hx   . Stomach cancer Neg Hx   . Esophageal cancer Neg  Hx      Past medical history, social, surgical and family history all reviewed in electronic medical record.  No pertanent information unless stated regarding to the chief complaint.   Review of Systems:Review of systems updated and as accurate as of 08/27/17  No headache, visual changes, nausea, vomiting, diarrhea, constipation, dizziness, abdominal pain, skin rash, fevers, chills, night sweats, weight loss, swollen lymph nodes, body aches, joint swelling,  chest pain, shortness of breath, mood changes.  Positive muscle aches  Objective  There were no vitals taken for this visit. Systems examined below as of 08/27/17   General: No apparent distress alert and oriented x3 mood and affect normal, dressed appropriately.  HEENT: Pupils equal, extraocular movements intact  Respiratory: Patient's speak in full sentences and does not appear short of breath  Cardiovascular: No lower extremity edema, non tender, no erythema  Skin: Warm dry intact with no signs of infection or rash on extremities or on axial skeleton.  Abdomen: Soft nontender  Neuro: Cranial nerves II through XII are intact, neurovascularly intact in all extremities with 2+ DTRs and 2+ pulses.  Lymph: No lymphadenopathy of posterior or anterior cervical chain or axillae bilaterally.  Gait normal with good balance and coordination.  MSK:  Non tender with full range of motion and good stability and symmetric strength and tone of shoulders, elbows, wrist, hip, knee and ankles bilaterally.  Arthritic changes of multiple joints  Subjective:  CC:   HPI:   Past medical history, Surgical history, Family history not pertinant except as noted below, Social history, Allergies, and medications have been entered into the medical record, reviewed, and no changes needed.   Review of Systems: No headache, visual changes, nausea, vomiting, diarrhea, constipation, dizziness, abdominal pain, skin rash, fevers, chills, night sweats, swollen lymph  nodes, weight loss, chest pain, body aches, joint swelling, muscle aches, shortness of breath, mood changes, visual or auditory hallucinations.  Objective: .v General: Well Developed, well nourished, and in no acute distress.  Neuro: Alert and oriented x3, extra-ocular muscles intact, sensation grossly intact.  HEENT: Normocephalic, atraumatic, pupils equal round reactive to light,  Neck: neck supple, no masses, no lymphadenopathy, thyroid nonpalpable.  Skin: Warm and dry, no rashes noted.  Cardiac: no lower extremity edema Respiratory: Not using accessory  muscles, speaking in full sentences.  Abdominal: Soft, nontender Musculoskeletal: Arthritic changes of multiple joints  Back  exam shows loss of lordosis.  Significant tightness of the musculature especially of the left hamstring with positive radicular symptoms.  Patient neurovascular intact distally.  Severe tenderness though over the greater trochanteric area on the left side.   Procedure: Real-time Ultrasound Guided Injection of left  greater trochanteric bursitis secondary to patient's body habitus Device: GE Logiq Q7  Ultrasound guided injection is preferred based studies that show increased duration, increased effect, greater accuracy, decreased procedural pain, increased response rate, and decreased cost with ultrasound guided versus blind injection.  Verbal informed consent obtained.  Time-out conducted.  Noted no overlying erythema, induration, or other signs of local infection.  Skin prepped in a sterile fashion.  Local anesthesia: Topical Ethyl chloride.  With sterile technique and under real time ultrasound guidance:  Greater trochanteric area was visualized and patient's bursa was noted. A 22-gauge 3 inch needle was inserted and 4 cc of 0.5% Marcaine and 1 cc of Kenalog 40 mg/dL was injected. Pictures taken Completed without difficulty  Pain immediately resolved suggesting accurate placement of the medication.  Advised to  call if fevers/chills, erythema, induration, drainage, or persistent bleeding.  Images permanently stored and available for review in the ultrasound unit.  Impression: Technically successful ultrasound guided injection.   Impression and Recommendations:     This case required medical decision making of moderate complexity.      Note: This dictation was prepared with Dragon dictation along with smaller phrase technology. Any transcriptional errors that result from this process are unintentional.

## 2017-08-28 ENCOUNTER — Ambulatory Visit (INDEPENDENT_AMBULATORY_CARE_PROVIDER_SITE_OTHER): Payer: Medicare Other | Admitting: Family Medicine

## 2017-08-28 ENCOUNTER — Encounter: Payer: Self-pay | Admitting: Family Medicine

## 2017-08-28 ENCOUNTER — Ambulatory Visit: Payer: Self-pay

## 2017-08-28 ENCOUNTER — Ambulatory Visit
Admission: RE | Admit: 2017-08-28 | Discharge: 2017-08-28 | Disposition: A | Discharge status: Home / Self Care | Payer: Medicare Other | Source: Ambulatory Visit | Attending: Obstetrics & Gynecology | Admitting: Obstetrics & Gynecology

## 2017-08-28 VITALS — BP 102/64 | HR 74 | Ht 66.0 in | Wt 200.0 lb

## 2017-08-28 DIAGNOSIS — N6320 Unspecified lump in the left breast, unspecified quadrant: Secondary | ICD-10-CM

## 2017-08-28 DIAGNOSIS — M7062 Trochanteric bursitis, left hip: Secondary | ICD-10-CM

## 2017-08-28 DIAGNOSIS — M5136 Other intervertebral disc degeneration, lumbar region: Secondary | ICD-10-CM | POA: Diagnosis not present

## 2017-08-28 DIAGNOSIS — M25552 Pain in left hip: Secondary | ICD-10-CM | POA: Diagnosis not present

## 2017-08-28 DIAGNOSIS — R102 Pelvic and perineal pain: Secondary | ICD-10-CM | POA: Insufficient documentation

## 2017-08-28 DIAGNOSIS — R928 Other abnormal and inconclusive findings on diagnostic imaging of breast: Secondary | ICD-10-CM | POA: Diagnosis not present

## 2017-08-28 IMAGING — MG DIGITAL DIAGNOSTIC UNILATERAL LEFT MAMMOGRAM WITH TOMO AND CAD
8 series · 8 of 24 positions shown · non-contrast
Comparison: Previous exam(s).

CLINICAL DATA: Six-month follow-up for a probably benign asymmetry
in the left breast.

EXAM:
DIGITAL DIAGNOSTIC UNILATERAL LEFT MAMMOGRAM WITH CAD AND TOMO

[L CC synth-2D (1 of 3)]
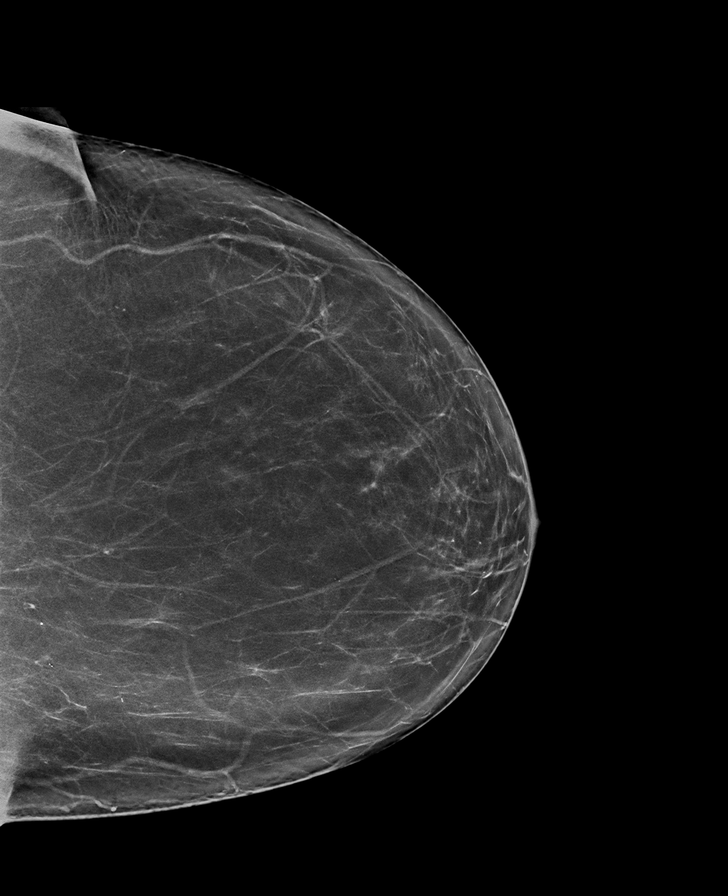

[L CC synth-2D (2 of 3)]
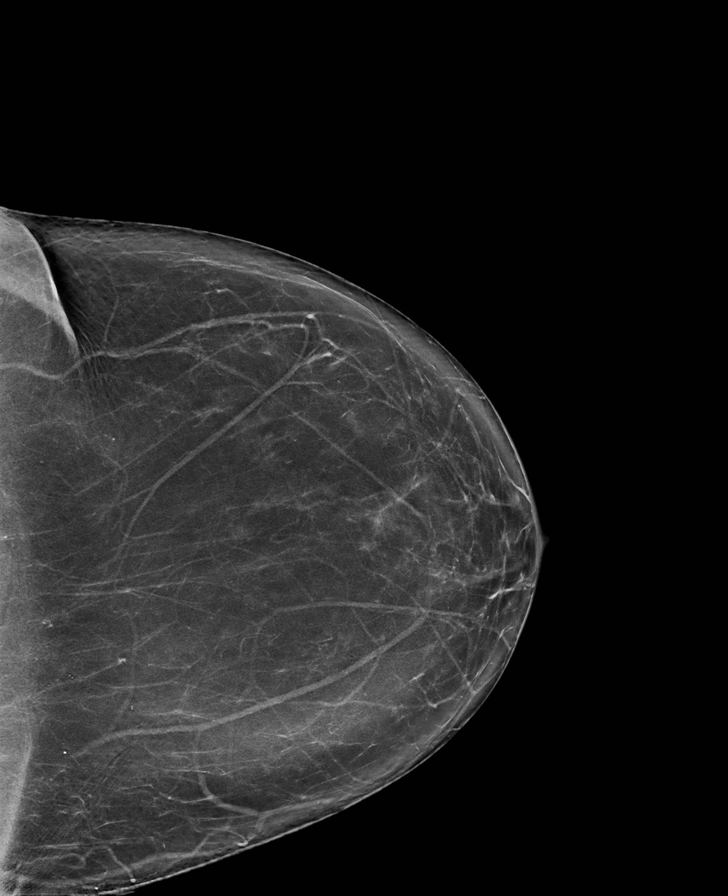

[L MLO synth-2D]
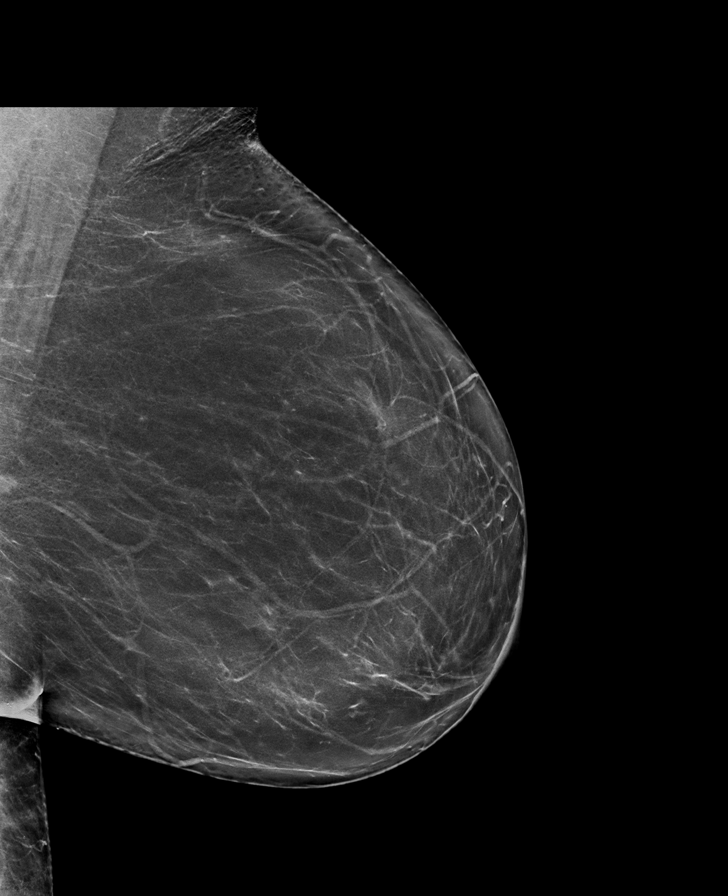

[L CC synth-2D (3 of 3)]
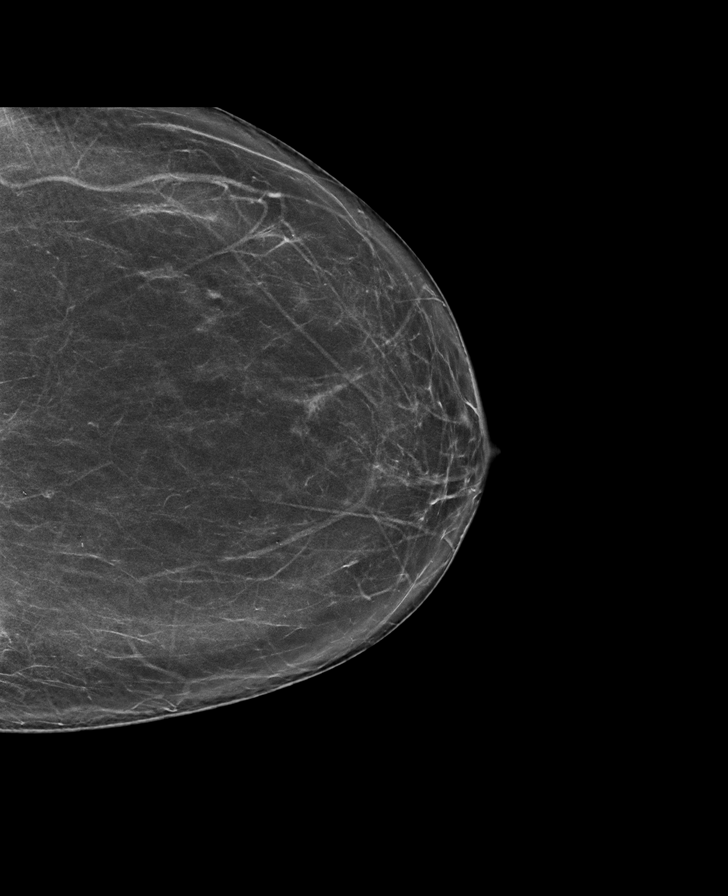

[L MLO tomo · tomo slice 41/81.0]
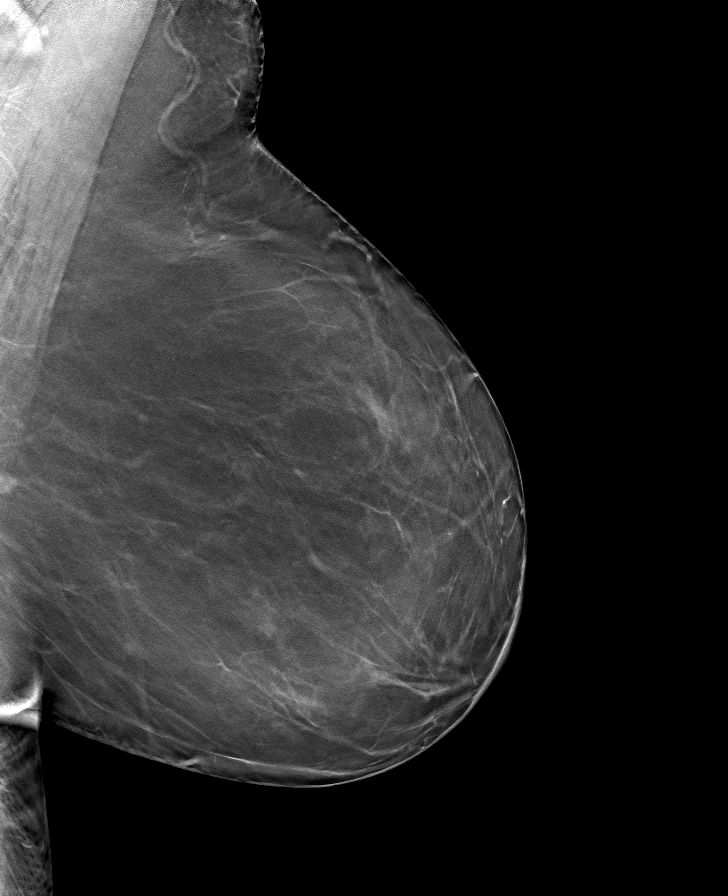

[L CC tomo (1 of 3) · tomo slice 39/77.0]
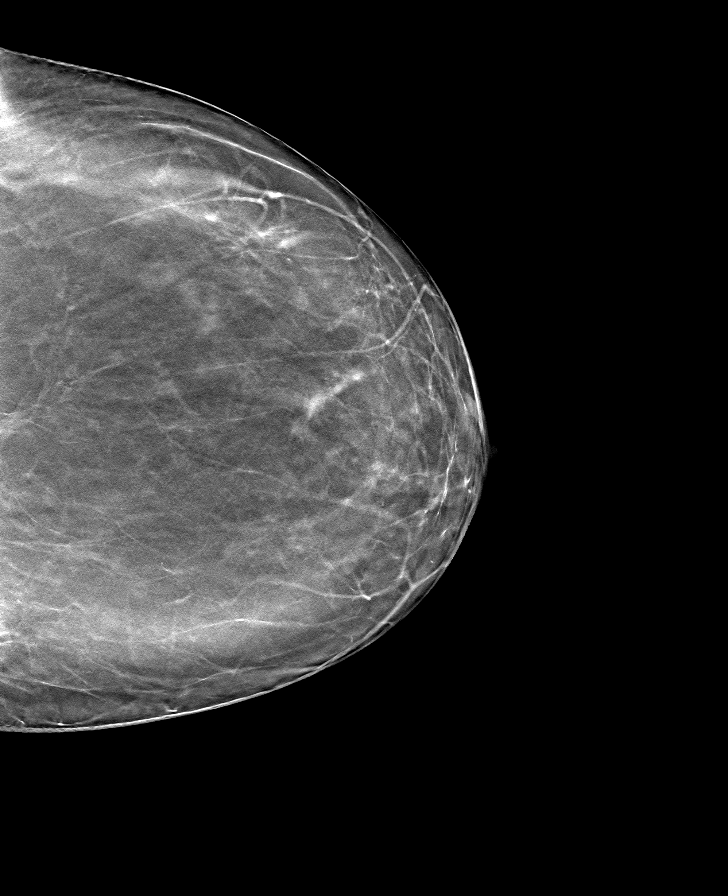

[L CC tomo (2 of 3) · tomo slice 41/81.0]
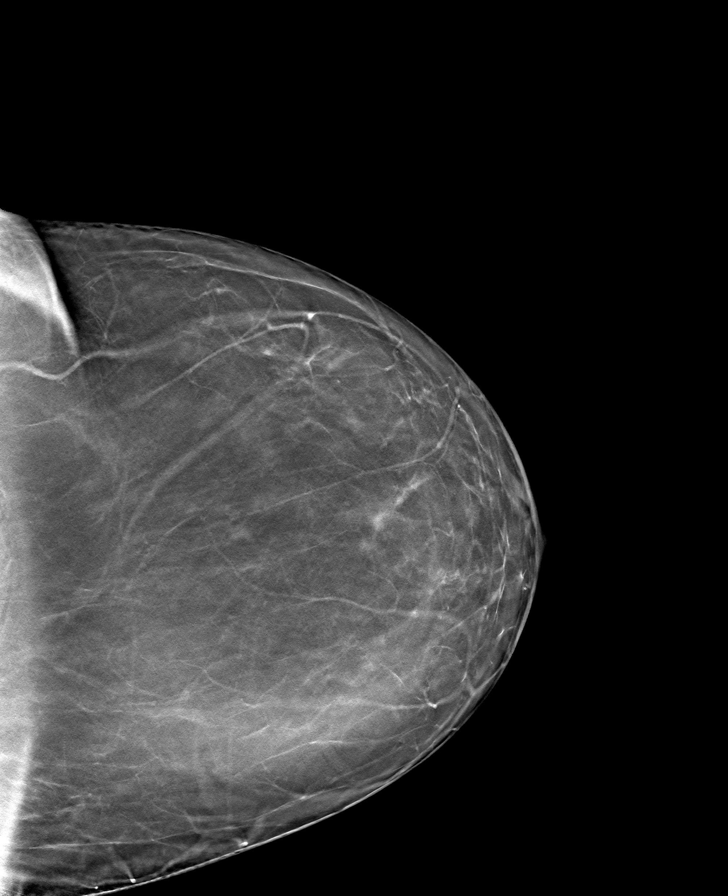

[L CC tomo (3 of 3) · tomo slice 39/77.0]
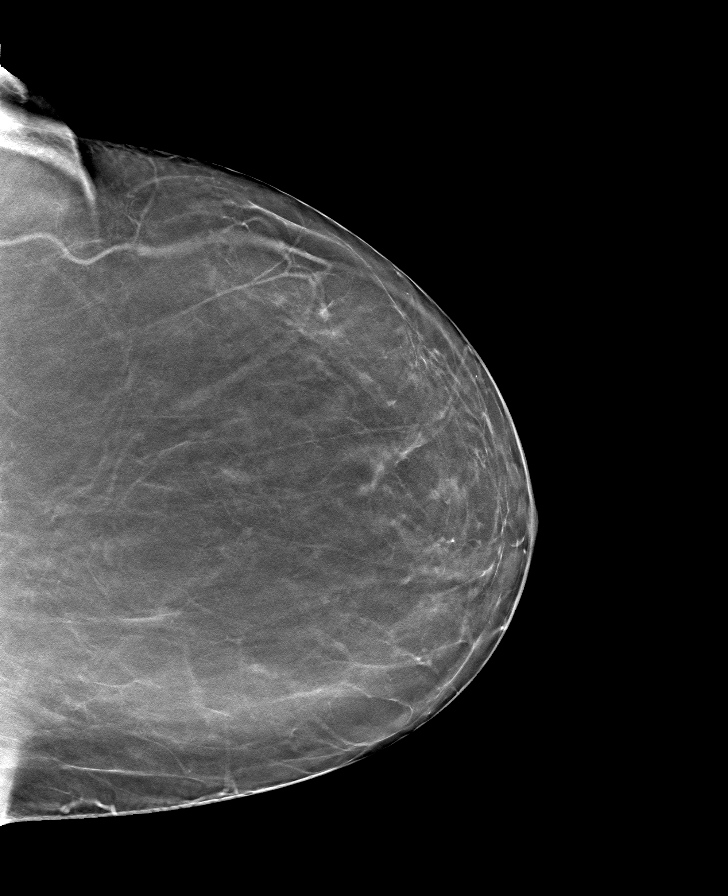

[8 of 24 positions shown; findings below may reference images not displayed]

ACR Breast Density Category b: There are scattered areas of
fibroglandular density.
FINDINGS: The small asymmetry in the central far posterior appears
mammographically stable. This can only be partially visualized on
the MLO view. No suspicious calcifications, masses or areas of
distortion are seen in the left breast.

Mammographic images were processed with CAD.
IMPRESSION: 1.  The left breast asymmetry is mammographically stable.

RECOMMENDATION:
Bilateral diagnostic mammogram and left breast ultrasound is
recommended in [DATE].

I have discussed the findings and recommendations with the patient.
Results were also provided in writing at the conclusion of the
visit. If applicable, a reminder letter will be sent to the patient
regarding the next appointment.

BI-RADS CATEGORY  3: Probably benign.

## 2017-08-28 NOTE — Assessment & Plan Note (Signed)
Concern for some underlying radicular symptoms.  Discussed icing regimen and home exercise.  Discussed which activities to do which wants to avoid.  Patient will increase activity as tolerated.  Follow-up again 6 weeks.

## 2017-08-28 NOTE — Patient Instructions (Signed)
Good to see you  I am glad you had a good trip  Ice is your friend PT in [place] will call you  See me again in 6 weeks and if not better we will discuss MRI of the back

## 2017-08-28 NOTE — Assessment & Plan Note (Signed)
Patient given an injection and tolerated the procedure well.  We discussed icing regimen and home exercises.  Discussed which activities of doing which was to avoid.  Topical anti-inflammatories given.  Differential includes a lumbar radiculopathy.  I do think that if continue to have trouble MRI will be necessary.

## 2017-09-04 DIAGNOSIS — M5137 Other intervertebral disc degeneration, lumbosacral region: Secondary | ICD-10-CM | POA: Diagnosis not present

## 2017-09-04 DIAGNOSIS — M5136 Other intervertebral disc degeneration, lumbar region: Secondary | ICD-10-CM | POA: Diagnosis not present

## 2017-09-04 DIAGNOSIS — M9904 Segmental and somatic dysfunction of sacral region: Secondary | ICD-10-CM | POA: Diagnosis not present

## 2017-09-04 DIAGNOSIS — M9901 Segmental and somatic dysfunction of cervical region: Secondary | ICD-10-CM | POA: Diagnosis not present

## 2017-09-04 DIAGNOSIS — M531 Cervicobrachial syndrome: Secondary | ICD-10-CM | POA: Diagnosis not present

## 2017-09-04 DIAGNOSIS — M9903 Segmental and somatic dysfunction of lumbar region: Secondary | ICD-10-CM | POA: Diagnosis not present

## 2017-09-05 ENCOUNTER — Encounter (INDEPENDENT_AMBULATORY_CARE_PROVIDER_SITE_OTHER): Payer: Self-pay

## 2017-09-11 DIAGNOSIS — M9903 Segmental and somatic dysfunction of lumbar region: Secondary | ICD-10-CM | POA: Diagnosis not present

## 2017-09-11 DIAGNOSIS — M5137 Other intervertebral disc degeneration, lumbosacral region: Secondary | ICD-10-CM | POA: Diagnosis not present

## 2017-09-11 DIAGNOSIS — M9904 Segmental and somatic dysfunction of sacral region: Secondary | ICD-10-CM | POA: Diagnosis not present

## 2017-09-11 DIAGNOSIS — M531 Cervicobrachial syndrome: Secondary | ICD-10-CM | POA: Diagnosis not present

## 2017-09-11 DIAGNOSIS — M9901 Segmental and somatic dysfunction of cervical region: Secondary | ICD-10-CM | POA: Diagnosis not present

## 2017-09-11 DIAGNOSIS — M5136 Other intervertebral disc degeneration, lumbar region: Secondary | ICD-10-CM | POA: Diagnosis not present

## 2017-09-12 ENCOUNTER — Ambulatory Visit: Payer: Medicare Other | Attending: Family Medicine | Admitting: Physical Therapy

## 2017-09-12 ENCOUNTER — Encounter: Payer: Self-pay | Admitting: Physical Therapy

## 2017-09-12 ENCOUNTER — Other Ambulatory Visit: Payer: Self-pay

## 2017-09-12 DIAGNOSIS — R29898 Other symptoms and signs involving the musculoskeletal system: Secondary | ICD-10-CM

## 2017-09-12 DIAGNOSIS — M545 Low back pain: Secondary | ICD-10-CM | POA: Diagnosis not present

## 2017-09-12 DIAGNOSIS — G8929 Other chronic pain: Secondary | ICD-10-CM | POA: Diagnosis not present

## 2017-09-12 DIAGNOSIS — M25552 Pain in left hip: Secondary | ICD-10-CM | POA: Insufficient documentation

## 2017-09-12 NOTE — Therapy (Signed)
[facility] [address]  [place], [place], [address] Phone: [phone]   Fax:  [phone]  Physical Therapy Evaluation  Patient Details  Name: Kelsey Church MRN: [id number] Date of Birth: Jan 14, 1951 Referring Provider: Hulan Saas, DO   Encounter Date: 09/12/2017  PT End of Session - 09/12/17 1148    Visit Number  1    Number of Visits  13    Date for PT Re-Evaluation  10/24/17    Authorization Type  Medicare + BCBS    PT Start Time  1100    PT Stop Time  1143    PT Time Calculation (min)  43 min    Activity Tolerance  Patient tolerated treatment well    Behavior During Therapy  [facility] for tasks assessed/performed       Past Medical History:  Diagnosis Date  . Arthritis    PAIN AND OA LEFT KNEE; S/P RIGHT TOTAL KNEE ARTHROPLASTY 06/10/13  . Cancer ([place])    melanoma in 2 sights  . Complication of anesthesia    "chills every evening at sundown x 2 weeks"- no fever  . Depression   . Dysphagia   . GERD (gastroesophageal reflux disease)   . History of shingles    NO RESIDUAL PROBLEMS  . Hypertension   . Melanoma in situ ([place])   . Pain    LOWER BACK PAIN  . Sleep apnea 09/2011   CPAP  . Sleep apnea     Past Surgical History:  Procedure Laterality Date  . APPENDECTOMY  1958  . COLONOSCOPY    . COLONOSCOPY W/ POLYPECTOMY     7 polyps  . FOOT SURGERY Right    removal plantar wart  . GALLBLADDER SURGERY  02/14/1990  . INJECTION KNEE  09/2011   right   . KNEE ARTHROSCOPY  1996  . MELANOMA EXCISION  1992   right chin, back  . POLYPECTOMY    . SALPINGOOPHORECTOMY  2009   Right  . TOTAL KNEE ARTHROPLASTY Right 06/10/2013   Procedure: RIGHT TOTAL KNEE ARTHROPLASTY;  Surgeon: [name], MD;  Location: [facility];  Service: Orthopedics;  Laterality: Right;  . TOTAL KNEE ARTHROPLASTY Left 12/16/2013   Procedure: LEFT TOTAL KNEE ARTHROPLASTY;  Surgeon: [name], MD;  Location: [facility];  Service:  Orthopedics;  Laterality: Left;    There were no vitals filed for this visit.   Subjective Assessment - 09/12/17 1103    Subjective  Patient reports L hip pain since 2015 with fluctuating symptoms. Recent flare up has been an issue for the past month after falling on L hip. Was [place] and [place]; resulted in hematoma. Treated with laser therapy at chiropractor and was resolved with heat and prednisone. Also had injection to R shoulder. Recently received injection to L hip bursa- pain 80% better. Aggravating factors: sleeping on L, prolonged walking. Also reports chronic L sided back pain of 3 years duration and recent worsening. Per pt- MD reports L hip pain may be coming from her back. Didn't have pain when she was exercising and doing water aerobics in the past and has recently started back up with these activities.     Pertinent History  L TKA, melanoma, dysphagia, GERD, hx shingles, HTN, LBP    Limitations  Walking;House hold activities    How long can you stand comfortably?  2-3 hours    How long can you walk  comfortably?  30-45 min    Diagnostic tests  none on hip    Patient Stated Goals  work on tone in my back and figure out factors to bursitis    Currently in Pain?  Yes    Pain Score  2     Pain Location  Hip    Pain Orientation  Left    Pain Descriptors / Indicators  Dull    Pain Type  Chronic pain    Aggravating Factors   Aggravating factors: sleeping on L, prolonged walking    Pain Relieving Factors  steroid injection         [facility] PT Assessment - 09/12/17 1117      Assessment   Medical Diagnosis  L hip pain    Referring Provider  Hulan Saas, DO    Onset Date/Surgical Date  08/13/17    Next MD Visit  10/10/17    Prior Therapy  Yes TKA      Precautions   Precautions  None      Restrictions   Weight Bearing Restrictions  No      Balance Screen   Has the patient fallen in the past 6 months  Yes    How many times?  1    Has the  patient had a decrease in activity level because of a fear of falling?   No    Is the patient reluctant to leave their home because of a fear of falling?   No      Home Environment   Living Environment  Private residence    Living Arrangements  Alone    Available Help at Discharge  Family;Friend(s);Neighbor    Type of [place] Access  Level entry    [place]  One level      Prior Function   Level of Independence  Independent    Vocation  Retired    Garment/textile technologist and exercise      Cognition   Overall Cognitive Status  Within Functional Limits for tasks assessed      Observation/Other Assessments   Focus on Therapeutic Outcomes (FOTO)   Hip: 60 (40% limited, 36% predicted)      Sensation   Light Touch  Appears Intact      Coordination   Gross Motor Movements are Fluid and Coordinated  Yes      Posture/Postural Control   Posture/Postural Control  Postural limitations    Postural Limitations  Rounded Shoulders;Forward head      ROM / Strength   AROM / PROM / Strength  AROM;Strength      AROM   AROM Assessment Site  Lumbar;Hip    Right/Left Hip  Left;Right    Right Hip External Rotation   45    Right Hip Internal Rotation   40    Left Hip External Rotation   36    Left Hip Internal Rotation   21 pain L LB    Lumbar Flexion  distal shin    Lumbar Extension  mildly limited twinge of L buttock pain    Lumbar - Right Side Bend  jt line 1/10 L LBP    Lumbar - Left Side Bend  jt line 1/10 pain    Lumbar - Right Rotation  WFL 1/10 L LBP    Lumbar - Left Rotation  [facility]      Strength   Strength Assessment Site  Hip;Knee;Ankle    Right/Left Hip  Right;Left    Right Hip Flexion  4+/5    Right Hip ABduction  4+/5    Right Hip ADduction  4+/5    Left Hip Flexion  4+/5    Left Hip ABduction  4+/5    Left Hip ADduction  4+/5    Right/Left Knee  Right;Left    Right Knee Flexion  4+/5    Right Knee Extension  4+/5    Left Knee Flexion  4+/5    Left Knee  Extension  4+/5    Right/Left Ankle  Right;Left    Right Ankle Dorsiflexion  4+/5    Right Ankle Plantar Flexion  4+/5    Left Ankle Dorsiflexion  4+/5    Left Ankle Plantar Flexion  4+/5      Palpation   Palpation comment  TTP in L lateral glute, L lumbar paraspinals, L superior glute, piriformis                Objective measurements completed on examination: See above findings.              PT Education - 09/12/17 1148    Education Details  prognosis, POC, HEP    Person(s) Educated  Patient    Methods  Explanation;Demonstration;Tactile cues;Verbal cues;Handout    Comprehension  Returned demonstration;Verbalized understanding       PT Short Term Goals - 09/12/17 1155      PT SHORT TERM GOAL #1   Title  Patient to demonstrate independence with initial HEP.    Time  3    Period  Weeks    Status  New    Target Date  10/03/17        PT Long Term Goals - 09/12/17 1156      PT LONG TERM GOAL #1   Title  Patient to be independent with advanced HEP.    Time  6    Period  Weeks    Status  New    Target Date  10/24/17      PT LONG TERM GOAL #2   Title  Patient to demonstrate [facility] and pain-free lumbar AROM.    Time  6    Period  Weeks    Status  New    Target Date  10/24/17      PT LONG TERM GOAL #3   Title  Patient to report tolerance of 6 hours of L sidelying during sleep without pain limiting.     Time  6    Period  Weeks    Status  New    Target Date  10/24/17      PT LONG TERM GOAL #4   Title  Patient to report tolerance of 1.5 hours of walking without pain limiting.     Time  6    Period  Weeks    Status  New    Target Date  10/24/17             Plan - 09/12/17 1149    Clinical Impression Statement  Patient is a 67y/o F presenting to OPPT with c/o L hip and LBP pain. Reports these are chronic symptoms with recent exacerbation of 1 month duration after falling on L hip at movie theatre. Aggravating factors include prolonged  walking and L sidelying. Patient today with excellent B LE strength, limited L hip IR, painful lumbar ROM, and significant tenderness in L lumbar paraspinals, glute, and piriformis. Eductaed on and received HEP handout for gentle ROM and  stretching. Advised not to push into pain. Patient reported understanding. Would benefit from skilled PT services 2x/week for 6 weeks to address aforementioned impairments.     Clinical Presentation  Stable    Clinical Decision Making  Low    Rehab Potential  Good    Clinical Impairments Affecting Rehab Potential  B TKA, melanoma, dysphagia, GERD, hx shingles, HTN, LBP    PT Frequency  2x / week    PT Duration  6 weeks    PT Treatment/Interventions  ADLs/Self Care Home Management;Cryotherapy;Iontophoresis 4mg /ml Dexamethasone;Moist Heat;Ultrasound;Traction;Gait training;Stair training;Functional mobility training;Therapeutic activities;Therapeutic exercise;Manual techniques;Patient/family education;Balance training;Neuromuscular re-education;Passive range of motion;Dry needling;Energy conservation;Splinting;Taping    PT Next Visit Plan  reassess HEP, L glute and piriformis STM    Consulted and Agree with Plan of Care  Patient       Patient will benefit from skilled therapeutic intervention in order to improve the following deficits and impairments:  Decreased activity tolerance, Pain, Decreased mobility, Decreased balance, Difficulty walking, Decreased range of motion, Postural dysfunction  Visit Diagnosis: Chronic left-sided low back pain, with sciatica presence unspecified  Pain in left hip  Other symptoms and signs involving the musculoskeletal system     Problem List Patient Active Problem List   Diagnosis Date Noted  . Greater trochanteric bursitis of left hip 08/28/2017  . Contusion of left hip 08/02/2017  . Need for hepatitis C screening test 04/17/2017  . Degenerative lumbar disc 11/01/2016  . 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 Nonspecific  abnormal electrocardiogram (ECG) (EKG) 08/27/2015  . BPPV (benign paroxysmal positional vertigo) 08/27/2015  . Gastroesophageal reflux disease with esophagitis 12/10/2014  . Moderate osteopenia 12/10/2014  . Vitamin B12 deficiency 12/10/2014  . OA (osteoarthritis) of knee 12/16/2013  . Prediabetes 12/03/2013  . Bursitis of right shoulder 10/04/2013  . Other screening mammogram 09/25/2012  . DJD (degenerative joint disease) of knee 09/09/2011  . OSA (obstructive sleep apnea) 07/29/2011  . Essential hypertension, benign 07/29/2011  . DOE (dyspnea on exertion) 07/29/2011  . Pure hypercholesterolemia 07/29/2011  . Routine general medical examination at a health care facility 07/29/2011    Janene Harvey, PT, DPT 09/12/17 12:00 PM   [facility] [address]  [place], [place], [address] Phone: [phone]   Fax:  [phone]  Name: Kelsey Church MRN: [id number] Date of Birth: 1950/08/03

## 2017-09-14 ENCOUNTER — Other Ambulatory Visit: Payer: Self-pay

## 2017-09-15 ENCOUNTER — Other Ambulatory Visit: Payer: Self-pay | Admitting: Internal Medicine

## 2017-09-18 DIAGNOSIS — M9901 Segmental and somatic dysfunction of cervical region: Secondary | ICD-10-CM | POA: Diagnosis not present

## 2017-09-18 DIAGNOSIS — M9904 Segmental and somatic dysfunction of sacral region: Secondary | ICD-10-CM | POA: Diagnosis not present

## 2017-09-18 DIAGNOSIS — M531 Cervicobrachial syndrome: Secondary | ICD-10-CM | POA: Diagnosis not present

## 2017-09-18 DIAGNOSIS — M5137 Other intervertebral disc degeneration, lumbosacral region: Secondary | ICD-10-CM | POA: Diagnosis not present

## 2017-09-18 DIAGNOSIS — M9903 Segmental and somatic dysfunction of lumbar region: Secondary | ICD-10-CM | POA: Diagnosis not present

## 2017-09-18 DIAGNOSIS — M5136 Other intervertebral disc degeneration, lumbar region: Secondary | ICD-10-CM | POA: Diagnosis not present

## 2017-09-19 ENCOUNTER — Telehealth: Payer: Self-pay | Admitting: *Deleted

## 2017-09-19 ENCOUNTER — Ambulatory Visit: Payer: Medicare Other | Attending: Family Medicine

## 2017-09-19 DIAGNOSIS — G8929 Other chronic pain: Secondary | ICD-10-CM

## 2017-09-19 DIAGNOSIS — D219 Benign neoplasm of connective and other soft tissue, unspecified: Secondary | ICD-10-CM

## 2017-09-19 DIAGNOSIS — M25552 Pain in left hip: Secondary | ICD-10-CM | POA: Diagnosis not present

## 2017-09-19 DIAGNOSIS — M545 Low back pain: Secondary | ICD-10-CM | POA: Diagnosis not present

## 2017-09-19 DIAGNOSIS — R29898 Other symptoms and signs involving the musculoskeletal system: Secondary | ICD-10-CM

## 2017-09-19 NOTE — Therapy (Signed)
[facility] [address]  [place], [place], [address] Phone: [phone]   Fax:  [phone]  Physical Therapy Treatment  Patient Details  Name: Kelsey Church MRN: [id number] Date of Birth: 1950/03/17 Referring Provider: Hulan Saas, DO   Encounter Date: 09/19/2017  PT End of Session - 09/19/17 1405    Visit Number  2    Number of Visits  13    Date for PT Re-Evaluation  10/24/17    Authorization Type  Medicare + BCBS    PT Start Time  1400    PT Stop Time  1445    PT Time Calculation (min)  45 min    Activity Tolerance  Patient tolerated treatment well    Behavior During Therapy  [facility] for tasks assessed/performed       Past Medical History:  Diagnosis Date  . Arthritis    PAIN AND OA LEFT KNEE; S/P RIGHT TOTAL KNEE ARTHROPLASTY 06/10/13  . Cancer ([place])    melanoma in 2 sights  . Complication of anesthesia    "chills every evening at sundown x 2 weeks"- no fever  . Depression   . Dysphagia   . GERD (gastroesophageal reflux disease)   . History of shingles    NO RESIDUAL PROBLEMS  . Hypertension   . Melanoma in situ ([place])   . Pain    LOWER BACK PAIN  . Sleep apnea 09/2011   CPAP  . Sleep apnea     Past Surgical History:  Procedure Laterality Date  . APPENDECTOMY  1958  . COLONOSCOPY    . COLONOSCOPY W/ POLYPECTOMY     7 polyps  . FOOT SURGERY Right    removal plantar wart  . GALLBLADDER SURGERY  02/14/1990  . INJECTION KNEE  09/2011   right   . KNEE ARTHROSCOPY  1996  . MELANOMA EXCISION  1992   right chin, back  . POLYPECTOMY    . SALPINGOOPHORECTOMY  2009   Right  . TOTAL KNEE ARTHROPLASTY Right 06/10/2013   Procedure: RIGHT TOTAL KNEE ARTHROPLASTY;  Surgeon: [name], MD;  Location: [facility];  Service: Orthopedics;  Laterality: Right;  . TOTAL KNEE ARTHROPLASTY Left 12/16/2013   Procedure: LEFT TOTAL KNEE ARTHROPLASTY;  Surgeon: [name], MD;  Location: [facility];  Service:  Orthopedics;  Laterality: Left;    There were no vitals filed for this visit.  Subjective Assessment - 09/19/17 1405    Subjective  Pt. doing well today noting L hip pain is infrequent     Pertinent History  L TKA, melanoma, dysphagia, GERD, hx shingles, HTN, LBP    Patient Stated Goals  work on tone in my back and figure out factors to bursitis    Currently in Pain?  No/denies    Pain Score  0-No pain    Multiple Pain Sites  No                       [facility] Adult PT Treatment/Exercise - 09/19/17 1417      Self-Care   Self-Care  Other Self-Care Comments    Other Self-Care Comments   L glute self-release with ball on wall       Lumbar Exercises: Stretches   Single Knee to Chest Stretch  Right;Left;1 rep;30 seconds    Lower Trunk Rotation  5 reps;10 seconds    Lower Trunk Rotation Limitations  cues for proper motion  Knee/Hip Exercises: Stretches   Piriformis Stretch  Right;Left;1 rep;30 seconds    Piriformis Stretch Limitations  KTOS      Knee/Hip Exercises: Aerobic   Recumbent Bike  Lvl 2, 6 min       Knee/Hip Exercises: Seated   Other Seated Knee/Hip Exercises  B fitter leg press (1 blue, 1 black band) x 15 reps each way       Knee/Hip Exercises: Supine   Bridges with Clamshell  Both;10 reps;Strengthening with red looped TB at knees     Straight Leg Raises  Left;10 reps;Strengthening    Straight Leg Raises Limitations  opposite LE bent       Manual Therapy   Manual Therapy  Soft tissue mobilization    Manual therapy comments  sidelying     Soft tissue mobilization  STM to L glute/piriformis in area of tenderness              PT Education - 09/19/17 1456    Education Details  HEP update     Person(s) Educated  Patient    Methods  Explanation;Verbal cues;Demonstration;Handout    Comprehension  Verbalized understanding;Returned demonstration;Verbal cues required;Need further instruction       PT Short Term Goals - 09/19/17 1407      PT  SHORT TERM GOAL #1   Title  Patient to demonstrate independence with initial HEP.    Time  3    Period  Weeks    Status  On-going        PT Long Term Goals - 09/19/17 1407      PT LONG TERM GOAL #1   Title  Patient to be independent with advanced HEP.    Time  6    Period  Weeks    Status  On-going      PT LONG TERM GOAL #2   Title  Patient to demonstrate [facility] and pain-free lumbar AROM.    Time  6    Period  Weeks    Status  On-going      PT LONG TERM GOAL #3   Title  Patient to report tolerance of 6 hours of L sidelying during sleep without pain limiting.     Time  6    Period  Weeks    Status  On-going      PT LONG TERM GOAL #4   Title  Patient to report tolerance of 1.5 hours of walking without pain limiting.     Time  6    Period  Weeks    Status  On-going            Plan - 09/19/17 1407    Clinical Impression Statement  Kelsey Church reporting no issues with HEP however did require mod cueing with LTR and piriformis stretches for correct performance.  Did have continued L glute/piriformis tenderness, which responded well to STM thus issued glute self-ball release to pt. via handout to add to HEP.  Ended session pain free thus modalities deferred.  Will monitor response to today's therex progression and continue per pt. in coming visits.      Clinical Impairments Affecting Rehab Potential  B TKA, melanoma, dysphagia, GERD, hx shingles, HTN, LBP    PT Treatment/Interventions  ADLs/Self Care Home Management;Cryotherapy;Iontophoresis 4mg /ml Dexamethasone;Moist Heat;Ultrasound;Traction;Gait training;Stair training;Functional mobility training;Therapeutic activities;Therapeutic exercise;Manual techniques;Patient/family education;Balance training;Neuromuscular re-education;Passive range of motion;Dry needling;Energy conservation;Splinting;Taping    Consulted and Agree with Plan of Care  Patient       Patient will benefit  from skilled therapeutic intervention in order to improve  the following deficits and impairments:  Decreased activity tolerance, Pain, Decreased mobility, Decreased balance, Difficulty walking, Decreased range of motion, Postural dysfunction  Visit Diagnosis: Chronic left-sided low back pain, with sciatica presence unspecified  Pain in left hip  Other symptoms and signs involving the musculoskeletal system     Problem List Patient Active Problem List   Diagnosis Date Noted  . Greater trochanteric bursitis of left hip 08/28/2017  . Contusion of left hip 08/02/2017  . Need for hepatitis C screening test 04/17/2017  . Degenerative lumbar disc 11/01/2016  . 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 Nonspecific abnormal electrocardiogram (ECG) (EKG) 08/27/2015  . BPPV (benign paroxysmal positional vertigo) 08/27/2015  . Gastroesophageal reflux disease with esophagitis 12/10/2014  . Moderate osteopenia 12/10/2014  . Vitamin B12 deficiency 12/10/2014  . OA (osteoarthritis) of knee 12/16/2013  . Prediabetes 12/03/2013  . Bursitis of right shoulder 10/04/2013  . Other screening mammogram 09/25/2012  . DJD (degenerative joint disease) of knee 09/09/2011  . OSA (obstructive sleep apnea) 07/29/2011  . Essential hypertension, benign 07/29/2011  . DOE (dyspnea on exertion) 07/29/2011  . Pure hypercholesterolemia 07/29/2011  . Routine general medical examination at a health care facility 07/29/2011    Bess Harvest, PTA 09/19/17 3:10 PM   [facility] [address]  [place], [place], [address] Phone: [phone]   Fax:  [phone]  Name: Kelsey Church MRN: [id number] Date of Birth: 02-05-1951

## 2017-09-19 NOTE — Telephone Encounter (Signed)
Patient called stating she had X-ray done by orthopedic due to back pain, x- ray noted fibroid. I asked patient to fax me a copy of report so I can give to Dr. Dellis Filbert to review for recommendations. I gave her fax # to put to my attention.

## 2017-09-20 NOTE — Telephone Encounter (Signed)
Order placed, unable to relay to patient her phone did not have voicemail set up. Will try later.

## 2017-09-20 NOTE — Telephone Encounter (Signed)
Schedule patient for a Pelvic US with me to assess.  In the meantime, reassure that if she has Fibroids, usually not a problem after menopause.

## 2017-09-20 NOTE — Telephone Encounter (Signed)
Dr.Lavoie paper copy of x-ray report on your desk abnormal finding of possible uterine fibroids. Patient would like to know how to proceed with this? Please advise

## 2017-09-21 NOTE — Telephone Encounter (Signed)
Spoke with patient order placed,. I told her to call when she can to schedule. Patient was driving.

## 2017-09-22 ENCOUNTER — Ambulatory Visit: Payer: Medicare Other | Admitting: Physical Therapy

## 2017-09-22 DIAGNOSIS — R29898 Other symptoms and signs involving the musculoskeletal system: Secondary | ICD-10-CM | POA: Diagnosis not present

## 2017-09-22 DIAGNOSIS — M25552 Pain in left hip: Secondary | ICD-10-CM

## 2017-09-22 DIAGNOSIS — G8929 Other chronic pain: Secondary | ICD-10-CM | POA: Diagnosis not present

## 2017-09-22 DIAGNOSIS — M545 Low back pain: Principal | ICD-10-CM

## 2017-09-22 NOTE — Therapy (Signed)
[facility] [address]  [place], [place], [address] Phone: [phone]   Fax:  [phone]  Physical Therapy Treatment  Patient Details  Name: Kelsey Church MRN: [id number] Date of Birth: 11/10/1950 Referring Provider: Hulan Saas, DO   Encounter Date: 09/22/2017  PT End of Session - 09/22/17 1153    Visit Number  3    Number of Visits  13    Date for PT Re-Evaluation  10/24/17    Authorization Type  Medicare + BCBS    PT Start Time  1100    PT Stop Time  1149    PT Time Calculation (min)  49 min    Activity Tolerance  Patient tolerated treatment well;Patient limited by pain    Behavior During Therapy  [facility] for tasks assessed/performed       Past Medical History:  Diagnosis Date  . Arthritis    PAIN AND OA LEFT KNEE; S/P RIGHT TOTAL KNEE ARTHROPLASTY 06/10/13  . Cancer ([place])    melanoma in 2 sights  . Complication of anesthesia    "chills every evening at sundown x 2 weeks"- no fever  . Depression   . Dysphagia   . GERD (gastroesophageal reflux disease)   . History of shingles    NO RESIDUAL PROBLEMS  . Hypertension   . Melanoma in situ ([place])   . Pain    LOWER BACK PAIN  . Sleep apnea 09/2011   CPAP  . Sleep apnea     Past Surgical History:  Procedure Laterality Date  . APPENDECTOMY  1958  . COLONOSCOPY    . COLONOSCOPY W/ POLYPECTOMY     7 polyps  . FOOT SURGERY Right    removal plantar wart  . GALLBLADDER SURGERY  02/14/1990  . INJECTION KNEE  09/2011   right   . KNEE ARTHROSCOPY  1996  . MELANOMA EXCISION  1992   right chin, back  . POLYPECTOMY    . SALPINGOOPHORECTOMY  2009   Right  . TOTAL KNEE ARTHROPLASTY Right 06/10/2013   Procedure: RIGHT TOTAL KNEE ARTHROPLASTY;  Surgeon: [name], MD;  [facility];  Service: Orthopedics;  Laterality: Right;  . TOTAL KNEE ARTHROPLASTY Left 12/16/2013   Procedure: LEFT TOTAL KNEE ARTHROPLASTY;  Surgeon: [name], MD;   [facility];  Service: Orthopedics;  Laterality: Left;    There were no vitals filed for this visit.  Subjective Assessment - 09/22/17 1100    Subjective  Patient reports back pain is not as noticable but still there at times. Has been doing HEP on floor and has been working well. Had an episode of popping in back once but it felt good. Lside feels tight ad the day goes on and worse at night..    Pertinent History  L TKA, melanoma, dysphagia, GERD, hx shingles, HTN, LBP    Diagnostic tests  none on hip    Patient Stated Goals  work on tone in my back and figure out factors to bursitis    Currently in Pain?  No/denies                       [facility] Adult PT Treatment/Exercise - 09/22/17 0001      Exercises   Exercises  Knee/Hip;Lumbar      Lumbar Exercises: Supine   Ab Set  5 reps;Limitations    AB Set Limitations  5x10" TrA contraction    heavy  instruction and TCs     Knee/Hip Exercises: Stretches   ITB Stretch  Left;2 reps;30 seconds    ITB Stretch Limitations  supine strap; pt noting no stretch    Piriformis Stretch  Right;Left;30 seconds;3 reps    Piriformis Stretch Limitations  KTOS    Other Knee/Hip Stretches  L figure 4 stretch 2x30 sec   pt noting discomfort in L knee     Knee/Hip Exercises: Aerobic   Nustep  L3x 45min      Knee/Hip Exercises: Supine   Bridges with Clamshell  Both;10 reps;Strengthening;Limitations   red TB around knees     Knee/Hip Exercises: Sidelying   Hip ABduction  Strengthening;Right;Left;1 set;10 reps;Limitations   required pillow underneath L hip    Hip ABduction Limitations  VC/TCs to maintain alignment    Hip ADduction  Strengthening;Right;Left;1 set;10 reps;Limitations   tolerated pillow underneath L hip   Hip ADduction Limitations  VCs for form      Manual Therapy   Manual Therapy  Soft tissue mobilization    Manual therapy comments  sidelying. supine    Soft tissue mobilization  L piriformis, superior and lateral  glute, midlength of ITB- extremely tender throughout, trigger pts noted along glute and ITB               PT Short Term Goals - 09/22/17 1154      PT SHORT TERM GOAL #1   Title  Patient to demonstrate independence with initial HEP.    Time  3    Period  Weeks    Status  Achieved        PT Long Term Goals - 09/19/17 1407      PT LONG TERM GOAL #1   Title  Patient to be independent with advanced HEP.    Time  6    Period  Weeks    Status  On-going      PT LONG TERM GOAL #2   Title  Patient to demonstrate [facility] and pain-free lumbar AROM.    Time  6    Period  Weeks    Status  On-going      PT LONG TERM GOAL #3   Title  Patient to report tolerance of 6 hours of L sidelying during sleep without pain limiting.     Time  6    Period  Weeks    Status  On-going      PT LONG TERM GOAL #4   Title  Patient to report tolerance of 1.5 hours of walking without pain limiting.     Time  6    Period  Weeks    Status  On-going            Plan - 09/22/17 1154    Clinical Impression Statement  Patient arrived to session with report of improvement in LBP since last session. Able to complete HEP and noted benefit. Patient with report of tightness in L buttock. Focused session on manual therapy to relieve "tightness." Patient tolerated STM to L piriformis, superior and lateral glute, mid-length of ITB. Patient extremely tender throughout and with trigger points palpated along glute and ITB. Report of relief following manual therapy. Worked on stretching L glute- patient reporting most benefit from KTOS stretch. Required pillow under L hip for sidelying hip abduction and adduction d/t pain with pressure on this area but able to tolerate after heavy instruction for alignment. Patient with report of improvement in symptoms at end of session.  Clinical Impairments Affecting Rehab Potential  B TKA, melanoma, dysphagia, GERD, hx shingles, HTN, LBP    PT Treatment/Interventions   ADLs/Self Care Home Management;Cryotherapy;Iontophoresis 4mg /ml Dexamethasone;Moist Heat;Ultrasound;Traction;Gait training;Stair training;Functional mobility training;Therapeutic activities;Therapeutic exercise;Manual techniques;Patient/family education;Balance training;Neuromuscular re-education;Passive range of motion;Dry needling;Energy conservation;Splinting;Taping    PT Next Visit Plan  STM to L ITB and glute    Consulted and Agree with Plan of Care  Patient       Patient will benefit from skilled therapeutic intervention in order to improve the following deficits and impairments:  Decreased activity tolerance, Pain, Decreased mobility, Decreased balance, Difficulty walking, Decreased range of motion, Postural dysfunction  Visit Diagnosis: Chronic left-sided low back pain, with sciatica presence unspecified  Pain in left hip  Other symptoms and signs involving the musculoskeletal system     Problem List Patient Active Problem List   Diagnosis Date Noted  . Greater trochanteric bursitis of left hip 08/28/2017  . Contusion of left hip 08/02/2017  . Need for hepatitis C screening test 04/17/2017  . Degenerative lumbar disc 11/01/2016  . 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 Nonspecific abnormal electrocardiogram (ECG) (EKG) 08/27/2015  . BPPV (benign paroxysmal positional vertigo) 08/27/2015  . Gastroesophageal reflux disease with esophagitis 12/10/2014  . Moderate osteopenia 12/10/2014  . Vitamin B12 deficiency 12/10/2014  . OA (osteoarthritis) of knee 12/16/2013  . Prediabetes 12/03/2013  . Bursitis of right shoulder 10/04/2013  . Other screening mammogram 09/25/2012  . DJD (degenerative joint disease) of knee 09/09/2011  . OSA (obstructive sleep apnea) 07/29/2011  . Essential hypertension, benign 07/29/2011  . DOE (dyspnea on exertion) 07/29/2011  . Pure hypercholesterolemia 07/29/2011  . Routine general medical examination at a health care facility 07/29/2011   Janene Harvey, PT, DPT 09/22/17 11:55 AM   [facility] [address]  [place], [place], [address] Phone: [phone]   Fax:  [phone]  Name: Kelsey Church MRN: [id number] Date of Birth: Apr 23, 1950

## 2017-09-26 ENCOUNTER — Ambulatory Visit: Payer: Medicare Other

## 2017-09-26 DIAGNOSIS — M545 Low back pain: Principal | ICD-10-CM

## 2017-09-26 DIAGNOSIS — M25552 Pain in left hip: Secondary | ICD-10-CM | POA: Diagnosis not present

## 2017-09-26 DIAGNOSIS — G8929 Other chronic pain: Secondary | ICD-10-CM | POA: Diagnosis not present

## 2017-09-26 DIAGNOSIS — R29898 Other symptoms and signs involving the musculoskeletal system: Secondary | ICD-10-CM

## 2017-09-26 NOTE — Therapy (Signed)
Kel[facility] Church [address]  [place], [place], [address] Phone: [phone]   Fax:  [phone]  Physical Therapy Treatment  Patient Details  Name: Kelsey Church MRN: [id number] Date of Birth: 1950-05-14 Referring Provider: Hulan Saas, DO   Encounter Date: 09/26/2017  PT End of Session - 09/26/17 1105    Visit Number  4    Number of Visits  13    Date for PT Re-Evaluation  10/24/17    Authorization Type  Medicare + BCBS    PT Start Time  1100    PT Stop Time  1150    PT Time Calculation (min)  50 min    Activity Tolerance  Patient tolerated treatment well    Behavior During Therapy  [facility] for tasks assessed/performed       Past Medical History:  Diagnosis Date  . Arthritis    PAIN AND OA LEFT KNEE; S/P RIGHT TOTAL KNEE ARTHROPLASTY 06/10/13  . Cancer ([place])    melanoma in 2 sights  . Complication of anesthesia    "chills every evening at sundown x 2 weeks"- no fever  . Depression   . Dysphagia   . GERD (gastroesophageal reflux disease)   . History of shingles    NO RESIDUAL PROBLEMS  . Hypertension   . Melanoma in situ ([place] [facility])   . Pain    LOWER BACK PAIN  . Sleep apnea 09/2011   CPAP  . Sleep apnea     Past Surgical History:  Procedure Laterality Date  . APPENDECTOMY  1958  . COLONOSCOPY    . COLONOSCOPY W/ POLYPECTOMY     7 polyps  . FOOT SURGERY Right    removal plantar wart  . GALLBLADDER SURGERY  02/14/1990  . INJECTION KNEE  09/2011   right   . KNEE ARTHROSCOPY  1996  . MELANOMA EXCISION  1992   right chin, back  . POLYPECTOMY    . SALPINGOOPHORECTOMY  2009   Right  . TOTAL KNEE ARTHROPLASTY Right 06/10/2013   Procedure: RIGHT TOTAL KNEE ARTHROPLASTY;  Surgeon: [name], MD;  [facility];  Service: Orthopedics;  Laterality: Right;  . TOTAL KNEE ARTHROPLASTY Left 12/16/2013   Procedure: LEFT TOTAL KNEE ARTHROPLASTY;  Surgeon: [name], MD;  [facility];  Service:  Orthopedics;  Laterality: Left;    There were no vitals filed for this visit.  Subjective Assessment - 09/26/17 1109    Subjective  Pt. noting some L LE soreness following last visit which lasted into Sunday.     Pertinent History  L TKA, melanoma, dysphagia, GERD, hx shingles, HTN, LBP    Patient Stated Goals  work on tone in my back and figure out factors to bursitis    Currently in Pain?  Yes    Pain Score  3     Pain Location  Buttocks    Pain Orientation  Left    Pain Descriptors / Indicators  Dull    Pain Type  Chronic pain    Multiple Pain Sites  No                       [facility] Adult PT Treatment/Exercise - 09/26/17 1126      Lumbar Exercises: Stretches   Single Knee to Chest Stretch  Right;Left;30 seconds    Single Knee to Chest Stretch Limitations  towel     Hip Flexor Stretch  Left;30 seconds;1  rep      Lumbar Exercises: Supine   Clam  15 reps;3 seconds    Clam Limitations  Hooklying with red TB at knees + abdom. bracing       Knee/Hip Exercises: Stretches   Piriformis Stretch  Left;30 seconds;3 reps      Knee/Hip Exercises: Aerobic   Recumbent Bike  Lvl 2, 7 min       Knee/Hip Exercises: Supine   Bridges with Clamshell  Both;Strengthening;Limitations;15 reps   green TB at knees      Manual Therapy   Manual Therapy  Soft tissue mobilization;Passive ROM    Manual therapy comments  sidelying. supine    Soft tissue mobilization  L piriformis, superior glute, L ITB/lateral quad, L ITB/lateral HS; ttp throughout     Passive ROM  L ITB stretch in sidelying, L mod thomas stretch with therapist overpressure x 30 sec each             PT Education - 09/26/17 1201    Education Details  HEP update     Person(s) Educated  Patient    Methods  Explanation;Demonstration;Verbal cues;Handout    Comprehension  Verbalized understanding;Returned demonstration;Verbal cues required;Need further instruction       PT Short Term Goals - 09/22/17 1154       PT SHORT TERM GOAL #1   Title  Patient to demonstrate independence with initial HEP.    Time  3    Period  Weeks    Status  Achieved        PT Long Term Goals - 09/19/17 1407      PT LONG TERM GOAL #1   Title  Patient to be independent with advanced HEP.    Time  6    Period  Weeks    Status  On-going      PT LONG TERM GOAL #2   Title  Patient to demonstrate [facility] and pain-free lumbar AROM.    Time  6    Period  Weeks    Status  On-going      PT LONG TERM GOAL #3   Title  Patient to report tolerance of 6 hours of L sidelying during sleep without pain limiting.     Time  6    Period  Weeks    Status  On-going      PT LONG TERM GOAL #4   Title  Patient to report tolerance of 1.5 hours of walking without pain limiting.     Time  6    Period  Weeks    Status  On-going            Plan - 09/26/17 1107    Clinical Impression Statement  [name] reporting a few days of soreness following last visit, which she attributes to manual massage to L buttocks and lateral thigh.  With palpable revealed ongoing ttp and tightness in L lateral hip/thigh musculature, which was addressed with STM/TPR with good relief, noted.  Lumbopelvic strengthening therex somewhat progressed today with pt. tolerating well with update of HEP and red TB issued to pt.  Will monitor response at upcoming visit.      Clinical Impairments Affecting Rehab Potential  B TKA, melanoma, dysphagia, GERD, hx shingles, HTN, LBP    PT Treatment/Interventions  ADLs/Self Care Home Management;Cryotherapy;Iontophoresis 4mg /ml Dexamethasone;Moist Heat;Ultrasound;Traction;Gait training;Stair training;Functional mobility training;Therapeutic activities;Therapeutic exercise;Manual techniques;Patient/family education;Balance training;Neuromuscular re-education;Passive range of motion;Dry needling;Energy conservation;Splinting;Taping    Consulted and Agree with Plan of Care  Patient  Patient will benefit from skilled therapeutic  intervention in order to improve the following deficits and impairments:  Decreased activity tolerance, Pain, Decreased mobility, Decreased balance, Difficulty walking, Decreased range of motion, Postural dysfunction  Visit Diagnosis: Chronic left-sided low back pain, with sciatica presence unspecified  Pain in left hip  Other symptoms and signs involving the musculoskeletal system     Problem List Patient Active Problem List   Diagnosis Date Noted  . Greater trochanteric bursitis of left hip 08/28/2017  . Contusion of left hip 08/02/2017  . Need for hepatitis C screening test 04/17/2017  . Degenerative lumbar disc 11/01/2016  . 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 Nonspecific abnormal electrocardiogram (ECG) (EKG) 08/27/2015  . BPPV (benign paroxysmal positional vertigo) 08/27/2015  . Gastroesophageal reflux disease with esophagitis 12/10/2014  . Moderate osteopenia 12/10/2014  . Vitamin B12 deficiency 12/10/2014  . OA (osteoarthritis) of knee 12/16/2013  . Prediabetes 12/03/2013  . Bursitis of right shoulder 10/04/2013  . Other screening mammogram 09/25/2012  . DJD (degenerative joint disease) of knee 09/09/2011  . OSA (obstructive sleep apnea) 07/29/2011  . Essential hypertension, benign 07/29/2011  . DOE (dyspnea on exertion) 07/29/2011  . Pure hypercholesterolemia 07/29/2011  . Routine general medical examination at a health care facility 07/29/2011    Bess Harvest, PTA 09/26/17 12:11 PM     [facility] [address]  [place], [place], [address] Phone: [phone]   Fax:  [phone]  Name: BREAUNNA GOTTLIEB MRN: [id number] Date of Birth: December 08, 1950

## 2017-09-29 ENCOUNTER — Ambulatory Visit: Payer: Medicare Other

## 2017-09-29 DIAGNOSIS — G8929 Other chronic pain: Secondary | ICD-10-CM | POA: Diagnosis not present

## 2017-09-29 DIAGNOSIS — M25552 Pain in left hip: Secondary | ICD-10-CM

## 2017-09-29 DIAGNOSIS — R29898 Other symptoms and signs involving the musculoskeletal system: Secondary | ICD-10-CM | POA: Diagnosis not present

## 2017-09-29 DIAGNOSIS — M545 Low back pain: Principal | ICD-10-CM

## 2017-09-29 NOTE — Therapy (Addendum)
[facility] [address]  [place], [place], [address] Phone: [phone]   Fax:  [phone]  Physical Therapy Treatment  Patient Details  Name: Kelsey Church MRN: [id number] Date of Birth: [birth year] Referring Provider: Hulan Saas, DO   Encounter Date: 09/29/2017  PT End of Session - 09/29/17 1018    Visit Number  5    Number of Visits  13    Date for PT Re-Evaluation  10/24/17    Authorization Type  Medicare + BCBS    PT Start Time  1696    PT Stop Time  1055    PT Time Calculation (min)  40 min    Activity Tolerance  Patient tolerated treatment well    Behavior During Therapy  [facility] for tasks assessed/performed       Past Medical History:  Diagnosis Date  . Arthritis    PAIN AND OA LEFT KNEE; S/P RIGHT TOTAL KNEE ARTHROPLASTY 06/10/13  . Cancer ([place])    melanoma in 2 sights  . Complication of anesthesia    "chills every evening at sundown x 2 weeks"- no fever  . Depression   . Dysphagia   . GERD (gastroesophageal reflux disease)   . History of shingles    NO RESIDUAL PROBLEMS  . Hypertension   . Melanoma in situ ([place])   . Pain    LOWER BACK PAIN  . Sleep apnea 09/2011   CPAP  . Sleep apnea     Past Surgical History:  Procedure Laterality Date  . APPENDECTOMY  1958  . COLONOSCOPY    . COLONOSCOPY W/ POLYPECTOMY     7 polyps  . FOOT SURGERY Right    removal plantar wart  . GALLBLADDER SURGERY  02/14/1990  . INJECTION KNEE  09/2011   right   . KNEE ARTHROSCOPY  1996  . MELANOMA EXCISION  1992   right chin, back  . POLYPECTOMY    . SALPINGOOPHORECTOMY  2009   Right  . TOTAL KNEE ARTHROPLASTY Right 06/10/2013   Procedure: RIGHT TOTAL KNEE ARTHROPLASTY;  Surgeon: [name], MD;  Location: [facility];  Service: Orthopedics;  Laterality: Right;  . TOTAL KNEE ARTHROPLASTY Left 12/16/2013   Procedure: LEFT TOTAL KNEE ARTHROPLASTY;  Surgeon: [name], MD;  Location: [facility];  Service:  Orthopedics;  Laterality: Left;    There were no vitals filed for this visit.  Subjective Assessment - 09/29/17 1021    Subjective  Pt. noting she is feeling improvement in pain levels down L lateral thigh.  Notes remaining pain in your L buttocks/piriformis area which was addressed with STM/TPR with good response however ttp.      Pertinent History  L TKA, melanoma, dysphagia, GERD, hx shingles, HTN, LBP    Diagnostic tests  none on hip    Patient Stated Goals  work on tone in my back and figure out factors to bursitis    Currently in Pain?  No/denies    Pain Score  0-No pain    Multiple Pain Sites  No                       [facility] Adult PT Treatment/Exercise - 09/29/17 1026      Lumbar Exercises: Stretches   Other Lumbar Stretch Exercise  L sciatic nerve glide 3" x 15 reps    supine and seated      Lumbar Exercises: Aerobic   Nustep  Lvl 4, 6 min       Lumbar Exercises: Seated   Sit to Stand  15 reps    Sit to Stand Limitations  without UE support with red tB at knees    last five reps to airex pad    Other Seated Lumbar Exercises  Seated alternating hip abd/ER with red TB at knees x 15 reps     Other Seated Lumbar Exercises         Knee/Hip Exercises: Aerobic   Recumbent Bike  Lvl 2, 7 min       Knee/Hip Exercises: Standing   Functional Squat  10 reps;3 seconds    Functional Squat Limitations  with red TB at knees at counter       Knee/Hip Exercises: Sidelying   Clams  L clam shell with no resistance x 15 reps       Modalities   Modalities  Moist Heat      Moist Heat Therapy   Number Minutes Moist Heat  10 Minutes    Moist Heat Location  Hip   L      Manual Therapy   Manual Therapy  Soft tissue mobilization;Passive ROM;Myofascial release    Manual therapy comments  sidelying     Soft tissue mobilization  L piriformis, superior glute     Myofascial Release  TPR to L medial piriformis; ttp however good repsones                PT Short  Term Goals - 09/22/17 1154      PT SHORT TERM GOAL #1   Title  Patient to demonstrate independence with initial HEP.    Time  3    Period  Weeks    Status  Achieved        PT Long Term Goals - 09/19/17 1407      PT LONG TERM GOAL #1   Title  Patient to be independent with advanced HEP.    Time  6    Period  Weeks    Status  On-going      PT LONG TERM GOAL #2   Title  Patient to demonstrate [facility] and pain-free lumbar AROM.    Time  6    Period  Weeks    Status  On-going      PT LONG TERM GOAL #3   Title  Patient to report tolerance of 6 hours of L sidelying during sleep without pain limiting.     Time  6    Period  Weeks    Status  On-going      PT LONG TERM GOAL #4   Title  Patient to report tolerance of 1.5 hours of walking without pain limiting.     Time  6    Period  Weeks    Status  On-going            Plan - 09/29/17 1020    Clinical Impression Statement  Pt. noting ~ 85% improvement in pain levels since starting therapy.  Pt. noting some difficulty rising from lower surfaces without using UE however able to demo good overall control standing from mat table to airex pad today.  Remaining ttp/tightness in L proximal hip musculature, which was addressed with STM/TPR with good response.  Pt. noting she feels L lateral hip/thigh muscular tenderness is resolving with manual therapy and feels she is improving.  Ended session with trial of moist heat to L buttocks area as pt. noting  good benefit from heat applied at home to promote reduction in tenderness/tightness.  Will continue to progress.      Clinical Impairments Affecting Rehab Potential  B TKA, melanoma, dysphagia, GERD, hx shingles, HTN, LBP    PT Treatment/Interventions  ADLs/Self Care Home Management;Cryotherapy;Iontophoresis 4mg /ml Dexamethasone;Moist Heat;Ultrasound;Traction;Gait training;Stair training;Functional mobility training;Therapeutic activities;Therapeutic exercise;Manual techniques;Patient/family  education;Balance training;Neuromuscular re-education;Passive range of motion;Dry needling;Energy conservation;Splinting;Taping    Consulted and Agree with Plan of Care  Patient       Patient will benefit from skilled therapeutic intervention in order to improve the following deficits and impairments:  Decreased activity tolerance, Pain, Decreased mobility, Decreased balance, Difficulty walking, Decreased range of motion, Postural dysfunction  Visit Diagnosis: Chronic left-sided low back pain, with sciatica presence unspecified  Pain in left hip  Other symptoms and signs involving the musculoskeletal system     Problem List Patient Active Problem List   Diagnosis Date Noted  . Greater trochanteric bursitis of left hip 08/28/2017  . Contusion of left hip 08/02/2017  . Need for hepatitis C screening test 04/17/2017  . Degenerative lumbar disc 11/01/2016  . 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 Nonspecific abnormal electrocardiogram (ECG) (EKG) 08/27/2015  . BPPV (benign paroxysmal positional vertigo) 08/27/2015  . Gastroesophageal reflux disease with esophagitis 12/10/2014  . Moderate osteopenia 12/10/2014  . Vitamin B12 deficiency 12/10/2014  . OA (osteoarthritis) of knee 12/16/2013  . Prediabetes 12/03/2013  . Bursitis of right shoulder 10/04/2013  . Other screening mammogram 09/25/2012  . DJD (degenerative joint disease) of knee 09/09/2011  . OSA (obstructive sleep apnea) 07/29/2011  . Essential hypertension, benign 07/29/2011  . DOE (dyspnea on exertion) 07/29/2011  . Pure hypercholesterolemia 07/29/2011  . Routine general medical examination at a health care facility 07/29/2011    Bess Harvest, PTA 09/29/17 12:14 PM   [facility] [address]  [place], [place], [address] Phone: [phone]   Fax:  [phone]  Name: Kelsey Church MRN: [id number] Date of Birth: 1950-03-01

## 2017-10-02 DIAGNOSIS — M5136 Other intervertebral disc degeneration, lumbar region: Secondary | ICD-10-CM | POA: Diagnosis not present

## 2017-10-02 DIAGNOSIS — M531 Cervicobrachial syndrome: Secondary | ICD-10-CM | POA: Diagnosis not present

## 2017-10-02 DIAGNOSIS — M9904 Segmental and somatic dysfunction of sacral region: Secondary | ICD-10-CM | POA: Diagnosis not present

## 2017-10-02 DIAGNOSIS — M5137 Other intervertebral disc degeneration, lumbosacral region: Secondary | ICD-10-CM | POA: Diagnosis not present

## 2017-10-02 DIAGNOSIS — M9901 Segmental and somatic dysfunction of cervical region: Secondary | ICD-10-CM | POA: Diagnosis not present

## 2017-10-02 DIAGNOSIS — M9903 Segmental and somatic dysfunction of lumbar region: Secondary | ICD-10-CM | POA: Diagnosis not present

## 2017-10-03 ENCOUNTER — Ambulatory Visit: Payer: Medicare Other

## 2017-10-03 ENCOUNTER — Other Ambulatory Visit: Payer: Self-pay | Admitting: Internal Medicine

## 2017-10-03 DIAGNOSIS — M545 Low back pain: Secondary | ICD-10-CM | POA: Diagnosis not present

## 2017-10-03 DIAGNOSIS — G8929 Other chronic pain: Secondary | ICD-10-CM

## 2017-10-03 DIAGNOSIS — R29898 Other symptoms and signs involving the musculoskeletal system: Secondary | ICD-10-CM

## 2017-10-03 DIAGNOSIS — M25552 Pain in left hip: Secondary | ICD-10-CM | POA: Diagnosis not present

## 2017-10-03 DIAGNOSIS — I10 Essential (primary) hypertension: Secondary | ICD-10-CM

## 2017-10-03 NOTE — Therapy (Addendum)
[facility] [address]  [place], [place], [address] Phone: [phone]   Fax:  [phone]  Physical Therapy Treatment  Patient Details  Name: Kelsey Church MRN: [id number] Date of Birth: 1950/06/29 Referring Provider: Hulan Saas, DO   Progress Note Reporting Period 09/12/17 to 10/03/17  See note below for Objective Data and Assessment of Progress/Goals.    Encounter Date: 10/03/2017  PT End of Session - 10/03/17 1107    Visit Number  6    Number of Visits  13    Date for PT Re-Evaluation  10/24/17    Authorization Type  Medicare + BCBS    PT Start Time  1102    PT Stop Time  1158   ended visit with 10 min moist heat   PT Time Calculation (min)  56 min    Activity Tolerance  Patient tolerated treatment well    Behavior During Therapy  [facility] for tasks assessed/performed       Past Medical History:  Diagnosis Date  . Arthritis    PAIN AND OA LEFT KNEE; S/P RIGHT TOTAL KNEE ARTHROPLASTY 06/10/13  . Cancer ([place])    melanoma in 2 sights  . Complication of anesthesia    "chills every evening at sundown x 2 weeks"- no fever  . Depression   . Dysphagia   . GERD (gastroesophageal reflux disease)   . History of shingles    NO RESIDUAL PROBLEMS  . Hypertension   . Melanoma in situ ([place])   . Pain    LOWER BACK PAIN  . Sleep apnea 09/2011   CPAP  . Sleep apnea     Past Surgical History:  Procedure Laterality Date  . APPENDECTOMY  1958  . COLONOSCOPY    . COLONOSCOPY W/ POLYPECTOMY     7 polyps  . FOOT SURGERY Right    removal plantar wart  . GALLBLADDER SURGERY  02/14/1990  . INJECTION KNEE  09/2011   right   . KNEE ARTHROSCOPY  1996  . MELANOMA EXCISION  1992   right chin, back  . POLYPECTOMY    . SALPINGOOPHORECTOMY  2009   Right  . TOTAL KNEE ARTHROPLASTY Right 06/10/2013   Procedure: RIGHT TOTAL KNEE ARTHROPLASTY;  Surgeon: [name], MD;  Location: [facility];  Service: Orthopedics;   Laterality: Right;  . TOTAL KNEE ARTHROPLASTY Left 12/16/2013   Procedure: LEFT TOTAL KNEE ARTHROPLASTY;  Surgeon: [name], MD;  Location: [facility];  Service: Orthopedics;  Laterality: Left;    There were no vitals filed for this visit.  Subjective Assessment - 10/03/17 1106    Subjective  Pt. doing well today noting some muscular soreness after last visit x 1 day.      Pertinent History  L TKA, melanoma, dysphagia, GERD, hx shingles, HTN, LBP    How long can you stand comfortably?  2-3 hours    How long can you walk comfortably?  30 min     Diagnostic tests  none on hip    Patient Stated Goals  work on tone in my back and figure out factors to bursitis    Currently in Pain?  No/denies    Pain Score  0-No pain    Multiple Pain Sites  No                       [facility] Adult PT Treatment/Exercise - 10/03/17 1115  Self-Care   Self-Care  Other Self-Care Comments    Other Self-Care Comments   self-ball release on wall to L glute       Lumbar Exercises: Supine   Clam  15 reps;3 seconds    Clam Limitations  Hooklying with red TB at knees + abdom. bracing       Knee/Hip Exercises: Stretches   Piriformis Stretch  Left;30 seconds;3 reps    Piriformis Stretch Limitations  KTOS in seated and supine       Knee/Hip Exercises: Aerobic   Recumbent Bike  Lvl 2, 7 min       Knee/Hip Exercises: Machines for Strengthening   Cybex Knee Flexion  20# x 15 reps       Knee/Hip Exercises: Standing   Other Standing Knee Exercises  Side stepping with yellow looped TB at ankles 2 x 20 ft       Knee/Hip Exercises: Sidelying   Clams  L clam shell with no resistance x 10reps    less resistance as pt. noting difficulty last visit with thi     Moist Heat Therapy   Number Minutes Moist Heat  10 Minutes    Moist Heat Location  Hip   L            PT Education - 10/03/17 1212    Education Details  HEP update     Person(s) Educated  Patient    Methods   Explanation;Demonstration;Verbal cues;Handout    Comprehension  Verbalized understanding;Returned demonstration;Verbal cues required;Need further instruction       PT Short Term Goals - 09/22/17 1154      PT SHORT TERM GOAL #1   Title  Patient to demonstrate independence with initial HEP.    Time  3    Period  Weeks    Status  Achieved        PT Long Term Goals - 10/03/17 1144      PT LONG TERM GOAL #1   Title  Patient to be independent with advanced HEP.    Time  6    Period  Weeks    Status  Partially Met   met for current      PT LONG TERM GOAL #2   Title  Patient to demonstrate [facility] and pain-free lumbar AROM.    Time  6    Period  Weeks    Status  On-going      PT LONG TERM GOAL #3   Title  Patient to report tolerance of 6 hours of L sidelying during sleep without pain limiting.     Time  6    Period  Weeks    Status  Achieved      PT LONG TERM GOAL #4   Title  Patient to report tolerance of 1.5 hours of walking without pain limiting.     Time  6    Period  Weeks    Status  Achieved            Plan - 10/03/17 1109    Clinical Impression Statement  Pt. progressing well with therapy.  Notes ~ 90% improvement in pain since starting therapy.  Has now achieved most LTG's and will plan to address remaining goals and discuss possibility of 30-day hold with pt. and supervising PT at upcoming visit as pt. open to this.  Pt. with upcoming f/u with MD early next week.  Progressing well and on track to meet remaining goals.  Clinical Impairments Affecting Rehab Potential  B TKA, melanoma, dysphagia, GERD, hx shingles, HTN, LBP    PT Treatment/Interventions  ADLs/Self Care Home Management;Cryotherapy;Iontophoresis 25m/ml Dexamethasone;Moist Heat;Ultrasound;Traction;Gait training;Stair training;Functional mobility training;Therapeutic activities;Therapeutic exercise;Manual techniques;Patient/family education;Balance training;Neuromuscular re-education;Passive range of  motion;Dry needling;Energy conservation;Splinting;Taping    Consulted and Agree with Plan of Care  Patient       Patient will benefit from skilled therapeutic intervention in order to improve the following deficits and impairments:  Decreased activity tolerance, Pain, Decreased mobility, Decreased balance, Difficulty walking, Decreased range of motion, Postural dysfunction  Visit Diagnosis: Chronic left-sided low back pain, with sciatica presence unspecified  Pain in left hip  Other symptoms and signs involving the musculoskeletal system     Problem List Patient Active Problem List   Diagnosis Date Noted  . Greater trochanteric bursitis of left hip 08/28/2017  . Contusion of left hip 08/02/2017  . Need for hepatitis C screening test 04/17/2017  . Degenerative lumbar disc 11/01/2016  . 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 Nonspecific abnormal electrocardiogram (ECG) (EKG) 08/27/2015  . BPPV (benign paroxysmal positional vertigo) 08/27/2015  . Gastroesophageal reflux disease with esophagitis 12/10/2014  . Moderate osteopenia 12/10/2014  . Vitamin B12 deficiency 12/10/2014  . OA (osteoarthritis) of knee 12/16/2013  . Prediabetes 12/03/2013  . Bursitis of right shoulder 10/04/2013  . Other screening mammogram 09/25/2012  . DJD (degenerative joint disease) of knee 09/09/2011  . OSA (obstructive sleep apnea) 07/29/2011  . Essential hypertension, benign 07/29/2011  . DOE (dyspnea on exertion) 07/29/2011  . Pure hypercholesterolemia 07/29/2011  . Routine general medical examination at a health care facility 07/29/2011    MBess Harvest PTA 10/03/17 12:12 PM   [facility] [address] [place] [address]: [phone]  Fax:  [phone] Name: Kelsey STROLLOMRN: [id number] of Birth: [birth year] PHYSICAL THERAPY DISCHARGE SUMMARY  Visits from Start of Care: 6  Current functional level related  to goals / functional outcomes: Unable to assess; patient did not return since last session d/t resolution of symptoms    Remaining deficits: Unable to assess   Education / Equipment: HEP  Plan: Patient agrees to discharge.  Patient goals were not met. Patient is being discharged due to being pleased with the current functional level.  ?????     YJanene Harvey PT, DPT 11/03/17 10:32 AM

## 2017-10-06 ENCOUNTER — Ambulatory Visit: Payer: Medicare Other | Admitting: Physical Therapy

## 2017-10-10 ENCOUNTER — Ambulatory Visit: Payer: Medicare Other | Admitting: Family Medicine

## 2017-10-13 ENCOUNTER — Ambulatory Visit: Payer: Medicare Other | Admitting: Physical Therapy

## 2017-10-17 ENCOUNTER — Ambulatory Visit: Payer: Medicare Other

## 2017-10-26 ENCOUNTER — Ambulatory Visit: Payer: Medicare Other | Admitting: Family Medicine

## 2017-10-27 ENCOUNTER — Encounter: Payer: Medicare Other | Admitting: Physical Therapy

## 2017-11-01 ENCOUNTER — Other Ambulatory Visit (INDEPENDENT_AMBULATORY_CARE_PROVIDER_SITE_OTHER): Payer: Medicare Other

## 2017-11-01 ENCOUNTER — Encounter: Payer: Self-pay | Admitting: Internal Medicine

## 2017-11-01 ENCOUNTER — Telehealth: Payer: Self-pay

## 2017-11-01 ENCOUNTER — Ambulatory Visit (INDEPENDENT_AMBULATORY_CARE_PROVIDER_SITE_OTHER): Payer: Medicare Other | Admitting: Internal Medicine

## 2017-11-01 VITALS — BP 138/70 | HR 68 | Temp 97.6°F | Ht 66.0 in | Wt 199.2 lb

## 2017-11-01 DIAGNOSIS — T783XXA Angioneurotic edema, initial encounter: Secondary | ICD-10-CM | POA: Insufficient documentation

## 2017-11-01 DIAGNOSIS — E538 Deficiency of other specified B group vitamins: Secondary | ICD-10-CM

## 2017-11-01 DIAGNOSIS — I10 Essential (primary) hypertension: Secondary | ICD-10-CM | POA: Diagnosis not present

## 2017-11-01 LAB — FOLATE: Folate: 21.3 ng/mL (ref >5.9)

## 2017-11-01 LAB — CBC WITH DIFFERENTIAL/PLATELET
BASOS ABS: 0.1 10*3/uL (ref 0.0–0.1)
BASOS PCT: 1.4 % (ref 0.0–3.0)
EOS ABS: 0.2 10*3/uL (ref 0.0–0.7)
Eosinophils Relative: 2.6 % (ref 0.0–5.0)
HCT: 39.6 % (ref 36.0–46.0)
Hemoglobin: 13.2 g/dL (ref 12.0–15.0)
LYMPHS ABS: 1.9 10*3/uL (ref 0.7–4.0)
LYMPHS PCT: 22.4 % (ref 12.0–46.0)
MCHC: 33.2 g/dL (ref 30.0–36.0)
MCV: 83.9 fl (ref 78.0–100.0)
MONO ABS: 0.7 10*3/uL (ref 0.1–1.0)
Monocytes Relative: 8.3 % (ref 3.0–12.0)
NEUTROS ABS: 5.4 10*3/uL (ref 1.4–7.7)
Neutrophils Relative %: 65.3 % (ref 43.0–77.0)
PLATELETS: 285 10*3/uL (ref 150.0–400.0)
RBC: 4.72 Mil/uL (ref 3.87–5.11)
RDW: 14.4 % (ref 11.5–15.5)
WBC: 8.3 10*3/uL (ref 4.0–10.5)

## 2017-11-01 LAB — COMPREHENSIVE METABOLIC PANEL
ALK PHOS: 67 U/L (ref 39–117)
ALT: 17 U/L (ref 0–35)
AST: 16 U/L (ref 0–37)
Albumin: 4.4 g/dL (ref 3.5–5.2)
BILIRUBIN TOTAL: 0.4 mg/dL (ref 0.2–1.2)
BUN: 38 mg/dL — AB (ref 6–23)
CO2: 32 meq/L (ref 19–32)
Calcium: 9.5 mg/dL (ref 8.4–10.5)
Chloride: 101 mEq/L (ref 96–112)
Creatinine, Ser: 1.18 mg/dL (ref 0.40–1.20)
GFR: 48.53 mL/min — AB (ref >60.00)
GLUCOSE: 92 mg/dL (ref 70–99)
Potassium: 4 mEq/L (ref 3.5–5.1)
Sodium: 139 mEq/L (ref 135–145)
TOTAL PROTEIN: 7.2 g/dL (ref 6.0–8.3)

## 2017-11-01 LAB — VITAMIN B12: VITAMIN B 12: 711 pg/mL (ref 211–911)

## 2017-11-01 LAB — SEDIMENTATION RATE: Sed Rate: 13 mm/hr (ref 0–30)

## 2017-11-01 MED ORDER — DOXEPIN HCL 10 MG PO CAPS: 10.0000 mg | ORAL_CAPSULE | Freq: Every day | ORAL | 0 refills | Status: DC

## 2017-11-01 MED ORDER — METHYLPREDNISOLONE ACETATE 80 MG/ML IJ SUSP
120.0000 mg | Freq: Once | INTRAMUSCULAR | Status: AC
  Administered 2017-11-01: 120 mg via INTRAMUSCULAR

## 2017-11-01 NOTE — Patient Instructions (Signed)
Angioedema Angioedema is the sudden swelling of tissue in the body. Angioedema can affect any part of the body, but it most often affects the deeper parts of the skin, causing red, itchy patches (hives) to appear over the affected area. It often begins during the night and is found in the morning. Depending on the cause, angioedema may happen:  Only once.  Several times. It may come back in unpredictable patterns.  Repeatedly for several years. Over time, it may gradually stop coming back.  Angioedema can be life-threatening if it affects the air passages that you breathe through. What are the causes? This condition may be caused by:  Foods, such as milk, eggs, shellfish, wheat, or nuts.  Certain medicines, such as ACE inhibitors, antibiotics, nonsteroidal anti-inflammatory drugs, birth control pills, or dyes used in X-rays.  Insect stings.  Infections.  Angioedema can be inherited, and episodes can be triggered by:  Mild injury.  Dental work.  Surgery.  Stress.  Sudden changes in temperature.  Exercise.  In some cases, the cause of this condition is not known. What are the signs or symptoms? Symptoms of this condition depend on where the swelling happens. Symptoms may include:  Swollen skin.  Red, itchy patches of skin (hives).  Redness in the affected area.  Pain in the affected area.  Swollen lips or tongue.  Wheezing.  Breathing problems.  If your internal organs are involved, symptoms may also include:  Nausea.  Abdominal pain.  Vomiting.  Difficulty swallowing.  Difficulty passing urine.  How is this diagnosed? This condition may be diagnosed based on:  An exam of the affected area.  Your medical history.  Whether anyone in your family has had this condition before.  A review of any medicines you have been taking.  Tests, including: ? Allergy skin tests to see if the condition was caused by an allergic reaction. ? Blood tests to see  if the condition was caused by a gene. ? Tests to check for underlying diseases that could cause the condition.  How is this treated? Treatment for this condition depends on the cause. It may involve any of the following:  If something triggered the condition, making changes to keep it from triggering the condition again.  If the condition affects your breathing, having tubes placed in your airway to keep it open.  Taking medicines to treat symptoms or prevent future episodes. These may include: ? Antihistamines. ? Epinephrine injections. ? Steroids.  If your condition is severe, you may need to be treated at the hospital. Angioedema usually gets better in 24-48 hours. Follow these instructions at home:  Take over-the-counter and prescription medicines only as told by your health care provider.  If you were given medicines for emergency allergy treatment, always carry them with you.  Wear a medical bracelet as told by your health care provider.  If something triggers your condition, avoid the trigger, if possible.  If your condition is inherited and you are thinking about having children, talk to your health care provider. It is important to discuss the risks of passing on the condition to your children. Contact a health care provider if:  You have repeated episodes of angioedema.  Episodes of angioedema start to happen more often than they used to, even after you take steps to prevent them.  You have episodes of angioedema that are more severe than they have been before, even after you take steps to prevent them.  You are thinking about having children. Get  help right away if:  You have severe swelling of your mouth, tongue, or lips.  You have trouble breathing.  You have trouble swallowing.  You faint. This information is not intended to replace advice given to you by your health care provider. Make sure you discuss any questions you have with your health care  provider. Document Released: 04/11/2001 Document Revised: 08/29/2015 Document Reviewed: 08/11/2015 Elsevier Interactive Patient Education  [employer].

## 2017-11-01 NOTE — Progress Notes (Signed)
Subjective:  Patient ID: Kelsey Church, female    DOB: 05/23/1950  Age: 67 y.o. MRN: [id number]  CC: Rash   HPI Jesscia H Mallick presents for a 70-month history of intermittent swelling and red itchy splotches over her face and neck.  She thinks the symptoms are more prominent on the days that she adds a dose of Aleve to the meloxicam.  She has had a few episodes when she thought the symptoms were getting into her throat.  She denies any episodes of wheezing or shortness of breath.  She has tried to control the symptoms with Benadryl and an over-the-counter topical steroid without any improvement.  Outpatient Medications Prior to Visit  Medication Sig Dispense Refill  . citalopram (CELEXA) 20 MG tablet TAKE 1 TABLET (20 MG TOTAL) BY MOUTH AT BEDTIME.--FILL 07-18-2015- 90 tablet 1  . estradiol (CLIMARA - DOSED IN MG/24 HR) 0.05 mg/24hr patch PLACE 1 PATCH (0.05 MG TOTAL) ONTO THE SKIN ONCE A WEEK. 12 patch 2  . losartan-hydrochlorothiazide (HYZAAR) 100-25 MG tablet TAKE 1 TABLET BY MOUTH EVERY DAY 90 tablet 0  . progesterone (PROMETRIUM) 100 MG capsule Take 1 capsule (100 mg total) by mouth at bedtime. 90 capsule 4  . meloxicam (MOBIC) 15 MG tablet TAKE 1 TABLET BY MOUTH ONCE DAILY--FILL 08/09/15-- 90 tablet 1  . TURMERIC CURCUMIN PO Take by mouth.    . cyanocobalamin 1000 MCG tablet Take 1,000 mcg by mouth daily.    . predniSONE (DELTASONE) 20 MG tablet 40 mg daily for 5 days, 20 mg daily for 5 days then 10 mg daily for 5 days (Patient not taking: Reported on 09/12/2017) 18 tablet 0  . Vitamin D, Ergocalciferol, (DRISDOL) 50000 units CAPS capsule TAKE 1 CAPSULE (50,000 UNITS TOTAL) BY MOUTH EVERY 7 (SEVEN) DAYS. 12 capsule 0   No facility-administered medications prior to visit.     ROS Review of Systems  Constitutional: Negative for diaphoresis, fatigue and fever.  HENT: Negative.  Negative for sore throat.   Eyes: Negative for visual disturbance.  Respiratory: Negative for cough,  chest tightness, shortness of breath and wheezing.   Gastrointestinal: Negative for abdominal pain, constipation, diarrhea, nausea and vomiting.  Endocrine: Negative.   Genitourinary: Negative for difficulty urinating.  Musculoskeletal: Positive for arthralgias. Negative for joint swelling.  Skin: Positive for rash.  Neurological: Negative.  Negative for dizziness, weakness, light-headedness and headaches.  Hematological: Negative for adenopathy. Does not bruise/bleed easily.  Psychiatric/Behavioral: Negative.     Objective:  BP 138/70 (BP Location: Left Arm, Patient Position: Sitting, Cuff Size: Normal)   Pulse 68   Temp 97.6 F (36.4 C) (Oral)   Ht 5\' 6"  (1.676 m)   Wt 199 lb 4 oz (90.4 kg)   SpO2 97%   BMI 32.16 kg/m   BP Readings from Last 3 Encounters:  11/01/17 138/70  08/28/17 102/64  08/02/17 118/68    Wt Readings from Last 3 Encounters:  11/01/17 199 lb 4 oz (90.4 kg)  08/28/17 200 lb (90.7 kg)  06/05/17 195 lb (88.5 kg)    Physical Exam  Constitutional: She is oriented to person, place, and time. No distress.  HENT:  Mouth/Throat: Oropharynx is clear and moist. No oropharyngeal exudate.  Eyes: Conjunctivae are normal. No scleral icterus.  Neck: Normal range of motion. Neck supple. No JVD present. No thyromegaly present.  Cardiovascular: Normal rate, regular rhythm and normal heart sounds.  Pulmonary/Chest: Effort normal and breath sounds normal. No respiratory distress. She has no wheezes. She  has no rales.  Abdominal: Soft. Bowel sounds are normal. She exhibits no mass. There is no hepatosplenomegaly. There is no tenderness.  Musculoskeletal: Normal range of motion. She exhibits no edema, tenderness or deformity.  Lymphadenopathy:    She has no cervical adenopathy.  Neurological: She is alert and oriented to person, place, and time.  Skin: Skin is warm and dry. Rash noted. She is not diaphoretic. There is erythema. No pallor.  There is mild swelling about  the face over both lower eyelids.  There are also erythematous macules across the forehead, temples, neck, and lower aspect of the face.  None of these lesions look like targets.  There are no vesicles, induration, epidermal breakdown.  Vitals reviewed.   Lab Results  Component Value Date   WBC 8.3 11/01/2017   HGB 13.2 11/01/2017   HCT 39.6 11/01/2017   PLT 285.0 11/01/2017   GLUCOSE 92 11/01/2017   CHOL 178 04/17/2017   TRIG 88.0 04/17/2017   HDL 52.40 04/17/2017   LDLCALC 108 (H) 04/17/2017   ALT 17 11/01/2017   AST 16 11/01/2017   NA 139 11/01/2017   K 4.0 11/01/2017   CL 101 11/01/2017   CREATININE 1.18 11/01/2017   BUN 38 (H) 11/01/2017   CO2 32 11/01/2017   TSH 1.51 04/17/2017   INR 1.17 12/27/2013   HGBA1C 6.1 04/17/2017    [place] Limited Joint Space Structures Low Left  Result Date: 08/31/2017 Procedure: Real-time Ultrasound Guided Injection of left greater trochanteric bursitis secondary to patient's body habitus Device: GE Logiq Q7 Ultrasound guided injection is preferred based studies that show increased duration, increased effect, greater accuracy, decreased procedural pain, increased response rate, and decreased cost with ultrasound guided versus blind injection. Verbal informed consent obtained. Time-out conducted. Noted no overlying erythema, induration, or other signs of local infection. Skin prepped in a sterile fashion. Local anesthesia: Topical Ethyl chloride. With sterile technique and under real time ultrasound guidance: Greater trochanteric area was visualized and patient's bursa was noted. A 22-gauge 3 inch needle was inserted and 4 cc of 0.5% Marcaine and 1 cc of Kenalog 40 mg/dL was injected. Pictures taken Completed without difficulty Pain immediately resolved suggesting accurate placement of the medication. Advised to call if fevers/chills, erythema, induration, drainage, or persistent bleeding. Images permanently stored and available for review in the ultrasound  unit. Impression: Technically successful ultrasound guided injection.   Mm Diag Breast Tomo Uni Left  Result Date: 08/28/2017 CLINICAL DATA:  Six-month follow-up for a probably benign asymmetry in the left breast. EXAM: DIGITAL DIAGNOSTIC UNILATERAL LEFT MAMMOGRAM WITH CAD AND TOMO COMPARISON:  Previous exam(s). ACR Breast Density Category b: There are scattered areas of fibroglandular density. FINDINGS: The small asymmetry in the central far posterior appears mammographically stable. This can only be partially visualized on the MLO view. No suspicious calcifications, masses or areas of distortion are seen in the left breast. Mammographic images were processed with CAD. IMPRESSION: 1.  The left breast asymmetry is mammographically stable. RECOMMENDATION: Bilateral diagnostic mammogram and left breast ultrasound is recommended in January of 2020. I have discussed the findings and recommendations with the patient. Results were also provided in writing at the conclusion of the visit. If applicable, a reminder letter will be sent to the patient regarding the next appointment. BI-RADS CATEGORY  3: Probably benign. Electronically Signed   By: Ammie Ferrier M.D.   On: 08/28/2017 15:53    Assessment & Plan:   Shalaunda was seen today for rash.  Diagnoses  and all orders for this visit:  Allergic angioedema, initial encounter- Her lab work is negative for secondary or systemic causes or complications.  I am mainly concerned that this is caused by the NSAIDs so I have asked her to stop taking Aleve and meloxicam.  Will treat this with a potent oral antihistamine and an injection of methylprednisolone.  If this does not resolve soon then the next thing that I would take away would be the ARB and the thiazide diuretic. -     doxepin (SINEQUAN) 10 MG capsule; Take 1 capsule (10 mg total) by mouth at bedtime. -     CBC with Differential/Platelet; Future -     Sedimentation rate; Future -     Comprehensive  metabolic panel; Future -     methylPREDNISolone acetate (DEPO-MEDROL) injection 120 mg  Essential hypertension, benign- Her blood pressure is well controlled.  She is mildly dehydrated but otherwise her electrolytes and renal function are normal. -     Comprehensive metabolic panel; Future  Vitamin B12 deficiency- Improvement noted. -     CBC with Differential/Platelet; Future -     Vitamin B12; Future -     Folate; Future   I have discontinued Hennessey H. Reith "Sue"'s cyanocobalamin, TURMERIC CURCUMIN PO, Vitamin D (Ergocalciferol), predniSONE, and meloxicam. I am also having her start on doxepin. Additionally, I am having her maintain her progesterone, estradiol, citalopram, and losartan-hydrochlorothiazide. We administered methylPREDNISolone acetate.  Meds ordered this encounter  Medications  . doxepin (SINEQUAN) 10 MG capsule    Sig: Take 1 capsule (10 mg total) by mouth at bedtime.    Dispense:  90 capsule    Refill:  0  . methylPREDNISolone acetate (DEPO-MEDROL) injection 120 mg     Follow-up: Return in about 3 weeks (around 11/22/2017).  [name], MD

## 2017-11-01 NOTE — Telephone Encounter (Signed)
Copied from [place] [id number]. Topic: Appointment Scheduling - Scheduling Inquiry for Clinic >> Nov 01, 2017  8:28 AM Scherrie Gerlach wrote: Reason for CRM: pt states her right shoulder bursitis is giving her pain.  Pt had to go to [place] as her sister had a stroke and had to cancel her 9/12 appt, just got home yesterday. Pt has been doing ice on the shoulder, aleve, and Pennsaid.  Pt would like to know if Dr Tamala Julian will work her in?

## 2017-11-01 NOTE — Progress Notes (Signed)
[facility] [address] [place], [place] [address] Phone: [phone] Subjective:    I Kelsey Church am serving as a Education administrator for Dr. Hulan Saas.   CC: Left hip pain and right shoulder pain  IHK:[id number]  Kelsey Church is a 67 y.o. female coming in with complaint of left hip pain. Hip is doing well. Discontinued August 23rd. Just came back from [place]. Right shoulder pain. 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 Pain wakes her up at night. No numbness and tingling noted. Weakness. Loss of ROM. Pain is sharp in the morning. Also has left ankle pain. The more she's on her foot the more it swells. Thinks she bruised her foot.   Onset- August 23rd (shoulder) Tuesday for the foot Location- Entire shoulder down the arm Character- Sharp, dull, achy Aggravating factors- Reliving factors-  Therapies tried- oral meds.., ice  [phone]     Past Medical History:  Diagnosis Date  . Arthritis    PAIN AND OA LEFT KNEE; S/P RIGHT TOTAL KNEE ARTHROPLASTY 06/10/13  . Cancer ([facility])    melanoma in 2 sights  . Complication of anesthesia    "chills every evening at sundown x 2 weeks"- no fever  . Depression   . Dysphagia   . GERD (gastroesophageal reflux disease)   . History of shingles    NO RESIDUAL PROBLEMS  . Hypertension   . Melanoma in situ ([place])   . Pain    LOWER BACK PAIN  . Sleep apnea 09/2011   CPAP  . Sleep apnea    Past Surgical History:  Procedure Laterality Date  . APPENDECTOMY  1958  . COLONOSCOPY    . COLONOSCOPY W/ POLYPECTOMY     7 polyps  . FOOT SURGERY Right    removal plantar wart  . GALLBLADDER SURGERY  02/14/1990  . INJECTION KNEE  09/2011   right   . KNEE ARTHROSCOPY  1996  . MELANOMA EXCISION  1992   right chin, back  . POLYPECTOMY    . SALPINGOOPHORECTOMY  2009   Right  . TOTAL KNEE ARTHROPLASTY Right 06/10/2013   Procedure: RIGHT TOTAL KNEE ARTHROPLASTY;  Surgeon: [name], MD;   Location: [facility];  Service: Orthopedics;  Laterality: Right;  . TOTAL KNEE ARTHROPLASTY Left 12/16/2013   Procedure: LEFT TOTAL KNEE ARTHROPLASTY;  Surgeon: [name], MD;  Location: [facility];  Service: Orthopedics;  Laterality: Left;   Social History   Socioeconomic History  . Marital status: Widowed    Spouse name: Not on file  . Number of children: 1  . Years of education: Not on file  . Highest education level: Not on file  Occupational History  . Occupation: CMA    Employer: [employer]  . Financial resource strain: Not on file  . Food insecurity:    Worry: Not on file    Inability: Not on file  . Transportation needs:    Medical: Not on file    Non-medical: Not on file  Tobacco Use  . Smoking status: Never Smoker  . Smokeless tobacco: Never Used  Substance and Sexual Activity  . Alcohol use: No    Alcohol/week: 0.0 standard drinks  . Drug use: No  . Sexual activity: Never    Comment: 1st intercourse- 18, partners- 1,   Lifestyle  . Physical activity:    Days per week: Not on file    Minutes per session: Not on  file  . Stress: Not on file  Relationships  . Social connections:    Talks on phone: Not on file    Gets together: Not on file    Attends religious service: Not on file    Active member of club or organization: Not on file    Attends meetings of clubs or organizations: Not on file    Relationship status: Not on file  Other Topics Concern  . Not on file  Social History Narrative   Caffienated drinks-no   Seat belt use often-yes   Regular Exercise-yes   Smoke alarm in the home-yes   Firearms/guns in the home-no   History of physical abuse-no               Allergies  Allergen Reactions  . Aleve [Naproxen Sodium] Swelling    angioedema  . Meloxicam Swelling    angioedema   Family History  Problem Relation Age of Onset  . Heart disease Father   . Heart attack Father   . Hypertension Father   . Osteoporosis Mother   .  Breast cancer Mother   . Cancer Maternal Grandmother        Breast and Uterine Cancer  . Breast cancer Maternal Grandmother   . Thyroid cancer Maternal Grandmother   . Hypertension Sister   . Diabetes Sister   . Colon polyps Sister   . Colon cancer Neg Hx   . Rectal cancer Neg Hx   . Stomach cancer Neg Hx   . Esophageal cancer Neg Hx     Current Outpatient Medications (Endocrine & Metabolic):  .  estradiol (CLIMARA - DOSED IN MG/24 HR) 0.05 mg/24hr patch, PLACE 1 PATCH (0.05 MG TOTAL) ONTO THE SKIN ONCE A WEEK. .  progesterone (PROMETRIUM) 100 MG capsule, Take 1 capsule (100 mg total) by mouth at bedtime.  Current Outpatient Medications (Cardiovascular):  .  losartan-hydrochlorothiazide (HYZAAR) 100-25 MG tablet, TAKE 1 TABLET BY MOUTH EVERY DAY .  furosemide (LASIX) 20 MG tablet, Take 1 tablet (20 mg total) by mouth daily. Marland Kitchen  losartan (COZAAR) 100 MG tablet, Take 1 tablet (100 mg total) by mouth daily.     Current Outpatient Medications (Other):  .  citalopram (CELEXA) 20 MG tablet, TAKE 1 TABLET (20 MG TOTAL) BY MOUTH AT BEDTIME.--FILL 07-18-2015- .  doxepin (SINEQUAN) 10 MG capsule, Take 1 capsule (10 mg total) by mouth at bedtime.    Past medical history, social, surgical and family history all reviewed in electronic medical record.  No pertanent information unless stated regarding to the chief complaint.   Review of Systems:  No headache, visual changes, nausea, vomiting, diarrhea, constipation, dizziness, abdominal pain, skin rash, fevers, chills, night sweats, weight loss, swollen lymph nodes, chest pain, shortness of breath, mood changes.  Positive muscle aches, joint swelling, body aches  Objective  Blood pressure 100/60, pulse 76, height 5\' 6"  (1.676 m), weight 199 lb (90.3 kg), SpO2 96 %.    General: No apparent distress alert and oriented x3 mood and affect normal, dressed appropriately.  HEENT: Pupils equal, extraocular movements intact  Respiratory: Patient's  speak in full sentences and does not appear short of breath  Cardiovascular: No lower extremity edema, non tender, no erythema  Skin: Warm dry intact with no signs of infection or rash on extremities or on axial skeleton.  Abdomen: Soft nontender  Neuro: Cranial nerves II through XII are intact, neurovascularly intact in all extremities with 2+ DTRs and 2+ pulses.  Lymph: No lymphadenopathy  of posterior or anterior cervical chain or axillae bilaterally.  Gait mild antalgic MSK:  tender with full range of motion and good stability and symmetric strength and tone of  elbows, wrist, hip and ankles bilaterally.  Knee replacement is noted. No show some crepitus.  With some limited range of motion in all planes.  Positive impingement noted.  4 out of 5 strength of the rotator cuff.  Neurovascularly intact distally.  MSK US performed of: Right shoulder This study was ordered, performed, and interpreted by Charlann Boxer D.O.  Shoulder:   Supraspinatus: Tear but a significant amount of hypoechoic changes.  Calcific or uric acid deposits noted.  Procedure: Real-time Ultrasound Guided Injection of right glenohumeral joint Device: GE Logiq Q7  Ultrasound guided injection is preferred based studies that show increased duration, increased effect, greater accuracy, decreased procedural pain, increased response rate with ultrasound guided versus blind injection.  Verbal informed consent obtained.  Time-out conducted.  Noted no overlying erythema, induration, or other signs of local infection.  Skin prepped in a sterile fashion.  Local anesthesia: Topical Ethyl chloride.  With sterile technique and under real time ultrasound guidance:  Joint visualized.  23g 1  inch needle inserted posterior approach. Pictures taken for needle placement. Patient did have injection of 2 cc of 1% lidocaine, 2 cc of 0.5% Marcaine, and 1.0 cc of Kenalog 40 mg/dL. Completed without difficulty  Pain immediately resolved suggesting  accurate placement of the medication.  Advised to call if fevers/chills, erythema, induration, drainage, or persistent bleeding.  Images permanently stored and available for review in the ultrasound unit.  Impression: Technically successful ultrasound guided injection.   Impression and Recommendations:     This case required medical decision making of moderate complexity. The above documentation has been reviewed and is accurate and complete Lyndal Pulley, DO       Note: This dictation was prepared with Dragon dictation along with smaller phrase technology. Any transcriptional errors that result from this process are unintentional.

## 2017-11-01 NOTE — Telephone Encounter (Signed)
Could not leave message for patient to schedule as she did not have a voicemail.

## 2017-11-02 ENCOUNTER — Encounter: Payer: Self-pay | Admitting: Family Medicine

## 2017-11-02 ENCOUNTER — Ambulatory Visit (INDEPENDENT_AMBULATORY_CARE_PROVIDER_SITE_OTHER): Payer: Medicare Other | Admitting: Family Medicine

## 2017-11-02 ENCOUNTER — Ambulatory Visit: Payer: Self-pay

## 2017-11-02 ENCOUNTER — Encounter: Payer: Self-pay | Admitting: Internal Medicine

## 2017-11-02 VITALS — BP 100/60 | HR 76 | Ht 66.0 in | Wt 199.0 lb

## 2017-11-02 DIAGNOSIS — M2550 Pain in unspecified joint: Secondary | ICD-10-CM | POA: Diagnosis not present

## 2017-11-02 DIAGNOSIS — M7551 Bursitis of right shoulder: Secondary | ICD-10-CM

## 2017-11-02 MED ORDER — LOSARTAN POTASSIUM 100 MG PO TABS: 100.0000 mg | ORAL_TABLET | Freq: Every day | ORAL | 3 refills | Status: DC

## 2017-11-02 MED ORDER — FUROSEMIDE 20 MG PO TABS: 20.0000 mg | ORAL_TABLET | Freq: Every day | ORAL | 3 refills | Status: DC

## 2017-11-02 NOTE — Assessment & Plan Note (Signed)
May need work-up.  Patient is adamant that she feels that the hydrochlorothiazide is likely causing it.  We discussed decreasing this and starting Lasix at a low dose.  We will see how patient responds.  Will need work-up for such things as gout at follow-up.

## 2017-11-02 NOTE — Patient Instructions (Addendum)
Good to see you  Injected the shoulder today  Continue the exercises Stop the combo pill  Losartan 100 mg on its own  Lasix 20 mg daily  See me again in 4-6 weeks to make sure you are doing better

## 2017-11-02 NOTE — Assessment & Plan Note (Signed)
Repeat injection given today.  Discussed icing regimen and home exercises.  Discussed which activities to do which wants to avoid.  Patient is to increase activity as tolerated.  Follow-up again in 6 to 12 weeks.

## 2017-11-06 ENCOUNTER — Encounter: Payer: Self-pay | Admitting: Family Medicine

## 2017-11-07 ENCOUNTER — Encounter: Payer: Self-pay | Admitting: Internal Medicine

## 2017-11-07 MED ORDER — PREDNISONE 50 MG PO TABS: 50.0000 mg | ORAL_TABLET | Freq: Every day | ORAL | 0 refills | Status: DC

## 2017-11-07 NOTE — Telephone Encounter (Unsigned)
Copied from [place] [id number]. Topic: General - Other >> Nov 06, 2017 11:42 AM Judyann Munson wrote: Reason for CRM: Patient is calling to state she is still not feeling well. From her last office visit  on 11-02-17 with Dr.smith, her foot still has fluid and pain. She is schedule for a trip tomorrow and stated the lasix she feels is helping but she is not seeing a difference in the swollen. She is requesting a nurse give her a call back in regards to this. Please advise

## 2017-11-13 ENCOUNTER — Encounter: Payer: Self-pay | Admitting: Family Medicine

## 2017-11-13 ENCOUNTER — Ambulatory Visit (INDEPENDENT_AMBULATORY_CARE_PROVIDER_SITE_OTHER): Payer: Medicare Other | Admitting: Family Medicine

## 2017-11-13 DIAGNOSIS — M25572 Pain in left ankle and joints of left foot: Secondary | ICD-10-CM

## 2017-11-13 MED ORDER — PREDNISONE 50 MG PO TABS: 50.0000 mg | ORAL_TABLET | Freq: Every day | ORAL | 0 refills | Status: DC

## 2017-11-13 NOTE — Patient Instructions (Addendum)
Try to wear a good, supportive shoe.  You may need to use a wider shoe for now for comfort Try applying pennsaid to tender area twice per day.  Ice at the end of each day.  Follow up with Dr. Tamala Julian as well.

## 2017-11-13 NOTE — Telephone Encounter (Signed)
Routing to sports med

## 2017-11-13 NOTE — Progress Notes (Signed)
Kelsey Church - 67 y.o. female MRN [id number]  Date of birth: August 29, 1950  Subjective Chief Complaint  Patient presents with  . Gout    HPI Kelsey Church is a 67 y.o. female with history of arthritis here today with complain of L ankle pain.  She reports that she has had issues with her ankle for the past couple of weeks.  Initially thought this may be gout but has completed course of prednisone prescribed by Dr. Tamala Julian without any improvement.   Pain is located on lateral foot with some radiation across the midfoot.  Describes this as a burning sensation at times, denies sensation of numbness or tingling.  [place] when first getting up in the morning but worsens as the day goes on.  She did have some edema in her ankles and feet which has improved with lasix.  She denies localized swelling.   She denies any significant increase in walking or activity recently.    ROS:  A comprehensive ROS was completed and negative except as noted per HPI  Allergies  Allergen Reactions  . Aleve [Naproxen Sodium] Swelling    angioedema  . Meloxicam Swelling    angioedema    Past Medical History:  Diagnosis Date  . Arthritis    PAIN AND OA LEFT KNEE; S/P RIGHT TOTAL KNEE ARTHROPLASTY 06/10/13  . Cancer ([place])    melanoma in 2 sights  . Complication of anesthesia    "chills every evening at sundown x 2 weeks"- no fever  . Depression   . Dysphagia   . GERD (gastroesophageal reflux disease)   . History of shingles    NO RESIDUAL PROBLEMS  . Hypertension   . Melanoma in situ ([place])   . Pain    LOWER BACK PAIN  . Sleep apnea 09/2011   CPAP  . Sleep apnea     Past Surgical History:  Procedure Laterality Date  . APPENDECTOMY  1958  . COLONOSCOPY    . COLONOSCOPY W/ POLYPECTOMY     7 polyps  . FOOT SURGERY Right    removal plantar wart  . GALLBLADDER SURGERY  02/14/1990  . INJECTION KNEE  09/2011   right   . KNEE ARTHROSCOPY  1996  . MELANOMA EXCISION  1992   right chin, back  . POLYPECTOMY     . SALPINGOOPHORECTOMY  2009   Right  . TOTAL KNEE ARTHROPLASTY Right 06/10/2013   Procedure: RIGHT TOTAL KNEE ARTHROPLASTY;  Surgeon: [name], MD;  Location: [facility];  Service: Orthopedics;  Laterality: Right;  . TOTAL KNEE ARTHROPLASTY Left 12/16/2013   Procedure: LEFT TOTAL KNEE ARTHROPLASTY;  Surgeon: [name], MD;  Location: [facility];  Service: Orthopedics;  Laterality: Left;    Social History   Socioeconomic History  . Marital status: Widowed    Spouse name: Not on file  . Number of children: 1  . Years of education: Not on file  . Highest education level: Not on file  Occupational History  . Occupation: CMA    Employer: [employer]  . Financial resource strain: Not on file  . Food insecurity:    Worry: Not on file    Inability: Not on file  . Transportation needs:    Medical: Not on file    Non-medical: Not on file  Tobacco Use  . Smoking status: Never Smoker  . Smokeless tobacco: Never Used  Substance and Sexual Activity  . Alcohol use: No    Alcohol/week:  0.0 standard drinks  . Drug use: No  . Sexual activity: Never    Comment: 1st intercourse- 18, partners- 1,   Lifestyle  . Physical activity:    Days per week: Not on file    Minutes per session: Not on file  . Stress: Not on file  Relationships  . Social connections:    Talks on phone: Not on file    Gets together: Not on file    Attends religious service: Not on file    Active member of club or organization: Not on file    Attends meetings of clubs or organizations: Not on file    Relationship status: Not on file  Other Topics Concern  . Not on file  Social History Narrative   Caffienated drinks-no   Seat belt use often-yes   Regular Exercise-yes   Smoke alarm in the home-yes   Firearms/guns in the home-no   History of physical abuse-no                Family History  Problem Relation Age of Onset  . Heart disease Father   . Heart attack Father   . Hypertension  Father   . Osteoporosis Mother   . Breast cancer Mother   . Cancer Maternal Grandmother        Breast and Uterine Cancer  . Breast cancer Maternal Grandmother   . Thyroid cancer Maternal Grandmother   . Hypertension Sister   . Diabetes Sister   . Colon polyps Sister   . Colon cancer Neg Hx   . Rectal cancer Neg Hx   . Stomach cancer Neg Hx   . Esophageal cancer Neg Hx     Health Maintenance  Topic Date Due  . INFLUENZA VACCINE  05/16/2018 (Originally 09/14/2017)  . MAMMOGRAM  02/16/2019  . COLONOSCOPY  01/22/2020  . TETANUS/TDAP  05/17/2023  . DEXA SCAN  Completed  . Hepatitis C Screening  Completed  . PNA vac Low Risk Adult  Completed    ----------------------------------------------------------------------------------------------------------------------------------------------------------------------------------------------------------------- Physical Exam BP 98/62   Pulse 75   Temp 98.5 F (36.9 C)   Ht 5\' 6"  (1.676 m)   Wt 195 lb 12.8 oz (88.8 kg)   SpO2 97%   BMI 31.60 kg/m   Physical Exam  Constitutional: She is oriented to person, place, and time. She appears well-nourished. No distress.  HENT:  Head: Normocephalic and atraumatic.  Musculoskeletal:  TTP along lateral aspect of L foot with pain on resisted eversion.  No swelling, erythema or warmth noted.  She does over pronated with standing.    Neurological: She is alert and oriented to person, place, and time.  Skin: Skin is warm and dry. No rash noted.  Psychiatric: She has a normal mood and affect. Her behavior is normal.    ------------------------------------------------------------------------------------------------------------------------------------------------------------------------------------------------------------------- Assessment and Plan  Left ankle pain -Given history and appearance as well as lack of response to prednisone I don't think she has gout.  -Her exam is more consistent with  tendonitis, likely peroneal tendonitis.  Sural nerve neuropathy/entrapment is in the differential as well.  -She does not often wear supportive shoes.  Stressed importance of this. -Recommend icing daily  -Has pennsaid at home, recommend application of this BID. -Has f/u with sports med 10/17

## 2017-11-13 NOTE — Telephone Encounter (Signed)
Patient is calling back to follow up on this message. She states she takes the last dose of prednisone tomorrow and she has still not seen a change. Please contact.   CB# [phone]

## 2017-11-13 NOTE — Assessment & Plan Note (Addendum)
-  Given history and appearance as well as lack of response to prednisone I don't think she has gout.  -Her exam is more consistent with tendonitis, likely peroneal tendonitis.  Sural nerve neuropathy/entrapment is in the differential as well.  -She does not often wear supportive shoes.  Stressed importance of this. -Recommend icing daily  -Has pennsaid at home, recommend application of this BID. -Has f/u with sports med 10/17

## 2017-11-15 DIAGNOSIS — M7742 Metatarsalgia, left foot: Secondary | ICD-10-CM | POA: Diagnosis not present

## 2017-11-15 DIAGNOSIS — G5762 Lesion of plantar nerve, left lower limb: Secondary | ICD-10-CM | POA: Diagnosis not present

## 2017-11-15 DIAGNOSIS — M84375A Stress fracture, left foot, initial encounter for fracture: Secondary | ICD-10-CM | POA: Diagnosis not present

## 2017-11-21 ENCOUNTER — Encounter: Payer: Self-pay | Admitting: Internal Medicine

## 2017-11-21 DIAGNOSIS — M9904 Segmental and somatic dysfunction of sacral region: Secondary | ICD-10-CM | POA: Diagnosis not present

## 2017-11-21 DIAGNOSIS — M531 Cervicobrachial syndrome: Secondary | ICD-10-CM | POA: Diagnosis not present

## 2017-11-21 DIAGNOSIS — M9903 Segmental and somatic dysfunction of lumbar region: Secondary | ICD-10-CM | POA: Diagnosis not present

## 2017-11-21 DIAGNOSIS — M9901 Segmental and somatic dysfunction of cervical region: Secondary | ICD-10-CM | POA: Diagnosis not present

## 2017-11-21 DIAGNOSIS — M5136 Other intervertebral disc degeneration, lumbar region: Secondary | ICD-10-CM | POA: Diagnosis not present

## 2017-11-21 DIAGNOSIS — M5137 Other intervertebral disc degeneration, lumbosacral region: Secondary | ICD-10-CM | POA: Diagnosis not present

## 2017-11-22 ENCOUNTER — Other Ambulatory Visit: Payer: Self-pay | Admitting: Internal Medicine

## 2017-11-22 DIAGNOSIS — T783XXD Angioneurotic edema, subsequent encounter: Secondary | ICD-10-CM

## 2017-11-22 MED ORDER — MONTELUKAST SODIUM 10 MG PO TABS: 10.0000 mg | ORAL_TABLET | Freq: Every day | ORAL | 1 refills | Status: DC

## 2017-11-27 ENCOUNTER — Ambulatory Visit (INDEPENDENT_AMBULATORY_CARE_PROVIDER_SITE_OTHER): Payer: Medicare Other | Admitting: Internal Medicine

## 2017-11-27 ENCOUNTER — Encounter: Payer: Self-pay | Admitting: Internal Medicine

## 2017-11-27 VITALS — BP 120/80 | HR 80 | Temp 98.0°F | Resp 16 | Ht 66.0 in | Wt 200.0 lb

## 2017-11-27 DIAGNOSIS — Z23 Encounter for immunization: Secondary | ICD-10-CM | POA: Diagnosis not present

## 2017-11-27 DIAGNOSIS — I10 Essential (primary) hypertension: Secondary | ICD-10-CM | POA: Diagnosis not present

## 2017-11-27 DIAGNOSIS — T783XXD Angioneurotic edema, subsequent encounter: Secondary | ICD-10-CM | POA: Diagnosis not present

## 2017-11-27 MED ORDER — LEVOCETIRIZINE DIHYDROCHLORIDE 5 MG PO TABS: 5.0000 mg | ORAL_TABLET | Freq: Every evening | ORAL | 1 refills | Status: DC

## 2017-11-27 MED ORDER — MONTELUKAST SODIUM 10 MG PO TABS: 10.0000 mg | ORAL_TABLET | Freq: Every day | ORAL | 1 refills | Status: DC

## 2017-11-27 NOTE — Patient Instructions (Signed)

## 2017-11-27 NOTE — Progress Notes (Signed)
Subjective:  Patient ID: Kelsey Church, female    DOB: [birth year]  Age: 67 y.o. MRN: [id number]  CC: Rash and Hypertension   HPI Kelsey Church presents for f/up - She continues to struggle with facial swelling, rash, and itching.  I prescribed Singulair about 5 days ago but she is not taking it.  She has been taking the doxepin but she complains it dries out her nose.  She wants to switch to a different antihistamine.  She does not think stopping the anti-inflammatories has helped much.  She stopped taking the thiazide diuretic and is switched to a loop diuretic.  She continues to come take the ARB.  Recently took a course of prednisone and says that helped some but now that she has tapered the dose the symptoms are coming back.  Outpatient Medications Prior to Visit  Medication Sig Dispense Refill  . citalopram (CELEXA) 20 MG tablet TAKE 1 TABLET (20 MG TOTAL) BY MOUTH AT BEDTIME.--FILL 07-18-2015- 90 tablet 1  . estradiol (CLIMARA - DOSED IN MG/24 HR) 0.05 mg/24hr patch PLACE 1 PATCH (0.05 MG TOTAL) ONTO THE SKIN ONCE A WEEK. 12 patch 2  . furosemide (LASIX) 20 MG tablet Take 1 tablet (20 mg total) by mouth daily. 30 tablet 3  . progesterone (PROMETRIUM) 100 MG capsule Take 1 capsule (100 mg total) by mouth at bedtime. 90 capsule 4  . traMADol (ULTRAM) 50 MG tablet TK 1 T PO Q 4 TO 6 H PRN P  0  . doxepin (SINEQUAN) 10 MG capsule Take 1 capsule (10 mg total) by mouth at bedtime. 90 capsule 0  . losartan (COZAAR) 100 MG tablet Take 1 tablet (100 mg total) by mouth daily. 30 tablet 3  . montelukast (SINGULAIR) 10 MG tablet Take 1 tablet (10 mg total) by mouth at bedtime. 90 tablet 1   No facility-administered medications prior to visit.     ROS Review of Systems  Constitutional: Negative for appetite change, diaphoresis and fatigue.  HENT: Positive for facial swelling. Negative for rhinorrhea, sinus pressure, sore throat and trouble swallowing.   Eyes: Negative.   Respiratory:  Negative for cough, chest tightness, shortness of breath and wheezing.   Cardiovascular: Negative for chest pain, palpitations and leg swelling.  Gastrointestinal: Negative for abdominal pain, constipation, diarrhea, nausea and vomiting.  Genitourinary: Negative.  Negative for difficulty urinating and dysuria.  Musculoskeletal: Negative.   Skin: Positive for rash. Negative for color change.  Neurological: Negative.  Negative for dizziness, weakness, light-headedness and headaches.  Hematological: Negative for adenopathy. Does not bruise/bleed easily.  Psychiatric/Behavioral: Negative.     Objective:  BP 120/80 (BP Location: Left Arm, Patient Position: Sitting, Cuff Size: Normal)   Pulse 80   Temp 98 F (36.7 C) (Oral)   Resp 16   Ht 5\' 6"  (1.676 m)   Wt 200 lb (90.7 kg)   SpO2 98%   BMI 32.28 kg/m   BP Readings from Last 3 Encounters:  11/27/17 120/80  11/13/17 98/62  11/02/17 100/60    Wt Readings from Last 3 Encounters:  11/27/17 200 lb (90.7 kg)  11/13/17 195 lb 12.8 oz (88.8 kg)  11/02/17 199 lb (90.3 kg)    Physical Exam  Constitutional: She is oriented to person, place, and time. No distress.  HENT:  Head:    Mouth/Throat: Oropharynx is clear and moist and mucous membranes are normal. Mucous membranes are not pale, not dry and not cyanotic. No oropharyngeal exudate, posterior oropharyngeal edema, posterior  oropharyngeal erythema or tonsillar abscesses.  Eyes: Conjunctivae are normal. No scleral icterus.  Neck: Normal range of motion. Neck supple. No JVD present. No thyromegaly present.  Cardiovascular: Normal rate, regular rhythm and normal heart sounds. Exam reveals no gallop.  No murmur heard. Pulmonary/Chest: Effort normal and breath sounds normal. No respiratory distress. She has no wheezes. She has no rales.  Abdominal: Soft. Bowel sounds are normal. She exhibits no mass. There is no hepatosplenomegaly. There is no tenderness.  Musculoskeletal: Normal range  of motion. She exhibits no edema, tenderness or deformity.  Lymphadenopathy:    She has no cervical adenopathy.  Neurological: She is alert and oriented to person, place, and time.  Skin: Skin is warm. Rash noted. She is not diaphoretic. There is erythema.  Vitals reviewed.   Lab Results  Component Value Date   WBC 8.3 11/01/2017   HGB 13.2 11/01/2017   HCT 39.6 11/01/2017   PLT 285.0 11/01/2017   GLUCOSE 92 11/01/2017   CHOL 178 04/17/2017   TRIG 88.0 04/17/2017   HDL 52.40 04/17/2017   LDLCALC 108 (H) 04/17/2017   ALT 17 11/01/2017   AST 16 11/01/2017   NA 139 11/01/2017   K 4.0 11/01/2017   CL 101 11/01/2017   CREATININE 1.18 11/01/2017   BUN 38 (H) 11/01/2017   CO2 32 11/01/2017   TSH 1.51 04/17/2017   INR 1.17 12/27/2013   HGBA1C 6.1 04/17/2017    [place] Limited Joint Space Structures Low Left  Result Date: 08/31/2017 Procedure: Real-time Ultrasound Guided Injection of left greater trochanteric bursitis secondary to patient's body habitus Device: GE Logiq Q7 Ultrasound guided injection is preferred based studies that show increased duration, increased effect, greater accuracy, decreased procedural pain, increased response rate, and decreased cost with ultrasound guided versus blind injection. Verbal informed consent obtained. Time-out conducted. Noted no overlying erythema, induration, or other signs of local infection. Skin prepped in a sterile fashion. Local anesthesia: Topical Ethyl chloride. With sterile technique and under real time ultrasound guidance: Greater trochanteric area was visualized and patient's bursa was noted. A 22-gauge 3 inch needle was inserted and 4 cc of 0.5% Marcaine and 1 cc of Kenalog 40 mg/dL was injected. Pictures taken Completed without difficulty Pain immediately resolved suggesting accurate placement of the medication. Advised to call if fevers/chills, erythema, induration, drainage, or persistent bleeding. Images permanently stored and available for  review in the ultrasound unit. Impression: Technically successful ultrasound guided injection.   Mm Diag Breast Tomo Uni Left  Result Date: 08/28/2017 CLINICAL DATA:  Six-month follow-up for a probably benign asymmetry in the left breast. EXAM: DIGITAL DIAGNOSTIC UNILATERAL LEFT MAMMOGRAM WITH CAD AND TOMO COMPARISON:  Previous exam(s). ACR Breast Density Category b: There are scattered areas of fibroglandular density. FINDINGS: The small asymmetry in the central far posterior appears mammographically stable. This can only be partially visualized on the MLO view. No suspicious calcifications, masses or areas of distortion are seen in the left breast. Mammographic images were processed with CAD. IMPRESSION: 1.  The left breast asymmetry is mammographically stable. RECOMMENDATION: Bilateral diagnostic mammogram and left breast ultrasound is recommended in January of 2020. I have discussed the findings and recommendations with the patient. Results were also provided in writing at the conclusion of the visit. If applicable, a reminder letter will be sent to the patient regarding the next appointment. BI-RADS CATEGORY  3: Probably benign. Electronically Signed   By: Ammie Ferrier M.D.   On: 08/28/2017 15:53    Assessment &  Plan:   [place] was seen today for rash and hypertension.  Diagnoses and all orders for this visit:  Essential hypertension, benign- Her blood pressure is very well controlled.  I am concerned the ARB may be contributing to her symptoms so I have asked her to stop taking losartan.  Will continue the loop diuretic for now.  Allergic angioedema, subsequent encounter- Will discontinue losartan since this may be causing her allergic reaction.  She agrees to start Singulair and will change doxepin to levocetirizine at her request. -     levocetirizine (XYZAL) 5 MG tablet; Take 1 tablet (5 mg total) by mouth every evening. -     montelukast (SINGULAIR) 10 MG tablet; Take 1 tablet (10 mg  total) by mouth at bedtime.  Need for immunization against influenza -     Flu vaccine HIGH DOSE PF (Fluzone High dose)   I have discontinued Yakira H. Berrones "Sue"'s doxepin and losartan. I am also having her start on levocetirizine. Additionally, I am having her maintain her progesterone, estradiol, citalopram, furosemide, traMADol, and montelukast.  Meds ordered this encounter  Medications  . levocetirizine (XYZAL) 5 MG tablet    Sig: Take 1 tablet (5 mg total) by mouth every evening.    Dispense:  90 tablet    Refill:  1  . montelukast (SINGULAIR) 10 MG tablet    Sig: Take 1 tablet (10 mg total) by mouth at bedtime.    Dispense:  90 tablet    Refill:  1     Follow-up: Return in about 4 months (around 03/30/2018).  [name], MD

## 2017-11-28 DIAGNOSIS — M531 Cervicobrachial syndrome: Secondary | ICD-10-CM | POA: Diagnosis not present

## 2017-11-28 DIAGNOSIS — M5136 Other intervertebral disc degeneration, lumbar region: Secondary | ICD-10-CM | POA: Diagnosis not present

## 2017-11-28 DIAGNOSIS — M9904 Segmental and somatic dysfunction of sacral region: Secondary | ICD-10-CM | POA: Diagnosis not present

## 2017-11-28 DIAGNOSIS — M9903 Segmental and somatic dysfunction of lumbar region: Secondary | ICD-10-CM | POA: Diagnosis not present

## 2017-11-28 DIAGNOSIS — M5137 Other intervertebral disc degeneration, lumbosacral region: Secondary | ICD-10-CM | POA: Diagnosis not present

## 2017-11-28 DIAGNOSIS — M9901 Segmental and somatic dysfunction of cervical region: Secondary | ICD-10-CM | POA: Diagnosis not present

## 2017-11-30 ENCOUNTER — Ambulatory Visit: Payer: Medicare Other | Admitting: Family Medicine

## 2017-11-30 DIAGNOSIS — H2513 Age-related nuclear cataract, bilateral: Secondary | ICD-10-CM | POA: Diagnosis not present

## 2017-11-30 DIAGNOSIS — H35361 Drusen (degenerative) of macula, right eye: Secondary | ICD-10-CM | POA: Diagnosis not present

## 2017-12-03 ENCOUNTER — Other Ambulatory Visit: Payer: Self-pay

## 2017-12-03 ENCOUNTER — Encounter: Payer: Self-pay | Admitting: Internal Medicine

## 2017-12-03 ENCOUNTER — Encounter (HOSPITAL_BASED_OUTPATIENT_CLINIC_OR_DEPARTMENT_OTHER): Payer: Self-pay | Admitting: *Deleted

## 2017-12-03 ENCOUNTER — Emergency Department (HOSPITAL_BASED_OUTPATIENT_CLINIC_OR_DEPARTMENT_OTHER)
Admission: EM | Admit: 2017-12-03 | Discharge: 2017-12-03 | Disposition: A | Discharge status: Home / Self Care | Payer: Medicare Other | Attending: Emergency Medicine | Admitting: Emergency Medicine

## 2017-12-03 DIAGNOSIS — I10 Essential (primary) hypertension: Secondary | ICD-10-CM | POA: Diagnosis not present

## 2017-12-03 DIAGNOSIS — Z96651 Presence of right artificial knee joint: Secondary | ICD-10-CM | POA: Diagnosis not present

## 2017-12-03 DIAGNOSIS — R7303 Prediabetes: Secondary | ICD-10-CM | POA: Diagnosis not present

## 2017-12-03 DIAGNOSIS — Z79899 Other long term (current) drug therapy: Secondary | ICD-10-CM | POA: Insufficient documentation

## 2017-12-03 DIAGNOSIS — R21 Rash and other nonspecific skin eruption: Secondary | ICD-10-CM | POA: Diagnosis not present

## 2017-12-03 DIAGNOSIS — R220 Localized swelling, mass and lump, head: Secondary | ICD-10-CM | POA: Insufficient documentation

## 2017-12-03 DIAGNOSIS — Z96652 Presence of left artificial knee joint: Secondary | ICD-10-CM | POA: Insufficient documentation

## 2017-12-03 DIAGNOSIS — Z85820 Personal history of malignant melanoma of skin: Secondary | ICD-10-CM | POA: Insufficient documentation

## 2017-12-03 MED ORDER — PREDNISONE 10 MG (21) PO TBPK: ORAL_TABLET | Freq: Every day | ORAL | 0 refills | Status: DC

## 2017-12-03 NOTE — Discharge Instructions (Signed)
You should consider switching your CPAP mask and strapping as this may be causing an allergic reaction.  Please continue to work with your doctor for referral on to an allergist or dermatologist.  We are prescribing a steroid taper.

## 2017-12-03 NOTE — ED Triage Notes (Signed)
Pt reports dx with angioedema on Sept 18 at her PCP.  Pt with facial redness, increased swelling and rash today.

## 2017-12-03 NOTE — ED Provider Notes (Signed)
[facility] EMERGENCY DEPARTMENT Provider Note   CSN: [id number] Arrival date & time: 12/03/17  1431     History   Chief Complaint Chief Complaint  Patient presents with  . Facial Swelling    HPI Kelsey Church is a 67 y.o. female.  She presents with on and off facial rash and swelling that is been going on since June.  Her doctor is tried multiple medication changes and withdrawals and she still continues to have the symptoms.  This never bothered her with breathing or throat swelling or lip swelling or tongue swelling.  She did have some relief when she went on prednisone for a joint issue.  She does use CPAP at night.  She can think of no other exposures or anything topically she is putting on her face.  No fevers no chills no nausea no vomiting.  The history is provided by the patient.  Rash   This is a chronic problem. The problem has not changed since onset.The problem is associated with nothing. There has been no fever. The rash is present on the face. The patient is experiencing no pain. She has tried antihistamines and steriods for the symptoms. The treatment provided moderate relief.    Past Medical History:  Diagnosis Date  . Arthritis    PAIN AND OA LEFT KNEE; S/P RIGHT TOTAL KNEE ARTHROPLASTY 06/10/13  . Cancer ([place])    melanoma in 2 sights  . Complication of anesthesia    "chills every evening at sundown x 2 weeks"- no fever  . Depression   . Dysphagia   . GERD (gastroesophageal reflux disease)   . History of shingles    NO RESIDUAL PROBLEMS  . Hypertension   . Melanoma in situ ([place])   . Pain    LOWER BACK PAIN  . Sleep apnea 09/2011   CPAP  . Sleep apnea     Patient Active Problem List   Diagnosis Date Noted  . Polyarthralgia 11/02/2017  . 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 Greater trochanteric bursitis of left hip 08/28/2017  . Degenerative lumbar disc 11/01/2016  . 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 Nonspecific abnormal electrocardiogram  (ECG) (EKG) 08/27/2015  . Gastroesophageal reflux disease with esophagitis 12/10/2014  . Moderate osteopenia 12/10/2014  . Vitamin B12 deficiency 12/10/2014  . OA (osteoarthritis) of knee 12/16/2013  . Prediabetes 12/03/2013  . Bursitis of right shoulder 10/04/2013  . Other screening mammogram 09/25/2012  . DJD (degenerative joint disease) of knee 09/09/2011  . OSA (obstructive sleep apnea) 07/29/2011  . Essential hypertension, benign 07/29/2011  . Pure hypercholesterolemia 07/29/2011  . Routine general medical examination at a health care facility 07/29/2011    Past Surgical History:  Procedure Laterality Date  . APPENDECTOMY  1958  . COLONOSCOPY    . COLONOSCOPY W/ POLYPECTOMY     7 polyps  . FOOT SURGERY Right    removal plantar wart  . GALLBLADDER SURGERY  02/14/1990  . INJECTION KNEE  09/2011   right   . KNEE ARTHROSCOPY  1996  . MELANOMA EXCISION  1992   right chin, back  . POLYPECTOMY    . SALPINGOOPHORECTOMY  2009   Right  . TOTAL KNEE ARTHROPLASTY Right 06/10/2013   Procedure: RIGHT TOTAL KNEE ARTHROPLASTY;  Surgeon: [name], MD;  Location: [facility];  Service: Orthopedics;  Laterality: Right;  . TOTAL KNEE ARTHROPLASTY Left 12/16/2013   Procedure: LEFT TOTAL KNEE ARTHROPLASTY;  Surgeon: [name], MD;  Location: [facility];  Service:  Orthopedics;  Laterality: Left;     OB History    Gravida  2   Para  1   Term      Preterm  1   AB      Living  1     SAB      TAB      Ectopic      Multiple      Live Births               Home Medications    Prior to Admission medications   Medication Sig Start Date End Date Taking? Authorizing Provider  citalopram (CELEXA) 20 MG tablet TAKE 1 TABLET (20 MG TOTAL) BY MOUTH AT BEDTIME.--FILL 07-18-2015- 10/03/17   Janith Lima, MD  estradiol (CLIMARA - DOSED IN MG/24 HR) 0.05 mg/24hr patch PLACE 1 PATCH (0.05 MG TOTAL) ONTO THE SKIN ONCE A WEEK. 06/07/17   Princess Bruins, MD  furosemide (LASIX)  20 MG tablet Take 1 tablet (20 mg total) by mouth daily. 11/02/17   Lyndal Pulley, DO  levocetirizine (XYZAL) 5 MG tablet Take 1 tablet (5 mg total) by mouth every evening. 11/27/17   Janith Lima, MD  montelukast (SINGULAIR) 10 MG tablet Take 1 tablet (10 mg total) by mouth at bedtime. 11/27/17   Janith Lima, MD  progesterone (PROMETRIUM) 100 MG capsule Take 1 capsule (100 mg total) by mouth at bedtime. 01/20/17   Princess Bruins, MD  traMADol (ULTRAM) 50 MG tablet TK 1 T PO Q 4 TO 6 H PRN P 11/15/17   [provider]    Family History Family History  Problem Relation Age of Onset  . Heart disease Father   . Heart attack Father   . Hypertension Father   . Osteoporosis Mother   . Breast cancer Mother   . Cancer Maternal Grandmother        Breast and Uterine Cancer  . Breast cancer Maternal Grandmother   . Thyroid cancer Maternal Grandmother   . Hypertension Sister   . Diabetes Sister   . Colon polyps Sister   . Colon cancer Neg Hx   . Rectal cancer Neg Hx   . Stomach cancer Neg Hx   . Esophageal cancer Neg Hx     Social History Social History   Tobacco Use  . Smoking status: Never Smoker  . Smokeless tobacco: Never Used  Substance Use Topics  . Alcohol use: No    Alcohol/week: 0.0 standard drinks  . Drug use: No     Allergies   Aleve [naproxen sodium] and Meloxicam   Review of Systems Review of Systems  Constitutional: Negative for fever.  HENT: Negative for sore throat.   Eyes: Negative for visual disturbance.  Respiratory: Negative for shortness of breath.   Cardiovascular: Negative for chest pain.  Gastrointestinal: Negative for abdominal pain.  Genitourinary: Negative for dysuria.  Musculoskeletal: Negative for neck pain.  Skin: Positive for rash.  Neurological: Negative for headaches.     Physical Exam Updated Vital Signs BP (!) 142/81 (BP Location: Left Arm)   Pulse 83   Temp 98.4 F (36.9 C) (Oral)   Resp 18   Ht (!) 6"  (0.152 m)   Wt 90.7 kg   SpO2 98%   BMI 3905.97 kg/m   Physical Exam  Constitutional: She appears well-developed and well-nourished.  HENT:  Head: Normocephalic and atraumatic.  Right Ear: External ear normal.  Left Ear: External ear normal.  Nose: Nose normal.  Mouth/Throat: Oropharynx is clear and moist.  Eyes: Pupils are equal, round, and reactive to light. Conjunctivae and EOM are normal.  Neck: Neck supple.  Pulmonary/Chest: Effort normal. No stridor. She has no wheezes. She has no rales.  Musculoskeletal: She exhibits no tenderness or deformity.  Neurological: She is alert. GCS eye subscore is 4. GCS verbal subscore is 5. GCS motor subscore is 6.  Skin: Skin is warm and dry. Rash noted.  She has some erythema in patches over her forehead and around her nose under her eyes and on her chin.  No blistering no weeping.  Psychiatric: She has a normal mood and affect.  Nursing note and vitals reviewed.    ED Treatments / Results  Labs (all labs ordered are listed, but only abnormal results are displayed) Labs Reviewed - No data to display  EKG None  Radiology No results found.  Procedures Procedures (including critical care time)  Medications Ordered in ED Medications - No data to display   Initial Impression / Assessment and Plan / ED Course  I have reviewed the triage vital signs and the nursing notes.  Pertinent labs & imaging results that were available during my care of the patient were reviewed by me and considered in my medical decision making (see chart for details).    Patient's symptoms do not appear to be angioedema in nature.  There is not true edema of the tissue but just a surface rash around her face.  She said she had the most relief with being on steroids and so I do not think it will resolve her back on it.  I am wondering if her CPAP mask and the strapping may be causing some of this reaction as there is no other obvious trigger.  She is going to  check with the respiratory company and see if they have have other type of material for that.  Final Clinical Impressions(s) / ED Diagnoses   Final diagnoses:  Facial rash    ED Discharge Orders         Ordered    predniSONE (STERAPRED UNI-PAK 21 TAB) 10 MG (21) TBPK tablet  Daily     12/03/17 1643           Hayden Rasmussen, MD 12/03/17 2301

## 2017-12-04 ENCOUNTER — Other Ambulatory Visit: Payer: Self-pay | Admitting: Internal Medicine

## 2017-12-04 DIAGNOSIS — T783XXS Angioneurotic edema, sequela: Secondary | ICD-10-CM

## 2017-12-05 DIAGNOSIS — M5136 Other intervertebral disc degeneration, lumbar region: Secondary | ICD-10-CM | POA: Diagnosis not present

## 2017-12-05 DIAGNOSIS — M9901 Segmental and somatic dysfunction of cervical region: Secondary | ICD-10-CM | POA: Diagnosis not present

## 2017-12-05 DIAGNOSIS — M5137 Other intervertebral disc degeneration, lumbosacral region: Secondary | ICD-10-CM | POA: Diagnosis not present

## 2017-12-05 DIAGNOSIS — M9904 Segmental and somatic dysfunction of sacral region: Secondary | ICD-10-CM | POA: Diagnosis not present

## 2017-12-05 DIAGNOSIS — M9903 Segmental and somatic dysfunction of lumbar region: Secondary | ICD-10-CM | POA: Diagnosis not present

## 2017-12-05 DIAGNOSIS — M531 Cervicobrachial syndrome: Secondary | ICD-10-CM | POA: Diagnosis not present

## 2017-12-13 DIAGNOSIS — M84375A Stress fracture, left foot, initial encounter for fracture: Secondary | ICD-10-CM | POA: Diagnosis not present

## 2017-12-19 DIAGNOSIS — M9903 Segmental and somatic dysfunction of lumbar region: Secondary | ICD-10-CM | POA: Diagnosis not present

## 2017-12-19 DIAGNOSIS — M531 Cervicobrachial syndrome: Secondary | ICD-10-CM | POA: Diagnosis not present

## 2017-12-19 DIAGNOSIS — M9904 Segmental and somatic dysfunction of sacral region: Secondary | ICD-10-CM | POA: Diagnosis not present

## 2017-12-19 DIAGNOSIS — M5136 Other intervertebral disc degeneration, lumbar region: Secondary | ICD-10-CM | POA: Diagnosis not present

## 2017-12-19 DIAGNOSIS — M5137 Other intervertebral disc degeneration, lumbosacral region: Secondary | ICD-10-CM | POA: Diagnosis not present

## 2017-12-19 DIAGNOSIS — M9901 Segmental and somatic dysfunction of cervical region: Secondary | ICD-10-CM | POA: Diagnosis not present

## 2017-12-29 ENCOUNTER — Ambulatory Visit (INDEPENDENT_AMBULATORY_CARE_PROVIDER_SITE_OTHER): Payer: Medicare Other | Admitting: Allergy

## 2017-12-29 ENCOUNTER — Encounter: Payer: Self-pay | Admitting: Allergy

## 2017-12-29 VITALS — BP 148/82 | HR 80 | Temp 98.4°F | Resp 16 | Ht 66.26 in | Wt 200.8 lb

## 2017-12-29 DIAGNOSIS — L253 Unspecified contact dermatitis due to other chemical products: Secondary | ICD-10-CM | POA: Insufficient documentation

## 2017-12-29 DIAGNOSIS — L258 Unspecified contact dermatitis due to other agents: Secondary | ICD-10-CM

## 2017-12-29 MED ORDER — DESONIDE 0.05 % EX CREA: TOPICAL_CREAM | Freq: Two times a day (BID) | CUTANEOUS | 2 refills | Status: DC

## 2017-12-29 NOTE — Progress Notes (Signed)
New Patient Note  RE: Kelsey Church MRN: [id number] DOB: 27-Aug-1950 Date of Office Visit: 12/29/2017  Referring provider: Janith Lima, MD Primary care provider: Janith Lima, MD  Chief Complaint: Rash (Face since March 2019)  History of Present Illness: I had the pleasure of seeing Kelsey Church for initial evaluation at the [facility] on 12/29/2017. She is a 67 y.o. female, who is referred here by Janith Lima, MD for the evaluation of rash.  Rash: Patient started to break out in facial rash in March 2019. It started with dryness on her temples and she used OTC cortisone cream which did not help but did not get worse until July. She was on a trip and had a new blouse with a collar which made her neck itchy. By August 23rd the rash spread to her face and neck. She tried benadryl and zyrtec with minimal benefit. She was staying with her sister who does not have a dog and came back in September. She had a dog for about 1 year but the rash was persistent with or without the dog.  There was concern about periorbital angioedema and PCP started to wean her off medications including losartan/hctz, turmeric, meloxicam but symptoms persisted.   She tried doxepin with no benefit and caused drowsiness. She took some prednisone for bursitis which helped the rash but once done with the steroids the rash came back even worse.   Describes it currently as pruritic. She is washing her face with cool water and using dove sensitive soap. Patient does have history of rosacea however this rash seems different per patient. She has been using a Clinique brand moisturizer and changed to a different product in August for a little while but now back to using [place]. Patient does get her nails done at the same place.  Other than above denies any changes in diet, medications. She does use CPAP machine since 2013 and no change in cleaning products. She tried to stop using her  machine and rash persisted. No history of latex allergy.   Assessment and Plan: Kelsey Church is a 67 y.o. female with: Contact dermatitis Pruritic erythematous rash on face and neck since March 2019 which has been progressively worsening. No triggers noted. Denies any changes in diet, medications or personal care products at onset. Since then she stopped some of her medications with no benefit. 2019 bloodwork - CBC diff, CMP and TSH unremarkable. Doxepin caused drowsiness. Good benefit with prednisone.   Discussed with patient that based on distribution concern for contact dermatitis.  Return for patch testing next week and may bring in [place] she is using to test for as well.   Start zyrtec 10mg  twice a day.   Start desonide cream on the face twice a day up to 1 week at a time.  Discussed proper skin care measures - avoid make up and Clinique products, no fabric softener.   Return for Patch testing.  Meds ordered this encounter  Medications  . desonide (DESOWEN) 0.05 % cream    Sig: Apply topically 2 (two) times daily. On face one week at a time.    Dispense:  60 g    Refill:  2   Other allergy screening: Asthma: no Rhino conjunctivitis: no Food allergy: no Medication allergy: no  Aleve and meloxicam ? Periorbital swelling Hymenoptera allergy: no Urticaria: no Eczema:no History of recurrent infections suggestive of immunodeficency: no  Diagnostics: None  Past Medical History: Patient  Active Problem List   Diagnosis Date Noted  . Contact dermatitis 12/29/2017  . Polyarthralgia 11/02/2017  . 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 Greater trochanteric bursitis of left hip 08/28/2017  . Degenerative lumbar disc 11/01/2016  . 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 Nonspecific abnormal electrocardiogram (ECG) (EKG) 08/27/2015  . Gastroesophageal reflux disease with esophagitis 12/10/2014  . Moderate osteopenia 12/10/2014  . Vitamin B12 deficiency 12/10/2014  . OA  (osteoarthritis) of knee 12/16/2013  . Prediabetes 12/03/2013  . Bursitis of right shoulder 10/04/2013  . Other screening mammogram 09/25/2012  . DJD (degenerative joint disease) of knee 09/09/2011  . OSA (obstructive sleep apnea) 07/29/2011  . Essential hypertension, benign 07/29/2011  . Pure hypercholesterolemia 07/29/2011  . Routine general medical examination at a health care facility 07/29/2011   Past Medical History:  Diagnosis Date  . Arthritis    PAIN AND OA LEFT KNEE; S/P RIGHT TOTAL KNEE ARTHROPLASTY 06/10/13  . Cancer ([place])    melanoma in 2 sights  . Complication of anesthesia    "chills every evening at sundown x 2 weeks"- no fever  . Depression   . Dysphagia   . GERD (gastroesophageal reflux disease)   . History of shingles    NO RESIDUAL PROBLEMS  . Hypertension   . Melanoma in situ ([place])   . Pain    LOWER BACK PAIN  . Sleep apnea 09/2011   CPAP  . Sleep apnea    Past Surgical History: Past Surgical History:  Procedure Laterality Date  . APPENDECTOMY  1958  . COLONOSCOPY    . COLONOSCOPY W/ POLYPECTOMY     7 polyps  . FOOT SURGERY Right    removal plantar wart  . GALLBLADDER SURGERY  02/14/1990  . INJECTION KNEE  09/2011   right   . KNEE ARTHROSCOPY  1996  . MELANOMA EXCISION  1992   right chin, back  . POLYPECTOMY    . SALPINGOOPHORECTOMY  2009   Right  . TOTAL KNEE ARTHROPLASTY Right 06/10/2013   Procedure: RIGHT TOTAL KNEE ARTHROPLASTY;  Surgeon: [name], MD;  Location: [facility];  Service: Orthopedics;  Laterality: Right;  . TOTAL KNEE ARTHROPLASTY Left 12/16/2013   Procedure: LEFT TOTAL KNEE ARTHROPLASTY;  Surgeon: [name], MD;  Location: [facility];  Service: Orthopedics;  Laterality: Left;   Medication List:  Current Outpatient Medications  Medication Sig Dispense Refill  . acetaminophen (TYLENOL) 650 MG CR tablet Take 650 mg by mouth every 8 (eight) hours as needed for pain.    . citalopram (CELEXA) 20 MG tablet TAKE 1 TABLET (20 MG  TOTAL) BY MOUTH AT BEDTIME.--FILL 07-18-2015- 90 tablet 1  . estradiol (CLIMARA - DOSED IN MG/24 HR) 0.05 mg/24hr patch PLACE 1 PATCH (0.05 MG TOTAL) ONTO THE SKIN ONCE A WEEK. 12 patch 2  . furosemide (LASIX) 20 MG tablet Take 1 tablet (20 mg total) by mouth daily. 30 tablet 3  . progesterone (PROMETRIUM) 100 MG capsule Take 1 capsule (100 mg total) by mouth at bedtime. 90 capsule 4  . desonide (DESOWEN) 0.05 % cream Apply topically 2 (two) times daily. On face one week at a time. 60 g 2   No current facility-administered medications for this visit.    Allergies: Allergies  Allergen Reactions  . Aleve [Naproxen Sodium] Swelling    angioedema  . Meloxicam Swelling    angioedema   Social History: Social History   Socioeconomic History  . Marital status: Widowed    Spouse name: Not on  file  . Number of children: 1  . Years of education: Not on file  . Highest education level: Not on file  Occupational History  . Occupation: CMA    Employer: [employer]  . Financial resource strain: Not on file  . Food insecurity:    Worry: Not on file    Inability: Not on file  . Transportation needs:    Medical: Not on file    Non-medical: Not on file  Tobacco Use  . Smoking status: Never Smoker  . Smokeless tobacco: Never Used  Substance and Sexual Activity  . Alcohol use: No    Alcohol/week: 0.0 standard drinks  . Drug use: No  . Sexual activity: Never    Comment: 1st intercourse- 18, partners- 1,   Lifestyle  . Physical activity:    Days per week: Not on file    Minutes per session: Not on file  . Stress: Not on file  Relationships  . Social connections:    Talks on phone: Not on file    Gets together: Not on file    Attends religious service: Not on file    Active member of club or organization: Not on file    Attends meetings of clubs or organizations: Not on file    Relationship status: Not on file  Other Topics Concern  . Not on file  Social History  Narrative   Caffienated drinks-no   Seat belt use often-yes   Regular Exercise-yes   Smoke alarm in the home-yes   Firearms/guns in the home-no   History of physical abuse-no               Lives in a townhome built in early 2000s. Smoking: denies Occupation: retired Health and safety inspector HistoryFreight forwarder in the house: no Charity fundraiser in the family room: no Carpet in the bedroom: yes Heating: electric Cooling: central Pet: yes 1 dog x 1 year  Family History: Family History  Problem Relation Age of Onset  . Heart disease Father   . Heart attack Father   . Hypertension Father   . Osteoporosis Mother   . Breast cancer Mother   . Cancer Maternal Grandmother        Breast and Uterine Cancer  . Breast cancer Maternal Grandmother   . Thyroid cancer Maternal Grandmother   . Hypertension Sister   . Diabetes Sister   . Colon polyps Sister   . Colon cancer Neg Hx   . Rectal cancer Neg Hx   . Stomach cancer Neg Hx   . Esophageal cancer Neg Hx   . Allergic rhinitis Neg Hx   . Asthma Neg Hx   . Eczema Neg Hx   . Urticaria Neg Hx    Problem                               Relation Asthma       No                                Eczema                                No Food allergy  No Allergic rhino conjunctivitis     No  Review of Systems  Constitutional: Negative for appetite change, chills, fever and unexpected weight change.  HENT: Negative for congestion and rhinorrhea.   Eyes: Negative for itching.  Respiratory: Negative for cough, chest tightness, shortness of breath and wheezing.   Cardiovascular: Negative for chest pain.  Gastrointestinal: Negative for abdominal pain.  Genitourinary: Negative for difficulty urinating.  Skin: Positive for rash.  Allergic/Immunologic: Negative for environmental allergies and food allergies.  Neurological: Positive for headaches.   Objective: BP (!) 148/82 (BP Location: Left Arm, Patient Position:  Sitting, Cuff Size: Normal)   Pulse 80   Temp 98.4 F (36.9 C) (Oral)   Resp 16   Ht 5' 6.26" (1.683 m)   Wt 200 lb 12.8 oz (91.1 kg)   SpO2 97%   BMI 32.16 kg/m  Body mass index is 32.16 kg/m. Physical Exam  Constitutional: She is oriented to person, place, and time. She appears well-developed and well-nourished.  HENT:  Head: Normocephalic and atraumatic.  Right Ear: External ear normal.  Left Ear: External ear normal.  Nose: Nose normal.  Mouth/Throat: Oropharynx is clear and moist.  Eyes: Conjunctivae and EOM are normal.  Neck: Neck supple.  Cardiovascular: Normal rate, regular rhythm and normal heart sounds. Exam reveals no gallop and no friction rub.  No murmur heard. Pulmonary/Chest: Effort normal and breath sounds normal. She has no wheezes. She has no rales.  Abdominal: Soft.  Lymphadenopathy:    She has no cervical adenopathy.  Neurological: She is alert and oriented to person, place, and time.  Skin: Skin is warm. Rash noted.  Erythematous patches on the face and anterior neck.   Psychiatric: She has a normal mood and affect. Her behavior is normal.  Nursing note and vitals reviewed.  The plan was reviewed with the patient/family, and all questions/concerned were addressed.  It was my pleasure to see Kelsey Church today and participate in her care. Please feel free to contact me with any questions or concerns.  Sincerely,  Rexene Alberts, DO Allergy & Immunology  [facility] office: [phone] [place]

## 2017-12-29 NOTE — Patient Instructions (Addendum)
   Return for patch testing  Start zyrtec 10mg  twice a day  Start desonide cream on the face twice a day up to 1 week at a time.   Skin care recommendations  Bath time: . Always use lukewarm water. AVOID very hot or cold water. Marland Kitchen Keep bathing time to 5-10 minutes. . Do NOT use bubble bath. . Use a mild soap and use just enough to wash the dirty areas. . Do NOT scrub skin vigorously.  . After bathing, pat dry your skin with a towel. Do NOT rub or scrub the skin.  Moisturizers and prescriptions:  . ALWAYS apply moisturizers immediately after bathing (within 3 minutes). This helps to lock-in moisture. . Use the moisturizer several times a day over the whole body. Kermit Balo summer moisturizers include: Aveeno, CeraVe, Cetaphil. Kermit Balo winter moisturizers include: Aquaphor, Vaseline, Cerave, Cetaphil, Eucerin, Vanicream. . When using moisturizers along with medications, the moisturizer should be applied about one hour after applying the medication to prevent diluting effect of the medication or moisturize around where you applied the medications. When not using medications, the moisturizer can be continued twice daily as maintenance.  Laundry and clothing: . Avoid laundry products with added color or perfumes. . Use unscented hypo-allergenic laundry products such as Tide free, Cheer free & gentle, and All free and clear.  . If the skin still seems dry or sensitive, you can try double-rinsing the clothes. . Avoid tight or scratchy clothing such as wool. . Do not use fabric softeners or dyer sheets.

## 2017-12-29 NOTE — Assessment & Plan Note (Signed)
Pruritic erythematous rash on face and neck since March 2019 which has been progressively worsening. No triggers noted. Denies any changes in diet, medications or personal care products at onset. Since then she stopped some of her medications with no benefit. 2019 bloodwork - CBC diff, CMP and TSH unremarkable. Doxepin caused drowsiness. Good benefit with prednisone.   Discussed with patient that based on distribution concern for contact dermatitis.  Return for patch testing next week and may bring in [place] she is using to test for as well.   Start zyrtec 10mg  twice a day.   Start desonide cream on the face twice a day up to 1 week at a time.  Discussed proper skin care measures - avoid make up and Clinique products, no fabric softener.

## 2018-01-01 ENCOUNTER — Encounter: Payer: Self-pay | Admitting: Family Medicine

## 2018-01-01 ENCOUNTER — Ambulatory Visit (INDEPENDENT_AMBULATORY_CARE_PROVIDER_SITE_OTHER): Payer: Medicare Other | Admitting: Family Medicine

## 2018-01-01 ENCOUNTER — Encounter: Payer: Self-pay | Admitting: Internal Medicine

## 2018-01-01 ENCOUNTER — Telehealth: Payer: Self-pay | Admitting: Allergy

## 2018-01-01 DIAGNOSIS — L253 Unspecified contact dermatitis due to other chemical products: Secondary | ICD-10-CM

## 2018-01-01 NOTE — Progress Notes (Signed)
  [address] [place] [address] Dept: [phone]  FOLLOW UP NOTE  Patient ID: Kelsey Church, female    DOB: [birth year]  Age: 67 y.o. MRN: [id number] Date of Office Visit: 01/01/2018  Assessment  Chief Complaint: Patch Testing (true patch testing)  HPI Kelsey Church is a 67 year old who presents to the clinic for patch placement for contact dermatitis. She reports feeling well today. She brought 6 items that she uses on a regular basis to test in addition to the True Test kit.    Drug Allergies:  Allergies  Allergen Reactions  . Aleve [Naproxen Sodium] Swelling    angioedema  . Meloxicam Swelling    angioedema    Physical Exam: There were no vitals taken for this visit.   Physical Exam  Constitutional: She is oriented to person, place, and time. She appears well-developed.  Eyes: Conjunctivae are normal.  Neck: Normal range of motion. Neck supple.  Cardiovascular: Normal rate, regular rhythm and normal heart sounds.  No murmur heard. Pulmonary/Chest: Effort normal and breath sounds normal.  Lungs clear to auscultation  Musculoskeletal: Normal range of motion.  Neurological: She is alert and oriented to person, place, and time.  Skin: Skin is warm and dry.  Erythematous patches around her mouth, chin, nose, and anterior neck  Psychiatric: She has a normal mood and affect. Her behavior is normal. Judgment and thought content normal.    Diagnostics: Tru Test patches placed as well as 6 additional wells with personal care items.  Assessment and Plan: 1. Contact dermatitis due to chemicals      Patient Instructions   True patch test placed as well as 6 personal care items   The patient was instructed regarding proper care of the patches for the next 48 hours. Do not get patches wet - avoid showering until the next visit. Do not engage in vigorous physical activity.   Patient will follow up in 48 hours and 96 hours for patch readings.  Call the  clinic with any questions  Follow up in 2 days or sooner if needed   Return in about 2 days (around 01/03/2018), or if symptoms worsen or fail to improve.   Thank you for the opportunity to care for this patient.  Please do not hesitate to contact me with questions.  Gareth Morgan, FNP [facility]  I have provided oversight concerning Gareth Morgan' evaluation and treatment of this patient's health issues addressed during today's encounter. I agree with the assessment and therapeutic plan as outlined in the note.   Thank you for the opportunity to care for this patient.  Please do not hesitate to contact me with questions.  Penne Lash, M.D.  [facility] [address] [place], [place] [address] [phone]

## 2018-01-01 NOTE — Patient Instructions (Addendum)
   True patch test placed as well as 6 personal care items   The patient was instructed regarding proper care of the patches for the next 48 hours. Do not get patches wet - avoid showering until the next visit. Do not engage in vigorous physical activity.   Patient will follow up in 48 hours and 96 hours for patch readings.  Call the clinic with any questions  Follow up in 2 days or sooner if needed

## 2018-01-01 NOTE — Telephone Encounter (Signed)
Pharmacy called and said Desonide 0.05% cream was not on patient formulary .Suggested alternatives are Hydrocortisone 1%cream,Betamethasone VA 0.1% cream, Triamcinolone 0.25% cream or Oint. Pllease advise Thank you.

## 2018-01-01 NOTE — Telephone Encounter (Signed)
Please send in hydrocortisone 1% cream twice a day. May use 1 week at a time for flares.  Thank you.

## 2018-01-02 ENCOUNTER — Other Ambulatory Visit: Payer: Self-pay | Admitting: Allergy

## 2018-01-02 ENCOUNTER — Telehealth: Payer: Self-pay | Admitting: Allergy

## 2018-01-02 MED ORDER — HYDROCORTISONE 1 % EX CREA: 1.0000 "application " | TOPICAL_CREAM | Freq: Two times a day (BID) | CUTANEOUS | 1 refills | Status: DC

## 2018-01-02 NOTE — Telephone Encounter (Signed)
Called in rx and will  informe patient.

## 2018-01-02 NOTE — Telephone Encounter (Signed)
Informed patient

## 2018-01-02 NOTE — Telephone Encounter (Signed)
Informed patient we had changed the DESONIDE 0.05 % to Hydrocortisone 1%

## 2018-01-03 ENCOUNTER — Encounter: Payer: Self-pay | Admitting: Family Medicine

## 2018-01-03 ENCOUNTER — Ambulatory Visit: Payer: Medicare Other | Admitting: Family Medicine

## 2018-01-03 DIAGNOSIS — L253 Unspecified contact dermatitis due to other chemical products: Secondary | ICD-10-CM

## 2018-01-03 NOTE — Progress Notes (Addendum)
    Follow-up Note  RE: Kelsey Church MRN: [id number] DOB: 1950-05-31 Date of Office Visit: 01/03/2018  Primary care provider: Janith Lima, MD Referring provider: Janith Lima, MD   Sherah returns to the office today for the initial patch test interpretation, given suspected history of contact dermatitis.    Diagnostics:   TRUE TEST 48-hour hour reading: weak possible reaction to #5 [employer] mix), weak possible reaction to #10 (Balsam of [place]) and weak possible reaction to #33 (Bacitracin) and strong positive to Pantene shampoo.  Plan:   Allergic contact dermatitis - The patient has been provided detailed information regarding the substances she is sensitive to, as well as products containing the substances.   - Meticulous avoidance of these substances is recommended.  - If avoidance is not possible, the use of barrier creams or lotions is recommended. - If symptoms persist or progress despite meticulous avoidance of the above substances, Dermatology Referral may be warranted.  Follow up in 2 days for patch test interpretation. Thank you for the opportunity to care for this patient.  Please do not hesitate to contact me with questions.  Gareth Morgan, FNP [facility]  _________________________________________________  I have provided oversight concerning [facility]'s evaluation and treatment of this patient's health issues addressed during today's encounter.  I agree with the assessment and therapeutic plan as outlined in the note.   Signed,   R Edgar Frisk, MD

## 2018-01-03 NOTE — Patient Instructions (Signed)
Plan:   Allergic contact dermatitis - The patient has been provided detailed information regarding the substances she is sensitive to, as well as products containing the substances.   - Meticulous avoidance of these substances is recommended.  - If avoidance is not possible, the use of barrier creams or lotions is recommended. - If symptoms persist or progress despite meticulous avoidance of the above substances, Dermatology Referral may be warranted.  Follow up in 2 days or sooner if needed

## 2018-01-05 ENCOUNTER — Ambulatory Visit: Payer: Medicare Other | Admitting: Allergy

## 2018-01-05 DIAGNOSIS — L253 Unspecified contact dermatitis due to other chemical products: Secondary | ICD-10-CM

## 2018-01-05 MED ORDER — FLUTICASONE PROPIONATE 0.05 % EX CREA: TOPICAL_CREAM | Freq: Two times a day (BID) | CUTANEOUS | 0 refills | Status: DC

## 2018-01-05 MED ORDER — MOMETASONE FUROATE 0.1 % EX CREA: 1.0000 "application " | TOPICAL_CREAM | Freq: Two times a day (BID) | CUTANEOUS | 2 refills | Status: DC

## 2018-01-05 NOTE — Patient Instructions (Addendum)
You can access your safe list at the following website: [email or web address].[email or web address]  Click on patient Accept user agreement Your search codes 1 is: [id number] Your search codes 2 is: [id number] Click on free trial  This will generate a file with all the products that are safe for you to use.  Start zyrtec 10mg  twice a day.  Stop desonide cream Use mometasone on body (below neck) twice a day up to 2 weeks at a time as needed for flares. Use fluticasone on face and neck twice a day up to 1 week at a time as needed for flares.  Avoid any nail polish.  Avoid the following items: Panel 1: #5 [place] Mix:1, #8 Paraben Mix:1 and #10 Balsam of [place]:1 Panel 2: None Panel 3: #26 Quinoline Mix:1, #30 Budesonide:1 and #33 Bacitracin:1   Extra panel: positive to clinique redness solution foundation, pantene shampoo and olay cleanse gentle facial cloths.  Follow up in 3 months  Skin care recommendations  Bath time: . Always use lukewarm water. AVOID very hot or cold water. Marland Kitchen Keep bathing time to 5-10 minutes. . Do NOT use bubble bath. . Use a mild soap and use just enough to wash the dirty areas. . Do NOT scrub skin vigorously.  . After bathing, pat dry your skin with a towel. Do NOT rub or scrub the skin.  Moisturizers and prescriptions:  . ALWAYS apply moisturizers immediately after bathing (within 3 minutes). This helps to lock-in moisture. . Use the moisturizer several times a day over the whole body. Kermit Balo summer moisturizers include: Aveeno, CeraVe, Cetaphil. Kermit Balo winter moisturizers include: Aquaphor, Vaseline, Cerave, Cetaphil, Eucerin, Vanicream. . When using moisturizers along with medications, the moisturizer should be applied about one hour after applying the medication to prevent diluting effect of the medication or moisturize around where you applied the medications. When not using medications, the moisturizer can be continued twice daily as maintenance.  Laundry and  clothing: . Avoid laundry products with added color or perfumes. . Use unscented hypo-allergenic laundry products such as Tide free, Cheer free & gentle, and All free and clear.  . If the skin still seems dry or sensitive, you can try double-rinsing the clothes. . Avoid tight or scratchy clothing such as wool. . Do not use fabric softeners or dyer sheets.

## 2018-01-05 NOTE — Progress Notes (Signed)
Follow Up Note  RE: Kelsey Church MRN: [id number] DOB: [birth year] Date of Office Visit: 01/05/2018  Referring provider: Janith Lima, MD Primary care provider: Janith Lima, MD  History of Present Illness: I had the pleasure of seeing Kelsey Church for a follow up visit at the [facility] on 01/05/2018. She is a 67 y.o. female, who is being followed for contact dermatitis. Today she is here for final patch test interpretation, given suspected history of contact dermatitis.   Diagnostics:  TRUE TEST 96 hour reading:  T.R.U.E. Test - 01/05/18 1100    Time Antigen Placed  [place]  --   [facility] [place] [facility]   Lot #  [id number]    Location  Back    Number of Test  36    Reading Interval  --   Patch placement   1. Nickel Sulfate  0    2. Wool Alcohols  0    3. Neomycin Sulfate  0    4. Potassium Dichromate  0    5. Caine Mix  0    6. Fragrance Mix  0    7. Colophony  0    8. Paraben Mix  1    9. Negative Control  0    10. [place]  1    11. Ethylenediamine Dihydrochloride  0    12. Cobalt Dichloride  0    13. p-tert Butylphenol Formaldehyde Resin  0    14. Epoxy Resin  0    15. Carba Mix  0    16.  Black Rubber Mix  0    17. Cl+ Me-Isothiazolinone  0    18. Quaternium-15  0    19. Methyldibromo Glutaronitrile  0    20. p-Phenylenediamine  0    21. Formaldehyde  0    22. Mercapto Mix  0    23. Thimerosal  0    24. Thiuram Mix  0    25. Diazolidinyl Urea  0    26. Quinoline Mix  1    27. Tixocortol-21-Pivalate  0    28. Gold Sodium Thiosulfate  0    29. Imidazolidinyl Urea  0    30. Budesonide  0    31. Hydrocortisone-17-Butyrate  0    32. Mercaptobenzothiazole  0    33. Bacitracin  0    34. Parthenolide  0    35. Disperse Blue 106  0    36. 2-Bromo-2-Nitropropane-1,3-diol  0      Panel 1: #5 Caine Mix:1, #8 Paraben Mix:1 and #10 Balsam of [place]:1  Panel 2: None  Panel 3: #26 Quinoline Mix:1, #30 Budesonide:1 and  #33 Bacitracin:1   Extra panel: positive to clinique redness solution foundation, pantene shampoo and olay cleanse gentle facial cloths.  Assessment and Plan: Kelsey Church is a 67 y.o. female with: Contact dermatitis due to chemicals Past history - Pruritic erythematous rash on face and neck since March 2019 which has been progressively worsening. No triggers noted. Denies any changes in diet, medications or personal care products at onset. Since then she stopped some of her medications with no benefit. 2019 bloodwork - CBC diff, CMP and TSH unremarkable. Doxepin caused drowsiness. Good benefit with prednisone.  Interim history - rash still the same Patch testing positive to  Panel 1: #5 Caine Mix:1, #8 Paraben Mix:1 and #10 Balsam of [place]:1, Panel 3: #26 Quinoline Mix:1, #30 Budesonide:1 and #33 Bacitracin:1; Extra  panel: positive to clinique redness solution foundation, pantene shampoo and olay cleanse gentle facial cloths.  Emailed safe list to patient and educated her on avoidance.  Start zyrtec 10mg  twice a day.   Stop desonide cream  Use mometasone on body (below neck) twice a day up to 2 weeks at a time as needed for flares.  Use fluticasone on face and neck twice a day up to 1 week at a time as needed for flares.  Avoid any nail polish.   The patient has been provided detailed information regarding the substances she is sensitive to, as well as products containing the substances.  Meticulous avoidance of these substances is recommended. If avoidance is not possible, the use of barrier creams or lotions is recommended.  If symptoms persist or progress despite meticulous avoidance of chemicals/substances above, dermatology evaluation may be warranted.  Return in about 3 months (around 04/07/2018).  It was my pleasure to see Kelsey Church today and participate in her care. Please feel free to contact me with any questions or concerns.  Sincerely,  Rexene Alberts, DO Allergy &  Immunology  [facility] office: [phone] [place]

## 2018-01-05 NOTE — Assessment & Plan Note (Signed)
Past history - Pruritic erythematous rash on face and neck since March 2019 which has been progressively worsening. No triggers noted. Denies any changes in diet, medications or personal care products at onset. Since then she stopped some of her medications with no benefit. 2019 bloodwork - CBC diff, CMP and TSH unremarkable. Doxepin caused drowsiness. Good benefit with prednisone.  Interim history - rash still the same Patch testing positive to  Panel 1: #5 Caine Mix:1, #8 Paraben Mix:1 and #10 Balsam of [place]:1, Panel 3: #26 Quinoline Mix:1, #30 Budesonide:1 and #33 Bacitracin:1; Extra panel: positive to clinique redness solution foundation, pantene shampoo and olay cleanse gentle facial cloths.  Emailed safe list to patient and educated her on avoidance.  Start zyrtec 10mg  twice a day.   Stop desonide cream  Use mometasone on body (below neck) twice a day up to 2 weeks at a time as needed for flares.  Use fluticasone on face and neck twice a day up to 1 week at a time as needed for flares.  Avoid any nail polish.

## 2018-01-17 ENCOUNTER — Encounter: Payer: Self-pay | Admitting: Family Medicine

## 2018-01-18 NOTE — Progress Notes (Signed)
[name] [address] [place], [place] [address] Phone: [phone] Subjective:   Fontaine No, am serving as a scribe for Dr. Hulan Saas.   CC: right shoulder pain   CHE:NIDPOEUMPN  Kelsey Church is a 67 y.o. female coming in with complaint of right shoulder pain. She has pain over the entire joint. Is unable to sleep due to pain. Pain radiates down into the tricep.  Worse recently, does not know why, does travel a lot       Past Medical History:  Diagnosis Date  . Arthritis    PAIN AND OA LEFT KNEE; S/P RIGHT TOTAL KNEE ARTHROPLASTY 06/10/13  . Cancer ([place])    melanoma in 2 sights  . Complication of anesthesia    "chills every evening at sundown x 2 weeks"- no fever  . Depression   . Dysphagia   . GERD (gastroesophageal reflux disease)   . History of shingles    NO RESIDUAL PROBLEMS  . Hypertension   . Melanoma in situ ([place])   . Pain    LOWER BACK PAIN  . Sleep apnea 09/2011   CPAP  . Sleep apnea    Past Surgical History:  Procedure Laterality Date  . APPENDECTOMY  1958  . COLONOSCOPY    . COLONOSCOPY W/ POLYPECTOMY     7 polyps  . FOOT SURGERY Right    removal plantar wart  . GALLBLADDER SURGERY  02/14/1990  . INJECTION KNEE  09/2011   right   . KNEE ARTHROSCOPY  1996  . MELANOMA EXCISION  1992   right chin, back  . POLYPECTOMY    . SALPINGOOPHORECTOMY  2009   Right  . TOTAL KNEE ARTHROPLASTY Right 06/10/2013   Procedure: RIGHT TOTAL KNEE ARTHROPLASTY;  Surgeon: [name], MD;  Location: [facility];  Service: Orthopedics;  Laterality: Right;  . TOTAL KNEE ARTHROPLASTY Left 12/16/2013   Procedure: LEFT TOTAL KNEE ARTHROPLASTY;  Surgeon: [name], MD;  Location: [facility];  Service: Orthopedics;  Laterality: Left;   Social History   Socioeconomic History  . Marital status: Widowed    Spouse name: Not on file  . Number of children: 1  . Years of education: Not on file  . Highest education level: Not on file    Occupational History  . Occupation: CMA    Employer: [employer]  . Financial resource strain: Not on file  . Food insecurity:    Worry: Not on file    Inability: Not on file  . Transportation needs:    Medical: Not on file    Non-medical: Not on file  Tobacco Use  . Smoking status: Never Smoker  . Smokeless tobacco: Never Used  Substance and Sexual Activity  . Alcohol use: No    Alcohol/week: 0.0 standard drinks  . Drug use: No  . Sexual activity: Never    Comment: 1st intercourse- 18, partners- 1,   Lifestyle  . Physical activity:    Days per week: Not on file    Minutes per session: Not on file  . Stress: Not on file  Relationships  . Social connections:    Talks on phone: Not on file    Gets together: Not on file    Attends religious service: Not on file    Active member of club or organization: Not on file    Attends meetings of clubs or organizations: Not on file    Relationship status:  Not on file  Other Topics Concern  . Not on file  Social History Narrative   Caffienated drinks-no   Seat belt use often-yes   Regular Exercise-yes   Smoke alarm in the home-yes   Firearms/guns in the home-no   History of physical abuse-no               Allergies  Allergen Reactions  . Aleve [Naproxen Sodium] Swelling    angioedema  . Meloxicam Swelling    angioedema   Family History  Problem Relation Age of Onset  . Heart disease Father   . Heart attack Father   . Hypertension Father   . Osteoporosis Mother   . Breast cancer Mother   . Cancer Maternal Grandmother        Breast and Uterine Cancer  . Breast cancer Maternal Grandmother   . Thyroid cancer Maternal Grandmother   . Hypertension Sister   . Diabetes Sister   . Colon polyps Sister   . Colon cancer Neg Hx   . Rectal cancer Neg Hx   . Stomach cancer Neg Hx   . Esophageal cancer Neg Hx   . Allergic rhinitis Neg Hx   . Asthma Neg Hx   . Eczema Neg Hx   . Urticaria Neg Hx      Current Outpatient Medications (Endocrine & Metabolic):  .  estradiol (CLIMARA - DOSED IN MG/24 HR) 0.05 mg/24hr patch, PLACE 1 PATCH (0.05 MG TOTAL) ONTO THE SKIN ONCE A WEEK. .  progesterone (PROMETRIUM) 100 MG capsule, Take 1 capsule (100 mg total) by mouth at bedtime.  Current Outpatient Medications (Cardiovascular):  .  furosemide (LASIX) 20 MG tablet, Take 1 tablet (20 mg total) by mouth daily.   Current Outpatient Medications (Analgesics):  .  acetaminophen (TYLENOL) 650 MG CR tablet, Take 650 mg by mouth every 8 (eight) hours as needed for pain.   Current Outpatient Medications (Other):  .  citalopram (CELEXA) 20 MG tablet, TAKE 1 TABLET (20 MG TOTAL) BY MOUTH AT BEDTIME.--FILL 07-18-2015- .  fluticasone (CUTIVATE) 0.05 % cream, Apply topically 2 (two) times daily. May use on the neck and face twice a day up to 1 week at a time as needed for flares. .  mometasone (ELOCON) 0.1 % cream, Apply 1 application topically 2 (two) times daily. May use on body (below neck) twice a day up to 2 weeks at a time for flares as needed.    Past medical history, social, surgical and family history all reviewed in electronic medical record.  No pertanent information unless stated regarding to the chief complaint.   Review of Systems:  No headache, visual changes, nausea, vomiting, diarrhea, constipation, dizziness, abdominal pain, skin rash, fevers, chills, night sweats, weight loss, swollen lymph nodes, body aches, joint swelling, muscle aches, chest pain, shortness of breath, mood changes.   Objective  There were no vitals taken for this visit. Systems examined below as of    General: No apparent distress alert and oriented x3 mood and affect normal, dressed appropriately.  HEENT: Pupils equal, extraocular movements intact  Respiratory: Patient's speak in full sentences and does not appear short of breath  Cardiovascular: No lower extremity edema, non tender, no erythema  Skin: Warm dry  intact with no signs of infection or rash on extremities or on axial skeleton.  Abdomen: Soft nontender  Neuro: Cranial nerves II through XII are intact, neurovascularly intact in all extremities with 2+ DTRs and 2+ pulses.  Lymph: No lymphadenopathy of  posterior or anterior cervical chain or axillae bilaterally.  Gait normal with good balance and coordination.  MSK:  Non tender with full range of motion and good stability and symmetric strength and tone of  elbows, wrist, hip, knee and ankles bilaterally.  Shoulder: Right Inspection reveals no abnormalities, atrophy or asymmetry. Palpation is normal with no tenderness over AC joint or bicipital groove. ROM is full in all planes passively. Rotator cuff strength normal throughout. signs of impingement with positive Neer and Hawkin's tests, but negative empty can sign. Speeds and Yergason's tests normal. No labral pathology noted with negative Obrien's, negative clunk and good stability. Normal scapular function observed. No painful arc and no drop arm sign. No apprehension sign  MSK US performed of: Right This study was ordered, performed, and interpreted by Charlann Boxer D.O.  Shoulder:   Supraspinatus:  Appears normal on long and transverse views, Bursal bulge seen with shoulder abduction on impingement view. Infraspinatus:  Appears normal on long and transverse views. Significant increase in Doppler flow Subscapularis:  Appears normal on long and transverse views. Positive bursa Teres Minor:  Appears normal on long and transverse views. AC joint:  Capsule undistended, no geyser sign. Glenohumeral Joint:  Appears normal without effusion. Glenoid Labrum:  Intact without visualized tears. Biceps Tendon:  Appears normal on long and transverse views, no fraying of tendon, tendon located in intertubercular groove, no subluxation with shoulder internal or external rotation.  Impression: Subacromial bursitis  Procedure: Real-time Ultrasound  Guided Injection of right glenohumeral joint Device: GE Logiq E  Ultrasound guided injection is preferred based studies that show increased duration, increased effect, greater accuracy, decreased procedural pain, increased response rate with ultrasound guided versus blind injection.  Verbal informed consent obtained.  Time-out conducted.  Noted no overlying erythema, induration, or other signs of local infection.  Skin prepped in a sterile fashion.  Local anesthesia: Topical Ethyl chloride.  With sterile technique and under real time ultrasound guidance:  Joint visualized.  23g 1  inch needle inserted posterior approach. Pictures taken for needle placement. Patient did have injection of 2 cc of 1% lidocaine, 2 cc of 0.5% Marcaine, and 1.0 cc of Kenalog 40 mg/dL. Completed without difficulty  Pain immediately resolved suggesting accurate placement of the medication.  Advised to call if fevers/chills, erythema, induration, drainage, or persistent bleeding.  Images permanently stored and available for review in the ultrasound unit.  Impression: Technically successful ultrasound guided injection.      Impression and Recommendations:     . The above documentation has been reviewed and is accurate and complete Kelsey Pulley, DO       Note: This dictation was prepared with Dragon dictation along with smaller phrase technology. Any transcriptional errors that result from this process are unintentional.

## 2018-01-19 ENCOUNTER — Ambulatory Visit (INDEPENDENT_AMBULATORY_CARE_PROVIDER_SITE_OTHER)
Admission: RE | Admit: 2018-01-19 | Discharge: 2018-01-19 | Disposition: A | Discharge status: Home / Self Care | Payer: Medicare Other | Source: Ambulatory Visit | Attending: Family Medicine | Admitting: Family Medicine

## 2018-01-19 ENCOUNTER — Ambulatory Visit (INDEPENDENT_AMBULATORY_CARE_PROVIDER_SITE_OTHER): Payer: Medicare Other | Admitting: Family Medicine

## 2018-01-19 ENCOUNTER — Encounter: Payer: Self-pay | Admitting: Family Medicine

## 2018-01-19 ENCOUNTER — Ambulatory Visit: Payer: Self-pay

## 2018-01-19 VITALS — BP 126/82 | HR 85 | Ht 66.0 in | Wt 200.0 lb

## 2018-01-19 DIAGNOSIS — M7551 Bursitis of right shoulder: Secondary | ICD-10-CM

## 2018-01-19 DIAGNOSIS — G8929 Other chronic pain: Secondary | ICD-10-CM

## 2018-01-19 DIAGNOSIS — M25511 Pain in right shoulder: Principal | ICD-10-CM

## 2018-01-19 DIAGNOSIS — M47812 Spondylosis without myelopathy or radiculopathy, cervical region: Secondary | ICD-10-CM | POA: Diagnosis not present

## 2018-01-19 IMAGING — DX DG CERVICAL SPINE 2 OR 3 VIEWS
4 series · 5 of 5 positions shown · non-contrast
Comparison: None.

CLINICAL DATA: RIGHT-sided neck and shoulder pain for 7 days.

EXAM:
CERVICAL SPINE - 2-3 VIEW

[c-spine lat]
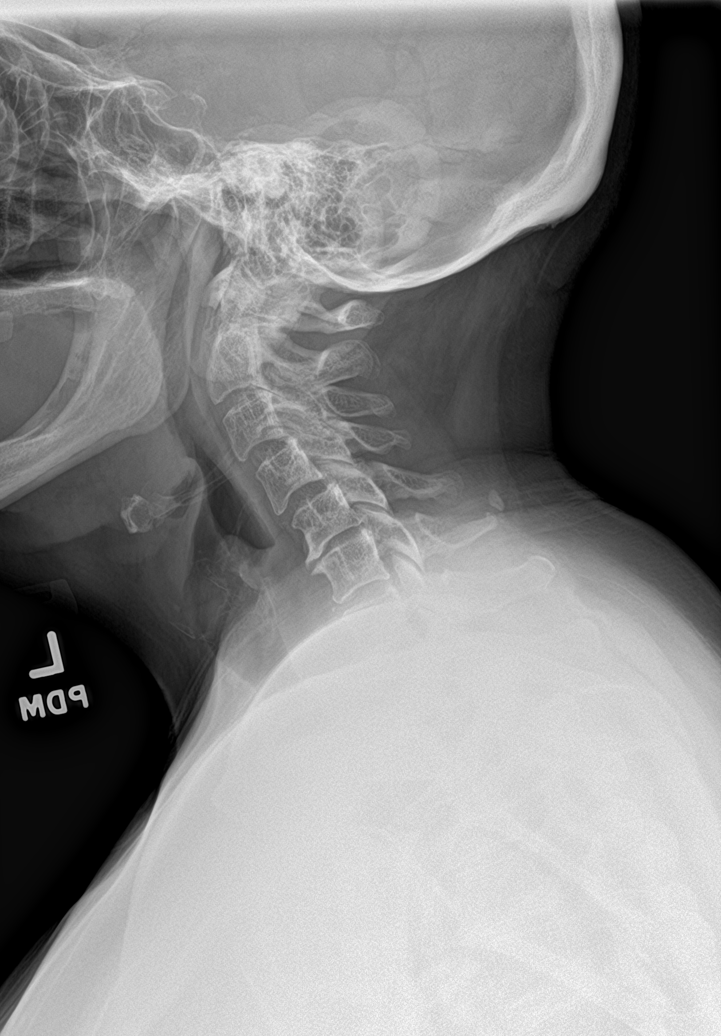

[c-spine ap]
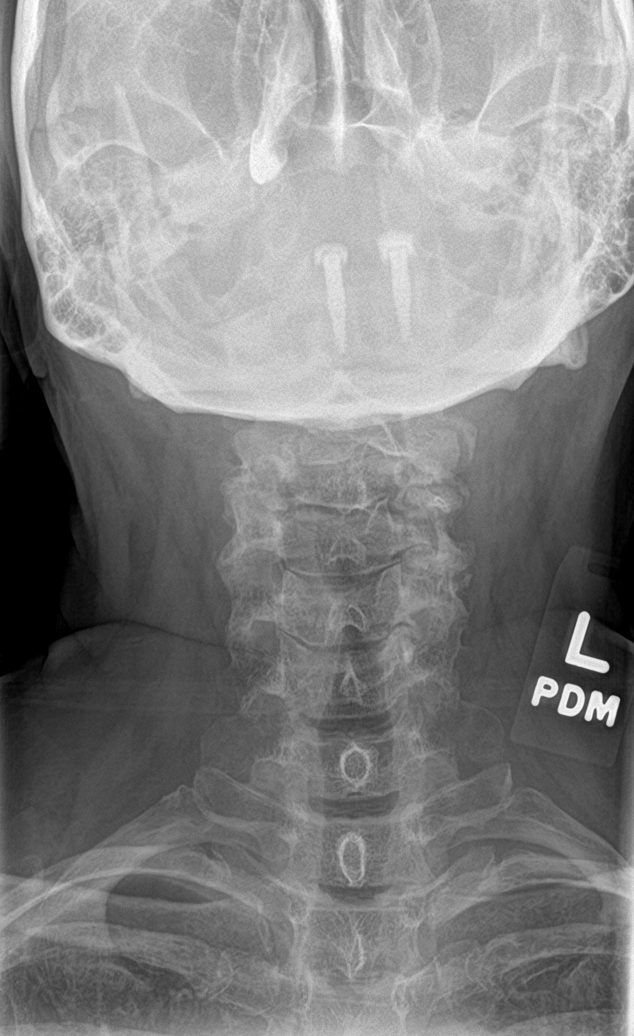

[Series 3: c-spine open mouth · 0.14mm/px · 2 of 2 slices shown]
[im 1/2]
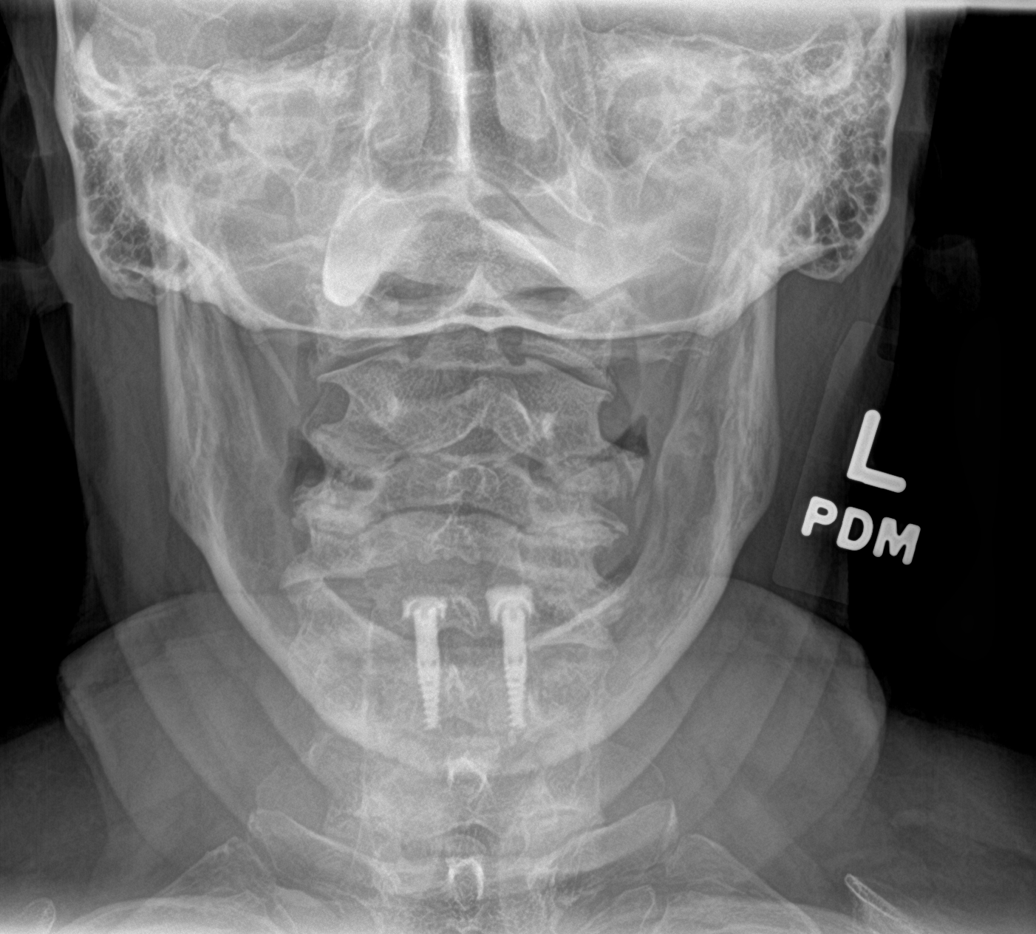
[im 2/2]
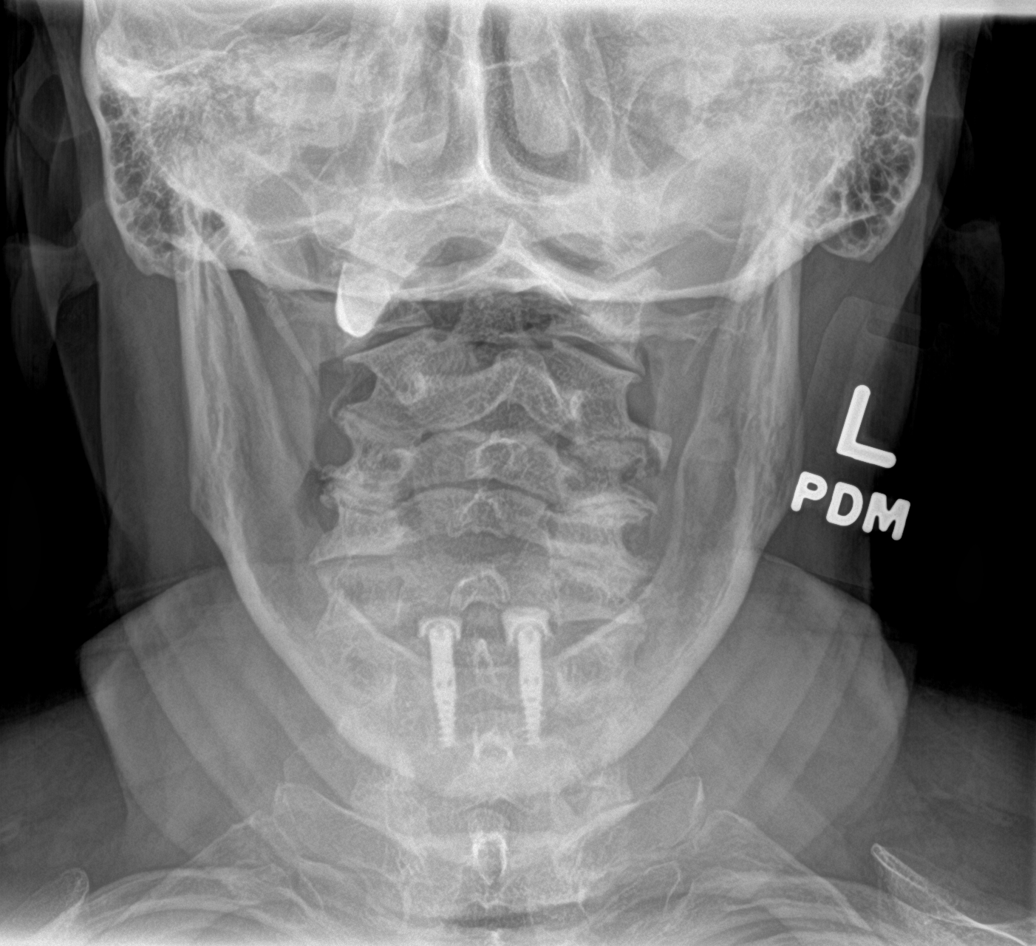

[swimmer]
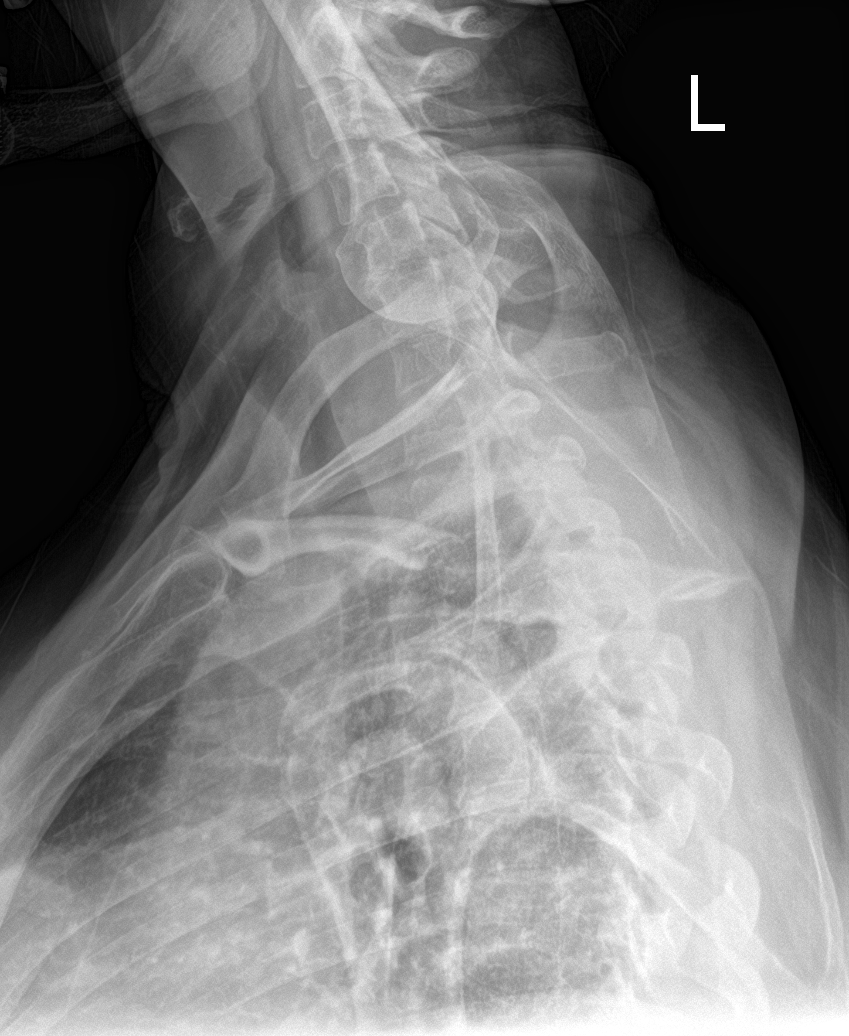

[5 of 5 positions shown; findings below may reference images not displayed]

FINDINGS: Mild anterolisthesis of C4 on C5, related to degenerative
spondylosis in the lower cervical spine. No evidence of acute
vertebral body subluxation. No acute or suspicious osseous lesion.
No fracture line or displaced fracture fragment.

Mild degenerative spondylosis within the mid and lower cervical
spine, with associated mild disc space narrowings, mild osseous
spurring and uncovertebral joint hypertrophy. Visualized
paravertebral soft tissues are unremarkable. Prevertebral soft
tissues are normal in thickness.
IMPRESSION: 1. Mild degenerative change within the mid and lower cervical spine.
2. No acute findings.

## 2018-01-19 IMAGING — DX DG SHOULDER 2+V*R*
3 series · 3 of 3 positions shown · non-contrast
Comparison: None.

CLINICAL DATA: RIGHT-sided neck and shoulder pain for 7 days.

EXAM:
RIGHT SHOULDER - 2+ VIEW

[grashey]
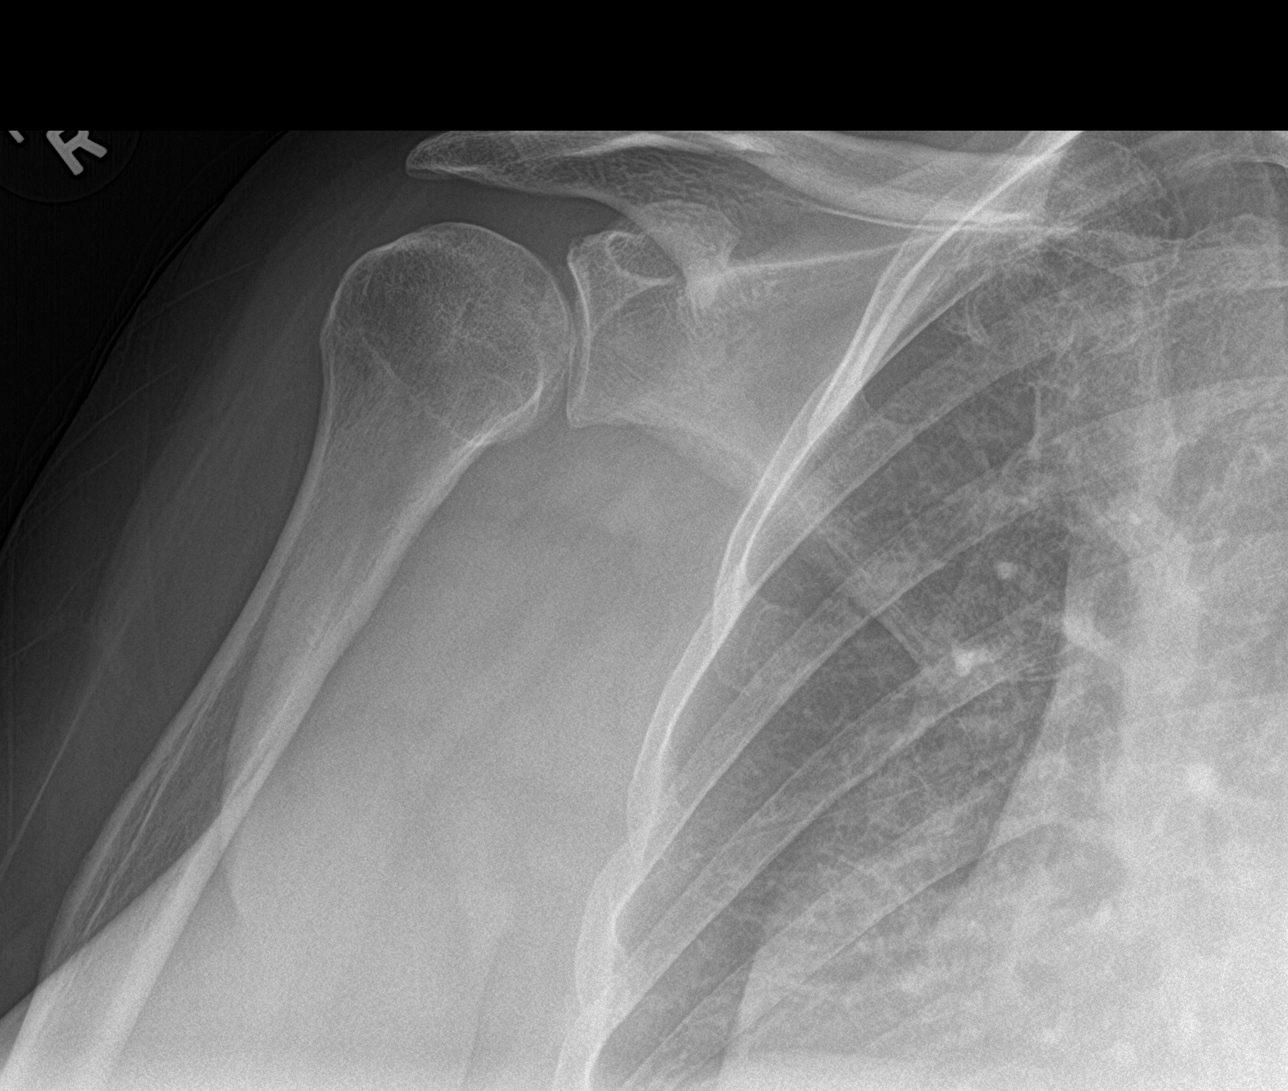

[y view]
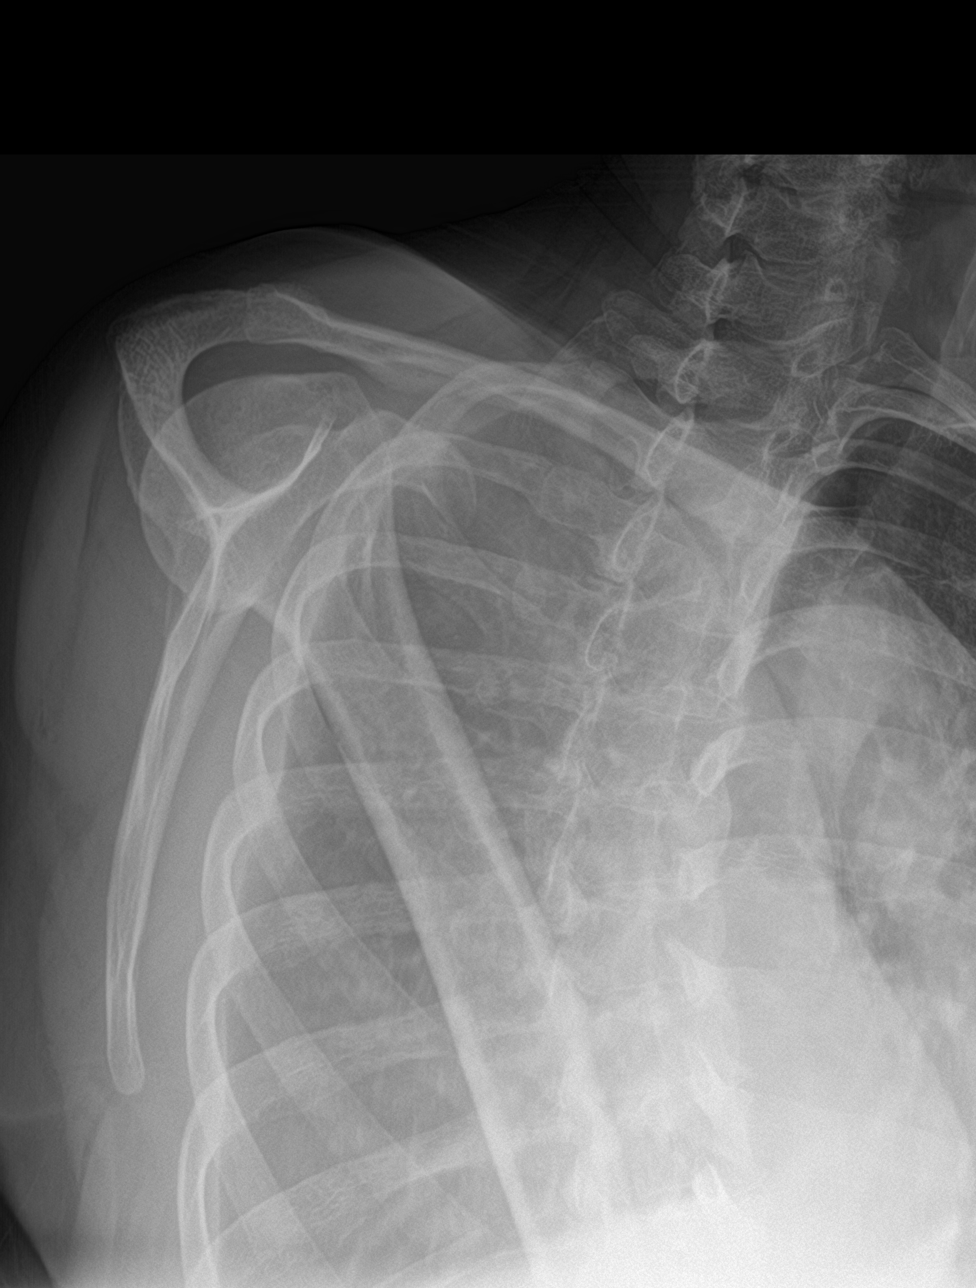

[shoulder axial]
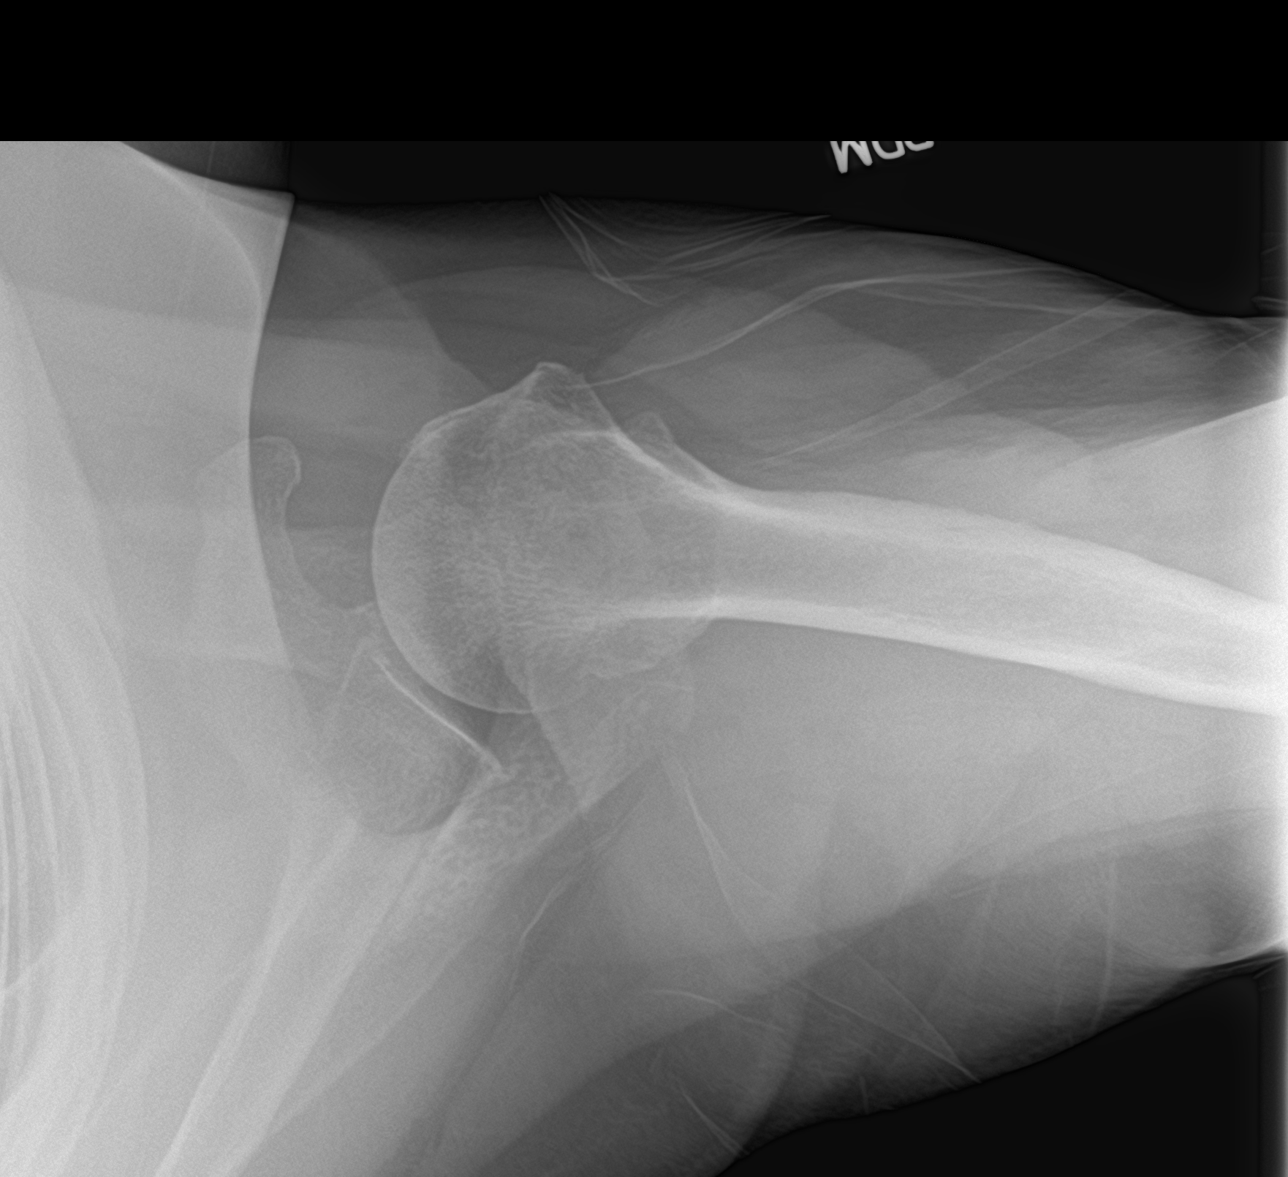

[3 of 3 positions shown; findings below may reference images not displayed]

FINDINGS: There is no evidence of fracture or dislocation. There is no
evidence of arthropathy or other focal bone abnormality. Soft
tissues are unremarkable.
IMPRESSION: Negative.

## 2018-01-19 NOTE — Assessment & Plan Note (Signed)
Patient given injection.  Tolerated the procedure well.  Discussed icing regimen and home exercise.  Discussed which activities to doing which wants to avoid.  Patient if worsening symptoms will need advanced imaging.  X-rays ordered today as well.

## 2018-01-19 NOTE — Patient Instructions (Addendum)
God to see you  Kelsey Church is your friend Xrays downstairs Injected again  Stay active but keep hands within peripheral vison  If not a lot better  Call on Monday or Tuesday and we will order MRI  If doing well then want ot see you again in 4 weeks

## 2018-01-22 ENCOUNTER — Ambulatory Visit (INDEPENDENT_AMBULATORY_CARE_PROVIDER_SITE_OTHER): Payer: Medicare Other | Admitting: Obstetrics & Gynecology

## 2018-01-22 ENCOUNTER — Encounter: Payer: Self-pay | Admitting: Obstetrics & Gynecology

## 2018-01-22 VITALS — BP 124/78 | Ht 65.0 in | Wt 202.0 lb

## 2018-01-22 DIAGNOSIS — Z01419 Encounter for gynecological examination (general) (routine) without abnormal findings: Secondary | ICD-10-CM | POA: Diagnosis not present

## 2018-01-22 DIAGNOSIS — Z79890 Hormone replacement therapy (postmenopausal): Secondary | ICD-10-CM | POA: Diagnosis not present

## 2018-01-22 DIAGNOSIS — D219 Benign neoplasm of connective and other soft tissue, unspecified: Secondary | ICD-10-CM

## 2018-01-22 DIAGNOSIS — E6609 Other obesity due to excess calories: Secondary | ICD-10-CM

## 2018-01-22 DIAGNOSIS — Z6833 Body mass index (BMI) 33.0-33.9, adult: Secondary | ICD-10-CM | POA: Diagnosis not present

## 2018-01-22 DIAGNOSIS — N951 Menopausal and female climacteric states: Secondary | ICD-10-CM

## 2018-01-22 MED ORDER — PROGESTERONE MICRONIZED 100 MG PO CAPS: 100.0000 mg | ORAL_CAPSULE | Freq: Every day | ORAL | 4 refills | Status: DC

## 2018-01-22 MED ORDER — ESTRADIOL 0.05 MG/24HR TD PTWK: 0.0500 mg | MEDICATED_PATCH | TRANSDERMAL | 4 refills | Status: DC

## 2018-01-22 NOTE — Progress Notes (Signed)
Kelsey Church 01/19/1951 [id number]   History:    67 y.o. G2P1A1L1 Widowed  RP:  Established patient presenting for annual gyn exam   HPI: Menopause with persisting hot flashes and night sweats.  Has try to wean the estradiol patch without success.  On estradiol 0.05 weekly and Prometrium 100 mg at bedtime.  No postmenopausal bleeding.  No pelvic pain.  Followed by a chiropractor who noticed a uterine fibroid on x-rays, possibly increased in size.  Urine and bowel movements normal.  Breasts normal.  Weight gain with a body mass index at 33.61.  Walking her dogs.  Health labs with family physician.  Past medical history,surgical history, family history and social history were all reviewed and documented in the EPIC chart.  Gynecologic History No LMP recorded. Patient is postmenopausal. Contraception: post menopausal status Last Pap: 01/2017. Results were: Negative Last mammogram: 08/2017. Results were: Dx Left Probably benign.  02/2017 Rt negative. Bone Density: 2016 Normal.  Will repeat at 5 yrs Colonoscopy: 2016  Obstetric History OB History  Gravida Para Term Preterm AB Living  2 1   1   1   SAB TAB Ectopic Multiple Live Births               # Outcome Date GA Lbr Len/2nd Weight Sex Delivery Anes PTL Lv  2 Gravida           1 Preterm              ROS: A ROS was performed and pertinent positives and negatives are included in the history.  GENERAL: No fevers or chills. HEENT: No change in vision, no earache, sore throat or sinus congestion. NECK: No pain or stiffness. CARDIOVASCULAR: No chest pain or pressure. No palpitations. PULMONARY: No shortness of breath, cough or wheeze. GASTROINTESTINAL: No abdominal pain, nausea, vomiting or diarrhea, melena or bright red blood per rectum. GENITOURINARY: No urinary frequency, urgency, hesitancy or dysuria. MUSCULOSKELETAL: No joint or muscle pain, no back pain, no recent trauma. DERMATOLOGIC: No rash, no itching, no lesions. ENDOCRINE: No  polyuria, polydipsia, no heat or cold intolerance. No recent change in weight. HEMATOLOGICAL: No anemia or easy bruising or bleeding. NEUROLOGIC: No headache, seizures, numbness, tingling or weakness. PSYCHIATRIC: No depression, no loss of interest in normal activity or change in sleep pattern.     Exam:   BP 124/78   Ht 5\' 5"  (1.651 m)   Wt 202 lb (91.6 kg)   BMI 33.61 kg/m   Body mass index is 33.61 kg/m.  General appearance : Well developed well nourished female. No acute distress HEENT: Eyes: no retinal hemorrhage or exudates,  Neck supple, trachea midline, no carotid bruits, no thyroidmegaly Lungs: Clear to auscultation, no rhonchi or wheezes, or rib retractions  Heart: Regular rate and rhythm, no murmurs or gallops Breast:Examined in sitting and supine position were symmetrical in appearance, no palpable masses or tenderness,  no skin retraction, no nipple inversion, no nipple discharge, no skin discoloration, no axillary or supraclavicular lymphadenopathy Abdomen: no palpable masses or tenderness, no rebound or guarding Extremities: no edema or skin discoloration or tenderness  Pelvic: Vulva: Normal             Vagina: No gross lesions or discharge  Cervix: No gross lesions or discharge  Uterus  AV, normal size, shape and consistency, non-tender and mobile  Adnexa  Without masses or tenderness  Anus: Normal   Assessment/Plan:  67 y.o. female for annual exam   1.  Well female exam with routine gynecological exam Normal gynecologic exam.  Pap test negative in December 2018.  No indication to repeat this year.  Breast exam normal.  Screening mammogram due in January 2020.  Health labs with family physician.  2. Menopausal syndrome on hormone replacement therapy Decision to continue on hormone replacement therapy because of persistent vasomotor symptoms.  Patient is aware of the increased risk in breast cancer and strokes.  Estradiol 0.05 patch weekly and Prometrium 100 mg  capsule at bedtime represcribed.  Bone density in 2016 was normal.  Will repeat at 5 years.  Vitamin D supplement, calcium intake of 1.5 g a day and weightbearing physical activity recommended.  3. Fibroid Uterine fibroids seen on x-ray with chiropractor.  Asymptomatic.  Follow-up with a pelvic ultrasound to assess. - Pelvic US  4. Class 1 obesity due to excess calories without serious comorbidity with body mass index (BMI) of 33.0 to 33.9 in adult Recommend a lower calorie/carb diet such as [employer].  Increased physical activity also recommended with aerobic activities 5 times a week and weightlifting every 2 days.  Other orders - estradiol (CLIMARA - DOSED IN MG/24 HR) 0.05 mg/24hr patch; Place 1 patch (0.05 mg total) onto the skin once a week. - progesterone (PROMETRIUM) 100 MG capsule; Take 1 capsule (100 mg total) by mouth at bedtime.  Counseling on above issues and coordination of care more than 50% for 10 minutes.  Princess Bruins MD, 3:53 PM 01/22/2018

## 2018-01-22 NOTE — Patient Instructions (Signed)
1. Well female exam with routine gynecological exam Normal gynecologic exam.  Pap test negative in December 2018.  No indication to repeat this year.  Breast exam normal.  Screening mammogram due in January 2020.  Health labs with family physician.  2. Menopausal syndrome on hormone replacement therapy Decision to continue on hormone replacement therapy because of persistent vasomotor symptoms.  Patient is aware of the increased risk in breast cancer and strokes.  Estradiol 0.05 patch weekly and Prometrium 100 mg capsule at bedtime represcribed.  Bone density in 2016 was normal.  Will repeat at 5 years.  Vitamin D supplement, calcium intake of 1.5 g a day and weightbearing physical activity recommended.  3. Fibroid Uterine fibroids seen on x-ray with chiropractor.  Asymptomatic.  Follow-up with a pelvic ultrasound to assess. - Pelvic US  4. Class 1 obesity due to excess calories without serious comorbidity with body mass index (BMI) of 33.0 to 33.9 in adult Recommend a lower calorie/carb diet such as [place].  Increased physical activity also recommended with aerobic activities 5 times a week and weightlifting every 2 days.  Other orders - estradiol (CLIMARA - DOSED IN MG/24 HR) 0.05 mg/24hr patch; Place 1 patch (0.05 mg total) onto the skin once a week. - progesterone (PROMETRIUM) 100 MG capsule; Take 1 capsule (100 mg total) by mouth at bedtime.  Kelsey Church, it was a pleasure seeing you today!

## 2018-01-23 ENCOUNTER — Other Ambulatory Visit: Payer: Self-pay | Admitting: Internal Medicine

## 2018-01-23 DIAGNOSIS — T783XXA Angioneurotic edema, initial encounter: Secondary | ICD-10-CM

## 2018-01-26 ENCOUNTER — Other Ambulatory Visit: Payer: Self-pay | Admitting: Family Medicine

## 2018-02-20 NOTE — Progress Notes (Signed)
[name] [place], [place] [address] Phone: [phone] Subjective:    I Kelsey Church am serving as a Education administrator for Dr. Hulan Saas.    CC: Right shoulder pain follow-up  LPF:[id number]  Kelsey Church is a 68 y.o. female coming in with complaint of right shoulder pain. States that she is achy. The past week the pain has gradually come back.  Still not as severe as what it was prior to the injection.  States that she is 85% better.  Patient is having very minimal radiation to the tricep but no pain in the elbow or in the hand.  Very minimal discomfort in the neck.  Has been alternating with heat and ice.       Past Medical History:  Diagnosis Date  . Arthritis    PAIN AND OA LEFT KNEE; S/P RIGHT TOTAL KNEE ARTHROPLASTY 06/10/13  . Cancer ([place])    melanoma in 2 sights  . Complication of anesthesia    "chills every evening at sundown x 2 weeks"- no fever  . Depression   . Dysphagia   . GERD (gastroesophageal reflux disease)   . History of shingles    NO RESIDUAL PROBLEMS  . Hypertension   . Melanoma in situ ([place])   . Pain    LOWER BACK PAIN  . Sleep apnea 09/2011   CPAP  . Sleep apnea    Past Surgical History:  Procedure Laterality Date  . APPENDECTOMY  1958  . COLONOSCOPY    . COLONOSCOPY W/ POLYPECTOMY     7 polyps  . FOOT SURGERY Right    removal plantar wart  . GALLBLADDER SURGERY  02/14/1990  . INJECTION KNEE  09/2011   right   . KNEE ARTHROSCOPY  1996  . MELANOMA EXCISION  1992   right chin, back  . POLYPECTOMY    . SALPINGOOPHORECTOMY  2009   Right  . TOTAL KNEE ARTHROPLASTY Right 06/10/2013   Procedure: RIGHT TOTAL KNEE ARTHROPLASTY;  Surgeon: [name], MD;  Location: [facility];  Service: Orthopedics;  Laterality: Right;  . TOTAL KNEE ARTHROPLASTY Left 12/16/2013   Procedure: LEFT TOTAL KNEE ARTHROPLASTY;  Surgeon: [name], MD;  Location: [facility];  Service: Orthopedics;  Laterality: Left;   Social  History   Socioeconomic History  . Marital status: Widowed    Spouse name: Not on file  . Number of children: 1  . Years of education: Not on file  . Highest education level: Not on file  Occupational History  . Occupation: CMA    Employer: [employer]  . Financial resource strain: Not on file  . Food insecurity:    Worry: Not on file    Inability: Not on file  . Transportation needs:    Medical: Not on file    Non-medical: Not on file  Tobacco Use  . Smoking status: Never Smoker  . Smokeless tobacco: Never Used  Substance and Sexual Activity  . Alcohol use: No    Alcohol/week: 0.0 standard drinks  . Drug use: No  . Sexual activity: Never    Comment: 1st intercourse- 18, partners- 1,   Lifestyle  . Physical activity:    Days per week: Not on file    Minutes per session: Not on file  . Stress: Not on file  Relationships  . Social connections:    Talks on phone: Not on file    Gets together: Not on  file    Attends religious service: Not on file    Active member of club or organization: Not on file    Attends meetings of clubs or organizations: Not on file    Relationship status: Not on file  Other Topics Concern  . Not on file  Social History Narrative   Caffienated drinks-no   Seat belt use often-yes   Regular Exercise-yes   Smoke alarm in the home-yes   Firearms/guns in the home-no   History of physical abuse-no               Allergies  Allergen Reactions  . Aleve [Naproxen Sodium] Swelling    angioedema  . Meloxicam Swelling    angioedema   Family History  Problem Relation Age of Onset  . Heart disease Father   . Heart attack Father   . Hypertension Father   . Osteoporosis Mother   . Breast cancer Mother   . Cancer Maternal Grandmother        Breast and Uterine Cancer  . Breast cancer Maternal Grandmother   . Thyroid cancer Maternal Grandmother   . Hypertension Sister   . Diabetes Sister   . Colon polyps Sister   . Colon  cancer Neg Hx   . Rectal cancer Neg Hx   . Stomach cancer Neg Hx   . Esophageal cancer Neg Hx   . Allergic rhinitis Neg Hx   . Asthma Neg Hx   . Eczema Neg Hx   . Urticaria Neg Hx     Current Outpatient Medications (Endocrine & Metabolic):  .  estradiol (CLIMARA - DOSED IN MG/24 HR) 0.05 mg/24hr patch, Place 1 patch (0.05 mg total) onto the skin once a week. .  progesterone (PROMETRIUM) 100 MG capsule, Take 1 capsule (100 mg total) by mouth at bedtime.  Current Outpatient Medications (Cardiovascular):  .  furosemide (LASIX) 20 MG tablet, TAKE 1 TABLET BY MOUTH EVERY DAY .  losartan (COZAAR) 100 MG tablet, Take 100 mg by mouth daily.  Current Outpatient Medications (Respiratory):  .  cetirizine (ZYRTEC) 10 MG tablet, Take 10 mg by mouth daily.  Current Outpatient Medications (Analgesics):  .  acetaminophen (TYLENOL) 650 MG CR tablet, Take 650 mg by mouth every 8 (eight) hours as needed for pain. .  meloxicam (MOBIC) 15 MG tablet, Take 15 mg by mouth daily.   Current Outpatient Medications (Other):  .  citalopram (CELEXA) 20 MG tablet, TAKE 1 TABLET (20 MG TOTAL) BY MOUTH AT BEDTIME.--FILL 07-18-2015- .  fluticasone (CUTIVATE) 0.05 % cream, Apply topically 2 (two) times daily. May use on the neck and face twice a day up to 1 week at a time as needed for flares. .  mometasone (ELOCON) 0.1 % cream, Apply 1 application topically 2 (two) times daily. May use on body (below neck) twice a day up to 2 weeks at a time for flares as needed.    Past medical history, social, surgical and family history all reviewed in electronic medical record.  No pertanent information unless stated regarding to the chief complaint.   Review of Systems:  No headache, visual changes, nausea, vomiting, diarrhea, constipation, dizziness, abdominal pain, skin rash, fevers, chills, night sweats, weight loss, swollen lymph nodes, body aches, joint swelling,  chest pain, shortness of breath, mood changes.  Positive  muscle aches  Objective  Blood pressure 136/74, pulse 68, height 5\' 5"  (1.651 m), weight 202 lb (91.6 kg), SpO2 97 %.    General: No apparent distress  alert and oriented x3 mood and affect normal, dressed appropriately.  HEENT: Pupils equal, extraocular movements intact  Respiratory: Patient's speak in full sentences and does not appear short of breath  Cardiovascular: No lower extremity edema, non tender, no erythema  Skin: Warm dry intact with no signs of infection or rash on extremities or on axial skeleton.  Abdomen: Soft nontender  Neuro: Cranial nerves II through XII are intact, neurovascularly intact in all extremities with 2+ DTRs and 2+ pulses.  Lymph: No lymphadenopathy of posterior or anterior cervical chain or axillae bilaterally.  Gait normal with good balance and coordination.  MSK:  tender with full range of motion and good stability and symmetric strength and tone of  elbows, wrist, hip, knee and ankles bilaterally.  Some of the pain in her body seems to be out of proportion. Right shoulder exam still has some positive impingement noted.  5 out of 5 strength of the rotator cuff.  Mild positive O'Brien's.    Impression and Recommendations:     The above documentation has been reviewed and is accurate and complete Lyndal Pulley, DO       Note: This dictation was prepared with Dragon dictation along with smaller phrase technology. Any transcriptional errors that result from this process are unintentional.

## 2018-02-21 ENCOUNTER — Ambulatory Visit (INDEPENDENT_AMBULATORY_CARE_PROVIDER_SITE_OTHER): Payer: Medicare Other | Admitting: Family Medicine

## 2018-02-21 ENCOUNTER — Encounter: Payer: Self-pay | Admitting: Family Medicine

## 2018-02-21 DIAGNOSIS — M7551 Bursitis of right shoulder: Secondary | ICD-10-CM

## 2018-02-21 NOTE — Assessment & Plan Note (Signed)
Patient has had bursitis multiple times.  Seems to be doing a little bit better.  We discussed the possibility of a labral pathology that could be contributing and we could do an MRI.  Patient wants to continue with conservative therapy.  Follow-up with me as needed

## 2018-02-21 NOTE — Patient Instructions (Signed)
Good to see you  Ice is your friend at night can do heat before activity  Keep hands within peripheral vision  If needed I will call in trazodone to help with sleep  See me when you need me

## 2018-02-26 DIAGNOSIS — D225 Melanocytic nevi of trunk: Secondary | ICD-10-CM | POA: Diagnosis not present

## 2018-02-26 DIAGNOSIS — D2262 Melanocytic nevi of left upper limb, including shoulder: Secondary | ICD-10-CM | POA: Diagnosis not present

## 2018-02-26 DIAGNOSIS — C44619 Basal cell carcinoma of skin of left upper limb, including shoulder: Secondary | ICD-10-CM | POA: Diagnosis not present

## 2018-02-26 DIAGNOSIS — L821 Other seborrheic keratosis: Secondary | ICD-10-CM | POA: Diagnosis not present

## 2018-02-26 DIAGNOSIS — L570 Actinic keratosis: Secondary | ICD-10-CM | POA: Diagnosis not present

## 2018-02-26 DIAGNOSIS — L814 Other melanin hyperpigmentation: Secondary | ICD-10-CM | POA: Diagnosis not present

## 2018-02-26 DIAGNOSIS — Z85820 Personal history of malignant melanoma of skin: Secondary | ICD-10-CM | POA: Diagnosis not present

## 2018-02-26 DIAGNOSIS — D485 Neoplasm of uncertain behavior of skin: Secondary | ICD-10-CM | POA: Diagnosis not present

## 2018-02-26 DIAGNOSIS — C44519 Basal cell carcinoma of skin of other part of trunk: Secondary | ICD-10-CM | POA: Diagnosis not present

## 2018-02-26 DIAGNOSIS — Z85828 Personal history of other malignant neoplasm of skin: Secondary | ICD-10-CM | POA: Diagnosis not present

## 2018-02-26 DIAGNOSIS — D2261 Melanocytic nevi of right upper limb, including shoulder: Secondary | ICD-10-CM | POA: Diagnosis not present

## 2018-02-26 DIAGNOSIS — D1801 Hemangioma of skin and subcutaneous tissue: Secondary | ICD-10-CM | POA: Diagnosis not present

## 2018-03-01 ENCOUNTER — Ambulatory Visit (INDEPENDENT_AMBULATORY_CARE_PROVIDER_SITE_OTHER): Payer: Medicare Other | Admitting: Obstetrics & Gynecology

## 2018-03-01 ENCOUNTER — Ambulatory Visit (INDEPENDENT_AMBULATORY_CARE_PROVIDER_SITE_OTHER): Payer: Medicare Other

## 2018-03-01 ENCOUNTER — Encounter: Payer: Self-pay | Admitting: Obstetrics & Gynecology

## 2018-03-01 DIAGNOSIS — D219 Benign neoplasm of connective and other soft tissue, unspecified: Secondary | ICD-10-CM | POA: Diagnosis not present

## 2018-03-01 NOTE — Progress Notes (Signed)
    Kelsey Church 10-13-1950 [id number]        68 y.o.  G2P0101   RP: Fibroids for Pelvic US  HPI: Menopause, well on no HRT.  No PMB.  No pelvic pain.  Incidental finding of a Uterine Fibroid by X-Ray with Chiropractor.   OB History  Gravida Para Term Preterm AB Living  2 1   1   1   SAB TAB Ectopic Multiple Live Births               # Outcome Date GA Lbr Len/2nd Weight Sex Delivery Anes PTL Lv  2 Gravida           1 Preterm             Past medical history,surgical history, problem list, medications, allergies, family history and social history were all reviewed and documented in the EPIC chart.   Directed ROS with pertinent positives and negatives documented in the history of present illness/assessment and plan.  Exam:  There were no vitals filed for this visit. General appearance:  Normal  Pelvic US today: T/V images.  Retroverted uterus measuring 7.18 x 4.68 x 4.13 cm.  Calcified intramural fibroid at the fundus measuring 3.7 x 3.4 cm.  Several other intramural fibroids less than a centimeter.  Normal thin endometrial lining measuring 3.8 mm.  Right ovary surgically absent.  Left ovary small and atrophic.  No free fluid in the posterior cul-de-sac.   Assessment/Plan:  68 y.o. G2P0101   1. Fibroids A symptomatic uterine fibroids and menopause, incidental findings on x-ray with chiropractor.  Pelvic ultrasound findings thoroughly reviewed with patient.  Overall size of the uterus is normal and patient has a fundal fibroid measuring 3.7 cm and several others that are all below 1 cm.  Normal thin endometrial lining at 3.8 mm.  Left ovary small and atrophic, right ovary surgically absent.  No free fluid in the posterior cutis sac.  Patient reassured about pelvic ultrasound findings especially in the context of asymptomatic fibroids.  Will observe.  Follow-up annual gynecologic exam.  Counseling on above issues and coordination of care more than 50% for 15  minutes.  Princess Bruins MD, 3:14 PM 03/01/2018

## 2018-03-08 ENCOUNTER — Encounter: Payer: Self-pay | Admitting: Obstetrics & Gynecology

## 2018-03-08 NOTE — Patient Instructions (Signed)
1. Fibroids A symptomatic uterine fibroids and menopause, incidental findings on x-ray with chiropractor.  Pelvic ultrasound findings thoroughly reviewed with patient.  Overall size of the uterus is normal and patient has a fundal fibroid measuring 3.7 cm and several others that are all below 1 cm.  Normal thin endometrial lining at 3.8 mm.  Left ovary small and atrophic, right ovary surgically absent.  No free fluid in the posterior cutis sac.  Patient reassured about pelvic ultrasound findings especially in the context of asymptomatic fibroids.  Will observe.  Follow-up annual gynecologic exam.  Kelsey Church, it was a pleasure seeing you today!

## 2018-03-09 ENCOUNTER — Other Ambulatory Visit: Payer: Self-pay | Admitting: Family Medicine

## 2018-03-09 ENCOUNTER — Other Ambulatory Visit: Payer: Self-pay | Admitting: Internal Medicine

## 2018-03-12 DIAGNOSIS — M531 Cervicobrachial syndrome: Secondary | ICD-10-CM | POA: Diagnosis not present

## 2018-03-12 DIAGNOSIS — M9904 Segmental and somatic dysfunction of sacral region: Secondary | ICD-10-CM | POA: Diagnosis not present

## 2018-03-12 DIAGNOSIS — M5136 Other intervertebral disc degeneration, lumbar region: Secondary | ICD-10-CM | POA: Diagnosis not present

## 2018-03-12 DIAGNOSIS — M5137 Other intervertebral disc degeneration, lumbosacral region: Secondary | ICD-10-CM | POA: Diagnosis not present

## 2018-03-12 DIAGNOSIS — M9903 Segmental and somatic dysfunction of lumbar region: Secondary | ICD-10-CM | POA: Diagnosis not present

## 2018-03-12 DIAGNOSIS — M9901 Segmental and somatic dysfunction of cervical region: Secondary | ICD-10-CM | POA: Diagnosis not present

## 2018-03-19 DIAGNOSIS — M5137 Other intervertebral disc degeneration, lumbosacral region: Secondary | ICD-10-CM | POA: Diagnosis not present

## 2018-03-19 DIAGNOSIS — M9904 Segmental and somatic dysfunction of sacral region: Secondary | ICD-10-CM | POA: Diagnosis not present

## 2018-03-19 DIAGNOSIS — M5136 Other intervertebral disc degeneration, lumbar region: Secondary | ICD-10-CM | POA: Diagnosis not present

## 2018-03-19 DIAGNOSIS — M9903 Segmental and somatic dysfunction of lumbar region: Secondary | ICD-10-CM | POA: Diagnosis not present

## 2018-03-19 DIAGNOSIS — M531 Cervicobrachial syndrome: Secondary | ICD-10-CM | POA: Diagnosis not present

## 2018-03-19 DIAGNOSIS — M9901 Segmental and somatic dysfunction of cervical region: Secondary | ICD-10-CM | POA: Diagnosis not present

## 2018-03-25 ENCOUNTER — Other Ambulatory Visit: Payer: Self-pay | Admitting: Internal Medicine

## 2018-03-26 DIAGNOSIS — M9901 Segmental and somatic dysfunction of cervical region: Secondary | ICD-10-CM | POA: Diagnosis not present

## 2018-03-26 DIAGNOSIS — M9903 Segmental and somatic dysfunction of lumbar region: Secondary | ICD-10-CM | POA: Diagnosis not present

## 2018-03-26 DIAGNOSIS — M5136 Other intervertebral disc degeneration, lumbar region: Secondary | ICD-10-CM | POA: Diagnosis not present

## 2018-03-26 DIAGNOSIS — M531 Cervicobrachial syndrome: Secondary | ICD-10-CM | POA: Diagnosis not present

## 2018-03-26 DIAGNOSIS — M9904 Segmental and somatic dysfunction of sacral region: Secondary | ICD-10-CM | POA: Diagnosis not present

## 2018-03-26 DIAGNOSIS — M5137 Other intervertebral disc degeneration, lumbosacral region: Secondary | ICD-10-CM | POA: Diagnosis not present

## 2018-04-02 DIAGNOSIS — M9901 Segmental and somatic dysfunction of cervical region: Secondary | ICD-10-CM | POA: Diagnosis not present

## 2018-04-02 DIAGNOSIS — M5137 Other intervertebral disc degeneration, lumbosacral region: Secondary | ICD-10-CM | POA: Diagnosis not present

## 2018-04-02 DIAGNOSIS — M531 Cervicobrachial syndrome: Secondary | ICD-10-CM | POA: Diagnosis not present

## 2018-04-02 DIAGNOSIS — M9904 Segmental and somatic dysfunction of sacral region: Secondary | ICD-10-CM | POA: Diagnosis not present

## 2018-04-02 DIAGNOSIS — M5136 Other intervertebral disc degeneration, lumbar region: Secondary | ICD-10-CM | POA: Diagnosis not present

## 2018-04-02 DIAGNOSIS — M9903 Segmental and somatic dysfunction of lumbar region: Secondary | ICD-10-CM | POA: Diagnosis not present

## 2018-04-03 ENCOUNTER — Ambulatory Visit: Payer: Medicare Other | Admitting: Internal Medicine

## 2018-04-05 ENCOUNTER — Ambulatory Visit: Payer: Medicare Other | Admitting: Family Medicine

## 2018-04-09 DIAGNOSIS — M5137 Other intervertebral disc degeneration, lumbosacral region: Secondary | ICD-10-CM | POA: Diagnosis not present

## 2018-04-09 DIAGNOSIS — M9904 Segmental and somatic dysfunction of sacral region: Secondary | ICD-10-CM | POA: Diagnosis not present

## 2018-04-09 DIAGNOSIS — M9901 Segmental and somatic dysfunction of cervical region: Secondary | ICD-10-CM | POA: Diagnosis not present

## 2018-04-09 DIAGNOSIS — M531 Cervicobrachial syndrome: Secondary | ICD-10-CM | POA: Diagnosis not present

## 2018-04-09 DIAGNOSIS — M5136 Other intervertebral disc degeneration, lumbar region: Secondary | ICD-10-CM | POA: Diagnosis not present

## 2018-04-09 DIAGNOSIS — M9903 Segmental and somatic dysfunction of lumbar region: Secondary | ICD-10-CM | POA: Diagnosis not present

## 2018-04-23 DIAGNOSIS — M9903 Segmental and somatic dysfunction of lumbar region: Secondary | ICD-10-CM | POA: Diagnosis not present

## 2018-04-23 DIAGNOSIS — M531 Cervicobrachial syndrome: Secondary | ICD-10-CM | POA: Diagnosis not present

## 2018-04-23 DIAGNOSIS — M5136 Other intervertebral disc degeneration, lumbar region: Secondary | ICD-10-CM | POA: Diagnosis not present

## 2018-04-23 DIAGNOSIS — M9904 Segmental and somatic dysfunction of sacral region: Secondary | ICD-10-CM | POA: Diagnosis not present

## 2018-04-23 DIAGNOSIS — M9901 Segmental and somatic dysfunction of cervical region: Secondary | ICD-10-CM | POA: Diagnosis not present

## 2018-04-23 DIAGNOSIS — M5137 Other intervertebral disc degeneration, lumbosacral region: Secondary | ICD-10-CM | POA: Diagnosis not present

## 2018-04-30 DIAGNOSIS — M9903 Segmental and somatic dysfunction of lumbar region: Secondary | ICD-10-CM | POA: Diagnosis not present

## 2018-04-30 DIAGNOSIS — M5136 Other intervertebral disc degeneration, lumbar region: Secondary | ICD-10-CM | POA: Diagnosis not present

## 2018-04-30 DIAGNOSIS — M9901 Segmental and somatic dysfunction of cervical region: Secondary | ICD-10-CM | POA: Diagnosis not present

## 2018-04-30 DIAGNOSIS — M5137 Other intervertebral disc degeneration, lumbosacral region: Secondary | ICD-10-CM | POA: Diagnosis not present

## 2018-04-30 DIAGNOSIS — M531 Cervicobrachial syndrome: Secondary | ICD-10-CM | POA: Diagnosis not present

## 2018-04-30 DIAGNOSIS — M9904 Segmental and somatic dysfunction of sacral region: Secondary | ICD-10-CM | POA: Diagnosis not present

## 2018-05-14 ENCOUNTER — Other Ambulatory Visit: Payer: Self-pay | Admitting: Internal Medicine

## 2018-05-14 DIAGNOSIS — M545 Low back pain: Secondary | ICD-10-CM

## 2018-05-14 DIAGNOSIS — M5416 Radiculopathy, lumbar region: Secondary | ICD-10-CM

## 2018-05-14 DIAGNOSIS — M2550 Pain in unspecified joint: Secondary | ICD-10-CM

## 2018-05-14 DIAGNOSIS — M7551 Bursitis of right shoulder: Secondary | ICD-10-CM

## 2018-05-14 DIAGNOSIS — M25511 Pain in right shoulder: Secondary | ICD-10-CM

## 2018-05-14 DIAGNOSIS — G8929 Other chronic pain: Secondary | ICD-10-CM

## 2018-05-14 DIAGNOSIS — M7062 Trochanteric bursitis, left hip: Secondary | ICD-10-CM

## 2018-05-14 MED ORDER — MELOXICAM 15 MG PO TABS: ORAL_TABLET | ORAL | 1 refills | Status: DC

## 2018-06-05 DIAGNOSIS — M9904 Segmental and somatic dysfunction of sacral region: Secondary | ICD-10-CM | POA: Diagnosis not present

## 2018-06-05 DIAGNOSIS — M5137 Other intervertebral disc degeneration, lumbosacral region: Secondary | ICD-10-CM | POA: Diagnosis not present

## 2018-06-05 DIAGNOSIS — M5136 Other intervertebral disc degeneration, lumbar region: Secondary | ICD-10-CM | POA: Diagnosis not present

## 2018-06-05 DIAGNOSIS — M9903 Segmental and somatic dysfunction of lumbar region: Secondary | ICD-10-CM | POA: Diagnosis not present

## 2018-06-05 DIAGNOSIS — M9901 Segmental and somatic dysfunction of cervical region: Secondary | ICD-10-CM | POA: Diagnosis not present

## 2018-06-05 DIAGNOSIS — M531 Cervicobrachial syndrome: Secondary | ICD-10-CM | POA: Diagnosis not present

## 2018-06-06 ENCOUNTER — Telehealth: Payer: Self-pay

## 2018-06-06 NOTE — Telephone Encounter (Signed)
Copied from [place] [id number]. Topic: Appointment Scheduling - Scheduling Inquiry for Clinic >> Jun 06, 2018 11:36 AM Yvette Rack wrote: Reason for CRM: Pt requests an appt with Dr. Tamala Julian for pain in right lower groin/hip area. Attempted to transfer pt but first the line was busy then there was no answer. Pt requests call back to schedule appt.

## 2018-06-06 NOTE — Telephone Encounter (Signed)
Patient was moving flower pots and has since been having pain in the left hip/groin. Unable to cross her leg over the other. Has been using Duexis to alleviate her pain but did have to take a Mobic this morning as well. Is on schedule tomorrow for in-person visit.

## 2018-06-06 NOTE — Progress Notes (Signed)
[facility] [place], [place] [address] Phone: [phone] Subjective:   I Kelsey Church am serving as a Education administrator for Dr. Hulan Saas.  I'm seeing this patient by the request  of:    CC: Sudden groin pain and heel pain  EQA:[id number]   02/21/2018 Patient has had bursitis multiple times.  Seems to be doing a little bit better.  We discussed the possibility of a labral pathology that could be contributing and we could do an MRI.  Patient wants to continue with conservative therapy.  Follow-up with me as needed  06/07/2018 Kelsey Church is a 68 y.o. female coming in with complaint of groin pain. States that she has been lifting a pot. Believes she lifted incorrectly. States it feels the same as when she had and ovarian cyst. Seen by a chiropractor Tuesday. Hip has limited ROM. Having trouble walking long distances. Left foot pain. States she believes something is in it. Believes it may have something to do with dry skin. Remembers stepping on her foot awkwardly. TTP.   Onset- a week and a half Location- right groin  Duration-  Character-dull, throbbing aching pain Aggravating factors- walking  Reliving factors-rest Therapies tried- ice, pennsaid, topical  Severity- 10     Past Medical History:  Diagnosis Date  . Arthritis    PAIN AND OA LEFT KNEE; S/P RIGHT TOTAL KNEE ARTHROPLASTY 06/10/13  . Cancer ([place])    melanoma in 2 sights  . Complication of anesthesia    "chills every evening at sundown x 2 weeks"- no fever  . Depression   . Dysphagia   . GERD (gastroesophageal reflux disease)   . History of shingles    NO RESIDUAL PROBLEMS  . Hypertension   . Melanoma in situ ([place])   . Pain    LOWER BACK PAIN  . Sleep apnea 09/2011   CPAP  . Sleep apnea    Past Surgical History:  Procedure Laterality Date  . APPENDECTOMY  1958  . COLONOSCOPY    . COLONOSCOPY W/ POLYPECTOMY     7 polyps  . FOOT SURGERY Right    removal plantar wart   . GALLBLADDER SURGERY  02/14/1990  . INJECTION KNEE  09/2011   right   . KNEE ARTHROSCOPY  1996  . MELANOMA EXCISION  1992   right chin, back  . POLYPECTOMY    . SALPINGOOPHORECTOMY  2009   Right  . TOTAL KNEE ARTHROPLASTY Right 06/10/2013   Procedure: RIGHT TOTAL KNEE ARTHROPLASTY;  Surgeon: [name], MD;  Location: [facility];  Service: Orthopedics;  Laterality: Right;  . TOTAL KNEE ARTHROPLASTY Left 12/16/2013   Procedure: LEFT TOTAL KNEE ARTHROPLASTY;  Surgeon: [name], MD;  Location: [facility];  Service: Orthopedics;  Laterality: Left;   Social History   Socioeconomic History  . Marital status: Widowed    Spouse name: Not on file  . Number of children: 1  . Years of education: Not on file  . Highest education level: Not on file  Occupational History  . Occupation: CMA    Employer: [employer]  . Financial resource strain: Not on file  . Food insecurity:    Worry: Not on file    Inability: Not on file  . Transportation needs:    Medical: Not on file    Non-medical: Not on file  Tobacco Use  . Smoking status: Never Smoker  . Smokeless tobacco: Never Used  Substance and Sexual Activity  . Alcohol use: No    Alcohol/week: 0.0 standard drinks  . Drug use: No  . Sexual activity: Never    Comment: 1st intercourse- 18, partners- 1,   Lifestyle  . Physical activity:    Days per week: Not on file    Minutes per session: Not on file  . Stress: Not on file  Relationships  . Social connections:    Talks on phone: Not on file    Gets together: Not on file    Attends religious service: Not on file    Active member of club or organization: Not on file    Attends meetings of clubs or organizations: Not on file    Relationship status: Not on file  Other Topics Concern  . Not on file  Social History Narrative   Caffienated drinks-no   Seat belt use often-yes   Regular Exercise-yes   Smoke alarm in the home-yes   Firearms/guns in the home-no    History of physical abuse-no               No Active Allergies Family History  Problem Relation Age of Onset  . Heart disease Father   . Heart attack Father   . Hypertension Father   . Osteoporosis Mother   . Breast cancer Mother   . Cancer Maternal Grandmother        Breast and Uterine Cancer  . Breast cancer Maternal Grandmother   . Thyroid cancer Maternal Grandmother   . Hypertension Sister   . Diabetes Sister   . Colon polyps Sister   . Colon cancer Neg Hx   . Rectal cancer Neg Hx   . Stomach cancer Neg Hx   . Esophageal cancer Neg Hx   . Allergic rhinitis Neg Hx   . Asthma Neg Hx   . Eczema Neg Hx   . Urticaria Neg Hx     Current Outpatient Medications (Endocrine & Metabolic):  .  estradiol (CLIMARA - DOSED IN MG/24 HR) 0.05 mg/24hr patch, Place 1 patch (0.05 mg total) onto the skin once a week. .  progesterone (PROMETRIUM) 100 MG capsule, Take 1 capsule (100 mg total) by mouth at bedtime.  Current Outpatient Medications (Cardiovascular):  .  furosemide (LASIX) 20 MG tablet, TAKE 1 TABLET BY MOUTH EVERY DAY .  losartan (COZAAR) 100 MG tablet, TAKE 1 TABLET BY MOUTH EVERY DAY  Current Outpatient Medications (Respiratory):  .  cetirizine (ZYRTEC) 10 MG tablet, Take 10 mg by mouth daily.  Current Outpatient Medications (Analgesics):  .  acetaminophen (TYLENOL) 650 MG CR tablet, Take 650 mg by mouth every 8 (eight) hours as needed for pain. .  meloxicam (MOBIC) 15 MG tablet, TAKE 1 TABLET BY MOUTH ONCE DAILY--FILL 08/09/15--   Current Outpatient Medications (Other):  .  citalopram (CELEXA) 20 MG tablet, TAKE 1 TABLET (20 MG TOTAL) BY MOUTH AT BEDTIME.--FILL 07-18-2015-    Past medical history, social, surgical and family history all reviewed in electronic medical record.  No pertanent information unless stated regarding to the chief complaint.   Review of Systems:  No headache, visual changes, nausea, vomiting, diarrhea, constipation, dizziness, abdominal pain,  skin rash, fevers, chills, night sweats, weight loss, swollen lymph nodes, body aches, joint swelling, , chest pain, shortness of breath, mood changes.  Positive muscle aches  Objective  Blood pressure 118/62, pulse 64, height 5\' 5"  (1.651 m), weight 209 lb (94.8 kg), SpO2 95 %.    General: No apparent  distress alert and oriented x3 mood and affect normal, dressed appropriately.  HEENT: Pupils equal, extraocular movements intact  Respiratory: Patient's speak in full sentences and does not appear short of breath  Cardiovascular: No lower extremity edema, non tender, no erythema  Skin: Warm dry intact with no signs of infection or rash on extremities or on axial skeleton.  Abdomen: Soft nontender  Neuro: Cranial nerves II through XII are intact, neurovascularly intact in all extremities with 2+ DTRs and 2+ pulses.  Lymph: No lymphadenopathy of posterior or anterior cervical chain or axillae bilaterally.  Gait antalgic MSK:  tender with full range of motion and good stability and symmetric strength and tone of shoulders, elbows, wrist, knee and ankles bilaterally.  No moderate arthritic changes in multiple joints  Right hip exam shows some mild decreased range of motion in all planes.  Patient does have some guarding noted.  Increasing groin tenderness to palpation as well as with resisted adduction. No masses are palpable treated into the inguinal area.  Left heel shows the patient does have a fissure from dry skin but otherwise unremarkable.  Patient does have some pes planus with overpronation.  [id number]; 15 additional minutes spent for Therapeutic exercises as stated in above notes.  This included exercises focusing on stretching, strengthening, with significant focus on eccentric aspects.   Long term goals include an improvement in range of motion, strength, endurance as well as avoiding reinjury. Patient's frequency would include in 1-2 times a day, 3-5 times a week for a duration of 6-12  weeks. Hip strengthening exercises which included:  Pelvic tilt/bracing to help with proper recruitment of the lower abs and pelvic floor muscles  Glute strengthening to properly contract glutes without over-engaging low back and hamstrings - prone hip extension and glute bridge exercises Proper stretching techniques to increase effectiveness for the hip flexors, groin, quads, piriformic and low back when appropriate    Proper technique shown and discussed handout in great detail with ATC.  All questions were discussed and answered.     Impression and Recommendations:     This case required medical decision making of moderate complexity. The above documentation has been reviewed and is accurate and complete Lyndal Pulley, DO       Note: This dictation was prepared with Dragon dictation along with smaller phrase technology. Any transcriptional errors that result from this process are unintentional.

## 2018-06-07 ENCOUNTER — Ambulatory Visit: Payer: Self-pay

## 2018-06-07 ENCOUNTER — Other Ambulatory Visit: Payer: Self-pay

## 2018-06-07 ENCOUNTER — Ambulatory Visit (INDEPENDENT_AMBULATORY_CARE_PROVIDER_SITE_OTHER): Payer: Medicare Other | Admitting: Family Medicine

## 2018-06-07 ENCOUNTER — Encounter: Payer: Self-pay | Admitting: Family Medicine

## 2018-06-07 VITALS — BP 118/62 | HR 64 | Ht 65.0 in | Wt 209.0 lb

## 2018-06-07 DIAGNOSIS — M79672 Pain in left foot: Secondary | ICD-10-CM | POA: Diagnosis not present

## 2018-06-07 DIAGNOSIS — R1031 Right lower quadrant pain: Secondary | ICD-10-CM

## 2018-06-07 NOTE — Assessment & Plan Note (Signed)
I believe there is an injury to the muscle.  Discussed compression, home exercises, icing regimen and topical anti-inflammatories.  Discussed proper shoes.  Patient will increase activity slowly.  Work with Product/process development scientist.  Follow-up again 4 weeks

## 2018-06-07 NOTE — Patient Instructions (Addendum)
Good to see you  Groin pull  Thigh compression sleeve would be great daily for likely 2 weeks.  Ice 20 minutes 2 times daily. Usually after activity and before bed. Duexis 3 times a day for 3 days to decrease inflammation  For the heel eucerin or aveeno 2 times a day would be great  Avoid being barefoot  See me again in weeks

## 2018-06-12 DIAGNOSIS — M9903 Segmental and somatic dysfunction of lumbar region: Secondary | ICD-10-CM | POA: Diagnosis not present

## 2018-06-12 DIAGNOSIS — M9901 Segmental and somatic dysfunction of cervical region: Secondary | ICD-10-CM | POA: Diagnosis not present

## 2018-06-12 DIAGNOSIS — M5136 Other intervertebral disc degeneration, lumbar region: Secondary | ICD-10-CM | POA: Diagnosis not present

## 2018-06-12 DIAGNOSIS — M531 Cervicobrachial syndrome: Secondary | ICD-10-CM | POA: Diagnosis not present

## 2018-06-12 DIAGNOSIS — M5137 Other intervertebral disc degeneration, lumbosacral region: Secondary | ICD-10-CM | POA: Diagnosis not present

## 2018-06-12 DIAGNOSIS — M9904 Segmental and somatic dysfunction of sacral region: Secondary | ICD-10-CM | POA: Diagnosis not present

## 2018-06-25 ENCOUNTER — Telehealth: Payer: Self-pay | Admitting: *Deleted

## 2018-06-25 NOTE — Telephone Encounter (Signed)
Medication approved via [facility] for estradiol 0.05 mg patch and progesterone 100 mg cap.

## 2018-06-26 ENCOUNTER — Other Ambulatory Visit: Payer: Self-pay | Admitting: Internal Medicine

## 2018-06-26 DIAGNOSIS — M9903 Segmental and somatic dysfunction of lumbar region: Secondary | ICD-10-CM | POA: Diagnosis not present

## 2018-06-26 DIAGNOSIS — M531 Cervicobrachial syndrome: Secondary | ICD-10-CM | POA: Diagnosis not present

## 2018-06-26 DIAGNOSIS — M5136 Other intervertebral disc degeneration, lumbar region: Secondary | ICD-10-CM | POA: Diagnosis not present

## 2018-06-26 DIAGNOSIS — M9901 Segmental and somatic dysfunction of cervical region: Secondary | ICD-10-CM | POA: Diagnosis not present

## 2018-06-26 DIAGNOSIS — F3342 Major depressive disorder, recurrent, in full remission: Secondary | ICD-10-CM

## 2018-06-26 DIAGNOSIS — M9904 Segmental and somatic dysfunction of sacral region: Secondary | ICD-10-CM | POA: Diagnosis not present

## 2018-06-26 DIAGNOSIS — M5137 Other intervertebral disc degeneration, lumbosacral region: Secondary | ICD-10-CM | POA: Diagnosis not present

## 2018-06-26 MED ORDER — CITALOPRAM HYDROBROMIDE 20 MG PO TABS: ORAL_TABLET | ORAL | 1 refills | Status: DC

## 2018-07-04 ENCOUNTER — Encounter: Payer: Self-pay | Admitting: Family Medicine

## 2018-07-06 ENCOUNTER — Telehealth: Payer: Self-pay | Admitting: Internal Medicine

## 2018-07-06 ENCOUNTER — Other Ambulatory Visit: Payer: Self-pay | Admitting: Internal Medicine

## 2018-07-06 DIAGNOSIS — I10 Essential (primary) hypertension: Secondary | ICD-10-CM

## 2018-07-06 MED ORDER — LOSARTAN POTASSIUM 100 MG PO TABS: 100.0000 mg | ORAL_TABLET | Freq: Every day | ORAL | 1 refills | Status: DC

## 2018-07-06 MED ORDER — FUROSEMIDE 20 MG PO TABS: 20.0000 mg | ORAL_TABLET | Freq: Every day | ORAL | 1 refills | Status: DC

## 2018-07-06 NOTE — Telephone Encounter (Signed)
Copied from [place] [id number]. Topic: Quick Communication - Rx Refill/Question >> Jul 06, 2018 10:17 AM Rainey Pines A wrote: Medication: furosemide (LASIX) 20 MG tablet,losartan (COZAAR) 100 MG tablet ([facility] from [facility] is needing prior authorization for 90 day supply of medications.)  Has the patient contacted their pharmacy? Yes (Agent: If no, request that the patient contact the pharmacy for the refill.) (Agent: If yes, when and what did the pharmacy advise?Contact PCP  Preferred Pharmacy (with phone number or street name): [place], [place] [phone] (Phone) [phone] (Fax)    Agent: Please be advised that RX refills may take up to 3 business days. We ask that you follow-up with your pharmacy.

## 2018-08-29 DIAGNOSIS — M7672 Peroneal tendinitis, left leg: Secondary | ICD-10-CM | POA: Diagnosis not present

## 2018-08-29 DIAGNOSIS — M7662 Achilles tendinitis, left leg: Secondary | ICD-10-CM | POA: Diagnosis not present

## 2018-09-10 ENCOUNTER — Emergency Department (HOSPITAL_BASED_OUTPATIENT_CLINIC_OR_DEPARTMENT_OTHER)
Admission: EM | Admit: 2018-09-10 | Discharge: 2018-09-10 | Disposition: A | Discharge status: Home / Self Care | Payer: Medicare Other | Attending: Emergency Medicine | Admitting: Emergency Medicine

## 2018-09-10 ENCOUNTER — Other Ambulatory Visit: Payer: Self-pay

## 2018-09-10 ENCOUNTER — Encounter (HOSPITAL_BASED_OUTPATIENT_CLINIC_OR_DEPARTMENT_OTHER): Payer: Self-pay

## 2018-09-10 ENCOUNTER — Emergency Department (HOSPITAL_BASED_OUTPATIENT_CLINIC_OR_DEPARTMENT_OTHER): Payer: Medicare Other

## 2018-09-10 DIAGNOSIS — Z79899 Other long term (current) drug therapy: Secondary | ICD-10-CM | POA: Insufficient documentation

## 2018-09-10 DIAGNOSIS — I10 Essential (primary) hypertension: Secondary | ICD-10-CM | POA: Insufficient documentation

## 2018-09-10 DIAGNOSIS — U071 COVID-19: Secondary | ICD-10-CM | POA: Diagnosis not present

## 2018-09-10 DIAGNOSIS — Z20822 Contact with and (suspected) exposure to covid-19: Secondary | ICD-10-CM

## 2018-09-10 DIAGNOSIS — R05 Cough: Secondary | ICD-10-CM | POA: Diagnosis not present

## 2018-09-10 IMAGING — DX PORTABLE CHEST - 1 VIEW
1 series · 1 of 1 positions shown · non-contrast
Comparison: Radiographs [DATE].

CLINICAL DATA: Cough with fatigue and decreased taste sensation.
Recent travel.

EXAM:
PORTABLE CHEST 1 VIEW

[chest ap]
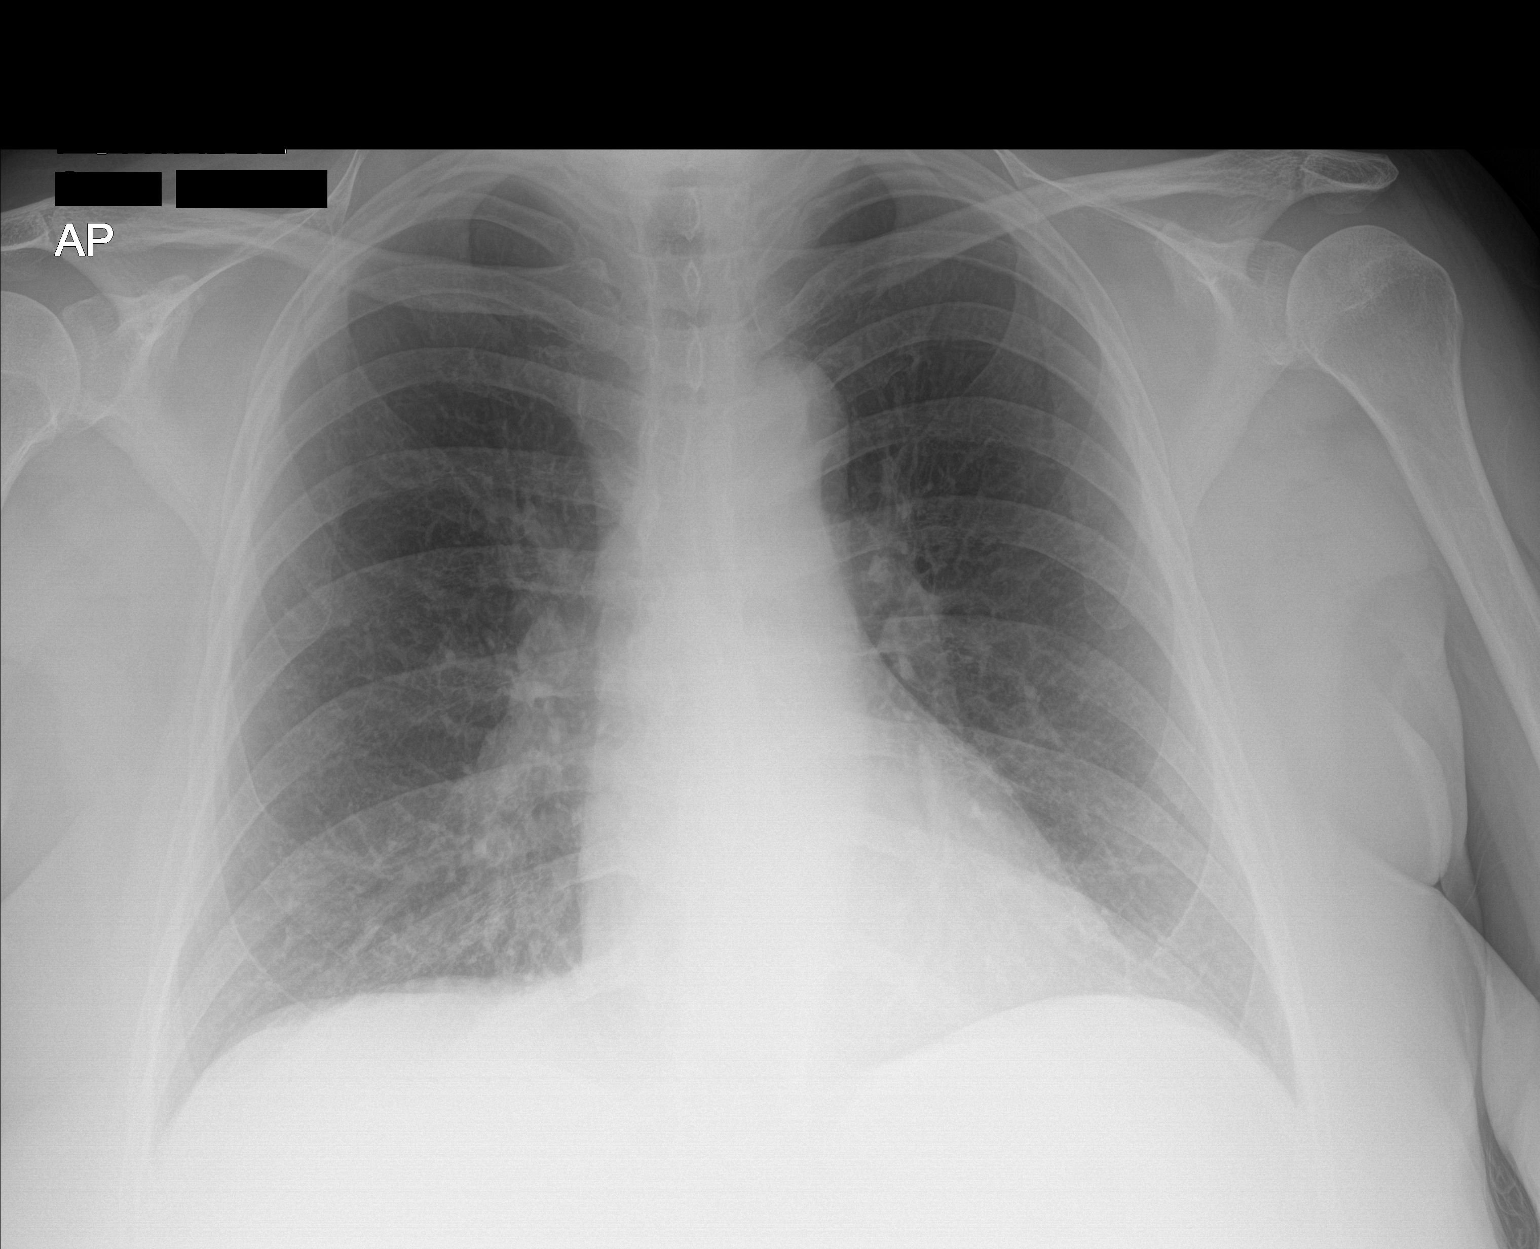

[1 of 1 positions shown; findings below may reference images not displayed]

FINDINGS: [K6] hours. The heart size and mediastinal contours are normal. The
lungs are clear. There is no pleural effusion or pneumothorax. No
acute osseous findings are identified.
IMPRESSION: Stable chest.  No active cardiopulmonary process.

## 2018-09-10 NOTE — Discharge Instructions (Addendum)
Self quarantine until test results are back.  Your outpatient COVID test will be back in probably 2 to 4 days.  Oxygen levels are good today.  Chest x-ray negative.  Return for any new or worse symptoms.  In particular return for any worsening shortness of breath.  Recommend Tylenol and can continue the Mucinex DM.

## 2018-09-10 NOTE — ED Provider Notes (Signed)
[facility] EMERGENCY DEPARTMENT Provider Note   CSN: [id number] Arrival date & time: 09/10/18  1647     History   Chief Complaint Chief Complaint  Patient presents with  . Cough    HPI Kelsey Church is a 68 y.o. female.     Patient with symptoms suggestive of COVID-19 infection.  Symptoms started on Wednesday.  Patient had been traveling to [place].  Was in multiple homes due to volunteer work.  Patient did travel by air.  Returned on Thursday but symptoms started the day before.  Cough fatigue decreased taste body aches chills.  Patient is on CPAP.  Does not have diabetes.  But does have a history of hypertension and sleep apnea.  Patient's son who picked her up at the airport developed symptoms similar within 24 hours.  His COVID testing was negative.  Patient denies any nausea or vomiting.  Patient's had some fever feeling and chills.  But no documented fever.  Temp here was 99.6.  Patient states she does feel short of breath.     Past Medical History:  Diagnosis Date  . Arthritis    PAIN AND OA LEFT KNEE; S/P RIGHT TOTAL KNEE ARTHROPLASTY 06/10/13  . Cancer ([place])    melanoma in 2 sights  . Complication of anesthesia    "chills every evening at sundown x 2 weeks"- no fever  . Depression   . Dysphagia   . GERD (gastroesophageal reflux disease)   . History of shingles    NO RESIDUAL PROBLEMS  . Hypertension   . Melanoma in situ ([place])   . Pain    LOWER BACK PAIN  . Sleep apnea 09/2011   CPAP  . Sleep apnea     Patient Active Problem List   Diagnosis Date Noted  . Severe right groin pain 06/07/2018  . Contact dermatitis due to chemicals 12/29/2017  . Polyarthralgia 11/02/2017  . 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 Greater trochanteric bursitis of left hip 08/28/2017  . Degenerative lumbar disc 11/01/2016  . 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 Nonspecific abnormal electrocardiogram (ECG) (EKG) 08/27/2015  . Gastroesophageal reflux disease with esophagitis  12/10/2014  . Moderate osteopenia 12/10/2014  . Vitamin B12 deficiency 12/10/2014  . OA (osteoarthritis) of knee 12/16/2013  . Prediabetes 12/03/2013  . Bursitis of right shoulder 10/04/2013  . Other screening mammogram 09/25/2012  . DJD (degenerative joint disease) of knee 09/09/2011  . OSA (obstructive sleep apnea) 07/29/2011  . Essential hypertension, benign 07/29/2011  . Pure hypercholesterolemia 07/29/2011  . Routine general medical examination at a health care facility 07/29/2011    Past Surgical History:  Procedure Laterality Date  . APPENDECTOMY  1958  . COLONOSCOPY    . COLONOSCOPY W/ POLYPECTOMY     7 polyps  . FOOT SURGERY Right    removal plantar wart  . GALLBLADDER SURGERY  02/14/1990  . INJECTION KNEE  09/2011   right   . KNEE ARTHROSCOPY  1996  . MELANOMA EXCISION  1992   right chin, back  . POLYPECTOMY    . SALPINGOOPHORECTOMY  2009   Right  . TOTAL KNEE ARTHROPLASTY Right 06/10/2013   Procedure: RIGHT TOTAL KNEE ARTHROPLASTY;  Surgeon: [name], MD;  Location: [facility];  Service: Orthopedics;  Laterality: Right;  . TOTAL KNEE ARTHROPLASTY Left 12/16/2013   Procedure: LEFT TOTAL KNEE ARTHROPLASTY;  Surgeon: [name], MD;  Location: [facility];  Service: Orthopedics;  Laterality: Left;     OB History  Gravida  2   Para  1   Term      Preterm  1   AB      Living  1     SAB      TAB      Ectopic      Multiple      Live Births               Home Medications    Prior to Admission medications   Medication Sig Start Date End Date Taking? Authorizing Provider  acetaminophen (TYLENOL) 650 MG CR tablet Take 650 mg by mouth every 8 (eight) hours as needed for pain.    [provider]  cetirizine (ZYRTEC) 10 MG tablet Take 10 mg by mouth daily.    [provider]  citalopram (CELEXA) 20 MG tablet TAKE 1 TABLET (20 MG TOTAL) BY MOUTH AT BEDTIME. 06/26/18   Janith Lima, MD  estradiol (CLIMARA - DOSED IN MG/24 HR)  0.05 mg/24hr patch Place 1 patch (0.05 mg total) onto the skin once a week. 01/22/18   Princess Bruins, MD  furosemide (LASIX) 20 MG tablet Take 1 tablet (20 mg total) by mouth daily. 07/06/18   Janith Lima, MD  losartan (COZAAR) 100 MG tablet Take 1 tablet (100 mg total) by mouth daily. 07/06/18   Janith Lima, MD  meloxicam (MOBIC) 15 MG tablet TAKE 1 TABLET BY MOUTH ONCE DAILY--FILL 08/09/15-- 05/14/18   Janith Lima, MD  progesterone (PROMETRIUM) 100 MG capsule Take 1 capsule (100 mg total) by mouth at bedtime. 01/22/18   Princess Bruins, MD    Family History Family History  Problem Relation Age of Onset  . Heart disease Father   . Heart attack Father   . Hypertension Father   . Osteoporosis Mother   . Breast cancer Mother   . Cancer Maternal Grandmother        Breast and Uterine Cancer  . Breast cancer Maternal Grandmother   . Thyroid cancer Maternal Grandmother   . Hypertension Sister   . Diabetes Sister   . Colon polyps Sister   . Colon cancer Neg Hx   . Rectal cancer Neg Hx   . Stomach cancer Neg Hx   . Esophageal cancer Neg Hx   . Allergic rhinitis Neg Hx   . Asthma Neg Hx   . Eczema Neg Hx   . Urticaria Neg Hx     Social History Social History   Tobacco Use  . Smoking status: Never Smoker  . Smokeless tobacco: Never Used  Substance Use Topics  . Alcohol use: No    Alcohol/week: 0.0 standard drinks  . Drug use: No     Allergies   Patient has no known allergies.   Review of Systems Review of Systems  Constitutional: Positive for appetite change, chills, fatigue and fever.  HENT: Positive for congestion. Negative for rhinorrhea and sore throat.   Eyes: Negative for visual disturbance.  Respiratory: Positive for cough. Negative for shortness of breath.   Cardiovascular: Negative for chest pain and leg swelling.  Gastrointestinal: Negative for abdominal pain, diarrhea, nausea and vomiting.  Genitourinary: Negative for dysuria.   Musculoskeletal: Positive for myalgias. Negative for back pain and neck pain.  Skin: Negative for rash.  Neurological: Negative for dizziness, light-headedness and headaches.  Hematological: Does not bruise/bleed easily.  Psychiatric/Behavioral: Negative for confusion.     Physical Exam Updated Vital Signs BP 130/64 (BP Location: Right Arm)   Pulse  96   Temp 99.6 F (37.6 C) (Oral)   Ht 1.676 m (5\' 6" )   Wt 90.7 kg   SpO2 96%   BMI 32.28 kg/m   Physical Exam Vitals signs and nursing note reviewed.  Constitutional:      General: She is not in acute distress.    Appearance: Normal appearance. She is well-developed.  HENT:     Head: Normocephalic and atraumatic.     Nose: Congestion present.  Eyes:     Extraocular Movements: Extraocular movements intact.     Conjunctiva/sclera: Conjunctivae normal.     Pupils: Pupils are equal, round, and reactive to light.  Neck:     Musculoskeletal: Normal range of motion and neck supple.  Cardiovascular:     Rate and Rhythm: Normal rate and regular rhythm.     Heart sounds: No murmur.  Pulmonary:     Effort: Pulmonary effort is normal. No respiratory distress.     Breath sounds: Normal breath sounds.  Abdominal:     Palpations: Abdomen is soft.     Tenderness: There is no abdominal tenderness.  Musculoskeletal: Normal range of motion.  Skin:    General: Skin is warm and dry.     Capillary Refill: Capillary refill takes less than 2 seconds.  Neurological:     General: No focal deficit present.     Mental Status: She is alert and oriented to person, place, and time.      ED Treatments / Results  Labs (all labs ordered are listed, but only abnormal results are displayed) Labs Reviewed  NOVEL CORONAVIRUS, NAA (HOSPITAL ORDER, SEND-OUT TO REF LAB)    EKG None  Radiology Dg Chest Port 1 View  Result Date: 09/10/2018 CLINICAL DATA:  Cough with fatigue and decreased taste sensation. Recent travel. EXAM: PORTABLE CHEST 1  VIEW COMPARISON:  Radiographs 06/06/2013. FINDINGS: 1739 hours. The heart size and mediastinal contours are normal. The lungs are clear. There is no pleural effusion or pneumothorax. No acute osseous findings are identified. IMPRESSION: Stable chest.  No active cardiopulmonary process. Electronically Signed   By: Richardean Sale M.D.   On: 09/10/2018 18:17    Procedures Procedures (including critical care time)  Medications Ordered in ED Medications - No data to display   Initial Impression / Assessment and Plan / ED Course  I have reviewed the triage vital signs and the nursing notes.  Pertinent labs & imaging results that were available during my care of the patient were reviewed by me and considered in my medical decision making (see chart for details).        Patient symptoms symptoms consistent with COVID-19 infection.  Outpatient test has been performed.  Patient will self quarantine until test results are back.  In the meantime she will treat symptomatically with Tylenol and Mucinex DM.  Patient's oxygen saturations here 96%.  And not tachypneic.  No respiratory distress.  Chest x-ray negative for pneumonia.  Final Clinical Impressions(s) / ED Diagnoses   Final diagnoses:  Suspected Covid-19 Virus Infection    ED Discharge Orders    None       Fredia Sorrow, MD 09/10/18 1945

## 2018-09-10 NOTE — ED Triage Notes (Addendum)
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 Cough, fatigue, decreased taste, body aches.  Chills.  Pt taking course of steroids for a separate issue.  Took tylenol at 2pm today.

## 2018-09-13 ENCOUNTER — Telehealth: Payer: Self-pay

## 2018-09-13 LAB — NOVEL CORONAVIRUS, NAA (HOSP ORDER, SEND-OUT TO REF LAB; TAT 18-24 HRS): SARS-CoV-2, NAA: DETECTED — AB

## 2018-09-13 NOTE — Telephone Encounter (Signed)
Copied from [place] [id number]. Topic: General - Other >> Sep 13, 2018 11:25 AM Keene Breath wrote: Reason for CRM: Patient called to ask the nurse or doctor to call her regarding her positive COVID test.  Patient is not sure what she should do at this time.  CB# [phone]

## 2018-09-13 NOTE — Telephone Encounter (Signed)
lvm for pt to call back.  

## 2018-09-14 NOTE — Telephone Encounter (Signed)
Patient has called back in regard.  She wants to make sure she is doing the right thing for her symptoms.  Please follow back up.

## 2018-09-14 NOTE — Telephone Encounter (Signed)
Spoke to pt - informed of comfort care for symptoms and encouraged her to treat symptoms at home and also informed that if she begins to have difficulty breathing, chest pain or sob to go straight to the [facility] ED.

## 2018-11-05 DIAGNOSIS — M25572 Pain in left ankle and joints of left foot: Secondary | ICD-10-CM | POA: Diagnosis not present

## 2018-11-05 DIAGNOSIS — M722 Plantar fascial fibromatosis: Secondary | ICD-10-CM | POA: Diagnosis not present

## 2018-11-12 ENCOUNTER — Other Ambulatory Visit: Payer: Self-pay | Admitting: Internal Medicine

## 2018-11-12 DIAGNOSIS — M5416 Radiculopathy, lumbar region: Secondary | ICD-10-CM

## 2018-11-12 DIAGNOSIS — G8929 Other chronic pain: Secondary | ICD-10-CM

## 2018-11-12 DIAGNOSIS — M2550 Pain in unspecified joint: Secondary | ICD-10-CM

## 2018-11-12 DIAGNOSIS — M7062 Trochanteric bursitis, left hip: Secondary | ICD-10-CM

## 2018-11-12 DIAGNOSIS — M545 Low back pain, unspecified: Secondary | ICD-10-CM

## 2018-11-12 DIAGNOSIS — M7551 Bursitis of right shoulder: Secondary | ICD-10-CM

## 2018-11-23 ENCOUNTER — Encounter: Payer: Self-pay | Admitting: Internal Medicine

## 2018-12-03 ENCOUNTER — Encounter: Payer: Self-pay | Admitting: Internal Medicine

## 2018-12-03 DIAGNOSIS — Z5181 Encounter for therapeutic drug level monitoring: Secondary | ICD-10-CM | POA: Diagnosis not present

## 2018-12-24 DIAGNOSIS — M7672 Peroneal tendinitis, left leg: Secondary | ICD-10-CM | POA: Diagnosis not present

## 2018-12-24 DIAGNOSIS — M7662 Achilles tendinitis, left leg: Secondary | ICD-10-CM | POA: Diagnosis not present

## 2018-12-25 ENCOUNTER — Other Ambulatory Visit: Payer: Self-pay | Admitting: Internal Medicine

## 2018-12-25 DIAGNOSIS — F3342 Major depressive disorder, recurrent, in full remission: Secondary | ICD-10-CM

## 2018-12-26 ENCOUNTER — Encounter: Payer: Self-pay | Admitting: Internal Medicine

## 2018-12-26 ENCOUNTER — Other Ambulatory Visit: Payer: Self-pay

## 2018-12-26 ENCOUNTER — Other Ambulatory Visit (INDEPENDENT_AMBULATORY_CARE_PROVIDER_SITE_OTHER): Payer: Medicare Other

## 2018-12-26 ENCOUNTER — Ambulatory Visit (INDEPENDENT_AMBULATORY_CARE_PROVIDER_SITE_OTHER): Payer: Medicare Other | Admitting: Internal Medicine

## 2018-12-26 VITALS — BP 130/72 | HR 77 | Temp 98.3°F | Resp 16 | Ht 66.0 in | Wt 211.0 lb

## 2018-12-26 DIAGNOSIS — I493 Ventricular premature depolarization: Secondary | ICD-10-CM | POA: Insufficient documentation

## 2018-12-26 DIAGNOSIS — I10 Essential (primary) hypertension: Secondary | ICD-10-CM | POA: Diagnosis not present

## 2018-12-26 DIAGNOSIS — R7303 Prediabetes: Secondary | ICD-10-CM

## 2018-12-26 DIAGNOSIS — Z0000 Encounter for general adult medical examination without abnormal findings: Secondary | ICD-10-CM

## 2018-12-26 DIAGNOSIS — B368 Other specified superficial mycoses: Secondary | ICD-10-CM | POA: Diagnosis not present

## 2018-12-26 DIAGNOSIS — Z0001 Encounter for general adult medical examination with abnormal findings: Secondary | ICD-10-CM | POA: Diagnosis not present

## 2018-12-26 DIAGNOSIS — E7800 Pure hypercholesterolemia, unspecified: Secondary | ICD-10-CM

## 2018-12-26 DIAGNOSIS — E538 Deficiency of other specified B group vitamins: Secondary | ICD-10-CM

## 2018-12-26 DIAGNOSIS — N1831 Chronic kidney disease, stage 3a: Secondary | ICD-10-CM | POA: Diagnosis not present

## 2018-12-26 DIAGNOSIS — R002 Palpitations: Secondary | ICD-10-CM | POA: Diagnosis not present

## 2018-12-26 DIAGNOSIS — Z23 Encounter for immunization: Secondary | ICD-10-CM | POA: Diagnosis not present

## 2018-12-26 LAB — HEPATIC FUNCTION PANEL
ALT: 22 U/L (ref 0–35)
AST: 17 U/L (ref 0–37)
Albumin: 4.3 g/dL (ref 3.5–5.2)
Alkaline Phosphatase: 73 U/L (ref 39–117)
Bilirubin, Direct: 0.1 mg/dL (ref 0.0–0.3)
Total Bilirubin: 0.4 mg/dL (ref 0.2–1.2)
Total Protein: 7.1 g/dL (ref 6.0–8.3)

## 2018-12-26 LAB — BASIC METABOLIC PANEL
BUN: 31 mg/dL — ABNORMAL HIGH (ref 6–23)
CO2: 27 mEq/L (ref 19–32)
Calcium: 9.2 mg/dL (ref 8.4–10.5)
Chloride: 105 mEq/L (ref 96–112)
Creatinine, Ser: 1.14 mg/dL (ref 0.40–1.20)
GFR: 47.35 mL/min — ABNORMAL LOW (ref >60.00)
Glucose, Bld: 98 mg/dL (ref 70–99)
Potassium: 4.2 mEq/L (ref 3.5–5.1)
Sodium: 139 mEq/L (ref 135–145)

## 2018-12-26 LAB — CBC WITH DIFFERENTIAL/PLATELET
Basophils Absolute: 0.1 10*3/uL (ref 0.0–0.1)
Basophils Relative: 0.8 % (ref 0.0–3.0)
Eosinophils Absolute: 0.3 10*3/uL (ref 0.0–0.7)
Eosinophils Relative: 5 % (ref 0.0–5.0)
HCT: 37.1 % (ref 36.0–46.0)
Hemoglobin: 12.3 g/dL (ref 12.0–15.0)
Lymphocytes Relative: 26.7 % (ref 12.0–46.0)
Lymphs Abs: 1.8 10*3/uL (ref 0.7–4.0)
MCHC: 33.1 g/dL (ref 30.0–36.0)
MCV: 84.2 fl (ref 78.0–100.0)
Monocytes Absolute: 0.6 10*3/uL (ref 0.1–1.0)
Monocytes Relative: 9.4 % (ref 3.0–12.0)
Neutro Abs: 3.8 10*3/uL (ref 1.4–7.7)
Neutrophils Relative %: 58.1 % (ref 43.0–77.0)
Platelets: 257 10*3/uL (ref 150.0–400.0)
RBC: 4.41 Mil/uL (ref 3.87–5.11)
RDW: 14.5 % (ref 11.5–15.5)
WBC: 6.6 10*3/uL (ref 4.0–10.5)

## 2018-12-26 LAB — LIPID PANEL
Cholesterol: 195 mg/dL (ref 0–200)
HDL: 45.1 mg/dL (ref >39.00)
LDL Cholesterol: 111 mg/dL — ABNORMAL HIGH (ref 0–99)
NonHDL: 150.06
Total CHOL/HDL Ratio: 4
Triglycerides: 196 mg/dL — ABNORMAL HIGH (ref 0.0–149.0)
VLDL: 39.2 mg/dL (ref 0.0–40.0)

## 2018-12-26 LAB — TSH: TSH: 1.17 u[IU]/mL (ref 0.35–4.50)

## 2018-12-26 LAB — FOLATE: Folate: 19.9 ng/mL (ref >5.9)

## 2018-12-26 LAB — HEMOGLOBIN A1C: Hgb A1c MFr Bld: 6.1 % (ref 4.6–6.5)

## 2018-12-26 LAB — VITAMIN B12: Vitamin B-12: 1488 pg/mL — ABNORMAL HIGH (ref 211–911)

## 2018-12-26 MED ORDER — TERBINAFINE HCL 250 MG PO TABS: 250.0000 mg | ORAL_TABLET | Freq: Every day | ORAL | 0 refills | Status: AC

## 2018-12-26 NOTE — Patient Instructions (Signed)
Palpitations Palpitations are feelings that your heartbeat is irregular or is faster than normal. It may feel like your heart is fluttering or skipping a beat. Palpitations are usually not a serious problem. They may be caused by many things, including smoking, caffeine, alcohol, stress, and certain medicines or drugs. Most causes of palpitations are not serious. However, some palpitations can be a sign of a serious problem. You may need further tests to rule out serious medical problems. Follow these instructions at home:     Pay attention to any changes in your condition. Take these actions to help manage your symptoms: Eating and drinking  Avoid foods and drinks that may cause palpitations. These may include: ? Caffeinated coffee, tea, soft drinks, diet pills, and energy drinks. ? Chocolate. ? Alcohol. Lifestyle  Take steps to reduce your stress and anxiety. Things that can help you relax include: ? Yoga. ? Mind-body activities, such as deep breathing, meditation, or using words and images to create positive thoughts (guided imagery). ? Physical activity, such as swimming, jogging, or walking. Tell your health care provider if your palpitations increase with activity. If you have chest pain or shortness of breath with activity, do not continue the activity until you are seen by your health care provider. ? Biofeedback. This is a method that helps you learn to use your mind to control things in your body, such as your heartbeat.  Do not use drugs, including cocaine or ecstasy. Do not use marijuana.  Get plenty of rest and sleep. Keep a regular bed time. General instructions  Take over-the-counter and prescription medicines only as told by your health care provider.  Do not use any products that contain nicotine or tobacco, such as cigarettes and e-cigarettes. If you need help quitting, ask your health care provider.  Keep all follow-up visits as told by your health care provider. This  is important. These may include visits for further testing if palpitations do not go away or get worse. Contact a health care provider if you:  Continue to have a fast or irregular heartbeat after 24 hours.  Notice that your palpitations occur more often. Get help right away if you:  Have chest pain or shortness of breath.  Have a severe headache.  Feel dizzy or you faint. Summary  Palpitations are feelings that your heartbeat is irregular or is faster than normal. It may feel like your heart is fluttering or skipping a beat.  Palpitations may be caused by many things, including smoking, caffeine, alcohol, stress, certain medicines, and drugs.  Although most causes of palpitations are not serious, some causes can be a sign of a serious medical problem.  Get help right away if you faint or have chest pain, shortness of breath, a severe headache, or dizziness. This information is not intended to replace advice given to you by your health care provider. Make sure you discuss any questions you have with your health care provider. Document Released: 01/29/2000 Document Revised: 03/15/2017 Document Reviewed: 03/15/2017 Elsevier Patient Education  2020 [employer].

## 2018-12-26 NOTE — Progress Notes (Signed)
Subjective:  Patient ID: Kelsey Church, female    DOB: [birth year]  Age: 68 y.o. MRN: [id number]  CC: Annual Exam, Hypertension, Rash, and Hyperlipidemia   HPI Tyiesha H Leibensperger presents for a CPX.  She was diagnosed with COVID-19 infection about 3 months ago.  Since then she has noticed an elevated heart rate when she is lying in the bed.  She says it happens every night.  She does not experience dizziness or lightheadedness with this.  She denies exertional symptoms such as CP or DOE.  She also complains of rash in her armpits.  It is more prominent on the left than the right.  Initially it felt irritated so she has been using topical over-the-counter steroids for 2 months and tells me the rash feels better but has not resolved.   Outpatient Medications Prior to Visit  Medication Sig Dispense Refill  . acetaminophen (TYLENOL) 650 MG CR tablet Take 650 mg by mouth every 8 (eight) hours as needed for pain.    . cetirizine (ZYRTEC) 10 MG tablet Take 10 mg by mouth daily.    . citalopram (CELEXA) 20 MG tablet TAKE 1 TABLET AT BEDTIME 90 tablet 0  . estradiol (CLIMARA - DOSED IN MG/24 HR) 0.05 mg/24hr patch Place 1 patch (0.05 mg total) onto the skin once a week. 12 patch 4  . furosemide (LASIX) 20 MG tablet Take 1 tablet (20 mg total) by mouth daily. 90 tablet 1  . gabapentin (NEURONTIN) 300 MG capsule Take 300 mg by mouth 2 (two) times daily.    Marland Kitchen losartan (COZAAR) 100 MG tablet Take 1 tablet (100 mg total) by mouth daily. 90 tablet 1  . progesterone (PROMETRIUM) 100 MG capsule Take 1 capsule (100 mg total) by mouth at bedtime. 90 capsule 4  . meloxicam (MOBIC) 15 MG tablet TAKE 1 TABLET DAILY 90 tablet 3   No facility-administered medications prior to visit.     ROS Review of Systems  Constitutional: Negative.  Negative for chills, diaphoresis, fatigue and fever.  HENT: Negative.   Eyes: Negative for visual disturbance.  Respiratory: Negative for cough, chest tightness,  shortness of breath and wheezing.   Cardiovascular: Positive for palpitations. Negative for chest pain and leg swelling.  Gastrointestinal: Negative for abdominal pain, constipation, diarrhea, nausea and vomiting.  Endocrine: Negative.   Genitourinary: Negative.  Negative for difficulty urinating.  Musculoskeletal: Positive for arthralgias. Negative for back pain and myalgias.  Skin: Positive for rash. Negative for color change and pallor.  Neurological: Negative.  Negative for dizziness, weakness, light-headedness and headaches.  Hematological: Negative for adenopathy. Does not bruise/bleed easily.  Psychiatric/Behavioral: Negative.     Objective:  BP 130/72 (BP Location: Left Arm, Patient Position: Sitting, Cuff Size: Large)   Pulse 77   Temp 98.3 F (36.8 C) (Oral)   Resp 16   Ht 5\' 6"  (1.676 m)   Wt 211 lb (95.7 kg)   SpO2 98%   BMI 34.06 kg/m   BP Readings from Last 3 Encounters:  12/26/18 130/72  09/10/18 131/71  06/07/18 118/62    Wt Readings from Last 3 Encounters:  12/26/18 211 lb (95.7 kg)  09/10/18 200 lb (90.7 kg)  06/07/18 209 lb (94.8 kg)    Physical Exam Vitals signs reviewed.  Constitutional:      Appearance: Normal appearance. She is obese.  HENT:     Nose: Nose normal.     Mouth/Throat:     Mouth: Mucous membranes are moist.  Eyes:     General: No scleral icterus.    Conjunctiva/sclera: Conjunctivae normal.  Neck:     Musculoskeletal: Neck supple.  Cardiovascular:     Rate and Rhythm: Normal rate and regular rhythm.     Heart sounds: Normal heart sounds, S1 normal and S2 normal. No murmur. No gallop.      Comments: EKG ----  Sinus  Rhythm  WITHIN NORMAL LIMITS Pulmonary:     Effort: Pulmonary effort is normal.     Breath sounds: No stridor. No wheezing, rhonchi or rales.  Abdominal:     General: Abdomen is protuberant. Bowel sounds are normal. There is no distension.     Palpations: Abdomen is soft. There is no hepatomegaly or  splenomegaly.     Tenderness: There is no abdominal tenderness.  Musculoskeletal:     Right lower leg: No edema.     Left lower leg: No edema.  Lymphadenopathy:     Cervical: No cervical adenopathy.  Skin:    General: Skin is warm.     Findings: Rash present.     Comments: In both axilla, more prominent on the left than the right there are serpiginous areas of erythema.  The edges are not raised.  See photo.  There is no scale, vesicles, pustules, induration, warmth, or streaking.  Neurological:     Mental Status: She is alert.  Psychiatric:        Mood and Affect: Mood normal.        Behavior: Behavior normal.     Lab Results  Component Value Date   WBC 6.6 12/26/2018   HGB 12.3 12/26/2018   HCT 37.1 12/26/2018   PLT 257.0 12/26/2018   GLUCOSE 98 12/26/2018   CHOL 195 12/26/2018   TRIG 196.0 (H) 12/26/2018   HDL 45.10 12/26/2018   LDLCALC 111 (H) 12/26/2018   ALT 22 12/26/2018   AST 17 12/26/2018   NA 139 12/26/2018   K 4.2 12/26/2018   CL 105 12/26/2018   CREATININE 1.14 12/26/2018   BUN 31 (H) 12/26/2018   CO2 27 12/26/2018   TSH 1.17 12/26/2018   INR 1.17 12/27/2013   HGBA1C 6.1 12/26/2018    Dg Chest Port 1 View  Result Date: 09/10/2018 CLINICAL DATA:  Cough with fatigue and decreased taste sensation. Recent travel. EXAM: PORTABLE CHEST 1 VIEW COMPARISON:  Radiographs 06/06/2013. FINDINGS: 1739 hours. The heart size and mediastinal contours are normal. The lungs are clear. There is no pleural effusion or pneumothorax. No acute osseous findings are identified. IMPRESSION: Stable chest.  No active cardiopulmonary process. Electronically Signed   By: Richardean Sale M.D.   On: 09/10/2018 18:17    Assessment & Plan:   Lataisha was seen today for annual exam, hypertension, rash and hyperlipidemia.  Diagnoses and all orders for this visit:  Need for influenza vaccination -     Flu Vaccine QUAD High Dose(Fluad)  Essential hypertension, benign- Her blood pressure  is adequately well controlled.  Electrolytes and renal function are normal. -     Basic metabolic panel; Future -     TSH; Future  Prediabetes- Her A1c is at 6.1%.  Medical therapy is not indicated. -     Basic metabolic panel; Future -     Hemoglobin A1c; Future  Pure hypercholesterolemia- Her ASCVD risk score is less than 15%.  I do not recommend a statin for CV risk reduction. -     Lipid panel; Future -     TSH;  Future -     Hepatic function panel; Future  Routine general medical examination at a health care facility- Exam completed, labs reviewed, vaccines reviewed and updated, she tells me that Pap/bone density/mammogram are scheduled soon with her gynecologist, colon cancer screening is up-to-date.  Vitamin B12 deficiency -     CBC with Differential; Future -     B12; Future -     Folate; Future  Tinea incognito -     terbinafine (LAMISIL) 250 MG tablet; Take 1 tablet (250 mg total) by mouth daily for 14 days.  Rapid palpitations- I have asked her to wear a 48-hour Holter monitor to try to identify the rhythm when she feels her heart rate is elevated. -     Holter monitor - 48 hour; Future -     EKG 12-Lead  Stage 3a chronic kidney disease- I have asked her to avoid anti-inflammatories in light of this new diagnosis.   I have discontinued Temesha H. Winnick "Sue"'s meloxicam. I am also having her start on terbinafine. Additionally, I am having her maintain her acetaminophen, cetirizine, estradiol, progesterone, furosemide, losartan, citalopram, and gabapentin.  Meds ordered this encounter  Medications  . terbinafine (LAMISIL) 250 MG tablet    Sig: Take 1 tablet (250 mg total) by mouth daily for 14 days.    Dispense:  14 tablet    Refill:  0     Follow-up: Return in about 2 months (around 02/25/2019).  [name], MD

## 2018-12-27 ENCOUNTER — Encounter: Payer: Self-pay | Admitting: Internal Medicine

## 2018-12-28 NOTE — Addendum Note (Signed)
Addended by: Aviva Signs M on: 12/28/2018 10:04 AM   Modules accepted: Orders

## 2019-01-02 ENCOUNTER — Other Ambulatory Visit: Payer: Self-pay | Admitting: Internal Medicine

## 2019-01-02 DIAGNOSIS — I10 Essential (primary) hypertension: Secondary | ICD-10-CM

## 2019-01-04 ENCOUNTER — Telehealth: Payer: Self-pay

## 2019-01-04 DIAGNOSIS — S86312A Strain of muscle(s) and tendon(s) of peroneal muscle group at lower leg level, left leg, initial encounter: Secondary | ICD-10-CM | POA: Diagnosis not present

## 2019-01-04 DIAGNOSIS — M7662 Achilles tendinitis, left leg: Secondary | ICD-10-CM | POA: Diagnosis not present

## 2019-01-04 NOTE — Telephone Encounter (Signed)
3 day ZIO ordered and mailed to pt. Order was switched from [place] to [place] since we are not bringing pt's in the office at this time.

## 2019-01-07 ENCOUNTER — Other Ambulatory Visit: Payer: Self-pay

## 2019-01-14 DIAGNOSIS — Z5181 Encounter for therapeutic drug level monitoring: Secondary | ICD-10-CM | POA: Diagnosis not present

## 2019-01-17 ENCOUNTER — Ambulatory Visit (INDEPENDENT_AMBULATORY_CARE_PROVIDER_SITE_OTHER): Payer: Medicare Other

## 2019-01-17 DIAGNOSIS — R002 Palpitations: Secondary | ICD-10-CM

## 2019-02-07 ENCOUNTER — Encounter: Payer: Self-pay | Admitting: Internal Medicine

## 2019-02-11 ENCOUNTER — Other Ambulatory Visit: Payer: Self-pay

## 2019-02-12 ENCOUNTER — Encounter: Payer: Medicare Other | Admitting: Obstetrics & Gynecology

## 2019-02-12 ENCOUNTER — Encounter: Payer: Self-pay | Admitting: Obstetrics & Gynecology

## 2019-02-12 ENCOUNTER — Ambulatory Visit (INDEPENDENT_AMBULATORY_CARE_PROVIDER_SITE_OTHER): Payer: Medicare Other | Admitting: Obstetrics & Gynecology

## 2019-02-12 VITALS — BP 120/82 | Ht 66.0 in | Wt 202.0 lb

## 2019-02-12 DIAGNOSIS — Z01419 Encounter for gynecological examination (general) (routine) without abnormal findings: Secondary | ICD-10-CM | POA: Diagnosis not present

## 2019-02-12 DIAGNOSIS — E559 Vitamin D deficiency, unspecified: Secondary | ICD-10-CM | POA: Diagnosis not present

## 2019-02-12 DIAGNOSIS — Z13820 Encounter for screening for osteoporosis: Secondary | ICD-10-CM

## 2019-02-12 DIAGNOSIS — E6609 Other obesity due to excess calories: Secondary | ICD-10-CM

## 2019-02-12 DIAGNOSIS — E66811 Obesity, class 1: Secondary | ICD-10-CM

## 2019-02-12 DIAGNOSIS — Z6832 Body mass index (BMI) 32.0-32.9, adult: Secondary | ICD-10-CM

## 2019-02-12 DIAGNOSIS — Z780 Asymptomatic menopausal state: Secondary | ICD-10-CM

## 2019-02-12 MED ORDER — PROGESTERONE MICRONIZED 100 MG PO CAPS: 100.0000 mg | ORAL_CAPSULE | Freq: Every day | ORAL | 4 refills | Status: DC

## 2019-02-12 MED ORDER — ESTRADIOL 0.05 MG/24HR TD PTWK: 0.0500 mg | MEDICATED_PATCH | TRANSDERMAL | 4 refills | Status: DC

## 2019-02-12 NOTE — Patient Instructions (Signed)
1. Well female exam with routine gynecological exam Normal gynecologic exam in menopause.  Abstinent.  Pap test December 2018 was negative, no indication to repeat this year.  Breast exam normal.  Will obtain report of last screening mammogram.  Colonoscopy 2016.  Health labs with family physician in November 2020.  2. Postmenopause Well on hormone replacement therapy.  Will continue on estradiol patch and progesterone at this time.  No postmenopausal bleeding.  Prescriptions sent to pharmacy.  3. Screening for osteoporosis Vitamin D level done today.  Not currently taking vitamin D supplements.  Will schedule a bone density here now.  Will make recommendations per results.  Calcium intake of 1200 mg daily and regular weightbearing physical activities to increase. - VITAMIN D 25 Hydroxy (Vit-D Deficiency, Fractures) - DG Bone Density; Future  4. Class 1 obesity due to excess calories without serious comorbidity with body mass index (BMI) of 32.0 to 32.9 in adult  Other orders - progesterone (PROMETRIUM) 100 MG capsule; Take 1 capsule (100 mg total) by mouth at bedtime. - estradiol (CLIMARA - DOSED IN MG/24 HR) 0.05 mg/24hr patch; Place 1 patch (0.05 mg total) onto the skin once a week.  Kelsey Church, it was a pleasure seeing you today!  I will inform you of your results as soon as they are available.

## 2019-02-12 NOTE — Progress Notes (Signed)
Kelsey Church October 26, 1950 [id number]   History:    68 y.o. G2P1A1L1 Widowed  RP:  Established patient presenting for annual gyn exam   HPI: Menopause with persisting hot flashes and night sweats.  Has try to wean the estradiol patch without success.  On estradiol 0.05 weekly and Prometrium 100 mg at bedtime.  No postmenopausal bleeding.  No pelvic pain.  Abstinent.  Pap 01/2017 Negative.  Asymptomatic small Uterine Fibroids documented by Pelvic US 02/2018.  Endometrial line was thin.  Rt Ovary surgically absent.  Lt ovary normal.   Urine and bowel movements normal.  Breasts normal.  Will obtain report of last Screening Mammo.  Body mass index stable at 32.6.  Walking her dogs.  Limited by Achilles Tendinitis.  Health labs with family physician 12/2018.  Last BD 2016.  Colono 2016.     Past medical history,surgical history, family history and social history were all reviewed and documented in the EPIC chart.  Gynecologic History No LMP recorded. Patient is postmenopausal.  Obstetric History OB History  Gravida Para Term Preterm AB Living  2 1   1  0 1  SAB TAB Ectopic Multiple Live Births      0        # Outcome Date GA Lbr Len/2nd Weight Sex Delivery Anes PTL Lv  2 Gravida           1 Preterm              ROS: A ROS was performed and pertinent positives and negatives are included in the history.  GENERAL: No fevers or chills. HEENT: No change in vision, no earache, sore throat or sinus congestion. NECK: No pain or stiffness. CARDIOVASCULAR: No chest pain or pressure. No palpitations. PULMONARY: No shortness of breath, cough or wheeze. GASTROINTESTINAL: No abdominal pain, nausea, vomiting or diarrhea, melena or bright red blood per rectum. GENITOURINARY: No urinary frequency, urgency, hesitancy or dysuria. MUSCULOSKELETAL: No joint or muscle pain, no back pain, no recent trauma. DERMATOLOGIC: No rash, no itching, no lesions. ENDOCRINE: No polyuria, polydipsia, no heat or cold  intolerance. No recent change in weight. HEMATOLOGICAL: No anemia or easy bruising or bleeding. NEUROLOGIC: No headache, seizures, numbness, tingling or weakness. PSYCHIATRIC: No depression, no loss of interest in normal activity or change in sleep pattern.     Exam:   BP 120/82 (BP Location: Right Arm, Patient Position: Sitting, Cuff Size: Normal)   Ht 5\' 6"  (1.676 m)   Wt 202 lb (91.6 kg)   BMI 32.60 kg/m   Body mass index is 32.6 kg/m.  General appearance : Well developed well nourished female. No acute distress HEENT: Eyes: no retinal hemorrhage or exudates,  Neck supple, trachea midline, no carotid bruits, no thyroidmegaly Lungs: Clear to auscultation, no rhonchi or wheezes, or rib retractions  Heart: Regular rate and rhythm, no murmurs or gallops Breast:Examined in sitting and supine position were symmetrical in appearance, no palpable masses or tenderness,  no skin retraction, no nipple inversion, no nipple discharge, no skin discoloration, no axillary or supraclavicular lymphadenopathy Abdomen: no palpable masses or tenderness, no rebound or guarding Extremities: no edema or skin discoloration or tenderness  Pelvic: Vulva: Normal             Vagina: No gross lesions or discharge  Cervix: No gross lesions or discharge  Uterus  AV, normal size, shape and consistency, non-tender and mobile  Adnexa  Without masses or tenderness  Anus: Normal   Assessment/Plan:  68 y.o. female for annual exam   1. Well female exam with routine gynecological exam Normal gynecologic exam in menopause.  Abstinent.  Pap test December 2018 was negative, no indication to repeat this year.  Breast exam normal.  Will obtain report of last screening mammogram.  Colonoscopy 2016.  Health labs with family physician in November 2020.  2. Postmenopause Well on hormone replacement therapy.  Will continue on estradiol patch and progesterone at this time.  No postmenopausal bleeding.  Prescriptions sent to  pharmacy.  3. Screening for osteoporosis Vitamin D level done today.  Not currently taking vitamin D supplements.  Will schedule a bone density here now.  Will make recommendations per results.  Calcium intake of 1200 mg daily and regular weightbearing physical activities to increase. - VITAMIN D 25 Hydroxy (Vit-D Deficiency, Fractures) - DG Bone Density; Future  4. Class 1 obesity due to excess calories without serious comorbidity with body mass index (BMI) of 32.0 to 32.9 in adult  Other orders - progesterone (PROMETRIUM) 100 MG capsule; Take 1 capsule (100 mg total) by mouth at bedtime. - estradiol (CLIMARA - DOSED IN MG/24 HR) 0.05 mg/24hr patch; Place 1 patch (0.05 mg total) onto the skin once a week.   Princess Bruins MD, 4:52 PM 02/12/2019

## 2019-02-14 LAB — VITAMIN D 25 HYDROXY (VIT D DEFICIENCY, FRACTURES): Vit D, 25-Hydroxy: 29 ng/mL — ABNORMAL LOW (ref 30–100)

## 2019-02-22 ENCOUNTER — Other Ambulatory Visit: Payer: Self-pay | Admitting: Internal Medicine

## 2019-02-22 DIAGNOSIS — I10 Essential (primary) hypertension: Secondary | ICD-10-CM

## 2019-02-27 ENCOUNTER — Other Ambulatory Visit: Payer: Self-pay

## 2019-02-28 ENCOUNTER — Ambulatory Visit (INDEPENDENT_AMBULATORY_CARE_PROVIDER_SITE_OTHER): Payer: Medicare Other

## 2019-02-28 ENCOUNTER — Other Ambulatory Visit: Payer: Self-pay | Admitting: Obstetrics & Gynecology

## 2019-02-28 DIAGNOSIS — Z780 Asymptomatic menopausal state: Secondary | ICD-10-CM

## 2019-02-28 DIAGNOSIS — Z13820 Encounter for screening for osteoporosis: Secondary | ICD-10-CM

## 2019-03-04 ENCOUNTER — Telehealth: Payer: Self-pay | Admitting: Internal Medicine

## 2019-03-04 ENCOUNTER — Other Ambulatory Visit: Payer: Self-pay | Admitting: Internal Medicine

## 2019-03-04 DIAGNOSIS — I493 Ventricular premature depolarization: Secondary | ICD-10-CM

## 2019-03-04 NOTE — Telephone Encounter (Signed)
Monitor reviewed by Dr Lovena Le 02/07/19.  Results have been faxed and sent to Dr. Ronnald Ramp in basket.  Referred patient to contact Dr. Ronnald Ramp, (ordering physician) for test results.

## 2019-03-04 NOTE — Telephone Encounter (Signed)
Patient is calling with concerns regarding monitor results from 01/17/19. Please call.

## 2019-03-11 ENCOUNTER — Telehealth: Payer: Self-pay

## 2019-03-11 DIAGNOSIS — Z5181 Encounter for therapeutic drug level monitoring: Secondary | ICD-10-CM | POA: Diagnosis not present

## 2019-03-11 NOTE — Telephone Encounter (Signed)
error 

## 2019-03-12 ENCOUNTER — Ambulatory Visit: Payer: Medicare Other | Admitting: Cardiology

## 2019-03-14 DIAGNOSIS — Z7189 Other specified counseling: Secondary | ICD-10-CM | POA: Insufficient documentation

## 2019-03-14 NOTE — Progress Notes (Signed)
Cardiology Office Note   Date:  03/15/2019   ID:  Kelsey Church, DOB 19-Apr-1950, MRN [id number]  PCP:  Janith Lima, MD  Cardiologist:   Minus Breeding, MD Referring:  Janith Lima, MD  Chief Complaint  Patient presents with  . Shortness of Breath  . Chest Pain      History of Present Illness: Kelsey Church is a 69 y.o. female who was referred by Janith Lima, MD for evaluation of palpitations.  She has no past cardiac history.  She does have a strong history of coronary artery disease.  She had Covid in July.  Lost her taste.  She lost smell.  She had fevers.  She has been coughing.  She was sick for least 2-1/2 weeks.  She still feels slightly uncomfortable in her chest since September.  She feels "like she needs to stretch.  At night she is feeling some strong fast heartbeats.  It is a little bit less this week.  It last 1 to 2 minutes when it happens.  She has to sit up on 2 pillows.  She might drink some water.  She did wear a monitor and I reviewed this that was ordered by her primary provider.  She had PACs and PVCs.  Is not overly active but she does some activities such as walking the dog.  She is not bringing on symptoms with this.  She is not having any classic PND or orthopnea.  Has had no weight gain or edema.  She has had no presyncope or syncope.   Past Medical History:  Diagnosis Date  . Arthritis    PAIN AND OA LEFT KNEE; S/P RIGHT TOTAL KNEE ARTHROPLASTY 06/10/13  . Cancer ([place])    melanoma in 2 sights  . Complication of anesthesia    "chills every evening at sundown x 2 weeks"- no fever  . Depression   . Dysphagia   . History of shingles    NO RESIDUAL PROBLEMS  . Hypertension   . Pain    LOWER BACK PAIN  . Sleep apnea    CPAP    Past Surgical History:  Procedure Laterality Date  . APPENDECTOMY  1958  . COLONOSCOPY    . COLONOSCOPY W/ POLYPECTOMY     7 polyps  . FOOT SURGERY Right    removal plantar wart  . GALLBLADDER SURGERY   02/14/1990  . INJECTION KNEE  09/2011   right   . KNEE ARTHROSCOPY  1996  . MELANOMA EXCISION  1992   right chin, back  . POLYPECTOMY    . SALPINGOOPHORECTOMY  2009   Right  . TOTAL KNEE ARTHROPLASTY Right 06/10/2013   Procedure: RIGHT TOTAL KNEE ARTHROPLASTY;  Surgeon: [name], MD;  Location: [facility];  Service: Orthopedics;  Laterality: Right;  . TOTAL KNEE ARTHROPLASTY Left 12/16/2013   Procedure: LEFT TOTAL KNEE ARTHROPLASTY;  Surgeon: [name], MD;  Location: [facility];  Service: Orthopedics;  Laterality: Left;     Current Outpatient Medications  Medication Sig Dispense Refill  . acetaminophen (TYLENOL) 650 MG CR tablet Take 650 mg by mouth every 8 (eight) hours as needed for pain.    . cetirizine (ZYRTEC) 10 MG tablet Take 10 mg by mouth daily.    . citalopram (CELEXA) 20 MG tablet TAKE 1 TABLET AT BEDTIME 90 tablet 0  . estradiol (CLIMARA - DOSED IN MG/24 HR) 0.05 mg/24hr patch Place 1 patch (0.05 mg total) onto the skin  once a week. 12 patch 4  . furosemide (LASIX) 20 MG tablet TAKE 1 TABLET DAILY 90 tablet 1  . gabapentin (NEURONTIN) 300 MG capsule Take 300 mg by mouth 2 (two) times daily.    Marland Kitchen losartan (COZAAR) 100 MG tablet TAKE 1 TABLET DAILY 90 tablet 1  . progesterone (PROMETRIUM) 100 MG capsule Take 1 capsule (100 mg total) by mouth at bedtime. 90 capsule 4   No current facility-administered medications for this visit.    Allergies:   Patient has no known allergies.    Social History:  The patient  reports that she has never smoked. She has never used smokeless tobacco. She reports that she does not drink alcohol or use drugs.   Family History:  The patient's family history includes Breast cancer in her maternal grandmother and mother; Cancer in her maternal grandmother; Colon polyps in her sister; Diabetes in her sister; Heart attack (age of onset: 73) in her father; Heart disease in her father; Hypertension in her father and sister; Osteoporosis in her  mother; Thyroid cancer in her maternal grandmother.    ROS:  Please see the history of present illness.   Otherwise, review of systems are positive for none.   All other systems are reviewed and negative.    PHYSICAL EXAM: VS:  BP 102/64   Pulse 77   Temp (!) 97 F (36.1 C)   Ht 5\' 6"  (1.676 m)   Wt 199 lb (90.3 kg)   BMI 32.12 kg/m  , BMI Body mass index is 32.12 kg/m. GENERAL:  Well appearing HEENT:  Pupils equal round and reactive, fundi not visualized, oral mucosa unremarkable NECK:  No jugular venous distention, waveform within normal limits, carotid upstroke brisk and symmetric, no bruits, no thyromegaly LYMPHATICS:  No cervical, inguinal adenopathy LUNGS:  Clear to auscultation bilaterally BACK:  No CVA tenderness CHEST:  Unremarkable HEART:  PMI not displaced or sustained,S1 and S2 within normal limits, no S3, no S4, no clicks, no rubs, no murmurs ABD:  Flat, positive bowel sounds normal in frequency in pitch, no bruits, no rebound, no guarding, no midline pulsatile mass, no hepatomegaly, no splenomegaly EXT:  2 plus pulses throughout, no edema, no cyanosis no clubbing SKIN:  No rashes no nodules NEURO:  Cranial nerves II through XII grossly intact, motor grossly intact throughout PSYCH:  Cognitively intact, oriented to person place and time    EKG:  EKG is ordered today. The ekg ordered today demonstrates sinus rhythm, rate 77, axis within normal limits, no ST-T wave changes.   Recent Labs: 12/26/2018: ALT 22; BUN 31; Creatinine, Ser 1.14; Hemoglobin 12.3; Platelets 257.0; Potassium 4.2; Sodium 139; TSH 1.17    Lipid Panel    Component Value Date/Time   CHOL 195 12/26/2018 0947   TRIG 196.0 (H) 12/26/2018 0947   HDL 45.10 12/26/2018 0947   CHOLHDL 4 12/26/2018 0947   VLDL 39.2 12/26/2018 0947   LDLCALC 111 (H) 12/26/2018 0947      Wt Readings from Last 3 Encounters:  03/15/19 199 lb (90.3 kg)  02/12/19 202 lb (91.6 kg)  12/26/18 211 lb (95.7 kg)       Other studies Reviewed: Additional studies/ records that were reviewed today include: Event monitor strips personally reviewed. Review of the above records demonstrates:  Please see elsewhere in the note.     ASSESSMENT AND PLAN:  PALPITATION: I will make sure she has a structurally normal heart.  She has PACs and PVCs on the monitor.  She would like to manage these conservatively unless she has increasing symptoms.  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  DYSLIPIDEMIA: Triglycerides were 196.  HDL 45.  LDL is 111.  Will be screening as above and goals of therapy will be based on these results.  SLEEP APNEA: She has is managing CPAP.  DOE: Given this and her Covid I will check an echocardiogram.  CHEST PAIN: Her chest pain is very atypical but she has a very strong family history.  I will order a POET (Plain Old Exercise Treadmill)   Current medicines are reviewed at length with the patient today.  The patient does not have concerns regarding medicines.  The following changes have been made:  no change  Labs/ tests ordered today include:   Orders Placed This Encounter  Procedures  . EXERCISE TOLERANCE TEST (ETT)  . EKG 12-Lead  . ECHOCARDIOGRAM COMPLETE     Disposition:   FU with me as needed.     Signed, Minus Breeding, MD  03/15/2019 2:24 PM    [place]

## 2019-03-15 ENCOUNTER — Other Ambulatory Visit: Payer: Self-pay

## 2019-03-15 ENCOUNTER — Encounter: Payer: Self-pay | Admitting: *Deleted

## 2019-03-15 ENCOUNTER — Encounter: Payer: Self-pay | Admitting: Cardiology

## 2019-03-15 ENCOUNTER — Ambulatory Visit (INDEPENDENT_AMBULATORY_CARE_PROVIDER_SITE_OTHER): Payer: Medicare Other | Admitting: Cardiology

## 2019-03-15 VITALS — BP 102/64 | HR 77 | Temp 97.0°F | Ht 66.0 in | Wt 199.0 lb

## 2019-03-15 DIAGNOSIS — R0602 Shortness of breath: Secondary | ICD-10-CM

## 2019-03-15 DIAGNOSIS — Z7189 Other specified counseling: Secondary | ICD-10-CM

## 2019-03-15 DIAGNOSIS — N1831 Chronic kidney disease, stage 3a: Secondary | ICD-10-CM | POA: Diagnosis not present

## 2019-03-15 DIAGNOSIS — R002 Palpitations: Secondary | ICD-10-CM | POA: Diagnosis not present

## 2019-03-15 NOTE — Patient Instructions (Signed)
Medication Instructions:  No Changes *If you need a refill on your cardiac medications before your next appointment, please call your pharmacy*  Lab Work: None  Testing/Procedures: Your physician has requested that you have an exercise tolerance test. For further information please visit [email or web address]. Please also follow instruction sheet, as given.  You will need a Covid Screening 3 days prior to your exercise tolerance test. You will need to self quarantine after the screening until your procedure.This is a Drive Up Visit at the [facility] [address] [place]. Someone will direct you to the appropriate testing line. Stay in your car and someone will be with you shortly   Your physician has requested that you have an echocardiogram. Echocardiography is a painless test that uses sound waves to create images of your heart. It provides your doctor with information about the size and shape of your heart and how well your heart's chambers and valves are working. This procedure takes approximately one hour. There are no restrictions for this procedure. [place]  Follow-Up: At [facility], you and your health needs are our priority.  As part of our continuing mission to provide you with exceptional heart care, we have created designated Provider Care Teams.  These Care Teams include your primary Cardiologist (physician) and Advanced Practice Providers (APPs -  Physician Assistants and Nurse Practitioners) who all work together to provide you with the care you need, when you need it.  Other Instructions Follow up as needed

## 2019-03-20 ENCOUNTER — Ambulatory Visit: Payer: Medicare Other

## 2019-03-20 ENCOUNTER — Encounter: Payer: Self-pay | Admitting: *Deleted

## 2019-03-22 ENCOUNTER — Ambulatory Visit: Payer: Medicare Other | Attending: Internal Medicine

## 2019-03-22 DIAGNOSIS — Z23 Encounter for immunization: Secondary | ICD-10-CM

## 2019-03-22 NOTE — Progress Notes (Signed)
   Covid-19 Vaccination Clinic  Name:  Kelsey Church    MRN: [id number] DOB: [birth year]  03/22/2019  [name] was observed post Covid-19 immunization for 15 minutes without incidence. She was provided with Vaccine Information Sheet and instruction to access the V-Safe system.   [name] was instructed to call 911 with any severe reactions post vaccine: Marland Kitchen Difficulty breathing  . Swelling of your face and throat  . A fast heartbeat  . A bad rash all over your body  . Dizziness and weakness    Immunizations Administered    Name Date Dose VIS Date Route   Pfizer COVID-19 Vaccine 03/22/2019  1:27 PM 0.3 mL 01/25/2019 Intramuscular   Manufacturer: [place]   Lot: [id number]   [place]: [id number]

## 2019-03-25 ENCOUNTER — Other Ambulatory Visit: Payer: Self-pay | Admitting: Internal Medicine

## 2019-03-25 DIAGNOSIS — F3342 Major depressive disorder, recurrent, in full remission: Secondary | ICD-10-CM

## 2019-03-28 ENCOUNTER — Telehealth (HOSPITAL_COMMUNITY): Payer: Self-pay

## 2019-03-28 ENCOUNTER — Ambulatory Visit (HOSPITAL_COMMUNITY): Payer: Medicare Other | Attending: Cardiology

## 2019-03-28 ENCOUNTER — Other Ambulatory Visit: Payer: Self-pay

## 2019-03-28 DIAGNOSIS — R0602 Shortness of breath: Secondary | ICD-10-CM | POA: Insufficient documentation

## 2019-03-28 DIAGNOSIS — R002 Palpitations: Secondary | ICD-10-CM

## 2019-03-28 MED ORDER — PERFLUTREN LIPID MICROSPHERE
1.0000 mL | INTRAVENOUS | Status: AC | PRN
  Administered 2019-03-28: 2 mL via INTRAVENOUS

## 2019-03-28 NOTE — Telephone Encounter (Signed)
Encounter complete. 

## 2019-03-30 ENCOUNTER — Other Ambulatory Visit (HOSPITAL_COMMUNITY): Payer: Medicare Other

## 2019-04-03 ENCOUNTER — Ambulatory Visit (HOSPITAL_COMMUNITY)
Admission: RE | Admit: 2019-04-03 | Payer: Medicare Other | Source: Ambulatory Visit | Attending: Cardiology | Admitting: Cardiology

## 2019-04-10 ENCOUNTER — Ambulatory Visit: Payer: Self-pay

## 2019-04-12 ENCOUNTER — Telehealth (HOSPITAL_COMMUNITY): Payer: Self-pay

## 2019-04-12 NOTE — Telephone Encounter (Signed)
Encounter complete. 

## 2019-04-13 ENCOUNTER — Other Ambulatory Visit (HOSPITAL_COMMUNITY)
Admission: RE | Admit: 2019-04-13 | Discharge: 2019-04-13 | Disposition: A | Discharge status: Home / Self Care | Payer: Medicare Other | Source: Ambulatory Visit | Attending: Internal Medicine | Admitting: Internal Medicine

## 2019-04-13 DIAGNOSIS — Z01812 Encounter for preprocedural laboratory examination: Secondary | ICD-10-CM | POA: Diagnosis not present

## 2019-04-13 DIAGNOSIS — Z20822 Contact with and (suspected) exposure to covid-19: Secondary | ICD-10-CM | POA: Diagnosis not present

## 2019-04-13 LAB — SARS CORONAVIRUS 2 (TAT 6-24 HRS): SARS Coronavirus 2: NEGATIVE

## 2019-04-16 ENCOUNTER — Ambulatory Visit: Payer: Medicare Other | Attending: Internal Medicine

## 2019-04-16 DIAGNOSIS — Z23 Encounter for immunization: Secondary | ICD-10-CM

## 2019-04-16 NOTE — Progress Notes (Signed)
   Covid-19 Vaccination Clinic  Name:  Kelsey Church    MRN: [id number] DOB: 1950/10/12  04/16/2019  Ms. Mole was observed post Covid-19 immunization for 15 minutes without incident. She was provided with Vaccine Information Sheet and instruction to access the V-Safe system.   [name] was instructed to call 911 with any severe reactions post vaccine: Marland Kitchen Difficulty breathing  . Swelling of face and throat  . A fast heartbeat  . A bad rash all over body  . Dizziness and weakness   Immunizations Administered    Name Date Dose VIS Date Route   Pfizer COVID-19 Vaccine 04/16/2019 12:42 PM 0.3 mL 01/25/2019 Intramuscular   Manufacturer: [place]   Lot: [id number]   [place]: [id number]

## 2019-04-17 ENCOUNTER — Ambulatory Visit (HOSPITAL_COMMUNITY)
Admission: RE | Admit: 2019-04-17 | Discharge: 2019-04-17 | Disposition: A | Discharge status: Home / Self Care | Payer: Medicare Other | Source: Ambulatory Visit | Attending: Internal Medicine | Admitting: Internal Medicine

## 2019-04-17 ENCOUNTER — Encounter (HOSPITAL_COMMUNITY): Payer: Self-pay | Admitting: *Deleted

## 2019-04-17 ENCOUNTER — Other Ambulatory Visit: Payer: Self-pay

## 2019-04-17 DIAGNOSIS — R0602 Shortness of breath: Secondary | ICD-10-CM | POA: Diagnosis not present

## 2019-04-17 DIAGNOSIS — R002 Palpitations: Secondary | ICD-10-CM | POA: Insufficient documentation

## 2019-04-17 LAB — EXERCISE TOLERANCE TEST
Estimated workload: 6.4 METS
Exercise duration (min): 4 min
Exercise duration (sec): 34 s
MPHR: 152 {beats}/min
Peak HR: 130 {beats}/min
Percent HR: 85 %
Rest HR: 73 {beats}/min

## 2019-04-17 NOTE — Progress Notes (Unsigned)
Abnormal ETT was reviewed by Dr. Debara Pickett. Patient was given the ok to be discharged.

## 2019-04-26 ENCOUNTER — Other Ambulatory Visit: Payer: Self-pay

## 2019-04-26 DIAGNOSIS — R9439 Abnormal result of other cardiovascular function study: Secondary | ICD-10-CM

## 2019-04-26 DIAGNOSIS — R9430 Abnormal result of cardiovascular function study, unspecified: Secondary | ICD-10-CM

## 2019-04-26 DIAGNOSIS — R0602 Shortness of breath: Secondary | ICD-10-CM

## 2019-04-26 MED ORDER — METOPROLOL TARTRATE 100 MG PO TABS: 100.0000 mg | ORAL_TABLET | Freq: Once | ORAL | 0 refills | Status: DC

## 2019-04-26 NOTE — Progress Notes (Signed)
CTA ordered per Dr. Percival Spanish due to abnormal stress test.

## 2019-04-29 ENCOUNTER — Encounter: Payer: Self-pay | Admitting: Internal Medicine

## 2019-05-09 NOTE — Addendum Note (Signed)
Addended by: Sherrie Mustache on: 05/09/2019 11:46 AM   Modules accepted: Orders

## 2019-05-09 NOTE — Addendum Note (Signed)
Addended by: Sherrie Mustache on: 05/09/2019 04:25 PM   Modules accepted: Orders

## 2019-05-24 ENCOUNTER — Other Ambulatory Visit: Payer: Self-pay | Admitting: *Deleted

## 2019-05-24 ENCOUNTER — Other Ambulatory Visit: Payer: Self-pay

## 2019-05-24 DIAGNOSIS — Z01818 Encounter for other preprocedural examination: Secondary | ICD-10-CM

## 2019-05-24 DIAGNOSIS — R0602 Shortness of breath: Secondary | ICD-10-CM | POA: Diagnosis not present

## 2019-05-25 LAB — BASIC METABOLIC PANEL
BUN/Creatinine Ratio: 19 (ref 12–28)
BUN: 22 mg/dL (ref 8–27)
CO2: 21 mmol/L (ref 20–29)
Calcium: 9.4 mg/dL (ref 8.7–10.3)
Chloride: 101 mmol/L (ref 96–106)
Creatinine, Ser: 1.14 mg/dL — ABNORMAL HIGH (ref 0.57–1.00)
GFR calc Af Amer: 57 mL/min/{1.73_m2} — ABNORMAL LOW (ref >59)
GFR calc non Af Amer: 50 mL/min/{1.73_m2} — ABNORMAL LOW (ref >59)
Glucose: 129 mg/dL — ABNORMAL HIGH (ref 65–99)
Potassium: 4.1 mmol/L (ref 3.5–5.2)
Sodium: 140 mmol/L (ref 134–144)

## 2019-05-28 ENCOUNTER — Telehealth (HOSPITAL_COMMUNITY): Payer: Self-pay | Admitting: Emergency Medicine

## 2019-05-28 ENCOUNTER — Encounter (HOSPITAL_COMMUNITY): Payer: Self-pay

## 2019-05-28 NOTE — Telephone Encounter (Signed)
Reaching out to patient to offer assistance regarding upcoming cardiac imaging study; pt verbalizes understanding of appt date/time, parking situation and where to check in, pre-test NPO status and medications ordered, and verified current allergies; name and call back number provided for further questions should they arise Fergie Sherbert RN Navigator Cardiac Imaging [place] [facility] [phone] office [phone] cell 

## 2019-05-29 ENCOUNTER — Encounter (HOSPITAL_COMMUNITY): Payer: Self-pay

## 2019-05-29 ENCOUNTER — Other Ambulatory Visit: Payer: Self-pay

## 2019-05-29 ENCOUNTER — Ambulatory Visit (HOSPITAL_COMMUNITY)
Admission: RE | Admit: 2019-05-29 | Discharge: 2019-05-29 | Disposition: A | Discharge status: Home / Self Care | Payer: Medicare Other | Source: Ambulatory Visit | Attending: Cardiology | Admitting: Cardiology

## 2019-05-29 DIAGNOSIS — R9430 Abnormal result of cardiovascular function study, unspecified: Secondary | ICD-10-CM

## 2019-05-29 NOTE — Progress Notes (Signed)
Pt arrived for CT heart scan but the scanner is down at this time. I advised that someone will be in touch to reschedule her appt. Pt was appreciative of this information. She did take her metoprolol this morning so she may need another prescription.

## 2019-06-03 DIAGNOSIS — G5762 Lesion of plantar nerve, left lower limb: Secondary | ICD-10-CM | POA: Diagnosis not present

## 2019-06-03 DIAGNOSIS — M79672 Pain in left foot: Secondary | ICD-10-CM | POA: Diagnosis not present

## 2019-06-26 ENCOUNTER — Telehealth (HOSPITAL_COMMUNITY): Payer: Self-pay | Admitting: Emergency Medicine

## 2019-06-26 MED ORDER — METOPROLOL TARTRATE 100 MG PO TABS: 100.0000 mg | ORAL_TABLET | Freq: Once | ORAL | 0 refills | Status: DC

## 2019-06-26 NOTE — Telephone Encounter (Signed)
Reaching out to patient to offer assistance regarding upcoming cardiac imaging study; pt verbalizes understanding of appt date/time, parking situation and where to check in, pre-test NPO status and medications ordered, and verified current allergies; name and call back number provided for further questions should they arise Marchia Bond RN Navigator Cardiac Imaging [place] [facility] [phone] office [phone] cell  Resubmitting metoprolol Rx to her pharm since she took last dose for prior attempt when scanner was down and needed r/s  Pt instructed to hold furosemide and losartan since last BP was low.  Clarise Cruz

## 2019-06-28 ENCOUNTER — Ambulatory Visit (HOSPITAL_COMMUNITY)
Admission: RE | Admit: 2019-06-28 | Discharge: 2019-06-28 | Disposition: A | Discharge status: Home / Self Care | Payer: Medicare Other | Source: Ambulatory Visit | Attending: Cardiology | Admitting: Cardiology

## 2019-06-28 ENCOUNTER — Other Ambulatory Visit: Payer: Self-pay

## 2019-06-28 DIAGNOSIS — R0602 Shortness of breath: Secondary | ICD-10-CM

## 2019-06-28 DIAGNOSIS — R9430 Abnormal result of cardiovascular function study, unspecified: Secondary | ICD-10-CM | POA: Diagnosis not present

## 2019-06-28 DIAGNOSIS — I700 Atherosclerosis of aorta: Secondary | ICD-10-CM | POA: Insufficient documentation

## 2019-06-28 DIAGNOSIS — I2510 Atherosclerotic heart disease of native coronary artery without angina pectoris: Secondary | ICD-10-CM | POA: Diagnosis not present

## 2019-06-28 IMAGING — CT CT HEART MORP W/ CTA COR W/ SCORE W/ CA W/CM &/OR W/O CM
1 of 14 series · 5 of 20 positions shown, 7 images · IV contrast (APPLIED)
Comparison: None.
COMPARISON: None.

Addendum:
EXAM:
OVER-READ INTERPRETATION  CT CHEST

The following report is an over-read performed by radiologist Dr.
WALDO [REDACTED] on [DATE]. This
over-read does not include interpretation of cardiac or coronary
anatomy or pathology. The coronary calcium score/coronary CTA
interpretation by the cardiologist is attached.
CLINICAL DATA: Chest pain
Cardiac CTA
MEDICATIONS:
Sub lingual nitro. 4mg x 2
TECHNIQUE: The patient was scanned on a Siemens [REDACTED]ice scanner. Gantry
rotation speed was 250 msecs. Collimation was 0.6 mm. A 100 kV
prospective scan was triggered in the ascending thoracic aorta at
35-75% of the R-R interval. Average HR during the scan was 60 bpm.
The 3D data set was interpreted on a dedicated work station using
MPR, MIP and VRT modes. A total of 80cc of contrast was used.

[Series 18: (id) · axial · 0.35mm/px · z∈[+1200,+1282]mm · 5 of 7392 slices shown, 7 images]
[im 1232/7392  vessel]
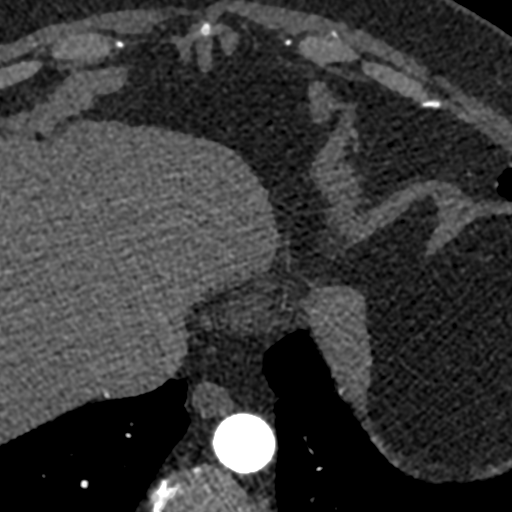
[im 1232/7392  lung]
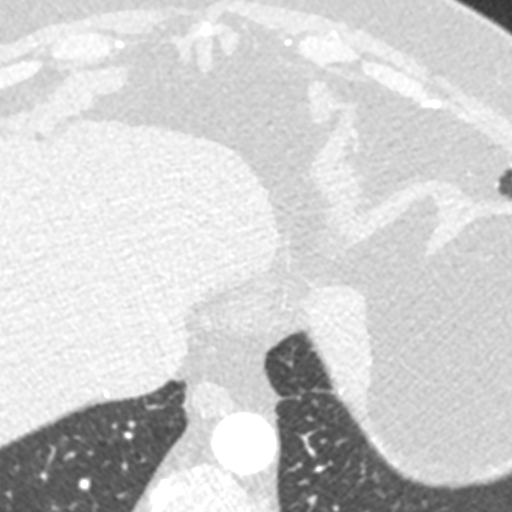
[im 2464/7392  vessel]
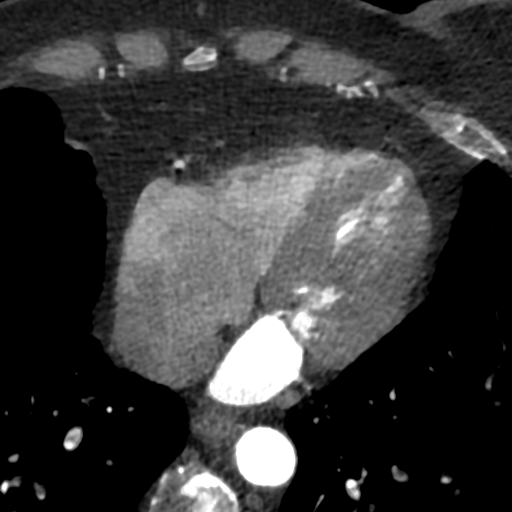
[im 3696/7392  vessel]
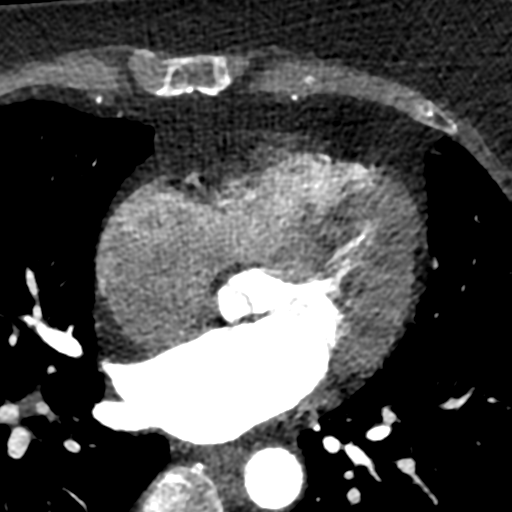
[im 4928/7392  vessel]
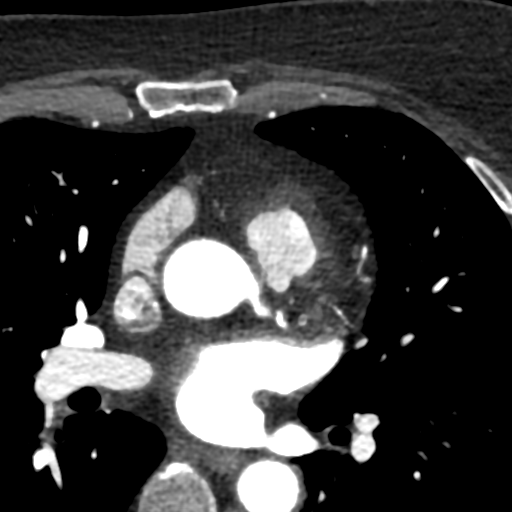
[im 6160/7392  vessel]
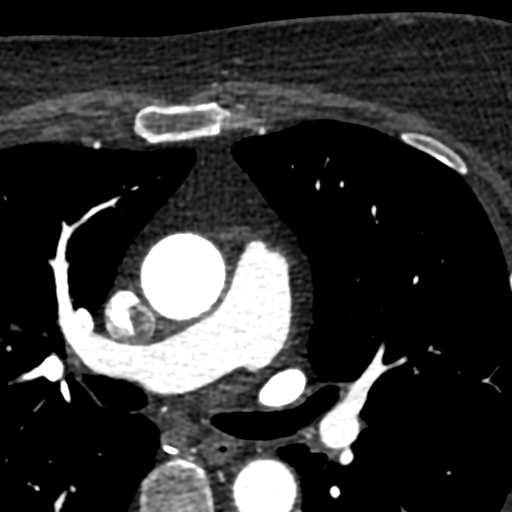
[im 6160/7392  lung]
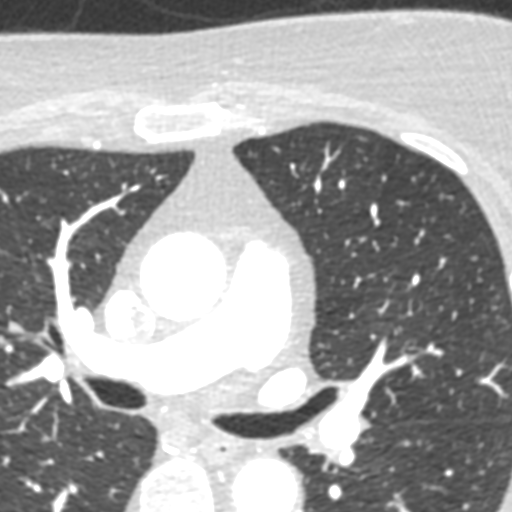

[5 of 20 positions shown; findings below may reference images not displayed]

FINDINGS: Aortic atherosclerosis. Within the visualized portions of the thorax
there are no suspicious appearing pulmonary nodules or masses, there
is no acute consolidative airspace disease, no pleural effusions, no
pneumothorax and no lymphadenopathy. Visualized portions of the
upper abdomen are unremarkable. There are no aggressive appearing
lytic or blastic lesions noted in the visualized portions of the
skeleton.
IMPRESSION: 1.  Aortic Atherosclerosis ([PB]-[PB]).
FINDINGS: Non-cardiac: See separate report from [REDACTED].

Pulmonary veins drain normally to the left atrium.

Calcium Score: 89.8 Agatston units.

Coronary Arteries: Right dominant with no anomalies

LM: Calcified plaque distal left main, no significant stenosis.

LAD system: Difficult images due to artifact. There is extensive
mixed plaque in the proximal and mid LAD, concern for severe
(70-90%) stenosis.

Circumflex system: Difficult images due to artifact. There does not
appear to be obstructive disease in the LCx.

RCA system: 50% stenosis in the mid RCA and 50% stenosis ostial PDA
(versus artifact).
IMPRESSION: 1. Coronary artery calcium score 89.8 Agatston units. This places
the patient in the 75th percentile for age and gender, suggesting
intermediate risk for future cardiac events.

2. Very difficult study due to motion artifact. I am concerned for
possible severe proximal LAD stenosis, but hard to be definitive
with low quality study. I will send for FFR, but not sure that the
study will be adequate for FFR analysis. If not, would consider
assessing by Cardiolite.

WALDO

*** End of Addendum ***
EXAM:
OVER-READ INTERPRETATION  CT CHEST

The following report is an over-read performed by radiologist Dr.
WALDO [REDACTED] on [DATE]. This
over-read does not include interpretation of cardiac or coronary
anatomy or pathology. The coronary calcium score/coronary CTA
interpretation by the cardiologist is attached.
FINDINGS: Aortic atherosclerosis. Within the visualized portions of the thorax
there are no suspicious appearing pulmonary nodules or masses, there
is no acute consolidative airspace disease, no pleural effusions, no
pneumothorax and no lymphadenopathy. Visualized portions of the
upper abdomen are unremarkable. There are no aggressive appearing
lytic or blastic lesions noted in the visualized portions of the
skeleton.
IMPRESSION: 1.  Aortic Atherosclerosis ([PB]-[PB]).

## 2019-06-28 MED ORDER — NITROGLYCERIN 0.4 MG SL SUBL
0.8000 mg | SUBLINGUAL_TABLET | Freq: Once | SUBLINGUAL | Status: AC
  Administered 2019-06-28: 0.8 mg via SUBLINGUAL

## 2019-06-28 MED ORDER — SODIUM CHLORIDE 0.9 % IV BOLUS
500.0000 mL | Freq: Once | INTRAVENOUS | Status: AC
  Administered 2019-06-28: 500 mL via INTRAVENOUS

## 2019-06-28 MED ORDER — NITROGLYCERIN 0.4 MG SL SUBL
SUBLINGUAL_TABLET | SUBLINGUAL | Status: AC
  Filled 2019-06-28: qty 2

## 2019-06-28 MED ORDER — IOHEXOL 350 MG/ML SOLN
80.0000 mL | Freq: Once | INTRAVENOUS | Status: AC | PRN
  Administered 2019-06-28: 80 mL via INTRAVENOUS

## 2019-06-28 NOTE — Progress Notes (Signed)
Patient states " she feels fine to be discharged home at this time after fluid bolus and PO fluids

## 2019-07-01 ENCOUNTER — Other Ambulatory Visit: Payer: Self-pay | Admitting: Internal Medicine

## 2019-07-01 DIAGNOSIS — I10 Essential (primary) hypertension: Secondary | ICD-10-CM

## 2019-07-04 ENCOUNTER — Telehealth: Payer: Self-pay | Admitting: Cardiology

## 2019-07-04 NOTE — Telephone Encounter (Addendum)
Spoke with pt re test results and appears test was not definitive  Also pt noted night before last a pounding in chest and chest bone hurting awakened pt at 5 am that morning.Will forward to Dr Percival Spanish for review and to call pt back./cy

## 2019-07-04 NOTE — Telephone Encounter (Signed)
   Pt returning call from Der. Hochrein about her CT results

## 2019-07-05 ENCOUNTER — Other Ambulatory Visit: Payer: Self-pay

## 2019-07-05 DIAGNOSIS — Z01818 Encounter for other preprocedural examination: Secondary | ICD-10-CM

## 2019-07-05 MED ORDER — ASPIRIN EC 81 MG PO TBEC
81.0000 mg | DELAYED_RELEASE_TABLET | Freq: Every day | ORAL | 3 refills | Status: AC
Start: 2019-07-05 — End: ?

## 2019-07-05 MED ORDER — METOPROLOL SUCCINATE ER 25 MG PO TB24
25.0000 mg | ORAL_TABLET | Freq: Every day | ORAL | 3 refills | Status: DC
Start: 2019-07-05 — End: 2019-09-20

## 2019-07-05 MED ORDER — ATORVASTATIN CALCIUM 40 MG PO TABS
40.0000 mg | ORAL_TABLET | Freq: Every day | ORAL | 3 refills | Status: DC
Start: 2019-07-05 — End: 2019-07-25

## 2019-07-05 MED ORDER — NITROGLYCERIN 0.4 MG SL SUBL
0.4000 mg | SUBLINGUAL_TABLET | SUBLINGUAL | 3 refills | Status: DC | PRN
Start: 2019-07-05 — End: 2022-04-28

## 2019-07-05 NOTE — Progress Notes (Signed)
Spoke with patient. Per Dr. Rosezella [place] result note "She needs to start taking an aspirin 81 mg daily. Would like to call in a prescription for metoprolol 25 mg daily. With her LDL of 111 and now this evidence of coronary disease she needs Lipitor 40 mg daily. I would also suggest sublingual nitroglycerin."  Patient scheduled for heart cath on 5/27. Patient notified of instructions and pre-procedure testing. Patient verbalized understanding.

## 2019-07-08 ENCOUNTER — Other Ambulatory Visit (HOSPITAL_COMMUNITY)
Admission: RE | Admit: 2019-07-08 | Discharge: 2019-07-08 | Disposition: A | Discharge status: Home / Self Care | Payer: Medicare Other | Source: Ambulatory Visit | Attending: Interventional Cardiology | Admitting: Interventional Cardiology

## 2019-07-08 ENCOUNTER — Other Ambulatory Visit: Payer: Self-pay | Admitting: *Deleted

## 2019-07-08 DIAGNOSIS — Z01812 Encounter for preprocedural laboratory examination: Secondary | ICD-10-CM | POA: Insufficient documentation

## 2019-07-08 DIAGNOSIS — Z01818 Encounter for other preprocedural examination: Secondary | ICD-10-CM

## 2019-07-08 DIAGNOSIS — Z20822 Contact with and (suspected) exposure to covid-19: Secondary | ICD-10-CM | POA: Diagnosis not present

## 2019-07-08 LAB — SARS CORONAVIRUS 2 (TAT 6-24 HRS): SARS Coronavirus 2: NEGATIVE

## 2019-07-09 ENCOUNTER — Telehealth: Payer: Self-pay

## 2019-07-09 LAB — CBC
Hematocrit: 39.4 % (ref 34.0–46.6)
Hemoglobin: 13 g/dL (ref 11.1–15.9)
MCH: 27.3 pg (ref 26.6–33.0)
MCHC: 33 g/dL (ref 31.5–35.7)
MCV: 83 fL (ref 79–97)
Platelets: 311 10*3/uL (ref 150–450)
RBC: 4.77 x10E6/uL (ref 3.77–5.28)
RDW: 13.4 % (ref 11.7–15.4)
WBC: 7.3 10*3/uL (ref 3.4–10.8)

## 2019-07-09 LAB — BASIC METABOLIC PANEL
BUN/Creatinine Ratio: 18 (ref 12–28)
BUN: 21 mg/dL (ref 8–27)
CO2: 23 mmol/L (ref 20–29)
Calcium: 9.2 mg/dL (ref 8.7–10.3)
Chloride: 102 mmol/L (ref 96–106)
Creatinine, Ser: 1.17 mg/dL — ABNORMAL HIGH (ref 0.57–1.00)
GFR calc Af Amer: 55 mL/min/{1.73_m2} — ABNORMAL LOW (ref >59)
GFR calc non Af Amer: 48 mL/min/{1.73_m2} — ABNORMAL LOW (ref >59)
Glucose: 83 mg/dL (ref 65–99)
Potassium: 4.6 mmol/L (ref 3.5–5.2)
Sodium: 141 mmol/L (ref 134–144)

## 2019-07-09 NOTE — Telephone Encounter (Signed)
Left voicemail for patient. Patient placed on schedule with Jory Sims for 5/26 virtually. Patient must have a visit, either in office or virtually in order to have catheterization on Thursday 5/27.

## 2019-07-09 NOTE — Telephone Encounter (Signed)
Spoke with patient. Patient aware of mychart appointment tomorrow 5/26.

## 2019-07-10 ENCOUNTER — Encounter: Payer: Self-pay | Admitting: Adult Health

## 2019-07-10 ENCOUNTER — Telehealth (INDEPENDENT_AMBULATORY_CARE_PROVIDER_SITE_OTHER): Payer: Medicare Other | Admitting: Adult Health

## 2019-07-10 VITALS — BP 105/60 | HR 76 | Resp 17 | Ht 65.0 in | Wt 199.0 lb

## 2019-07-10 DIAGNOSIS — I2510 Atherosclerotic heart disease of native coronary artery without angina pectoris: Secondary | ICD-10-CM | POA: Diagnosis not present

## 2019-07-10 DIAGNOSIS — R9430 Abnormal result of cardiovascular function study, unspecified: Secondary | ICD-10-CM | POA: Diagnosis not present

## 2019-07-10 DIAGNOSIS — R079 Chest pain, unspecified: Secondary | ICD-10-CM

## 2019-07-10 DIAGNOSIS — R0602 Shortness of breath: Secondary | ICD-10-CM

## 2019-07-10 DIAGNOSIS — R931 Abnormal findings on diagnostic imaging of heart and coronary circulation: Secondary | ICD-10-CM

## 2019-07-10 DIAGNOSIS — I10 Essential (primary) hypertension: Secondary | ICD-10-CM

## 2019-07-10 NOTE — H&P (View-Only) (Signed)
Virtual Visit via Telephone Note   This visit type was conducted due to national recommendations for restrictions regarding the COVID-19 Pandemic (e.g. social distancing) in an effort to limit this patient's exposure and mitigate transmission in our community.  Due to her co-morbid illnesses, this patient is at least at moderate risk for complications without adequate follow up.  This format is felt to be most appropriate for this patient at this time.  The patient did not have access to video technology/had technical difficulties with video requiring transitioning to audio format only (telephone).  All issues noted in this document were discussed and addressed.  No physical exam could be performed with this format.  Please refer to the patient's chart for her  consent to telehealth for [facility].   Date:  07/10/2019   ID:  Kelsey Church, DOB [birth year], MRN [id number]  Patient Location: Home Provider Location: Home  PCP:  Janith Lima, MD  Cardiologist:  Minus Breeding, MD  Electrophysiologist:  None   Evaluation Performed:  Follow-Up Visit  Chief Complaint: Chest pain.  History of Present Illness:    Kelsey Church is a 69 y.o. female with history of palpitations, strong family history of coronary artery disease, history of COVID in July 2020.  She lost her taste and smell at that time.  She continues to feel uncomfortable in her chest since September 2020, feeling as if "I need to stretch" with palpitations and fast heart rates sometimes at night.  Cardiac monitor ordered by Dr. Ronnald Ramp, her PCP, revealed PACs and PVCs.  She is not very active but she does walk her dog.  She did not have any evidence of PND or orthopnea.  She was seen by Dr. Percival Spanish on 03/15/2019, at which time he felt her pain was atypical but with strong family history he ordered a GXT.  This was completed on 04/17/2019.  Exercise 6.4 METS but test stopped due to dyspnea.  She did have horizontal 1 to 2 mm ST  segment depression noted during stress test in II,III, aVF V4 V5 and V6 leads, with 1 mm ST elevation in aVR and less than 1 mm ST elevation in aVL and V1.  Findings were concerning for ischemia.  She therefore was scheduled for a cardiac CTA.  This was inconclusive as there was too much artifact.  She is therefore scheduled for a left heart catheterization on 06/11/2019.  She is here to discuss cardiac catheterization.    The patient does not have symptoms concerning for COVID-19 infection (fever, chills, cough, or new shortness of breath).    Past Medical History:  Diagnosis Date  . Arthritis    PAIN AND OA LEFT KNEE; S/P RIGHT TOTAL KNEE ARTHROPLASTY 06/10/13  . Cancer ([place])    melanoma in 2 sights  . Complication of anesthesia    "chills every evening at sundown x 2 weeks"- no fever  . Depression   . Dysphagia   . History of shingles    NO RESIDUAL PROBLEMS  . Hypertension   . Pain    LOWER BACK PAIN  . Sleep apnea    CPAP   Past Surgical History:  Procedure Laterality Date  . APPENDECTOMY  1958  . COLONOSCOPY    . COLONOSCOPY W/ POLYPECTOMY     7 polyps  . FOOT SURGERY Right    removal plantar wart  . GALLBLADDER SURGERY  02/14/1990  . INJECTION KNEE  09/2011   right   . KNEE ARTHROSCOPY  [place]   right chin, back  . POLYPECTOMY    . SALPINGOOPHORECTOMY  2009   Right  . TOTAL KNEE ARTHROPLASTY Right 06/10/2013   Procedure: RIGHT TOTAL KNEE ARTHROPLASTY;  Surgeon: [name], MD;  Location: [facility];  Service: Orthopedics;  Laterality: Right;  . TOTAL KNEE ARTHROPLASTY Left 12/16/2013   Procedure: LEFT TOTAL KNEE ARTHROPLASTY;  Surgeon: [name], MD;  Location: [facility];  Service: Orthopedics;  Laterality: Left;     No outpatient medications have been marked as taking for the 07/10/19 encounter (Video Visit) with Lendon Colonel, NP.     Allergies:   Patient has no known allergies.   Social History   Tobacco Use  . Smoking  status: Never Smoker  . Smokeless tobacco: Never Used  Substance Use Topics  . Alcohol use: No    Alcohol/week: 0.0 standard drinks  . Drug use: No     Family Hx: The patient's family history includes Breast cancer in her maternal grandmother and mother; Cancer in her maternal grandmother; Colon polyps in her sister; Diabetes in her sister; Heart attack (age of onset: 6) in her father; Heart disease in her father; Hypertension in her father and sister; Osteoporosis in her mother; Thyroid cancer in her maternal grandmother. There is no history of Colon cancer, Rectal cancer, Stomach cancer, Esophageal cancer, Allergic rhinitis, Asthma, Eczema, or Urticaria.  ROS:   Please see the history of present illness.   All other systems reviewed and are negative.   Prior CV studies:   The following studies were reviewed today: GXT: 04/17/2019   Exercised 6.4 METS, test stopped for dyspnea  Horizontal 1-2 mm ST segment depression was noted during stress in the II, III, aVF, V4, V5 and V6 leads. In addition, there was 29mm ST elevation in aVR and <5mm ST elevation in aVL and V1 (baseline Q waves in aVL, V1).  Findings concerning for ischemia   Cardiac CTA 06/28/2019 FINDINGS: There was too much artifact present to interpret this study by CT FFR. It was returned.  Loralie Champagne  Echocardiogram 03/28/2019 1. Definity used; normal LV systolic function; trace AI.  2. Left ventricular ejection fraction, by estimation, is 55 to 60%. The  left ventricle has normal function. The left ventrical has no regional  wall motion abnormalities. Left ventricular diastolic parameters were  normal.  3. Right ventricular systolic function is normal. The right ventricular  size is normal. There is normal pulmonary artery systolic pressure.  4. The mitral valve is normal in structure and function. trivial mitral  valve regurgitation. No evidence of mitral stenosis.  5. The aortic valve is tricuspid.  Aortic valve regurgitation is trivial .  Mild aortic valve sclerosis is present, with no evidence of aortic valve  stenosis.  6. The inferior vena cava is normal in size with greater than 50%  respiratory variability, suggesting right atrial pressure of 3 mmHg.   Labs/Other Tests and Data Reviewed:      Recent Labs: 12/26/2018: ALT 22; TSH 1.17 07/08/2019: BUN 21; Creatinine, Ser 1.17; Hemoglobin 13.0; Platelets 311; Potassium 4.6; Sodium 141   Recent Lipid Panel Lab Results  Component Value Date/Time   CHOL 195 12/26/2018 09:47 AM   TRIG 196.0 (H) 12/26/2018 09:47 AM   HDL 45.10 12/26/2018 09:47 AM   CHOLHDL 4 12/26/2018 09:47 AM   LDLCALC 111 (H) 12/26/2018 09:47 AM    Wt Readings from Last 3 Encounters:  03/15/19 199 lb (  90.3 kg)  02/12/19 202 lb (91.6 kg)  12/26/18 211 lb (95.7 kg)     Objective:    Vital Signs:  BP 136/72   Pulse 76      ASSESSMENT & PLAN:    1.Chest pain.:  Abnormal GXT, equivocal cardiac CTA with a lot of artifact, uninterpretable.  Plan for left heart catheterization with possible intervention with Dr. Daneen Schick on Jul 11, 2019 at 7:30 AM.she is holding losartan in a.m.  .The patient understands that risks include but are not limited to stroke (1 in 1000), death (1 in 56), kidney failure [usually temporary] (1 in 500), bleeding (1 in 200), allergic reaction [possibly serious] (1 in 200), and agrees to proceed.   2.  OSA: On CPAP.  3.  Hypertension: Well-controlled to soft.  No changes in regimen.   COVID-19 Education: The signs and symptoms of COVID-19 were discussed with the patient and how to seek care for testing (follow up with PCP or arrange E-visit).  The importance of social distancing was discussed today.  Time:   Today, I have spent 45 minutes minutes with the patient with telehealth technology discussing the above problems.     Medication Adjustments/Labs and Tests Ordered: Current medicines are reviewed at length with the  patient today.  Concerns regarding medicines are outlined above.   Tests Ordered: No orders of the defined types were placed in this encounter.   Medication Changes: No orders of the defined types were placed in this encounter.   Disposition:  Follow up post cath   SignedPhill Myron. West Pugh, ANP, AACC  07/10/2019 4:36 PM    [facility]

## 2019-07-10 NOTE — Progress Notes (Signed)
Virtual Visit via Telephone Note   This visit type was conducted due to national recommendations for restrictions regarding the COVID-19 Pandemic (e.g. social distancing) in an effort to limit this patient's exposure and mitigate transmission in our community.  Due to her co-morbid illnesses, this patient is at least at moderate risk for complications without adequate follow up.  This format is felt to be most appropriate for this patient at this time.  The patient did not have access to video technology/had technical difficulties with video requiring transitioning to audio format only (telephone).  All issues noted in this document were discussed and addressed.  No physical exam could be performed with this format.  Please refer to the patient's chart for her  consent to telehealth for [facility].   Date:  07/10/2019   ID:  Kelsey Church, DOB 1950/03/31, MRN [id number]  Patient Location: Home Provider Location: Home  PCP:  Janith Lima, MD  Cardiologist:  Minus Breeding, MD  Electrophysiologist:  None   Evaluation Performed:  Follow-Up Visit  Chief Complaint: Chest pain.  History of Present Illness:    Kelsey Church is a 69 y.o. female with history of palpitations, strong family history of coronary artery disease, history of COVID in July 2020.  She lost her taste and smell at that time.  She continues to feel uncomfortable in her chest since September 2020, feeling as if "I need to stretch" with palpitations and fast heart rates sometimes at night.  Cardiac monitor ordered by Dr. Ronnald Ramp, her PCP, revealed PACs and PVCs.  She is not very active but she does walk her dog.  She did not have any evidence of PND or orthopnea.  She was seen by Dr. Percival Spanish on 03/15/2019, at which time he felt her pain was atypical but with strong family history he ordered a GXT.  This was completed on 04/17/2019.  Exercise 6.4 METS but test stopped due to dyspnea.  She did have horizontal 1 to 2 mm ST  segment depression noted during stress test in II,III, aVF V4 V5 and V6 leads, with 1 mm ST elevation in aVR and less than 1 mm ST elevation in aVL and V1.  Findings were concerning for ischemia.  She therefore was scheduled for a cardiac CTA.  This was inconclusive as there was too much artifact.  She is therefore scheduled for a left heart catheterization on 06/11/2019.  She is here to discuss cardiac catheterization.    The patient does not have symptoms concerning for COVID-19 infection (fever, chills, cough, or new shortness of breath).    Past Medical History:  Diagnosis Date  . Arthritis    PAIN AND OA LEFT KNEE; S/P RIGHT TOTAL KNEE ARTHROPLASTY 06/10/13  . Cancer ([place])    melanoma in 2 sights  . Complication of anesthesia    "chills every evening at sundown x 2 weeks"- no fever  . Depression   . Dysphagia   . History of shingles    NO RESIDUAL PROBLEMS  . Hypertension   . Pain    LOWER BACK PAIN  . Sleep apnea    CPAP   Past Surgical History:  Procedure Laterality Date  . APPENDECTOMY  1958  . COLONOSCOPY    . COLONOSCOPY W/ POLYPECTOMY     7 polyps  . FOOT SURGERY Right    removal plantar wart  . GALLBLADDER SURGERY  02/14/1990  . INJECTION KNEE  09/2011   right   . KNEE ARTHROSCOPY  [place]   right chin, back  . POLYPECTOMY    . SALPINGOOPHORECTOMY  2009   Right  . TOTAL KNEE ARTHROPLASTY Right 06/10/2013   Procedure: RIGHT TOTAL KNEE ARTHROPLASTY;  Surgeon: [name], MD;  Location: [facility];  Service: Orthopedics;  Laterality: Right;  . TOTAL KNEE ARTHROPLASTY Left 12/16/2013   Procedure: LEFT TOTAL KNEE ARTHROPLASTY;  Surgeon: [name], MD;  Location: [facility];  Service: Orthopedics;  Laterality: Left;     No outpatient medications have been marked as taking for the 07/10/19 encounter (Video Visit) with Lendon Colonel, NP.     Allergies:   Patient has no known allergies.   Social History   Tobacco Use  . Smoking  status: Never Smoker  . Smokeless tobacco: Never Used  Substance Use Topics  . Alcohol use: No    Alcohol/week: 0.0 standard drinks  . Drug use: No     Family Hx: The patient's family history includes Breast cancer in her maternal grandmother and mother; Cancer in her maternal grandmother; Colon polyps in her sister; Diabetes in her sister; Heart attack (age of onset: 32) in her father; Heart disease in her father; Hypertension in her father and sister; Osteoporosis in her mother; Thyroid cancer in her maternal grandmother. There is no history of Colon cancer, Rectal cancer, Stomach cancer, Esophageal cancer, Allergic rhinitis, Asthma, Eczema, or Urticaria.  ROS:   Please see the history of present illness.   All other systems reviewed and are negative.   Prior CV studies:   The following studies were reviewed today: GXT: 04/17/2019   Exercised 6.4 METS, test stopped for dyspnea  Horizontal 1-2 mm ST segment depression was noted during stress in the II, III, aVF, V4, V5 and V6 leads. In addition, there was 49mm ST elevation in aVR and <32mm ST elevation in aVL and V1 (baseline Q waves in aVL, V1).  Findings concerning for ischemia   Cardiac CTA 06/28/2019 FINDINGS: There was too much artifact present to interpret this study by CT FFR. It was returned.  Loralie Champagne  Echocardiogram 03/28/2019 1. Definity used; normal LV systolic function; trace AI.  2. Left ventricular ejection fraction, by estimation, is 55 to 60%. The  left ventricle has normal function. The left ventrical has no regional  wall motion abnormalities. Left ventricular diastolic parameters were  normal.  3. Right ventricular systolic function is normal. The right ventricular  size is normal. There is normal pulmonary artery systolic pressure.  4. The mitral valve is normal in structure and function. trivial mitral  valve regurgitation. No evidence of mitral stenosis.  5. The aortic valve is tricuspid.  Aortic valve regurgitation is trivial .  Mild aortic valve sclerosis is present, with no evidence of aortic valve  stenosis.  6. The inferior vena cava is normal in size with greater than 50%  respiratory variability, suggesting right atrial pressure of 3 mmHg.   Labs/Other Tests and Data Reviewed:      Recent Labs: 12/26/2018: ALT 22; TSH 1.17 07/08/2019: BUN 21; Creatinine, Ser 1.17; Hemoglobin 13.0; Platelets 311; Potassium 4.6; Sodium 141   Recent Lipid Panel Lab Results  Component Value Date/Time   CHOL 195 12/26/2018 09:47 AM   TRIG 196.0 (H) 12/26/2018 09:47 AM   HDL 45.10 12/26/2018 09:47 AM   CHOLHDL 4 12/26/2018 09:47 AM   LDLCALC 111 (H) 12/26/2018 09:47 AM    Wt Readings from Last 3 Encounters:  03/15/19 199 lb (  90.3 kg)  02/12/19 202 lb (91.6 kg)  12/26/18 211 lb (95.7 kg)     Objective:    Vital Signs:  BP 136/72   Pulse 76      ASSESSMENT & PLAN:    1.Chest pain.:  Abnormal GXT, equivocal cardiac CTA with a lot of artifact, uninterpretable.  Plan for left heart catheterization with possible intervention with Dr. Daneen Schick on Jul 11, 2019 at 7:30 AM.she is holding losartan in a.m.  .The patient understands that risks include but are not limited to stroke (1 in 1000), death (1 in 48), kidney failure [usually temporary] (1 in 500), bleeding (1 in 200), allergic reaction [possibly serious] (1 in 200), and agrees to proceed.   2.  OSA: On CPAP.  3.  Hypertension: Well-controlled to soft.  No changes in regimen.   COVID-19 Education: The signs and symptoms of COVID-19 were discussed with the patient and how to seek care for testing (follow up with PCP or arrange E-visit).  The importance of social distancing was discussed today.  Time:   Today, I have spent 45 minutes minutes with the patient with telehealth technology discussing the above problems.     Medication Adjustments/Labs and Tests Ordered: Current medicines are reviewed at length with the  patient today.  Concerns regarding medicines are outlined above.   Tests Ordered: No orders of the defined types were placed in this encounter.   Medication Changes: No orders of the defined types were placed in this encounter.   Disposition:  Follow up post cath   SignedPhill Myron. West Pugh, ANP, AACC  07/10/2019 4:36 PM    [facility]

## 2019-07-11 ENCOUNTER — Encounter (HOSPITAL_COMMUNITY)
Admission: RE | Disposition: A | Discharge status: Home / Self Care | Payer: Medicare Other | Source: Home / Self Care | Attending: Interventional Cardiology

## 2019-07-11 ENCOUNTER — Ambulatory Visit (HOSPITAL_COMMUNITY)
Admission: RE | Admit: 2019-07-11 | Discharge: 2019-07-11 | Disposition: A | Discharge status: Home / Self Care | Payer: Medicare Other | Attending: Interventional Cardiology | Admitting: Interventional Cardiology

## 2019-07-11 ENCOUNTER — Other Ambulatory Visit: Payer: Self-pay

## 2019-07-11 DIAGNOSIS — G4733 Obstructive sleep apnea (adult) (pediatric): Secondary | ICD-10-CM | POA: Diagnosis not present

## 2019-07-11 DIAGNOSIS — Z833 Family history of diabetes mellitus: Secondary | ICD-10-CM | POA: Insufficient documentation

## 2019-07-11 DIAGNOSIS — I2582 Chronic total occlusion of coronary artery: Secondary | ICD-10-CM | POA: Insufficient documentation

## 2019-07-11 DIAGNOSIS — Z8262 Family history of osteoporosis: Secondary | ICD-10-CM | POA: Insufficient documentation

## 2019-07-11 DIAGNOSIS — I2510 Atherosclerotic heart disease of native coronary artery without angina pectoris: Secondary | ICD-10-CM

## 2019-07-11 DIAGNOSIS — R7303 Prediabetes: Secondary | ICD-10-CM | POA: Diagnosis not present

## 2019-07-11 DIAGNOSIS — I25118 Atherosclerotic heart disease of native coronary artery with other forms of angina pectoris: Secondary | ICD-10-CM

## 2019-07-11 DIAGNOSIS — Z96653 Presence of artificial knee joint, bilateral: Secondary | ICD-10-CM | POA: Insufficient documentation

## 2019-07-11 DIAGNOSIS — M1990 Unspecified osteoarthritis, unspecified site: Secondary | ICD-10-CM | POA: Diagnosis not present

## 2019-07-11 DIAGNOSIS — Z8249 Family history of ischemic heart disease and other diseases of the circulatory system: Secondary | ICD-10-CM | POA: Insufficient documentation

## 2019-07-11 DIAGNOSIS — E7800 Pure hypercholesterolemia, unspecified: Secondary | ICD-10-CM | POA: Diagnosis present

## 2019-07-11 DIAGNOSIS — I10 Essential (primary) hypertension: Secondary | ICD-10-CM | POA: Diagnosis not present

## 2019-07-11 DIAGNOSIS — N1831 Chronic kidney disease, stage 3a: Secondary | ICD-10-CM | POA: Diagnosis present

## 2019-07-11 HISTORY — PX: LEFT HEART CATH AND CORONARY ANGIOGRAPHY: CATH118249

## 2019-07-11 SURGERY — LEFT HEART CATH AND CORONARY ANGIOGRAPHY
Anesthesia: LOCAL

## 2019-07-11 MED ORDER — LABETALOL HCL 5 MG/ML IV SOLN: 10.0000 mg | INTRAVENOUS | Status: DC | PRN

## 2019-07-11 MED ORDER — VERAPAMIL HCL 2.5 MG/ML IV SOLN
INTRAVENOUS | Status: DC | PRN
  Administered 2019-07-11: 10 mL via INTRA_ARTERIAL

## 2019-07-11 MED ORDER — FENTANYL CITRATE (PF) 100 MCG/2ML IJ SOLN
INTRAMUSCULAR | Status: DC | PRN
  Administered 2019-07-11: 25 ug via INTRAVENOUS

## 2019-07-11 MED ORDER — HEPARIN (PORCINE) IN NACL 1000-0.9 UT/500ML-% IV SOLN
INTRAVENOUS | Status: AC
  Filled 2019-07-11: qty 1000

## 2019-07-11 MED ORDER — VERAPAMIL HCL 2.5 MG/ML IV SOLN
INTRAVENOUS | Status: AC
  Filled 2019-07-11: qty 2

## 2019-07-11 MED ORDER — LIDOCAINE HCL (PF) 1 % IJ SOLN
INTRAMUSCULAR | Status: AC
  Filled 2019-07-11: qty 30

## 2019-07-11 MED ORDER — SODIUM CHLORIDE 0.9 % WEIGHT BASED INFUSION
1.0000 mL/kg/h | INTRAVENOUS | Status: DC
  Administered 2019-07-11: 250 mL via INTRAVENOUS

## 2019-07-11 MED ORDER — SODIUM CHLORIDE 0.9% FLUSH: 3.0000 mL | INTRAVENOUS | Status: DC | PRN

## 2019-07-11 MED ORDER — ACETAMINOPHEN 325 MG PO TABS: 650.0000 mg | ORAL_TABLET | ORAL | Status: DC | PRN

## 2019-07-11 MED ORDER — HYDRALAZINE HCL 20 MG/ML IJ SOLN: 10.0000 mg | INTRAMUSCULAR | Status: DC | PRN

## 2019-07-11 MED ORDER — LIDOCAINE HCL (PF) 1 % IJ SOLN
INTRAMUSCULAR | Status: DC | PRN
  Administered 2019-07-11: 2 mL

## 2019-07-11 MED ORDER — IOHEXOL 350 MG/ML SOLN
INTRAVENOUS | Status: DC | PRN
  Administered 2019-07-11: 95 mL

## 2019-07-11 MED ORDER — FENTANYL CITRATE (PF) 100 MCG/2ML IJ SOLN
INTRAMUSCULAR | Status: AC
  Filled 2019-07-11: qty 2

## 2019-07-11 MED ORDER — HEPARIN SODIUM (PORCINE) 1000 UNIT/ML IJ SOLN
INTRAMUSCULAR | Status: DC | PRN
  Administered 2019-07-11: 4500 [IU] via INTRAVENOUS

## 2019-07-11 MED ORDER — MIDAZOLAM HCL 2 MG/2ML IJ SOLN
INTRAMUSCULAR | Status: AC
  Filled 2019-07-11: qty 2

## 2019-07-11 MED ORDER — HEPARIN (PORCINE) IN NACL 1000-0.9 UT/500ML-% IV SOLN
INTRAVENOUS | Status: DC | PRN
  Administered 2019-07-11 (×2): 500 mL

## 2019-07-11 MED ORDER — NITROGLYCERIN 1 MG/10 ML FOR IR/CATH LAB
INTRA_ARTERIAL | Status: AC
  Filled 2019-07-11: qty 10

## 2019-07-11 MED ORDER — ONDANSETRON HCL 4 MG/2ML IJ SOLN: 4.0000 mg | Freq: Four times a day (QID) | INTRAMUSCULAR | Status: DC | PRN

## 2019-07-11 MED ORDER — SODIUM CHLORIDE 0.9% FLUSH: 3.0000 mL | Freq: Two times a day (BID) | INTRAVENOUS | Status: DC

## 2019-07-11 MED ORDER — ASPIRIN 81 MG PO CHEW
81.0000 mg | CHEWABLE_TABLET | Freq: Once | ORAL | Status: AC
  Administered 2019-07-11: 81 mg via ORAL
  Filled 2019-07-11: qty 1

## 2019-07-11 MED ORDER — NITROGLYCERIN 1 MG/10 ML FOR IR/CATH LAB
INTRA_ARTERIAL | Status: DC | PRN
  Administered 2019-07-11 (×2): 200 ug via INTRACORONARY

## 2019-07-11 MED ORDER — HEPARIN SODIUM (PORCINE) 1000 UNIT/ML IJ SOLN
INTRAMUSCULAR | Status: AC
  Filled 2019-07-11: qty 1

## 2019-07-11 MED ORDER — MIDAZOLAM HCL 2 MG/2ML IJ SOLN
INTRAMUSCULAR | Status: DC | PRN
  Administered 2019-07-11: 1 mg via INTRAVENOUS

## 2019-07-11 MED ORDER — SODIUM CHLORIDE 0.9 % IV SOLN: 250.0000 mL | INTRAVENOUS | Status: DC | PRN

## 2019-07-11 MED ORDER — SODIUM CHLORIDE 0.9 % WEIGHT BASED INFUSION
3.0000 mL/kg/h | INTRAVENOUS | Status: AC
  Administered 2019-07-11: 3 mL/kg/h via INTRAVENOUS

## 2019-07-11 MED ORDER — SODIUM CHLORIDE 0.9 % IV SOLN: INTRAVENOUS | Status: AC

## 2019-07-11 MED ORDER — ASPIRIN 81 MG PO CHEW: 81.0000 mg | CHEWABLE_TABLET | Freq: Every day | ORAL | Status: DC

## 2019-07-11 SURGICAL SUPPLY — 10 items
CATH 5FR JL3.5 JR4 ANG PIG MP (CATHETERS) ×2 IMPLANT
DEVICE RAD TR BAND REGULAR (VASCULAR PRODUCTS) ×2 IMPLANT
GLIDESHEATH SLEND A-KIT 6F 22G (SHEATH) ×2 IMPLANT
GUIDEWIRE INQWIRE 1.5J.035X260 (WIRE) ×2 IMPLANT
INQWIRE 1.5J .035X260CM (WIRE) ×4
KIT HEART LEFT (KITS) ×2 IMPLANT
PACK CARDIAC CATHETERIZATION (CUSTOM PROCEDURE TRAY) ×2 IMPLANT
SHEATH PROBE COVER 6X72 (BAG) ×2 IMPLANT
TRANSDUCER W/STOPCOCK (MISCELLANEOUS) ×2 IMPLANT
TUBING CIL FLEX 10 FLL-RA (TUBING) ×2 IMPLANT

## 2019-07-11 NOTE — CV Procedure (Signed)
   Left heart cath with coronary angiography via right radial using real-time vascular ultrasound for guidance.  Irregularities in the left main with some ectasia.  No obstructive disease.  Proximal to mid segmental diffuse LAD disease up to 70%, framed by a large first and second diagonal each of which has significant ostial and mid vessel disease.  The LAD wraps around the apex.  RCA contains diffuse mid vessel disease up to 60 to 70%.  Distal RCA into PDA with 80% stenosis.  Diffuse segment in the mid PDA 70 to 75%.  Circumflex has proximal to mid 50% narrowing.  A very proximal first obtuse marginal contains ostial 30% narrowing.  A small second obtuse marginal is 99% obstructive.  The vessel is small and has TIMI grade II flow.  LV gram: Normal function.  EF 70%.  LVEDP 16 mmHg  IMPRESSION: Moderately severe three-vessel coronary disease with diffuse involvement of proximal LAD, diagonals, distal RCA and large PDA.  RECOMMENDATIONS: Aggressive optimal medical therapy to include risk preventive agents and anti-ischemic agents.  Target LDL less than 55, tight blood pressure control less than 130/80 mmHg, evaluation of glycemic control, beta-blocker and long-acting nitrate therapy.  If unacceptable symptoms thereafter, consider multivessel coronary bypass grafting.

## 2019-07-11 NOTE — Interval H&P Note (Signed)
Cath Lab Visit (complete for each Cath Lab visit)  Clinical Evaluation Leading to the Procedure:   ACS: No.  Non-ACS:    Anginal Classification: CCS III  Anti-ischemic medical therapy: Minimal Therapy (1 class of medications)  Non-Invasive Test Results: Intermediate-risk stress test findings: cardiac mortality 1-3%/year  Prior CABG: No previous CABG      History and Physical Interval Note:  07/11/2019 7:18 AM  Kelsey Church  has presented today for surgery, with the diagnosis of abnormal CTA.  The various methods of treatment have been discussed with the patient and family. After consideration of risks, benefits and other options for treatment, the patient has consented to  Procedure(s): LEFT HEART CATH AND CORONARY ANGIOGRAPHY (N/A) as a surgical intervention.  The patient's history has been reviewed, patient examined, no change in status, stable for surgery.  I have reviewed the patient's chart and labs.  Questions were answered to the patient's satisfaction.     Belva Crome III

## 2019-07-11 NOTE — Discharge Instructions (Signed)
Radial Site Care  This sheet gives you information about how to care for yourself after your procedure. Your health care provider may also give you more specific instructions. If you have problems or questions, contact your health care provider. What can I expect after the procedure? After the procedure, it is common to have:  Bruising and tenderness at the catheter insertion area. Follow these instructions at home: Medicines  Take over-the-counter and prescription medicines only as told by your health care provider. Insertion site care  Follow instructions from your health care provider about how to take care of your insertion site. Make sure you: ? Wash your hands with soap and water before you change your bandage (dressing). If soap and water are not available, use hand sanitizer. ? Change your dressing as told by your health care provider. ? Leave stitches (sutures), skin glue, or adhesive strips in place. These skin closures may need to stay in place for 2 weeks or longer. If adhesive strip edges start to loosen and curl up, you may trim the loose edges. Do not remove adhesive strips completely unless your health care provider tells you to do that.  Check your insertion site every day for signs of infection. Check for: ? Redness, swelling, or pain. ? Fluid or blood. ? Pus or a bad smell. ? Warmth.  Do not take baths, swim, or use a hot tub until your health care provider approves.  You may shower 24-48 hours after the procedure, or as directed by your health care provider. ? Remove the dressing and gently wash the site with plain soap and water. ? Pat the area dry with a clean towel. ? Do not rub the site. That could cause bleeding.  Do not apply powder or lotion to the site. Activity   For 24 hours after the procedure, or as directed by your health care provider: ? Do not flex or bend the affected arm. ? Do not push or pull heavy objects with the affected arm. ? Do not  drive yourself home from the hospital or clinic. You may drive 24 hours after the procedure unless your health care provider tells you not to. ? Do not operate machinery or power tools.  Do not lift anything that is heavier than 10 lb (4.5 kg), or the limit that you are told, until your health care provider says that it is safe.  Ask your health care provider when it is okay to: ? Return to work or school. ? Resume usual physical activities or sports. ? Resume sexual activity. General instructions  If the catheter site starts to bleed, raise your arm and put firm pressure on the site. If the bleeding does not stop, get help right away. This is a medical emergency.  If you went home on the same day as your procedure, a responsible adult should be with you for the first 24 hours after you arrive home.  Keep all follow-up visits as told by your health care provider. This is important. Contact a health care provider if:  You have a fever.  You have redness, swelling, or yellow drainage around your insertion site. Get help right away if:  You have unusual pain at the radial site.  The catheter insertion area swells very fast.  The insertion area is bleeding, and the bleeding does not stop when you hold steady pressure on the area.  Your arm or hand becomes pale, cool, tingly, or numb. These symptoms may represent a serious problem   that is an emergency. Do not wait to see if the symptoms will go away. Get medical help right away. Call your local emergency services (911 in the [place] Do not drive yourself to the hospital. Summary  After the procedure, it is common to have bruising and tenderness at the site.  Follow instructions from your health care provider about how to take care of your radial site wound. Check the wound every day for signs of infection.  Do not lift anything that is heavier than 10 lb (4.5 kg), or the limit that you are told, until your health care provider says  that it is safe. This information is not intended to replace advice given to you by your health care provider. Make sure you discuss any questions you have with your health care provider. Document Revised: 03/08/2017 Document Reviewed: 03/08/2017 Elsevier Patient Education  2020 [employer]  

## 2019-07-25 ENCOUNTER — Other Ambulatory Visit: Payer: Self-pay

## 2019-07-25 MED ORDER — ATORVASTATIN CALCIUM 40 MG PO TABS: 40.0000 mg | ORAL_TABLET | Freq: Every day | ORAL | 3 refills | Status: DC

## 2019-07-25 NOTE — Telephone Encounter (Signed)
Pt needs medication sent to [facility] mail order pharmacy. This is Dr. Rosezella [place] pt. Please address

## 2019-08-20 NOTE — Progress Notes (Deleted)
Cardiology Office Note   Date:  08/20/2019   ID:  Kelsey Church, DOB May 08, 1950, MRN [id number]  PCP:  Kelsey Lima, MD  Cardiologist:  Dr.Hochrein  No chief complaint on file.    History of Present Illness: Kelsey Church is a 69 y.o. female who presents for ongoing assessment and management after having abnormal stress test followed by a cardiac CTA that was inconclusive as there was too much artifact.  She was scheduled for a left heart catheterization on 06/11/2019  She was found to have moderately severe three-vessel coronary artery disease with diffuse involvement of the proximal LAD, diagonals, distal RCA, and large PDA.  She was recommended for aggressive medical therapy to include risk prevention agents and anti-ischemic agents.  She needed tighter blood pressure control and lipid management.  If symptoms persisted, she would be considered for multivessel coronary artery bypass grafting.  It was recommended that her blood pressure be less than 130/80 with an LDL less than 55 by Dr. Daneen Schick II, who performed her catheterization.  Other history includes depression, dysphagia, history of shingles, hypertension, and sleep apnea on CPAP.  Past Medical History:  Diagnosis Date  . Arthritis    PAIN AND OA LEFT KNEE; S/P RIGHT TOTAL KNEE ARTHROPLASTY 06/10/13  . Cancer ([place])    melanoma in 2 sights  . Complication of anesthesia    "chills every evening at sundown x 2 weeks"- no fever  . Depression   . Dysphagia   . History of shingles    NO RESIDUAL PROBLEMS  . Hypertension   . Pain    LOWER BACK PAIN  . Sleep apnea    CPAP    Past Surgical History:  Procedure Laterality Date  . APPENDECTOMY  1958  . COLONOSCOPY    . COLONOSCOPY W/ POLYPECTOMY     7 polyps  . FOOT SURGERY Right    removal plantar wart  . GALLBLADDER SURGERY  02/14/1990  . INJECTION KNEE  09/2011   right   . KNEE ARTHROSCOPY  1996  . LEFT HEART CATH AND CORONARY ANGIOGRAPHY N/A 07/11/2019    Procedure: LEFT HEART CATH AND CORONARY ANGIOGRAPHY;  Surgeon: Belva Crome, MD;  Location: [facility];  Service: Cardiovascular;  Laterality: N/A;  . MELANOMA EXCISION  1992   right chin, back  . POLYPECTOMY    . SALPINGOOPHORECTOMY  2009   Right  . TOTAL KNEE ARTHROPLASTY Right 06/10/2013   Procedure: RIGHT TOTAL KNEE ARTHROPLASTY;  Surgeon: [name], MD;  Location: [facility];  Service: Orthopedics;  Laterality: Right;  . TOTAL KNEE ARTHROPLASTY Left 12/16/2013   Procedure: LEFT TOTAL KNEE ARTHROPLASTY;  Surgeon: [name], MD;  Location: [facility];  Service: Orthopedics;  Laterality: Left;     Current Outpatient Medications  Medication Sig Dispense Refill  . acetaminophen (TYLENOL) 650 MG CR tablet Take 1,300 mg by mouth every 8 (eight) hours as needed for pain.     Marland Kitchen aspirin EC 81 MG tablet Take 1 tablet (81 mg total) by mouth daily. 90 tablet 3  . atorvastatin (LIPITOR) 40 MG tablet Take 1 tablet (40 mg total) by mouth daily. 90 tablet 3  . cetirizine (ZYRTEC) 10 MG tablet Take 10 mg by mouth daily as needed for allergies.    . citalopram (CELEXA) 20 MG tablet TAKE 1 TABLET AT BEDTIME (Patient taking differently: Take 20 mg by mouth at bedtime. ) 90 tablet 1  . estradiol (CLIMARA - DOSED IN  MG/24 HR) 0.05 mg/24hr patch Place 1 patch (0.05 mg total) onto the skin once a week. (Patient taking differently: Place 0.05 mg onto the skin every Sunday. ) 12 patch 4  . furosemide (LASIX) 20 MG tablet TAKE 1 TABLET DAILY (Patient taking differently: Take 20 mg by mouth daily. ) 90 tablet 1  . gabapentin (NEURONTIN) 300 MG capsule Take 300 mg by mouth 3 (three) times daily.     Marland Kitchen losartan (COZAAR) 100 MG tablet TAKE 1 TABLET DAILY (Patient taking differently: Take 100 mg by mouth daily. ) 90 tablet 1  . metoprolol succinate (TOPROL XL) 25 MG 24 hr tablet Take 1 tablet (25 mg total) by mouth daily. 90 tablet 3  . metoprolol tartrate (LOPRESSOR) 100 MG tablet Take 1 tablet (100 mg  total) by mouth once for 1 dose. Take 2 hours before your CTA (Patient not taking: Reported on 07/05/2019) 1 tablet 0  . Misc Natural Products (TURMERIC CURCUMIN) CAPS Take 1 capsule by mouth daily.    . nitroGLYCERIN (NITROSTAT) 0.4 MG SL tablet Place 1 tablet (0.4 mg total) under the tongue every 5 (five) minutes as needed for chest pain. 25 tablet 3  . progesterone (PROMETRIUM) 100 MG capsule Take 1 capsule (100 mg total) by mouth at bedtime. 90 capsule 4   No current facility-administered medications for this visit.    Allergies:   Patient has no known allergies.    Social History:  The patient  reports that she has never smoked. She has never used smokeless tobacco. She reports that she does not drink alcohol and does not use drugs.   Family History:  The patient's family history includes Breast cancer in her maternal grandmother and mother; Cancer in her maternal grandmother; Colon polyps in her sister; Diabetes in her sister; Heart attack (age of onset: 51) in her father; Heart disease in her father; Hypertension in her father and sister; Osteoporosis in her mother; Thyroid cancer in her maternal grandmother.    ROS: All other systems are reviewed and negative. Unless otherwise mentioned in H&P    PHYSICAL EXAM: VS:  There were no vitals taken for this visit. , BMI There is no height or weight on file to calculate BMI. GEN: Well nourished, well developed, in no acute distress HEENT: normal Neck: no JVD, carotid bruits, or masses Cardiac: ***RRR; no murmurs, rubs, or gallops,no edema  Respiratory:  Clear to auscultation bilaterally, normal work of breathing GI: soft, nontender, nondistended, + BS MS: no deformity or atrophy Skin: warm and dry, no rash Neuro:  Strength and sensation are intact Psych: euthymic mood, full affect   EKG:  EKG {ACTION; IS/IS [id number]} ordered today. The ekg ordered today demonstrates ***   Recent Labs: 12/26/2018: ALT 22; TSH 1.17 07/08/2019:  BUN 21; Creatinine, Ser 1.17; Hemoglobin 13.0; Platelets 311; Potassium 4.6; Sodium 141    Lipid Panel    Component Value Date/Time   CHOL 195 12/26/2018 0947   TRIG 196.0 (H) 12/26/2018 0947   HDL 45.10 12/26/2018 0947   CHOLHDL 4 12/26/2018 0947   VLDL 39.2 12/26/2018 0947   LDLCALC 111 (H) 12/26/2018 0947      Wt Readings from Last 3 Encounters:  07/11/19 200 lb (90.7 kg)  07/10/19 199 lb (90.3 kg)  03/15/19 199 lb (90.3 kg)      Other studies Reviewed: [facility] 07/11/2019  Diffuse LAD and right coronary disease with moderate to moderately severe obstruction.  Left main is widely patent  LAD contains diffuse  proximal to mid atherosclerosis with up to 70% stenosis.  The most severe segment is framed by the first and second diagonal each of which is moderate moderate to large in size.  Each diagonal contains 70 to 90% ostial to proximal stenoses.  The LAD wraps around the left ventricular apex.  Circumflex contains 40% proximal to mid disease.  A small second obtuse marginal is essentially totally occluded.  Right coronary is dominant.  There is diffuse 60% mid vessel stenosis.  Distal RCA into the ostial PDA contains 80% stenosis.  Diffuse disease is noted throughout the PDA up to 75 to 80% in the midsegment.  Left ventricular function is normal with EF greater than 60%.  LVEDP is 14 mmHg.  RECOMMENDATIONS:   Aggressive preventive therapy including high intensity statin to lower LDL less than 55, antiplatelet therapy, tight blood pressure control less than 130/80 mmHg, and consideration of SGLT2 therapy if hemoglobin A1c greater than 6.5.  Anti-ischemic therapy to include beta-blocker and long-acting nitrates.  If continued symptoms which in her case may be dyspnea as an anginal equivalent, consider referral for bypass grafting.   Intervention   ASSESSMENT AND PLAN:  1.  ***   Current medicines are reviewed at length with the patient today.  I have spent ***  dedicated to the care of this patient on the date of this encounter to include pre-visit review of records, assessment, management and diagnostic testing,with shared decision making.  Labs/ tests ordered today include: *** Phill Myron. West Pugh, ANP, AACC   08/20/2019 4:54 PM    [facility] [place] [address] Office [phone] Fax [phone]  Notice: This dictation was prepared with Dragon dictation along with smaller phrase technology. Any transcriptional errors that result from this process are unintentional and may not be corrected upon review.

## 2019-08-21 ENCOUNTER — Other Ambulatory Visit: Payer: Self-pay | Admitting: Internal Medicine

## 2019-08-21 ENCOUNTER — Ambulatory Visit: Payer: Medicare Other | Admitting: Adult Health

## 2019-08-21 DIAGNOSIS — I10 Essential (primary) hypertension: Secondary | ICD-10-CM

## 2019-08-27 ENCOUNTER — Other Ambulatory Visit: Payer: Self-pay

## 2019-09-02 ENCOUNTER — Other Ambulatory Visit: Payer: Self-pay

## 2019-09-13 ENCOUNTER — Encounter: Payer: Self-pay | Admitting: Internal Medicine

## 2019-09-19 DIAGNOSIS — E785 Hyperlipidemia, unspecified: Secondary | ICD-10-CM | POA: Insufficient documentation

## 2019-09-19 NOTE — Progress Notes (Signed)
Cardiology Office Note   Date:  09/20/2019   ID:  KASHMIR LEEDY, DOB 1950/12/18, MRN [id number]  PCP:  Janith Lima, MD  Cardiologist:   Minus Breeding, MD Referring:  Janith Lima, MD  Chief Complaint  Patient presents with  . Shortness of Breath      History of Present Illness: Kelsey Church is a 69 y.o. female who was referred by Janith Lima, MD for evaluation of CAD.   She had an abnormal POET (Plain Old Exercise Treadmill) and had cath with results below.  She is managed medically for CAD.   She had predominantly small vessel or distal vessel disease.  She had nonobstructive disease in proximal large vessel and mid large vessels.  Since she was seen she has had some continued shortness of breath with activities.  She been under stress that she is getting ready to move to [facility].  She is not describing substernal chest pressure, neck or arm discomfort.  She does have palpitations.  This is different than what she had in December when she had a monitor that demonstrated no significant arrhythmias.  This is happening almost every night.  She feels it when she rolls over.  She thinks her heart rate goes up into the 150s.  It takes a while to go down down.  She started taking her metoprolol at night.  She says her blood pressure and heart rate have actually been low during the day.  She denies any presyncope or syncope.  She had no new PND or orthopnea  Past Medical History:  Diagnosis Date  . Arthritis    PAIN AND OA LEFT KNEE; S/P RIGHT TOTAL KNEE ARTHROPLASTY 06/10/13  . Cancer ([place])    melanoma in 2 sights  . Complication of anesthesia    "chills every evening at sundown x 2 weeks"- no fever  . Depression   . Dysphagia   . History of shingles    NO RESIDUAL PROBLEMS  . Hypertension   . Pain    LOWER BACK PAIN  . Sleep apnea    CPAP    Past Surgical History:  Procedure Laterality Date  . APPENDECTOMY  1958  . COLONOSCOPY    . COLONOSCOPY W/  POLYPECTOMY     7 polyps  . FOOT SURGERY Right    removal plantar wart  . GALLBLADDER SURGERY  02/14/1990  . INJECTION KNEE  09/2011   right   . KNEE ARTHROSCOPY  1996  . LEFT HEART CATH AND CORONARY ANGIOGRAPHY N/A 07/11/2019   Procedure: LEFT HEART CATH AND CORONARY ANGIOGRAPHY;  Surgeon: Belva Crome, MD;  Location: [facility];  Service: Cardiovascular;  Laterality: N/A;  . MELANOMA EXCISION  1992   right chin, back  . POLYPECTOMY    . SALPINGOOPHORECTOMY  2009   Right  . TOTAL KNEE ARTHROPLASTY Right 06/10/2013   Procedure: RIGHT TOTAL KNEE ARTHROPLASTY;  Surgeon: [name], MD;  Location: [facility];  Service: Orthopedics;  Laterality: Right;  . TOTAL KNEE ARTHROPLASTY Left 12/16/2013   Procedure: LEFT TOTAL KNEE ARTHROPLASTY;  Surgeon: [name], MD;  Location: [facility];  Service: Orthopedics;  Laterality: Left;     Current Outpatient Medications  Medication Sig Dispense Refill  . acetaminophen (TYLENOL) 650 MG CR tablet Take 1,300 mg by mouth every 8 (eight) hours as needed for pain.     Marland Kitchen aspirin EC 81 MG tablet Take 1 tablet (81 mg total) by mouth  daily. 90 tablet 3  . cetirizine (ZYRTEC) 10 MG tablet Take 10 mg by mouth daily as needed for allergies.    . citalopram (CELEXA) 20 MG tablet TAKE 1 TABLET AT BEDTIME (Patient taking differently: Take 20 mg by mouth at bedtime. ) 90 tablet 1  . estradiol (CLIMARA - DOSED IN MG/24 HR) 0.05 mg/24hr patch Place 1 patch (0.05 mg total) onto the skin once a week. (Patient taking differently: Place 0.05 mg onto the skin every Sunday. ) 12 patch 4  . furosemide (LASIX) 20 MG tablet TAKE 1 TABLET DAILY (Patient taking differently: Take 20 mg by mouth daily. ) 90 tablet 1  . gabapentin (NEURONTIN) 300 MG capsule Take 300 mg by mouth 3 (three) times daily.     Marland Kitchen losartan (COZAAR) 100 MG tablet Take 1 tablet (100 mg total) by mouth daily. 90 tablet 0  . Misc Natural Products (TURMERIC CURCUMIN) CAPS Take 1 capsule by mouth daily.     . nitroGLYCERIN (NITROSTAT) 0.4 MG SL tablet Place 1 tablet (0.4 mg total) under the tongue every 5 (five) minutes as needed for chest pain. 25 tablet 3  . omega-3 acid ethyl esters (LOVAZA) 1 g capsule Take by mouth 2 (two) times daily.    . progesterone (PROMETRIUM) 100 MG capsule Take 1 capsule (100 mg total) by mouth at bedtime. 90 capsule 4  . rosuvastatin (CRESTOR) 40 MG tablet Take 1 tablet (40 mg total) by mouth daily. 30 tablet 6   No current facility-administered medications for this visit.    Allergies:   Patient has no known allergies.    ROS:  Please see the history of present illness.   Otherwise, review of systems are positive for none.   All other systems are reviewed and negative.    PHYSICAL EXAM: VS:  BP 106/72 (BP Location: Right Arm, Patient Position: Sitting, Cuff Size: Normal)   Pulse 66   Ht 5\' 6"  (1.676 m)   Wt 201 lb 9.6 oz (91.4 kg)   BMI 32.54 kg/m  , BMI Body mass index is 32.54 kg/m. GENERAL:  Well appearing NECK:  No jugular venous distention, waveform within normal limits, carotid upstroke brisk and symmetric, no bruits, no thyromegaly LUNGS:  Clear to auscultation bilaterally CHEST:  Unremarkable HEART:  PMI not displaced or sustained,S1 and S2 within normal limits, no S3, no S4, no clicks, no rubs, no murmurs ABD:  Flat, positive bowel sounds normal in frequency in pitch, no bruits, no rebound, no guarding, no midline pulsatile mass, no hepatomegaly, no splenomegaly EXT:  2 plus pulses throughout, no edema, no cyanosis no clubbing, right radial access site unremarkable   EKG:  EKG is not ordered today.    Recent Labs: 12/26/2018: ALT 22; TSH 1.17 07/08/2019: BUN 21; Creatinine, Ser 1.17; Hemoglobin 13.0; Platelets 311; Potassium 4.6; Sodium 141    Lipid Panel    Component Value Date/Time   CHOL 195 12/26/2018 0947   TRIG 196.0 (H) 12/26/2018 0947   HDL 45.10 12/26/2018 0947   CHOLHDL 4 12/26/2018 0947   VLDL 39.2 12/26/2018 0947    LDLCALC 111 (H) 12/26/2018 0947    Diagnostic Dominance: Right    Wt Readings from Last 3 Encounters:  09/20/19 201 lb 9.6 oz (91.4 kg)  07/11/19 200 lb (90.7 kg)  07/10/19 199 lb (90.3 kg)      Other studies Reviewed: Additional studies/ records that were reviewed today include: Event monitor strips personally reviewed. Review of the above records demonstrates:  Please see elsewhere in the note.     ASSESSMENT AND PLAN:  PALPITATION:   Patient is describing new palpitations.  I am going to have her wear another 3-day monitor.  I would like to go up on the beta-blocker but she says she has low heart rate and blood pressure so I would like her to keep a blood pressure and heart rate diary to see if I can uptitrate.   CKD IIIa: Her last creatinine was 1.17.  No change in therapy.  DYSLIPIDEMIA:     LDL previously was 111.  I think the goal needs to be at least less than 70.  I am going to stop her Lipitor and start Crestor 40 mg daily.   SLEEP APNEA:   She is being managed with CPAP.   CAD:     She has coronary disease as described.  We went over this.  I reviewed her films during this visit.  She will continue with aggressive risk reduction.  COVID EDUCATION: She has had Covid.   Current medicines are reviewed at length with the patient today.  The patient does not have concerns regarding medicines.  The following changes have been made: As above  Labs/ tests ordered today include:    Orders Placed This Encounter  Procedures  . LONG TERM MONITOR (3-14 DAYS)     Disposition:   FU with a new cardiologist in [place].  I will be happy to follow-up the results of an to suggest med titration or further therapies until she gets established with a new physician.Ronnell Guadalajara, MD  09/20/2019 2:10 PM    [facility]

## 2019-09-20 ENCOUNTER — Encounter: Payer: Self-pay | Admitting: Cardiology

## 2019-09-20 ENCOUNTER — Other Ambulatory Visit: Payer: Self-pay

## 2019-09-20 ENCOUNTER — Ambulatory Visit: Payer: Medicare Other | Admitting: Adult Health

## 2019-09-20 ENCOUNTER — Telehealth: Payer: Self-pay | Admitting: Radiology

## 2019-09-20 ENCOUNTER — Ambulatory Visit (INDEPENDENT_AMBULATORY_CARE_PROVIDER_SITE_OTHER): Payer: Medicare Other | Admitting: Cardiology

## 2019-09-20 VITALS — BP 106/72 | HR 66 | Ht 66.0 in | Wt 201.6 lb

## 2019-09-20 DIAGNOSIS — I2510 Atherosclerotic heart disease of native coronary artery without angina pectoris: Secondary | ICD-10-CM | POA: Diagnosis not present

## 2019-09-20 DIAGNOSIS — E785 Hyperlipidemia, unspecified: Secondary | ICD-10-CM | POA: Diagnosis not present

## 2019-09-20 DIAGNOSIS — N1831 Chronic kidney disease, stage 3a: Secondary | ICD-10-CM

## 2019-09-20 DIAGNOSIS — R002 Palpitations: Secondary | ICD-10-CM

## 2019-09-20 DIAGNOSIS — Z7189 Other specified counseling: Secondary | ICD-10-CM | POA: Diagnosis not present

## 2019-09-20 MED ORDER — ROSUVASTATIN CALCIUM 40 MG PO TABS: 40.0000 mg | ORAL_TABLET | Freq: Every day | ORAL | 6 refills | Status: DC

## 2019-09-20 NOTE — Telephone Encounter (Signed)
Enrolled patient for a 3 day Zio monitor to be mailed to patients home.  

## 2019-09-20 NOTE — Patient Instructions (Signed)
Medication Instructions:   STOP Atorvastatin (Lipitor)  START Rosuvastatin (Crestor) 40 mg daily.  *If you need a refill on your cardiac medications before your next appointment, please call your pharmacy*    Testing/Procedures: ZIO XT- Long Term Monitor Instructions   Your physician has requested you wear your ZIO patch monitor___3___days.   This is a single patch monitor.  [employer] supplies one patch monitor per enrollment.  Additional stickers are not available.   Please do not apply patch if you will be having a Nuclear Stress Test, Echocardiogram, Cardiac CT, MRI, or Chest Xray during the time frame you would be wearing the monitor. The patch cannot be worn during these tests.  You cannot remove and re-apply the ZIO XT patch monitor.   Your ZIO patch monitor will be sent USPS Priority mail from [facility] directly to your home address. The monitor may also be mailed to a PO BOX if home delivery is not available.   It may take 3-5 days to receive your monitor after you have been enrolled.   Once you have received you monitor, please review enclosed instructions.  Your monitor has already been registered assigning a specific monitor serial # to you.   Applying the monitor   Shave hair from upper left chest.   Hold abrader disc by orange tab.  Rub abrader in 40 strokes over left upper chest as indicated in your monitor instructions.   Clean area with 4 enclosed alcohol pads .  Use all pads to assure are is cleaned thoroughly.  Let dry.   Apply patch as indicated in monitor instructions.  Patch will be place under collarbone on left side of chest with arrow pointing upward.   Rub patch adhesive wings for 2 minutes.Remove white label marked "1".  Remove white label marked "2".  Rub patch adhesive wings for 2 additional minutes.   While looking in a mirror, press and release button in center of patch.  A small green light will flash 3-4 times .  This will be your only  indicator the monitor has been turned on.     Do not shower for the first 24 hours.  You may shower after the first 24 hours.   Press button if you feel a symptom. You will hear a small click.  Record Date, Time and Symptom in the Patient Log Book.   When you are ready to remove patch, follow instructions on last 2 pages of Patient Log Book.  Stick patch monitor onto last page of Patient Log Book.   Place Patient Log Book in [place] box.  Use locking tab on box and tape box closed securely.  The [employer] and [employer] has [employer] on it.  Please place in mailbox as soon as possible.  Your physician should have your test results approximately 7 days after the monitor has been mailed back to [facility].   Call [place] at [phone] if you have questions regarding your ZIO XT patch monitor.  Call them immediately if you see an orange light blinking on your monitor.   If your monitor falls off in less than 4 days contact our Monitor department at [phone].  If your monitor becomes loose or falls off after 4 days call [employer] at [phone] for suggestions on securing your monitor.    Other Instructions  Pulse Oximeter  Omron blood pressure cuff

## 2019-09-21 ENCOUNTER — Other Ambulatory Visit: Payer: Self-pay | Admitting: Internal Medicine

## 2019-09-21 DIAGNOSIS — F3342 Major depressive disorder, recurrent, in full remission: Secondary | ICD-10-CM

## 2019-09-24 ENCOUNTER — Other Ambulatory Visit (INDEPENDENT_AMBULATORY_CARE_PROVIDER_SITE_OTHER): Payer: Medicare Other

## 2019-09-24 DIAGNOSIS — R002 Palpitations: Secondary | ICD-10-CM

## 2019-09-25 ENCOUNTER — Other Ambulatory Visit: Payer: Self-pay | Admitting: *Deleted

## 2019-09-25 MED ORDER — ROSUVASTATIN CALCIUM 40 MG PO TABS: 40.0000 mg | ORAL_TABLET | Freq: Every day | ORAL | 2 refills | Status: DC

## 2019-10-07 ENCOUNTER — Other Ambulatory Visit: Payer: Self-pay

## 2019-10-09 ENCOUNTER — Encounter: Payer: Self-pay | Admitting: Internal Medicine

## 2019-10-09 DIAGNOSIS — R002 Palpitations: Secondary | ICD-10-CM | POA: Diagnosis not present

## 2019-10-11 ENCOUNTER — Other Ambulatory Visit: Payer: Self-pay

## 2019-10-25 ENCOUNTER — Telehealth: Payer: Self-pay | Admitting: Cardiology

## 2019-10-25 NOTE — Telephone Encounter (Signed)
    Pt c/o medication issue:  1. Name of Medication: Metoprolol succ ER 25 mg tablet  2. How are you currently taking this medication (dosage and times per day)? 1 tablet by mouth daily.  3. Are you having a reaction (difficulty breathing--STAT)?   4. What is your medication issue? Pt said she needs to refill her metoprolol but its not on her meds list. She said if Dr. Percival Spanish will refill it send it to  [place], [place]

## 2019-10-27 NOTE — Telephone Encounter (Signed)
I don't think she needs the beta blocker.  She reported a low heart rate and she had no significant arrhythmia on monitor.

## 2019-10-28 NOTE — Telephone Encounter (Signed)
No answer

## 2019-11-07 DIAGNOSIS — I2510 Atherosclerotic heart disease of native coronary artery without angina pectoris: Secondary | ICD-10-CM | POA: Diagnosis not present

## 2019-11-07 DIAGNOSIS — E559 Vitamin D deficiency, unspecified: Secondary | ICD-10-CM | POA: Diagnosis not present

## 2019-11-07 DIAGNOSIS — Z86010 Personal history of colonic polyps: Secondary | ICD-10-CM | POA: Diagnosis not present

## 2019-11-07 DIAGNOSIS — G4733 Obstructive sleep apnea (adult) (pediatric): Secondary | ICD-10-CM | POA: Diagnosis not present

## 2019-11-07 DIAGNOSIS — N1831 Chronic kidney disease, stage 3a: Secondary | ICD-10-CM | POA: Diagnosis not present

## 2019-11-07 DIAGNOSIS — F329 Major depressive disorder, single episode, unspecified: Secondary | ICD-10-CM | POA: Diagnosis not present

## 2019-11-07 DIAGNOSIS — G629 Polyneuropathy, unspecified: Secondary | ICD-10-CM | POA: Diagnosis not present

## 2019-11-07 DIAGNOSIS — L989 Disorder of the skin and subcutaneous tissue, unspecified: Secondary | ICD-10-CM | POA: Diagnosis not present

## 2019-11-07 DIAGNOSIS — E7800 Pure hypercholesterolemia, unspecified: Secondary | ICD-10-CM | POA: Diagnosis not present

## 2019-11-07 DIAGNOSIS — E669 Obesity, unspecified: Secondary | ICD-10-CM | POA: Diagnosis not present

## 2019-11-07 DIAGNOSIS — I10 Essential (primary) hypertension: Secondary | ICD-10-CM | POA: Diagnosis not present

## 2019-11-07 DIAGNOSIS — Z6833 Body mass index (BMI) 33.0-33.9, adult: Secondary | ICD-10-CM | POA: Diagnosis not present

## 2019-11-07 NOTE — Telephone Encounter (Signed)
Spoke with patient and she has been taking Toprol 25 mg for a while. She was taking when she wore monitor.  Patient actually got filled in [place] where she now resides. She did see NP today and has cardiology appointment in December  She will continue current medications

## 2019-11-27 DIAGNOSIS — S8992XA Unspecified injury of left lower leg, initial encounter: Secondary | ICD-10-CM | POA: Diagnosis not present

## 2019-11-27 DIAGNOSIS — E669 Obesity, unspecified: Secondary | ICD-10-CM | POA: Diagnosis not present

## 2019-11-27 DIAGNOSIS — R922 Inconclusive mammogram: Secondary | ICD-10-CM | POA: Diagnosis not present

## 2019-11-27 DIAGNOSIS — Z6833 Body mass index (BMI) 33.0-33.9, adult: Secondary | ICD-10-CM | POA: Diagnosis not present

## 2019-11-27 DIAGNOSIS — S6992XA Unspecified injury of left wrist, hand and finger(s), initial encounter: Secondary | ICD-10-CM | POA: Diagnosis not present

## 2019-11-27 DIAGNOSIS — Z96652 Presence of left artificial knee joint: Secondary | ICD-10-CM | POA: Diagnosis not present

## 2019-11-27 DIAGNOSIS — N6320 Unspecified lump in the left breast, unspecified quadrant: Secondary | ICD-10-CM | POA: Diagnosis not present

## 2019-11-28 DIAGNOSIS — Z85820 Personal history of malignant melanoma of skin: Secondary | ICD-10-CM | POA: Diagnosis not present

## 2019-11-28 DIAGNOSIS — L814 Other melanin hyperpigmentation: Secondary | ICD-10-CM | POA: Diagnosis not present

## 2019-11-28 DIAGNOSIS — Z08 Encounter for follow-up examination after completed treatment for malignant neoplasm: Secondary | ICD-10-CM | POA: Diagnosis not present

## 2019-11-28 DIAGNOSIS — D2239 Melanocytic nevi of other parts of face: Secondary | ICD-10-CM | POA: Diagnosis not present

## 2019-11-28 DIAGNOSIS — L821 Other seborrheic keratosis: Secondary | ICD-10-CM | POA: Diagnosis not present

## 2019-12-12 ENCOUNTER — Other Ambulatory Visit: Payer: Self-pay

## 2019-12-12 DIAGNOSIS — G4733 Obstructive sleep apnea (adult) (pediatric): Secondary | ICD-10-CM | POA: Diagnosis not present

## 2019-12-12 DIAGNOSIS — R062 Wheezing: Secondary | ICD-10-CM | POA: Diagnosis not present

## 2019-12-25 DIAGNOSIS — E782 Mixed hyperlipidemia: Secondary | ICD-10-CM | POA: Diagnosis not present

## 2019-12-25 DIAGNOSIS — I2510 Atherosclerotic heart disease of native coronary artery without angina pectoris: Secondary | ICD-10-CM | POA: Diagnosis not present

## 2019-12-25 DIAGNOSIS — I10 Essential (primary) hypertension: Secondary | ICD-10-CM | POA: Diagnosis not present

## 2019-12-25 DIAGNOSIS — N1831 Chronic kidney disease, stage 3a: Secondary | ICD-10-CM | POA: Diagnosis not present

## 2019-12-25 DIAGNOSIS — G4733 Obstructive sleep apnea (adult) (pediatric): Secondary | ICD-10-CM | POA: Diagnosis not present

## 2019-12-30 DIAGNOSIS — N2889 Other specified disorders of kidney and ureter: Secondary | ICD-10-CM | POA: Diagnosis not present

## 2019-12-30 DIAGNOSIS — E782 Mixed hyperlipidemia: Secondary | ICD-10-CM | POA: Diagnosis not present

## 2019-12-30 DIAGNOSIS — I10 Essential (primary) hypertension: Secondary | ICD-10-CM | POA: Diagnosis not present

## 2019-12-30 DIAGNOSIS — K760 Fatty (change of) liver, not elsewhere classified: Secondary | ICD-10-CM | POA: Diagnosis not present

## 2019-12-30 DIAGNOSIS — N1831 Chronic kidney disease, stage 3a: Secondary | ICD-10-CM | POA: Diagnosis not present

## 2019-12-30 DIAGNOSIS — N281 Cyst of kidney, acquired: Secondary | ICD-10-CM | POA: Diagnosis not present

## 2020-01-06 DIAGNOSIS — G5762 Lesion of plantar nerve, left lower limb: Secondary | ICD-10-CM | POA: Diagnosis not present

## 2020-01-06 DIAGNOSIS — M79672 Pain in left foot: Secondary | ICD-10-CM | POA: Diagnosis not present

## 2020-02-13 DIAGNOSIS — Z23 Encounter for immunization: Secondary | ICD-10-CM | POA: Diagnosis not present

## 2020-02-15 HISTORY — PX: NEPHRECTOMY: SHX65

## 2020-02-19 DIAGNOSIS — I10 Essential (primary) hypertension: Secondary | ICD-10-CM | POA: Diagnosis not present

## 2020-02-19 DIAGNOSIS — R0600 Dyspnea, unspecified: Secondary | ICD-10-CM | POA: Diagnosis not present

## 2020-02-19 DIAGNOSIS — R002 Palpitations: Secondary | ICD-10-CM | POA: Diagnosis not present

## 2020-02-19 DIAGNOSIS — R0609 Other forms of dyspnea: Secondary | ICD-10-CM | POA: Diagnosis not present

## 2020-02-24 DIAGNOSIS — D4102 Neoplasm of uncertain behavior of left kidney: Secondary | ICD-10-CM | POA: Diagnosis not present

## 2020-03-02 ENCOUNTER — Other Ambulatory Visit: Payer: Self-pay | Admitting: Obstetrics & Gynecology

## 2020-03-03 NOTE — Telephone Encounter (Signed)
Spoke with patient in regard to refill request for Climara 0.05 mg weekly patch. Patient states she has been using patch weekly, cutting in half. Last AEX 02/12/19. She has since relocated to [place], would like to discuss if continuing HRT is appropriate. 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 Patient is unsure of last MMG, thought her last one was at [facility]. Last MMG in Epic is a left breast Dx 08/28/17, screening 02/15/17. She plans to transfer her care once she has established with a NGYN, but has not done that yet. Offered MyChart visit to further discuss HRT, patient agreeable. MyChart visit scheduled for 1/19 at 9am with Dr. Dellis Filbert. Patient is agreeable to date and time.   Rx denied.   Encounter closed.

## 2020-03-04 ENCOUNTER — Encounter: Payer: Self-pay | Admitting: Obstetrics & Gynecology

## 2020-03-04 ENCOUNTER — Telehealth (INDEPENDENT_AMBULATORY_CARE_PROVIDER_SITE_OTHER): Payer: Medicare Other | Admitting: Obstetrics & Gynecology

## 2020-03-04 ENCOUNTER — Other Ambulatory Visit: Payer: Self-pay

## 2020-03-04 DIAGNOSIS — N951 Menopausal and female climacteric states: Secondary | ICD-10-CM | POA: Diagnosis not present

## 2020-03-04 NOTE — Progress Notes (Signed)
    Kelsey Church May 07, 1950 [id number]        70 y.o.  G2P0101 Video visit after verbal consent.  Patient well identified.  Counseling on HRT between patient in [place] (new residency location) and myself in my [place] office.  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  Currently using 1/2 an Estradiol patch of 0.05 and Prometrium 100 mg HS. No PMB. Under Cardio investigation for Coronary artery disease.  Recent CT of Thorax didn't reveal PE.  Echocardiogram wnl per patient.  Currently on BB ASA.  cHTN controled on medication. 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  Patient on Anti-Depressants.  No Fam h/o or personal h/o Breast Ca.   OB History  Gravida Para Term Preterm AB Living  2 1   1  0 1  SAB IAB Ectopic Multiple Live Births      0        # Outcome Date GA Lbr Len/2nd Weight Sex Delivery Anes PTL Lv  2 Gravida           1 Preterm             Past medical history,surgical history, problem list, medications, allergies, family history and social history were all reviewed and documented in the EPIC chart.   Directed ROS with pertinent positives and negatives documented in the history of present illness/assessment and plan.  Exam:  There were no vitals filed for this visit. General appearance:  Normal  Video visit   Assessment/Plan:  70 y.o. G2P0101   1. Post menopausal syndrome Persistent postmenopausal syndrome with hot flashes and night sweats when attempts at weaning hormone replacement therapy.  Relatively high risk factors at age 12 for hormone replacement therapy including cardiovascular disease with coronary artery disease and chronic hypertension with increased risks of blood clots/PE and Stroke.  On baby aspirin and antihypertensives.  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  On antidepressants.  Patient will now stop hormone replacement  therapy in preparation for surgery.  Recommend using only Phyto-estrogens as needed.  We will see family physician in the near future and will establish with a gynecologist in [place].  Patient voiced understanding and agreement with plan.  Princess Bruins MD, 9:29 AM 03/04/2020

## 2020-03-09 DIAGNOSIS — D4102 Neoplasm of uncertain behavior of left kidney: Secondary | ICD-10-CM | POA: Diagnosis not present

## 2020-03-25 ENCOUNTER — Other Ambulatory Visit: Payer: Self-pay | Admitting: Obstetrics & Gynecology

## 2020-03-25 DIAGNOSIS — Z7982 Long term (current) use of aspirin: Secondary | ICD-10-CM | POA: Diagnosis not present

## 2020-03-25 DIAGNOSIS — K219 Gastro-esophageal reflux disease without esophagitis: Secondary | ICD-10-CM | POA: Diagnosis not present

## 2020-03-25 DIAGNOSIS — N2889 Other specified disorders of kidney and ureter: Secondary | ICD-10-CM | POA: Diagnosis not present

## 2020-03-25 DIAGNOSIS — C642 Malignant neoplasm of left kidney, except renal pelvis: Secondary | ICD-10-CM | POA: Diagnosis not present

## 2020-03-25 DIAGNOSIS — D4102 Neoplasm of uncertain behavior of left kidney: Secondary | ICD-10-CM | POA: Diagnosis not present

## 2020-03-25 DIAGNOSIS — N189 Chronic kidney disease, unspecified: Secondary | ICD-10-CM | POA: Diagnosis not present

## 2020-03-25 DIAGNOSIS — I129 Hypertensive chronic kidney disease with stage 1 through stage 4 chronic kidney disease, or unspecified chronic kidney disease: Secondary | ICD-10-CM | POA: Diagnosis not present

## 2020-03-25 DIAGNOSIS — E7800 Pure hypercholesterolemia, unspecified: Secondary | ICD-10-CM | POA: Diagnosis not present

## 2020-03-25 DIAGNOSIS — Z79899 Other long term (current) drug therapy: Secondary | ICD-10-CM | POA: Diagnosis not present

## 2020-03-25 DIAGNOSIS — G8918 Other acute postprocedural pain: Secondary | ICD-10-CM | POA: Diagnosis not present

## 2020-03-25 NOTE — Telephone Encounter (Signed)
03/04/2020 Dr. Dellis Filbert wrote "1. Post menopausal syndrome Persistent postmenopausal syndrome with hot flashes and night sweats when attempts at weaning hormone replacement therapy.  Relatively high risk factors at age 70 for hormone replacement therapy including cardiovascular disease with coronary artery disease and chronic hypertension with increased risks of blood clots/PE and Stroke.  On baby aspirin and antihypertensives.  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  On antidepressants.  Patient will now stop hormone replacement therapy in preparation for surgery.  Recommend using only Phyto-estrogens as needed.  We will see family physician in the near future and will establish with a gynecologist in [place].  Patient voiced understanding and agreement with plan."

## 2020-04-10 DIAGNOSIS — I10 Essential (primary) hypertension: Secondary | ICD-10-CM | POA: Diagnosis not present

## 2020-04-10 DIAGNOSIS — R9439 Abnormal result of other cardiovascular function study: Secondary | ICD-10-CM | POA: Diagnosis not present

## 2020-04-10 DIAGNOSIS — Z6832 Body mass index (BMI) 32.0-32.9, adult: Secondary | ICD-10-CM | POA: Diagnosis not present

## 2020-04-10 DIAGNOSIS — E785 Hyperlipidemia, unspecified: Secondary | ICD-10-CM | POA: Diagnosis not present

## 2020-04-10 DIAGNOSIS — N2889 Other specified disorders of kidney and ureter: Secondary | ICD-10-CM | POA: Diagnosis not present

## 2020-04-10 DIAGNOSIS — N1831 Chronic kidney disease, stage 3a: Secondary | ICD-10-CM | POA: Diagnosis not present

## 2020-04-10 DIAGNOSIS — Z136 Encounter for screening for cardiovascular disorders: Secondary | ICD-10-CM | POA: Diagnosis not present

## 2020-04-10 DIAGNOSIS — I2510 Atherosclerotic heart disease of native coronary artery without angina pectoris: Secondary | ICD-10-CM | POA: Diagnosis not present

## 2020-04-10 DIAGNOSIS — R0609 Other forms of dyspnea: Secondary | ICD-10-CM | POA: Diagnosis not present

## 2020-04-10 DIAGNOSIS — R002 Palpitations: Secondary | ICD-10-CM | POA: Diagnosis not present

## 2020-04-13 LAB — LIPID PANEL
Cholesterol: 112 (ref 0–200)
HDL: 59 (ref 35–70)
LDL Cholesterol: 42
Triglycerides: 90 (ref 40–160)

## 2020-04-24 DIAGNOSIS — A048 Other specified bacterial intestinal infections: Secondary | ICD-10-CM | POA: Diagnosis not present

## 2020-04-24 DIAGNOSIS — Z6832 Body mass index (BMI) 32.0-32.9, adult: Secondary | ICD-10-CM | POA: Diagnosis not present

## 2020-04-24 DIAGNOSIS — R109 Unspecified abdominal pain: Secondary | ICD-10-CM | POA: Diagnosis not present

## 2020-04-24 DIAGNOSIS — K9589 Other complications of other bariatric procedure: Secondary | ICD-10-CM | POA: Diagnosis not present

## 2020-04-27 ENCOUNTER — Encounter: Payer: Self-pay | Admitting: Internal Medicine

## 2020-04-27 DIAGNOSIS — N2889 Other specified disorders of kidney and ureter: Secondary | ICD-10-CM | POA: Diagnosis not present

## 2020-04-27 DIAGNOSIS — R1011 Right upper quadrant pain: Secondary | ICD-10-CM | POA: Diagnosis not present

## 2020-04-27 DIAGNOSIS — R109 Unspecified abdominal pain: Secondary | ICD-10-CM | POA: Diagnosis not present

## 2020-05-06 DIAGNOSIS — I2510 Atherosclerotic heart disease of native coronary artery without angina pectoris: Secondary | ICD-10-CM | POA: Diagnosis not present

## 2020-05-06 DIAGNOSIS — K5900 Constipation, unspecified: Secondary | ICD-10-CM | POA: Diagnosis not present

## 2020-05-06 DIAGNOSIS — N1831 Chronic kidney disease, stage 3a: Secondary | ICD-10-CM | POA: Diagnosis not present

## 2020-05-06 DIAGNOSIS — I10 Essential (primary) hypertension: Secondary | ICD-10-CM | POA: Diagnosis not present

## 2020-05-06 DIAGNOSIS — E669 Obesity, unspecified: Secondary | ICD-10-CM | POA: Diagnosis not present

## 2020-05-06 DIAGNOSIS — R140 Abdominal distension (gaseous): Secondary | ICD-10-CM | POA: Diagnosis not present

## 2020-05-06 DIAGNOSIS — Z6832 Body mass index (BMI) 32.0-32.9, adult: Secondary | ICD-10-CM | POA: Diagnosis not present

## 2020-05-07 DIAGNOSIS — K5790 Diverticulosis of intestine, part unspecified, without perforation or abscess without bleeding: Secondary | ICD-10-CM | POA: Diagnosis not present

## 2020-05-07 DIAGNOSIS — N183 Chronic kidney disease, stage 3 unspecified: Secondary | ICD-10-CM | POA: Diagnosis not present

## 2020-05-07 DIAGNOSIS — C642 Malignant neoplasm of left kidney, except renal pelvis: Secondary | ICD-10-CM | POA: Diagnosis not present

## 2020-05-07 DIAGNOSIS — K56609 Unspecified intestinal obstruction, unspecified as to partial versus complete obstruction: Secondary | ICD-10-CM | POA: Diagnosis not present

## 2020-05-13 DIAGNOSIS — N183 Chronic kidney disease, stage 3 unspecified: Secondary | ICD-10-CM | POA: Diagnosis not present

## 2020-05-13 DIAGNOSIS — C642 Malignant neoplasm of left kidney, except renal pelvis: Secondary | ICD-10-CM | POA: Diagnosis not present

## 2020-05-13 DIAGNOSIS — R609 Edema, unspecified: Secondary | ICD-10-CM | POA: Diagnosis not present

## 2020-05-13 DIAGNOSIS — Z79899 Other long term (current) drug therapy: Secondary | ICD-10-CM | POA: Diagnosis not present

## 2020-05-14 DIAGNOSIS — M7989 Other specified soft tissue disorders: Secondary | ICD-10-CM | POA: Diagnosis not present

## 2020-05-14 DIAGNOSIS — C642 Malignant neoplasm of left kidney, except renal pelvis: Secondary | ICD-10-CM | POA: Diagnosis not present

## 2020-05-18 DIAGNOSIS — K5900 Constipation, unspecified: Secondary | ICD-10-CM | POA: Diagnosis not present

## 2020-05-18 DIAGNOSIS — Z7902 Long term (current) use of antithrombotics/antiplatelets: Secondary | ICD-10-CM | POA: Diagnosis not present

## 2020-05-18 DIAGNOSIS — C649 Malignant neoplasm of unspecified kidney, except renal pelvis: Secondary | ICD-10-CM | POA: Diagnosis not present

## 2020-05-18 DIAGNOSIS — R194 Change in bowel habit: Secondary | ICD-10-CM | POA: Diagnosis not present

## 2020-05-19 DIAGNOSIS — N1831 Chronic kidney disease, stage 3a: Secondary | ICD-10-CM | POA: Diagnosis not present

## 2020-05-19 DIAGNOSIS — I129 Hypertensive chronic kidney disease with stage 1 through stage 4 chronic kidney disease, or unspecified chronic kidney disease: Secondary | ICD-10-CM | POA: Diagnosis not present

## 2020-05-19 DIAGNOSIS — Z85528 Personal history of other malignant neoplasm of kidney: Secondary | ICD-10-CM | POA: Diagnosis not present

## 2020-05-19 DIAGNOSIS — Z905 Acquired absence of kidney: Secondary | ICD-10-CM | POA: Diagnosis not present

## 2020-05-19 DIAGNOSIS — N140 Analgesic nephropathy: Secondary | ICD-10-CM | POA: Diagnosis not present

## 2020-05-20 DIAGNOSIS — I2510 Atherosclerotic heart disease of native coronary artery without angina pectoris: Secondary | ICD-10-CM | POA: Diagnosis not present

## 2020-05-22 DIAGNOSIS — K56609 Unspecified intestinal obstruction, unspecified as to partial versus complete obstruction: Secondary | ICD-10-CM | POA: Diagnosis not present

## 2020-05-22 DIAGNOSIS — R609 Edema, unspecified: Secondary | ICD-10-CM | POA: Diagnosis not present

## 2020-05-22 DIAGNOSIS — C642 Malignant neoplasm of left kidney, except renal pelvis: Secondary | ICD-10-CM | POA: Diagnosis not present

## 2020-05-22 DIAGNOSIS — Z79899 Other long term (current) drug therapy: Secondary | ICD-10-CM | POA: Diagnosis not present

## 2020-05-22 DIAGNOSIS — N183 Chronic kidney disease, stage 3 unspecified: Secondary | ICD-10-CM | POA: Diagnosis not present

## 2020-05-27 LAB — HM COLONOSCOPY

## 2020-05-28 DIAGNOSIS — Z08 Encounter for follow-up examination after completed treatment for malignant neoplasm: Secondary | ICD-10-CM | POA: Diagnosis not present

## 2020-05-28 DIAGNOSIS — B078 Other viral warts: Secondary | ICD-10-CM | POA: Diagnosis not present

## 2020-05-28 DIAGNOSIS — L820 Inflamed seborrheic keratosis: Secondary | ICD-10-CM | POA: Diagnosis not present

## 2020-05-28 DIAGNOSIS — Z7189 Other specified counseling: Secondary | ICD-10-CM | POA: Diagnosis not present

## 2020-05-28 DIAGNOSIS — D485 Neoplasm of uncertain behavior of skin: Secondary | ICD-10-CM | POA: Diagnosis not present

## 2020-05-28 DIAGNOSIS — D2262 Melanocytic nevi of left upper limb, including shoulder: Secondary | ICD-10-CM | POA: Diagnosis not present

## 2020-05-28 DIAGNOSIS — D2239 Melanocytic nevi of other parts of face: Secondary | ICD-10-CM | POA: Diagnosis not present

## 2020-05-28 DIAGNOSIS — Z86006 Personal history of melanoma in-situ: Secondary | ICD-10-CM | POA: Diagnosis not present

## 2020-05-28 DIAGNOSIS — D220 Melanocytic nevi of lip: Secondary | ICD-10-CM | POA: Diagnosis not present

## 2020-05-28 DIAGNOSIS — L821 Other seborrheic keratosis: Secondary | ICD-10-CM | POA: Diagnosis not present

## 2020-06-03 DIAGNOSIS — D175 Benign lipomatous neoplasm of intra-abdominal organs: Secondary | ICD-10-CM | POA: Diagnosis not present

## 2020-06-03 DIAGNOSIS — D1779 Benign lipomatous neoplasm of other sites: Secondary | ICD-10-CM | POA: Diagnosis not present

## 2020-06-03 DIAGNOSIS — R194 Change in bowel habit: Secondary | ICD-10-CM | POA: Diagnosis not present

## 2020-06-15 DIAGNOSIS — D6489 Other specified anemias: Secondary | ICD-10-CM | POA: Diagnosis not present

## 2020-06-15 DIAGNOSIS — K56609 Unspecified intestinal obstruction, unspecified as to partial versus complete obstruction: Secondary | ICD-10-CM | POA: Diagnosis not present

## 2020-06-15 DIAGNOSIS — C642 Malignant neoplasm of left kidney, except renal pelvis: Secondary | ICD-10-CM | POA: Diagnosis not present

## 2020-06-15 DIAGNOSIS — Z79899 Other long term (current) drug therapy: Secondary | ICD-10-CM | POA: Diagnosis not present

## 2020-06-15 DIAGNOSIS — N183 Chronic kidney disease, stage 3 unspecified: Secondary | ICD-10-CM | POA: Diagnosis not present

## 2020-06-17 DIAGNOSIS — Z6832 Body mass index (BMI) 32.0-32.9, adult: Secondary | ICD-10-CM | POA: Diagnosis not present

## 2020-06-17 DIAGNOSIS — C642 Malignant neoplasm of left kidney, except renal pelvis: Secondary | ICD-10-CM | POA: Diagnosis not present

## 2020-06-17 DIAGNOSIS — R928 Other abnormal and inconclusive findings on diagnostic imaging of breast: Secondary | ICD-10-CM | POA: Diagnosis not present

## 2020-06-17 DIAGNOSIS — N1831 Chronic kidney disease, stage 3a: Secondary | ICD-10-CM | POA: Diagnosis not present

## 2020-06-17 DIAGNOSIS — Z0000 Encounter for general adult medical examination without abnormal findings: Secondary | ICD-10-CM | POA: Diagnosis not present

## 2020-06-17 DIAGNOSIS — E669 Obesity, unspecified: Secondary | ICD-10-CM | POA: Diagnosis not present

## 2020-06-17 DIAGNOSIS — Z1331 Encounter for screening for depression: Secondary | ICD-10-CM | POA: Diagnosis not present

## 2020-06-17 DIAGNOSIS — I2510 Atherosclerotic heart disease of native coronary artery without angina pectoris: Secondary | ICD-10-CM | POA: Diagnosis not present

## 2020-06-17 DIAGNOSIS — E7800 Pure hypercholesterolemia, unspecified: Secondary | ICD-10-CM | POA: Diagnosis not present

## 2020-06-17 DIAGNOSIS — Z1389 Encounter for screening for other disorder: Secondary | ICD-10-CM | POA: Diagnosis not present

## 2020-06-17 DIAGNOSIS — I10 Essential (primary) hypertension: Secondary | ICD-10-CM | POA: Diagnosis not present

## 2020-06-19 DIAGNOSIS — N6002 Solitary cyst of left breast: Secondary | ICD-10-CM | POA: Diagnosis not present

## 2020-06-19 DIAGNOSIS — R928 Other abnormal and inconclusive findings on diagnostic imaging of breast: Secondary | ICD-10-CM | POA: Diagnosis not present

## 2020-06-22 DIAGNOSIS — I10 Essential (primary) hypertension: Secondary | ICD-10-CM | POA: Diagnosis not present

## 2020-06-22 DIAGNOSIS — I2510 Atherosclerotic heart disease of native coronary artery without angina pectoris: Secondary | ICD-10-CM | POA: Diagnosis not present

## 2020-06-25 DIAGNOSIS — R059 Cough, unspecified: Secondary | ICD-10-CM | POA: Diagnosis not present

## 2020-06-25 DIAGNOSIS — R609 Edema, unspecified: Secondary | ICD-10-CM | POA: Diagnosis not present

## 2020-06-25 DIAGNOSIS — Z20822 Contact with and (suspected) exposure to covid-19: Secondary | ICD-10-CM | POA: Diagnosis not present

## 2020-06-25 DIAGNOSIS — N183 Chronic kidney disease, stage 3 unspecified: Secondary | ICD-10-CM | POA: Diagnosis not present

## 2020-06-25 DIAGNOSIS — Z79899 Other long term (current) drug therapy: Secondary | ICD-10-CM | POA: Diagnosis not present

## 2020-06-25 DIAGNOSIS — J209 Acute bronchitis, unspecified: Secondary | ICD-10-CM | POA: Diagnosis not present

## 2020-06-25 DIAGNOSIS — K56609 Unspecified intestinal obstruction, unspecified as to partial versus complete obstruction: Secondary | ICD-10-CM | POA: Diagnosis not present

## 2020-06-26 DIAGNOSIS — E860 Dehydration: Secondary | ICD-10-CM | POA: Diagnosis not present

## 2020-06-26 DIAGNOSIS — C642 Malignant neoplasm of left kidney, except renal pelvis: Secondary | ICD-10-CM | POA: Diagnosis not present

## 2020-06-29 DIAGNOSIS — E860 Dehydration: Secondary | ICD-10-CM | POA: Diagnosis not present

## 2020-06-29 DIAGNOSIS — R8290 Unspecified abnormal findings in urine: Secondary | ICD-10-CM | POA: Diagnosis not present

## 2020-06-29 DIAGNOSIS — C642 Malignant neoplasm of left kidney, except renal pelvis: Secondary | ICD-10-CM | POA: Diagnosis not present

## 2020-07-03 DIAGNOSIS — Z79899 Other long term (current) drug therapy: Secondary | ICD-10-CM | POA: Diagnosis not present

## 2020-07-03 DIAGNOSIS — R609 Edema, unspecified: Secondary | ICD-10-CM | POA: Diagnosis not present

## 2020-07-03 DIAGNOSIS — C642 Malignant neoplasm of left kidney, except renal pelvis: Secondary | ICD-10-CM | POA: Diagnosis not present

## 2020-07-03 DIAGNOSIS — N183 Chronic kidney disease, stage 3 unspecified: Secondary | ICD-10-CM | POA: Diagnosis not present

## 2020-07-03 DIAGNOSIS — K56609 Unspecified intestinal obstruction, unspecified as to partial versus complete obstruction: Secondary | ICD-10-CM | POA: Diagnosis not present

## 2020-07-09 DIAGNOSIS — K56609 Unspecified intestinal obstruction, unspecified as to partial versus complete obstruction: Secondary | ICD-10-CM | POA: Diagnosis not present

## 2020-07-09 DIAGNOSIS — I2510 Atherosclerotic heart disease of native coronary artery without angina pectoris: Secondary | ICD-10-CM | POA: Diagnosis not present

## 2020-07-09 DIAGNOSIS — N1831 Chronic kidney disease, stage 3a: Secondary | ICD-10-CM | POA: Diagnosis not present

## 2020-07-09 DIAGNOSIS — Z79899 Other long term (current) drug therapy: Secondary | ICD-10-CM | POA: Diagnosis not present

## 2020-07-09 DIAGNOSIS — C642 Malignant neoplasm of left kidney, except renal pelvis: Secondary | ICD-10-CM | POA: Diagnosis not present

## 2020-07-09 DIAGNOSIS — I129 Hypertensive chronic kidney disease with stage 1 through stage 4 chronic kidney disease, or unspecified chronic kidney disease: Secondary | ICD-10-CM | POA: Diagnosis not present

## 2020-07-09 DIAGNOSIS — N183 Chronic kidney disease, stage 3 unspecified: Secondary | ICD-10-CM | POA: Diagnosis not present

## 2020-07-09 DIAGNOSIS — N179 Acute kidney failure, unspecified: Secondary | ICD-10-CM | POA: Diagnosis not present

## 2020-07-17 DIAGNOSIS — E785 Hyperlipidemia, unspecified: Secondary | ICD-10-CM | POA: Diagnosis not present

## 2020-07-17 DIAGNOSIS — N1831 Chronic kidney disease, stage 3a: Secondary | ICD-10-CM | POA: Diagnosis not present

## 2020-07-17 DIAGNOSIS — I2510 Atherosclerotic heart disease of native coronary artery without angina pectoris: Secondary | ICD-10-CM | POA: Diagnosis not present

## 2020-07-17 DIAGNOSIS — R002 Palpitations: Secondary | ICD-10-CM | POA: Diagnosis not present

## 2020-07-17 DIAGNOSIS — Z6832 Body mass index (BMI) 32.0-32.9, adult: Secondary | ICD-10-CM | POA: Diagnosis not present

## 2020-07-17 DIAGNOSIS — I10 Essential (primary) hypertension: Secondary | ICD-10-CM | POA: Diagnosis not present

## 2020-07-21 DIAGNOSIS — Z6831 Body mass index (BMI) 31.0-31.9, adult: Secondary | ICD-10-CM | POA: Diagnosis not present

## 2020-07-21 DIAGNOSIS — T63481A Toxic effect of venom of other arthropod, accidental (unintentional), initial encounter: Secondary | ICD-10-CM | POA: Diagnosis not present

## 2020-07-21 DIAGNOSIS — M25512 Pain in left shoulder: Secondary | ICD-10-CM | POA: Diagnosis not present

## 2020-07-21 DIAGNOSIS — E669 Obesity, unspecified: Secondary | ICD-10-CM | POA: Diagnosis not present

## 2020-07-21 DIAGNOSIS — I10 Essential (primary) hypertension: Secondary | ICD-10-CM | POA: Diagnosis not present

## 2020-07-27 ENCOUNTER — Encounter: Payer: Self-pay | Admitting: Internal Medicine

## 2020-08-05 ENCOUNTER — Telehealth: Payer: Self-pay | Admitting: Internal Medicine

## 2020-08-05 ENCOUNTER — Ambulatory Visit (INDEPENDENT_AMBULATORY_CARE_PROVIDER_SITE_OTHER): Payer: Medicare Other | Admitting: Family Medicine

## 2020-08-05 ENCOUNTER — Other Ambulatory Visit: Payer: Self-pay

## 2020-08-05 ENCOUNTER — Encounter: Payer: Self-pay | Admitting: Family Medicine

## 2020-08-05 VITALS — BP 132/80 | HR 74 | Temp 98.3°F | Ht 66.0 in | Wt 197.0 lb

## 2020-08-05 DIAGNOSIS — N184 Chronic kidney disease, stage 4 (severe): Secondary | ICD-10-CM

## 2020-08-05 DIAGNOSIS — R2681 Unsteadiness on feet: Secondary | ICD-10-CM | POA: Diagnosis not present

## 2020-08-05 DIAGNOSIS — Z905 Acquired absence of kidney: Secondary | ICD-10-CM

## 2020-08-05 DIAGNOSIS — M546 Pain in thoracic spine: Secondary | ICD-10-CM

## 2020-08-05 DIAGNOSIS — Z85528 Personal history of other malignant neoplasm of kidney: Secondary | ICD-10-CM | POA: Diagnosis not present

## 2020-08-05 DIAGNOSIS — R531 Weakness: Secondary | ICD-10-CM | POA: Diagnosis not present

## 2020-08-05 DIAGNOSIS — I25118 Atherosclerotic heart disease of native coronary artery with other forms of angina pectoris: Secondary | ICD-10-CM | POA: Diagnosis not present

## 2020-08-05 NOTE — Telephone Encounter (Signed)
   Patients son calling to report patient had recent fall. Hematoma on arm and patient is not herself, lethargic  Call transferred to [facility]

## 2020-08-05 NOTE — Progress Notes (Signed)
Cardiology Office Note   Date:  08/06/2020   ID:  Kelsey Church, DOB [birth year], MRN [id number]  PCP:  Janith Lima, MD  Cardiologist:   Minus Breeding, MD Referring:  Janith Lima, MD  Chief Complaint  Patient presents with   Shortness of Breath       History of Present Illness: Kelsey Church is a 70 y.o. female who was referred by Janith Lima, MD for evaluation of CAD.   She had an abnormal POET (Plain Old Exercise Treadmill) and had cath with results below.  She is managed medically for CAD.   She had predominantly small vessel or distal vessel disease.  She had nonobstructive disease in proximal large vessel and mid large vessels.    We have chosen to manage her medically and then she moved to [place].  Since then she was followed by her cardiologist and was complaining of increased shortness of breath.  He had considered sending her for bypass but prior to this he worked up the possibility of pulmonary embolism.  I do not have all of these records however, she comes with her daughter-in-law who is a Marine scientist and provides a good history.  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  She subsequently developed renal insufficiency with a creatinine peaking at 4.7.  It was thought that this could have been her Crestor which reduced to 5 mg although she and her daughter-in-law think it was related to [facility] that was being used.  Her creatinine did subsequently come down to 3.3.  She has not had any further cardiac work-up but there were no apparent cardiac complications during her nephrectomy.   Since coming back here where she is now moved again, she has not been doing as much activity although she does get short of breath climbing a flight of stairs.  This has been really slowly progressive.  She does report that in December of last year she had severe shortness  of breath while in the airport.  She is not getting any resting shortness of breath and has no PND or orthopnea.  She is had no chest pressure, neck or arm discomfort.  She has had no weight gain or edema.  She has been dizzy but she has not had any presyncope or syncope.  She has fallen twice.  She is going to be following up with nephrology and will be referred to oncology as well but has not yet established.   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/10/13   Cancer ([place])    melanoma in 2 sights   Complication of anesthesia    "chills every evening at sundown x 2 weeks"- no fever   Depression    Dysphagia    History of shingles    NO RESIDUAL PROBLEMS   Hypertension    Pain    LOWER BACK PAIN   Sleep apnea    CPAP    Past Surgical History:  Procedure Laterality Date   APPENDECTOMY  1958   COLONOSCOPY     COLONOSCOPY W/ POLYPECTOMY     7 polyps   FOOT SURGERY Right    removal plantar wart   GALLBLADDER SURGERY  02/14/1990   INJECTION KNEE  09/2011   right    KNEE  ARTHROSCOPY  1996   LEFT HEART CATH AND CORONARY ANGIOGRAPHY N/A 07/11/2019   Procedure: LEFT HEART CATH AND CORONARY ANGIOGRAPHY;  Surgeon: Belva Crome, MD;  Location: [facility];  Service: Cardiovascular;  Laterality: N/A;   MELANOMA EXCISION  1992   right chin, back   POLYPECTOMY     SALPINGOOPHORECTOMY  2009   Right   TOTAL KNEE ARTHROPLASTY Right 06/10/2013   Procedure: RIGHT TOTAL KNEE ARTHROPLASTY;  Surgeon: [name], MD;  Location: [facility];  Service: Orthopedics;  Laterality: Right;   TOTAL KNEE ARTHROPLASTY Left 12/16/2013   Procedure: LEFT TOTAL KNEE ARTHROPLASTY;  Surgeon: [name], MD;  Location: [facility];  Service: Orthopedics;  Laterality: Left;     Current Outpatient Medications  Medication Sig Dispense Refill   acetaminophen (TYLENOL) 650 MG CR tablet Take 1,300 mg by mouth every 8 (eight) hours as  needed for pain.      albuterol (VENTOLIN HFA) 108 (90 Base) MCG/ACT inhaler albuterol sulfate HFA 90 mcg/actuation aerosol inhaler     aspirin EC 81 MG tablet Take 1 tablet (81 mg total) by mouth daily. 90 tablet 3   cetirizine (ZYRTEC) 10 MG tablet Take 10 mg by mouth daily as needed for allergies.     citalopram (CELEXA) 20 MG tablet TAKE 1 TABLET AT BEDTIME 90 tablet 1   gabapentin (NEURONTIN) 300 MG capsule Take 300 mg by mouth 3 (three) times daily.      nitroGLYCERIN (NITROSTAT) 0.4 MG SL tablet Place 1 tablet (0.4 mg total) under the tongue every 5 (five) minutes as needed for chest pain. 25 tablet 3   ranolazine (RANEXA) 500 MG 12 hr tablet Take 1 tablet by mouth 2 (two) times daily.     rosuvastatin (CRESTOR) 5 MG tablet Take 5 mg by mouth daily.     metoprolol succinate (TOPROL-XL) 25 MG 24 hr tablet Take 2 tablets (50 mg total) by mouth in the morning AND 1 tablet (25 mg total) every evening. 270 tablet 3   No current facility-administered medications for this visit.    Allergies:   Patient has no known allergies.    ROS:  Please see the history of present illness.   Otherwise, review of systems are positive for none.   All other systems are reviewed and negative.    PHYSICAL EXAM: VS:  BP 116/62 (BP Location: Right Arm, Patient Position: Sitting, Cuff Size: Large)   Pulse 71   Ht 5\' 6"  (1.676 m)   Wt 198 lb 12.8 oz (90.2 kg)   SpO2 97%   BMI 32.09 kg/m  , BMI Body mass index is 32.09 kg/m. GENERAL:  Well appearing NECK:  No jugular venous distention, waveform within normal limits, carotid upstroke brisk and symmetric, no bruits, no thyromegaly LUNGS:  Clear to auscultation bilaterally CHEST:  Unremarkable HEART:  PMI not displaced or sustained,S1 and S2 within normal limits, no S3, no S4, no clicks, no rubs, no murmurs ABD:  Flat, positive bowel sounds normal in frequency in pitch, no bruits, no rebound, no guarding, no midline pulsatile mass, no hepatomegaly, no  splenomegaly EXT:  2 plus pulses throughout, mild leg greater than right edema, no cyanosis no clubbing  EKG:  EKG is  ordered today. Normal sinus rhythm, rate 71, axis within normal limits, intervals within normal's, no acute ST-T wave changes.   Recent Labs: 08/05/2020: BUN 35; Creatinine, Ser 2.38; Hemoglobin 10.8; Platelets 276.0; Potassium 4.4; Sodium 139; TSH 1.54  Lipid Panel    Component Value Date/Time   CHOL 195 12/26/2018 0947   TRIG 196.0 (H) 12/26/2018 0947   HDL 45.10 12/26/2018 0947   CHOLHDL 4 12/26/2018 0947   VLDL 39.2 12/26/2018 0947   LDLCALC 111 (H) 12/26/2018 0947    Diagnostic Dominance: Right    Wt Readings from Last 3 Encounters:  08/06/20 198 lb 12.8 oz (90.2 kg)  08/05/20 197 lb (89.4 kg)  09/20/19 201 lb 9.6 oz (91.4 kg)      Other studies Reviewed: Additional studies/ records that were reviewed today include: Labs Review of the above records demonstrates:  Please see elsewhere in the note.     ASSESSMENT AND PLAN:   CKD IIIa: Her last creatinine was I was able to review some blood work done yesterday after her fall.  Her creatinine is 2.38 which is not near her baseline which was previously 1.17.  She will have this followed by nephrology.   DYSLIPIDEMIA:     LDL previously was treated previously with Crestor but for some reason it was stopped at this might of been contributing to her renal insufficiency although I would not suspect this.  Regardless I will not increase her Crestor but instead will refer her to our clinic to start on PCSK9 listing statins as a intolerance to higher doses.   CAD:     The patient has no chest pain but has shortness of breath.  This is not highly progressive and she did not have any acute cardiac issues apparently with her nephrectomy.  I did review with her and her daughter-in-law the films from previous.  At this point catheterization and consideration of revascularization would be high risk to her kidney.  We  agreed to continue to try to manage this conservatively.  I am going to start by trying to creep up on her beta-blocker to metoprolol 50 mg in the morning and 20 5 in the evening.  If stable I will go to 50 and 50.  I could consider adding Plavix.  They will let me know if her symptoms worsen and if they do and in particular if her renal function improves we might need to do catheterization again.  They agree with this plan and understand.  Current medicines are reviewed at length with the patient today.  The patient does not have concerns regarding medicines.  The following changes have been made: As above  Labs/ tests ordered today include:    Orders Placed This Encounter  Procedures   AMB Referral to [facility]   EKG 12-Lead      Disposition:   FU with me in 2 months   Signed, Minus Breeding, MD  08/06/2020 1:51 PM    [place]

## 2020-08-05 NOTE — Progress Notes (Signed)
Chief Complaint  Patient presents with   Fall    Subjective: Patient is a 70 y.o. female here for a handful of issues.  She is here with her son, Quita Skye.  A few days ago, the patient was walking and believes she was unable to lift her foot.  She ended up tripping and rolling on some stone.  She landed on her right forearm.  She did not have any vertigo or lightheadedness prior to falling but did feel unsteady.  She has fallen 2 other times in the last month and a half.  1 time she was getting up in the middle of the night and the other time she was trying to sit on the toilet and fell backwards falling on her left shoulder.  She does not exercise routinely.  She drinks around 16 ounces of water daily.  Her urine is a dark tint to it.    She has a solitary kidney.  She had renal cell carcinoma diagnosed earlier in 2022 that was found incidentally on a CTA.  She had a left nephrectomy.  She has a history of chronic kidney disease.  She saw nephrology in [place] but does not have a provider in this area.  She would like to see a urologist and nephrologist if possible.  Patient has a history of coronary artery disease.  She was following with [facility] cardiology who recommended medical management for triple-vessel disease.  Her cardiologist in [place] recommended triple bypass.  Shortly after that recommendation, she had her issues with her kidney cancer.  She has an appointment with the cardiology team tomorrow.  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/10/13   Cancer ([place])    melanoma in 2 sights   Complication of anesthesia    "chills every evening at sundown x 2 weeks"- no fever   Depression    Dysphagia    History of shingles    NO RESIDUAL PROBLEMS   Hypertension    Pain    LOWER BACK PAIN   Sleep apnea    CPAP    Objective: BP 132/80   Pulse 74   Temp 98.3 F (36.8 C) (Oral)   Ht 5\' 6"  (1.676 m)   Wt 197  lb (89.4 kg)   SpO2 96%   BMI 31.80 kg/m  General: Awake, appears stated age Heart: RRR, no lower extremity edema Lungs: CTAB, no rales, wheezes or rhonchi. No accessory muscle use MSK: There is tenderness to palpation over the left CVA in the upper/mid thoracic region without crepitus, deformity, ecchymosis, or erythema.  There is no edema. Neuro: Gait is slow and unsteady.  No signs of orthostasis when rising.  5/5 strength throughout, DTRs equal and symmetric throughout, no clonus Psych: Age appropriate judgment and insight, normal affect and mood  Assessment and Plan: Chronic kidney disease, stage 4 (severe) ([place]) - Plan: Ambulatory referral to Nephrology, Renal Function Panel  Acquired solitary kidney - Plan: Ambulatory referral to Nephrology  Atherosclerotic heart disease of native coronary artery with other forms of angina pectoris ([place]), Chronic  Unsteady gait - Plan: Ambulatory referral to Physical Therapy, CBC  Weakness - Plan: TSH, T4, free  History of renal cell cancer - Plan: Ambulatory referral to Urology  Left-sided thoracic back pain, unspecified chronicity  1/2.  Check labs.  Refer to nephrology. 3.  Continue statin and aspirin.  Appreciate cardiology. 4.  I think  this is the cause of her falling.  Refer to physical therapy.  I think we can increase her strength and balance, her cough is also increased and she will be less of a fall risk.  She does live alone. 5.  We will also check labs for above. 6.  Refer back to urology. The patient voiced understanding and agreement to the plan.  [place], DO 08/05/20  4:51 PM

## 2020-08-05 NOTE — Patient Instructions (Addendum)
Try to drink 55-60 oz of water daily outside of exercise.  Give [place] 2-3 business days to get the results of your labs back.   If you do not hear anything about your referrals in the next 1-2 weeks, call our office and ask for an update.  Keep the diet clean and stay active.  Let us know if you need anything.

## 2020-08-05 NOTE — Telephone Encounter (Signed)
[facility] FYI:  ---Alwyn Ren transferred patient from the office to be triaged. Caller states his mother fell this morning and she has a hematoma on her right fore arm. Caller states mother is fatigued and having trouble walking. Caller states mother is also having dizziness.  Advised to be seen within 4 hours, pt has an appt with another provider

## 2020-08-06 ENCOUNTER — Ambulatory Visit (INDEPENDENT_AMBULATORY_CARE_PROVIDER_SITE_OTHER): Payer: Medicare Other | Admitting: Cardiology

## 2020-08-06 ENCOUNTER — Encounter: Payer: Self-pay | Admitting: Cardiology

## 2020-08-06 VITALS — BP 116/62 | HR 71 | Ht 66.0 in | Wt 198.8 lb

## 2020-08-06 DIAGNOSIS — E785 Hyperlipidemia, unspecified: Secondary | ICD-10-CM

## 2020-08-06 DIAGNOSIS — R002 Palpitations: Secondary | ICD-10-CM | POA: Diagnosis not present

## 2020-08-06 DIAGNOSIS — I2510 Atherosclerotic heart disease of native coronary artery without angina pectoris: Secondary | ICD-10-CM

## 2020-08-06 DIAGNOSIS — N1831 Chronic kidney disease, stage 3a: Secondary | ICD-10-CM | POA: Diagnosis not present

## 2020-08-06 LAB — T4, FREE: Free T4: 0.64 ng/dL (ref 0.60–1.60)

## 2020-08-06 LAB — RENAL FUNCTION PANEL
Albumin: 4.2 g/dL (ref 3.5–5.2)
BUN: 35 mg/dL — ABNORMAL HIGH (ref 6–23)
CO2: 26 mEq/L (ref 19–32)
Calcium: 9.7 mg/dL (ref 8.4–10.5)
Chloride: 103 mEq/L (ref 96–112)
Creatinine, Ser: 2.38 mg/dL — ABNORMAL HIGH (ref 0.40–1.20)
GFR: 20.24 mL/min — ABNORMAL LOW (ref >60.00)
Glucose, Bld: 102 mg/dL — ABNORMAL HIGH (ref 70–99)
Phosphorus: 4 mg/dL (ref 2.3–4.6)
Potassium: 4.4 mEq/L (ref 3.5–5.1)
Sodium: 139 mEq/L (ref 135–145)

## 2020-08-06 LAB — TSH: TSH: 1.54 u[IU]/mL (ref 0.35–4.50)

## 2020-08-06 LAB — CBC
HCT: 32 % — ABNORMAL LOW (ref 36.0–46.0)
Hemoglobin: 10.8 g/dL — ABNORMAL LOW (ref 12.0–15.0)
MCHC: 33.9 g/dL (ref 30.0–36.0)
MCV: 87.2 fl (ref 78.0–100.0)
Platelets: 276 10*3/uL (ref 150.0–400.0)
RBC: 3.67 Mil/uL — ABNORMAL LOW (ref 3.87–5.11)
RDW: 16.2 % — ABNORMAL HIGH (ref 11.5–15.5)
WBC: 7.7 10*3/uL (ref 4.0–10.5)

## 2020-08-06 MED ORDER — METOPROLOL SUCCINATE ER 25 MG PO TB24: ORAL_TABLET | ORAL | 3 refills | Status: DC

## 2020-08-06 NOTE — Patient Instructions (Signed)
Medication Instructions:  INCREASE metoprolol to 50mg  (2 tablets) in the morning and 25mg  (1 tablet) in the evening.   *If you need a refill on your cardiac medications before your next appointment, please call your pharmacy*   Lab Work: None ordered.   If you have labs (blood work) drawn today and your tests are completely normal, you will receive your results only by: [place] (if you have MyChart) OR A paper copy in the mail If you have any lab test that is abnormal or we need to change your treatment, we will call you to review the results.   Testing/Procedures: None ordered.    Follow-Up: At [facility], you and your health needs are our priority.  As part of our continuing mission to provide you with exceptional heart care, we have created designated Provider Care Teams.  These Care Teams include your primary Cardiologist (physician) and Advanced Practice Providers (APPs -  Physician Assistants and Nurse Practitioners) who all work together to provide you with the care you need, when you need it.  We recommend signing up for the patient portal called "MyChart".  Sign up information is provided on this After Visit Summary.  MyChart is used to connect with patients for Virtual Visits (Telemedicine).  Patients are able to view lab/test results, encounter notes, upcoming appointments, etc.  Non-urgent messages can be sent to your provider as well.   To learn more about what you can do with MyChart, go to [email or web address].    Your next appointment:   2 month(s)  The format for your next appointment:   In Person  Provider:   Minus Breeding, MD   Other Instructions See pharmD for lipids.

## 2020-08-07 ENCOUNTER — Telehealth (HOSPITAL_BASED_OUTPATIENT_CLINIC_OR_DEPARTMENT_OTHER): Payer: Self-pay | Admitting: *Deleted

## 2020-08-07 ENCOUNTER — Other Ambulatory Visit: Payer: Medicare Other

## 2020-08-07 ENCOUNTER — Other Ambulatory Visit: Payer: Self-pay | Admitting: Family Medicine

## 2020-08-07 ENCOUNTER — Other Ambulatory Visit (INDEPENDENT_AMBULATORY_CARE_PROVIDER_SITE_OTHER): Payer: Medicare Other

## 2020-08-07 DIAGNOSIS — D72818 Other decreased white blood cell count: Secondary | ICD-10-CM | POA: Diagnosis not present

## 2020-08-07 DIAGNOSIS — D649 Anemia, unspecified: Secondary | ICD-10-CM | POA: Diagnosis not present

## 2020-08-07 DIAGNOSIS — R531 Weakness: Secondary | ICD-10-CM

## 2020-08-07 LAB — IBC + FERRITIN
Ferritin: 44 ng/mL (ref 10.0–291.0)
Iron: 45 ug/dL (ref 42–145)
Saturation Ratios: 11.3 % — ABNORMAL LOW (ref 20.0–50.0)
Transferrin: 284 mg/dL (ref 212.0–360.0)

## 2020-08-07 NOTE — Telephone Encounter (Signed)
Pt has moved back to [place] and wasn't sure when she was due to see Dr Dellis Filbert again.  Due after December 01/2021.  On no hormones. Due in Fall 2022 for mammo had last mammo in [place].   Pt needs assistance on getting MyChart password reset.  Gave patient information on how to reset it.  KW CMA

## 2020-08-10 ENCOUNTER — Telehealth: Payer: Self-pay | Admitting: *Deleted

## 2020-08-10 ENCOUNTER — Encounter: Payer: Self-pay | Admitting: Internal Medicine

## 2020-08-10 ENCOUNTER — Other Ambulatory Visit (INDEPENDENT_AMBULATORY_CARE_PROVIDER_SITE_OTHER): Payer: Medicare Other

## 2020-08-10 ENCOUNTER — Other Ambulatory Visit: Payer: Self-pay

## 2020-08-10 DIAGNOSIS — D72818 Other decreased white blood cell count: Secondary | ICD-10-CM

## 2020-08-10 DIAGNOSIS — R531 Weakness: Secondary | ICD-10-CM | POA: Diagnosis not present

## 2020-08-10 LAB — CBC
HCT: 32.5 % — ABNORMAL LOW (ref 36.0–46.0)
Hemoglobin: 10.9 g/dL — ABNORMAL LOW (ref 12.0–15.0)
MCHC: 33.5 g/dL (ref 30.0–36.0)
MCV: 87.8 fl (ref 78.0–100.0)
Platelets: 226 10*3/uL (ref 150.0–400.0)
RBC: 3.7 Mil/uL — ABNORMAL LOW (ref 3.87–5.11)
RDW: 16.3 % — ABNORMAL HIGH (ref 11.5–15.5)
WBC: 6.5 10*3/uL (ref 4.0–10.5)

## 2020-08-10 LAB — URINALYSIS, MICROSCOPIC ONLY: RBC / HPF: NONE SEEN (ref 0–?)

## 2020-08-10 NOTE — Progress Notes (Signed)
Total Volume:  1753mL Start:  08/09/20 @ 8am End:  08/10/20 @ 8am

## 2020-08-10 NOTE — Telephone Encounter (Signed)
Pt called in about labwork that was done at primary care office. Advised we couldn't advise on these labs since she has had a kidney removed and is in treatment these results could be a direct effect from that and not actually worrisome.  Advised to contact PCP office back. KW CMA

## 2020-08-26 DIAGNOSIS — D631 Anemia in chronic kidney disease: Secondary | ICD-10-CM | POA: Diagnosis not present

## 2020-08-26 DIAGNOSIS — Z905 Acquired absence of kidney: Secondary | ICD-10-CM | POA: Diagnosis not present

## 2020-08-26 DIAGNOSIS — N184 Chronic kidney disease, stage 4 (severe): Secondary | ICD-10-CM | POA: Diagnosis not present

## 2020-08-26 DIAGNOSIS — N179 Acute kidney failure, unspecified: Secondary | ICD-10-CM | POA: Diagnosis not present

## 2020-08-26 DIAGNOSIS — R21 Rash and other nonspecific skin eruption: Secondary | ICD-10-CM | POA: Diagnosis not present

## 2020-08-26 DIAGNOSIS — N1831 Chronic kidney disease, stage 3a: Secondary | ICD-10-CM | POA: Diagnosis not present

## 2020-08-26 DIAGNOSIS — C642 Malignant neoplasm of left kidney, except renal pelvis: Secondary | ICD-10-CM | POA: Diagnosis not present

## 2020-08-26 DIAGNOSIS — I2510 Atherosclerotic heart disease of native coronary artery without angina pectoris: Secondary | ICD-10-CM | POA: Diagnosis not present

## 2020-08-26 DIAGNOSIS — R42 Dizziness and giddiness: Secondary | ICD-10-CM | POA: Diagnosis not present

## 2020-08-26 DIAGNOSIS — N390 Urinary tract infection, site not specified: Secondary | ICD-10-CM | POA: Diagnosis not present

## 2020-08-27 ENCOUNTER — Other Ambulatory Visit: Payer: Self-pay

## 2020-08-27 ENCOUNTER — Ambulatory Visit (INDEPENDENT_AMBULATORY_CARE_PROVIDER_SITE_OTHER): Payer: Medicare Other | Admitting: Internal Medicine

## 2020-08-27 ENCOUNTER — Encounter: Payer: Self-pay | Admitting: Internal Medicine

## 2020-08-27 VITALS — BP 128/84 | HR 72 | Temp 98.3°F | Ht 66.0 in | Wt 192.0 lb

## 2020-08-27 DIAGNOSIS — N1831 Chronic kidney disease, stage 3a: Secondary | ICD-10-CM | POA: Diagnosis not present

## 2020-08-27 DIAGNOSIS — R7303 Prediabetes: Secondary | ICD-10-CM | POA: Diagnosis not present

## 2020-08-27 DIAGNOSIS — I2510 Atherosclerotic heart disease of native coronary artery without angina pectoris: Secondary | ICD-10-CM | POA: Diagnosis not present

## 2020-08-27 DIAGNOSIS — Q998 Other specified chromosome abnormalities: Secondary | ICD-10-CM

## 2020-08-27 DIAGNOSIS — R21 Rash and other nonspecific skin eruption: Secondary | ICD-10-CM | POA: Diagnosis not present

## 2020-08-27 DIAGNOSIS — E705 Disorders of tryptophan metabolism: Secondary | ICD-10-CM | POA: Insufficient documentation

## 2020-08-27 DIAGNOSIS — D539 Nutritional anemia, unspecified: Secondary | ICD-10-CM | POA: Insufficient documentation

## 2020-08-27 LAB — IRON: Iron: 72 ug/dL (ref 42–145)

## 2020-08-27 LAB — HEMOGLOBIN A1C: Hgb A1c MFr Bld: 5.9 % (ref 4.6–6.5)

## 2020-08-27 LAB — VITAMIN B12: Vitamin B-12: 728 pg/mL (ref 211–911)

## 2020-08-27 LAB — FOLATE: Folate: 13.8 ng/mL (ref >5.9)

## 2020-08-27 LAB — FERRITIN: Ferritin: 85.2 ng/mL (ref 10.0–291.0)

## 2020-08-27 NOTE — Patient Instructions (Signed)
[employer] medicine (25th ed., pp. 1059-1068). [place], [place]: Elsevier.">  Anemia  Anemia is a condition in which there is not enough red blood cells or hemoglobin in the blood. Hemoglobin is a substance in red blood cells thatcarries oxygen. When you do not have enough red blood cells or hemoglobin (are anemic), your body cannot get enough oxygen and your organs may not work properly. Asa result, you may feel very tired or have other problems. What are the causes? Common causes of anemia include: Excessive bleeding. Anemia can be caused by excessive bleeding inside or outside the body, including bleeding from the intestines or from heavy menstrual periods in females. Poor nutrition. Long-lasting (chronic) kidney, thyroid, and liver disease. Bone marrow disorders, spleen problems, and blood disorders. Cancer and treatments for cancer. HIV (human immunodeficiency virus) and AIDS (acquired immunodeficiency syndrome). Infections, medicines, and autoimmune disorders that destroy red blood cells. What are the signs or symptoms? Symptoms of this condition include: Minor weakness. Dizziness. Headache, or difficulties concentrating and sleeping. Heartbeats that feel irregular or faster than normal (palpitations). Shortness of breath, especially with exercise. Pale skin, lips, and nails, or cold hands and feet. Indigestion and nausea. Symptoms may occur suddenly or develop slowly. If your anemia is mild, you maynot have symptoms. How is this diagnosed? This condition is diagnosed based on blood tests, your medical history, and a physical exam. In some cases, a test may be needed in which cells are removed from the soft tissue inside of a bone and looked at under a microscope (bone marrow biopsy). Your health care provider may also check your stool (feces) for blood and may do additional testing to look for the cause of yourbleeding. Other tests may include: Imaging tests, such as a CT scan or  MRI. A procedure to see inside your esophagus and stomach (endoscopy). A procedure to see inside your colon and rectum (colonoscopy). How is this treated? Treatment for this condition depends on the cause. If you continue to lose a lot of blood, you may need to be treated at a hospital. Treatment may include: Taking supplements of iron, vitamin B12, or folic acid. Taking a hormone medicine (erythropoietin) that can help to stimulate red blood cell growth. Having a blood transfusion. This may be needed if you lose a lot of blood. Making changes to your diet. Having surgery to remove your spleen. Follow these instructions at home: Take over-the-counter and prescription medicines only as told by your health care provider. Take supplements only as told by your health care provider. Follow any diet instructions that you were given by your health care provider. Keep all follow-up visits as told by your health care provider. This is important. Contact a health care provider if: You develop new bleeding anywhere in the body. Get help right away if: You are very weak. You are short of breath. You have pain in your abdomen or chest. You are dizzy or feel faint. You have trouble concentrating. You have bloody stools, black stools, or tarry stools. You vomit repeatedly or you vomit up blood. These symptoms may represent a serious problem that is an emergency. Do not wait to see if the symptoms will go away. Get medical help right away. Call your local emergency services (911 in the [place] Do not drive yourself to the hospital. Summary Anemia is a condition in which you do not have enough red blood cells or enough of a substance in your red blood cells that carries oxygen (hemoglobin). Symptoms may occur suddenly   or develop slowly. If your anemia is mild, you may not have symptoms. This condition is diagnosed with blood tests, a medical history, and a physical exam. Other tests may be  needed. Treatment for this condition depends on the cause of the anemia. This information is not intended to replace advice given to you by your health care provider. Make sure you discuss any questions you have with your healthcare provider. Document Revised: 01/08/2019 Document Reviewed: 01/08/2019 Elsevier Patient Education  2022 [employer].

## 2020-08-27 NOTE — Progress Notes (Signed)
Subjective:  Patient ID: Kelsey Church, female    DOB: 08-Sep-1950  Age: 70 y.o. MRN: [id number]  CC: Rash  This visit occurred during the SARS-CoV-2 public health emergency.  Safety protocols were in place, including screening questions prior to the visit, additional usage of staff PPE, and extensive cleaning of exam room while observing appropriate contact time as indicated for disinfecting solutions.    HPI Kelsey Church presents for f/up -  She is status post left nephrectomy for clear-cell carcinoma.  She tried to take her Trudo but did not tolerated due to declining renal function.  She has CAD and suffers from chronic shortness of breath but denies any recent episodes of chest pain or diaphoresis.  She complains of chronic dizziness and fatigue.  She also complains of a several month history of red rash on her lower extremities.  This is asymptomatic.  She tells me she has an upcoming appointment with dermatology.  She is followed by nephrology, cardiology, and urology.  Outpatient Medications Prior to Visit  Medication Sig Dispense Refill   acetaminophen (TYLENOL) 650 MG CR tablet Take 1,300 mg by mouth every 8 (eight) hours as needed for pain.      aspirin EC 81 MG tablet Take 1 tablet (81 mg total) by mouth daily. 90 tablet 3   cetirizine (ZYRTEC) 10 MG tablet Take 10 mg by mouth daily as needed for allergies.     citalopram (CELEXA) 20 MG tablet TAKE 1 TABLET AT BEDTIME 90 tablet 1   gabapentin (NEURONTIN) 300 MG capsule Take 300 mg by mouth 1 day or 1 dose.     metoprolol succinate (TOPROL-XL) 25 MG 24 hr tablet Take 2 tablets (50 mg total) by mouth in the morning AND 1 tablet (25 mg total) every evening. 270 tablet 3   ranolazine (RANEXA) 500 MG 12 hr tablet Take 1 tablet by mouth 2 (two) times daily.     rosuvastatin (CRESTOR) 5 MG tablet Take 5 mg by mouth daily.     nitroGLYCERIN (NITROSTAT) 0.4 MG SL tablet Place 1 tablet (0.4 mg total) under the tongue every 5 (five)  minutes as needed for chest pain. 25 tablet 3   albuterol (VENTOLIN HFA) 108 (90 Base) MCG/ACT inhaler albuterol sulfate HFA 90 mcg/actuation aerosol inhaler     No facility-administered medications prior to visit.    ROS Review of Systems  Constitutional:  Positive for fatigue. Negative for chills, diaphoresis and unexpected weight change.  HENT: Negative.  Negative for sore throat.   Eyes: Negative.   Respiratory:  Positive for shortness of breath. Negative for cough, chest tightness and wheezing.   Cardiovascular:  Negative for chest pain, palpitations and leg swelling.  Gastrointestinal:  Negative for abdominal pain, constipation, diarrhea, nausea and vomiting.  Endocrine: Negative.   Genitourinary: Negative.  Negative for difficulty urinating.  Musculoskeletal: Negative.  Negative for myalgias.  Skin:  Positive for rash.  Neurological:  Positive for dizziness. Negative for weakness and numbness.  Hematological:  Negative for adenopathy. Does not bruise/bleed easily.  Psychiatric/Behavioral: Negative.     Objective:  BP 128/84 (BP Location: Left Arm, Patient Position: Sitting, Cuff Size: Large)   Pulse 72   Temp 98.3 F (36.8 C) (Oral)   Ht 5\' 6"  (1.676 m)   Wt 192 lb (87.1 kg)   SpO2 96%   BMI 30.99 kg/m   BP Readings from Last 3 Encounters:  09/03/20 110/70  08/27/20 128/84  08/06/20 116/62  Wt Readings from Last 3 Encounters:  09/03/20 191 lb 12.8 oz (87 kg)  08/27/20 192 lb (87.1 kg)  08/06/20 198 lb 12.8 oz (90.2 kg)    Physical Exam Vitals reviewed.  Eyes:     General: No scleral icterus.    Conjunctiva/sclera: Conjunctivae normal.  Cardiovascular:     Rate and Rhythm: Normal rate and regular rhythm.     Heart sounds: No murmur heard. Pulmonary:     Effort: Pulmonary effort is normal.     Breath sounds: No stridor. No wheezing, rhonchi or rales.  Abdominal:     General: Abdomen is flat. Bowel sounds are normal. There is no distension.      Palpations: Abdomen is soft. There is no hepatomegaly, splenomegaly or mass.  Musculoskeletal:        General: Normal range of motion.     Cervical back: Neck supple.     Right lower leg: No edema.     Left lower leg: No edema.  Lymphadenopathy:     Cervical: No cervical adenopathy.  Skin:    Findings: Erythema and rash present.     Comments: There are too numerous to count blanching erythematous macules over both lower extremities.  See photo.  They range in size from several millimeters to close to a centimeter.  Neurological:     General: No focal deficit present.  Psychiatric:        Mood and Affect: Mood normal.        Behavior: Behavior normal.    Lab Results  Component Value Date   WBC 6.5 08/10/2020   HGB 10.9 (L) 08/10/2020   HCT 32.5 (L) 08/10/2020   PLT 226.0 08/10/2020   GLUCOSE 102 (H) 08/05/2020   CHOL 112 04/13/2020   TRIG 90 04/13/2020   HDL 59 04/13/2020   LDLCALC 42 04/13/2020   ALT 22 12/26/2018   AST 17 12/26/2018   NA 139 08/05/2020   K 4.4 08/05/2020   CL 103 08/05/2020   CREATININE 2.38 (H) 08/05/2020   BUN 35 (H) 08/05/2020   CO2 26 08/05/2020   TSH 1.54 08/05/2020   INR 1.17 12/27/2013   HGBA1C 5.9 08/27/2020    No results found.  Assessment & Plan:   Kelsey Church was seen today for rash.  Diagnoses and all orders for this visit:  Rash and nonspecific skin eruption- If she does not see a dermatologist soon I recommended she come back to have 1 of these lesions biopsied.  In the meantime I will screen for secondary causes. -     Zinc; Future -     Zinc -     Ambulatory referral to Dermatology  Deficiency anemia- Will screen for vitamin deficiencies. -     Iron; Future -     Vitamin B12; Future -     Vitamin B1; Future -     Zinc; Future -     Folate; Future -     Ferritin; Future -     Ferritin -     Folate -     Zinc -     Vitamin B1 -     Vitamin B12 -     Iron  Prediabetes- Her A1c is normal. -     Hemoglobin A1c; Future -      Hemoglobin A1c  Stage 3a chronic kidney disease ([place])- Her renal function is stable.  Pellagra-like syndrome  I have discontinued Kelsey H. Justice "Sue"'s albuterol. I am also having her maintain  her acetaminophen, gabapentin, aspirin EC, nitroGLYCERIN, cetirizine, citalopram, rosuvastatin, ranolazine, and metoprolol succinate.  No orders of the defined types were placed in this encounter.    Follow-up: Return in about 3 months (around 11/27/2020).  [name], MD

## 2020-09-01 ENCOUNTER — Ambulatory Visit: Payer: Medicare Other | Attending: Family Medicine | Admitting: Physical Therapy

## 2020-09-01 ENCOUNTER — Encounter: Payer: Self-pay | Admitting: Physical Therapy

## 2020-09-01 ENCOUNTER — Other Ambulatory Visit: Payer: Self-pay

## 2020-09-01 DIAGNOSIS — M6281 Muscle weakness (generalized): Secondary | ICD-10-CM

## 2020-09-01 DIAGNOSIS — R2689 Other abnormalities of gait and mobility: Secondary | ICD-10-CM | POA: Insufficient documentation

## 2020-09-01 DIAGNOSIS — R296 Repeated falls: Secondary | ICD-10-CM | POA: Diagnosis not present

## 2020-09-01 DIAGNOSIS — R2681 Unsteadiness on feet: Secondary | ICD-10-CM | POA: Diagnosis not present

## 2020-09-01 DIAGNOSIS — M545 Low back pain, unspecified: Secondary | ICD-10-CM | POA: Insufficient documentation

## 2020-09-01 NOTE — Therapy (Signed)
[facility] [place], [place], [address] Phone: [phone]   Fax:  [phone]  Physical Therapy Evaluation  Patient Details  Name: Kelsey Church MRN: [id number] Date of Birth: [birth year] Referring Provider (PT): Riki Sheer, [place]   Encounter Date: 09/01/2020   PT End of Session - 09/01/20 1106     Visit Number 1    Number of Visits 13    Date for PT Re-Evaluation 10/13/20    Authorization Type Medicare & BCBS    PT Start Time 1018    PT Stop Time 1057    PT Time Calculation (min) 39 min    Activity Tolerance Patient tolerated treatment well    Behavior During Therapy WFL for tasks assessed/performed             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/10/13   Cancer ([place])    melanoma in 2 sights   Complication of anesthesia    "chills every evening at sundown x 2 weeks"- no fever   Depression    Dysphagia    History of shingles    NO RESIDUAL PROBLEMS   Hypertension    Pain    LOWER BACK PAIN   Sleep apnea    CPAP    Past Surgical History:  Procedure Laterality Date   APPENDECTOMY  1958   COLONOSCOPY     COLONOSCOPY W/ POLYPECTOMY     7 polyps   FOOT SURGERY Right    removal plantar wart   GALLBLADDER SURGERY  02/14/1990   INJECTION KNEE  09/2011   right    KNEE ARTHROSCOPY  1996   LEFT HEART CATH AND CORONARY ANGIOGRAPHY N/A 07/11/2019   Procedure: LEFT HEART CATH AND CORONARY ANGIOGRAPHY;  Surgeon: Belva Crome, MD;  Location: [facility];  Service: Cardiovascular;  Laterality: N/A;   MELANOMA EXCISION  1992   right chin, back   POLYPECTOMY     SALPINGOOPHORECTOMY  2009   Right   TOTAL KNEE ARTHROPLASTY Right 06/10/2013   Procedure: RIGHT TOTAL KNEE ARTHROPLASTY;  Surgeon: [name], MD;  Location: [facility];  Service: Orthopedics;  Laterality: Right;   TOTAL KNEE ARTHROPLASTY  Left 12/16/2013   Procedure: LEFT TOTAL KNEE ARTHROPLASTY;  Surgeon: [name], MD;  Location: [facility];  Service: Orthopedics;  Laterality: Left;    There were no vitals filed for this visit.    Subjective Assessment - 09/01/20 1020     Subjective Patient reports that for the past couple of months things she has had some trouble with her balance. Feels that this is particularly due to her footwear as they never seem to fit right. Reports 3 falls in the past 6 months. Has also had some medical issues- Had some imaging done for her heart in May and incidentally a 7cm tumor was found on her L kidney- the kidney was then removed. After the surgery she got up in the middle of the night and hit the back of her L shoulder on the back of the commode. Notes that she was unable to get herself off the floor after her last fall. Also reports trouble with her medications and dehydration while being treated for this CA. Reports that during 2/3 of her falls her got dizzy when she was stood up. However, denies any dizziness episodes since. Denies AD  use. Reports imbalance and SOB with walking- feels like she wavers off path and has trouble keeping a good pace. Also been seeing a chiropractor for her B LBP which has been present for the past few weeks. Worse with standing/walking, better with sitting, Denies B&B changes, radiation, or N/T.    Pertinent History B TKA 2015, melanoma, dysphagia, GERD, HTN, LBP, renal cell carcinoma with L nephrectomy 2022    Limitations Lifting;Standing;Walking;House hold activities    How long can you walk comfortably? 30 min shopping trip if using a buggy- limited by fatigue and LBP    Diagnostic tests none recent    Patient Stated Goals improve balance    Currently in Pain? Yes    Pain Score 0-No pain    Pain Location Back    Pain Orientation Left;Lower    Pain Descriptors / Indicators Spasm    Pain Type Acute pain                [facility] PT Assessment - 09/01/20 1031        Assessment   Medical Diagnosis Unsteadiness of gait    Referring Provider (PT) Riki Sheer, DO    Onset Date/Surgical Date 06/14/20    Next MD Visit not scheduled    Prior Therapy yes- s/p TKA      Precautions   Precautions Fall      Balance Screen   Has the patient fallen in the past 6 months Yes    How many times? 3    Has the patient had a decrease in activity level because of a fear of falling?  Yes    Is the patient reluctant to leave their home because of a fear of falling?  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, nutritional   Overall Cognitive Status Within Functional Limits for tasks assessed      Observation/Other Assessments   Observations red rash over B anterior thighs and shins   pt reports that she has been seen by 2 MDs for this     Sensation   Light Touch Appears Intact      Coordination   Gross Motor Movements are Fluid and Coordinated Yes      Posture/Postural Control   Posture/Postural Control Postural limitations    Postural Limitations Rounded Shoulders;Forward head;Increased thoracic kyphosis      ROM / Strength   AROM / PROM / Strength Strength;AROM      AROM   AROM Assessment Site Ankle    Right/Left Ankle Right;Left    Right Ankle Dorsiflexion 0    Left Ankle Dorsiflexion -5      Strength   Strength Assessment Site Hip;Knee;Ankle    Right/Left Hip Right;Left    Right Hip Flexion 4/5    Right Hip ABduction 4-/5    Right Hip ADduction 4-/5    Left Hip Flexion 4-/5   pain in L LB   Left Hip ABduction 4-/5    Left Hip ADduction 4-/5    Right/Left Knee Right;Left  Right Knee Flexion 4/5     Right Knee Extension 4+/5    Left Knee Flexion 4+/5    Left Knee Extension 4+/5    Right/Left Ankle Right;Left    Right Ankle Dorsiflexion 4/5    Right Ankle Plantar Flexion 4/5    Left Ankle Dorsiflexion 4/5    Left Ankle Plantar Flexion 4/5      Ambulation/Gait   Assistive device None    Gait Pattern Step-to pattern;Step-through pattern;Decreased step length - right;Decreased step length - left;Trunk flexed;Poor foot clearance - left;Poor foot clearance - right   diminished B heel-toe pattern   Ambulation Surface Level;Indoor    Gait velocity decreased      Standardized Balance Assessment   Standardized Balance Assessment Timed Up and Go Test;Five Times Sit to Stand    Five times sit to stand comments  18.13   without UE support     Timed Up and Go Test   Normal TUG (seconds) 9.73   no AD                       Objective measurements completed on examination: See above findings.               PT Education - 09/01/20 1106     Education Details prognosis, POC, HEP; edu on wearing proper/safe footwear to avoid falls    Person(s) Educated Patient    Methods Explanation;Demonstration;Tactile cues;Verbal cues;Handout    Comprehension Verbalized understanding              PT Short Term Goals - 09/01/20 1202       PT SHORT TERM GOAL #1   Title Patient to demonstrate independence with initial HEP.    Time 3    Period Weeks    Status New    Target Date 09/22/20               PT Long Term Goals - 09/01/20 1202       PT LONG TERM GOAL #1   Title Patient to be independent with advanced HEP.    Time 6    Period Weeks    Status New    Target Date 10/13/20      PT LONG TERM GOAL #2   Title Patient to demonstrate B LE strength >/=4+/5.    Time 6    Period Weeks    Status New    Target Date 10/13/20      PT LONG TERM GOAL #3   Title Patient to demonstrate atleast 10 degrees of B ankle AROM.    Time 6    Period Weeks    Status New     Target Date 10/13/20      PT LONG TERM GOAL #4   Title Patient to demonstrate safe floor transfer with mod I.    Time 6    Period Weeks    Status New    Target Date 10/13/20      PT LONG TERM GOAL #5   Title Patient to score >19/24 on DGI in order to decrease fall risk.    Time 6    Period Weeks    Status New    Target Date 10/13/20      Additional Long Term Goals   Additional Long Term Goals Yes      PT LONG TERM GOAL #6   Title Patient to return to exercise class or fitness regimen for improved endurance.  Time 6    Period Weeks    Status New    Target Date 10/13/20                    Plan - 09/01/20 1159     Clinical Impression Statement Patient is a 70 y/o F presenting to O[facility] with c/o imbalance and falls for the past couple months after being diagnosed with L renal cell carcinoma and undergoing L nephrectomy earlier this year. Patient reports 3 falls in the past 6 months. She reports trouble with path deviations and limited gait speed. Also mentions difficulty with floor transfers as she was unable to get herself off the floor after her last fall. Patient notes a few weeks of B LBP which she has been seeing a chiropractor for. Pain is worse in standing/walking. Denies B&B changes, radiation, or N/T. Patient today presenting with rounded and forward head posture, decreased B ankle AROM, decreased B hip and ankle strength, and gait deviations. Patient's score on 5xSTS indicates an increased risk of falls. Patient was educated on gentle stretching and strengthening HEP- patient reported understanding. Would benefit from skilled PT services 2x/week for 6 weeks to address aforementioned impairments.    Personal Factors and Comorbidities Age;Comorbidity 3+;Fitness;Past/Current Experience;Time since onset of injury/illness/exacerbation    Comorbidities B TKA 2015, melanoma, dysphagia, GERD, HTN, LBP, renal cell carcinoma with L nephrectomy 2022    Examination-Activity  Limitations Bathing;Squat;Stairs;Bend;Stand;Carry;Toileting;Transfers;Dressing;Hygiene/Grooming;Lift;Locomotion Level;Reach Overhead    Examination-Participation Restrictions Church;Cleaning;Shop;Community Activity;Laundry;Meal Prep    Stability/Clinical Decision Making Stable/Uncomplicated    Clinical Decision Making Low    Rehab Potential Good    PT Frequency 2x / week    PT Duration 6 weeks    PT Treatment/Interventions ADLs/Self Care Home Management;Canalith Repostioning;Cryotherapy;Electrical Stimulation;Moist Heat;Balance training;Therapeutic exercise;Therapeutic activities;Functional mobility training;Stair training;Gait training;Ultrasound;Neuromuscular re-education;DME Instruction;Patient/family education;Manual techniques;Vestibular;Taping;Energy conservation;Dry needling;Passive range of motion    PT Next Visit Plan reassess HEP; DGI, progress balance and LE strengthening    Consulted and Agree with Plan of Care Patient             Patient will benefit from skilled therapeutic intervention in order to improve the following deficits and impairments:  Decreased endurance, Abnormal gait, Hypomobility, Decreased activity tolerance, Decreased strength, Increased fascial restricitons, Pain, Decreased balance, Difficulty walking, Improper body mechanics, Dizziness, Decreased range of motion, Impaired flexibility, Postural dysfunction  Visit Diagnosis: Unsteadiness on feet  Repeated falls  Muscle weakness (generalized)  Other abnormalities of gait and mobility  Acute bilateral low back pain without sciatica     Problem List Patient Active Problem List   Diagnosis Date Noted   Rash and nonspecific skin eruption 08/27/2020   Deficiency anemia 08/27/2020   Dyslipidemia 09/19/2019   Atherosclerotic heart disease of native coronary artery with other forms of angina pectoris ([place])    Educated about COVID-19 virus infection 03/14/2019   Tinea incognito 12/26/2018   Symptomatic  PVCs 12/26/2018   Stage 3a chronic kidney disease ([place]) 12/26/2018   Degenerative lumbar disc 11/01/2016   Gastroesophageal reflux disease with esophagitis 12/10/2014   Moderate osteopenia 12/10/2014   Vitamin B12 deficiency 12/10/2014   OA (osteoarthritis) of knee 12/16/2013   Prediabetes 12/03/2013   Other screening mammogram 09/25/2012   DJD (degenerative joint disease) of knee 09/09/2011   OSA (obstructive sleep apnea) 07/29/2011   Essential hypertension, benign 07/29/2011   Pure hypercholesterolemia 07/29/2011   Routine general medical examination at a health care facility 07/29/2011      Janene Harvey, PT, DPT 09/01/20  12:08 PM   [facility] [address]  [place], [place], [address] Phone: [phone]   Fax:  [phone]  Name: Kelsey Church MRN: [id number] Date of Birth: 04/29/1950

## 2020-09-03 ENCOUNTER — Other Ambulatory Visit: Payer: Self-pay

## 2020-09-03 ENCOUNTER — Encounter: Payer: Self-pay | Admitting: Internal Medicine

## 2020-09-03 ENCOUNTER — Ambulatory Visit (INDEPENDENT_AMBULATORY_CARE_PROVIDER_SITE_OTHER): Payer: Medicare Other | Admitting: Pharmacist Clinician (PhC)/ Clinical Pharmacy Specialist

## 2020-09-03 DIAGNOSIS — E7800 Pure hypercholesterolemia, unspecified: Secondary | ICD-10-CM | POA: Diagnosis not present

## 2020-09-03 NOTE — Patient Instructions (Addendum)
Your Results:             Your most recent labs Goal  Total Cholesterol 142 < 200  Triglycerides 221 < 150  HDL (happy/good cholesterol) 40 > 40  LDL (lousy/bad cholesterol 71 < 70    Medication changes:  Talk to your nephrologist about using Repatha or Praluent.  When you make a decision, please reach out to me or [place] at [phone].  Thank you for choosing [facility]

## 2020-09-03 NOTE — Progress Notes (Signed)
09/03/2020 Kelsey Church 1950-07-04 [id number]   HPI:  Kelsey Church is a 70 y.o. female patient of Dr Percival Spanish, who presents today for a lipid clinic evaluation.  See pertinent past medical history below.  She was on rosuvastatin 40 mg, however recently had episode of AKI, which nephrology suspected could be due to medication and had the dose reduced to 5 mg (also on Keytruda and pembrolizumab).    Today she is in the office with her daughter in law.  Her most recent lipid panel is from May, about a month or so after decreasing the rosuvastatin dose.     Past Medical History: ASCVD Diffuse LAD disease up to 70%, diffuse RCA disease up to 60-70%, distal RCA at 80%; circumflex second obtuse marginal at 99%  hypertension Controlled on metoprolol succ 50 mg  OSA On CPAP  AKI SCr peaked at 4.7 - nephrology believes to be due to rosuvastatin, do not go higher than 5 mg dose   Current Medications: rosuvastatin 5 mg  Cholesterol Goals: LDL < 70   Intolerant/previously tried: atorvastatin, rosuvastatin high dose  Family history: father with multiple MI, died at 21, first in early 53's; paternal aunts/uncles with w/ multiple MI, as well as paternal cousins; mother had rheumatic fever, hypertension, died at 56; sister with hypertension, diabetes; 1 son healthy  Diet: mix of home/eating out - sit down restaurants, no fried foods, not big on pasta, some bread; mix of meats, admits doesn't eat enough veggies (frozen and canned)  Exercise:  no regular exercise, walks dog  (Labradoodle), but gets SOB (worse since having covid last year)  Labs: 5/22: TC 142, TG 221, HDL 40, LDL 71 (on rosuvastatin 5 mg) 2/22: TC 112, TG 90, HDL 59, LDL 42 (on rosuvastatin 40 mg)  Current Outpatient Medications  Medication Sig Dispense Refill   acetaminophen (TYLENOL) 650 MG CR tablet Take 1,300 mg by mouth every 8 (eight) hours as needed for pain.      aspirin EC 81 MG tablet Take 1 tablet (81 mg total) by mouth  daily. 90 tablet 3   cetirizine (ZYRTEC) 10 MG tablet Take 10 mg by mouth daily as needed for allergies.     citalopram (CELEXA) 20 MG tablet TAKE 1 TABLET AT BEDTIME 90 tablet 1   gabapentin (NEURONTIN) 300 MG capsule Take 300 mg by mouth 1 day or 1 dose.     metoprolol succinate (TOPROL-XL) 25 MG 24 hr tablet Take 2 tablets (50 mg total) by mouth in the morning AND 1 tablet (25 mg total) every evening. 270 tablet 3   nitroGLYCERIN (NITROSTAT) 0.4 MG SL tablet Place 1 tablet (0.4 mg total) under the tongue every 5 (five) minutes as needed for chest pain. 25 tablet 3   ranolazine (RANEXA) 500 MG 12 hr tablet Take 1 tablet by mouth 2 (two) times daily.     rosuvastatin (CRESTOR) 5 MG tablet Take 5 mg by mouth daily.     No current facility-administered medications for this visit.    Allergies  Allergen Reactions   Atorvastatin    Rosuvastatin     Creatinine elevation    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/10/13   Cancer ([place])    melanoma in 2 sights   Complication of anesthesia    "chills every evening at sundown x 2 weeks"- no fever   Depression    Dysphagia  History of shingles    NO RESIDUAL PROBLEMS   Hypertension    Pain    LOWER BACK PAIN   Sleep apnea    CPAP    Blood pressure 110/70, pulse 70, resp. rate 15, height 5\' 6"  (1.676 m), weight 191 lb 12.8 oz (87 kg), SpO2 94 %.   Pure hypercholesterolemia Patient with ASCVD and hyperlipidemia, LDL currently at 71, just month after decreasing rosuvastatin dose due to AKI.  Now limited to maximum dose of 5 mg daily.  Reviewed options for lowering LDL cholesterol, including ezetimibe, PCSK-9 inhibitors, bempedoic acid and inclisiran.  Discussed mechanisms of action, dosing, side effects and potential decreases in LDL cholesterol.  Answered all patient questions.  Based on this information, patient would prefer to start PCSK-9 inhibitor.  However she  would be more comfortable reviewing this information with her nephrologist before starting.  She was given contact number to our office and can let us know when she gets that approval.     Tommy Medal PharmD CPP [facility] [address] [place], [place] [address] [phone]

## 2020-09-03 NOTE — Assessment & Plan Note (Signed)
Patient with ASCVD and hyperlipidemia, LDL currently at 71, just month after decreasing rosuvastatin dose due to AKI.  Now limited to maximum dose of 5 mg daily.  Reviewed options for lowering LDL cholesterol, including ezetimibe, PCSK-9 inhibitors, bempedoic acid and inclisiran.  Discussed mechanisms of action, dosing, side effects and potential decreases in LDL cholesterol.  Answered all patient questions.  Based on this information, patient would prefer to start PCSK-9 inhibitor.  However she would be more comfortable reviewing this information with her nephrologist before starting.  She was given contact number to our office and can let us know when she gets that approval.

## 2020-09-07 ENCOUNTER — Encounter: Payer: Self-pay | Admitting: Physical Therapy

## 2020-09-07 ENCOUNTER — Other Ambulatory Visit: Payer: Self-pay

## 2020-09-07 ENCOUNTER — Ambulatory Visit: Payer: Medicare Other | Admitting: Physical Therapy

## 2020-09-07 DIAGNOSIS — R2681 Unsteadiness on feet: Secondary | ICD-10-CM | POA: Diagnosis not present

## 2020-09-07 DIAGNOSIS — M545 Low back pain, unspecified: Secondary | ICD-10-CM | POA: Diagnosis not present

## 2020-09-07 DIAGNOSIS — M6281 Muscle weakness (generalized): Secondary | ICD-10-CM

## 2020-09-07 DIAGNOSIS — R2689 Other abnormalities of gait and mobility: Secondary | ICD-10-CM | POA: Diagnosis not present

## 2020-09-07 DIAGNOSIS — R296 Repeated falls: Secondary | ICD-10-CM | POA: Diagnosis not present

## 2020-09-07 NOTE — Therapy (Signed)
[facility] [place], [place], [address] Phone: [phone]   Fax:  [phone]  Physical Therapy Treatment  Patient Details  Name: Kelsey Church MRN: [id number] Date of Birth: Jul 29, 1950 Referring Provider (PT): Riki Sheer, DO   Encounter Date: 09/07/2020   PT End of Session - 09/07/20 1058     Visit Number 2    Number of Visits 13    Date for PT Re-Evaluation 10/13/20    Authorization Type Medicare & BCBS    Progress Note Due on Visit 10    PT Start Time 1015    PT Stop Time 1055    PT Time Calculation (min) 40 min    Activity Tolerance Patient tolerated treatment well    Behavior During Therapy WFL for tasks assessed/performed             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/10/13   Cancer ([place])    melanoma in 2 sights   Complication of anesthesia    "chills every evening at sundown x 2 weeks"- no fever   Depression    Dysphagia    History of shingles    NO RESIDUAL PROBLEMS   Hypertension    Pain    LOWER BACK PAIN   Sleep apnea    CPAP    Past Surgical History:  Procedure Laterality Date   APPENDECTOMY  1958   COLONOSCOPY     COLONOSCOPY W/ POLYPECTOMY     7 polyps   FOOT SURGERY Right    removal plantar wart   GALLBLADDER SURGERY  02/14/1990   INJECTION KNEE  09/2011   right    KNEE ARTHROSCOPY  1996   LEFT HEART CATH AND CORONARY ANGIOGRAPHY N/A 07/11/2019   Procedure: LEFT HEART CATH AND CORONARY ANGIOGRAPHY;  Surgeon: Belva Crome, MD;  Location: [facility];  Service: Cardiovascular;  Laterality: N/A;   MELANOMA EXCISION  1992   right chin, back   POLYPECTOMY     SALPINGOOPHORECTOMY  2009   Right   TOTAL KNEE ARTHROPLASTY Right 06/10/2013   Procedure: RIGHT TOTAL KNEE ARTHROPLASTY;  Surgeon: [name], MD;  Location: [facility];  Service: Orthopedics;  Laterality:  Right;   TOTAL KNEE ARTHROPLASTY Left 12/16/2013   Procedure: LEFT TOTAL KNEE ARTHROPLASTY;  Surgeon: [name], MD;  Location: [facility];  Service: Orthopedics;  Laterality: Left;    There were no vitals filed for this visit.   Subjective Assessment - 09/07/20 1020     Subjective Patient reports she started on a new medication prescribed by nephrologist, sodium bicarb but does not recall dosage.  Otherwise no changes other than allergies and congestion.  No new falls, no pain.    Pertinent History B TKA 2015, melanoma, dysphagia, GERD, HTN, LBP, renal cell carcinoma with L nephrectomy 2022    Limitations Lifting;Standing;Walking;House hold activities    How long can you walk comfortably? 30 min shopping trip if using a buggy- limited by fatigue and LBP    Diagnostic tests none recent    Patient Stated Goals improve balance    Currently in Pain? No/denies                               [facility] Adult PT Treatment/Exercise - 09/07/20 0001  Balance   Balance Assessed Yes      Standardized Balance Assessment   Standardized Balance Assessment Dynamic Gait Index      Dynamic Gait Index   Level Surface Mild Impairment    Change in Gait Speed Normal    Gait with Horizontal Head Turns Normal    Gait with Vertical Head Turns Normal    Gait and Pivot Turn Mild Impairment    Step Over Obstacle Mild Impairment    Step Around Obstacles Mild Impairment    Steps Mild Impairment    Total Score 19      Exercises   Exercises Knee/Hip      Knee/Hip Exercises: Aerobic   Nustep L5 x 6 min      Knee/Hip Exercises: Standing   Heel Raises 20 reps    Hip Abduction Stengthening;4 sets    Abduction Limitations 5 reps, smaller sets needed due to L knee pain.    Hip Extension Stengthening;4 sets    Extension Limitations 5 reps, smaller sets needed due to L knee pain    Functional Squat 20 reps    Functional Squat Limitations at counter, cues and demo      Knee/Hip  Exercises: Seated   Sit to Sand 10 reps;without UE support   demo "nose over toes" for foward weight shift     Knee/Hip Exercises: Supine   Other Supine Knee/Hip Exercises posterior pelvic tilts, 2 x 10 with 5 sec hold, demo and VC                    PT Education - 09/07/20 1058     Education Details progressed HEP, Access Code: [email or web address]    Person(s) Educated Patient    Methods Explanation;Demonstration;Verbal cues;Handout    Comprehension Verbalized understanding              PT Short Term Goals - 09/01/20 1202       PT SHORT TERM GOAL #1   Title Patient to demonstrate independence with initial HEP.    Time 3    Period Weeks    Status New    Target Date 09/22/20               PT Long Term Goals - 09/01/20 1202       PT LONG TERM GOAL #1   Title Patient to be independent with advanced HEP.    Time 6    Period Weeks    Status New    Target Date 10/13/20      PT LONG TERM GOAL #2   Title Patient to demonstrate B LE strength >/=4+/5.    Time 6    Period Weeks    Status New    Target Date 10/13/20      PT LONG TERM GOAL #3   Title Patient to demonstrate atleast 10 degrees of B ankle AROM.    Time 6    Period Weeks    Status New    Target Date 10/13/20      PT LONG TERM GOAL #4   Title Patient to demonstrate safe floor transfer with mod I.    Time 6    Period Weeks    Status New    Target Date 10/13/20      PT LONG TERM GOAL #5   Title Patient to score >19/24 on DGI in order to decrease fall risk.    Time 6    Period Weeks    Status New  Target Date 10/13/20      Additional Long Term Goals   Additional Long Term Goals Yes      PT LONG TERM GOAL #6   Title Patient to return to exercise class or fitness regimen for improved endurance.    Time 6    Period Weeks    Status New    Target Date 10/13/20                   Plan - 09/07/20 1024     Clinical Impression Statement Pt. participated in exercises focusing on  LE/Hip strengthening to improve endurance and decrease risk of falls.  HEP was progressed today.  Also introduced neutral spine exercise focusing on posterior pelvic tilt as she has decreased core strength secondary to abdominal surgery.  Her DGI today was 19/24 which indicates higher fall risk.  She will benefit from skilled physical therapy for LE strenghtening and balance to decrease risk of falls.    Personal Factors and Comorbidities Age;Comorbidity 3+;Fitness;Past/Current Experience;Time since onset of injury/illness/exacerbation    Comorbidities B TKA 2015, melanoma, dysphagia, GERD, HTN, LBP, renal cell carcinoma with L nephrectomy 2022    Examination-Activity Limitations Bathing;Squat;Stairs;Bend;Stand;Carry;Toileting;Transfers;Dressing;Hygiene/Grooming;Lift;Locomotion Level;Reach Overhead    Examination-Participation Restrictions Church;Cleaning;Shop;Community Activity;Laundry;Meal Prep    Stability/Clinical Decision Making Stable/Uncomplicated    Rehab Potential Good    PT Frequency 2x / week    PT Duration 6 weeks    PT Treatment/Interventions ADLs/Self Care Home Management;Canalith Repostioning;Cryotherapy;Electrical Stimulation;Moist Heat;Balance training;Therapeutic exercise;Therapeutic activities;Functional mobility training;Stair training;Gait training;Ultrasound;Neuromuscular re-education;DME Instruction;Patient/family education;Manual techniques;Vestibular;Taping;Energy conservation;Dry needling;Passive range of motion    PT Next Visit Plan reassess HEP; DGI, progress balance and LE strengthening    PT Home Exercise Plan Access Code: [email or web address]    Consulted and Agree with Plan of Care Patient             Patient will benefit from skilled therapeutic intervention in order to improve the following deficits and impairments:  Decreased endurance, Abnormal gait, Hypomobility, Decreased activity tolerance, Decreased strength, Increased fascial restricitons, Pain, Decreased balance,  Difficulty walking, Improper body mechanics, Dizziness, Decreased range of motion, Impaired flexibility, Postural dysfunction  Visit Diagnosis: Unsteadiness on feet  Repeated falls  Muscle weakness (generalized)  Other abnormalities of gait and mobility  Acute bilateral low back pain without sciatica     Problem List Patient Active Problem List   Diagnosis Date Noted   Rash and nonspecific skin eruption 08/27/2020   Deficiency anemia 08/27/2020   Dyslipidemia 09/19/2019   Atherosclerotic heart disease of native coronary artery with other forms of angina pectoris ([place])    Educated about COVID-19 virus infection 03/14/2019   Tinea incognito 12/26/2018   Symptomatic PVCs 12/26/2018   Stage 3a chronic kidney disease ([place]) 12/26/2018   Degenerative lumbar disc 11/01/2016   Gastroesophageal reflux disease with esophagitis 12/10/2014   Moderate osteopenia 12/10/2014   Vitamin B12 deficiency 12/10/2014   OA (osteoarthritis) of knee 12/16/2013   Prediabetes 12/03/2013   Other screening mammogram 09/25/2012   DJD (degenerative joint disease) of knee 09/09/2011   OSA (obstructive sleep apnea) 07/29/2011   Essential hypertension, benign 07/29/2011   Pure hypercholesterolemia 07/29/2011   Routine general medical examination at a health care facility 07/29/2011    Rennie Natter PT, DPT 09/07/2020, 12:00 PM  [facility] [address]  [place], [place], [address] Phone: [phone]   Fax:  [phone]  Name: LESSLIE MOSSA MRN: [id number] Date of Birth: [birth year]

## 2020-09-07 NOTE — Patient Instructions (Signed)
Access Code: [email or web address] URL: [email or web address] Date: 09/07/2020 Prepared by: Glenetta Hew  Exercises Standing Hip Abduction with Counter Support - 1 x daily - 7 x weekly - 4 sets - 5 reps Standing Hip Extension with Counter Support - 1 x daily - 7 x weekly - 4 sets - 5 reps Heel Raises with Counter Support - 1 x daily - 7 x weekly - 2 sets - 10 reps Mini Squat with Counter Support - 1 x daily - 7 x weekly - 2 sets - 10 reps Supine Posterior Pelvic Tilt - 1 x daily - 7 x weekly - 2 sets - 10 reps - 5 hold Sit to Stand - 3 x daily - 7 x weekly - 2 sets - 10 reps

## 2020-09-08 LAB — VITAMIN B1: Vitamin B1 (Thiamine): 11 nmol/L (ref 8–30)

## 2020-09-08 LAB — ZINC: Zinc: 66 ug/dL (ref 60–130)

## 2020-09-09 ENCOUNTER — Telehealth: Payer: Self-pay

## 2020-09-09 NOTE — Telephone Encounter (Signed)
Per PCP she can come in on 7/28 at 11.40am for a biopsy of the area.  I have attempted to reach the pt to schedule but did not make contact. Phone continuously rings, no VM to suggest leaving a VM.   If pt calls back please schedule OV recommended above.

## 2020-09-10 ENCOUNTER — Ambulatory Visit: Payer: Medicare Other | Admitting: Physical Therapy

## 2020-09-10 ENCOUNTER — Other Ambulatory Visit: Payer: Self-pay

## 2020-09-10 ENCOUNTER — Encounter: Payer: Self-pay | Admitting: Physical Therapy

## 2020-09-10 VITALS — BP 121/62 | HR 70

## 2020-09-10 DIAGNOSIS — M6281 Muscle weakness (generalized): Secondary | ICD-10-CM | POA: Diagnosis not present

## 2020-09-10 DIAGNOSIS — R2681 Unsteadiness on feet: Secondary | ICD-10-CM

## 2020-09-10 DIAGNOSIS — R296 Repeated falls: Secondary | ICD-10-CM | POA: Diagnosis not present

## 2020-09-10 DIAGNOSIS — R2689 Other abnormalities of gait and mobility: Secondary | ICD-10-CM | POA: Diagnosis not present

## 2020-09-10 DIAGNOSIS — M545 Low back pain, unspecified: Secondary | ICD-10-CM | POA: Diagnosis not present

## 2020-09-10 NOTE — Therapy (Signed)
[facility] [place], [place], [address] Phone: [phone]   Fax:  [phone]  Physical Therapy Treatment  Patient Details  Name: Kelsey Church MRN: [id number] Date of Birth: [birth year] Referring Provider (PT): Riki Sheer, DO   Encounter Date: 09/10/2020   PT End of Session - 09/10/20 1441     Visit Number 3    Number of Visits 13    Date for PT Re-Evaluation 10/13/20    Authorization Type Medicare & BCBS    Progress Note Due on Visit 10    PT Start Time 2751    PT Stop Time 1441    PT Time Calculation (min) 43 min    Activity Tolerance Patient tolerated treatment well;Patient limited by pain    Behavior During Therapy WFL for tasks assessed/performed             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/10/13   Cancer ([place])    melanoma in 2 sights   Complication of anesthesia    "chills every evening at sundown x 2 weeks"- no fever   Depression    Dysphagia    History of shingles    NO RESIDUAL PROBLEMS   Hypertension    Pain    LOWER BACK PAIN   Sleep apnea    CPAP    Past Surgical History:  Procedure Laterality Date   APPENDECTOMY  1958   COLONOSCOPY     COLONOSCOPY W/ POLYPECTOMY     7 polyps   FOOT SURGERY Right    removal plantar wart   GALLBLADDER SURGERY  02/14/1990   INJECTION KNEE  09/2011   right    KNEE ARTHROSCOPY  1996   LEFT HEART CATH AND CORONARY ANGIOGRAPHY N/A 07/11/2019   Procedure: LEFT HEART CATH AND CORONARY ANGIOGRAPHY;  Surgeon: Belva Crome, MD;  Location: [facility];  Service: Cardiovascular;  Laterality: N/A;   MELANOMA EXCISION  1992   right chin, back   POLYPECTOMY     SALPINGOOPHORECTOMY  2009   Right   TOTAL KNEE ARTHROPLASTY Right 06/10/2013   Procedure: RIGHT TOTAL KNEE ARTHROPLASTY;  Surgeon: [name], MD;  Location: [facility];  Service:  Orthopedics;  Laterality: Right;   TOTAL KNEE ARTHROPLASTY Left 12/16/2013   Procedure: LEFT TOTAL KNEE ARTHROPLASTY;  Surgeon: [name], MD;  Location: [facility];  Service: Orthopedics;  Laterality: Left;    Vitals:   09/10/20 1411  BP: 121/62  Pulse: 70  SpO2: 96%     Subjective Assessment - 09/10/20 1400     Subjective Doing pretty good. Has an appointment for her rash in August. Denies questions/concerns on HEP. Notes that the L hip hurt a bit after doing the hip abduction.    Pertinent History B TKA 2015, melanoma, dysphagia, GERD, HTN, LBP, renal cell carcinoma with L nephrectomy 2022    Diagnostic tests none recent    Patient Stated Goals improve balance    Currently in Pain? No/denies                               [facility] Adult PT Treatment/Exercise - 09/10/20 0001       Knee/Hip Exercises: Stretches   ITB Stretch Left;Right;1 rep;30 seconds    ITB Stretch Limitations supine with strap  Piriformis Stretch Right;Left;1 rep;30 seconds    Piriformis Stretch Limitations sitting KTOS    Gastroc Stretch Right;Left;1 rep;30 seconds    Gastroc Stretch Limitations runner's stretch    Other Knee/Hip Stretches R/L sitting fig 4 30" each      Knee/Hip Exercises: Aerobic   Recumbent Bike L1 x 6 min      Knee/Hip Exercises: Standing   Heel Raises Both;1 set;10 reps    Heel Raises Limitations heel/toe raise at window    Hip Abduction Stengthening;Right;Left;2 sets;5 sets;Knee straight    Abduction Limitations 5 reps, smaller sets needed due to L knee pain.    Other Standing Knee Exercises sidestepping with yellow loop above knees along windows 4x      Knee/Hip Exercises: Seated   Ball Squeeze 10x5"    Other Seated Knee/Hip Exercises isometric ab set 10x5"   cues to maintain normal breathing pattern   Sit to Sand 10 reps;without UE support   cues for increased eccentric control                   PT Education - 09/10/20 1441     Education  Details update to HEP    Person(s) Educated Patient    Methods Explanation;Demonstration;Tactile cues;Verbal cues;Handout    Comprehension Returned demonstration;Verbalized understanding              PT Short Term Goals - 09/10/20 1442       PT SHORT TERM GOAL #1   Title Patient to demonstrate independence with initial HEP.    Time 3    Period Weeks    Status On-going    Target Date 09/22/20               PT Long Term Goals - 09/10/20 1442       PT LONG TERM GOAL #1   Title Patient to be independent with advanced HEP.    Time 6    Period Weeks    Status On-going      PT LONG TERM GOAL #2   Title Patient to demonstrate B LE strength >/=4+/5.    Time 6    Period Weeks    Status On-going      PT LONG TERM GOAL #3   Title Patient to demonstrate atleast 10 degrees of B ankle AROM.    Time 6    Period Weeks    Status On-going      PT LONG TERM GOAL #4   Title Patient to demonstrate safe floor transfer with mod I.    Time 6    Period Weeks    Status On-going      PT LONG TERM GOAL #5   Title Patient to score >19/24 on DGI in order to decrease fall risk.    Time 6    Period Weeks    Status On-going      PT LONG TERM GOAL #6   Title Patient to return to exercise class or fitness regimen for improved endurance.    Time 6    Period Weeks    Status On-going                   Plan - 09/10/20 1442     Clinical Impression Statement Patient arrived to session with report of mild L hip pain with hip abduction HEP, otherwise okay. Worked on sitting hip stretching with patient reporting slight discomfort with ER. Patient demonstrated good carryover of proper STS form; today with  additional cueing for increased eccentric control. Standing LE stretching and strengthening ther-ex with limitation in B ankle dorsiflexion evident, however patient demonstrated good care to avoid compensations with exercises. Patient tolerated duration of session well despite  instances of mild discomfort. Patient reported "I always feel better after I come here."    Personal Factors and Comorbidities Age;Comorbidity 3+;Fitness;Past/Current Experience;Time since onset of injury/illness/exacerbation    Comorbidities B TKA 2015, melanoma, dysphagia, GERD, HTN, LBP, renal cell carcinoma with L nephrectomy 2022    Examination-Activity Limitations Bathing;Squat;Stairs;Bend;Stand;Carry;Toileting;Transfers;Dressing;Hygiene/Grooming;Lift;Locomotion Level;Reach Overhead    Examination-Participation Restrictions Church;Cleaning;Shop;Community Activity;Laundry;Meal Prep    Stability/Clinical Decision Making Stable/Uncomplicated    Rehab Potential Good    PT Frequency 2x / week    PT Duration 6 weeks    PT Treatment/Interventions ADLs/Self Care Home Management;Canalith Repostioning;Cryotherapy;Electrical Stimulation;Moist Heat;Balance training;Therapeutic exercise;Therapeutic activities;Functional mobility training;Stair training;Gait training;Ultrasound;Neuromuscular re-education;DME Instruction;Patient/family education;Manual techniques;Vestibular;Taping;Energy conservation;Dry needling;Passive range of motion    PT Next Visit Plan DGI, progress balance and LE strengthening    PT Home Exercise Plan Access Code: [email or web address]    Consulted and Agree with Plan of Care Patient             Patient will benefit from skilled therapeutic intervention in order to improve the following deficits and impairments:  Decreased endurance, Abnormal gait, Hypomobility, Decreased activity tolerance, Decreased strength, Increased fascial restricitons, Pain, Decreased balance, Difficulty walking, Improper body mechanics, Dizziness, Decreased range of motion, Impaired flexibility, Postural dysfunction  Visit Diagnosis: Unsteadiness on feet  Repeated falls  Muscle weakness (generalized)  Other abnormalities of gait and mobility  Acute bilateral low back pain without sciatica     Problem  List Patient Active Problem List   Diagnosis Date Noted   Rash and nonspecific skin eruption 08/27/2020   Deficiency anemia 08/27/2020   Dyslipidemia 09/19/2019   Atherosclerotic heart disease of native coronary artery with other forms of angina pectoris ([place])    Educated about COVID-19 virus infection 03/14/2019   Tinea incognito 12/26/2018   Symptomatic PVCs 12/26/2018   Stage 3a chronic kidney disease ([place]) 12/26/2018   Degenerative lumbar disc 11/01/2016   Gastroesophageal reflux disease with esophagitis 12/10/2014   Moderate osteopenia 12/10/2014   Vitamin B12 deficiency 12/10/2014   OA (osteoarthritis) of knee 12/16/2013   Prediabetes 12/03/2013   Other screening mammogram 09/25/2012   DJD (degenerative joint disease) of knee 09/09/2011   OSA (obstructive sleep apnea) 07/29/2011   Essential hypertension, benign 07/29/2011   Pure hypercholesterolemia 07/29/2011   Routine general medical examination at a health care facility 07/29/2011    Janene Harvey, PT, DPT 09/10/20 2:44 PM    [facility] [address]  [place], [place], [address] Phone: [phone]   Fax:  [phone]  Name: Kelsey Church MRN: [id number] Date of Birth: 1950/12/29

## 2020-09-14 DIAGNOSIS — N1831 Chronic kidney disease, stage 3a: Secondary | ICD-10-CM | POA: Diagnosis not present

## 2020-09-17 ENCOUNTER — Other Ambulatory Visit: Payer: Self-pay

## 2020-09-17 ENCOUNTER — Ambulatory Visit: Payer: Medicare Other | Attending: Family Medicine | Admitting: Physical Therapy

## 2020-09-17 DIAGNOSIS — R296 Repeated falls: Secondary | ICD-10-CM | POA: Diagnosis not present

## 2020-09-17 DIAGNOSIS — M545 Low back pain, unspecified: Secondary | ICD-10-CM | POA: Diagnosis not present

## 2020-09-17 DIAGNOSIS — R2681 Unsteadiness on feet: Secondary | ICD-10-CM | POA: Diagnosis not present

## 2020-09-17 DIAGNOSIS — R2689 Other abnormalities of gait and mobility: Secondary | ICD-10-CM

## 2020-09-17 DIAGNOSIS — M6281 Muscle weakness (generalized): Secondary | ICD-10-CM

## 2020-09-17 NOTE — Therapy (Signed)
[facility] [place], [place], [address] Phone: [phone]   Fax:  [phone]  Physical Therapy Treatment  Patient Details  Name: Kelsey Church MRN: [id number] Date of Birth: [birth year] Referring Provider (PT): Riki Sheer, DO   Encounter Date: 09/17/2020   PT End of Session - 09/17/20 1616     Visit Number 4    Number of Visits 13    Date for PT Re-Evaluation 10/13/20    Authorization Type Medicare & BCBS    Progress Note Due on Visit 10    PT Start Time 1401    PT Stop Time 1444    PT Time Calculation (min) 43 min    Activity Tolerance Patient tolerated treatment well    Behavior During Therapy WFL for tasks assessed/performed             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/10/13   Cancer ([place])    melanoma in 2 sights   Complication of anesthesia    "chills every evening at sundown x 2 weeks"- no fever   Depression    Dysphagia    History of shingles    NO RESIDUAL PROBLEMS   Hypertension    Pain    LOWER BACK PAIN   Sleep apnea    CPAP    Past Surgical History:  Procedure Laterality Date   APPENDECTOMY  1958   COLONOSCOPY     COLONOSCOPY W/ POLYPECTOMY     7 polyps   FOOT SURGERY Right    removal plantar wart   GALLBLADDER SURGERY  02/14/1990   INJECTION KNEE  09/2011   right    KNEE ARTHROSCOPY  1996   LEFT HEART CATH AND CORONARY ANGIOGRAPHY N/A 07/11/2019   Procedure: LEFT HEART CATH AND CORONARY ANGIOGRAPHY;  Surgeon: Belva Crome, MD;  Location: [facility];  Service: Cardiovascular;  Laterality: N/A;   MELANOMA EXCISION  1992   right chin, back   POLYPECTOMY     SALPINGOOPHORECTOMY  2009   Right   TOTAL KNEE ARTHROPLASTY Right 06/10/2013   Procedure: RIGHT TOTAL KNEE ARTHROPLASTY;  Surgeon: [name], MD;  Location: [facility];  Service: Orthopedics;  Laterality:  Right;   TOTAL KNEE ARTHROPLASTY Left 12/16/2013   Procedure: LEFT TOTAL KNEE ARTHROPLASTY;  Surgeon: [name], MD;  Location: [facility];  Service: Orthopedics;  Laterality: Left;    There were no vitals filed for this visit.   Subjective Assessment - 09/17/20 1404     Subjective Pt. missed appt. on Monday due to GI upset but feeling better today.  Stopped taking the bicarb since that was a new medication and contacted her nephrologist who has ordered more laps.  Denies falls.    Pertinent History B TKA 2015, melanoma, dysphagia, GERD, HTN, LBP, renal cell carcinoma with L nephrectomy 2022    Limitations Lifting;Standing;Walking;House hold activities    Patient Stated Goals improve balance    Currently in Pain? No/denies                               [facility] Adult PT Treatment/Exercise - 09/17/20 0001       Balance   Balance Assessed No      High Level Balance   High Level Balance Activities Other (comment)  High Level Balance Comments On Airex with close SBA for safety: Eyes open feet apart x 30 sec  Eyes open feet apart with head nods x 10- increased sway backwards Eyes open feet apart with head turns x 10 Eyes closed feet apart x 30 sec - significant sway  In corner on solid surface with SBA for safety: Eyes closed feet together x 30 sec Eyes closed feet together with head nods x 10 Eyes closed feet together with head turns x 10  Semi tandem stance x 15 sec bil - challenging     Knee/Hip Exercises: Stretches   ITB Stretch Left;Right;1 rep;30 seconds    Other Knee/Hip Stretches R/L sitting fig 4 30" each    Other Knee/Hip Stretches LTR x 10      Knee/Hip Exercises: Aerobic   Recumbent Bike L1 x 6 min      Knee/Hip Exercises: Standing   Heel Raises Both;1 set;10 reps    Heel Raises Limitations heel/toe raise at window on step for eccentric strengthening    Lateral Step Up Both;2 sets;10 reps;Step Height: 2";Hand Hold: 1    Forward Step Up Both;2  sets;10 reps;Hand Hold: 1;Step Height: 4"                    PT Education - 09/17/20 1612     Education Details educated on balance systems and progressed HEP.    Person(s) Educated Patient    Methods Explanation;Demonstration;Handout;Verbal cues    Comprehension Verbalized understanding;Returned demonstration              PT Short Term Goals - 09/10/20 1442       PT SHORT TERM GOAL #1   Title Patient to demonstrate independence with initial HEP.    Time 3    Period Weeks    Status On-going    Target Date 09/22/20               PT Long Term Goals - 09/10/20 1442       PT LONG TERM GOAL #1   Title Patient to be independent with advanced HEP.    Time 6    Period Weeks    Status On-going      PT LONG TERM GOAL #2   Title Patient to demonstrate B LE strength >/=4+/5.    Time 6    Period Weeks    Status On-going      PT LONG TERM GOAL #3   Title Patient to demonstrate atleast 10 degrees of B ankle AROM.    Time 6    Period Weeks    Status On-going      PT LONG TERM GOAL #4   Title Patient to demonstrate safe floor transfer with mod I.    Time 6    Period Weeks    Status On-going      PT LONG TERM GOAL #5   Title Patient to score >19/24 on DGI in order to decrease fall risk.    Time 6    Period Weeks    Status On-going      PT LONG TERM GOAL #6   Title Patient to return to exercise class or fitness regimen for improved endurance.    Time 6    Period Weeks    Status On-going                   Plan - 09/17/20 1616     Clinical Impression Statement Pt reported no  pain, has switched shoes since feeling like current sneakers not wide enough for foot.  with modified CTSIB noted only able to maintain position 4 (eyes closed on compliant surface) for 12 sec, with signficant backwards sway.  Pt. feels that this felt like when she lost her balance and feel before.  Also very challenged on airex surface with eyes closed or with head nods.   In corner on solid surface she was not challenged until tried standing in semitandem stance, this was very difficulty and she did report pulling in her L hip.  Reviewed stretches given previously.  Pt. would benefit from continued skilled therapy.    Personal Factors and Comorbidities Age;Comorbidity 3+;Fitness;Past/Current Experience;Time since onset of injury/illness/exacerbation    Comorbidities B TKA 2015, melanoma, dysphagia, GERD, HTN, LBP, renal cell carcinoma with L nephrectomy 2022    Examination-Activity Limitations Bathing;Squat;Stairs;Bend;Stand;Carry;Toileting;Transfers;Dressing;Hygiene/Grooming;Lift;Locomotion Level;Reach Overhead    Examination-Participation Restrictions Church;Cleaning;Shop;Community Activity;Laundry;Meal Prep    Stability/Clinical Decision Making Stable/Uncomplicated    Rehab Potential Good    PT Frequency 2x / week    PT Duration 6 weeks    PT Treatment/Interventions ADLs/Self Care Home Management;Canalith Repostioning;Cryotherapy;Electrical Stimulation;Moist Heat;Balance training;Therapeutic exercise;Therapeutic activities;Functional mobility training;Stair training;Gait training;Ultrasound;Neuromuscular re-education;DME Instruction;Patient/family education;Manual techniques;Vestibular;Taping;Energy conservation;Dry needling;Passive range of motion    PT Next Visit Plan DGI, progress balance and LE strengthening    PT Home Exercise Plan Access Code: [email or web address]    Consulted and Agree with Plan of Care Patient             Patient will benefit from skilled therapeutic intervention in order to improve the following deficits and impairments:  Decreased endurance, Abnormal gait, Hypomobility, Decreased activity tolerance, Decreased strength, Increased fascial restricitons, Pain, Decreased balance, Difficulty walking, Improper body mechanics, Dizziness, Decreased range of motion, Impaired flexibility, Postural dysfunction  Visit Diagnosis: Unsteadiness on  feet  Repeated falls  Muscle weakness (generalized)  Other abnormalities of gait and mobility  Acute bilateral low back pain without sciatica     Problem List Patient Active Problem List   Diagnosis Date Noted   Rash and nonspecific skin eruption 08/27/2020   Deficiency anemia 08/27/2020   Dyslipidemia 09/19/2019   Atherosclerotic heart disease of native coronary artery with other forms of angina pectoris ([place])    Educated about COVID-19 virus infection 03/14/2019   Tinea incognito 12/26/2018   Symptomatic PVCs 12/26/2018   Stage 3a chronic kidney disease ([place]) 12/26/2018   Degenerative lumbar disc 11/01/2016   Gastroesophageal reflux disease with esophagitis 12/10/2014   Moderate osteopenia 12/10/2014   Vitamin B12 deficiency 12/10/2014   OA (osteoarthritis) of knee 12/16/2013   Prediabetes 12/03/2013   Other screening mammogram 09/25/2012   DJD (degenerative joint disease) of knee 09/09/2011   OSA (obstructive sleep apnea) 07/29/2011   Essential hypertension, benign 07/29/2011   Pure hypercholesterolemia 07/29/2011   Routine general medical examination at a health care facility 07/29/2011    Rennie Natter PT, DPT 09/17/2020, 4:21 PM  [facility] [address]  [place], [place], [address] Phone: [phone]   Fax:  [phone]  Name: Kelsey Church MRN: [id number] Date of Birth: [birth year]

## 2020-09-17 NOTE — Patient Instructions (Signed)
Access Code: [email or web address] URL: [email or web address] Date: 09/17/2020 Prepared by: Glenetta Hew  Exercises Standing Balance in Corner with Eyes Closed - 1 x daily - 7 x weekly - 1 sets - 1 reps - 30 sec hold

## 2020-09-21 ENCOUNTER — Ambulatory Visit: Payer: Medicare Other

## 2020-09-21 ENCOUNTER — Other Ambulatory Visit: Payer: Self-pay

## 2020-09-21 DIAGNOSIS — R2689 Other abnormalities of gait and mobility: Secondary | ICD-10-CM

## 2020-09-21 DIAGNOSIS — M6281 Muscle weakness (generalized): Secondary | ICD-10-CM

## 2020-09-21 DIAGNOSIS — L565 Disseminated superficial actinic porokeratosis (DSAP): Secondary | ICD-10-CM | POA: Diagnosis not present

## 2020-09-21 DIAGNOSIS — L853 Xerosis cutis: Secondary | ICD-10-CM | POA: Diagnosis not present

## 2020-09-21 DIAGNOSIS — M545 Low back pain, unspecified: Secondary | ICD-10-CM

## 2020-09-21 DIAGNOSIS — R2681 Unsteadiness on feet: Secondary | ICD-10-CM | POA: Diagnosis not present

## 2020-09-21 DIAGNOSIS — Z85828 Personal history of other malignant neoplasm of skin: Secondary | ICD-10-CM | POA: Diagnosis not present

## 2020-09-21 DIAGNOSIS — L570 Actinic keratosis: Secondary | ICD-10-CM | POA: Diagnosis not present

## 2020-09-21 DIAGNOSIS — L814 Other melanin hyperpigmentation: Secondary | ICD-10-CM | POA: Diagnosis not present

## 2020-09-21 DIAGNOSIS — R296 Repeated falls: Secondary | ICD-10-CM | POA: Diagnosis not present

## 2020-09-21 DIAGNOSIS — D225 Melanocytic nevi of trunk: Secondary | ICD-10-CM | POA: Diagnosis not present

## 2020-09-21 DIAGNOSIS — L821 Other seborrheic keratosis: Secondary | ICD-10-CM | POA: Diagnosis not present

## 2020-09-21 DIAGNOSIS — D485 Neoplasm of uncertain behavior of skin: Secondary | ICD-10-CM | POA: Diagnosis not present

## 2020-09-21 DIAGNOSIS — Z85820 Personal history of malignant melanoma of skin: Secondary | ICD-10-CM | POA: Diagnosis not present

## 2020-09-21 NOTE — Therapy (Signed)
[facility] [address]  [place], [place], [address] Phone: [phone]   Fax:  [phone]  Physical Therapy Treatment  Patient Details  Name: MILAGROS MIDDENDORF MRN: [id number] Date of Birth: 1950/06/17 Referring Provider (PT): Riki Sheer, DO   Encounter Date: 09/21/2020   PT End of Session - 09/21/20 1441     Visit Number 5    Number of Visits 13    Date for PT Re-Evaluation 10/13/20    Authorization Type Medicare & BCBS    Progress Note Due on Visit 10    PT Start Time 1403    PT Stop Time 1443    PT Time Calculation (min) 40 min    Activity Tolerance Patient tolerated treatment well;Patient limited by fatigue    Behavior During Therapy WFL for tasks assessed/performed             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/10/13   Cancer ([place])    melanoma in 2 sights   Complication of anesthesia    "chills every evening at sundown x 2 weeks"- no fever   Depression    Dysphagia    History of shingles    NO RESIDUAL PROBLEMS   Hypertension    Pain    LOWER BACK PAIN   Sleep apnea    CPAP    Past Surgical History:  Procedure Laterality Date   APPENDECTOMY  1958   COLONOSCOPY     COLONOSCOPY W/ POLYPECTOMY     7 polyps   FOOT SURGERY Right    removal plantar wart   GALLBLADDER SURGERY  02/14/1990   INJECTION KNEE  09/2011   right    KNEE ARTHROSCOPY  1996   LEFT HEART CATH AND CORONARY ANGIOGRAPHY N/A 07/11/2019   Procedure: LEFT HEART CATH AND CORONARY ANGIOGRAPHY;  Surgeon: Belva Crome, MD;  Location: [facility];  Service: Cardiovascular;  Laterality: N/A;   MELANOMA EXCISION  1992   right chin, back   POLYPECTOMY     SALPINGOOPHORECTOMY  2009   Right   TOTAL KNEE ARTHROPLASTY Right 06/10/2013   Procedure: RIGHT TOTAL KNEE ARTHROPLASTY;  Surgeon: [name], MD;  Location: [facility];  Service:  Orthopedics;  Laterality: Right;   TOTAL KNEE ARTHROPLASTY Left 12/16/2013   Procedure: LEFT TOTAL KNEE ARTHROPLASTY;  Surgeon: [name], MD;  Location: [facility];  Service: Orthopedics;  Laterality: Left;    There were no vitals filed for this visit.   Subjective Assessment - 09/21/20 1405     Subjective Notes much improvement with her new shoes and balance so far from PT.    Pertinent History B TKA 2015, melanoma, dysphagia, GERD, HTN, LBP, renal cell carcinoma with L nephrectomy 2022    Diagnostic tests none recent    Patient Stated Goals improve balance    Currently in Pain? No/denies                [facility] PT Assessment - 09/21/20 0001       Standardized Balance Assessment   Standardized Balance Assessment Dynamic Gait Index      Dynamic Gait Index   Level Surface Normal    Change in Gait Speed Normal    Gait with Horizontal Head Turns Normal    Gait with Vertical Head Turns Normal    Gait and Pivot Turn Mild  Impairment    Step Over Obstacle Normal    Step Around Obstacles Normal    Steps Mild Impairment    Total Score 22                           [facility] Adult PT Treatment/Exercise - 09/21/20 0001       Knee/Hip Exercises: Stretches   Active Hamstring Stretch Both;2 reps;30 seconds    Active Hamstring Stretch Limitations seated    Gastroc Stretch Both;2 reps;30 seconds    Gastroc Stretch Limitations runner's stretch      Knee/Hip Exercises: Aerobic   Nustep [id number]      Knee/Hip Exercises: Standing   Hip Abduction Stengthening;Both;10 reps;Knee straight    Abduction Limitations yellow TB    Hip Extension Stengthening;Both;2 sets;10 reps;Knee straight    Extension Limitations yellow TB at knees    Other Standing Knee Exercises marches 2x10 yellow TB                      PT Short Term Goals - 09/10/20 1442       PT SHORT TERM GOAL #1   Title Patient to demonstrate independence with initial HEP.    Time 3    Period  Weeks    Status Acheived   Target Date 09/22/20               PT Long Term Goals - 09/21/20 1431       PT LONG TERM GOAL #1   Title Patient to be independent with advanced HEP.    Time 6    Period Weeks    Status On-going      PT LONG TERM GOAL #2   Title Patient to demonstrate B LE strength >/=4+/5.    Time 6    Period Weeks    Status On-going      PT LONG TERM GOAL #3   Title Patient to demonstrate atleast 10 degrees of B ankle AROM.    Time 6    Period Weeks    Status On-going      PT LONG TERM GOAL #4   Title Patient to demonstrate safe floor transfer with mod I.    Time 6    Period Weeks    Status On-going      PT LONG TERM GOAL #5   Title Patient to score >19/24 on DGI in order to decrease fall risk.    Time 6    Period Weeks    Status Achieved      PT LONG TERM GOAL #6   Title Patient to return to exercise class or fitness regimen for improved endurance.    Time 6    Period Weeks    Status On-going                   Plan - 09/21/20 1443     Clinical Impression Statement Pt scored 22/24 on DGI today, indicating not much risk for falls. She has met LTG 5. She notes improvement with movement due to changing her sneakers and from PT so far. After the DGI we did some hip strengthening exercises, she demonstrated generalized hip weakness and decreased endurance in hip muscles. Pt had no complaints during session.    Personal Factors and Comorbidities Age;Comorbidity 3+;Fitness;Past/Current Experience;Time since onset of injury/illness/exacerbation    Comorbidities B TKA 2015, melanoma, dysphagia, GERD, HTN, LBP, renal cell carcinoma with L nephrectomy  2022    PT Frequency 2x / week    PT Duration 6 weeks    PT Treatment/Interventions ADLs/Self Care Home Management;Canalith Repostioning;Cryotherapy;Electrical Stimulation;Moist Heat;Balance training;Therapeutic exercise;Therapeutic activities;Functional mobility training;Stair training;Gait  training;Ultrasound;Neuromuscular re-education;DME Instruction;Patient/family education;Manual techniques;Vestibular;Taping;Energy conservation;Dry needling;Passive range of motion    PT Next Visit Plan progress balance and LE strengthening    PT Home Exercise Plan Access Code: [email or web address]    Consulted and Agree with Plan of Care Patient             Patient will benefit from skilled therapeutic intervention in order to improve the following deficits and impairments:  Decreased endurance, Abnormal gait, Hypomobility, Decreased activity tolerance, Decreased strength, Increased fascial restricitons, Pain, Decreased balance, Difficulty walking, Improper body mechanics, Dizziness, Decreased range of motion, Impaired flexibility, Postural dysfunction  Visit Diagnosis: Unsteadiness on feet  Repeated falls  Muscle weakness (generalized)  Other abnormalities of gait and mobility  Acute bilateral low back pain without sciatica     Problem List Patient Active Problem List   Diagnosis Date Noted   Rash and nonspecific skin eruption 08/27/2020   Deficiency anemia 08/27/2020   Dyslipidemia 09/19/2019   Atherosclerotic heart disease of native coronary artery with other forms of angina pectoris ([place])    Educated about COVID-19 virus infection 03/14/2019   Tinea incognito 12/26/2018   Symptomatic PVCs 12/26/2018   Stage 3a chronic kidney disease ([place]) 12/26/2018   Degenerative lumbar disc 11/01/2016   Gastroesophageal reflux disease with esophagitis 12/10/2014   Moderate osteopenia 12/10/2014   Vitamin B12 deficiency 12/10/2014   OA (osteoarthritis) of knee 12/16/2013   Prediabetes 12/03/2013   Other screening mammogram 09/25/2012   DJD (degenerative joint disease) of knee 09/09/2011   OSA (obstructive sleep apnea) 07/29/2011   Essential hypertension, benign 07/29/2011   Pure hypercholesterolemia 07/29/2011   Routine general medical examination at a health care facility 07/29/2011     Artist Pais, PTA 09/21/2020, 3:34 PM  [facility] [address]  [place], [place], [address] Phone: [phone]   Fax:  [phone]  Name: HOORAIN KOZAKIEWICZ MRN: [id number] Date of Birth: [birth year]

## 2020-09-23 ENCOUNTER — Ambulatory Visit: Payer: Medicare Other | Admitting: Physical Therapy

## 2020-09-23 ENCOUNTER — Encounter: Payer: Self-pay | Admitting: Physical Therapy

## 2020-09-23 ENCOUNTER — Other Ambulatory Visit: Payer: Self-pay

## 2020-09-23 DIAGNOSIS — M6281 Muscle weakness (generalized): Secondary | ICD-10-CM | POA: Diagnosis not present

## 2020-09-23 DIAGNOSIS — R2689 Other abnormalities of gait and mobility: Secondary | ICD-10-CM

## 2020-09-23 DIAGNOSIS — R2681 Unsteadiness on feet: Secondary | ICD-10-CM | POA: Diagnosis not present

## 2020-09-23 DIAGNOSIS — R296 Repeated falls: Secondary | ICD-10-CM

## 2020-09-23 DIAGNOSIS — M545 Low back pain, unspecified: Secondary | ICD-10-CM

## 2020-09-23 NOTE — Therapy (Signed)
[place], [place], [address] Phone: [phone]   Fax:  [phone]  Physical Therapy Treatment  Patient Details  Name: Kelsey Church MRN: [id number] Date of Birth: Feb 02, 1951 Referring Provider (PT): Riki Sheer, [place]   Encounter Date: 09/23/2020   PT End of Session - 09/23/20 1107     Visit Number 6    Number of Visits 13    Date for PT Re-Evaluation 10/13/20    Authorization Type Medicare & BCBS    Progress Note Due on Visit 10    PT Start Time 1102    PT Stop Time 1145    PT Time Calculation (min) 43 min    Activity Tolerance Patient tolerated treatment well    Behavior During Therapy WFL for tasks assessed/performed             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/10/13   Cancer ([place])    melanoma in 2 sights   Complication of anesthesia    "chills every evening at sundown x 2 weeks"- no fever   Depression    Dysphagia    History of shingles    NO RESIDUAL PROBLEMS   Hypertension    Pain    LOWER BACK PAIN   Sleep apnea    CPAP    Past Surgical History:  Procedure Laterality Date   APPENDECTOMY  1958   COLONOSCOPY     COLONOSCOPY W/ POLYPECTOMY     7 polyps   FOOT SURGERY Right    removal plantar wart   GALLBLADDER SURGERY  02/14/1990   INJECTION KNEE  09/2011   right    KNEE ARTHROSCOPY  1996   LEFT HEART CATH AND CORONARY ANGIOGRAPHY N/A 07/11/2019   Procedure: LEFT HEART CATH AND CORONARY ANGIOGRAPHY;  Surgeon: Belva Crome, MD;  Location: [facility];  Service: Cardiovascular;  Laterality: N/A;   MELANOMA EXCISION  1992   right chin, back   POLYPECTOMY     SALPINGOOPHORECTOMY  2009   Right   TOTAL KNEE ARTHROPLASTY Right 06/10/2013   Procedure: RIGHT TOTAL KNEE ARTHROPLASTY;  Surgeon: [name], MD;  Location: [facility];  Service: Orthopedics;  Laterality:  Right;   TOTAL KNEE ARTHROPLASTY Left 12/16/2013   Procedure: LEFT TOTAL KNEE ARTHROPLASTY;  Surgeon: [name], MD;  Location: [facility];  Service: Orthopedics;  Laterality: Left;    There were no vitals filed for this visit.   Subjective Assessment - 09/23/20 1106     Subjective Patient reported that she got ok from nephrologist to stop sodium bicarb, but she had already stopped.  Wearing new shoes with wider toe box today, thinks foot pain has been from shoes.  Denies falls or LOB.    Pertinent History B TKA 2015, melanoma, dysphagia, GERD, HTN, LBP, renal cell carcinoma with L nephrectomy 2022    Diagnostic tests none recent    Patient Stated Goals improve balance    Currently in Pain? No/denies                    [facility] Adult PT Treatment/Exercise - 09/23/20 0001       High Level Balance   High Level Balance Activities Tandem walking   alternating cone taps x 10 bil, alt cone taps x 10 bil while standing on airex, SLS vectors  0-180 deg x 3 bil, taps all directions following commands x 10   High Level Balance Comments SBA for safety throughout, no LOB      Knee/Hip Exercises: Stretches   Piriformis Stretch Left;2 reps;30 seconds    Other Knee/Hip Stretches reviewed seated stretch, demo only      Knee/Hip Exercises: Aerobic   Nustep L5x2min      Manual Therapy   Manual Therapy Soft tissue mobilization    Manual therapy comments sidelying     Soft tissue mobilization L piriformis, superior glute                       PT Short Term Goals - 09/10/20 1442       PT SHORT TERM GOAL #1   Title Patient to demonstrate independence with initial HEP.    Time 3    Period Weeks    Status On-going    Target Date 09/22/20               PT Long Term Goals - 09/21/20 1431       PT LONG TERM GOAL #1   Title Patient to be independent with advanced HEP.    Time 6    Period Weeks    Status On-going      PT LONG TERM GOAL #2   Title Patient to  demonstrate B LE strength >/=4+/5.    Time 6    Period Weeks    Status On-going      PT LONG TERM GOAL #3   Title Patient to demonstrate atleast 10 degrees of B ankle AROM.    Time 6    Period Weeks    Status On-going      PT LONG TERM GOAL #4   Title Patient to demonstrate safe floor transfer with mod I.    Time 6    Period Weeks    Status On-going      PT LONG TERM GOAL #5   Title Patient to score >19/24 on DGI in order to decrease fall risk.    Time 6    Period Weeks    Status Achieved      PT LONG TERM GOAL #6   Title Patient to return to exercise class or fitness regimen for improved endurance.    Time 6    Period Weeks    Status On-going                   Plan - 09/23/20 1150     Clinical Impression Statement Pt. reports some soreness in L glut today from last session, but able to fully participate in session.  Focus was on higher level balance exercises including cone taps and dome taps to promote SLS, both on level surface and airex, while challenged demonstrated good stability and able to keep hips level, although reported more tightness in L glut region as session progressed.  End of session stretched and then performed STM with foam roller to L glute which decreased tightness.  She is making good progress and would benefit from continued skilled therapy.    Personal Factors and Comorbidities Age;Comorbidity 3+;Fitness;Past/Current Experience;Time since onset of injury/illness/exacerbation    Comorbidities B TKA 2015, melanoma, dysphagia, GERD, HTN, LBP, renal cell carcinoma with L nephrectomy 2022    PT Frequency 2x / week    PT Duration 6 weeks    PT Treatment/Interventions ADLs/Self Care Home Management;Canalith Repostioning;Cryotherapy;Electrical Stimulation;Moist Heat;Balance training;Therapeutic exercise;Therapeutic activities;Functional mobility  training;Stair training;Gait training;Ultrasound;Neuromuscular re-education;DME Instruction;Patient/family  education;Manual techniques;Vestibular;Taping;Energy conservation;Dry needling;Passive range of motion    PT Next Visit Plan progress balance and LE strengthening    PT Home Exercise Plan Access Code: [email or web address]    Consulted and Agree with Plan of Care Patient             Patient will benefit from skilled therapeutic intervention in order to improve the following deficits and impairments:  Decreased endurance, Abnormal gait, Hypomobility, Decreased activity tolerance, Decreased strength, Increased fascial restricitons, Pain, Decreased balance, Difficulty walking, Improper body mechanics, Dizziness, Decreased range of motion, Impaired flexibility, Postural dysfunction  Visit Diagnosis: Unsteadiness on feet  Repeated falls  Muscle weakness (generalized)  Other abnormalities of gait and mobility  Acute bilateral low back pain without sciatica     Problem List Patient Active Problem List   Diagnosis Date Noted   Rash and nonspecific skin eruption 08/27/2020   Deficiency anemia 08/27/2020   Dyslipidemia 09/19/2019   Atherosclerotic heart disease of native coronary artery with other forms of angina pectoris ([place])    Educated about COVID-19 virus infection 03/14/2019   Tinea incognito 12/26/2018   Symptomatic PVCs 12/26/2018   Stage 3a chronic kidney disease ([place]) 12/26/2018   Degenerative lumbar disc 11/01/2016   Gastroesophageal reflux disease with esophagitis 12/10/2014   Moderate osteopenia 12/10/2014   Vitamin B12 deficiency 12/10/2014   OA (osteoarthritis) of knee 12/16/2013   Prediabetes 12/03/2013   Other screening mammogram 09/25/2012   DJD (degenerative joint disease) of knee 09/09/2011   OSA (obstructive sleep apnea) 07/29/2011   Essential hypertension, benign 07/29/2011   Pure hypercholesterolemia 07/29/2011   Routine general medical examination at a health care facility 07/29/2011    Rennie Natter PT, DPT 09/23/2020, 11:55 AM  [facility] [address]  [place], [place], [address] Phone: [phone]   Fax:  [phone]  Name: Kelsey Church MRN: [id number] Date of Birth: 18-Apr-1950

## 2020-09-25 ENCOUNTER — Telehealth: Payer: Self-pay | Admitting: Cardiology

## 2020-09-25 NOTE — Telephone Encounter (Signed)
Called patient, she states that she is in a time limit with this letter/statement from previous mychart message on 08/10. She would like to see if we can do this for her.   She is trying to get out of the contract at her appointment complex, she is trying to move out to go live with her son and daughter in law. She does not like being alone- and with her health problems they need a medical letter stating this.   Appointment is on 08/31- she does mention having some heart racing episodes.

## 2020-09-25 NOTE — Telephone Encounter (Signed)
Pt is following up on a Statement or Letter that she inquired about on 09/23/20. Please advise pt further

## 2020-09-28 ENCOUNTER — Telehealth: Payer: Self-pay | Admitting: Cardiology

## 2020-09-28 ENCOUNTER — Telehealth: Payer: Self-pay | Admitting: Pharmacist Clinician (PhC)/ Clinical Pharmacy Specialist

## 2020-09-28 ENCOUNTER — Ambulatory Visit: Payer: Medicare Other

## 2020-09-28 ENCOUNTER — Other Ambulatory Visit: Payer: Self-pay

## 2020-09-28 DIAGNOSIS — M6281 Muscle weakness (generalized): Secondary | ICD-10-CM

## 2020-09-28 DIAGNOSIS — R296 Repeated falls: Secondary | ICD-10-CM | POA: Diagnosis not present

## 2020-09-28 DIAGNOSIS — M545 Low back pain, unspecified: Secondary | ICD-10-CM

## 2020-09-28 DIAGNOSIS — R2689 Other abnormalities of gait and mobility: Secondary | ICD-10-CM

## 2020-09-28 DIAGNOSIS — R2681 Unsteadiness on feet: Secondary | ICD-10-CM

## 2020-09-28 DIAGNOSIS — E7800 Pure hypercholesterolemia, unspecified: Secondary | ICD-10-CM

## 2020-09-28 NOTE — Telephone Encounter (Signed)
Please see other encounter. Letter sent with Dr. Rosezella [place] recommendations.

## 2020-09-28 NOTE — Telephone Encounter (Signed)
See mychart message.  Patient aware to pick it up.

## 2020-09-28 NOTE — Telephone Encounter (Signed)
Follow Up:    Pt is calling to find out what was decided about her note.

## 2020-09-28 NOTE — Telephone Encounter (Signed)
Kelsey Church (Key: [id number]) REPATHA SURECLICK 140MG  BIWEEKLY PA SENT TO PLAN. ORDERED LABS AND RELEASED THEM

## 2020-09-28 NOTE — Telephone Encounter (Signed)
Attempted to contact patient to let her know that letter was sent, it can be printed via mychart.

## 2020-09-28 NOTE — Therapy (Signed)
[place], [place], [address] Phone: [phone]   Fax:  [phone]  Physical Therapy Treatment  Patient Details  Name: NZINGA FERRAN MRN: [id number] Date of Birth: 02/24/1950 Referring Provider (PT): Riki Sheer, DO   Encounter Date: 09/28/2020   PT End of Session - 09/28/20 1443     Visit Number 7    Number of Visits 13    Date for PT Re-Evaluation 10/13/20    Authorization Type Medicare & BCBS    Progress Note Due on Visit 10    PT Start Time 1403    PT Stop Time 1443    PT Time Calculation (min) 40 min    Activity Tolerance Patient tolerated treatment well;Patient limited by pain    Behavior During Therapy WFL for tasks assessed/performed             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/10/13   Cancer ([place])    melanoma in 2 sights   Complication of anesthesia    "chills every evening at sundown x 2 weeks"- no fever   Depression    Dysphagia    History of shingles    NO RESIDUAL PROBLEMS   Hypertension    Pain    LOWER BACK PAIN   Sleep apnea    CPAP    Past Surgical History:  Procedure Laterality Date   APPENDECTOMY  1958   COLONOSCOPY     COLONOSCOPY W/ POLYPECTOMY     7 polyps   FOOT SURGERY Right    removal plantar wart   GALLBLADDER SURGERY  02/14/1990   INJECTION KNEE  09/2011   right    KNEE ARTHROSCOPY  1996   LEFT HEART CATH AND CORONARY ANGIOGRAPHY N/A 07/11/2019   Procedure: LEFT HEART CATH AND CORONARY ANGIOGRAPHY;  Surgeon: Belva Crome, MD;  Location: [facility];  Service: Cardiovascular;  Laterality: N/A;   MELANOMA EXCISION  1992   right chin, back   POLYPECTOMY     SALPINGOOPHORECTOMY  2009   Right   TOTAL KNEE ARTHROPLASTY Right 06/10/2013   Procedure: RIGHT TOTAL KNEE ARTHROPLASTY;  Surgeon: [name], MD;  Location: [facility];  Service:  Orthopedics;  Laterality: Right;   TOTAL KNEE ARTHROPLASTY Left 12/16/2013   Procedure: LEFT TOTAL KNEE ARTHROPLASTY;  Surgeon: [name], MD;  Location: [facility];  Service: Orthopedics;  Laterality: Left;    There were no vitals filed for this visit.   Subjective Assessment - 09/28/20 1405     Subjective Had a back ache after church, did a lot of sitting over the weekend.    Pertinent History B TKA 2015, melanoma, dysphagia, GERD, HTN, LBP, renal cell carcinoma with L nephrectomy 2022    Diagnostic tests none recent    Patient Stated Goals improve balance    Currently in Pain? No/denies                               [facility] Adult PT Treatment/Exercise - 09/28/20 0001       Knee/Hip Exercises: Stretches   Piriformis Stretch Left;30 seconds;2 reps    Piriformis Stretch Limitations supine    Other Knee/Hip Stretches SKTC stretch 2x30" bilat      Knee/Hip Exercises: Aerobic   Nustep L5x22min  Knee/Hip Exercises: Supine   Bridges with Diona Foley Squeeze Strengthening;Both;10 reps    Other Supine Knee/Hip Exercises ball squeezes 10x5"      Knee/Hip Exercises: Sidelying   Clams L clams yellow TB 2x10 reps, 3" hold      Manual Therapy   Manual Therapy Soft tissue mobilization    Manual therapy comments prone    Soft tissue mobilization L piriformis, superior glutes   very ttp                     PT Short Term Goals - 09/28/20 1511       PT SHORT TERM GOAL #1   Title Patient to demonstrate independence with initial HEP.    Time 3    Period Weeks    Status Achieved    Target Date 09/22/20               PT Long Term Goals - 09/21/20 1431       PT LONG TERM GOAL #1   Title Patient to be independent with advanced HEP.    Time 6    Period Weeks    Status On-going      PT LONG TERM GOAL #2   Title Patient to demonstrate B LE strength >/=4+/5.    Time 6    Period Weeks    Status On-going      PT LONG TERM GOAL #3   Title  Patient to demonstrate atleast 10 degrees of B ankle AROM.    Time 6    Period Weeks    Status On-going      PT LONG TERM GOAL #4   Title Patient to demonstrate safe floor transfer with mod I.    Time 6    Period Weeks    Status On-going      PT LONG TERM GOAL #5   Title Patient to score >19/24 on DGI in order to decrease fall risk.    Time 6    Period Weeks    Status Achieved      PT LONG TERM GOAL #6   Title Patient to return to exercise class or fitness regimen for improved endurance.    Time 6    Period Weeks    Status On-going                   Plan - 09/28/20 1444     Clinical Impression Statement Pt still demonstrates soreness in L glutes, very ttp during manual work. Followed up MT with glute stretches and strengthening to further stabilize the hips. She reported increased muscle fatigue with clamshells but able to complete reps w/o much complaints. Due to increased muscle pain and spasm today, I educated her on using ice at home for pain. Pt responded fairly.    Personal Factors and Comorbidities Age;Comorbidity 3+;Fitness;Past/Current Experience;Time since onset of injury/illness/exacerbation    Comorbidities B TKA 2015, melanoma, dysphagia, GERD, HTN, LBP, renal cell carcinoma with L nephrectomy 2022    PT Frequency 2x / week    PT Duration 6 weeks    PT Treatment/Interventions ADLs/Self Care Home Management;Canalith Repostioning;Cryotherapy;Electrical Stimulation;Moist Heat;Balance training;Therapeutic exercise;Therapeutic activities;Functional mobility training;Stair training;Gait training;Ultrasound;Neuromuscular re-education;DME Instruction;Patient/family education;Manual techniques;Vestibular;Taping;Energy conservation;Dry needling;Passive range of motion    PT Next Visit Plan stretches for piriformis/ glutes; progress balance and LE strengthening    PT Home Exercise Plan Access Code: [email or web address]    Consulted and Agree with Plan of Care Patient  Patient will benefit from skilled therapeutic intervention in order to improve the following deficits and impairments:  Decreased endurance, Abnormal gait, Hypomobility, Decreased activity tolerance, Decreased strength, Increased fascial restricitons, Pain, Decreased balance, Difficulty walking, Improper body mechanics, Dizziness, Decreased range of motion, Impaired flexibility, Postural dysfunction  Visit Diagnosis: Unsteadiness on feet  Repeated falls  Muscle weakness (generalized)  Other abnormalities of gait and mobility  Acute bilateral low back pain without sciatica     Problem List Patient Active Problem List   Diagnosis Date Noted   Rash and nonspecific skin eruption 08/27/2020   Deficiency anemia 08/27/2020   Dyslipidemia 09/19/2019   Atherosclerotic heart disease of native coronary artery with other forms of angina pectoris ([place])    Educated about COVID-19 virus infection 03/14/2019   Tinea incognito 12/26/2018   Symptomatic PVCs 12/26/2018   Stage 3a chronic kidney disease ([place]) 12/26/2018   Degenerative lumbar disc 11/01/2016   Gastroesophageal reflux disease with esophagitis 12/10/2014   Moderate osteopenia 12/10/2014   Vitamin B12 deficiency 12/10/2014   OA (osteoarthritis) of knee 12/16/2013   Prediabetes 12/03/2013   Other screening mammogram 09/25/2012   DJD (degenerative joint disease) of knee 09/09/2011   OSA (obstructive sleep apnea) 07/29/2011   Essential hypertension, benign 07/29/2011   Pure hypercholesterolemia 07/29/2011   Routine general medical examination at a health care facility 07/29/2011    Artist Pais, PTA 09/28/2020, 3:13 PM  [facility] [address]  [place], [place], [address] Phone: [phone]   Fax:  [phone]  Name: KUUIPO ANZALDO MRN: [id number] Date of Birth: 01-Nov-1950

## 2020-09-28 NOTE — Telephone Encounter (Signed)
Follow Up:     Patient is calling about the status of her Repatha please.

## 2020-09-28 NOTE — Telephone Encounter (Signed)
Please complete prior authorization for: Kelsey Church  Name of medication, dose, and frequency Repatha 140 mg q14d, if not covered can use Praluent 150 mg q14d  Lab Orders Requested? yes  Which labs? Lipid, hepatic labs  Estimated date for labs to be scheduled after 4-5 doses of [id number]  Does patient need activated copay card? no

## 2020-09-30 MED ORDER — REPATHA SURECLICK 140 MG/ML ~~LOC~~ SOAJ: 140.0000 mg | SUBCUTANEOUS | 11 refills | Status: DC

## 2020-09-30 NOTE — Addendum Note (Signed)
Addended by: Allean Found on: 09/30/2020 02:14 PM   Modules accepted: Orders

## 2020-09-30 NOTE — Telephone Encounter (Signed)
Called and spoke w/pt and stated that were approved and rx sent. Instructed the pt to complete the fasting labs post 4th dose

## 2020-10-01 ENCOUNTER — Ambulatory Visit: Payer: Medicare Other | Admitting: Physical Therapy

## 2020-10-01 ENCOUNTER — Other Ambulatory Visit: Payer: Self-pay

## 2020-10-01 DIAGNOSIS — M6281 Muscle weakness (generalized): Secondary | ICD-10-CM | POA: Diagnosis not present

## 2020-10-01 DIAGNOSIS — Z905 Acquired absence of kidney: Secondary | ICD-10-CM | POA: Diagnosis not present

## 2020-10-01 DIAGNOSIS — R296 Repeated falls: Secondary | ICD-10-CM

## 2020-10-01 DIAGNOSIS — R42 Dizziness and giddiness: Secondary | ICD-10-CM | POA: Diagnosis not present

## 2020-10-01 DIAGNOSIS — R2689 Other abnormalities of gait and mobility: Secondary | ICD-10-CM | POA: Diagnosis not present

## 2020-10-01 DIAGNOSIS — R2681 Unsteadiness on feet: Secondary | ICD-10-CM | POA: Diagnosis not present

## 2020-10-01 DIAGNOSIS — R21 Rash and other nonspecific skin eruption: Secondary | ICD-10-CM | POA: Diagnosis not present

## 2020-10-01 DIAGNOSIS — M545 Low back pain, unspecified: Secondary | ICD-10-CM

## 2020-10-01 DIAGNOSIS — N179 Acute kidney failure, unspecified: Secondary | ICD-10-CM | POA: Diagnosis not present

## 2020-10-01 DIAGNOSIS — I2510 Atherosclerotic heart disease of native coronary artery without angina pectoris: Secondary | ICD-10-CM | POA: Diagnosis not present

## 2020-10-01 DIAGNOSIS — R1310 Dysphagia, unspecified: Secondary | ICD-10-CM | POA: Diagnosis not present

## 2020-10-01 DIAGNOSIS — D631 Anemia in chronic kidney disease: Secondary | ICD-10-CM | POA: Diagnosis not present

## 2020-10-01 DIAGNOSIS — C642 Malignant neoplasm of left kidney, except renal pelvis: Secondary | ICD-10-CM | POA: Diagnosis not present

## 2020-10-01 DIAGNOSIS — N1831 Chronic kidney disease, stage 3a: Secondary | ICD-10-CM | POA: Diagnosis not present

## 2020-10-01 NOTE — Therapy (Signed)
[facility] [place], [place], [address] Phone: [phone]   Fax:  [phone]  Physical Therapy Treatment  Patient Details  Name: Kelsey Church MRN: [id number] Date of Birth: [birth year] Referring Provider (PT): Riki Sheer, DO   Encounter Date: 10/01/2020   PT End of Session - 10/01/20 1357     Visit Number 8    Number of Visits 13    Date for PT Re-Evaluation 10/13/20    Authorization Type Medicare & BCBS    Progress Note Due on Visit 10    PT Start Time 4818    PT Stop Time 1440    PT Time Calculation (min) 43 min    Activity Tolerance Patient tolerated treatment well;Patient limited by pain    Behavior During Therapy WFL for tasks assessed/performed             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/10/13   Cancer ([place])    melanoma in 2 sights   Complication of anesthesia    "chills every evening at sundown x 2 weeks"- no fever   Depression    Dysphagia    History of shingles    NO RESIDUAL PROBLEMS   Hypertension    Pain    LOWER BACK PAIN   Sleep apnea    CPAP    Past Surgical History:  Procedure Laterality Date   APPENDECTOMY  1958   COLONOSCOPY     COLONOSCOPY W/ POLYPECTOMY     7 polyps   FOOT SURGERY Right    removal plantar wart   GALLBLADDER SURGERY  02/14/1990   INJECTION KNEE  09/2011   right    KNEE ARTHROSCOPY  1996   LEFT HEART CATH AND CORONARY ANGIOGRAPHY N/A 07/11/2019   Procedure: LEFT HEART CATH AND CORONARY ANGIOGRAPHY;  Surgeon: Belva Crome, MD;  Location: [facility];  Service: Cardiovascular;  Laterality: N/A;   MELANOMA EXCISION  1992   right chin, back   POLYPECTOMY     SALPINGOOPHORECTOMY  2009   Right   TOTAL KNEE ARTHROPLASTY Right 06/10/2013   Procedure: RIGHT TOTAL KNEE ARTHROPLASTY;  Surgeon: [name], MD;  Location: [facility];  Service:  Orthopedics;  Laterality: Right;   TOTAL KNEE ARTHROPLASTY Left 12/16/2013   Procedure: LEFT TOTAL KNEE ARTHROPLASTY;  Surgeon: [name], MD;  Location: [facility];  Service: Orthopedics;  Laterality: Left;    There were no vitals filed for this visit.   Subjective Assessment - 10/01/20 1356     Subjective Patient reports that she has had to run a lot of errands today, but otherwise doing well, no pain.   Denies falls or LOB.    Pertinent History B TKA 2015, melanoma, dysphagia, GERD, HTN, LBP, renal cell carcinoma with L nephrectomy 2022    Diagnostic tests none recent    Patient Stated Goals improve balance    Currently in Pain? No/denies                 [facility] Adult PT Treatment/Exercise - 10/01/20 0001       High Level Balance   High Level Balance Activities Side stepping;Braiding;Backward walking;Head turns;Tandem walking;Other (comment)   exercises with ball toss, foward walking with eyes closed, in hallway with SBA for safety.     Therapeutic Activites    Therapeutic Activities Other  Therapeutic Activities    Other Therapeutic Activities outdoor ambulation over grass and up and down curbs to improve community ambulation      Neuro Re-ed    Neuro Re-ed Details  Corner balance exercises tandem stance x 30 sec bil, feet together eyes closed x 30 sec, FT EC with head nods x 10.      Knee/Hip Exercises: Aerobic   Nustep L5x1min      Knee/Hip Exercises: Machines for Strengthening   Cybex Knee Flexion 2 x 15 15#                      PT Short Term Goals - 09/28/20 1511       PT SHORT TERM GOAL #1   Title Patient to demonstrate independence with initial HEP.    Time 3    Period Weeks    Status Achieved    Target Date 09/22/20               PT Long Term Goals - 09/21/20 1431       PT LONG TERM GOAL #1   Title Patient to be independent with advanced HEP.    Time 6    Period Weeks    Status On-going      PT LONG TERM GOAL #2   Title  Patient to demonstrate B LE strength >/=4+/5.    Time 6    Period Weeks    Status On-going      PT LONG TERM GOAL #3   Title Patient to demonstrate atleast 10 degrees of B ankle AROM.    Time 6    Period Weeks    Status On-going      PT LONG TERM GOAL #4   Title Patient to demonstrate safe floor transfer with mod I.    Time 6    Period Weeks    Status On-going      PT LONG TERM GOAL #5   Title Patient to score >19/24 on DGI in order to decrease fall risk.    Time 6    Period Weeks    Status Achieved      PT LONG TERM GOAL #6   Title Patient to return to exercise class or fitness regimen for improved endurance.    Time 6    Period Weeks    Status On-going                   Plan - 10/01/20 1428     Clinical Impression Statement Pt. participated in higher level balance activities, noted no difficulty with walking, side stepping and braiding with ball toss today.  Tandem walking, backwards tandem walking, and walking with eyes closed were still very difficult.  She was able to step up and down curb with good stability but reported decreased confidence in L hip strength.  Fatigued after stairs (2 flights) She would benefit from continued skilled therapy to improve endurance, hip strength and confidence.    Personal Factors and Comorbidities Age;Comorbidity 3+;Fitness;Past/Current Experience;Time since onset of injury/illness/exacerbation    Comorbidities B TKA 2015, melanoma, dysphagia, GERD, HTN, LBP, renal cell carcinoma with L nephrectomy 2022    PT Frequency 2x / week    PT Duration 6 weeks    PT Treatment/Interventions ADLs/Self Care Home Management;Canalith Repostioning;Cryotherapy;Electrical Stimulation;Moist Heat;Balance training;Therapeutic exercise;Therapeutic activities;Functional mobility training;Stair training;Gait training;Ultrasound;Neuromuscular re-education;DME Instruction;Patient/family education;Manual techniques;Vestibular;Taping;Energy conservation;Dry  needling;Passive range of motion    PT Next Visit Plan stretches for piriformis/ glutes;  progress balance and LE strengthening.  Focus on eyes closed, narrowed stance for balance.    PT Home Exercise Plan Access Code: [email or web address]    Consulted and Agree with Plan of Care Patient             Patient will benefit from skilled therapeutic intervention in order to improve the following deficits and impairments:  Decreased endurance, Abnormal gait, Hypomobility, Decreased activity tolerance, Decreased strength, Increased fascial restricitons, Pain, Decreased balance, Difficulty walking, Improper body mechanics, Dizziness, Decreased range of motion, Impaired flexibility, Postural dysfunction  Visit Diagnosis: Unsteadiness on feet  Repeated falls  Muscle weakness (generalized)  Other abnormalities of gait and mobility  Acute bilateral low back pain without sciatica     Problem List Patient Active Problem List   Diagnosis Date Noted   Rash and nonspecific skin eruption 08/27/2020   Deficiency anemia 08/27/2020   Dyslipidemia 09/19/2019   Atherosclerotic heart disease of native coronary artery with other forms of angina pectoris ([place])    Educated about COVID-19 virus infection 03/14/2019   Tinea incognito 12/26/2018   Symptomatic PVCs 12/26/2018   Stage 3a chronic kidney disease ([place]) 12/26/2018   Degenerative lumbar disc 11/01/2016   Gastroesophageal reflux disease with esophagitis 12/10/2014   Moderate osteopenia 12/10/2014   Vitamin B12 deficiency 12/10/2014   OA (osteoarthritis) of knee 12/16/2013   Prediabetes 12/03/2013   Other screening mammogram 09/25/2012   DJD (degenerative joint disease) of knee 09/09/2011   OSA (obstructive sleep apnea) 07/29/2011   Essential hypertension, benign 07/29/2011   Pure hypercholesterolemia 07/29/2011   Routine general medical examination at a health care facility 07/29/2011    Rennie Natter PT, DPT   10/01/2020, 2:46 PM  [facility] [address]  [place], [place], [address] Phone: [phone]   Fax:  [phone]  Name: Kelsey Church MRN: [id number] Date of Birth: Nov 09, 1950

## 2020-10-05 ENCOUNTER — Other Ambulatory Visit: Payer: Self-pay

## 2020-10-05 ENCOUNTER — Ambulatory Visit: Payer: Medicare Other

## 2020-10-05 DIAGNOSIS — R2689 Other abnormalities of gait and mobility: Secondary | ICD-10-CM | POA: Diagnosis not present

## 2020-10-05 DIAGNOSIS — M6281 Muscle weakness (generalized): Secondary | ICD-10-CM

## 2020-10-05 DIAGNOSIS — M545 Low back pain, unspecified: Secondary | ICD-10-CM | POA: Diagnosis not present

## 2020-10-05 DIAGNOSIS — R296 Repeated falls: Secondary | ICD-10-CM

## 2020-10-05 DIAGNOSIS — R2681 Unsteadiness on feet: Secondary | ICD-10-CM

## 2020-10-05 NOTE — Therapy (Signed)
[facility] [address]  [place], [place], [address] Phone: [phone]   Fax:  [phone]  Physical Therapy Treatment  Patient Details  Name: Kelsey Church MRN: [id number] Date of Birth: 1950/09/05 Referring Provider (PT): Riki Sheer, [place]   Encounter Date: 10/05/2020   PT End of Session - 10/05/20 1442     Visit Number 9    Number of Visits 13    Date for PT Re-Evaluation 10/13/20    Authorization Type Medicare & BCBS    Progress Note Due on Visit 10    PT Start Time 1402    PT Stop Time 1443    PT Time Calculation (min) 41 min    Activity Tolerance Patient tolerated treatment well    Behavior During Therapy WFL for tasks assessed/performed             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/10/13   Cancer ([place])    melanoma in 2 sights   Complication of anesthesia    "chills every evening at sundown x 2 weeks"- no fever   Depression    Dysphagia    History of shingles    NO RESIDUAL PROBLEMS   Hypertension    Pain    LOWER BACK PAIN   Sleep apnea    CPAP    Past Surgical History:  Procedure Laterality Date   APPENDECTOMY  1958   COLONOSCOPY     COLONOSCOPY W/ POLYPECTOMY     7 polyps   FOOT SURGERY Right    removal plantar wart   GALLBLADDER SURGERY  02/14/1990   INJECTION KNEE  09/2011   right    KNEE ARTHROSCOPY  1996   LEFT HEART CATH AND CORONARY ANGIOGRAPHY N/A 07/11/2019   Procedure: LEFT HEART CATH AND CORONARY ANGIOGRAPHY;  Surgeon: Belva Crome, MD;  Location: [facility];  Service: Cardiovascular;  Laterality: N/A;   MELANOMA EXCISION  1992   right chin, back   POLYPECTOMY     SALPINGOOPHORECTOMY  2009   Right   TOTAL KNEE ARTHROPLASTY Right 06/10/2013   Procedure: RIGHT TOTAL KNEE ARTHROPLASTY;  Surgeon: [name], MD;  Location: [facility];  Service: Orthopedics;  Laterality:  Right;   TOTAL KNEE ARTHROPLASTY Left 12/16/2013   Procedure: LEFT TOTAL KNEE ARTHROPLASTY;  Surgeon: [name], MD;  Location: [facility];  Service: Orthopedics;  Laterality: Left;    There were no vitals filed for this visit.   Subjective Assessment - 10/05/20 1403     Subjective Very busy over the weekend, no pain, no recent falls.    Pertinent History B TKA 2015, melanoma, dysphagia, GERD, HTN, LBP, renal cell carcinoma with L nephrectomy 2022    Diagnostic tests none recent    Patient Stated Goals improve balance    Currently in Pain? No/denies                [facility] PT Assessment - 10/05/20 0001       AROM   Right Ankle Dorsiflexion 5    Left Ankle Dorsiflexion -3                           [facility] Adult PT Treatment/Exercise - 10/05/20 0001       Exercises   Exercises Knee/Hip;Ankle      Knee/Hip  Exercises: Aerobic   Nustep L5x19min      Knee/Hip Exercises: Standing   Hip Abduction Stengthening;Both;10 reps;Knee straight    Abduction Limitations ext + abd; 2# weight; UE support    Hip Extension Stengthening;Both;10 reps;Knee bent    Extension Limitations 2#; UE support      Ankle Exercises: Stretches   Gastroc Stretch 30 seconds    Gastroc Stretch Limitations B on wall      Ankle Exercises: Seated   Other Seated Ankle Exercises B resisted ankle DF with red 2x10                      PT Short Term Goals - 09/28/20 1511       PT SHORT TERM GOAL #1   Title Patient to demonstrate independence with initial HEP.    Time 3    Period Weeks    Status Achieved    Target Date 09/22/20               PT Long Term Goals - 10/05/20 1419       PT LONG TERM GOAL #1   Title Patient to be independent with advanced HEP.    Time 6    Period Weeks    Status On-going      PT LONG TERM GOAL #2   Title Patient to demonstrate B LE strength >/=4+/5.    Time 6    Period Weeks    Status On-going      PT LONG TERM GOAL #3   Title  Patient to demonstrate atleast 10 degrees of B ankle AROM.    Time 6    Period Weeks    Status On-going   improvements in B ankle DF     PT LONG TERM GOAL #4   Title Patient to demonstrate safe floor transfer with mod I.    Time 6    Period Weeks    Status On-going      PT LONG TERM GOAL #5   Title Patient to score >19/24 on DGI in order to decrease fall risk.    Time 6    Period Weeks    Status Achieved      PT LONG TERM GOAL #6   Title Patient to return to exercise class or fitness regimen for improved endurance.    Time 6    Period Weeks    Status On-going                   Plan - 10/05/20 1447     Clinical Impression Statement Checked status of LTG 3 and pt demonstrated slight improvements in B ankle DF but still limited. Added exercises to HEP to help increase DF ROM. Continued with glute strengthening exercises to improve alignment of the hip/knee joints. She would benefit from more work on foot clearance exercises and hip strengthening to improve stability with gait.    Personal Factors and Comorbidities Age;Comorbidity 3+;Fitness;Past/Current Experience;Time since onset of injury/illness/exacerbation    Comorbidities B TKA 2015, melanoma, dysphagia, GERD, HTN, LBP, renal cell carcinoma with L nephrectomy 2022    PT Frequency 2x / week    PT Duration 6 weeks    PT Treatment/Interventions ADLs/Self Care Home Management;Canalith Repostioning;Cryotherapy;Electrical Stimulation;Moist Heat;Balance training;Therapeutic exercise;Therapeutic activities;Functional mobility training;Stair training;Gait training;Ultrasound;Neuromuscular re-education;DME Instruction;Patient/family education;Manual techniques;Vestibular;Taping;Energy conservation;Dry needling;Passive range of motion    PT Next Visit Plan stretches for piriformis/ glutes; progress balance and LE strengthening.  Focus on eyes closed, narrowed  stance for balance.    PT Home Exercise Plan Access Code: [email or web address],     Consulted and Agree with Plan of Care Patient             Patient will benefit from skilled therapeutic intervention in order to improve the following deficits and impairments:  Decreased endurance, Abnormal gait, Hypomobility, Decreased activity tolerance, Decreased strength, Increased fascial restricitons, Pain, Decreased balance, Difficulty walking, Improper body mechanics, Dizziness, Decreased range of motion, Impaired flexibility, Postural dysfunction  Visit Diagnosis: Unsteadiness on feet  Repeated falls  Muscle weakness (generalized)  Other abnormalities of gait and mobility  Acute bilateral low back pain without sciatica     Problem List Patient Active Problem List   Diagnosis Date Noted   Rash and nonspecific skin eruption 08/27/2020   Deficiency anemia 08/27/2020   Dyslipidemia 09/19/2019   Atherosclerotic heart disease of native coronary artery with other forms of angina pectoris ([place])    Educated about COVID-19 virus infection 03/14/2019   Tinea incognito 12/26/2018   Symptomatic PVCs 12/26/2018   Stage 3a chronic kidney disease ([place]) 12/26/2018   Degenerative lumbar disc 11/01/2016   Gastroesophageal reflux disease with esophagitis 12/10/2014   Moderate osteopenia 12/10/2014   Vitamin B12 deficiency 12/10/2014   OA (osteoarthritis) of knee 12/16/2013   Prediabetes 12/03/2013   Other screening mammogram 09/25/2012   DJD (degenerative joint disease) of knee 09/09/2011   OSA (obstructive sleep apnea) 07/29/2011   Essential hypertension, benign 07/29/2011   Pure hypercholesterolemia 07/29/2011   Routine general medical examination at a health care facility 07/29/2011    Artist Pais, PTA 10/05/2020, 3:04 PM  [facility] [address]  [place], [place], [address] Phone: [phone]   Fax:  [phone]  Name: IDANIA DESOUZA MRN: [id number] Date of Birth: 30-Aug-1950

## 2020-10-05 NOTE — Patient Instructions (Signed)
Access Code: [email or web address] URL: [email or web address] Date: 10/05/2020 Prepared by: Clarene Essex  Exercises Seated Ankle Dorsiflexion with Resistance - 2 x daily - 7 x weekly - 2 sets - 10 reps Gastroc Stretch with Foot at Wall - 2 x daily - 7 x weekly - 2 sets - 2 reps - 30 sec hold

## 2020-10-07 DIAGNOSIS — Z85528 Personal history of other malignant neoplasm of kidney: Secondary | ICD-10-CM | POA: Diagnosis not present

## 2020-10-08 ENCOUNTER — Other Ambulatory Visit: Payer: Self-pay | Admitting: Urology

## 2020-10-08 ENCOUNTER — Encounter: Payer: Self-pay | Admitting: Physical Therapy

## 2020-10-08 ENCOUNTER — Ambulatory Visit: Payer: Medicare Other | Admitting: Physical Therapy

## 2020-10-08 ENCOUNTER — Other Ambulatory Visit: Payer: Self-pay

## 2020-10-08 DIAGNOSIS — M6281 Muscle weakness (generalized): Secondary | ICD-10-CM | POA: Diagnosis not present

## 2020-10-08 DIAGNOSIS — R296 Repeated falls: Secondary | ICD-10-CM

## 2020-10-08 DIAGNOSIS — M545 Low back pain, unspecified: Secondary | ICD-10-CM | POA: Diagnosis not present

## 2020-10-08 DIAGNOSIS — R2689 Other abnormalities of gait and mobility: Secondary | ICD-10-CM | POA: Diagnosis not present

## 2020-10-08 DIAGNOSIS — R2681 Unsteadiness on feet: Secondary | ICD-10-CM

## 2020-10-08 DIAGNOSIS — Z85528 Personal history of other malignant neoplasm of kidney: Secondary | ICD-10-CM

## 2020-10-08 NOTE — Therapy (Signed)
[facility] [place], [place], [address] Phone: [phone]   Fax:  [phone]  Physical Therapy Discharge  Patient Details  Name: Kelsey Church MRN: [id number] Date of Birth: 24-Oct-1950 Referring Provider (PT): Riki Sheer, [place]   Encounter Date: 10/08/2020   PT End of Session - 10/08/20 1057     Visit Number 10    Number of Visits 13    Date for PT Re-Evaluation 10/13/20    Authorization Type Medicare & BCBS    Progress Note Due on Visit 10    PT Start Time 1015    PT Stop Time 1055    PT Time Calculation (min) 40 min    Activity Tolerance Patient tolerated treatment well    Behavior During Therapy WFL for tasks assessed/performed             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/10/13   Cancer ([place])    melanoma in 2 sights   Complication of anesthesia    "chills every evening at sundown x 2 weeks"- no fever   Depression    Dysphagia    History of shingles    NO RESIDUAL PROBLEMS   Hypertension    Pain    LOWER BACK PAIN   Sleep apnea    CPAP    Past Surgical History:  Procedure Laterality Date   APPENDECTOMY  1958   COLONOSCOPY     COLONOSCOPY W/ POLYPECTOMY     7 polyps   FOOT SURGERY Right    removal plantar wart   GALLBLADDER SURGERY  02/14/1990   INJECTION KNEE  09/2011   right    KNEE ARTHROSCOPY  1996   LEFT HEART CATH AND CORONARY ANGIOGRAPHY N/A 07/11/2019   Procedure: LEFT HEART CATH AND CORONARY ANGIOGRAPHY;  Surgeon: Belva Crome, MD;  Location: [facility];  Service: Cardiovascular;  Laterality: N/A;   MELANOMA EXCISION  1992   right chin, back   POLYPECTOMY     SALPINGOOPHORECTOMY  2009   Right   TOTAL KNEE ARTHROPLASTY Right 06/10/2013   Procedure: RIGHT TOTAL KNEE ARTHROPLASTY;  Surgeon: [name], MD;  Location: [facility];  Service: Orthopedics;  Laterality:  Right;   TOTAL KNEE ARTHROPLASTY Left 12/16/2013   Procedure: LEFT TOTAL KNEE ARTHROPLASTY;  Surgeon: [name], MD;  Location: [facility];  Service: Orthopedics;  Laterality: Left;    There were no vitals filed for this visit.   Subjective Assessment - 10/08/20 1020     Subjective Patient got up too fast after nap and had a little dizziness, but otherwise is doing very well, no falls or LOB, feels confident with balance.    Pertinent History B TKA 2015, melanoma, dysphagia, GERD, HTN, LBP, renal cell carcinoma with L nephrectomy 2022    Diagnostic tests none recent    Patient Stated Goals improve balance    Pain Score 0-No pain                 [facility] Adult PT Treatment/Exercise - 10/08/20 0001       Balance   Balance Assessed Yes      Standardized Balance Assessment   Standardized Balance Assessment Dynamic Gait Index      Dynamic Gait Index   Level Surface Normal    Change in Gait Speed Normal  Gait with Horizontal Head Turns Normal    Gait with Vertical Head Turns Normal    Gait and Pivot Turn Mild Impairment    Step Over Obstacle Normal    Step Around Obstacles Normal    Steps Mild Impairment    Total Score 22      Therapeutic Activites    Therapeutic Activities Other Therapeutic Activities    Other Therapeutic Activities review of HEP, floor to standing transfers, safety education.      Exercises   Exercises Knee/Hip;Ankle      Knee/Hip Exercises: Aerobic   Recumbent Bike L1 x 6 min                      PT Short Term Goals - 09/28/20 1511       PT SHORT TERM GOAL #1   Title Patient to demonstrate independence with initial HEP.    Time 3    Period Weeks    Status Achieved    Target Date 09/22/20               PT Long Term Goals - 10/08/20 1021       PT LONG TERM GOAL #1   Title Patient to be independent with advanced HEP.    Time 6    Period Weeks    Status Achieved   reports compliance     PT LONG TERM GOAL #2    Title Patient to demonstrate B LE strength >/=4+/5.    Time 6    Period Weeks    Status Achieved   4+/5 throughout     PT LONG TERM GOAL #3   Title Patient to demonstrate atleast 10 degrees of B ankle AROM.    Time 6    Period Weeks    Status Achieved   improvements in B ankle DF     PT LONG TERM GOAL #4   Title Patient to demonstrate safe floor transfer with mod I.    Time 6    Period Weeks    Status Achieved   able to stand up with pushing from small step.  educated on safety.     PT LONG TERM GOAL #5   Title Patient to score >19/24 on DGI in order to decrease fall risk.    Time 6    Period Weeks    Status Achieved      PT LONG TERM GOAL #6   Title Patient to return to exercise class or fitness regimen for improved endurance.    Time 6    Period Weeks    Status Partially Met   has plan to start water aerobics.  discussed different community options.                  Plan - 10/08/20 1057     Clinical Impression Statement Pt has made good progress and reports significant improvement in confidence with balance, is more aware of her surroundings.   She is concerned about getting up from floor, went over how to get up, and she was able to get up while pushing on small step, simulating surroundings with original fall, which improved her confidence significantly.  Also educated on safety after a fall, including calling 911 if need assistance and reminder that this service would be free.   She has met all goals, including DGI of 22/24 today indicating decreased fall risk.  She is ready and amenable to discharge.    Personal Factors  and Comorbidities Age;Comorbidity 3+;Fitness;Past/Current Experience;Time since onset of injury/illness/exacerbation    Comorbidities B TKA 2015, melanoma, dysphagia, GERD, HTN, LBP, renal cell carcinoma with L nephrectomy 2022    PT Frequency 2x / week    PT Duration 6 weeks    PT Treatment/Interventions ADLs/Self Care Home Management;Canalith  Repostioning;Cryotherapy;Electrical Stimulation;Moist Heat;Balance training;Therapeutic exercise;Therapeutic activities;Functional mobility training;Stair training;Gait training;Ultrasound;Neuromuscular re-education;DME Instruction;Patient/family education;Manual techniques;Vestibular;Taping;Energy conservation;Dry needling;Passive range of motion    PT Home Exercise Plan Access Code: [email or web address],    Consulted and Agree with Plan of Care Patient             Patient will benefit from skilled therapeutic intervention in order to improve the following deficits and impairments:  Decreased endurance, Abnormal gait, Hypomobility, Decreased activity tolerance, Decreased strength, Increased fascial restricitons, Pain, Decreased balance, Difficulty walking, Improper body mechanics, Dizziness, Decreased range of motion, Impaired flexibility, Postural dysfunction  Visit Diagnosis: Unsteadiness on feet  Repeated falls  Muscle weakness (generalized)  Other abnormalities of gait and mobility     Problem List Patient Active Problem List   Diagnosis Date Noted   Rash and nonspecific skin eruption 08/27/2020   Deficiency anemia 08/27/2020   Dyslipidemia 09/19/2019   Atherosclerotic heart disease of native coronary artery with other forms of angina pectoris ([place])    Educated about COVID-19 virus infection 03/14/2019   Tinea incognito 12/26/2018   Symptomatic PVCs 12/26/2018   Stage 3a chronic kidney disease ([place]) 12/26/2018   Degenerative lumbar disc 11/01/2016   Gastroesophageal reflux disease with esophagitis 12/10/2014   Moderate osteopenia 12/10/2014   Vitamin B12 deficiency 12/10/2014   OA (osteoarthritis) of knee 12/16/2013   Prediabetes 12/03/2013   Other screening mammogram 09/25/2012   DJD (degenerative joint disease) of knee 09/09/2011   OSA (obstructive sleep apnea) 07/29/2011   Essential hypertension, benign 07/29/2011   Pure hypercholesterolemia 07/29/2011   Routine general  medical examination at a health care facility 07/29/2011    Rennie Natter PT, DPT 10/08/2020, 11:59 AM  [facility] [address]  [place], [place], [address] Phone: [phone]   Fax:  [phone]  Name: Kelsey Church MRN: [id number] Date of Birth: 07-Jan-1951

## 2020-10-13 NOTE — Progress Notes (Signed)
Cardiology Office Note   Date:  10/14/2020   ID:  Kelsey Church, DOB 1950/03/15, MRN [id number]  PCP:  Janith Lima, MD  Cardiologist:   Minus Breeding, MD Referring:  Janith Lima, MD  Chief Complaint  Patient presents with   Dysphagia      History of Present Illness: Kelsey Church is a 70 y.o. female who was referred by Janith Lima, MD for evaluation of CAD.   She had an abnormal POET (Plain Old Exercise Treadmill) and had cath with results below.  She is managed medically for CAD.   She had predominantly small vessel or distal vessel disease.  She had nonobstructive disease in proximal large vessel and mid large vessels.   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  She subsequently developed renal insufficiency with a creatinine peaking at 4.7.  It was thought that this could have been her Crestor which reduced to 5 mg although she and her daughter-in-law think it was related to [facility] that was being used.  Her creatinine did subsequently come down to 3.3.  She has not had any further cardiac work-up but there were no apparent cardiac complications during her nephrectomy.   She is having difficulty swallowing.  She feels like things get stuck.  She is not having any new chest pressure, neck or arm discomfort.  She is not having any new shortness of breath  .  She has been fatigued and she started taking her metoprolol instead of 50 mg in the morning and 20 5 in the evening she started taking the 50 in the evening and 25 in the morning.  She feels a little less fatigued with this.  Have been trying to creep up on her beta-blocker to manage her coronary disease.      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/10/13   Cancer ([place])    melanoma in 2 sights   Complication of anesthesia    "chills every evening at sundown x 2 weeks"- no fever    Depression    Dysphagia    History of shingles    NO RESIDUAL PROBLEMS   Hypertension    Pain    LOWER BACK PAIN   Sleep apnea    CPAP    Past Surgical History:  Procedure Laterality Date   APPENDECTOMY  1958   COLONOSCOPY     COLONOSCOPY W/ POLYPECTOMY     7 polyps   FOOT SURGERY Right    removal plantar wart   GALLBLADDER SURGERY  02/14/1990   INJECTION KNEE  09/2011   right    KNEE ARTHROSCOPY  1996   LEFT HEART CATH AND CORONARY ANGIOGRAPHY N/A 07/11/2019   Procedure: LEFT HEART CATH AND CORONARY ANGIOGRAPHY;  Surgeon: Belva Crome, MD;  Location: [place] [facility];  Service: Cardiovascular;  Laterality: N/A;   MELANOMA EXCISION  1992   right chin, back   POLYPECTOMY     SALPINGOOPHORECTOMY  2009   Right   TOTAL KNEE ARTHROPLASTY Right 06/10/2013   Procedure: RIGHT TOTAL KNEE ARTHROPLASTY;  Surgeon: [name], MD;  Location: [facility];  Service: Orthopedics;  Laterality: Right;   TOTAL KNEE ARTHROPLASTY Left 12/16/2013   Procedure: LEFT TOTAL KNEE ARTHROPLASTY;  Surgeon: [name], MD;  Location: [facility];  Service: Orthopedics;  Laterality:  Left;     Current Outpatient Medications  Medication Sig Dispense Refill   acetaminophen (TYLENOL) 650 MG CR tablet Take 1,300 mg by mouth every 8 (eight) hours as needed for pain.      aspirin EC 81 MG tablet Take 1 tablet (81 mg total) by mouth daily. 90 tablet 3   cetirizine (ZYRTEC) 10 MG tablet Take 10 mg by mouth daily as needed for allergies.     citalopram (CELEXA) 20 MG tablet TAKE 1 TABLET AT BEDTIME 90 tablet 1   metoprolol succinate (TOPROL-XL) 25 MG 24 hr tablet Take 2 tablets (50 mg total) by mouth in the morning AND 1 tablet (25 mg total) every evening. 270 tablet 3   nitroGLYCERIN (NITROSTAT) 0.4 MG SL tablet Place 1 tablet (0.4 mg total) under the tongue every 5 (five) minutes as needed for chest pain. 25 tablet 3   ranolazine (RANEXA) 500 MG 12 hr tablet Take 1 tablet by mouth 2 (two) times daily.      rosuvastatin (CRESTOR) 10 MG tablet Take 1 tablet (10 mg total) by mouth daily. 90 tablet 3   No current facility-administered medications for this visit.    Allergies:   Atorvastatin and Rosuvastatin    ROS:  Please see the history of present illness.   Otherwise, review of systems are positive for anxiety.   All other systems are reviewed and negative.    PHYSICAL EXAM: VS:  BP 102/64   Pulse 85   Resp 20   Ht 5\' 6"  (1.676 m)   Wt 181 lb 6.4 oz (82.3 kg)   SpO2 98%   BMI 29.28 kg/m  , BMI Body mass index is 29.28 kg/m. GENERAL:  Well appearing NECK:  No jugular venous distention, waveform within normal limits, carotid upstroke brisk and symmetric, no bruits, no thyromegaly LUNGS:  Clear to auscultation bilaterally CHEST:  Unremarkable HEART:  PMI not displaced or sustained,S1 and S2 within normal limits, no S3, no S4, no clicks, no rubs, no murmurs ABD:  Flat, positive bowel sounds normal in frequency in pitch, no bruits, no rebound, no guarding, no midline pulsatile mass, no hepatomegaly, no splenomegaly EXT:  2 plus pulses throughout, no edema, no cyanosis no clubbing  EKG:  EKG is not ordered today.   Recent Labs: 08/05/2020: BUN 35; Creatinine, Ser 2.38; Potassium 4.4; Sodium 139; TSH 1.54 08/10/2020: Hemoglobin 10.9; Platelets 226.0    Lipid Panel    Component Value Date/Time   CHOL 112 04/13/2020 0000   TRIG 90 04/13/2020 0000   HDL 59 04/13/2020 0000   CHOLHDL 4 12/26/2018 0947   VLDL 39.2 12/26/2018 0947   LDLCALC 42 04/13/2020 0000    Diagnostic Dominance: Right    Wt Readings from Last 3 Encounters:  10/14/20 181 lb 6.4 oz (82.3 kg)  09/03/20 191 lb 12.8 oz (87 kg)  08/27/20 192 lb (87.1 kg)      Other studies Reviewed: Additional studies/ records that were reviewed today include: Labs Review of the above records demonstrates:  Please see elsewhere in the note.     ASSESSMENT AND PLAN:   CKD IIIa:   I reviewed the notes from her  nephrologist.  Creatinine is 2.12 and stable.  No change in therapy.  DYSLIPIDEMIA:     LDL was actually only 42 when we checked it in February.  She was supposed to start [place] but she also was given a prescription for 10 mg of Crestor which she was willing to take.  This came from her previous cardiologist in [place].  Which finally, after much discussion, came to the resolution that she would take the 10 mg of Crestor and we will check a lipid profile in 3 months.  She was made understand that she would have been on both Repatha and 5 mg of Crestor which she would rather not do.  She would not want to take higher than 10 mg of Crestor in the future   CAD: We are trying to maximize this medically managed with optimal therapy.  However, she has been intolerant of med titration.  She does agree to continue the current regimen.  DIFFICULTY SWALLOWING:  I will refer her to GI.    Current medicines are reviewed at length with the patient today.  The patient does not have concerns regarding medicines.  The following changes have been made: As above  Labs/ tests ordered today include:    Orders Placed This Encounter  Procedures   Lipid panel   LDL cholesterol, direct   Ambulatory referral to Gastroenterology       Disposition:   FU with me in 6 months   Signed, Minus Breeding, MD  10/14/2020 12:44 PM    [place]

## 2020-10-14 ENCOUNTER — Other Ambulatory Visit: Payer: Self-pay

## 2020-10-14 ENCOUNTER — Ambulatory Visit (INDEPENDENT_AMBULATORY_CARE_PROVIDER_SITE_OTHER): Payer: Medicare Other | Admitting: Cardiology

## 2020-10-14 ENCOUNTER — Encounter: Payer: Self-pay | Admitting: Cardiology

## 2020-10-14 VITALS — BP 102/64 | HR 85 | Resp 20 | Ht 66.0 in | Wt 181.4 lb

## 2020-10-14 DIAGNOSIS — E785 Hyperlipidemia, unspecified: Secondary | ICD-10-CM

## 2020-10-14 DIAGNOSIS — I2510 Atherosclerotic heart disease of native coronary artery without angina pectoris: Secondary | ICD-10-CM

## 2020-10-14 DIAGNOSIS — N1831 Chronic kidney disease, stage 3a: Secondary | ICD-10-CM | POA: Diagnosis not present

## 2020-10-14 DIAGNOSIS — R1310 Dysphagia, unspecified: Secondary | ICD-10-CM

## 2020-10-14 MED ORDER — ROSUVASTATIN CALCIUM 10 MG PO TABS: 10.0000 mg | ORAL_TABLET | Freq: Every day | ORAL | 3 refills | Status: DC

## 2020-10-14 NOTE — Patient Instructions (Signed)
Medication Instructions:   Increase  rosuvastatin 10 mg  daily  Do not take Repatha   *If you need a refill on your cardiac medications before your next appointment, please call your pharmacy*   Lab Work: Lipid- fasting   in 3 months  Direct LDL If you have labs (blood work) drawn today and your tests are completely normal, you will receive your results only by: [place] (if you have MyChart) OR A paper copy in the mail If you have any lab test that is abnormal or we need to change your treatment, we will call you to review the results.   Testing/Procedures: Not needed   Follow-Up: At [facility], you and your health needs are our priority.  As part of our continuing mission to provide you with exceptional heart care, we have created designated Provider Care Teams.  These Care Teams include your primary Cardiologist (physician) and Advanced Practice Providers (APPs -  Physician Assistants and Nurse Practitioners) who all work together to provide you with the care you need, when you need it.     Your next appointment:   6 month(s)  The format for your next appointment:   In Person  Provider:   Minus Breeding, MD  You have been referred to  [facility] - to  Other Instructions

## 2020-10-27 DIAGNOSIS — D485 Neoplasm of uncertain behavior of skin: Secondary | ICD-10-CM | POA: Diagnosis not present

## 2020-10-27 DIAGNOSIS — Z85828 Personal history of other malignant neoplasm of skin: Secondary | ICD-10-CM | POA: Diagnosis not present

## 2020-10-27 DIAGNOSIS — L988 Other specified disorders of the skin and subcutaneous tissue: Secondary | ICD-10-CM | POA: Diagnosis not present

## 2020-11-02 ENCOUNTER — Other Ambulatory Visit: Payer: Self-pay

## 2020-11-02 ENCOUNTER — Telehealth: Payer: Self-pay | Admitting: Internal Medicine

## 2020-11-02 ENCOUNTER — Ambulatory Visit (INDEPENDENT_AMBULATORY_CARE_PROVIDER_SITE_OTHER): Payer: Medicare Other | Admitting: Pulmonary Disease

## 2020-11-02 ENCOUNTER — Encounter: Payer: Self-pay | Admitting: Pulmonary Disease

## 2020-11-02 DIAGNOSIS — G4733 Obstructive sleep apnea (adult) (pediatric): Secondary | ICD-10-CM | POA: Diagnosis not present

## 2020-11-02 DIAGNOSIS — I2510 Atherosclerotic heart disease of native coronary artery without angina pectoris: Secondary | ICD-10-CM | POA: Diagnosis not present

## 2020-11-02 DIAGNOSIS — K2100 Gastro-esophageal reflux disease with esophagitis, without bleeding: Secondary | ICD-10-CM | POA: Diagnosis not present

## 2020-11-02 MED ORDER — PANTOPRAZOLE SODIUM 40 MG PO TBEC
40.0000 mg | DELAYED_RELEASE_TABLET | Freq: Every day | ORAL | 0 refills | Status: DC
Start: 2020-11-02 — End: 2020-11-25

## 2020-11-02 NOTE — Assessment & Plan Note (Signed)
We will give her prescription for a month of Protonix until she is able to establish with GI

## 2020-11-02 NOTE — Telephone Encounter (Signed)
Ok for appt Will need to see APP given limited clinic availability for me at this time [facility]

## 2020-11-02 NOTE — Progress Notes (Signed)
Subjective:    Patient ID: Kelsey Church, female    DOB: [birth year], 70 y.o.   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 Last seen 02/2017.  She has lost 20 pounds since then due to reflux symptoms and is having difficulty establishing with her gastroenterologist. Moderate OSA was diagnosed in 2013 and she has been maintained on CPAP with nasal pillows since then, DME is [employer] she has had good results and reported improvement in her daytime somnolence and fatigue..  Interim history since her last visit in 2019 -she had COVID infection in 09/2018 and developed persistent shortness of breath she underwent cardiology evaluation which showed left proximal LAD disease and small vessel CAD.  She moved to [place] where CT angiogram chest was performed, this picked up the left renal carcinoma 7 cm and she underwent nephrectomy in February 2022, developed CKD back to [facility] and is established with renal and urology here, last BUN/creatinine was 35/2.4   Sleepiness score is 8, bedtime is around 10 PM, sleep latency is minimal, she reports reflux symptoms and sleeps on 2 pillows .  She is about to get a bed that raises head and neck.  She missed a few nights in her CPAP and felt really tired the next day.  She reports occasional nocturnal awakenings due to reflux symptoms and denies nocturia. There is no history suggestive of cataplexy, sleep paralysis or parasomnias   Significant tests/ events reviewed  2013 PSG  moderate OSa with AHi 22/h & lowest desatn to 87% corrected by CPAP 9 cm with swift nasal pillows     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/10/13   Cancer ([place])    melanoma in 2 sights   Complication of anesthesia    "chills every evening at sundown x 2 weeks"- no fever   Depression    Dysphagia    History of shingles    NO RESIDUAL  PROBLEMS   Hypertension    Pain    LOWER BACK PAIN   Sleep apnea    CPAP    .psh  Allergies  Allergen Reactions   Atorvastatin    Rosuvastatin     Creatinine elevation    .soc   Family History  Problem Relation Age of Onset   Heart disease Father    Heart attack Father 86       53   Hypertension Father    Osteoporosis Mother    Breast cancer Mother    Cancer Maternal Grandmother        Breast and Uterine Cancer   Breast cancer Maternal Grandmother    Thyroid cancer Maternal Grandmother    Hypertension Sister    Diabetes Sister    Colon polyps Sister    Colon cancer Neg Hx    Rectal cancer Neg Hx    Stomach cancer Neg Hx    Esophageal cancer Neg Hx    Allergic rhinitis Neg Hx    Asthma Neg Hx    Eczema Neg Hx    Urticaria Neg Hx      Review of Systems Shortness of breath with activity Nonproductive cough Irregular heartbeat Acid heartburn, indigestion and loss of appetite Weight loss of 20 pounds in the last 3 years Difficulty swallowing Sneezing Anxiety and depression    Objective:   Physical Exam  Gen. Pleasant, well-nourished, in  no distress, normal affect ENT - no pallor,icterus, no post nasal drip Neck: No JVD, no thyromegaly, no carotid bruits Lungs: no use of accessory muscles, no dullness to percussion, clear without rales or rhonchi  Cardiovascular: Rhythm regular, heart sounds  normal, no murmurs or gallops, no peripheral edema Abdomen: soft and non-tender, no hepatosplenomegaly, BS normal. Musculoskeletal: No deformities, no cyanosis or clubbing Neuro:  alert, non focal       Assessment & Plan:

## 2020-11-02 NOTE — Assessment & Plan Note (Signed)
CPAP download was reviewed which shows excellent control of events on 9 cm with good compliance more than 8 hours every night.  She has an occasional night where AHI reaches 10/hour. This may be a positional component. It has been 9 years since her original study and would like to reassess with a home sleep test.  But she reports that she has several things going on and would like to defer this for now. We will prescribe her a new auto CPAP machine 8 to 12 cm with nasal pillows and this should improve the positional component  Weight loss encouraged, compliance with goal of at least 4-6 hrs every night is the expectation. Advised against medications with sedative side effects Cautioned against driving when sleepy - understanding that sleepiness will vary on a day to day basis

## 2020-11-02 NOTE — Patient Instructions (Signed)
Protonix 40 mg daily x  76month until you see GI Rx for new autoCPAP 8-12 cm Call us if you want to proceed with home sleep study

## 2020-11-02 NOTE — Telephone Encounter (Signed)
Hi Dr. Hilarie Fredrickson, we have received a referral from patient's PCP for dysphagia. Patient had colonoscopy last April in [place]. She saw you last time in 2016 and stated that she would like to continue her GI care with you. Records have been received and they will be sent to you for review. Please advise on scheduling. Thank you.

## 2020-11-02 NOTE — Addendum Note (Signed)
Addended by: Darliss Ridgel on: 11/02/2020 11:02 AM   Modules accepted: Orders

## 2020-11-04 ENCOUNTER — Encounter: Payer: Self-pay | Admitting: Physician Assistant

## 2020-11-05 NOTE — Telephone Encounter (Signed)
Pt scheduled on 10/21 with APP.

## 2020-11-10 ENCOUNTER — Ambulatory Visit
Admission: RE | Admit: 2020-11-10 | Discharge: 2020-11-10 | Disposition: A | Discharge status: Home / Self Care | Payer: Medicare Other | Source: Ambulatory Visit | Attending: Urology | Admitting: Urology

## 2020-11-10 ENCOUNTER — Other Ambulatory Visit: Payer: Self-pay | Admitting: Urology

## 2020-11-10 ENCOUNTER — Other Ambulatory Visit: Payer: Self-pay

## 2020-11-10 DIAGNOSIS — Z9889 Other specified postprocedural states: Secondary | ICD-10-CM | POA: Diagnosis not present

## 2020-11-10 DIAGNOSIS — Z85528 Personal history of other malignant neoplasm of kidney: Secondary | ICD-10-CM

## 2020-11-10 DIAGNOSIS — Z85820 Personal history of malignant melanoma of skin: Secondary | ICD-10-CM | POA: Diagnosis not present

## 2020-11-10 DIAGNOSIS — Z905 Acquired absence of kidney: Secondary | ICD-10-CM | POA: Diagnosis not present

## 2020-11-10 DIAGNOSIS — C649 Malignant neoplasm of unspecified kidney, except renal pelvis: Secondary | ICD-10-CM | POA: Diagnosis not present

## 2020-11-10 IMAGING — CR DG CHEST 2V
2 series · 2 of 2 positions shown · non-contrast
Comparison: [DATE].

CLINICAL DATA: History of renal carcinoma.

EXAM:
CHEST - 2 VIEW

[w chest pa]
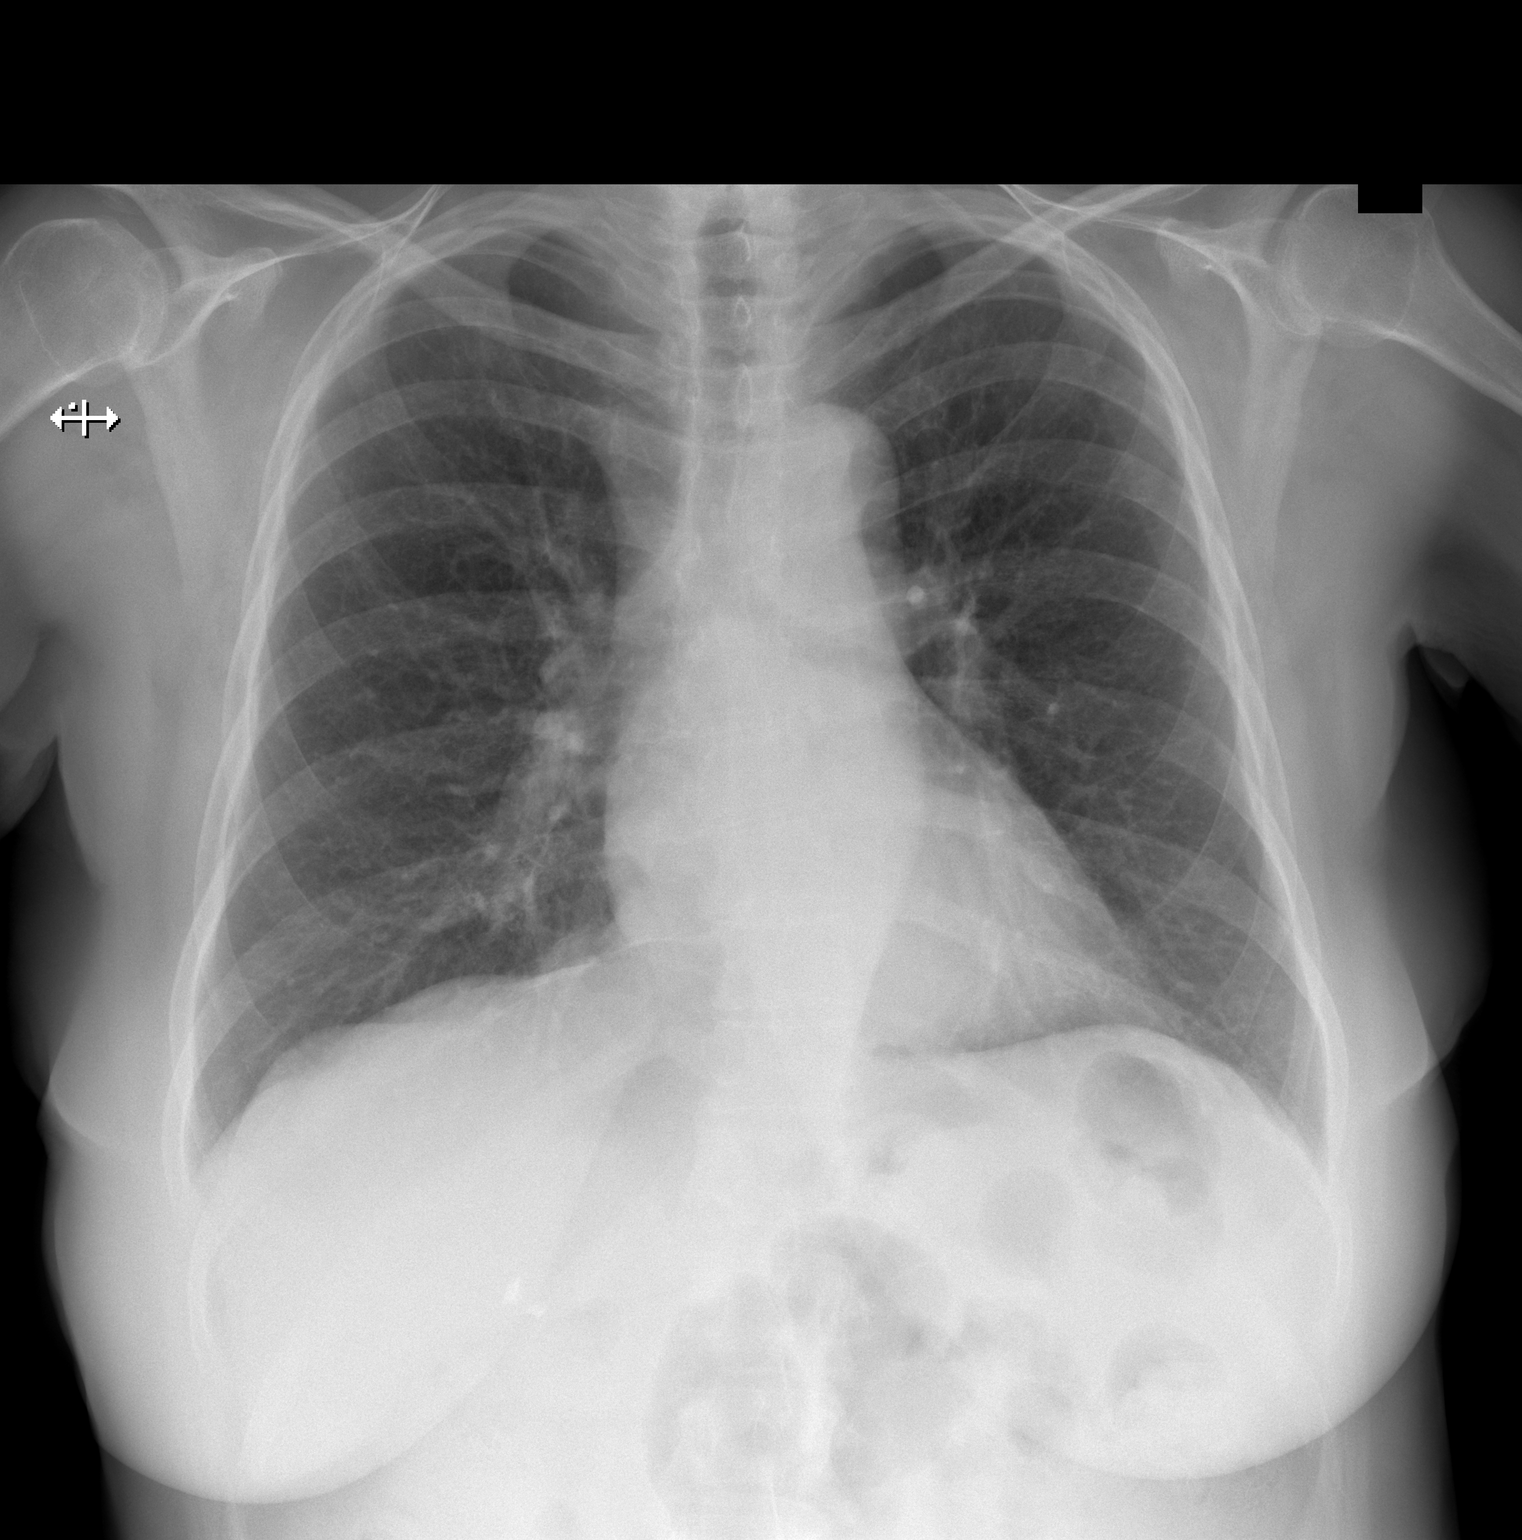

[w chest lat]
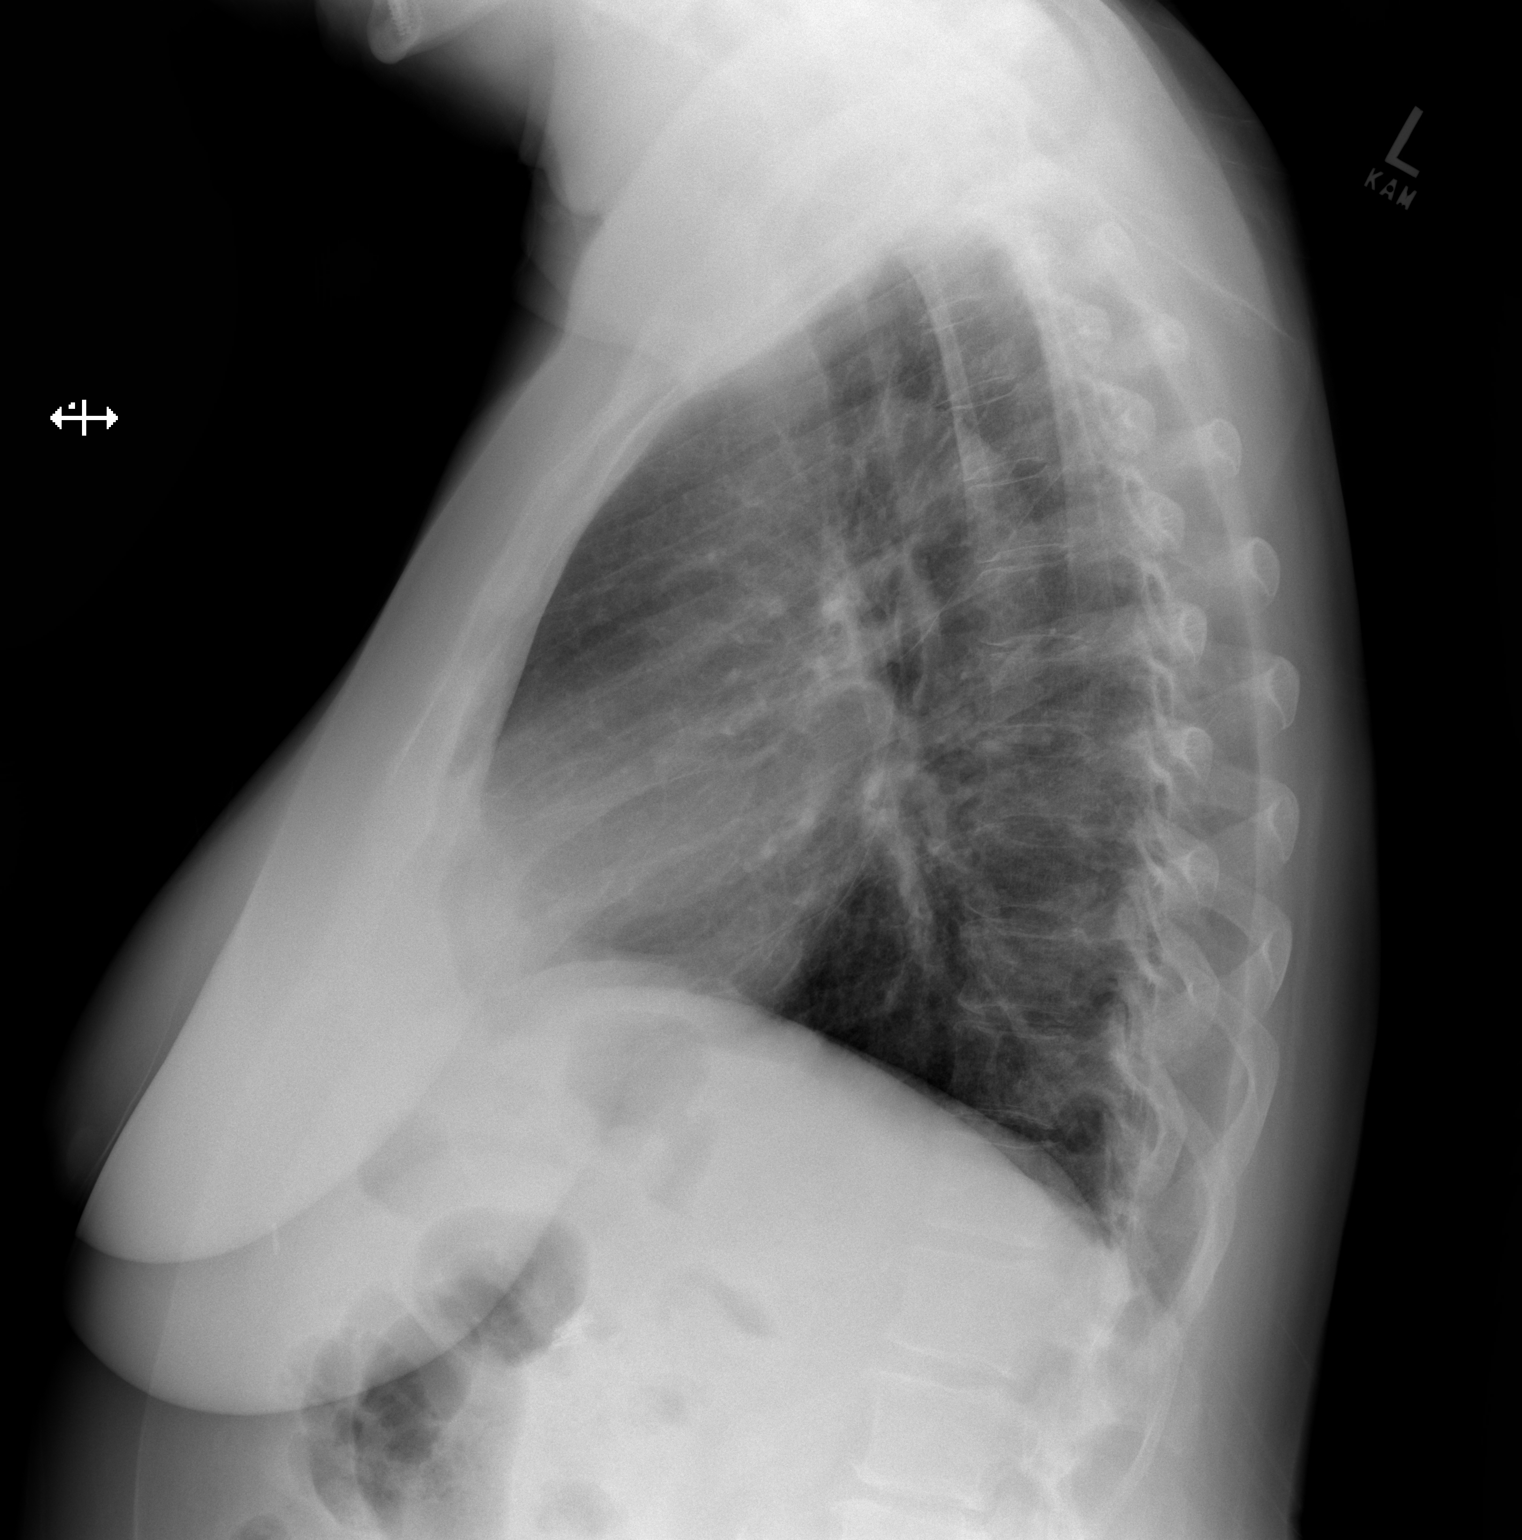

[2 of 2 positions shown; findings below may reference images not displayed]

FINDINGS: The heart size and mediastinal contours are within normal limits.
Both lungs are clear. The visualized skeletal structures are
unremarkable.
IMPRESSION: No active cardiopulmonary disease.

## 2020-11-10 MED ORDER — GADOBENATE DIMEGLUMINE 529 MG/ML IV SOLN
17.0000 mL | Freq: Once | INTRAVENOUS | Status: AC | PRN
  Administered 2020-11-10: 17 mL via INTRAVENOUS

## 2020-11-25 ENCOUNTER — Other Ambulatory Visit: Payer: Self-pay | Admitting: Pulmonary Disease

## 2020-11-30 DIAGNOSIS — N1831 Chronic kidney disease, stage 3a: Secondary | ICD-10-CM | POA: Diagnosis not present

## 2020-12-04 ENCOUNTER — Ambulatory Visit (INDEPENDENT_AMBULATORY_CARE_PROVIDER_SITE_OTHER): Payer: Medicare Other | Admitting: Physician Assistant

## 2020-12-04 ENCOUNTER — Encounter: Payer: Self-pay | Admitting: Physician Assistant

## 2020-12-04 VITALS — BP 106/58 | HR 64 | Ht 66.0 in | Wt 173.4 lb

## 2020-12-04 DIAGNOSIS — I2510 Atherosclerotic heart disease of native coronary artery without angina pectoris: Secondary | ICD-10-CM | POA: Diagnosis not present

## 2020-12-04 DIAGNOSIS — R1310 Dysphagia, unspecified: Secondary | ICD-10-CM | POA: Diagnosis not present

## 2020-12-04 DIAGNOSIS — R110 Nausea: Secondary | ICD-10-CM | POA: Diagnosis not present

## 2020-12-04 MED ORDER — PANTOPRAZOLE SODIUM 40 MG PO TBEC: 40.0000 mg | DELAYED_RELEASE_TABLET | Freq: Two times a day (BID) | ORAL | 3 refills | Status: DC

## 2020-12-04 NOTE — Patient Instructions (Addendum)
If you are age 70 or older, your body mass index should be between 23-30. Your Body mass index is 27.98 kg/m. If this is out of the aforementioned range listed, please consider follow up with your Primary Care Provider.  The [facility] providers would like to encourage you to use [facility] to communicate with providers for non-urgent requests or questions.  Due to long hold times on the telephone, sending your provider a message by [facility] may be faster and more efficient way to get a response. Please allow 48 business hours for a response.  Please remember that this is for non-urgent requests/questions.  IMAGING: You will be contacted by [facility] (Your caller ID will indicate phone # [phone]) in the next 7 days to schedule your Barium Swallow Test. If you have not heard from them within 7 business days, please call [facility] at [phone] to follow up on the status of your appointment.    MEDICATION: We have sent the following medication to your pharmacy for you to pick up at your convenience: Pantoprazole 40 MG tablet, take 1 before breakfast and dinner.  It was great seeing you today! Thank you for entrusting me with your care and choosing [facility].  Ellouise Newer, [place]

## 2020-12-04 NOTE — Progress Notes (Signed)
Chief Complaint: Nausea, dysphagia and weight loss  HPI:    Kelsey Church is a 70-year-old Caucasian female with a past medical history as listed below including dysphagia, known to Dr. Hilarie Fredrickson, who was referred to me by Kelsey Lima, MD for a complaint of dysphagia, nausea and weight loss.    01/19/2015 EGD for dysphagia was normal with empiric dilation with a savory dilator.    06/03/2020 colonoscopy in [place] for change in bowel habits with prominent IC valve and otherwise normal.  Biopsy showed benign colonic mucosa with underlying lobulated adipose tissue consistent with submucosal lipoma.  No colitis.    Today, the patient describes that in August she started having a feeling of nausea when she would get up and eventually would often vomit a yellow bile looking material.  Along with this noticed that she had a change in her taste and that nothing "tasted right".  Then when even trying to drink liquids she felt like these got stuck on their way down, even water and then "something would open up and it hurt" and then it would go down.  Tells me that certain things seem to be slightly easier than others to eat and due to this has had to maintain a mostly soft food diet which she is typically able to get down.  Denies any reflux.  Associated symptoms include hiccuping and belching.  Initially started taking over-the-counter Prevacid for 14 days which seemed to help but did not completely relieve symptoms.  She was then prescribed Pantoprazole 40 mg once a day over the past month by her pulmonologist which helped a little bit more but again did not completely get rid of symptoms.  Also describes some early satiety and a weight loss of around 20 pounds over the past 3 months or so.    Denies fever, chills, blood in her stool or symptoms that awaken her from sleep.  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/10/13   Cancer ([place])     melanoma in 2 sights   Complication of anesthesia    "chills every evening at sundown x 2 weeks"- no fever   Depression    Dysphagia    History of shingles    NO RESIDUAL PROBLEMS   Hypertension    Pain    LOWER BACK PAIN   Sleep apnea    CPAP    Past Surgical History:  Procedure Laterality Date   APPENDECTOMY  1958   COLONOSCOPY     COLONOSCOPY W/ POLYPECTOMY     7 polyps   FOOT SURGERY Right    removal plantar wart   GALLBLADDER SURGERY  02/14/1990   INJECTION KNEE  09/2011   right    KNEE ARTHROSCOPY  1996   LEFT HEART CATH AND CORONARY ANGIOGRAPHY N/A 07/11/2019   Procedure: LEFT HEART CATH AND CORONARY ANGIOGRAPHY;  Surgeon: Belva Crome, MD;  Location: [facility];  Service: Cardiovascular;  Laterality: N/A;   MELANOMA EXCISION  1992   right chin, back   POLYPECTOMY     SALPINGOOPHORECTOMY  2009   Right   TOTAL KNEE ARTHROPLASTY Right 06/10/2013   Procedure: RIGHT TOTAL KNEE ARTHROPLASTY;  Surgeon: [name], MD;  Location: [facility];  Service: Orthopedics;  Laterality: Right;   TOTAL KNEE ARTHROPLASTY Left 12/16/2013   Procedure: LEFT TOTAL KNEE ARTHROPLASTY;  Surgeon: [name], MD;  Location: [facility];  Service: Orthopedics;  Laterality: Left;    Current Outpatient Medications  Medication Sig Dispense Refill   acetaminophen (TYLENOL) 650 MG CR tablet Take 1,300 mg by mouth every 8 (eight) hours as needed for pain.      aspirin EC 81 MG tablet Take 1 tablet (81 mg total) by mouth daily. 90 tablet 3   cetirizine (ZYRTEC) 10 MG tablet Take 10 mg by mouth daily as needed for allergies.     citalopram (CELEXA) 20 MG tablet TAKE 1 TABLET AT BEDTIME 90 tablet 1   metoprolol succinate (TOPROL-XL) 25 MG 24 hr tablet Take 2 tablets (50 mg total) by mouth in the morning AND 1 tablet (25 mg total) every evening. 270 tablet 3   nitroGLYCERIN (NITROSTAT) 0.4 MG SL tablet Place 1 tablet (0.4 mg total) under the tongue every 5 (five) minutes as needed for chest  pain. 25 tablet 3   pantoprazole (PROTONIX) 40 MG tablet TAKE 1 TABLET BY MOUTH EVERY DAY 30 tablet 0   ranolazine (RANEXA) 500 MG 12 hr tablet Take 1 tablet by mouth 2 (two) times daily.     rosuvastatin (CRESTOR) 10 MG tablet Take 1 tablet (10 mg total) by mouth daily. 90 tablet 3   No current facility-administered medications for this visit.    Allergies as of 12/04/2020 - Review Complete 12/04/2020  Allergen Reaction Noted   Atorvastatin  09/03/2020   Rosuvastatin  09/03/2020    Family History  Problem Relation Age of Onset   Heart disease Father    Heart attack Father 47       53   Hypertension Father    Osteoporosis Mother    Breast cancer Mother    Cancer Maternal Grandmother        Breast and Uterine Cancer   Breast cancer Maternal Grandmother    Thyroid cancer Maternal Grandmother    Hypertension Sister    Diabetes Sister    Colon polyps Sister    Colon cancer Neg Hx    Rectal cancer Neg Hx    Stomach cancer Neg Hx    Esophageal cancer Neg Hx    Allergic rhinitis Neg Hx    Asthma Neg Hx    Eczema Neg Hx    Urticaria Neg Hx     Social History   Socioeconomic History   Marital status: Widowed    Spouse name: Not on file   Number of children: 1   Years of education: Not on file   Highest education level: Not on file  Occupational History   Occupation: CMA    Employer:   Tobacco Use   Smoking status: Never   Smokeless tobacco: Never  Vaping Use   Vaping Use: Never used  Substance and Sexual Activity   Alcohol use: No    Alcohol/week: 0.0 standard drinks   Drug use: No   Sexual activity: Not Currently    Comment: 1st intercourse- 18, partners- 1,   Other Topics Concern   Not on file  Social History Narrative   Lives alone.  Widow.  One son.    Caffienated drinks-no   Seat belt use often-yes   Regular Exercise-yes   Smoke alarm in the home-yes   Firearms/guns in the home-no   History of physical abuse-no            Social  Determinants of Health   Financial Resource Strain: Not on file  Food Insecurity: Not on file  Transportation Needs: Not on  file  Physical Activity: Not on file  Stress: Not on file  Social Connections: Not on file  Intimate Partner Violence: Not on file    Review of Systems:    Constitutional: No fever or chills Skin: No rash Cardiovascular: No chest pain Respiratory: No SOB  Gastrointestinal: See HPI and otherwise negative Genitourinary: No dysuria  Neurological: No headache, dizziness or syncope Musculoskeletal: No new muscle or joint pain Hematologic: No bleeding  Psychiatric: No history of depression or anxiety   Physical Exam:  Vital signs: BP (!) 106/58   Pulse 64   Ht 5\' 6"  (1.676 m)   Wt 173 lb 6 oz (78.6 kg)   SpO2 98%   BMI 27.98 kg/m    Constitutional:   Pleasant Elderly Caucasian female appears to be in NAD, Well developed, Well nourished, alert and cooperative Head:  Normocephalic and atraumatic. Eyes:   PEERL, EOMI. No icterus. Conjunctiva pink. Ears:  Normal auditory acuity. Neck:  Supple Throat: Oral cavity and pharynx without inflammation, swelling or lesion.  Respiratory: Respirations even and unlabored. Lungs clear to auscultation bilaterally.   No wheezes, crackles, or rhonchi.  Cardiovascular: Normal S1, S2. No MRG. Regular rate and rhythm. No peripheral edema, cyanosis or pallor.  Gastrointestinal:  Soft, nondistended, mild epigastric ttp, No rebound or guarding. Normal bowel sounds. No appreciable masses or hepatomegaly. Rectal:  Not performed.  Msk:  Symmetrical without gross deformities. Without edema, no deformity or joint abnormality.  Neurologic:  Alert and  oriented x4;  grossly normal neurologically.  Skin:   Dry and intact without significant lesions or rashes. Psychiatric: Demonstrates good judgement and reason without abnormal affect or behaviors.  RELEVANT LABS AND IMAGING: CBC    Component Value Date/Time   WBC 6.5 08/10/2020  1004   RBC 3.70 (L) 08/10/2020 1004   HGB 10.9 (L) 08/10/2020 1004   HGB 13.0 07/08/2019 1503   HCT 32.5 (L) 08/10/2020 1004   HCT 39.4 07/08/2019 1503   PLT 226.0 08/10/2020 1004   PLT 311 07/08/2019 1503   MCV 87.8 08/10/2020 1004   MCV 83 07/08/2019 1503   MCH 27.3 07/08/2019 1503   MCH 28.8 12/27/2013 1120   MCHC 33.5 08/10/2020 1004   RDW 16.3 (H) 08/10/2020 1004   RDW 13.4 07/08/2019 1503   LYMPHSABS 1.8 12/26/2018 0947   MONOABS 0.6 12/26/2018 0947   EOSABS 0.3 12/26/2018 0947   BASOSABS 0.1 12/26/2018 0947    CMP     Component Value Date/Time   NA 139 08/05/2020 1624   NA 141 07/08/2019 1502   K 4.4 08/05/2020 1624   CL 103 08/05/2020 1624   CO2 26 08/05/2020 1624   GLUCOSE 102 (H) 08/05/2020 1624   BUN 35 (H) 08/05/2020 1624   BUN 21 07/08/2019 1502   CREATININE 2.38 (H) 08/05/2020 1624   CALCIUM 9.7 08/05/2020 1624   PROT 7.1 12/26/2018 0947   ALBUMIN 4.2 08/05/2020 1624   AST 17 12/26/2018 0947   ALT 22 12/26/2018 0947   ALKPHOS 73 12/26/2018 0947   BILITOT 0.4 12/26/2018 0947   GFRNONAA 48 (L) 07/08/2019 1502   GFRAA 55 (L) 07/08/2019 1502    Assessment: 1.  Nausea: Typically in the mornings, some better after using Pantoprazole 40 mg once a day; consider gastritis/GERD 2.  Dysphagia: With above, even to liquids at times; consider dysmotility versus stricture versus esophageal spasm  Plan: 1.  Ordered a barium esophagram with tablet for further evaluation of dysphagia.  If this is normal  or shows a stricture may need further evaluation with EGD.  Discussed this with the patient today. 2.  Increased Pantoprazole to 40 mg twice daily, 30-60 minutes before breakfast and again before dinner prescribed #60 with 5 refills. 3.  Patient to follow in clinic with me in 6-8 weeks.  Again if no better we will consider an EGD.  Ellouise Newer, PA-C [facility] 12/04/2020, 2:09 PM  Cc: Kelsey Lima, MD

## 2020-12-07 ENCOUNTER — Ambulatory Visit (INDEPENDENT_AMBULATORY_CARE_PROVIDER_SITE_OTHER): Payer: Medicare Other | Admitting: Internal Medicine

## 2020-12-07 ENCOUNTER — Other Ambulatory Visit: Payer: Self-pay

## 2020-12-07 ENCOUNTER — Encounter: Payer: Self-pay | Admitting: Internal Medicine

## 2020-12-07 VITALS — BP 118/74 | HR 66 | Temp 98.4°F | Ht 66.0 in | Wt 175.0 lb

## 2020-12-07 DIAGNOSIS — D631 Anemia in chronic kidney disease: Secondary | ICD-10-CM | POA: Diagnosis not present

## 2020-12-07 DIAGNOSIS — R42 Dizziness and giddiness: Secondary | ICD-10-CM | POA: Diagnosis not present

## 2020-12-07 DIAGNOSIS — I10 Essential (primary) hypertension: Secondary | ICD-10-CM | POA: Diagnosis not present

## 2020-12-07 DIAGNOSIS — N184 Chronic kidney disease, stage 4 (severe): Secondary | ICD-10-CM | POA: Diagnosis not present

## 2020-12-07 DIAGNOSIS — Z23 Encounter for immunization: Secondary | ICD-10-CM

## 2020-12-07 DIAGNOSIS — R1319 Other dysphagia: Secondary | ICD-10-CM

## 2020-12-07 DIAGNOSIS — Z905 Acquired absence of kidney: Secondary | ICD-10-CM | POA: Diagnosis not present

## 2020-12-07 DIAGNOSIS — E785 Hyperlipidemia, unspecified: Secondary | ICD-10-CM | POA: Insufficient documentation

## 2020-12-07 DIAGNOSIS — C642 Malignant neoplasm of left kidney, except renal pelvis: Secondary | ICD-10-CM | POA: Diagnosis not present

## 2020-12-07 DIAGNOSIS — I2510 Atherosclerotic heart disease of native coronary artery without angina pectoris: Secondary | ICD-10-CM | POA: Diagnosis not present

## 2020-12-07 DIAGNOSIS — R1310 Dysphagia, unspecified: Secondary | ICD-10-CM | POA: Diagnosis not present

## 2020-12-07 DIAGNOSIS — E538 Deficiency of other specified B group vitamins: Secondary | ICD-10-CM

## 2020-12-07 DIAGNOSIS — R21 Rash and other nonspecific skin eruption: Secondary | ICD-10-CM | POA: Diagnosis not present

## 2020-12-07 LAB — LIPID PANEL
Cholesterol: 119 mg/dL (ref 0–200)
HDL: 46.9 mg/dL (ref >39.00)
LDL Cholesterol: 41 mg/dL (ref 0–99)
NonHDL: 71.73
Total CHOL/HDL Ratio: 3
Triglycerides: 155 mg/dL — ABNORMAL HIGH (ref 0.0–149.0)
VLDL: 31 mg/dL (ref 0.0–40.0)

## 2020-12-07 NOTE — Progress Notes (Signed)
Subjective:  Patient ID: Kelsey Church, female    DOB: 27-Feb-1950  Age: 70 y.o. MRN: [id number]  CC: Hyperlipidemia  This visit occurred during the SARS-CoV-2 public health emergency.  Safety protocols were in place, including screening questions prior to the visit, additional usage of staff PPE, and extensive cleaning of exam room while observing appropriate contact time as indicated for disinfecting solutions.    HPI Kelsey Church presents for f/up -  She denies CP, DOE, SOB, edema, fatigue. She saw nephrology earlier today.  Outpatient Medications Prior to Visit  Medication Sig Dispense Refill   acetaminophen (TYLENOL) 650 MG CR tablet Take 1,300 mg by mouth every 8 (eight) hours as needed for pain.      aspirin EC 81 MG tablet Take 1 tablet (81 mg total) by mouth daily. 90 tablet 3   cetirizine (ZYRTEC) 10 MG tablet Take 10 mg by mouth daily as needed for allergies.     citalopram (CELEXA) 20 MG tablet TAKE 1 TABLET AT BEDTIME 90 tablet 1   metoprolol succinate (TOPROL-XL) 25 MG 24 hr tablet Take 2 tablets (50 mg total) by mouth in the morning AND 1 tablet (25 mg total) every evening. 270 tablet 3   nitroGLYCERIN (NITROSTAT) 0.4 MG SL tablet Place 1 tablet (0.4 mg total) under the tongue every 5 (five) minutes as needed for chest pain. 25 tablet 3   pantoprazole (PROTONIX) 40 MG tablet Take 1 tablet (40 mg total) by mouth 2 (two) times daily before a meal. 60 tablet 3   ranolazine (RANEXA) 500 MG 12 hr tablet Take 1 tablet by mouth 2 (two) times daily.     rosuvastatin (CRESTOR) 10 MG tablet Take 1 tablet (10 mg total) by mouth daily. 90 tablet 3   No facility-administered medications prior to visit.    ROS Review of Systems  Constitutional:  Negative for diaphoresis and fatigue.  HENT:  Positive for trouble swallowing.   Eyes: Negative.   Respiratory:  Negative for cough, chest tightness, shortness of breath and wheezing.   Cardiovascular:  Negative for chest pain,  palpitations and leg swelling.  Gastrointestinal: Negative.  Negative for abdominal pain, constipation, diarrhea, nausea and vomiting.       ++early satiety  Endocrine: Negative.   Genitourinary: Negative.  Negative for difficulty urinating.  Musculoskeletal: Negative.  Negative for myalgias.  Skin: Negative.   Neurological: Negative.  Negative for dizziness, weakness and headaches.  Hematological:  Negative for adenopathy. Does not bruise/bleed easily.  Psychiatric/Behavioral: Negative.     Objective:  BP 118/74 (BP Location: Left Arm, Patient Position: Sitting, Cuff Size: Large)   Pulse 66   Temp 98.4 F (36.9 C) (Oral)   Ht 5\' 6"  (1.676 m)   Wt 175 lb (79.4 kg)   SpO2 97%   BMI 28.25 kg/m   BP Readings from Last 3 Encounters:  12/07/20 118/74  12/04/20 (!) 106/58  11/02/20 102/60    Wt Readings from Last 3 Encounters:  12/07/20 175 lb (79.4 kg)  12/04/20 173 lb 6 oz (78.6 kg)  11/02/20 178 lb 9.6 oz (81 kg)    Physical Exam Vitals reviewed.  Constitutional:      Appearance: Normal appearance.  HENT:     Nose: Nose normal.     Mouth/Throat:     Mouth: Mucous membranes are moist.  Eyes:     Conjunctiva/sclera: Conjunctivae normal.  Cardiovascular:     Rate and Rhythm: Normal rate and regular rhythm.  Pulses: Normal pulses.     Heart sounds: No murmur heard. Pulmonary:     Effort: Pulmonary effort is normal.     Breath sounds: No stridor. No wheezing, rhonchi or rales.  Abdominal:     General: Abdomen is flat. Bowel sounds are normal. There is no distension.     Palpations: Abdomen is soft. There is no hepatomegaly, splenomegaly or mass.  Musculoskeletal:        General: Normal range of motion.     Cervical back: Neck supple.     Right lower leg: No edema.  Lymphadenopathy:     Cervical: No cervical adenopathy.  Skin:    General: Skin is warm.  Neurological:     General: No focal deficit present.     Mental Status: She is alert.  Psychiatric:         Mood and Affect: Mood normal.        Behavior: Behavior normal.    Lab Results  Component Value Date   WBC 6.5 08/10/2020   HGB 10.9 (L) 08/10/2020   HCT 32.5 (L) 08/10/2020   PLT 226.0 08/10/2020   GLUCOSE 102 (H) 08/05/2020   CHOL 119 12/07/2020   TRIG 155.0 (H) 12/07/2020   HDL 46.90 12/07/2020   LDLCALC 41 12/07/2020   ALT 22 12/26/2018   AST 17 12/26/2018   NA 139 08/05/2020   K 4.4 08/05/2020   CL 103 08/05/2020   CREATININE 2.38 (H) 08/05/2020   BUN 35 (H) 08/05/2020   CO2 26 08/05/2020   TSH 1.54 08/05/2020   INR 1.17 12/27/2013   HGBA1C 5.9 08/27/2020    DG Chest 2 View  Result Date: 11/10/2020 CLINICAL DATA:  History of renal carcinoma. EXAM: CHEST - 2 VIEW COMPARISON:  September 10, 2018. FINDINGS: The heart size and mediastinal contours are within normal limits. Both lungs are clear. The visualized skeletal structures are unremarkable. IMPRESSION: No active cardiopulmonary disease. Electronically Signed   By: Marijo Conception M.D.   On: 11/10/2020 16:13   MR ABDOMEN WWO CONTRAST  Result Date: 11/10/2020 CLINICAL DATA:  Follow-up renal cell carcinoma. Previous left nephrectomy. Personal history of melanoma. EXAM: MRI ABDOMEN WITHOUT AND WITH CONTRAST TECHNIQUE: Multiplanar multisequence MR imaging of the abdomen was performed both before and after the administration of intravenous contrast. CONTRAST:  2mL MULTIHANCE GADOBENATE DIMEGLUMINE 529 MG/ML IV SOLN COMPARISON:  None. FINDINGS: Lower chest: No acute findings. Hepatobiliary: No hepatic masses identified. Prior cholecystectomy. No evidence of biliary obstruction. Pancreas: No mass or inflammatory changes. No evidence of pancreatic ductal dilatation or pancreas divisum. Spleen:  Within normal limits in size and appearance. Adrenals/Urinary Tract: Postop changes are seen from previous left nephrectomy. No evidence of recurrent soft tissue mass. The right kidney and both adrenal glands are normal appearance. No masses  identified. No evidence of hydronephrosis. Stomach/Bowel: Visualized portion unremarkable. Vascular/Lymphatic: No pathologically enlarged lymph nodes identified. No acute vascular findings. Other:  None. Musculoskeletal:  No suspicious bone lesions identified. IMPRESSION: Previous left nephrectomy. No evidence of recurrent or metastatic carcinoma. Electronically Signed   By: Marlaine Hind M.D.   On: 11/10/2020 14:37    Assessment & Plan:   Linea was seen today for hyperlipidemia.  Diagnoses and all orders for this visit:  Flu vaccine need -     Flu Vaccine QUAD High Dose(Fluad)  Vitamin B12 deficiency  Hyperlipidemia LDL goal <70- LDL goal achieved. Doing well on the statin  -     Lipid panel; Future -  Lipid panel  Essential hypertension, benign- Her BP is adequately well controlled.  Esophageal dysphagia- She is seeing GI about this  I am having Bailey H. Macgregor "Kelsey Church" maintain her acetaminophen, aspirin EC, nitroGLYCERIN, cetirizine, citalopram, ranolazine, metoprolol succinate, rosuvastatin, and pantoprazole.  No orders of the defined types were placed in this encounter.    Follow-up: Return in about 6 months (around 06/07/2021).  [name], MD

## 2020-12-07 NOTE — Patient Instructions (Signed)

## 2020-12-08 DIAGNOSIS — R1310 Dysphagia, unspecified: Secondary | ICD-10-CM | POA: Insufficient documentation

## 2020-12-09 NOTE — Progress Notes (Signed)
Addendum: Reviewed and agree with assessment and management plan. Renie Stelmach M, MD  

## 2020-12-10 ENCOUNTER — Telehealth: Payer: Self-pay | Admitting: *Deleted

## 2020-12-10 NOTE — Telephone Encounter (Signed)
Spoke with pt, we received a office note from [facility] and they feel some of her kidney issues are due to her statin. She reports dr Jenny Reichmann checked her cholesterol recently and her LDL was 41. Explained to patient that the kidney doctor feels the statin is causing her kidney issues and the best thing to do is stop it. She voiced understanding and is also not going to start the repatha. Aware will make dr hochrein aware.

## 2020-12-23 ENCOUNTER — Other Ambulatory Visit: Payer: Self-pay

## 2020-12-23 ENCOUNTER — Ambulatory Visit (HOSPITAL_COMMUNITY)
Admission: RE | Admit: 2020-12-23 | Discharge: 2020-12-23 | Disposition: A | Discharge status: Home / Self Care | Payer: Medicare Other | Source: Ambulatory Visit | Attending: Physician Assistant | Admitting: Physician Assistant

## 2020-12-23 DIAGNOSIS — R1310 Dysphagia, unspecified: Secondary | ICD-10-CM | POA: Insufficient documentation

## 2020-12-23 DIAGNOSIS — R110 Nausea: Secondary | ICD-10-CM | POA: Insufficient documentation

## 2020-12-23 IMAGING — RF DG ESOPHAGUS
6 of 7 series · 16 of 22 positions shown · non-contrast
Comparison: None.

CLINICAL DATA: Dysphagia.

EXAM:
ESOPHOGRAM / BARIUM SWALLOW / BARIUM TABLET STUDY
TECHNIQUE: Combined double contrast and single contrast examination performed
using effervescent crystals, thick barium liquid, and thin barium
liquid. The patient was observed with fluoroscopy swallowing a 13 mm
barium sulphate tablet.
FLUOROSCOPY TIME:  Radiation Exposure Index (if provided by the
fluoroscopic device): 23.1 mGy.

[Series 1: cp_standard · 0.51mm/px · 3 of 120 frames shown (1 of 5)]
[frame 19/120]
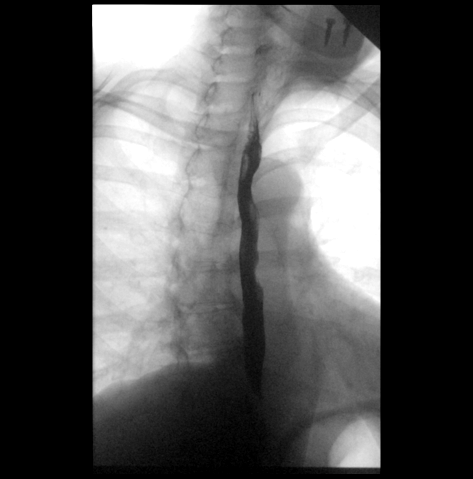
[frame 61/120]
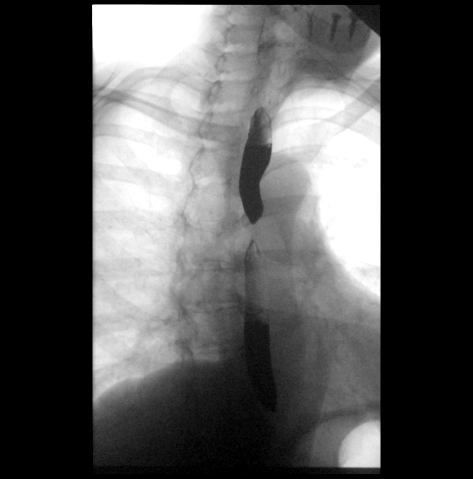
[frame 103/120]
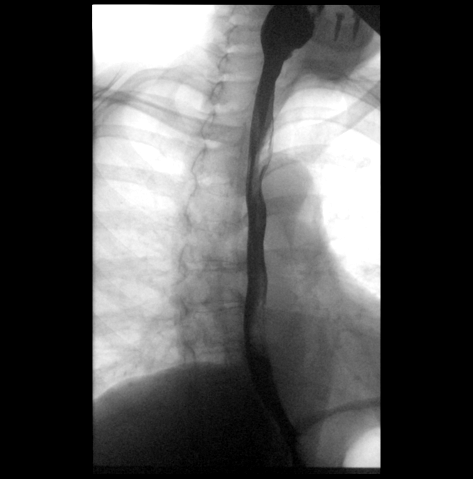

[Series 2: cp_standard · 0.51mm/px · 3 of 257 frames shown (2 of 5)]
[frame 6/257]
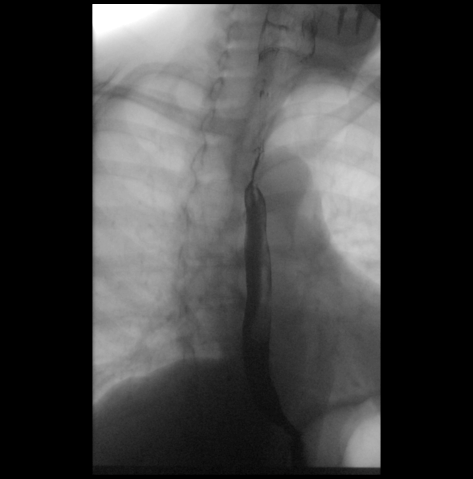
[frame 129/257]
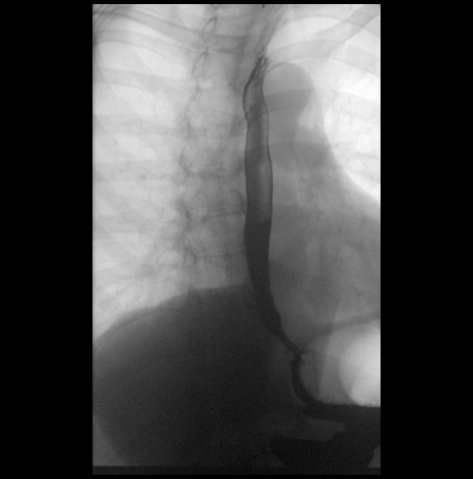
[frame 219/257]
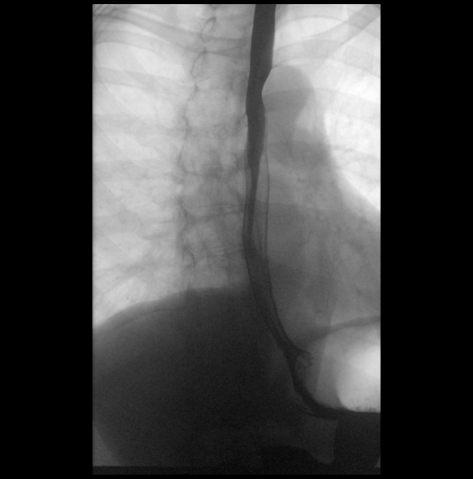

[Series 3: cp_standard · 0.53mm/px · 3 of 137 frames shown (3 of 5)]
[frame 7/137]
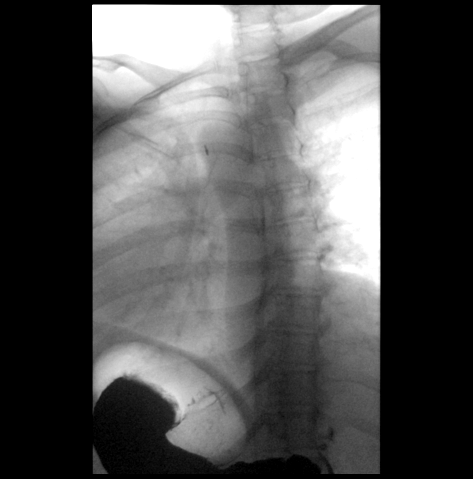
[frame 69/137]
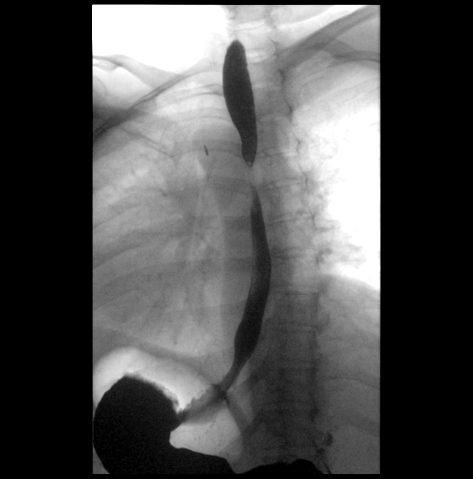
[frame 117/137]
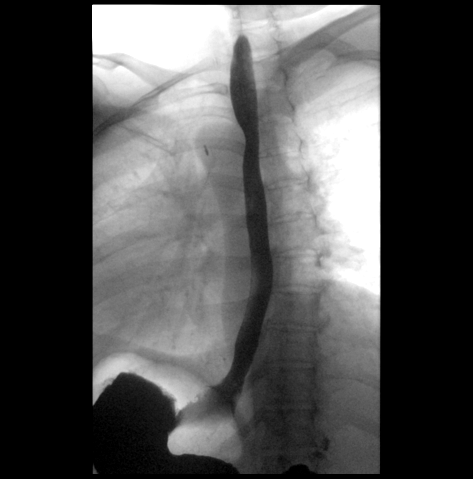

[Series 4: cp_standard · 0.53mm/px · 3 of 76 frames shown (4 of 5)]
[frame 39/76]
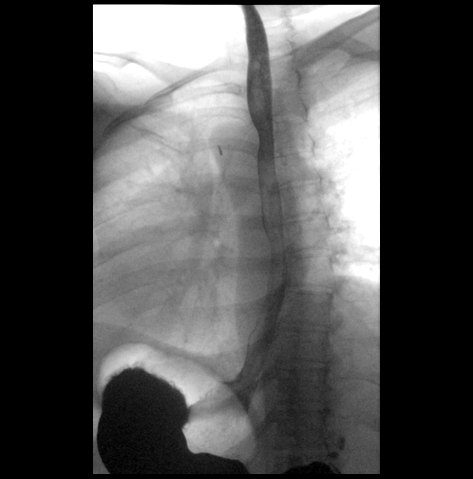
[frame 40/76]
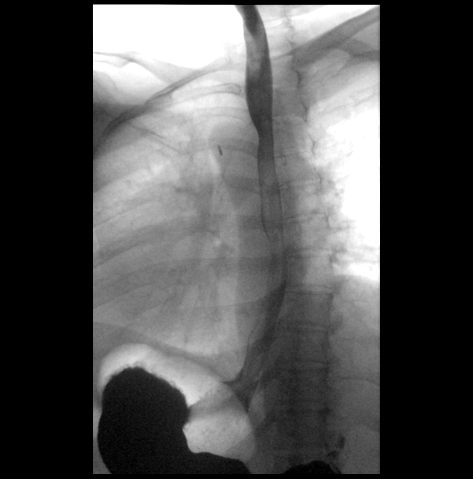
[frame 65/76]
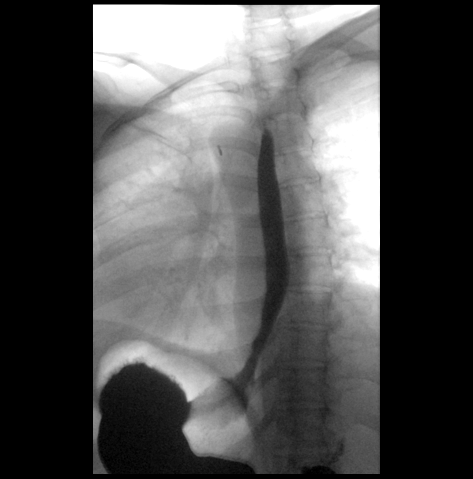

[Series 5: cp_standard · 0.53mm/px · 3 of 89 frames shown (5 of 5)]
[frame 45/89]
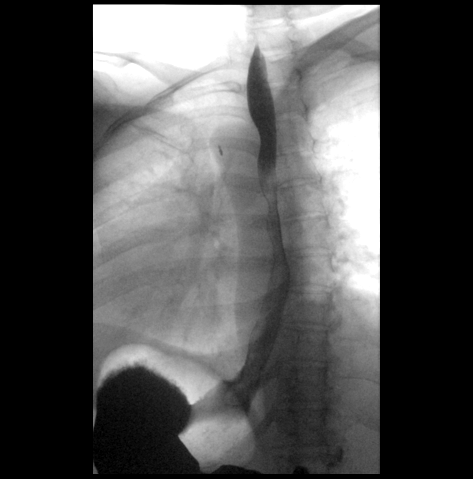
[frame 63/89]
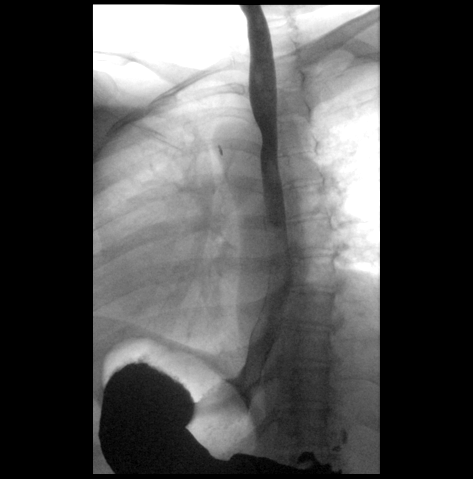
[frame 76/89]
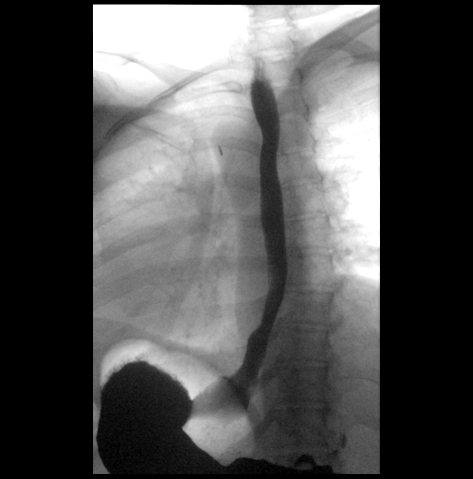

[Series 7: fluoro_barium 2fps_bw · 0.18mm/px · 1 of 1 slices shown]
[im 1/1]
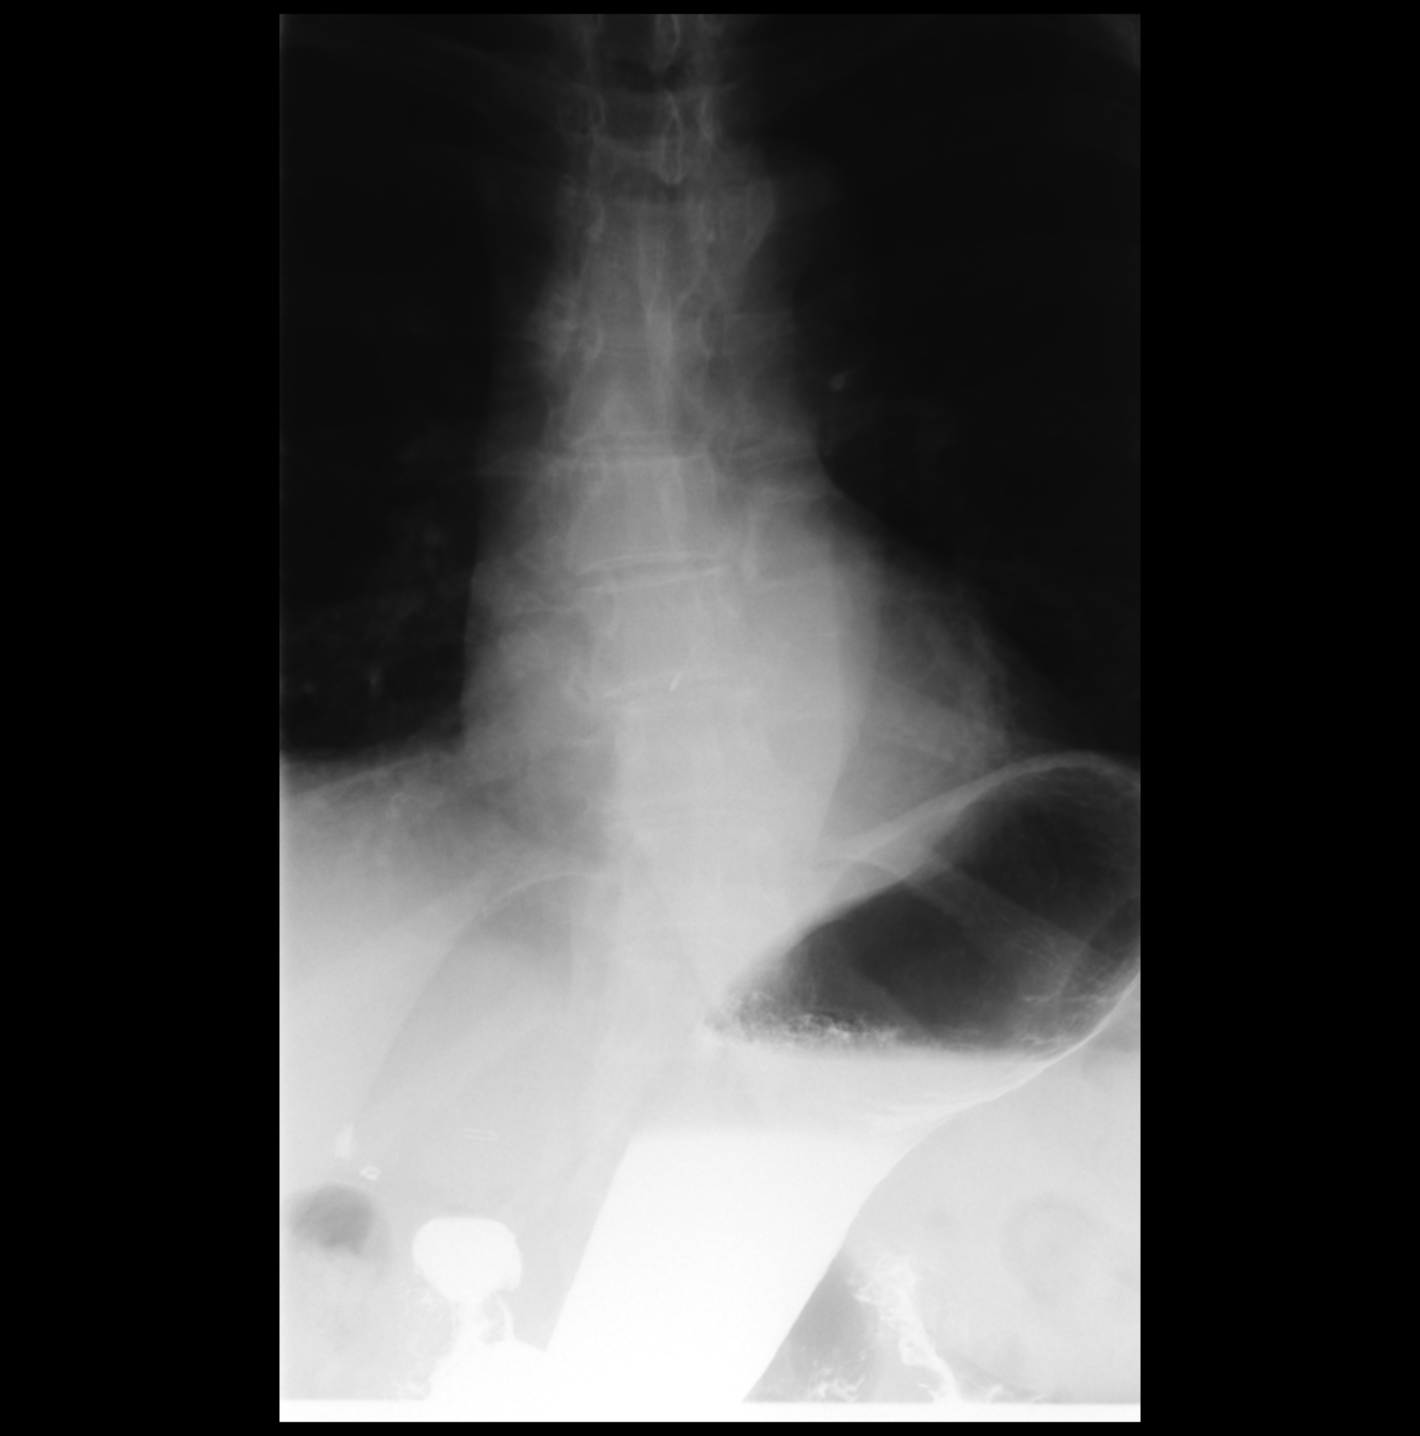

[16 of 22 positions shown; findings below may reference images not displayed]

FINDINGS: No mass or stricture is noted in the esophagus. No hiatal hernia or
reflux is noted. Barium tablet was delayed in the mid esophagus for
approximately 1 minute, before clearing into the distal esophagus
and stomach.
IMPRESSION: No definite abnormality seen in the esophagus.

## 2020-12-24 ENCOUNTER — Other Ambulatory Visit: Payer: Self-pay

## 2020-12-24 ENCOUNTER — Telehealth: Payer: Self-pay | Admitting: Physician Assistant

## 2020-12-24 DIAGNOSIS — R110 Nausea: Secondary | ICD-10-CM

## 2020-12-24 DIAGNOSIS — R1310 Dysphagia, unspecified: Secondary | ICD-10-CM

## 2020-12-24 NOTE — Telephone Encounter (Signed)
Patients daughter is calling to follow up on patients care however she is not listed on the DPR she also had the patient on the phone to give permission on her behalf. They are wanting to know why it has taken so long for the patient to get any procedures done also stated the patients health is getting worst so they are highly concerned.   Patient also requested a DPR form to be faxed to update it on file. Sent to fax [phone].

## 2020-12-24 NOTE — Telephone Encounter (Signed)
Returned call to patient, pt 3-way called her daughter-in-law. She had concerns about patient's overall health and that she has been dealing with her symptoms for several months. Dr. Hilarie Fredrickson had an opening for tomorrow afternoon. Pt's EGD appt has been moved to Friday, 12/25/20 at 3:30 pm. Pt is aware that she will need to arrive on the 4th floor by 2:30 pm with a care partner. Pt and Alyssa verbalized understanding and had no other concerns at the end of the call. Pt will adjust the date on her instructions that were previously sent.   Secure staff message sent to [facility] in regards to appt date change for authorization.

## 2020-12-25 ENCOUNTER — Encounter: Payer: Self-pay | Admitting: Internal Medicine

## 2020-12-25 ENCOUNTER — Other Ambulatory Visit: Payer: Self-pay

## 2020-12-25 ENCOUNTER — Ambulatory Visit (AMBULATORY_SURGERY_CENTER): Payer: Medicare Other | Admitting: Internal Medicine

## 2020-12-25 VITALS — BP 107/66 | HR 58 | Temp 96.6°F | Resp 9 | Ht 66.0 in | Wt 173.0 lb

## 2020-12-25 DIAGNOSIS — I2510 Atherosclerotic heart disease of native coronary artery without angina pectoris: Secondary | ICD-10-CM | POA: Diagnosis not present

## 2020-12-25 DIAGNOSIS — Q400 Congenital hypertrophic pyloric stenosis: Secondary | ICD-10-CM

## 2020-12-25 DIAGNOSIS — G4733 Obstructive sleep apnea (adult) (pediatric): Secondary | ICD-10-CM | POA: Diagnosis not present

## 2020-12-25 DIAGNOSIS — K2970 Gastritis, unspecified, without bleeding: Secondary | ICD-10-CM | POA: Diagnosis not present

## 2020-12-25 DIAGNOSIS — K634 Enteroptosis: Secondary | ICD-10-CM

## 2020-12-25 DIAGNOSIS — K2950 Unspecified chronic gastritis without bleeding: Secondary | ICD-10-CM | POA: Diagnosis not present

## 2020-12-25 DIAGNOSIS — K222 Esophageal obstruction: Secondary | ICD-10-CM | POA: Diagnosis not present

## 2020-12-25 DIAGNOSIS — K317 Polyp of stomach and duodenum: Secondary | ICD-10-CM

## 2020-12-25 DIAGNOSIS — I10 Essential (primary) hypertension: Secondary | ICD-10-CM | POA: Diagnosis not present

## 2020-12-25 DIAGNOSIS — R1310 Dysphagia, unspecified: Secondary | ICD-10-CM | POA: Diagnosis not present

## 2020-12-25 DIAGNOSIS — K219 Gastro-esophageal reflux disease without esophagitis: Secondary | ICD-10-CM | POA: Diagnosis not present

## 2020-12-25 DIAGNOSIS — K2990 Gastroduodenitis, unspecified, without bleeding: Secondary | ICD-10-CM

## 2020-12-25 MED ORDER — SODIUM CHLORIDE 0.9 % IV SOLN: 500.0000 mL | Freq: Once | INTRAVENOUS | Status: DC

## 2020-12-25 NOTE — Patient Instructions (Signed)
Read all of the handouts given to you by your recover room nurse.  Eat a bland soft diet for this evening.   YOU HAD AN ENDOSCOPIC PROCEDURE TODAY AT [facility]:   Refer to the procedure report that was given to you for any specific questions about what was found during the examination.  If the procedure report does not answer your questions, please call your gastroenterologist to clarify.  If you requested that your care partner not be given the details of your procedure findings, then the procedure report has been included in a sealed envelope for you to review at your convenience later.  YOU SHOULD EXPECT: Some feelings of bloating in the abdomen. Passage of more gas than usual.  Walking can help get rid of the air that was put into your GI tract during the procedure and reduce the bloating.   Please Note:  You might notice some irritation and congestion in your nose or some drainage.  This is from the oxygen used during your procedure.  There is no need for concern and it should clear up in a day or so.  SYMPTOMS TO REPORT IMMEDIATELY:   Following upper endoscopy (EGD)  Vomiting of blood or coffee ground material  New chest pain or pain under the shoulder blades  Painful or persistently difficult swallowing  New shortness of breath  Fever of 100F or higher  Black, tarry-looking stools  For urgent or emergent issues, a gastroenterologist can be reached at any hour by calling [phone]. Do not use MyChart messaging for urgent concerns.    DIET:  We do recommend a small meal at first, but then you may proceed to your regular diet.  Drink plenty of fluids but you should avoid alcoholic beverages for 24 hours.  ACTIVITY:  You should plan to take it easy for the rest of today and you should NOT DRIVE or use heavy machinery until tomorrow (because of the sedation medicines used during the test).    FOLLOW UP: Our staff will call the number listed on your records 48-72  hours following your procedure to check on you and address any questions or concerns that you may have regarding the information given to you following your procedure. If we do not reach you, we will leave a message.  We will attempt to reach you two times.  During this call, we will ask if you have developed any symptoms of COVID 19. If you develop any symptoms (ie: fever, flu-like symptoms, shortness of breath, cough etc.) before then, please call [phone].  If you test positive for Covid 19 in the 2 weeks post procedure, please call and report this information to [place].    If any biopsies were taken you will be contacted by phone or by letter within the next 1-3 weeks.  Please call us at [phone] if you have not heard about the biopsies in 3 weeks.    SIGNATURES/CONFIDENTIALITY: You and/or your care partner have signed paperwork which will be entered into your electronic medical record.  These signatures attest to the fact that that the information above on your After Visit Summary has been reviewed and is understood.  Full responsibility of the confidentiality of this discharge information lies with you and/or your care-partner.

## 2020-12-25 NOTE — Progress Notes (Signed)
Report to PACU, RN, vss, BBS= Clear.  

## 2020-12-25 NOTE — Progress Notes (Signed)
Pt's states no medical or surgical changes since previsit or office visit. VS by  

## 2020-12-25 NOTE — Progress Notes (Signed)
See office note dated 12/04/2020 for details Patient presents for endoscopy to evaluate esophageal dysphagia and nausea Barium esophagram without overt abnormalities a barium tablet did remain in the midesophagus for nearly a minute  No significant change in medical history since time of office visit She remains appropriate for [facility] EGD with possible dilation  The nature of the procedure, as well as the risks, benefits, and alternatives were carefully and thoroughly reviewed with the patient. Ample time for discussion and questions allowed. The patient understood, was satisfied, and agreed to proceed.

## 2020-12-25 NOTE — Op Note (Signed)
[facility] Patient Name: Kelsey Church Procedure Date: 12/25/2020 3:25 PM MRN: [id number] Endoscopist: Jerene Bears , MD Age: 70 Referring MD:  Date of Birth: 1950-03-19 Gender: Female Account #: [id number] Procedure:                Upper GI endoscopy Indications:              Dysphagia, Weight loss Medicines:                Monitored Anesthesia Care Procedure:                Pre-Anesthesia Assessment:                           - Prior to the procedure, a History and Physical                            was performed, and patient medications and                            allergies were reviewed. The patient's tolerance of                            previous anesthesia was also reviewed. The risks                            and benefits of the procedure and the sedation                            options and risks were discussed with the patient.                            All questions were answered, and informed consent                            was obtained. Prior Anticoagulants: The patient has                            taken no previous anticoagulant or antiplatelet                            agents. ASA Grade Assessment: III - A patient with                            severe systemic disease. After reviewing the risks                            and benefits, the patient was deemed in                            satisfactory condition to undergo the procedure.                           After obtaining informed consent, the endoscope was  passed under direct vision. Throughout the                            procedure, the patient's blood pressure, pulse, and                            oxygen saturations were monitored continuously. The                            GIF [id number] #[id number] was introduced through the                            mouth, and advanced to the second part of duodenum.                            The upper GI endoscopy was  accomplished without                            difficulty. The patient tolerated the procedure                            well. Scope In: Scope Out: Findings:                 One benign-appearing, intrinsic mild                            (non-circumferential scarring) stenosis was found                            39 cm from the incisors. This stenosis measured                            less than one cm (in length). The stenosis was                            traversed. A TTS dilator was passed through the                            scope. Dilation with a 15-16.5-18 mm balloon                            dilator was performed to 16.5 mm. The dilation site                            was examined and showed moderate mucosal disruption.                           A small hiatal hernia was present.                           A single 8 mm sessile polyp was found on the lesser  curvature of the gastric antrum. This was biopsied                            with a cold forceps for histology.                           Mild inflammation was found in the gastric antrum.                            Biopsies were taken with a cold forceps for                            histology and Helicobacter pylori testing.                           The examined duodenum was normal. Biopsies were                            taken with a cold forceps for histology. Complications:            No immediate complications. Estimated Blood Loss:     Estimated blood loss was minimal. Impression:               - Benign-appearing esophageal stenosis. Dilated to                            16.5 mm with balloon.                           - Small hiatal hernia.                           - A single gastric polyp. Biopsied.                           - Gastritis. Biopsied.                           - Normal examined duodenum. Biopsied. Recommendation:           - Patient has a contact number available for                             emergencies. The signs and symptoms of potential                            delayed complications were discussed with the                            patient. Return to normal activities tomorrow.                            Written discharge instructions were provided to the                            patient.                           -  Resume previous diet.                           - Continue present medications including                            pantoprazole.                           - Await pathology results.                           - If persistent dysphagia consider esophageal                            manometry. Jerene Bears, MD 12/25/2020 3:52:18 PM This report has been signed electronically.

## 2020-12-25 NOTE — Progress Notes (Signed)
Called to room to assist during endoscopic procedure.  Patient ID and intended procedure confirmed with present staff. Received instructions for my participation in the procedure from the performing physician.  

## 2020-12-29 ENCOUNTER — Telehealth: Payer: Self-pay

## 2020-12-29 NOTE — Telephone Encounter (Signed)
I am glad to hear she has had a favorable response to esophageal dilation and is not having regurgitation and vomiting I will await path results and then be in contact with pt Thanks JMP

## 2020-12-29 NOTE — Telephone Encounter (Signed)
  Follow up Call-  Call back number 12/25/2020  Post procedure Call Back phone  # [phone]  Permission to leave phone message No  Some recent data might be hidden     Patient questions:  Do you have a fever, pain , or abdominal swelling? No. Pain Score  0 *  Have you tolerated food without any problems? No.Pt. reports she is able to cautiously eat room temperature foods, and hot tea.  If she drinks anything cold, it "catches" in her throat.  Have you been able to return to your normal activities? Yes.    Do you have any questions about your discharge instructions: Diet   Yes.   Medications  No. Follow up visit  No.  Do you have questions or concerns about your Care? Yes.  Told pt. To continue with what is working for her now, and we will await her pathology results, and that her procedure report indicated further measures that can be taken should her dysphagia persist.    Actions: * If pain score is 4 or above: Physician/ provider Notified : Zenovia Jarred, MD.

## 2020-12-29 NOTE — Telephone Encounter (Signed)
First attempt f/u call, no answer

## 2020-12-30 ENCOUNTER — Encounter: Payer: Self-pay | Admitting: Internal Medicine

## 2021-01-05 ENCOUNTER — Encounter: Payer: Medicare Other | Admitting: Internal Medicine

## 2021-01-14 ENCOUNTER — Ambulatory Visit (INDEPENDENT_AMBULATORY_CARE_PROVIDER_SITE_OTHER): Payer: Medicare Other | Admitting: Physician Assistant

## 2021-01-14 ENCOUNTER — Encounter: Payer: Self-pay | Admitting: Physician Assistant

## 2021-01-14 VITALS — BP 114/60 | HR 76 | Ht 66.0 in | Wt 167.8 lb

## 2021-01-14 DIAGNOSIS — R112 Nausea with vomiting, unspecified: Secondary | ICD-10-CM

## 2021-01-14 DIAGNOSIS — R634 Abnormal weight loss: Secondary | ICD-10-CM

## 2021-01-14 DIAGNOSIS — I2510 Atherosclerotic heart disease of native coronary artery without angina pectoris: Secondary | ICD-10-CM | POA: Diagnosis not present

## 2021-01-14 DIAGNOSIS — R1310 Dysphagia, unspecified: Secondary | ICD-10-CM

## 2021-01-14 MED ORDER — PANTOPRAZOLE SODIUM 40 MG PO TBEC: 40.0000 mg | DELAYED_RELEASE_TABLET | Freq: Two times a day (BID) | ORAL | 3 refills | Status: DC

## 2021-01-14 NOTE — Telephone Encounter (Signed)
error 

## 2021-01-14 NOTE — Progress Notes (Signed)
Chief Complaint: Follow-up dysphagia  HPI:    Kelsey Church is a 70 year old female with a past medical history as listed below, known to Dr. Hilarie Fredrickson, who returns to clinic today for follow-up of her dysphagia.    01/19/2015 EGD for dysphagia was normal with empiric dilation with a savory dilator.    06/03/2020 colonoscopy in [place] for change in bowel habits with prominent IC valve and otherwise normal.  Biopsy showed benign colonic mucosa with underlying lobulated adipose tissue consistent with submucosal lipoma.  No colitis.    12/04/2020 office visit with me during which she describes symptoms which had started in August with vomiting and then dysphagia type symptoms.  Also described hiccuping and belching.  This was some better on Pantoprazole 40 mg once daily over the past month.  Described weight loss around 20 pounds with this.  At that time ordered a barium esophagram with tablet for further evaluation and increase Pantoprazole to 40 mg twice daily.  Discussed that she should follow-up with me in 6 to 8 weeks and if there is no better we could consider EGD.    12/23/2020 barium esophagram showed no definite abnormality but the barium tablet was delayed in the midesophagus for approximately a minute.    12/25/2020 EGD with benign-appearing esophageal stenosis dilated to 16.5 mm and small hiatal hernia as well as a single gastric polyp and gastritis.  Pathology showed mild chronic gastritis.  At that time was recommended that she had persistent dysphagia then would recommend esophageal manometry.    Today, the patient presents to clinic accompanied by her daughter who assists with her history.  She tells me that she has lost about 50 pounds since June/July of this year and since time of EGD has not been having any more vomiting but still cannot swallow.  Apparently is able to get down some soups and liquidy type things, but if she drinks water or any thin fluids they seem to come right back out.  The  temperature seems to matter slightly, if it is warmer it seems to help a little bit.  Her daughter is concerned because "she only has 1 kidney and now she is getting dehydrated and her kidney values are going up".  Patient does express that her abdominal pain is some better after being on the Pantoprazole 40 twice a day.    Denies fever, chills or blood in her stool.      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/10/13   Cancer ([place])    melanoma in 2 sights   Complication of anesthesia    "chills every evening at sundown x 2 weeks"- no fever   Depression    Dysphagia    History of kidney cancer    History of shingles    NO RESIDUAL PROBLEMS   Hypertension    Pain    LOWER BACK PAIN   Sleep apnea    CPAP    Past Surgical History:  Procedure Laterality Date   APPENDECTOMY  1958   COLONOSCOPY     COLONOSCOPY W/ POLYPECTOMY     7 polyps   FOOT SURGERY Right    removal plantar wart   GALLBLADDER SURGERY  02/14/1990   INJECTION KNEE  09/2011   right    KNEE ARTHROSCOPY  1996   LEFT HEART CATH AND CORONARY ANGIOGRAPHY N/A 07/11/2019   Procedure: LEFT HEART CATH AND CORONARY  ANGIOGRAPHY;  Surgeon: Belva Crome, MD;  Location: [facility];  Service: Cardiovascular;  Laterality: N/A;   MELANOMA EXCISION  1992   right chin, back   NEPHRECTOMY Left 2022   POLYPECTOMY     SALPINGOOPHORECTOMY  2009   Right   TOTAL KNEE ARTHROPLASTY Right 06/10/2013   Procedure: RIGHT TOTAL KNEE ARTHROPLASTY;  Surgeon: [name], MD;  Location: [facility];  Service: Orthopedics;  Laterality: Right;   TOTAL KNEE ARTHROPLASTY Left 12/16/2013   Procedure: LEFT TOTAL KNEE ARTHROPLASTY;  Surgeon: [name], MD;  Location: [facility];  Service: Orthopedics;  Laterality: Left;    Current Outpatient Medications  Medication Sig Dispense Refill   acetaminophen (TYLENOL) 650 MG CR tablet Take 1,300 mg by mouth every 8 (eight)  hours as needed for pain.      aspirin EC 81 MG tablet Take 1 tablet (81 mg total) by mouth daily. 90 tablet 3   cetirizine (ZYRTEC) 10 MG tablet Take 10 mg by mouth daily as needed for allergies.     citalopram (CELEXA) 20 MG tablet TAKE 1 TABLET AT BEDTIME 90 tablet 1   metoprolol succinate (TOPROL-XL) 25 MG 24 hr tablet Take 2 tablets (50 mg total) by mouth in the morning AND 1 tablet (25 mg total) every evening. 270 tablet 3   nitroGLYCERIN (NITROSTAT) 0.4 MG SL tablet Place 1 tablet (0.4 mg total) under the tongue every 5 (five) minutes as needed for chest pain. 25 tablet 3   pantoprazole (PROTONIX) 40 MG tablet Take 1 tablet (40 mg total) by mouth 2 (two) times daily before a meal. 60 tablet 3   ranolazine (RANEXA) 500 MG 12 hr tablet Take 1 tablet by mouth 2 (two) times daily.     No current facility-administered medications for this visit.    Allergies as of 01/14/2021 - Review Complete 01/14/2021  Allergen Reaction Noted   Atorvastatin  09/03/2020   Rosuvastatin  09/03/2020    Family History  Problem Relation Age of Onset   Heart disease Father    Heart attack Father 68       53   Hypertension Father    Osteoporosis Mother    Breast cancer Mother    Cancer Maternal Grandmother        Breast and Uterine Cancer   Breast cancer Maternal Grandmother    Thyroid cancer Maternal Grandmother    Hypertension Sister    Diabetes Sister    Colon polyps Sister    Colon cancer Neg Hx    Rectal cancer Neg Hx    Stomach cancer Neg Hx    Esophageal cancer Neg Hx    Allergic rhinitis Neg Hx    Asthma Neg Hx    Eczema Neg Hx    Urticaria Neg Hx     Social History   Socioeconomic History   Marital status: Widowed    Spouse name: Not on file   Number of children: 1   Years of education: Not on file   Highest education level: Not on file  Occupational History   Occupation: CMA    Employer: [employer]  Tobacco Use   Smoking status: Never   Smokeless tobacco: Never  Vaping  Use   Vaping Use: Never used  Substance and Sexual Activity   Alcohol use: No    Alcohol/week: 0.0 standard drinks   Drug use: No   Sexual activity: Not Currently    Comment: 1st intercourse- 18, partners- 1,  Other Topics Concern   Not on file  Social History Narrative   Lives alone.  Widow.  One son.    Caffienated drinks-no   Seat belt use often-yes   Regular Exercise-yes   Smoke alarm in the home-yes   Firearms/guns in the home-no   History of physical abuse-no            Social Determinants of Health   Financial Resource Strain: Not on file  Food Insecurity: Not on file  Transportation Needs: Not on file  Physical Activity: Not on file  Stress: Not on file  Social Connections: Not on file  Intimate Partner Violence: Not on file    Review of Systems:    Constitutional: No weight loss, fever or chills Cardiovascular: No chest pain Respiratory: No SOB Gastrointestinal: See HPI and otherwise negative   Physical Exam:  Vital signs: BP 114/60   Pulse 76   Ht 5\' 6"  (1.676 m)   Wt 167 lb 12.8 oz (76.1 kg)   BMI 27.08 kg/m    Constitutional:   Pleasant Caucasian female appears to be in NAD, Well developed, Well nourished, alert and cooperative Respiratory: Respirations even and unlabored. Lungs clear to auscultation bilaterally.   No wheezes, crackles, or rhonchi.  Cardiovascular: Normal S1, S2. No MRG. Regular rate and rhythm. No peripheral edema, cyanosis or pallor.  Gastrointestinal:  Soft, nondistended, nontender. No rebound or guarding. Normal bowel sounds. No appreciable masses or hepatomegaly. Rectal:  Not performed.  Psychiatric: Demonstrates good judgement and reason without abnormal affect or behaviors.  No recent labs or imaging.  Assessment: 1.  Nausea and vomiting: Better now after Pantoprazole 40 twice daily, EGD did show gastritis likely this is related 2.  Dysphagia: Barium swallow with delayed tablet, EGD with stricture status post dilation, no  real improvement per patient; consider spasm 3.  Weight loss: 50 pounds since June since trouble with nausea vomiting and dysphagia  Plan: 1.  Discussed with patient and her daughter that [facility] is backed up until February for esophageal manometries.  Went ahead and referred patient to [facility] for a sooner appointment for esophageal manometry to figure out if she has esophageal spasm, given that she has 1 kidney and dehydration is worse for her. 2.  In general patient's abdominal pain is some better and she no longer has nausea and vomiting thanks to the Pantoprazole 40 twice a day.  Would recommend she continue this. 3.  Recommend Boost/Ensure shakes 3-4 times a day to supplement. 4.  Patient to follow-up with [place] as needed after esophageal manometry  Ellouise Newer, PA-C [facility] 01/14/2021, 1:34 PM  Cc: Janith Lima, MD

## 2021-01-14 NOTE — Patient Instructions (Signed)
If you are age 70 or older, your body mass index should be between 23-30. Your Body mass index is 27.08 kg/m. If this is out of the aforementioned range listed, please consider follow up with your Primary Care Provider. ________________________________________________________  The [facility] providers would like to encourage you to use [facility] to communicate with providers for non-urgent requests or questions.  Due to long hold times on the telephone, sending your provider a message by [facility] may be a faster and more efficient way to get a response.  Please allow 48 business hours for a response.  Please remember that this is for non-urgent requests.  _______________________________________________________  Refills of Pantoprazole have been sent to your pharmacy.  Try to drink Insure and/or Boost.  We will be referring you to [facility] to have your Esophageal Manometry. Please allow 2 weeks for them to contact you.  Follow up pending at this point.  Thank you for entrusting me with your care and choosing [facility].  Ellouise Newer, PA-C

## 2021-01-14 NOTE — Progress Notes (Signed)
Addendum: Reviewed and agree with assessment and management plan. Xavion Muscat M, MD  

## 2021-01-15 ENCOUNTER — Telehealth: Payer: Self-pay | Admitting: Internal Medicine

## 2021-01-15 NOTE — Telephone Encounter (Signed)
Called pt, phone continuously rings.  Will reach out via MyChart also.

## 2021-01-15 NOTE — Telephone Encounter (Signed)
Patient states she has sinus pressure and pain, patient requesting a rx for a zpack  Advised patient to schedule an ov, patient declined, patient requesting a call back to discuss symptoms and request

## 2021-01-19 ENCOUNTER — Ambulatory Visit (INDEPENDENT_AMBULATORY_CARE_PROVIDER_SITE_OTHER): Payer: Medicare Other | Admitting: Internal Medicine

## 2021-01-19 ENCOUNTER — Encounter: Payer: Self-pay | Admitting: Internal Medicine

## 2021-01-19 ENCOUNTER — Other Ambulatory Visit: Payer: Self-pay

## 2021-01-19 DIAGNOSIS — J069 Acute upper respiratory infection, unspecified: Secondary | ICD-10-CM

## 2021-01-19 DIAGNOSIS — I2510 Atherosclerotic heart disease of native coronary artery without angina pectoris: Secondary | ICD-10-CM | POA: Diagnosis not present

## 2021-01-19 DIAGNOSIS — N184 Chronic kidney disease, stage 4 (severe): Secondary | ICD-10-CM | POA: Diagnosis not present

## 2021-01-19 NOTE — Patient Instructions (Signed)
I would recommend to keep taking zyrtec.

## 2021-01-19 NOTE — Progress Notes (Signed)
   Subjective:   Patient ID: Kelsey Church, female    DOB: 03-08-1950, 70 y.o.   MRN: [id number]  HPI The patient is a 70 YO female coming in for 4 days sinus symptoms after flying home.   Review of Systems  Constitutional:  Positive for activity change. Negative for appetite change, chills, fatigue, fever and unexpected weight change.  HENT:  Positive for congestion, postnasal drip, rhinorrhea and sinus pressure. Negative for ear discharge, ear pain, sinus pain, sneezing, sore throat, tinnitus, trouble swallowing and voice change.   Eyes: Negative.   Respiratory:  Positive for cough. Negative for chest tightness, shortness of breath and wheezing.   Cardiovascular: Negative.   Gastrointestinal: Negative.   Musculoskeletal:  Negative for myalgias.  Neurological: Negative.    Objective:  Physical Exam Constitutional:      Appearance: She is well-developed.  HENT:     Head: Normocephalic and atraumatic.     Comments: Oropharynx with redness and clear drainage, nose with swollen turbinates, TMs normal bilaterally.  Neck:     Thyroid: No thyromegaly.  Cardiovascular:     Rate and Rhythm: Normal rate and regular rhythm.  Pulmonary:     Effort: Pulmonary effort is normal. No respiratory distress.     Breath sounds: Normal breath sounds. No wheezing or rales.  Abdominal:     Palpations: Abdomen is soft.  Musculoskeletal:        General: No tenderness.     Cervical back: Normal range of motion.  Lymphadenopathy:     Cervical: No cervical adenopathy.  Skin:    General: Skin is warm and dry.  Neurological:     Mental Status: She is alert and oriented to person, place, and time.    Vitals:   01/19/21 1427  BP: 116/74  Pulse: 73  Resp: 18  Temp: 98 F (36.7 C)  TempSrc: Oral  SpO2: 99%  Weight: 167 lb 12.8 oz (76.1 kg)  Height: 5\' 6"  (1.676 m)    This visit occurred during the SARS-CoV-2 public health emergency.  Safety protocols were in place, including screening  questions prior to the visit, additional usage of staff PPE, and extensive cleaning of exam room while observing appropriate contact time as indicated for disinfecting solutions.   Assessment & Plan:  Visit time 20 minutes in face to face communication with patient and coordination of care, additional 5 minutes spent in record review, coordination or care, ordering tests, communicating/referring to other healthcare professionals, documenting in medical records all on the same day of the visit for total time 25 minutes spent on the visit.

## 2021-01-20 DIAGNOSIS — J069 Acute upper respiratory infection, unspecified: Secondary | ICD-10-CM | POA: Insufficient documentation

## 2021-01-20 NOTE — Assessment & Plan Note (Signed)
She became ill with viral URI symptoms after flying. Likely flu or other viral illness. No flu testing kits available currently and outside window for tamiflu. She is encouraged to drink plenty of fluids, taking her zyrtec daily. Timeline 1-2 weeks for recovery. She was hoping for a medication to help her improve prior to trip in 2 days however since this is viral antibiotics are not indicated and she will likely recover over the next week or so. Offered cough medicine she is going to use otc.

## 2021-01-27 DIAGNOSIS — Z905 Acquired absence of kidney: Secondary | ICD-10-CM | POA: Diagnosis not present

## 2021-01-27 DIAGNOSIS — R42 Dizziness and giddiness: Secondary | ICD-10-CM | POA: Diagnosis not present

## 2021-01-27 DIAGNOSIS — C642 Malignant neoplasm of left kidney, except renal pelvis: Secondary | ICD-10-CM | POA: Diagnosis not present

## 2021-01-27 DIAGNOSIS — I2510 Atherosclerotic heart disease of native coronary artery without angina pectoris: Secondary | ICD-10-CM | POA: Diagnosis not present

## 2021-01-27 DIAGNOSIS — R1310 Dysphagia, unspecified: Secondary | ICD-10-CM | POA: Diagnosis not present

## 2021-01-27 DIAGNOSIS — N184 Chronic kidney disease, stage 4 (severe): Secondary | ICD-10-CM | POA: Diagnosis not present

## 2021-01-27 DIAGNOSIS — D631 Anemia in chronic kidney disease: Secondary | ICD-10-CM | POA: Diagnosis not present

## 2021-01-27 NOTE — Progress Notes (Signed)
I, Kelsey Church, LAT, ATC, am serving as scribe for Dr. Lynne Church.  Subjective:    CC: L hip pain  HPI: Pt is a 70 y/o female c/o L hip pain since late Nov after suffering a fall, landing on the lateral aspect of her L hip. Pt locates pain to her L lateral hip.  Swelling: yes L hip mechanical symptoms: no Aggravates: laying on her L side; getting into the car; worse at night Treatments tried: heat; arnica gel  Pertinent review of Systems: No fevers or chills  Relevant historical information: Chronic kidney disease.   Objective:    Vitals:   01/28/21 1310  BP: 110/62  Pulse: 63  SpO2: 99%   General: Well Developed, well nourished, and in no acute distress.   MSK: Left hip normal-appearing Tender palpation lateral hip normal hip motion.  Hip abduction strength is diminished. Mild antalgic gait.  Lab and Radiology Results  Procedure: Real-time Ultrasound Guided Injection of left hip greater trochanter bursa Device: Philips Affiniti 50G Images permanently stored and available for review in PACS Ultrasound evaluation prior to injection reveals a moderate greater trochanter bursa Verbal informed consent obtained.  Discussed risks and benefits of procedure. Warned about infection bleeding damage to structures skin hypopigmentation and fat atrophy among others. Patient expresses understanding and agreement Time-out conducted.   Noted no overlying erythema, induration, or other signs of local infection.   Skin prepped in a sterile fashion.   Local anesthesia: Topical Ethyl chloride.   With sterile technique and under real time ultrasound guidance: 40 mg of Kenalog and 2 mL of Marcaine injected into left greater trochanter bursa. Fluid seen entering the bursa.   Completed without difficulty   Pain immediately resolved suggesting accurate placement of the medication.   Advised to call if fevers/chills, erythema, induration, drainage, or persistent bleeding.   Images  permanently stored and available for review in the ultrasound unit.  Impression: Technically successful ultrasound guided injection.    X-ray images left hip obtained today personally and independently interpreted No acute fractures.  Enthesopathic changes greater trochanter Await formal radiology review    Impression and Recommendations:    Assessment and Plan: 70 y.o. female with left lateral hip pain and about 3 or 4 weeks after a fall.  Pain due to greater trochanteric bursitis.  Plan for injection and referral to physical therapy.  Recheck in 6 weeks.  PDMP not reviewed this encounter. Orders Placed This Encounter  Procedures   DG HIP UNILAT W OR W/O PELVIS 2-3 VIEWS LEFT    Standing Status:   Future    Number of Occurrences:   1    Standing Expiration Date:   01/27/2022    Order Specific Question:   Reason for Exam (SYMPTOM  OR DIAGNOSIS REQUIRED)    Answer:   left hip pain    Order Specific Question:   Preferred imaging location?    Answer:   [facility]   [place] LIMITED JOINT SPACE STRUCTURES LOW LEFT(NO LINKED CHARGES)    Order Specific Question:   Reason for Exam (SYMPTOM  OR DIAGNOSIS REQUIRED)    Answer:   L hip pain    Order Specific Question:   Preferred imaging location?    Answer:   [facility]   Ambulatory referral to Physical Therapy    Referral Priority:   Routine    Referral Type:   Physical Medicine    Referral Reason:   Specialty Services Required  Requested Specialty:   Physical Therapy    Number of Visits Requested:   1   No orders of the defined types were placed in this encounter.   Discussed warning signs or symptoms. Please see discharge instructions. Patient expresses understanding.   The above documentation has been reviewed and is accurate and complete Kelsey Church, M.D.

## 2021-01-28 ENCOUNTER — Ambulatory Visit: Payer: Medicare Other

## 2021-01-28 ENCOUNTER — Encounter: Payer: Self-pay | Admitting: Family Medicine

## 2021-01-28 ENCOUNTER — Other Ambulatory Visit: Payer: Self-pay

## 2021-01-28 ENCOUNTER — Ambulatory Visit (INDEPENDENT_AMBULATORY_CARE_PROVIDER_SITE_OTHER): Payer: Medicare Other

## 2021-01-28 ENCOUNTER — Ambulatory Visit (INDEPENDENT_AMBULATORY_CARE_PROVIDER_SITE_OTHER): Payer: Medicare Other | Admitting: Family Medicine

## 2021-01-28 VITALS — BP 110/62 | HR 63 | Ht 66.0 in | Wt 166.2 lb

## 2021-01-28 DIAGNOSIS — I2510 Atherosclerotic heart disease of native coronary artery without angina pectoris: Secondary | ICD-10-CM | POA: Diagnosis not present

## 2021-01-28 DIAGNOSIS — G8929 Other chronic pain: Secondary | ICD-10-CM

## 2021-01-28 DIAGNOSIS — M25552 Pain in left hip: Secondary | ICD-10-CM

## 2021-01-28 IMAGING — DX DG HIP (WITH OR WITHOUT PELVIS) 2-3V*L*
3 series · 3 of 3 positions shown · non-contrast
Comparison: None.

CLINICAL DATA: Left hip pain fall 1 month ago

EXAM:
DG HIP (WITH OR WITHOUT PELVIS) 2-3V LEFT

[pelvis ap]
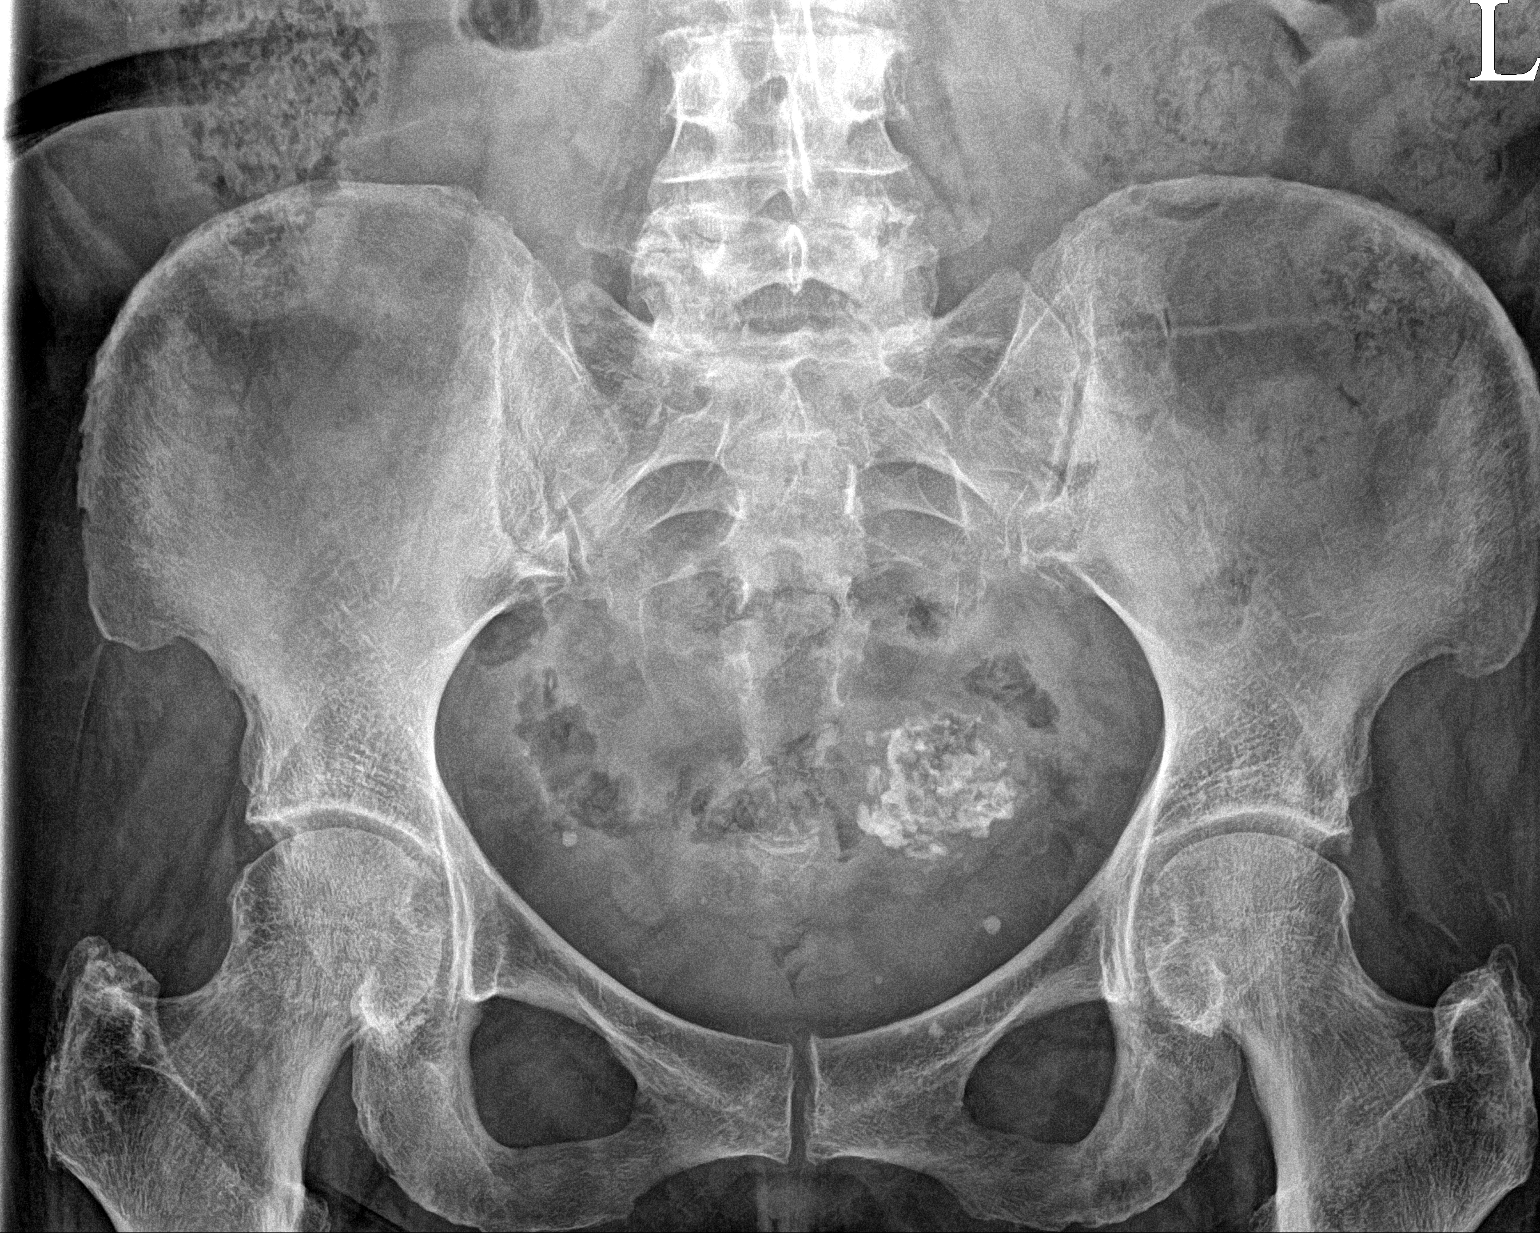

[hip ap]
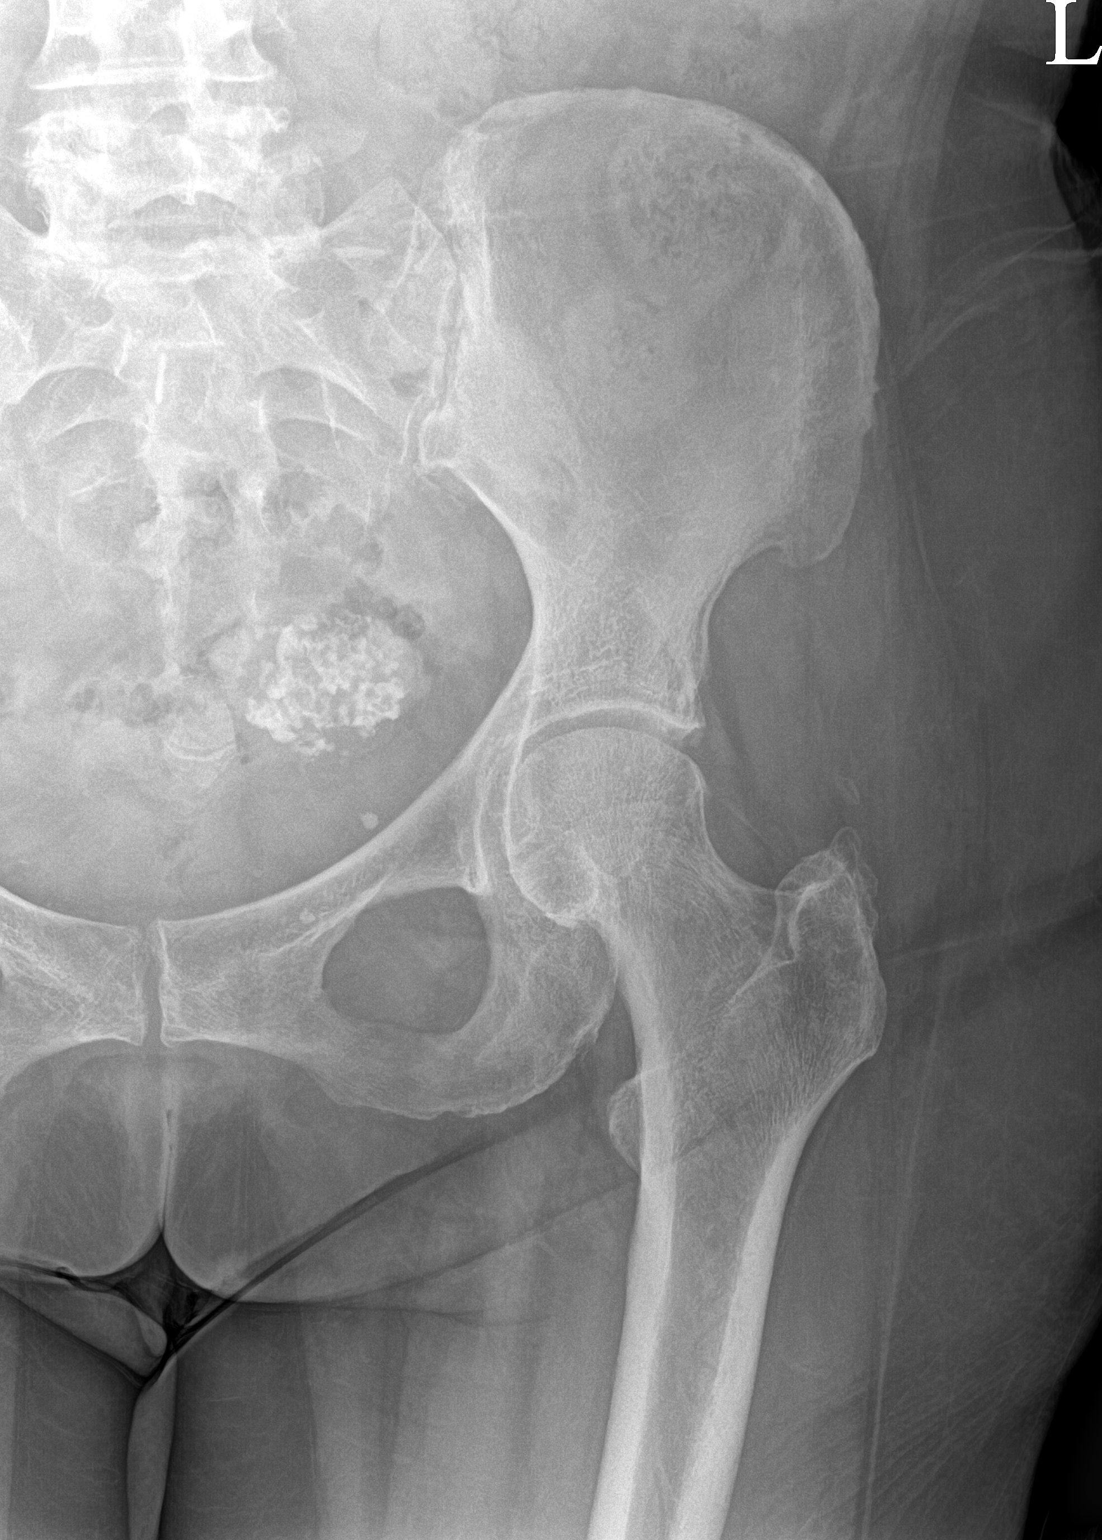

[hip frog leg]
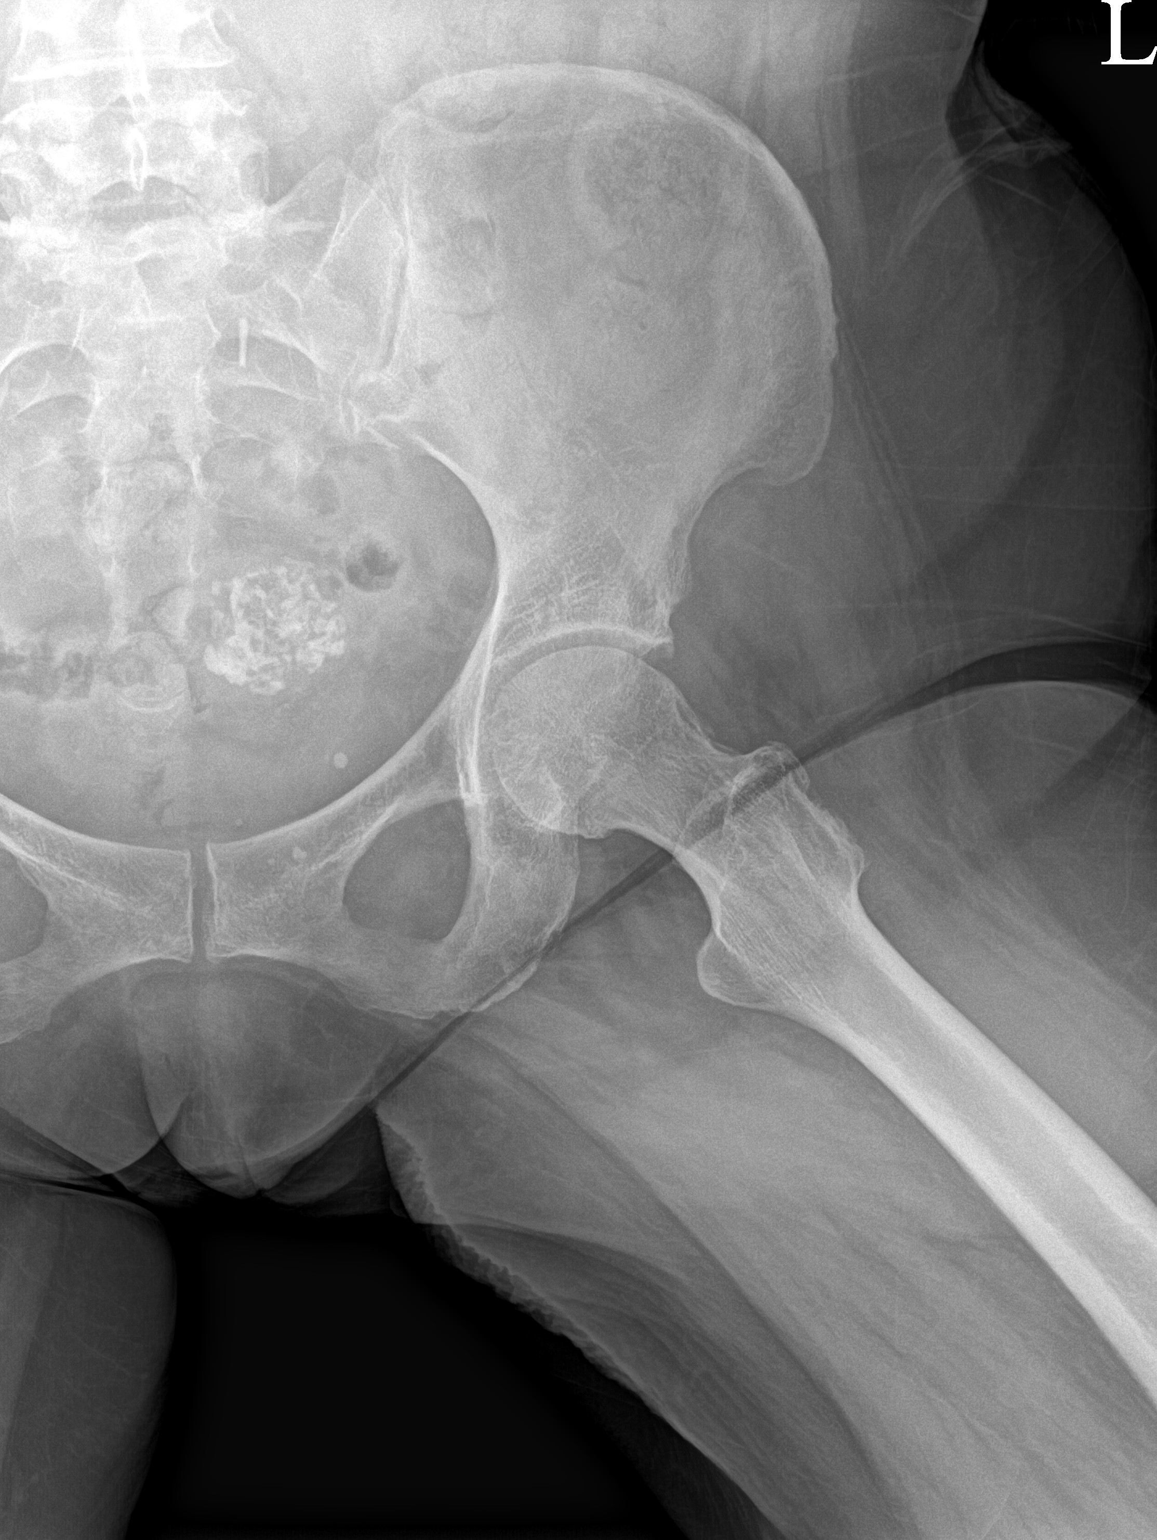

[3 of 3 positions shown; findings below may reference images not displayed]

FINDINGS: SI joints are non widened. Pubic symphysis and rami appear intact.
No fracture or malalignment. Joint space is patent. Probable
calcified uterine fibroid in the left pelvis
IMPRESSION: No acute osseous abnormality

## 2021-01-28 NOTE — Patient Instructions (Addendum)
Nice to meet you.  Please get an Xray today before you leave.  You received an injection today. Seek immediate medical attention if the joint becomes red, extremely painful, or is oozing fluid.   I've referred you to Physical Therapy.  Let us know if you don't hear from them in one week.   Follow-up: in 6 weeks

## 2021-02-01 ENCOUNTER — Encounter: Payer: Medicare Other | Admitting: Internal Medicine

## 2021-02-03 NOTE — Progress Notes (Signed)
Left hip x-ray looks normal to radiology.

## 2021-02-04 ENCOUNTER — Encounter: Payer: Medicare Other | Admitting: Internal Medicine

## 2021-02-16 ENCOUNTER — Other Ambulatory Visit: Payer: Self-pay

## 2021-02-16 ENCOUNTER — Encounter: Payer: Self-pay | Admitting: Physical Therapy

## 2021-02-16 ENCOUNTER — Ambulatory Visit: Payer: Medicare Other | Attending: Family Medicine | Admitting: Physical Therapy

## 2021-02-16 ENCOUNTER — Ambulatory Visit: Payer: Medicare Other | Attending: Internal Medicine

## 2021-02-16 DIAGNOSIS — M25652 Stiffness of left hip, not elsewhere classified: Secondary | ICD-10-CM | POA: Insufficient documentation

## 2021-02-16 DIAGNOSIS — M25552 Pain in left hip: Secondary | ICD-10-CM | POA: Diagnosis not present

## 2021-02-16 DIAGNOSIS — M545 Low back pain, unspecified: Secondary | ICD-10-CM | POA: Diagnosis not present

## 2021-02-16 DIAGNOSIS — Z23 Encounter for immunization: Secondary | ICD-10-CM

## 2021-02-16 DIAGNOSIS — G8929 Other chronic pain: Secondary | ICD-10-CM | POA: Diagnosis not present

## 2021-02-16 DIAGNOSIS — M6281 Muscle weakness (generalized): Secondary | ICD-10-CM | POA: Insufficient documentation

## 2021-02-16 NOTE — Therapy (Signed)
[facility] [address]  [place], [place], [address] Phone: [phone]   Fax:  [phone]  Physical Therapy Evaluation  Patient Details  Name: Kelsey Church MRN: [id number] Date of Birth: October 28, 1950 Referring Provider (PT): Gregor Hams   Encounter Date: 02/16/2021   PT End of Session - 02/16/21 1456     Visit Number 1    Number of Visits 12    Date for PT Re-Evaluation 03/30/21    Authorization Type Medicare + BCBS    PT Start Time 1405    PT Stop Time 1450    PT Time Calculation (min) 45 min    Activity Tolerance Patient tolerated treatment well    Behavior During Therapy WFL for tasks assessed/performed             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/10/13   Cancer ([place])    melanoma in 2 sights   Complication of anesthesia    "chills every evening at sundown x 2 weeks"- no fever   Depression    Dysphagia    History of kidney cancer    History of shingles    NO RESIDUAL PROBLEMS   Hypertension    Pain    LOWER BACK PAIN   Sleep apnea    CPAP    Past Surgical History:  Procedure Laterality Date   APPENDECTOMY  1958   COLONOSCOPY     COLONOSCOPY W/ POLYPECTOMY     7 polyps   FOOT SURGERY Right    removal plantar wart   GALLBLADDER SURGERY  02/14/1990   INJECTION KNEE  09/2011   right    KNEE ARTHROSCOPY  1996   LEFT HEART CATH AND CORONARY ANGIOGRAPHY N/A 07/11/2019   Procedure: LEFT HEART CATH AND CORONARY ANGIOGRAPHY;  Surgeon: Belva Crome, MD;  Location: [facility];  Service: Cardiovascular;  Laterality: N/A;   MELANOMA EXCISION  1992   right chin, back   NEPHRECTOMY Left 2022   POLYPECTOMY     SALPINGOOPHORECTOMY  2009   Right   TOTAL KNEE ARTHROPLASTY Right 06/10/2013   Procedure: RIGHT TOTAL KNEE ARTHROPLASTY;  Surgeon: [name], MD;  Location: [facility];  Service: Orthopedics;   Laterality: Right;   TOTAL KNEE ARTHROPLASTY Left 12/16/2013   Procedure: LEFT TOTAL KNEE ARTHROPLASTY;  Surgeon: [name], MD;  Location: [facility];  Service: Orthopedics;  Laterality: Left;    There were no vitals filed for this visit.    Subjective Assessment - 02/16/21 1407     Subjective Patient reports having difficulty swallowing and losing weight in September, also lost a lot of muscle tone.  She also moved and the stress from that were also contributing factors, started throwing up, which is stressful for body, only has one kidney.  Still having a lot of trouble with drinking and swallowing.  She also reports she thinks some medications are causing dizziness.  She fell the week before Thanksgiving while working with grandson, got up from chair too quickly, turned and fell.  She reports she developed large hematoma on L hip.   Had cortisone injection on 01/28/2021 which helped with bursitis, can lay on that side now.    Pertinent History gastritis and GERD, recent weight loss, nephrectomy 2022, history cancer, history bil TKR, history cardiac disease.    How long can you  sit comfortably? not limited    How long can you stand comfortably? 30 min - back starts to hurt.    How long can you walk comfortably? not limited.    Diagnostic tests diagnostic US, X-rays were unremarkable    Patient Stated Goals decrease L hip/back pain and improve back/hip strength    Currently in Pain? Yes    Pain Score 4    mornings worst, yesterday morning 10/10   Pain Location Hip    Pain Orientation Left    Pain Descriptors / Indicators Nagging    Pain Type Chronic pain    Pain Radiating Towards across low back to L buttock    Pain Onset More than a month ago    Pain Frequency Constant    Aggravating Factors  mornings, standing prolonged periods.    Pain Relieving Factors biofreeze, tylenol                [facility] PT Assessment - 02/16/21 0001       Assessment   Medical Diagnosis  M25.552,G89.29 (ICD-10-CM) - Chronic left hip pain    Referring Provider (PT) Gregor Hams.    Onset Date/Surgical Date 01/02/21    Prior Therapy yes for balance      Precautions   Precautions None      Restrictions   Weight Bearing Restrictions No      Balance Screen   Has the patient fallen in the past 6 months Yes    How many times? 1    Has the patient had a decrease in activity level because of a fear of falling?  Yes    Is the patient reluctant to leave their home because of a fear of falling?  No      Home Environment   Living Environment Private residence    Living Arrangements Alone    Available Help at Discharge Family;Friend(s)    Type of [place] Access Level entry    [place] One level      Prior Function   Level of [place] Retired      Observation/Other Assessments   Observations Enters independently without device, noted significant weight loss, fatigued    Focus on Therapeutic Outcomes (FOTO)  63%, predicted outcome 71% after 11 visits      ROM / Strength   AROM / PROM / Strength Strength;PROM      PROM   Overall PROM  Deficits    Overall PROM Comments noted significant tightness for L hip ER/IR compared to R hip.      Strength   Overall Strength Deficits    Overall Strength Comments L hip pain with resistance, tested in sitting    Strength Assessment Site Hip;Knee;Ankle    Right/Left Hip Right;Left    Right Hip Flexion 4+/5    Right Hip ABduction 5/5    Right Hip ADduction 4/5    Left Hip Flexion 4/5   increased pain   Left Hip ABduction 4/5    Left Hip ADduction 4/5    Right/Left Knee Right;Left    Right Knee Flexion 5/5    Right Knee Extension 5/5    Left Knee Flexion 5/5    Left Knee Extension 5/5    Right/Left Ankle Right;Left    Right Ankle Dorsiflexion 5/5    Left Ankle Dorsiflexion 4/5      Flexibility   Soft Tissue Assessment /Muscle Length yes    Hamstrings  tightness bil    Piriformis  tightness bil      Palpation   Spinal mobility hypomobility throughout lumbar spine, noted tenderness L5    SI assessment  tenderness bil SIJ    Palpation comment tenderness/tightness bil piriformis/glut med, R side more painful than L side.  Tenderness L greater trochanter.      Special Tests   Other special tests negative SLR for back pain, increased hip flexor pain with FABER bil, neg Scour test bil                        Objective measurements completed on examination: See above findings.       [facility] Adult PT Treatment/Exercise - 02/16/21 0001       Self-Care   Self-Care Other Self-Care Comments    Other Self-Care Comments  see patient education.  continue to keep [place] elevated due to GERD      Exercises   Exercises Lumbar      Lumbar Exercises: Seated   Sit to Stand 5 reps      Lumbar Exercises: Supine   Pelvic Tilt 10 reps    Pelvic Tilt Limitations cues for TrA contraction.    Bridge 10 reps    Bridge Limitations cues for TrA contraction    Other Supine Lumbar Exercises LTR x 10, supine clams with YTB x 10                     PT Education - 02/16/21 1503     Education Details educated on findings, POC, initial HEP    Person(s) Educated Patient    Methods Explanation;Demonstration;Verbal cues;Handout    Comprehension Verbalized understanding;Returned demonstration              PT Short Term Goals - 02/16/21 1504       PT SHORT TERM GOAL #1   Title Patient to demonstrate independence with initial HEP.    Baseline Initial HEP given    Time 2    Period Weeks    Status New    Target Date 03/02/21               PT Long Term Goals - 02/16/21 1505       PT LONG TERM GOAL #1   Title Patient to be independent with advanced HEP.    Time 6    Period Weeks    Status New    Target Date 03/30/21      PT LONG TERM GOAL #2   Title Patient to demonstrate B LE strength >/=4+/5.    Time 6    Period Weeks    Status New     Target Date 03/30/21      PT LONG TERM GOAL #3   Title Patient will report no back/L hip pain at rest.    Baseline 5/10 at rest increases to 10/10 in morning.    Time 6    Period Weeks    Status New    Target Date 03/30/21      PT LONG TERM GOAL #4   Title Patient will be able to ascend/descend stairs with reciprocal step, 1 HR, and good confidence safely.    Baseline feels like her hip is not going to support her on stairs.    Time 6    Period Weeks    Status New    Target Date 03/30/21      PT LONG TERM GOAL #  5   Title Pt. will demonstrate improved functional LE strength by completing 5x STS in <14 sec to decrease risk of falls.    Baseline 20 seconds, noted fatigue as progressed after 3 reps    Time 6    Period Weeks    Status New    Target Date 03/30/21                    Plan - 02/16/21 1457     Clinical Impression Statement Kelsey Church is a 71 y.o. female referred to PT for L hip pain following fall in November 2022.  She reports complicated medical history that worsened significantly after last seen for PT earlier this year, resulting in gastritis, rapid weight loss, and decreased muscle mass and increased fatigue.  While she has some tenderness over L trochanter from fall, she demonstrates low back pain, decreased L hip ROM, tenderness/tightness over bil SIJ and piriformis, decreased strength, endurance and tolerance to exercise.  Her 5x STS of 20 seconds places her at increased risk of falls and demonstrated decreased functional LE strength.  She would benefit from skilled physical therapy to decrease pain, improve LE strength and tolerance to exercise, and decrease risk of further falls with injury.    Personal Factors and Comorbidities Comorbidity 3+    Comorbidities history of cancer, heart disease, nephrectomy 2022, OA, history of bil knee replacements, GERD    Examination-Activity Limitations Bed Mobility;Locomotion Level;Sleep;Stairs;Stand     Examination-Participation Restrictions Cleaning;Meal Prep;Yard Work;Community Activity    Stability/Clinical Decision Making Evolving/Moderate complexity    Clinical Decision Making Moderate    Rehab Potential Good    PT Frequency 2x / week    PT Duration 6 weeks    PT Treatment/Interventions ADLs/Self Care Home Management;Cryotherapy;Electrical Stimulation;Iontophoresis 4mg /ml Dexamethasone;Moist Heat;Ultrasound;Gait training;Stair training;Functional mobility training;Therapeutic activities;Therapeutic exercise;Balance training;Neuromuscular re-education;Manual techniques;Passive range of motion;Dry needling;Taping;Spinal Manipulations;Joint Manipulations    PT Next Visit Plan progress core/glut strengthening at tolerated, manual/modalities PRN    PT Home Exercise Plan Access Code: [email or web address]    Consulted and Agree with Plan of Care Patient             Patient will benefit from skilled therapeutic intervention in order to improve the following deficits and impairments:  Decreased activity tolerance, Decreased range of motion, Decreased strength, Pain, Decreased balance, Decreased coordination, Decreased mobility, Increased muscle spasms, Impaired flexibility  Visit Diagnosis: Pain in left hip - Plan: PT plan of care cert/re-cert  Stiffness of left hip, not elsewhere classified - Plan: PT plan of care cert/re-cert  Chronic bilateral low back pain without sciatica - Plan: PT plan of care cert/re-cert  Muscle weakness (generalized) - Plan: PT plan of care cert/re-cert     Problem List Patient Active Problem List   Diagnosis Date Noted   Viral URI 01/20/2021   Dysphagia    Hyperlipidemia LDL goal <70 12/07/2020   Atherosclerotic heart disease of native coronary artery with other forms of angina pectoris (HCC)    Symptomatic PVCs 12/26/2018   Stage 3a chronic kidney disease ([place]) 12/26/2018   Degenerative lumbar disc 11/01/2016   Gastroesophageal reflux disease with esophagitis  12/10/2014   Moderate osteopenia 12/10/2014   Vitamin B12 deficiency 12/10/2014   OA (osteoarthritis) of knee 12/16/2013   Prediabetes 12/03/2013   Other screening mammogram 09/25/2012   DJD (degenerative joint disease) of knee 09/09/2011   OSA (obstructive sleep apnea) 07/29/2011   Essential hypertension, benign 07/29/2011    Rennie Natter, PT, DPT  02/16/2021, 3:19 PM  [facility] [address]  [place], [place], [address] Phone: [phone]   Fax:  [phone]  Name: Kelsey Church MRN: [id number] Date of Birth: 10/08/1950

## 2021-02-16 NOTE — Patient Instructions (Signed)
Access Code: [email or web address] URL: [email or web address] Date: 02/16/2021 Prepared by: Glenetta Hew  Exercises Supine Lower Trunk Rotation - 1 x daily - 7 x weekly - 2 sets - 10 reps Supine Posterior Pelvic Tilt - 1 x daily - 7 x weekly - 2 sets - 10 reps Supine Bridge - 1 x daily - 7 x weekly - 2 sets - 10 reps Hooklying Clamshell with Resistance - 1 x daily - 7 x weekly - 2 sets - 10 reps Sit to Stand - 1 x daily - 7 x weekly - 2 sets - 10 reps

## 2021-02-18 ENCOUNTER — Other Ambulatory Visit (HOSPITAL_BASED_OUTPATIENT_CLINIC_OR_DEPARTMENT_OTHER): Payer: Self-pay

## 2021-02-18 DIAGNOSIS — Z23 Encounter for immunization: Secondary | ICD-10-CM | POA: Diagnosis not present

## 2021-02-18 MED ORDER — PFIZER COVID-19 VAC BIVALENT 30 MCG/0.3ML IM SUSP
INTRAMUSCULAR | 0 refills | Status: DC
  Filled 2021-02-18: qty 0.3, 1d supply, fill #0

## 2021-02-22 ENCOUNTER — Encounter: Payer: Self-pay | Admitting: Cardiology

## 2021-02-22 ENCOUNTER — Encounter: Payer: Self-pay | Admitting: Internal Medicine

## 2021-02-23 ENCOUNTER — Telehealth: Payer: Self-pay | Admitting: Internal Medicine

## 2021-02-23 ENCOUNTER — Telehealth: Payer: Self-pay | Admitting: Physician Assistant

## 2021-02-23 NOTE — Telephone Encounter (Signed)
Patient called and stated that she has not heard anything from [facility] for the Esophageal Manometry test. States that she is not any better and is still losing weight. Seeking advice, please advise.  [phone]

## 2021-02-23 NOTE — Telephone Encounter (Signed)
Patient's daughter-in-law  Yetta Flock Noll) wants to know if Dr. Etter Sjogren would consider taking up the patient as a TOC. She states that the pt has been very sick and due to Dr. Adah Salvage busy schedule she can never get in to see him. Please advice.    Alyssa Leaming [phone]

## 2021-02-23 NOTE — Telephone Encounter (Signed)
[facility] to follow up with patient's referral. They have advised that they have [phone] referrals and have not reached out to the patient. Santiago Glad has advised me that she would contact the patient today to get something scheduled.

## 2021-02-24 ENCOUNTER — Other Ambulatory Visit: Payer: Self-pay

## 2021-02-24 ENCOUNTER — Ambulatory Visit: Payer: Medicare Other

## 2021-02-24 DIAGNOSIS — M25652 Stiffness of left hip, not elsewhere classified: Secondary | ICD-10-CM

## 2021-02-24 DIAGNOSIS — M25552 Pain in left hip: Secondary | ICD-10-CM | POA: Diagnosis not present

## 2021-02-24 DIAGNOSIS — M6281 Muscle weakness (generalized): Secondary | ICD-10-CM | POA: Diagnosis not present

## 2021-02-24 DIAGNOSIS — M545 Low back pain, unspecified: Secondary | ICD-10-CM | POA: Diagnosis not present

## 2021-02-24 DIAGNOSIS — G8929 Other chronic pain: Secondary | ICD-10-CM | POA: Diagnosis not present

## 2021-02-24 NOTE — Patient Instructions (Signed)
Access Code: [email or web address] URL: [email or web address] Date: 02/24/2021 Prepared by: Clarene Essex  Exercises Supine Lower Trunk Rotation - 1 x daily - 7 x weekly - 2 sets - 10 reps Supine Posterior Pelvic Tilt - 1 x daily - 7 x weekly - 2 sets - 10 reps Sit to Stand - 1 x daily - 7 x weekly - 2 sets - 10 reps Bridge with Hip Abduction and Resistance - 1 x daily - 7 x weekly - 3 sets - 10 reps Clamshell with Resistance - 1 x daily - 7 x weekly - 3 sets - 10 reps

## 2021-02-24 NOTE — Telephone Encounter (Signed)
LVM on patient DIL phone for her to call back and schedule appt with lowne

## 2021-02-24 NOTE — Therapy (Signed)
[facility] [address]  [place], [place], [address] Phone: [phone]   Fax:  [phone]  Physical Therapy Treatment  Patient Details  Name: Kelsey Church MRN: [id number] Date of Birth: [birth year] Referring Provider (PT): Gregor Hams   Encounter Date: 02/24/2021   PT End of Session - 02/24/21 1537     Visit Number 2    Number of Visits 12    Date for PT Re-Evaluation 03/30/21    Authorization Type Medicare + BCBS    PT Start Time 1456   pt arrived late   PT Stop Time 1528    PT Time Calculation (min) 32 min    Activity Tolerance Patient tolerated treatment well    Behavior During Therapy WFL for tasks assessed/performed             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/10/13   Cancer ([place])    melanoma in 2 sights   Complication of anesthesia    "chills every evening at sundown x 2 weeks"- no fever   Depression    Dysphagia    History of kidney cancer    History of shingles    NO RESIDUAL PROBLEMS   Hypertension    Pain    LOWER BACK PAIN   Sleep apnea    CPAP    Past Surgical History:  Procedure Laterality Date   APPENDECTOMY  1958   COLONOSCOPY     COLONOSCOPY W/ POLYPECTOMY     7 polyps   FOOT SURGERY Right    removal plantar wart   GALLBLADDER SURGERY  02/14/1990   INJECTION KNEE  09/2011   right    KNEE ARTHROSCOPY  1996   LEFT HEART CATH AND CORONARY ANGIOGRAPHY N/A 07/11/2019   Procedure: LEFT HEART CATH AND CORONARY ANGIOGRAPHY;  Surgeon: Belva Crome, MD;  Location: [facility];  Service: Cardiovascular;  Laterality: N/A;   MELANOMA EXCISION  1992   right chin, back   NEPHRECTOMY Left 2022   POLYPECTOMY     SALPINGOOPHORECTOMY  2009   Right   TOTAL KNEE ARTHROPLASTY Right 06/10/2013   Procedure: RIGHT TOTAL KNEE ARTHROPLASTY;  Surgeon: [name], MD;  Location: [facility];   Service: Orthopedics;  Laterality: Right;   TOTAL KNEE ARTHROPLASTY Left 12/16/2013   Procedure: LEFT TOTAL KNEE ARTHROPLASTY;  Surgeon: [name], MD;  Location: [facility];  Service: Orthopedics;  Laterality: Left;    There were no vitals filed for this visit.   Subjective Assessment - 02/24/21 1458     Subjective Pt reports a fall back during the week of thanksgiving causing her to return to PT and has pain at night.    Pertinent History gastritis and GERD, recent weight loss, nephrectomy 2022, history cancer, history bil TKR, history cardiac disease.    Diagnostic tests diagnostic US, X-rays were unremarkable    Patient Stated Goals decrease L hip/back pain and improve back/hip strength    Currently in Pain? Yes    Pain Score 4     Pain Location Hip    Pain Orientation Left    Pain Descriptors / Indicators Nagging    Pain Type Chronic pain  [facility] Adult PT Treatment/Exercise - 02/24/21 0001       Exercises   Exercises Knee/Hip      Lumbar Exercises: Stretches   Lower Trunk Rotation Limitations 10 reps each way    Pelvic Tilt 10 reps    Pelvic Tilt Limitations 2 sec hold    Figure 4 Stretch 2 reps;20 seconds;Supine;With overpressure      Lumbar Exercises: Aerobic   Nustep L4x60min      Lumbar Exercises: Supine   Clam 10 reps    Clam Limitations RTB    Bridge 10 reps    Bridge Limitations 10 more reps progressed with RTB and clamshell      Lumbar Exercises: Sidelying   Clam Left;10 reps    Clam Limitations RTB      Manual Therapy   Manual Therapy Soft tissue mobilization;Myofascial release    Soft tissue mobilization to L glute med, pirformis    Myofascial Release to L piriformis                       PT Short Term Goals - 02/24/21 1539       PT SHORT TERM GOAL #1   Title Patient to demonstrate independence with initial HEP.    Baseline Initial HEP given    Time 2    Period Weeks    Status  On-going    Target Date 03/02/21               PT Long Term Goals - 02/24/21 1539       PT LONG TERM GOAL #1   Title Patient to be independent with advanced HEP.    Time 6    Period Weeks    Status On-going    Target Date 03/30/21      PT LONG TERM GOAL #2   Title Patient to demonstrate B LE strength >/=4+/5.    Time 6    Period Weeks    Status On-going    Target Date 03/30/21      PT LONG TERM GOAL #3   Title Patient will report no back/L hip pain at rest.    Baseline 5/10 at rest increases to 10/10 in morning.    Time 6    Period Weeks    Status On-going    Target Date 03/30/21      PT LONG TERM GOAL #4   Title Patient will be able to ascend/descend stairs with reciprocal step, 1 HR, and good confidence safely.    Baseline feels like her hip is not going to support her on stairs.    Time 6    Period Weeks    Status On-going    Target Date 03/30/21      PT LONG TERM GOAL #5   Title Pt. will demonstrate improved functional LE strength by completing 5x STS in <14 sec to decrease risk of falls.    Baseline 20 seconds, noted fatigue as progressed after 3 reps    Time 6    Period Weeks    Status On-going    Target Date 03/30/21                   Plan - 02/24/21 1537     Clinical Impression Statement Pt required progression with HEP with bridges and clamshells. Cuing required with exercises and close supervision. TIme was limited today due to her late arrival but we reviewed the initial exercises. No complaints with any exercises.  Worked out tender points and stiffness in the glutes post session followed by stretches. Atrophy noted in L gluteal muscles.    Personal Factors and Comorbidities Comorbidity 3+    Comorbidities history of cancer, heart disease, nephrectomy 2022, OA, history of bil knee replacements, GERD    PT Frequency 2x / week    PT Duration 6 weeks    PT Treatment/Interventions ADLs/Self Care Home Management;Cryotherapy;Electrical  Stimulation;Iontophoresis 4mg /ml Dexamethasone;Moist Heat;Ultrasound;Gait training;Stair training;Functional mobility training;Therapeutic activities;Therapeutic exercise;Balance training;Neuromuscular re-education;Manual techniques;Passive range of motion;Dry needling;Taping;Spinal Manipulations;Joint Manipulations    PT Next Visit Plan progress core/glut strengthening at tolerated, manual/modalities PRN    PT Home Exercise Plan Access Code: [email or web address]    Consulted and Agree with Plan of Care Patient             Patient will benefit from skilled therapeutic intervention in order to improve the following deficits and impairments:  Decreased activity tolerance, Decreased range of motion, Decreased strength, Pain, Decreased balance, Decreased coordination, Decreased mobility, Increased muscle spasms, Impaired flexibility  Visit Diagnosis: Pain in left hip  Stiffness of left hip, not elsewhere classified  Chronic bilateral low back pain without sciatica  Muscle weakness (generalized)     Problem List Patient Active Problem List   Diagnosis Date Noted   Viral URI 01/20/2021   Dysphagia    Hyperlipidemia LDL goal <70 12/07/2020   Atherosclerotic heart disease of native coronary artery with other forms of angina pectoris ([place])    Symptomatic PVCs 12/26/2018   Stage 3a chronic kidney disease ([place]) 12/26/2018   Degenerative lumbar disc 11/01/2016   Gastroesophageal reflux disease with esophagitis 12/10/2014   Moderate osteopenia 12/10/2014   Vitamin B12 deficiency 12/10/2014   OA (osteoarthritis) of knee 12/16/2013   Prediabetes 12/03/2013   Other screening mammogram 09/25/2012   DJD (degenerative joint disease) of knee 09/09/2011   OSA (obstructive sleep apnea) 07/29/2011   Essential hypertension, benign 07/29/2011    Artist Pais, PTA 02/24/2021, 3:40 PM  [facility] [address]  [place], [place],  [address] Phone: [phone]   Fax:  [phone]  Name: KENYETTE GUNDY MRN: [id number] Date of Birth: [birth year]

## 2021-02-24 NOTE — Telephone Encounter (Signed)
Called and spoke with patient's daughter to confirm that she did receive a phone call. Her daughter has advised me that they did not receive a phone call. She has also advised me that they had reached out to [facility] a few times themselves and were basically blew off. The daughter has let me know that they would rather have this done at [facility] due to not being impressed with [facility] and would rather keep her care here in [place]. I advised I would call [facility] to find out when the next available is and call her.

## 2021-02-25 ENCOUNTER — Other Ambulatory Visit: Payer: Self-pay

## 2021-02-25 ENCOUNTER — Ambulatory Visit: Payer: Medicare Other | Admitting: Nurse Practitioner

## 2021-02-25 DIAGNOSIS — R634 Abnormal weight loss: Secondary | ICD-10-CM

## 2021-02-25 DIAGNOSIS — R112 Nausea with vomiting, unspecified: Secondary | ICD-10-CM

## 2021-02-25 DIAGNOSIS — R1310 Dysphagia, unspecified: Secondary | ICD-10-CM

## 2021-02-26 ENCOUNTER — Ambulatory Visit: Payer: Medicare Other | Admitting: Physical Therapy

## 2021-02-26 ENCOUNTER — Encounter: Payer: Self-pay | Admitting: Physical Therapy

## 2021-02-26 ENCOUNTER — Other Ambulatory Visit: Payer: Self-pay

## 2021-02-26 DIAGNOSIS — M25552 Pain in left hip: Secondary | ICD-10-CM | POA: Diagnosis not present

## 2021-02-26 DIAGNOSIS — M545 Low back pain, unspecified: Secondary | ICD-10-CM

## 2021-02-26 DIAGNOSIS — M25652 Stiffness of left hip, not elsewhere classified: Secondary | ICD-10-CM

## 2021-02-26 DIAGNOSIS — G8929 Other chronic pain: Secondary | ICD-10-CM

## 2021-02-26 DIAGNOSIS — M6281 Muscle weakness (generalized): Secondary | ICD-10-CM

## 2021-02-26 NOTE — Telephone Encounter (Signed)
Patient's daughter had been contacted and a message has been left that the patient has been set up for her Manometry and instructions have been mailed out and should also be seen on My Chart.

## 2021-02-26 NOTE — Patient Instructions (Signed)

## 2021-02-26 NOTE — Therapy (Signed)
[facility] [address]  [place], [place], [address] Phone: [phone]   Fax:  [phone]  Physical Therapy Treatment  Patient Details  Name: Kelsey Church MRN: [id number] Date of Birth: Dec 26, 1950 Referring Provider (PT): Gregor Hams   Encounter Date: 02/26/2021   PT End of Session - 02/26/21 0937     Visit Number 3    Number of Visits 12    Date for PT Re-Evaluation 03/30/21    Authorization Type Medicare + BCBS    PT Start Time 0932    PT Stop Time 1015    PT Time Calculation (min) 43 min    Activity Tolerance Patient tolerated treatment well    Behavior During Therapy WFL for tasks assessed/performed             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/10/13   Cancer ([place])    melanoma in 2 sights   Complication of anesthesia    "chills every evening at sundown x 2 weeks"- no fever   Depression    Dysphagia    History of kidney cancer    History of shingles    NO RESIDUAL PROBLEMS   Hypertension    Pain    LOWER BACK PAIN   Sleep apnea    CPAP    Past Surgical History:  Procedure Laterality Date   APPENDECTOMY  1958   COLONOSCOPY     COLONOSCOPY W/ POLYPECTOMY     7 polyps   FOOT SURGERY Right    removal plantar wart   GALLBLADDER SURGERY  02/14/1990   INJECTION KNEE  09/2011   right    KNEE ARTHROSCOPY  1996   LEFT HEART CATH AND CORONARY ANGIOGRAPHY N/A 07/11/2019   Procedure: LEFT HEART CATH AND CORONARY ANGIOGRAPHY;  Surgeon: Belva Crome, MD;  Location: [facility];  Service: Cardiovascular;  Laterality: N/A;   MELANOMA EXCISION  1992   right chin, back   NEPHRECTOMY Left 2022   POLYPECTOMY     SALPINGOOPHORECTOMY  2009   Right   TOTAL KNEE ARTHROPLASTY Right 06/10/2013   Procedure: RIGHT TOTAL KNEE ARTHROPLASTY;  Surgeon: [name], MD;  Location: [facility];  Service: Orthopedics;   Laterality: Right;   TOTAL KNEE ARTHROPLASTY Left 12/16/2013   Procedure: LEFT TOTAL KNEE ARTHROPLASTY;  Surgeon: [name], MD;  Location: [facility];  Service: Orthopedics;  Laterality: Left;    There were no vitals filed for this visit.   Subjective Assessment - 02/26/21 0936     Subjective Pt. reports she was busy yesterday, and had a rough night due to pain in L hip/buttock.    Pertinent History gastritis and GERD, recent weight loss, nephrectomy 2022, history cancer, history bil TKR, history cardiac disease.    Diagnostic tests diagnostic US, X-rays were unremarkable    Patient Stated Goals decrease L hip/back pain and improve back/hip strength    Currently in Pain? Yes    Pain Score 5     Pain Location Hip    Pain Orientation Left    Pain Descriptors / Indicators Nagging                               [facility] PT Treatment/Exercise - 02/26/21 0001       Exercises  Exercises Knee/Hip      Lumbar Exercises: Stretches   Lower Trunk Rotation Limitations 10 reps each way    Figure 4 Stretch 2 reps;20 seconds;Supine;With overpressure    Figure 4 Stretch Limitations reported discomfort L hip flexor    Other Lumbar Stretch Exercise hip flexor stretch L in supine x 30 sec      Lumbar Exercises: Aerobic   Nustep L5 x 6 min      Lumbar Exercises: Supine   Clam 10 reps    Clam Limitations RTB    Bridge 10 reps    Bridge Limitations 2 x 10 more reps progressed with RTB and clamshell      Lumbar Exercises: Sidelying   Clam Left;20 reps    Clam Limitations RTB      Manual Therapy   Manual Therapy Soft tissue mobilization;Myofascial release;Other (comment)    Manual therapy comments to decrease muscle spasm and pain    Soft tissue mobilization IASTM with stick to L glutes and L hip flexors    Myofascial Release TPR to L piriformis    Other Manual Therapy skilled palpation and monitoring with dry needling              Trigger Point Dry  Needling - 02/26/21 0001     Consent Given? Yes    Education Handout Provided Yes    Muscles Treated Back/Hip Piriformis;Gluteus medius    Dry Needling Comments left    Gluteus Medius Response Twitch response elicited;Palpable increased muscle length    Piriformis Response Twitch response elicited;Palpable increased muscle length                   PT Education - 02/26/21 1045     Education Details educated on risks and benefits of dry needling.    Person(s) Educated Patient    Methods Explanation;Handout    Comprehension Verbalized understanding              PT Short Term Goals - 02/24/21 1539       PT SHORT TERM GOAL #1   Title Patient to demonstrate independence with initial HEP.    Baseline Initial HEP given    Time 2    Period Weeks    Status On-going    Target Date 03/02/21               PT Long Term Goals - 02/24/21 1539       PT LONG TERM GOAL #1   Title Patient to be independent with advanced HEP.    Time 6    Period Weeks    Status On-going    Target Date 03/30/21      PT LONG TERM GOAL #2   Title Patient to demonstrate B LE strength >/=4+/5.    Time 6    Period Weeks    Status On-going    Target Date 03/30/21      PT LONG TERM GOAL #3   Title Patient will report no back/L hip pain at rest.    Baseline 5/10 at rest increases to 10/10 in morning.    Time 6    Period Weeks    Status On-going    Target Date 03/30/21      PT LONG TERM GOAL #4   Title Patient will be able to ascend/descend stairs with reciprocal step, 1 HR, and good confidence safely.    Baseline feels like her hip is not going to support her on stairs.  Time 6    Period Weeks    Status On-going    Target Date 03/30/21      PT LONG TERM GOAL #5   Title Pt. will demonstrate improved functional LE strength by completing 5x STS in <14 sec to decrease risk of falls.    Baseline 20 seconds, noted fatigue as progressed after 3 reps    Time 6    Period Weeks     Status On-going    Target Date 03/30/21                   Plan - 02/26/21 1041     Clinical Impression Statement Pt. tolerated progression of exercises today but reported L hip flexor tightness as well as pain in L glute/piriformis.  Manual therapy to L glutes and hip flexors, noted palpable trigger points in L glut med and L piriformis, so after education on risks and benefits for dry needling, she consented to dry needling these areas.  She reported decreased tightness but mild soreness afterwards, encouraged to gently move/stretch/and walk at home.  She would benefit from continued skilled therapy.    Personal Factors and Comorbidities Comorbidity 3+    Comorbidities history of cancer, heart disease, nephrectomy 2022, OA, history of bil knee replacements, GERD    PT Frequency 2x / week    PT Duration 6 weeks    PT Treatment/Interventions ADLs/Self Care Home Management;Cryotherapy;Electrical Stimulation;Iontophoresis 4mg /ml Dexamethasone;Moist Heat;Ultrasound;Gait training;Stair training;Functional mobility training;Therapeutic activities;Therapeutic exercise;Balance training;Neuromuscular re-education;Manual techniques;Passive range of motion;Dry needling;Taping;Spinal Manipulations;Joint Manipulations    PT Next Visit Plan progress core/glut strengthening at tolerated, manual/modalities PRN    PT Home Exercise Plan Access Code: [email or web address]    Consulted and Agree with Plan of Care Patient             Patient will benefit from skilled therapeutic intervention in order to improve the following deficits and impairments:  Decreased activity tolerance, Decreased range of motion, Decreased strength, Pain, Decreased balance, Decreased coordination, Decreased mobility, Increased muscle spasms, Impaired flexibility  Visit Diagnosis: Pain in left hip  Stiffness of left hip, not elsewhere classified  Chronic bilateral low back pain without sciatica  Muscle weakness  (generalized)     Problem List Patient Active Problem List   Diagnosis Date Noted   Viral URI 01/20/2021   Dysphagia    Hyperlipidemia LDL goal <70 12/07/2020   Atherosclerotic heart disease of native coronary artery with other forms of angina pectoris ([place])    Symptomatic PVCs 12/26/2018   Stage 3a chronic kidney disease ([place]) 12/26/2018   Degenerative lumbar disc 11/01/2016   Gastroesophageal reflux disease with esophagitis 12/10/2014   Moderate osteopenia 12/10/2014   Vitamin B12 deficiency 12/10/2014   OA (osteoarthritis) of knee 12/16/2013   Prediabetes 12/03/2013   Other screening mammogram 09/25/2012   DJD (degenerative joint disease) of knee 09/09/2011   OSA (obstructive sleep apnea) 07/29/2011   Essential hypertension, benign 07/29/2011    Rennie Natter, PT, DPT  02/26/2021, 10:45 AM  [facility] [address]  [place], [place], [address] Phone: [phone]   Fax:  [phone]  Name: ARSENIA GORACKE MRN: [id number] Date of Birth: [birth year]

## 2021-03-02 ENCOUNTER — Encounter: Payer: Self-pay | Admitting: Physical Therapy

## 2021-03-02 ENCOUNTER — Ambulatory Visit (INDEPENDENT_AMBULATORY_CARE_PROVIDER_SITE_OTHER): Payer: Medicare Other | Admitting: Family Medicine

## 2021-03-02 ENCOUNTER — Other Ambulatory Visit: Payer: Self-pay

## 2021-03-02 ENCOUNTER — Ambulatory Visit: Payer: Medicare Other | Admitting: Physical Therapy

## 2021-03-02 ENCOUNTER — Encounter: Payer: Self-pay | Admitting: Family Medicine

## 2021-03-02 VITALS — BP 108/70 | HR 67 | Temp 97.7°F | Resp 18 | Ht 66.0 in | Wt 164.0 lb

## 2021-03-02 DIAGNOSIS — M545 Low back pain, unspecified: Secondary | ICD-10-CM | POA: Diagnosis not present

## 2021-03-02 DIAGNOSIS — M25652 Stiffness of left hip, not elsewhere classified: Secondary | ICD-10-CM | POA: Diagnosis not present

## 2021-03-02 DIAGNOSIS — R634 Abnormal weight loss: Secondary | ICD-10-CM | POA: Diagnosis not present

## 2021-03-02 DIAGNOSIS — K2100 Gastro-esophageal reflux disease with esophagitis, without bleeding: Secondary | ICD-10-CM | POA: Diagnosis not present

## 2021-03-02 DIAGNOSIS — G8929 Other chronic pain: Secondary | ICD-10-CM | POA: Diagnosis not present

## 2021-03-02 DIAGNOSIS — R110 Nausea: Secondary | ICD-10-CM | POA: Diagnosis not present

## 2021-03-02 DIAGNOSIS — K219 Gastro-esophageal reflux disease without esophagitis: Secondary | ICD-10-CM

## 2021-03-02 DIAGNOSIS — E785 Hyperlipidemia, unspecified: Secondary | ICD-10-CM | POA: Diagnosis not present

## 2021-03-02 DIAGNOSIS — M6281 Muscle weakness (generalized): Secondary | ICD-10-CM | POA: Diagnosis not present

## 2021-03-02 DIAGNOSIS — M25552 Pain in left hip: Secondary | ICD-10-CM | POA: Diagnosis not present

## 2021-03-02 NOTE — Patient Instructions (Signed)
Food Choices for Gastroesophageal Reflux Disease, Adult °When you have gastroesophageal reflux disease (GERD), the foods you eat and your eating habits are very important. Choosing the right foods can help ease the discomfort of GERD. Consider working with a dietitian to help you make healthy food choices. °What are tips for following this plan? °Reading food labels °Look for foods that are low in saturated fat. Foods that have less than 5% of daily value (DV) of fat and 0 g of trans fats may help with your symptoms. °Cooking °Cook foods using methods other than frying. This may include baking, steaming, grilling, or broiling. These are all methods that do not need a lot of fat for cooking. °To add flavor, try to use herbs that are low in spice and acidity. °Meal planning ° °Choose healthy foods that are low in fat, such as fruits, vegetables, whole grains, low-fat dairy products, lean meats, fish, and poultry. °Eat frequent, small meals instead of three large meals each day. Eat your meals slowly, in a relaxed setting. Avoid bending over or lying down until 2-3 hours after eating. °Limit high-fat foods such as fatty meats or fried foods. °Limit your intake of fatty foods, such as oils, butter, and shortening. °Avoid the following as told by your health care provider: °Foods that cause symptoms. These may be different for different people. Keep a food diary to keep track of foods that cause symptoms. °Alcohol. °Drinking large amounts of liquid with meals. °Eating meals during the 2-3 hours before bed. °Lifestyle °Maintain a healthy weight. Ask your health care provider what weight is healthy for you. If you need to lose weight, work with your health care provider to do so safely. °Exercise for at least 30 minutes on 5 or more days each week, or as told by your health care provider. °Avoid wearing clothes that fit tightly around your waist and chest. °Do not use any products that contain nicotine or tobacco. These  products include cigarettes, chewing tobacco, and vaping devices, such as e-cigarettes. If you need help quitting, ask your health care provider. °Sleep with the head of your bed raised. Use a wedge under the mattress or blocks under the bed frame to raise the head of the bed. °Chew sugar-free gum after mealtimes. °What foods should I eat? °Eat a healthy, well-balanced diet of fruits, vegetables, whole grains, low-fat dairy products, lean meats, fish, and poultry. Each person is different. Foods that may trigger symptoms in one person may not trigger any symptoms in another person. Work with your health care provider to identify foods that are safe for you. °The items listed above may not be a complete list of recommended foods and beverages. Contact a dietitian for more information. °What foods should I avoid? °Limiting some of these foods may help manage the symptoms of GERD. Everyone is different. Consult a dietitian or your health care provider to help you identify the exact foods to avoid, if any. °Fruits °Any fruits prepared with added fat. Any fruits that cause symptoms. For some people this may include citrus fruits, such as oranges, grapefruit, pineapple, and lemons. °Vegetables °Deep-fried vegetables. French fries. Any vegetables prepared with added fat. Any vegetables that cause symptoms. For some people, this may include tomatoes and tomato products, chili peppers, onions and garlic, and horseradish. °Grains °Pastries or quick breads with added fat. °Meats and other proteins °High-fat meats, such as fatty beef or pork, hot dogs, ribs, ham, sausage, salami, and bacon. Fried meat or protein, including fried   fish and fried chicken. Nuts and nut butters, in large amounts. °Dairy °Whole milk and chocolate milk. Sour cream. Cream. Ice cream. Cream cheese. Milkshakes. °Fats and oils °Butter. Margarine. Shortening. Ghee. °Beverages °Coffee and tea, with or without caffeine. Carbonated beverages. Sodas. Energy  drinks. Fruit juice made with acidic fruits, such as orange or grapefruit. Tomato juice. Alcoholic drinks. °Sweets and desserts °Chocolate and cocoa. Donuts. °Seasonings and condiments °Pepper. Peppermint and spearmint. Added salt. Any condiments, herbs, or seasonings that cause symptoms. For some people, this may include curry, hot sauce, or vinegar-based salad dressings. °The items listed above may not be a complete list of foods and beverages to avoid. Contact a dietitian for more information. °Questions to ask your health care provider °Diet and lifestyle changes are usually the first steps that are taken to manage symptoms of GERD. If diet and lifestyle changes do not improve your symptoms, talk with your health care provider about taking medicines. °Where to find more information °[employer]intestinal [employer]: aboutgerd.org °Summary °When you have gastroesophageal reflux disease (GERD), food and lifestyle choices may be very helpful in easing the discomfort of GERD. °Eat frequent, small meals instead of three large meals each day. Eat your meals slowly, in a relaxed setting. Avoid bending over or lying down until 2-3 hours after eating. °Limit high-fat foods such as fatty meats or fried foods. °This information is not intended to replace advice given to you by your health care provider. Make sure you discuss any questions you have with your health care provider. °Document Revised: 08/12/2019 Document Reviewed: 08/12/2019 °Elsevier Patient Education © 2022 [employer] ° °

## 2021-03-02 NOTE — Progress Notes (Addendum)
Subjective:   By signing my name below, I, [name], attest that this documentation has been prepared under the direction and in the presence of Dr. Roma Schanz, DO. 03/02/2021    Patient ID: Kelsey Church, female    DOB: Jan 29, 1951, 71 y.o.   MRN: [id number]  Chief Complaint  Patient presents with   Establish Care    HPI Patient is in today for a new patient visit. Her daughter is present during this visit.  She reports having abdominal pain and loss of appetitive for the past couple of months. She has difficulty swallowing at this time which is causing her loss of appetite. She gets nauseas easily when eating. She is also experiencing frequent heart burn and is taking Prilosec to manage her symptoms. She has currently lost 44 lb's since August, 2022. Her weight has stabilized around 160 lb's. She has an upcomming appointment with a GI specialist to manage her symptoms.  She is seeing her nephrologist regularly every 3 months. 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  She is taking 40 mg Protonix 2x daily PO, 25 mg metoprolol succinate daily PO, 20 mg Celexa daily PO, and reports no new issues while taking it.  Her previous cardiologist told her she no longer needs to take repatha to manage her cholesterol since she was doing well while taking 5 mg Crestor daily PO.  Her Hemoglobin levels were 13.2, Creatinine was 2.1, GFR was 25 during her last lab work on December, 2022 at her old providers office.    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/10/13   Cancer ([place])    melanoma in 2 sights   Complication of anesthesia    "chills every evening at sundown x 2 weeks"- no fever   Depression    Dysphagia    History of kidney cancer    History of shingles    NO RESIDUAL PROBLEMS   Hypertension    Pain    LOWER BACK PAIN   Sleep apnea    CPAP    Past Surgical  History:  Procedure Laterality Date   APPENDECTOMY  1958   COLONOSCOPY     COLONOSCOPY W/ POLYPECTOMY     7 polyps   FOOT SURGERY Right    removal plantar wart   GALLBLADDER SURGERY  02/14/1990   INJECTION KNEE  09/2011   right    KNEE ARTHROSCOPY  1996   LEFT HEART CATH AND CORONARY ANGIOGRAPHY N/A 07/11/2019   Procedure: LEFT HEART CATH AND CORONARY ANGIOGRAPHY;  Surgeon: Belva Crome, MD;  Location: [facility];  Service: Cardiovascular;  Laterality: N/A;   MELANOMA EXCISION  1992   right chin, back   NEPHRECTOMY Left 2022   POLYPECTOMY     SALPINGOOPHORECTOMY  2009   Right   TOTAL KNEE ARTHROPLASTY Right 06/10/2013   Procedure: RIGHT TOTAL KNEE ARTHROPLASTY;  Surgeon: [name], MD;  Location: [facility];  Service: Orthopedics;  Laterality: Right;   TOTAL KNEE ARTHROPLASTY Left 12/16/2013   Procedure: LEFT TOTAL KNEE ARTHROPLASTY;  Surgeon: [name], MD;  Location: [facility];  Service: Orthopedics;  Laterality: Left;    Family History  Problem Relation Age of Onset   Heart disease Father    Heart attack Father 55       53   Hypertension Father  Osteoporosis Mother    Breast cancer Mother    Cancer Maternal Grandmother        Breast and Uterine Cancer   Breast cancer Maternal Grandmother    Thyroid cancer Maternal Grandmother    Hypertension Sister    Diabetes Sister    Colon polyps Sister    Colon cancer Neg Hx    Rectal cancer Neg Hx    Stomach cancer Neg Hx    Esophageal cancer Neg Hx    Allergic rhinitis Neg Hx    Asthma Neg Hx    Eczema Neg Hx    Urticaria Neg Hx     Social History   Socioeconomic History   Marital status: Widowed    Spouse name: Not on file   Number of children: 1   Years of education: Not on file   Highest education level: Not on file  Occupational History   Occupation: CMA    Employer:   Tobacco Use   Smoking status: Never   Smokeless tobacco: Never  Vaping Use   Vaping Use: Never used   Substance and Sexual Activity   Alcohol use: No    Alcohol/week: 0.0 standard drinks   Drug use: No   Sexual activity: Not Currently    Comment: 1st intercourse- 18, partners- 1,   Other Topics Concern   Not on file  Social History Narrative   Lives alone.  Widow.  One son.    Caffienated drinks-no   Seat belt use often-yes   Regular Exercise-yes   Smoke alarm in the home-yes   Firearms/guns in the home-no   History of physical abuse-no            Social Determinants of Health   Financial Resource Strain: Not on file  Food Insecurity: Not on file  Transportation Needs: Not on file  Physical Activity: Not on file  Stress: Not on file  Social Connections: Not on file  Intimate Partner Violence: Not on file    Outpatient Medications Prior to Visit  Medication Sig Dispense Refill   acetaminophen (TYLENOL) 650 MG CR tablet Take 1,300 mg by mouth every 8 (eight) hours as needed for pain.      aspirin EC 81 MG tablet Take 1 tablet (81 mg total) by mouth daily. 90 tablet 3   cetirizine (ZYRTEC) 10 MG tablet Take 10 mg by mouth daily as needed for allergies.     citalopram (CELEXA) 20 MG tablet TAKE 1 TABLET AT BEDTIME 90 tablet 1   metoprolol succinate (TOPROL-XL) 25 MG 24 hr tablet Take 2 tablets (50 mg total) by mouth in the morning AND 1 tablet (25 mg total) every evening. 270 tablet 3   nitroGLYCERIN (NITROSTAT) 0.4 MG SL tablet Place 1 tablet (0.4 mg total) under the tongue every 5 (five) minutes as needed for chest pain. 25 tablet 3   pantoprazole (PROTONIX) 40 MG tablet Take 1 tablet (40 mg total) by mouth 2 (two) times daily before a meal. 60 tablet 3   ranolazine (RANEXA) 500 MG 12 hr tablet Take 1 tablet by mouth 2 (two) times daily.     COVID-19 mRNA bivalent vaccine, Pfizer, (PFIZER COVID-19 VAC BIVALENT) injection Inject into the muscle. (Patient not taking: Reported on 03/02/2021) 0.3 mL 0   No facility-administered medications prior to visit.    Allergies   Allergen Reactions   Atorvastatin    Rosuvastatin     40mg --Creatinine elevation    Review of Systems  Constitutional:  Negative for fever and malaise/fatigue.       (+)loss of appetite  HENT:  Negative for congestion.   Eyes:  Negative for blurred vision.  Respiratory:  Negative for shortness of breath.   Cardiovascular:  Negative for chest pain, palpitations and leg swelling.  Gastrointestinal:  Positive for abdominal pain, heartburn, nausea and vomiting. Negative for blood in stool.  Genitourinary:  Negative for dysuria and frequency.  Musculoskeletal:  Negative for falls.  Skin:  Negative for rash.  Neurological:  Negative for dizziness, loss of consciousness and headaches.  Endo/Heme/Allergies:  Negative for environmental allergies.  Psychiatric/Behavioral:  Negative for depression. The patient is not nervous/anxious.       Objective:    Physical Exam Vitals and nursing note reviewed.  Constitutional:      General: She is not in acute distress.    Appearance: Normal appearance. She is not ill-appearing.  HENT:     Head: Normocephalic and atraumatic.     Right Ear: External ear normal.     Left Ear: External ear normal.  Eyes:     Extraocular Movements: Extraocular movements intact.     Pupils: Pupils are equal, round, and reactive to light.  Cardiovascular:     Rate and Rhythm: Normal rate and regular rhythm.     Heart sounds: Normal heart sounds. No murmur heard.   No gallop.  Pulmonary:     Effort: Pulmonary effort is normal. No respiratory distress.     Breath sounds: Normal breath sounds. No wheezing or rales.  Skin:    General: Skin is warm and dry.  Neurological:     Mental Status: She is alert and oriented to person, place, and time.  Psychiatric:        Behavior: Behavior normal.        Judgment: Judgment normal.    BP 108/70 (BP Location: Left Arm, Patient Position: Sitting, Cuff Size: Normal)    Pulse 67    Temp 97.7 F (36.5 C) (Oral)    Resp 18     Ht 5\' 6"  (1.676 m)    Wt 164 lb (74.4 kg)    SpO2 96%    BMI 26.47 kg/m  Wt Readings from Last 3 Encounters:  03/02/21 164 lb (74.4 kg)  01/28/21 166 lb 3.2 oz (75.4 kg)  01/19/21 167 lb 12.8 oz (76.1 kg)    Diabetic Foot Exam - Simple   No data filed    Lab Results  Component Value Date   WBC 6.5 08/10/2020   HGB 10.9 (L) 08/10/2020   HCT 32.5 (L) 08/10/2020   PLT 226.0 08/10/2020   GLUCOSE 102 (H) 08/05/2020   CHOL 119 12/07/2020   TRIG 155.0 (H) 12/07/2020   HDL 46.90 12/07/2020   LDLCALC 41 12/07/2020   ALT 22 12/26/2018   AST 17 12/26/2018   NA 139 08/05/2020   K 4.4 08/05/2020   CL 103 08/05/2020   CREATININE 2.38 (H) 08/05/2020   BUN 35 (H) 08/05/2020   CO2 26 08/05/2020   TSH 1.54 08/05/2020   INR 1.17 12/27/2013   HGBA1C 5.9 08/27/2020    Lab Results  Component Value Date   TSH 1.54 08/05/2020   Lab Results  Component Value Date   WBC 6.5 08/10/2020   HGB 10.9 (L) 08/10/2020   HCT 32.5 (L) 08/10/2020   MCV 87.8 08/10/2020   PLT 226.0 08/10/2020   Lab Results  Component Value Date   NA 139 08/05/2020   K 4.4 08/05/2020  CO2 26 08/05/2020   GLUCOSE 102 (H) 08/05/2020   BUN 35 (H) 08/05/2020   CREATININE 2.38 (H) 08/05/2020   BILITOT 0.4 12/26/2018   ALKPHOS 73 12/26/2018   AST 17 12/26/2018   ALT 22 12/26/2018   PROT 7.1 12/26/2018   ALBUMIN 4.2 08/05/2020   CALCIUM 9.7 08/05/2020   ANIONGAP 11 12/27/2013   GFR 20.24 (L) 08/05/2020   Lab Results  Component Value Date   CHOL 119 12/07/2020   Lab Results  Component Value Date   HDL 46.90 12/07/2020   Lab Results  Component Value Date   LDLCALC 41 12/07/2020   Lab Results  Component Value Date   TRIG 155.0 (H) 12/07/2020   Lab Results  Component Value Date   CHOLHDL 3 12/07/2020   Lab Results  Component Value Date   HGBA1C 5.9 08/27/2020       Assessment & Plan:   Problem List Items Addressed This Visit       Unprioritized   Gastroesophageal reflux disease with  esophagitis    con't protonix bid  F/u GI       Hyperlipidemia LDL goal <70    Pt off statin due to declining kidney function        Other Visit Diagnoses     Hyperlipidemia, unspecified hyperlipidemia type    -  Primary   Relevant Orders   CBC with Differential/Platelet   Comprehensive metabolic panel   Lipid panel   Gastroesophageal reflux disease, unspecified whether esophagitis present       Nausea       Weight loss            No orders of the defined types were placed in this encounter.   I, Dr. Roma Schanz, DO, personally preformed the services described in this documentation.  All medical record entries made by the scribe were at my direction and in my presence.  I have reviewed the chart and discharge instructions (if applicable) and agree that the record reflects my personal performance and is accurate and complete. 03/02/2021   I,[name],acting as a scribe for Ann Held, DO.,have documented all relevant documentation on the behalf of Ann Held, DO,as directed by  Ann Held, DO while in the presence of Ann Held, DO.   Ann Held, DO

## 2021-03-02 NOTE — Therapy (Signed)
[facility] [address]  [place], [place], [address] Phone: [phone]   Fax:  [phone]  Physical Therapy Treatment  Patient Details  Name: Kelsey Church MRN: [id number] Date of Birth: Oct 09, 1950 Referring Provider (PT): Gregor Hams   Encounter Date: 03/02/2021   PT End of Session - 03/02/21 1348     Visit Number 4    Number of Visits 12    Date for PT Re-Evaluation 03/30/21    Authorization Type Medicare + BCBS    PT Start Time 1348    PT Stop Time 1429    PT Time Calculation (min) 41 min    Activity Tolerance Patient tolerated treatment well    Behavior During Therapy WFL for tasks assessed/performed             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/10/13   Cancer ([place])    melanoma in 2 sights   Complication of anesthesia    "chills every evening at sundown x 2 weeks"- no fever   Depression    Dysphagia    History of kidney cancer    History of shingles    NO RESIDUAL PROBLEMS   Hypertension    Pain    LOWER BACK PAIN   Sleep apnea    CPAP    Past Surgical History:  Procedure Laterality Date   APPENDECTOMY  1958   COLONOSCOPY     COLONOSCOPY W/ POLYPECTOMY     7 polyps   FOOT SURGERY Right    removal plantar wart   GALLBLADDER SURGERY  02/14/1990   INJECTION KNEE  09/2011   right    KNEE ARTHROSCOPY  1996   LEFT HEART CATH AND CORONARY ANGIOGRAPHY N/A 07/11/2019   Procedure: LEFT HEART CATH AND CORONARY ANGIOGRAPHY;  Surgeon: Belva Crome, MD;  Location: [facility];  Service: Cardiovascular;  Laterality: N/A;   MELANOMA EXCISION  1992   right chin, back   NEPHRECTOMY Left 2022   POLYPECTOMY     SALPINGOOPHORECTOMY  2009   Right   TOTAL KNEE ARTHROPLASTY Right 06/10/2013   Procedure: RIGHT TOTAL KNEE ARTHROPLASTY;  Surgeon: [name], MD;  Location: [facility];  Service: Orthopedics;   Laterality: Right;   TOTAL KNEE ARTHROPLASTY Left 12/16/2013   Procedure: LEFT TOTAL KNEE ARTHROPLASTY;  Surgeon: [name], MD;  Location: [facility];  Service: Orthopedics;  Laterality: Left;    There were no vitals filed for this visit.   Subjective Assessment - 03/02/21 1348     Subjective DN really helped. I can lie on that side now.    Pertinent History gastritis and GERD, recent weight loss, nephrectomy 2022, history cancer, history bil TKR, history cardiac disease.    Patient Stated Goals decrease L hip/back pain and improve back/hip strength    Currently in Pain? Yes    Pain Score 2     Pain Location Buttocks    Pain Orientation Left    Pain Descriptors / Indicators Sore    Pain Type Chronic pain                               [facility] Adult PT Treatment/Exercise - 03/02/21 0001       Lumbar Exercises: Stretches   Lower Trunk Rotation Limitations 10 reps  each way with feet in wide stance    Hip Flexor Stretch Left;2 reps    Hip Flexor Stretch Limitations seated    Quad Stretch Right;Left;2 reps;30 seconds    Quad Stretch Limitations prone with strap    Figure 4 Stretch 2 reps;30 seconds;Seated;With overpressure    Figure 4 Stretch Limitations left      Lumbar Exercises: Aerobic   Nustep L5 x 6 min      Lumbar Exercises: Supine   Bridge 10 reps;5 seconds    Bridge Limitations red band around thighs with isometric hold    Bridge with clamshell 20 reps    Bridge with [employer] Limitations red tb      Lumbar Exercises: Sidelying   Clam Left;20 reps    Clam Limitations RTB    Hip Abduction Left;20 reps                     PT Education - 03/02/21 1431     Education Details HEP progressed/modified    Person(s) Educated Patient    Methods Explanation;Demonstration;Handout    Comprehension Verbalized understanding;Returned demonstration              PT Short Term Goals - 03/02/21 1433       PT SHORT TERM GOAL #1   Title  Patient to demonstrate independence with initial HEP.    Status Achieved               PT Long Term Goals - 02/24/21 1539       PT LONG TERM GOAL #1   Title Patient to be independent with advanced HEP.    Time 6    Period Weeks    Status On-going    Target Date 03/30/21      PT LONG TERM GOAL #2   Title Patient to demonstrate B LE strength >/=4+/5.    Time 6    Period Weeks    Status On-going    Target Date 03/30/21      PT LONG TERM GOAL #3   Title Patient will report no back/L hip pain at rest.    Baseline 5/10 at rest increases to 10/10 in morning.    Time 6    Period Weeks    Status On-going    Target Date 03/30/21      PT LONG TERM GOAL #4   Title Patient will be able to ascend/descend stairs with reciprocal step, 1 HR, and good confidence safely.    Baseline feels like her hip is not going to support her on stairs.    Time 6    Period Weeks    Status On-going    Target Date 03/30/21      PT LONG TERM GOAL #5   Title Pt. will demonstrate improved functional LE strength by completing 5x STS in <14 sec to decrease risk of falls.    Baseline 20 seconds, noted fatigue as progressed after 3 reps    Time 6    Period Weeks    Status On-going    Target Date 03/30/21                   Plan - 03/02/21 1431     Clinical Impression Statement Patient reporting significant improvements since DN last visit. She is now able to sleep on her left side with minimal pain and pain overall is down. She was able to tolerate increased strenghening in left hip today without complaints  as well. She is compliant with HEP.    Comorbidities history of cancer, heart disease, nephrectomy 2022, OA, history of bil knee replacements, GERD    PT Frequency 2x / week    PT Duration 6 weeks    PT Treatment/Interventions ADLs/Self Care Home Management;Cryotherapy;Electrical Stimulation;Iontophoresis 4mg /ml Dexamethasone;Moist Heat;Ultrasound;Gait training;Stair training;Functional  mobility training;Therapeutic activities;Therapeutic exercise;Balance training;Neuromuscular re-education;Manual techniques;Passive range of motion;Dry needling;Taping;Spinal Manipulations;Joint Manipulations    PT Next Visit Plan progress core/glut strengthening at tolerated, manual/modalities PRN    PT Home Exercise Plan Access Code: [email or web address]    Consulted and Agree with Plan of Care Patient             Patient will benefit from skilled therapeutic intervention in order to improve the following deficits and impairments:  Decreased activity tolerance, Decreased range of motion, Decreased strength, Pain, Decreased balance, Decreased coordination, Decreased mobility, Increased muscle spasms, Impaired flexibility  Visit Diagnosis: Pain in left hip  Stiffness of left hip, not elsewhere classified     Problem List Patient Active Problem List   Diagnosis Date Noted   Viral URI 01/20/2021   Dysphagia    Hyperlipidemia LDL goal <70 12/07/2020   Atherosclerotic heart disease of native coronary artery with other forms of angina pectoris ([place])    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  [place], [place], [address] Phone: [phone]   Fax:  [phone]  Name: Kelsey Church MRN: [id number] Date of Birth: [birth year]

## 2021-03-02 NOTE — Assessment & Plan Note (Signed)
con't protonix bid  F/u GI

## 2021-03-02 NOTE — Assessment & Plan Note (Signed)
Pt off statin due to declining kidney function

## 2021-03-02 NOTE — Patient Instructions (Signed)
Access Code: [email or web address] URL: [email or web address] Date: 03/02/2021 Prepared by: Almyra Free  Exercises Supine Lower Trunk Rotation - 1 x daily - 7 x weekly - 2 sets - 10 reps Supine Posterior Pelvic Tilt - 1 x daily - 7 x weekly - 2 sets - 10 reps Sit to Stand - 1 x daily - 7 x weekly - 2 sets - 10 reps Bridge with Hip Abduction and Resistance - 1 x daily - 7 x weekly - 3 sets - 10 reps Clamshell with Resistance - 1 x daily - 7 x weekly - 3 sets - 10 reps Seated Hip Flexor Stretch (Mirrored) - 2 x daily - 7 x weekly - 1 sets - 3 reps - 30-60 sec hold Supine Hip Internal and External Rotation - 2 x daily - 7 x weekly - 2 sets - 10 reps - 3 sec hold

## 2021-03-03 ENCOUNTER — Encounter: Payer: Self-pay | Admitting: Family Medicine

## 2021-03-03 LAB — CBC WITH DIFFERENTIAL/PLATELET
Basophils Absolute: 0 10*3/uL (ref 0.0–0.1)
Basophils Relative: 0.5 % (ref 0.0–3.0)
Eosinophils Absolute: 0.1 10*3/uL (ref 0.0–0.7)
Eosinophils Relative: 1.3 % (ref 0.0–5.0)
HCT: 38.7 % (ref 36.0–46.0)
Hemoglobin: 12.6 g/dL (ref 12.0–15.0)
Lymphocytes Relative: 16.4 % (ref 12.0–46.0)
Lymphs Abs: 1.2 10*3/uL (ref 0.7–4.0)
MCHC: 32.6 g/dL (ref 30.0–36.0)
MCV: 89.8 fl (ref 78.0–100.0)
Monocytes Absolute: 0.5 10*3/uL (ref 0.1–1.0)
Monocytes Relative: 7.6 % (ref 3.0–12.0)
Neutro Abs: 5.3 10*3/uL (ref 1.4–7.7)
Neutrophils Relative %: 74.2 % (ref 43.0–77.0)
Platelets: 277 10*3/uL (ref 150.0–400.0)
RBC: 4.31 Mil/uL (ref 3.87–5.11)
RDW: 15.4 % (ref 11.5–15.5)
WBC: 7.1 10*3/uL (ref 4.0–10.5)

## 2021-03-03 LAB — COMPREHENSIVE METABOLIC PANEL
ALT: 13 U/L (ref 0–35)
AST: 14 U/L (ref 0–37)
Albumin: 4.2 g/dL (ref 3.5–5.2)
Alkaline Phosphatase: 79 U/L (ref 39–117)
BUN: 30 mg/dL — ABNORMAL HIGH (ref 6–23)
CO2: 27 mEq/L (ref 19–32)
Calcium: 9.3 mg/dL (ref 8.4–10.5)
Chloride: 105 mEq/L (ref 96–112)
Creatinine, Ser: 2.06 mg/dL — ABNORMAL HIGH (ref 0.40–1.20)
GFR: 23.97 mL/min — ABNORMAL LOW (ref >60.00)
Glucose, Bld: 76 mg/dL (ref 70–99)
Potassium: 4 mEq/L (ref 3.5–5.1)
Sodium: 141 mEq/L (ref 135–145)
Total Bilirubin: 0.4 mg/dL (ref 0.2–1.2)
Total Protein: 6.5 g/dL (ref 6.0–8.3)

## 2021-03-03 LAB — LIPID PANEL
Cholesterol: 232 mg/dL — ABNORMAL HIGH (ref 0–200)
HDL: 53.7 mg/dL (ref >39.00)
LDL Cholesterol: 149 mg/dL — ABNORMAL HIGH (ref 0–99)
NonHDL: 178.26
Total CHOL/HDL Ratio: 4
Triglycerides: 147 mg/dL (ref 0.0–149.0)
VLDL: 29.4 mg/dL (ref 0.0–40.0)

## 2021-03-04 ENCOUNTER — Encounter: Payer: Self-pay | Admitting: Family Medicine

## 2021-03-04 ENCOUNTER — Encounter: Payer: Medicare Other | Admitting: Physical Therapy

## 2021-03-04 NOTE — Telephone Encounter (Signed)
I have faxed her labs to her Cardiologist

## 2021-03-08 ENCOUNTER — Ambulatory Visit: Payer: Medicare Other

## 2021-03-08 ENCOUNTER — Other Ambulatory Visit: Payer: Self-pay

## 2021-03-08 DIAGNOSIS — M6281 Muscle weakness (generalized): Secondary | ICD-10-CM

## 2021-03-08 DIAGNOSIS — G8929 Other chronic pain: Secondary | ICD-10-CM | POA: Diagnosis not present

## 2021-03-08 DIAGNOSIS — M25552 Pain in left hip: Secondary | ICD-10-CM | POA: Diagnosis not present

## 2021-03-08 DIAGNOSIS — M25652 Stiffness of left hip, not elsewhere classified: Secondary | ICD-10-CM | POA: Diagnosis not present

## 2021-03-08 DIAGNOSIS — M545 Low back pain, unspecified: Secondary | ICD-10-CM

## 2021-03-08 NOTE — Therapy (Signed)
[facility] [address]  [place], [place], [address] Phone: [phone]   Fax:  [phone]  Physical Therapy Treatment  Patient Details  Name: Kelsey Church MRN: [id number] Date of Birth: November 23, 1950 Referring Provider (PT): Gregor Hams   Encounter Date: 03/08/2021   PT End of Session - 03/08/21 1201     Visit Number 5    Number of Visits 12    Date for PT Re-Evaluation 03/30/21    Authorization Type Medicare + BCBS    PT Start Time 1104    PT Stop Time 1147    PT Time Calculation (min) 43 min    Activity Tolerance Patient tolerated treatment well    Behavior During Therapy WFL for tasks assessed/performed             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/10/13   Cancer ([place])    melanoma in 2 sights   Complication of anesthesia    "chills every evening at sundown x 2 weeks"- no fever   Depression    Dysphagia    History of kidney cancer    History of shingles    NO RESIDUAL PROBLEMS   Hypertension    Pain    LOWER BACK PAIN   Sleep apnea    CPAP    Past Surgical History:  Procedure Laterality Date   APPENDECTOMY  1958   COLONOSCOPY     COLONOSCOPY W/ POLYPECTOMY     7 polyps   FOOT SURGERY Right    removal plantar wart   GALLBLADDER SURGERY  02/14/1990   INJECTION KNEE  09/2011   right    KNEE ARTHROSCOPY  1996   LEFT HEART CATH AND CORONARY ANGIOGRAPHY N/A 07/11/2019   Procedure: LEFT HEART CATH AND CORONARY ANGIOGRAPHY;  Surgeon: Belva Crome, MD;  Location: [facility];  Service: Cardiovascular;  Laterality: N/A;   MELANOMA EXCISION  1992   right chin, back   NEPHRECTOMY Left 2022   POLYPECTOMY     SALPINGOOPHORECTOMY  2009   Right   TOTAL KNEE ARTHROPLASTY Right 06/10/2013   Procedure: RIGHT TOTAL KNEE ARTHROPLASTY;  Surgeon: [name], MD;  Location: [facility];  Service: Orthopedics;   Laterality: Right;   TOTAL KNEE ARTHROPLASTY Left 12/16/2013   Procedure: LEFT TOTAL KNEE ARTHROPLASTY;  Surgeon: [name], MD;  Location: [facility];  Service: Orthopedics;  Laterality: Left;    There were no vitals filed for this visit.   Subjective Assessment - 03/08/21 1108     Subjective Pt feels tired this morning, having a little bit of pain but more pain when trying to lay on her R side.    Pertinent History gastritis and GERD, recent weight loss, nephrectomy 2022, history cancer, history bil TKR, history cardiac disease.    Diagnostic tests diagnostic US, X-rays were unremarkable    Patient Stated Goals decrease L hip/back pain and improve back/hip strength    Currently in Pain? Yes    Pain Score 4     Pain Location Buttocks    Pain Orientation Left    Pain Descriptors / Indicators Sore    Pain Type Chronic pain                               [facility] Adult  PT Treatment/Exercise - 03/08/21 0001       Lumbar Exercises: Stretches   Active Hamstring Stretch Left;2 reps;30 seconds    Active Hamstring Stretch Limitations seated hip hinge    Hip Flexor Stretch Limitations reviewed    Figure 4 Stretch 2 reps;30 seconds;Seated;With overpressure    Figure 4 Stretch Limitations left with hip hinge      Lumbar Exercises: Aerobic   Recumbent Bike L1x66min      Lumbar Exercises: Seated   Long Arc Quad on Chair Strengthening;Both;10 reps    LAQ on Chair Limitations with VMO ball squeeze    Other Seated Lumbar Exercises hip ADD with ball squeeze 10x5"      Knee/Hip Exercises: Seated   Clamshell with TheraBand Red   10x3", R/L     Manual Therapy   Manual Therapy Soft tissue mobilization    Soft tissue mobilization STM to R glute med/min                       PT Short Term Goals - 03/02/21 1433       PT SHORT TERM GOAL #1   Title Patient to demonstrate independence with initial HEP.    Status Achieved               PT Long Term  Goals - 02/24/21 1539       PT LONG TERM GOAL #1   Title Patient to be independent with advanced HEP.    Time 6    Period Weeks    Status On-going    Target Date 03/30/21      PT LONG TERM GOAL #2   Title Patient to demonstrate B LE strength >/=4+/5.    Time 6    Period Weeks    Status On-going    Target Date 03/30/21      PT LONG TERM GOAL #3   Title Patient will report no back/L hip pain at rest.    Baseline 5/10 at rest increases to 10/10 in morning.    Time 6    Period Weeks    Status On-going    Target Date 03/30/21      PT LONG TERM GOAL #4   Title Patient will be able to ascend/descend stairs with reciprocal step, 1 HR, and good confidence safely.    Baseline feels like her hip is not going to support her on stairs.    Time 6    Period Weeks    Status On-going    Target Date 03/30/21      PT LONG TERM GOAL #5   Title Pt. will demonstrate improved functional LE strength by completing 5x STS in <14 sec to decrease risk of falls.    Baseline 20 seconds, noted fatigue as progressed after 3 reps    Time 6    Period Weeks    Status On-going    Target Date 03/30/21                   Plan - 03/08/21 1202     Clinical Impression Statement Pt reports improvements overall in hip and lower back pain. Still experiencing stiffness and soreness in the R glute med area but better than before. She notes that sleepig still causes pain, which is most likely from the bursitis. She tolerated the treatment well w/o any increased pain.    Personal Factors and Comorbidities Comorbidity 3+    Comorbidities history of cancer, heart disease, nephrectomy  2022, OA, history of bil knee replacements, GERD    PT Frequency 2x / week    PT Duration 6 weeks    PT Treatment/Interventions ADLs/Self Care Home Management;Cryotherapy;Electrical Stimulation;Iontophoresis 4mg /ml Dexamethasone;Moist Heat;Ultrasound;Gait training;Stair training;Functional mobility training;Therapeutic  activities;Therapeutic exercise;Balance training;Neuromuscular re-education;Manual techniques;Passive range of motion;Dry needling;Taping;Spinal Manipulations;Joint Manipulations    PT Next Visit Plan progress core/glut strengthening at tolerated, manual/modalities PRN    PT Home Exercise Plan Access Code: [email or web address]    Consulted and Agree with Plan of Care Patient             Patient will benefit from skilled therapeutic intervention in order to improve the following deficits and impairments:  Decreased activity tolerance, Decreased range of motion, Decreased strength, Pain, Decreased balance, Decreased coordination, Decreased mobility, Increased muscle spasms, Impaired flexibility  Visit Diagnosis: Pain in left hip  Stiffness of left hip, not elsewhere classified  Chronic bilateral low back pain without sciatica  Muscle weakness (generalized)     Problem List Patient Active Problem List   Diagnosis Date Noted   Viral URI 01/20/2021   Dysphagia    Hyperlipidemia LDL goal <70 12/07/2020   Atherosclerotic heart disease of native coronary artery with other forms of angina pectoris ([place])    Symptomatic PVCs 12/26/2018   Stage 3a chronic kidney disease ([place]) 12/26/2018   Degenerative lumbar disc 11/01/2016   Gastroesophageal reflux disease with esophagitis 12/10/2014   Moderate osteopenia 12/10/2014   Vitamin B12 deficiency 12/10/2014   OA (osteoarthritis) of knee 12/16/2013   Prediabetes 12/03/2013   Other screening mammogram 09/25/2012   DJD (degenerative joint disease) of knee 09/09/2011   OSA (obstructive sleep apnea) 07/29/2011   Essential hypertension, benign 07/29/2011    Artist Pais, PTA 03/08/2021, 12:14 PM  [facility] [address]  [place], [place], [address] Phone: [phone]   Fax:  [phone]  Name: CANDICE TOBEY MRN: [id number] Date of Birth: [birth year]

## 2021-03-09 ENCOUNTER — Encounter (HOSPITAL_COMMUNITY): Payer: Self-pay | Admitting: Internal Medicine

## 2021-03-09 NOTE — Progress Notes (Signed)
Attempted to obtain medical history via telephone, unable to reach at this time. I left a voicemail to return pre surgical testing department's phone call.  

## 2021-03-10 ENCOUNTER — Ambulatory Visit: Payer: Medicare Other | Admitting: Physical Therapy

## 2021-03-11 ENCOUNTER — Encounter: Payer: Self-pay | Admitting: Family Medicine

## 2021-03-11 ENCOUNTER — Ambulatory Visit (INDEPENDENT_AMBULATORY_CARE_PROVIDER_SITE_OTHER): Payer: Medicare Other | Admitting: Family Medicine

## 2021-03-11 ENCOUNTER — Other Ambulatory Visit: Payer: Self-pay

## 2021-03-11 VITALS — BP 110/60 | HR 82 | Ht 66.0 in | Wt 165.8 lb

## 2021-03-11 DIAGNOSIS — M25552 Pain in left hip: Secondary | ICD-10-CM | POA: Diagnosis not present

## 2021-03-11 DIAGNOSIS — G8929 Other chronic pain: Secondary | ICD-10-CM

## 2021-03-11 NOTE — Progress Notes (Signed)
° °  I, Wendy Poet, LAT, ATC, am serving as scribe for Dr. [name].  Kelsey Church is a 71 y.o. female who presents to [facility] today for f/u L hip pain since Nov 2022 after suffering a fall, landing on the lateral aspect of her L hip. Pt was last seen by Dr. Georgina Snell on 01/28/21 and was given a L GT steroid injection and was referred to PT, completing 5 visits. Today, pt reports that the "hematoma"/lateral hip/bursitis part has improved but notes that the buttocks pain part has not.  She states that she is abe to lay on her L side some but con't to have issues w/ this.  She states that the GT injection helped for approximately one week.  She has more PT visits scheduled and has been doing her HEP as prescribed by PT.  Dx imaging: 01/28/21 L hip XR  Pertinent review of systems: No fevers or chills  Relevant historical information: CKD.  Osteopenia.   Exam:  BP 110/60 (BP Location: Right Arm, Patient Position: Sitting, Cuff Size: Normal)    Pulse 82    Ht 5\' 6"  (1.676 m)    Wt 165 lb 12.8 oz (75.2 kg)    SpO2 97%    BMI 26.76 kg/m  General: Well Developed, well nourished, and in no acute distress.   MSK: Left hip normal. Normal motion. Tender palpation greater trochanter. Mildly tender palpation along the course of the piriformis muscle. Hip abduction and external rotation strength is diminished and painful. Leg lengths are equal.    Lab and Radiology Results EXAM: DG HIP (WITH OR WITHOUT PELVIS) 2-3V LEFT   COMPARISON:  None.   FINDINGS: SI joints are non widened. Pubic symphysis and rami appear intact. No fracture or malalignment. Joint space is patent. Probable calcified uterine fibroid in the left pelvis   IMPRESSION: No acute osseous abnormality     Electronically Signed   By: Donavan Foil M.D.   On: 01/29/2021 21:33   I, [name], personally (independently) visualized and performed the interpretation of the images attached in  this note.      Assessment and Plan: 71 y.o. female with left lateral hip and buttocks pain thought to be trochanteric bursitis and hip abductor and external rotator tendinopathy.  Greater trochanter bursa injection helped last month and physical therapy is helping but not fully resolved her pain.  Plan to really focus on home exercise program to increase strength as her strength is still significantly diminished.  If not improved in about a month to 6 weeks would recommend MRI as next step before proceeding with repeat injection potentially. She agrees with this plan.  Recheck in about a month.    Discussed warning signs or symptoms. Please see discharge instructions. Patient expresses understanding.   The above documentation has been reviewed and is accurate and complete [name], M.D.

## 2021-03-11 NOTE — Patient Instructions (Addendum)
Good to see you today.  Con't w PT and your home exercises.  Follow-up: one month

## 2021-03-15 ENCOUNTER — Ambulatory Visit: Payer: Medicare Other

## 2021-03-15 ENCOUNTER — Other Ambulatory Visit: Payer: Self-pay

## 2021-03-15 DIAGNOSIS — M25552 Pain in left hip: Secondary | ICD-10-CM

## 2021-03-15 DIAGNOSIS — M25652 Stiffness of left hip, not elsewhere classified: Secondary | ICD-10-CM | POA: Diagnosis not present

## 2021-03-15 DIAGNOSIS — G8929 Other chronic pain: Secondary | ICD-10-CM

## 2021-03-15 DIAGNOSIS — M6281 Muscle weakness (generalized): Secondary | ICD-10-CM | POA: Diagnosis not present

## 2021-03-15 DIAGNOSIS — M545 Low back pain, unspecified: Secondary | ICD-10-CM

## 2021-03-15 NOTE — Therapy (Signed)
[facility] [address] [facility] [address]  [place], [place], [address] Phone: [phone]   Fax:  [phone]  Physical Therapy Treatment  Patient Details  Name: Kelsey Church MRN: [id number] Date of Birth: 1950/07/20 Referring Provider (PT): Gregor Hams   Encounter Date: 03/15/2021   PT End of Session - 03/15/21 1153     Visit Number 6    Number of Visits 12    Date for PT Re-Evaluation 03/30/21    Authorization Type Medicare + BCBS    PT Start Time 1104    PT Stop Time 1146    PT Time Calculation (min) 42 min    Activity Tolerance Patient tolerated treatment well    Behavior During Therapy WFL for tasks assessed/performed             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/10/13   Cancer ([place])    melanoma in 2 sights   Complication of anesthesia    "chills every evening at sundown x 2 weeks"- no fever   Depression    Dysphagia    History of kidney cancer    History of shingles    NO RESIDUAL PROBLEMS   Hypertension    Pain    LOWER BACK PAIN   Sleep apnea    CPAP    Past Surgical History:  Procedure Laterality Date   APPENDECTOMY  1958   COLONOSCOPY     COLONOSCOPY W/ POLYPECTOMY     7 polyps   FOOT SURGERY Right    removal plantar wart   GALLBLADDER SURGERY  02/14/1990   INJECTION KNEE  09/2011   right    KNEE ARTHROSCOPY  1996   LEFT HEART CATH AND CORONARY ANGIOGRAPHY N/A 07/11/2019   Procedure: LEFT HEART CATH AND CORONARY ANGIOGRAPHY;  Surgeon: Belva Crome, MD;  Location: [facility];  Service: Cardiovascular;  Laterality: N/A;   MELANOMA EXCISION  1992   right chin, back   NEPHRECTOMY Left 2022   POLYPECTOMY     SALPINGOOPHORECTOMY  2009   Right   TOTAL KNEE ARTHROPLASTY Right 06/10/2013   Procedure: RIGHT TOTAL KNEE ARTHROPLASTY;  Surgeon: [name], MD;  Location: [facility];  Service: Orthopedics;   Laterality: Right;   TOTAL KNEE ARTHROPLASTY Left 12/16/2013   Procedure: LEFT TOTAL KNEE ARTHROPLASTY;  Surgeon: [name], MD;  Location: [facility];  Service: Orthopedics;  Laterality: Left;    There were no vitals filed for this visit.   Subjective Assessment - 03/15/21 1106     Subjective "I have signed up to do water aerobics, my doctor wants me to continue with PT. I got shoe inserts over the weekend and they have decreased my pain"    Pertinent History gastritis and GERD, recent weight loss, nephrectomy 2022, history cancer, history bil TKR, history cardiac disease.    Diagnostic tests diagnostic US, X-rays were unremarkable    Patient Stated Goals decrease L hip/back pain and improve back/hip strength    Currently in Pain? No/denies                               [facility] Adult PT Treatment/Exercise - 03/15/21 0001       Lumbar Exercises: Aerobic   Recumbent Bike L2x57min      Lumbar Exercises: Seated  Sit to Stand 10 reps   with red TB hip ABD     Lumbar Exercises: Supine   Bridge 15 reps;5 seconds    Bridge Limitations red band around thighs with isometric hold      Knee/Hip Exercises: Sidelying   Hip ABduction Strengthening;Left;2 sets;10 reps    Hip ABduction Limitations cues to remain on side    Other Sidelying Knee/Hip Exercises fire hydrant 10 reps      Manual Therapy   Manual Therapy Soft tissue mobilization;Myofascial release    Soft tissue mobilization STM to R glute med, piriformis    Myofascial Release to R piriformis                       PT Short Term Goals - 03/02/21 1433       PT SHORT TERM GOAL #1   Title Patient to demonstrate independence with initial HEP.    Status Achieved               PT Long Term Goals - 02/24/21 1539       PT LONG TERM GOAL #1   Title Patient to be independent with advanced HEP.    Time 6    Period Weeks    Status On-going    Target Date 03/30/21      PT LONG TERM GOAL  #2   Title Patient to demonstrate B LE strength >/=4+/5.    Time 6    Period Weeks    Status On-going    Target Date 03/30/21      PT LONG TERM GOAL #3   Title Patient will report no back/L hip pain at rest.    Baseline 5/10 at rest increases to 10/10 in morning.    Time 6    Period Weeks    Status On-going    Target Date 03/30/21      PT LONG TERM GOAL #4   Title Patient will be able to ascend/descend stairs with reciprocal step, 1 HR, and good confidence safely.    Baseline feels like her hip is not going to support her on stairs.    Time 6    Period Weeks    Status On-going    Target Date 03/30/21      PT LONG TERM GOAL #5   Title Pt. will demonstrate improved functional LE strength by completing 5x STS in <14 sec to decrease risk of falls.    Baseline 20 seconds, noted fatigue as progressed after 3 reps    Time 6    Period Weeks    Status On-going    Target Date 03/30/21                   Plan - 03/15/21 1154     Clinical Impression Statement Pt noted improvements in L hip pain since getting shoe inserts over the weekend. She still has tenderness and soft tissue retriction in her glute musculature. Cuing given in sidelying to target the glute muscles for max benefit. She also seems to have some ongoing glute weakness which could be hindering her. Pt reported feeling looser after MT.    Personal Factors and Comorbidities Comorbidity 3+    Comorbidities history of cancer, heart disease, nephrectomy 2022, OA, history of bil knee replacements, GERD    PT Frequency 2x / week    PT Duration 6 weeks    PT Treatment/Interventions ADLs/Self Care Home Management;Cryotherapy;Electrical Stimulation;Iontophoresis 4mg /ml Dexamethasone;Moist Heat;Ultrasound;Gait training;Stair training;Functional  mobility training;Therapeutic activities;Therapeutic exercise;Balance training;Neuromuscular re-education;Manual techniques;Passive range of motion;Dry needling;Taping;Spinal  Manipulations;Joint Manipulations    PT Next Visit Plan progress core/glut strengthening at tolerated, manual/modalities PRN    PT Home Exercise Plan Access Code: [email or web address]    Consulted and Agree with Plan of Care Patient             Patient will benefit from skilled therapeutic intervention in order to improve the following deficits and impairments:  Decreased activity tolerance, Decreased range of motion, Decreased strength, Pain, Decreased balance, Decreased coordination, Decreased mobility, Increased muscle spasms, Impaired flexibility  Visit Diagnosis: Pain in left hip  Stiffness of left hip, not elsewhere classified  Chronic bilateral low back pain without sciatica  Muscle weakness (generalized)     Problem List Patient Active Problem List   Diagnosis Date Noted   Viral URI 01/20/2021   Dysphagia    Hyperlipidemia LDL goal <70 12/07/2020   Atherosclerotic heart disease of native coronary artery with other forms of angina pectoris ([place])    Symptomatic PVCs 12/26/2018   Stage 3a chronic kidney disease ([place]) 12/26/2018   Degenerative lumbar disc 11/01/2016   Gastroesophageal reflux disease with esophagitis 12/10/2014   Moderate osteopenia 12/10/2014   Vitamin B12 deficiency 12/10/2014   OA (osteoarthritis) of knee 12/16/2013   Prediabetes 12/03/2013   Other screening mammogram 09/25/2012   DJD (degenerative joint disease) of knee 09/09/2011   OSA (obstructive sleep apnea) 07/29/2011   Essential hypertension, benign 07/29/2011    Artist Pais, PTA 03/15/2021, 12:00 PM  [facility] [address]  [place], [place], [address] Phone: [phone]   Fax:  [phone]  Name: SAINA WAAGE MRN: [id number] Date of Birth: Jun 04, 1950

## 2021-03-17 ENCOUNTER — Encounter (HOSPITAL_COMMUNITY)
Admission: RE | Disposition: A | Discharge status: Home / Self Care | Payer: Self-pay | Source: Home / Self Care | Attending: Internal Medicine

## 2021-03-17 ENCOUNTER — Ambulatory Visit (HOSPITAL_COMMUNITY)
Admission: RE | Admit: 2021-03-17 | Discharge: 2021-03-17 | Disposition: A | Discharge status: Home / Self Care | Payer: Medicare Other | Attending: Internal Medicine | Admitting: Internal Medicine

## 2021-03-17 DIAGNOSIS — R1319 Other dysphagia: Secondary | ICD-10-CM

## 2021-03-17 DIAGNOSIS — R1310 Dysphagia, unspecified: Secondary | ICD-10-CM | POA: Diagnosis not present

## 2021-03-17 HISTORY — PX: ESOPHAGEAL MANOMETRY: SHX5429

## 2021-03-17 SURGERY — MANOMETRY, ESOPHAGUS

## 2021-03-17 MED ORDER — LIDOCAINE VISCOUS HCL 2 % MT SOLN
OROMUCOSAL | Status: AC
  Filled 2021-03-17: qty 15

## 2021-03-17 SURGICAL SUPPLY — 2 items
FACESHIELD LNG OPTICON STERILE (SAFETY) IMPLANT
GLOVE BIO SURGEON STRL SZ8 (GLOVE) ×4 IMPLANT

## 2021-03-17 NOTE — Progress Notes (Signed)
Esophageal Manometry done per protocol. Patient tolerated well without distress or complication.  

## 2021-03-18 ENCOUNTER — Ambulatory Visit: Payer: Medicare Other | Attending: Family Medicine

## 2021-03-18 ENCOUNTER — Encounter (HOSPITAL_COMMUNITY): Payer: Self-pay | Admitting: Internal Medicine

## 2021-03-18 ENCOUNTER — Other Ambulatory Visit: Payer: Self-pay

## 2021-03-18 DIAGNOSIS — G8929 Other chronic pain: Secondary | ICD-10-CM | POA: Insufficient documentation

## 2021-03-18 DIAGNOSIS — M545 Low back pain, unspecified: Secondary | ICD-10-CM | POA: Insufficient documentation

## 2021-03-18 DIAGNOSIS — M6281 Muscle weakness (generalized): Secondary | ICD-10-CM | POA: Diagnosis not present

## 2021-03-18 DIAGNOSIS — M25652 Stiffness of left hip, not elsewhere classified: Secondary | ICD-10-CM | POA: Insufficient documentation

## 2021-03-18 DIAGNOSIS — M25552 Pain in left hip: Secondary | ICD-10-CM | POA: Insufficient documentation

## 2021-03-18 NOTE — Therapy (Signed)
[facility] [address]  [place], [place], [address] Phone: [phone]   Fax:  [phone]  Physical Therapy Treatment  Patient Details  Name: Kelsey Church MRN: [id number] Date of Birth: [birth year] Referring Provider (PT): Gregor Hams   Encounter Date: 03/18/2021   PT End of Session - 03/18/21 1404     Visit Number 7    Number of Visits 12    Date for PT Re-Evaluation 03/30/21    Authorization Type Medicare + BCBS    PT Start Time 1316    PT Stop Time 1359    PT Time Calculation (min) 43 min    Activity Tolerance Patient tolerated treatment well    Behavior During Therapy WFL for tasks assessed/performed             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/10/13   Cancer ([place])    melanoma in 2 sights   Complication of anesthesia    "chills every evening at sundown x 2 weeks"- no fever   Depression    Dysphagia    History of kidney cancer    History of shingles    NO RESIDUAL PROBLEMS   Hypertension    Pain    LOWER BACK PAIN   Sleep apnea    CPAP    Past Surgical History:  Procedure Laterality Date   APPENDECTOMY  1958   COLONOSCOPY     COLONOSCOPY W/ POLYPECTOMY     7 polyps   ESOPHAGEAL MANOMETRY N/A 03/17/2021   Procedure: ESOPHAGEAL MANOMETRY (EM);  Surgeon: Jerene Bears, MD;  Location: [facility];  Service: Gastroenterology;  Laterality: N/A;   FOOT SURGERY Right    removal plantar wart   GALLBLADDER SURGERY  02/14/1990   INJECTION KNEE  09/2011   right    KNEE ARTHROSCOPY  1996   LEFT HEART CATH AND CORONARY ANGIOGRAPHY N/A 07/11/2019   Procedure: LEFT HEART CATH AND CORONARY ANGIOGRAPHY;  Surgeon: Belva Crome, MD;  Location: [facility];  Service: Cardiovascular;  Laterality: N/A;   MELANOMA EXCISION  1992   right chin, back   NEPHRECTOMY Left 2022   POLYPECTOMY     SALPINGOOPHORECTOMY   2009   Right   TOTAL KNEE ARTHROPLASTY Right 06/10/2013   Procedure: RIGHT TOTAL KNEE ARTHROPLASTY;  Surgeon: [name], MD;  Location: [facility];  Service: Orthopedics;  Laterality: Right;   TOTAL KNEE ARTHROPLASTY Left 12/16/2013   Procedure: LEFT TOTAL KNEE ARTHROPLASTY;  Surgeon: [name], MD;  Location: [facility];  Service: Orthopedics;  Laterality: Left;    There were no vitals filed for this visit.   Subjective Assessment - 03/18/21 1320     Subjective Whatever we did last time worked really well, I realized 2 days after our last session I didn't feel any pain.    Pertinent History gastritis and GERD, recent weight loss, nephrectomy 2022, history cancer, history bil TKR, history cardiac disease.    Diagnostic tests diagnostic US, X-rays were unremarkable    Patient Stated Goals decrease L hip/back pain and improve back/hip strength    Currently in Pain? No/denies                [facility] PT Assessment - 03/18/21 0001       Strength   Left Hip Flexion 4+/5    Left Hip  Extension 4/5    Left Hip ABduction 4/5    Left Hip ADduction 4/5                           [facility] Adult PT Treatment/Exercise - 03/18/21 0001       Lumbar Exercises: Stretches   Figure 4 Stretch 2 reps;30 seconds    Figure 4 Stretch Limitations seated      Lumbar Exercises: Aerobic   Recumbent Bike L2x65min      Lumbar Exercises: Supine   Bridge 15 reps;5 seconds    Bridge Limitations isometric hip ABD with GTB      Knee/Hip Exercises: Standing   Lateral Step Up Both;10 reps;Hand Hold: 0;Step Height: 4"    Forward Step Up Hand Hold: 0;Step Height: 4";20 reps;Both    Other Standing Knee Exercises hip hike R/L 20 reps      Knee/Hip Exercises: Sidelying   Hip ABduction Strengthening;Both;10 reps                       PT Short Term Goals - 03/02/21 1433       PT SHORT TERM GOAL #1   Title Patient to demonstrate independence with initial HEP.    Status  Achieved               PT Long Term Goals - 03/18/21 1339       PT LONG TERM GOAL #1   Title Patient to be independent with advanced HEP.    Time 6    Period Weeks    Status On-going    Target Date 03/30/21      PT LONG TERM GOAL #2   Title Patient to demonstrate B LE strength >/=4+/5.    Time 6    Period Weeks    Status On-going   03/18/21- see flow sheet   Target Date 03/30/21      PT LONG TERM GOAL #3   Title Patient will report no back/L hip pain at rest.    Baseline 5/10 at rest increases to 10/10 in morning.    Time 6    Period Weeks    Status On-going    Target Date 03/30/21      PT LONG TERM GOAL #4   Title Patient will be able to ascend/descend stairs with reciprocal step, 1 HR, and good confidence safely.    Baseline feels like her hip is not going to support her on stairs.    Time 6    Period Weeks    Status On-going    Target Date 03/30/21      PT LONG TERM GOAL #5   Title Pt. will demonstrate improved functional LE strength by completing 5x STS in <14 sec to decrease risk of falls.    Baseline 20 seconds, noted fatigue as progressed after 3 reps    Time 6    Period Weeks    Status On-going    Target Date 03/30/21                   Plan - 03/18/21 1405     Clinical Impression Statement Pt still shows glute and ADD weakness in the L hip. Continued progression to target remaining weakness in the L hip. Pt showed a good demonstration of exercises with cuing. Only noting some muscle fatigue post session.    Personal Factors and Comorbidities Comorbidity 3+    Comorbidities history  of cancer, heart disease, nephrectomy 2022, OA, history of bil knee replacements, GERD    PT Frequency 2x / week    PT Duration 6 weeks    PT Treatment/Interventions ADLs/Self Care Home Management;Cryotherapy;Electrical Stimulation;Iontophoresis 4mg /ml Dexamethasone;Moist Heat;Ultrasound;Gait training;Stair training;Functional mobility training;Therapeutic  activities;Therapeutic exercise;Balance training;Neuromuscular re-education;Manual techniques;Passive range of motion;Dry needling;Taping;Spinal Manipulations;Joint Manipulations    PT Next Visit Plan progress core/glut strengthening at tolerated, manual/modalities PRN    PT Home Exercise Plan Access Code: [email or web address]    Consulted and Agree with Plan of Care Patient             Patient will benefit from skilled therapeutic intervention in order to improve the following deficits and impairments:  Decreased activity tolerance, Decreased range of motion, Decreased strength, Pain, Decreased balance, Decreased coordination, Decreased mobility, Increased muscle spasms, Impaired flexibility  Visit Diagnosis: Pain in left hip  Stiffness of left hip, not elsewhere classified  Chronic bilateral low back pain without sciatica  Muscle weakness (generalized)     Problem List Patient Active Problem List   Diagnosis Date Noted   Viral URI 01/20/2021   Dysphagia    Hyperlipidemia LDL goal <70 12/07/2020   Atherosclerotic heart disease of native coronary artery with other forms of angina pectoris ([place])    Symptomatic PVCs 12/26/2018   Stage 3a chronic kidney disease ([place]) 12/26/2018   Degenerative lumbar disc 11/01/2016   Gastroesophageal reflux disease with esophagitis 12/10/2014   Moderate osteopenia 12/10/2014   Vitamin B12 deficiency 12/10/2014   OA (osteoarthritis) of knee 12/16/2013   Prediabetes 12/03/2013   Other screening mammogram 09/25/2012   DJD (degenerative joint disease) of knee 09/09/2011   OSA (obstructive sleep apnea) 07/29/2011   Essential hypertension, benign 07/29/2011    Artist Pais, PTA 03/18/2021, 2:14 PM  [facility] [address]  [place], [place], [address] Phone: [phone]   Fax:  [phone]  Name: Kelsey Church MRN: [id number] Date of Birth: 1951/01/10

## 2021-03-22 ENCOUNTER — Other Ambulatory Visit: Payer: Self-pay

## 2021-03-22 ENCOUNTER — Ambulatory Visit: Payer: Medicare Other

## 2021-03-22 DIAGNOSIS — M25652 Stiffness of left hip, not elsewhere classified: Secondary | ICD-10-CM

## 2021-03-22 DIAGNOSIS — M25552 Pain in left hip: Secondary | ICD-10-CM | POA: Diagnosis not present

## 2021-03-22 DIAGNOSIS — M6281 Muscle weakness (generalized): Secondary | ICD-10-CM | POA: Diagnosis not present

## 2021-03-22 DIAGNOSIS — G8929 Other chronic pain: Secondary | ICD-10-CM | POA: Diagnosis not present

## 2021-03-22 DIAGNOSIS — M545 Low back pain, unspecified: Secondary | ICD-10-CM

## 2021-03-22 NOTE — Therapy (Signed)
[facility] [address]  [place], [place], [address] Phone: [phone]   Fax:  [phone]  Physical Therapy Treatment  Patient Details  Name: Kelsey SERRAO MRN: [id number] Date of Birth: Mar 11, 1950 Referring Provider (PT): Gregor Hams   Encounter Date: 03/22/2021   PT End of Session - 03/22/21 1140     Visit Number 8    Number of Visits 12    Date for PT Re-Evaluation 03/30/21    Authorization Type Medicare + BCBS    PT Start Time 1056    PT Stop Time 1140    PT Time Calculation (min) 44 min    Activity Tolerance Patient tolerated treatment well    Behavior During Therapy WFL for tasks assessed/performed             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/10/13   Cancer ([place])    melanoma in 2 sights   Complication of anesthesia    "chills every evening at sundown x 2 weeks"- no fever   Depression    Dysphagia    History of kidney cancer    History of shingles    NO RESIDUAL PROBLEMS   Hypertension    Pain    LOWER BACK PAIN   Sleep apnea    CPAP    Past Surgical History:  Procedure Laterality Date   APPENDECTOMY  1958   COLONOSCOPY     COLONOSCOPY W/ POLYPECTOMY     7 polyps   ESOPHAGEAL MANOMETRY N/A 03/17/2021   Procedure: ESOPHAGEAL MANOMETRY (EM);  Surgeon: Jerene Bears, MD;  Location: [facility];  Service: Gastroenterology;  Laterality: N/A;   FOOT SURGERY Right    removal plantar wart   GALLBLADDER SURGERY  02/14/1990   INJECTION KNEE  09/2011   right    KNEE ARTHROSCOPY  1996   LEFT HEART CATH AND CORONARY ANGIOGRAPHY N/A 07/11/2019   Procedure: LEFT HEART CATH AND CORONARY ANGIOGRAPHY;  Surgeon: Belva Crome, MD;  Location: [facility];  Service: Cardiovascular;  Laterality: N/A;   MELANOMA EXCISION  1992   right chin, back   NEPHRECTOMY Left 2022   POLYPECTOMY     SALPINGOOPHORECTOMY   2009   Right   TOTAL KNEE ARTHROPLASTY Right 06/10/2013   Procedure: RIGHT TOTAL KNEE ARTHROPLASTY;  Surgeon: [name], MD;  Location: [facility];  Service: Orthopedics;  Laterality: Right;   TOTAL KNEE ARTHROPLASTY Left 12/16/2013   Procedure: LEFT TOTAL KNEE ARTHROPLASTY;  Surgeon: [name], MD;  Location: [facility];  Service: Orthopedics;  Laterality: Left;    There were no vitals filed for this visit.   Subjective Assessment - 03/22/21 1058     Subjective "I sat a bit too much over the yesterday, but my hip still isn't giving me problems right now."    Pertinent History gastritis and GERD, recent weight loss, nephrectomy 2022, history cancer, history bil TKR, history cardiac disease.    Diagnostic tests diagnostic US, X-rays were unremarkable    Patient Stated Goals decrease L hip/back pain and improve back/hip strength    Currently in Pain? No/denies                               [facility] Adult PT Treatment/Exercise - 03/22/21 0001  Ambulation/Gait   Stairs Yes    Stairs Assistance 6: Modified independent (Device/Increase time)    Stair Management Technique One rail Right;Alternating pattern    Number of Stairs 12    Height of Stairs 8    Gait Comments pt reports not trusting herself on steps so avoids them, objectively hesitant with steps      Lumbar Exercises: Stretches   Figure 4 Stretch 2 reps;30 seconds    Figure 4 Stretch Limitations seated      Lumbar Exercises: Aerobic   Nustep L5x102min      Knee/Hip Exercises: Machines for Strengthening   Cybex Knee Flexion 20lb 10 reps BLE, 10lb 10 reps RLE      Knee/Hip Exercises: Standing   Hip Abduction Stengthening;Both;10 reps;Knee straight    Abduction Limitations 2HA    Lateral Step Up Both;Hand Hold: 0;Step Height: 4";15 reps    Forward Step Up Hand Hold: 0;Step Height: 4";20 reps;Both    Functional Squat 10 reps;2 sets   sumo squats, 2HA, cues for posterior WS     Knee/Hip Exercises:  Seated   Ball Squeeze 3x10"    Other Seated Knee/Hip Exercises B IR with ball squeeze 10 reps                       PT Short Term Goals - 03/02/21 1433       PT SHORT TERM GOAL #1   Title Patient to demonstrate independence with initial HEP.    Status Achieved               PT Long Term Goals - 03/18/21 1339       PT LONG TERM GOAL #1   Title Patient to be independent with advanced HEP.    Time 6    Period Weeks    Status On-going    Target Date 03/30/21      PT LONG TERM GOAL #2   Title Patient to demonstrate B LE strength >/=4+/5.    Time 6    Period Weeks    Status On-going   03/18/21- see flow sheet   Target Date 03/30/21      PT LONG TERM GOAL #3   Title Patient will report no back/L hip pain at rest.    Baseline 5/10 at rest increases to 10/10 in morning.    Time 6    Period Weeks    Status On-going    Target Date 03/30/21      PT LONG TERM GOAL #4   Title Patient will be able to ascend/descend stairs with reciprocal step, 1 HR, and good confidence safely.    Baseline feels like her hip is not going to support her on stairs.    Time 6    Period Weeks    Status On-going    Target Date 03/30/21      PT LONG TERM GOAL #5   Title Pt. will demonstrate improved functional LE strength by completing 5x STS in <14 sec to decrease risk of falls.    Baseline 20 seconds, noted fatigue as progressed after 3 reps    Time 6    Period Weeks    Status On-going    Target Date 03/30/21                   Plan - 03/22/21 1147     Clinical Impression Statement We are continuing to progress weight bearing TE to challenge the patients lumbopelvic control.  She noted some irritation to the LB with standing hip ext but after seated figure 4 stretch the irritation subsided. Now that we are able to progress WB exercises w/o pain, standard height steps are a remaining challenge along with continued medio-lateral hip weakness.    Personal Factors and  Comorbidities Comorbidity 3+    Comorbidities history of cancer, heart disease, nephrectomy 2022, OA, history of bil knee replacements, GERD    PT Frequency 2x / week    PT Duration 6 weeks    PT Treatment/Interventions ADLs/Self Care Home Management;Cryotherapy;Electrical Stimulation;Iontophoresis 4mg /ml Dexamethasone;Moist Heat;Ultrasound;Gait training;Stair training;Functional mobility training;Therapeutic activities;Therapeutic exercise;Balance training;Neuromuscular re-education;Manual techniques;Passive range of motion;Dry needling;Taping;Spinal Manipulations;Joint Manipulations    PT Next Visit Plan progress core/glut strengthening at tolerated, progress stair training    PT Home Exercise Plan Access Code: [email or web address]    Consulted and Agree with Plan of Care Patient             Patient will benefit from skilled therapeutic intervention in order to improve the following deficits and impairments:  Decreased activity tolerance, Decreased range of motion, Decreased strength, Pain, Decreased balance, Decreased coordination, Decreased mobility, Increased muscle spasms, Impaired flexibility  Visit Diagnosis: Pain in left hip  Stiffness of left hip, not elsewhere classified  Chronic bilateral low back pain without sciatica  Muscle weakness (generalized)     Problem List Patient Active Problem List   Diagnosis Date Noted   Viral URI 01/20/2021   Dysphagia    Hyperlipidemia LDL goal <70 12/07/2020   Atherosclerotic heart disease of native coronary artery with other forms of angina pectoris ([place])    Symptomatic PVCs 12/26/2018   Stage 3a chronic kidney disease ([place]) 12/26/2018   Degenerative lumbar disc 11/01/2016   Gastroesophageal reflux disease with esophagitis 12/10/2014   Moderate osteopenia 12/10/2014   Vitamin B12 deficiency 12/10/2014   OA (osteoarthritis) of knee 12/16/2013   Prediabetes 12/03/2013   Other screening mammogram 09/25/2012   DJD (degenerative joint  disease) of knee 09/09/2011   OSA (obstructive sleep apnea) 07/29/2011   Essential hypertension, benign 07/29/2011    Artist Pais, PTA 03/22/2021, 11:59 AM  [facility] [address]  [place], [place], [address] Phone: [phone]   Fax:  [phone]  Name: SHAYLEEN EPPINGER MRN: [id number] Date of Birth: [birth year]

## 2021-03-24 ENCOUNTER — Encounter: Payer: Self-pay | Admitting: Physical Therapy

## 2021-03-24 ENCOUNTER — Ambulatory Visit: Payer: Medicare Other | Admitting: Physical Therapy

## 2021-03-24 ENCOUNTER — Other Ambulatory Visit: Payer: Self-pay

## 2021-03-24 DIAGNOSIS — M6281 Muscle weakness (generalized): Secondary | ICD-10-CM | POA: Diagnosis not present

## 2021-03-24 DIAGNOSIS — M25552 Pain in left hip: Secondary | ICD-10-CM | POA: Diagnosis not present

## 2021-03-24 DIAGNOSIS — M545 Low back pain, unspecified: Secondary | ICD-10-CM | POA: Diagnosis not present

## 2021-03-24 DIAGNOSIS — G8929 Other chronic pain: Secondary | ICD-10-CM | POA: Diagnosis not present

## 2021-03-24 DIAGNOSIS — M25652 Stiffness of left hip, not elsewhere classified: Secondary | ICD-10-CM

## 2021-03-24 NOTE — Therapy (Signed)
[facility] [address]  [place], [place], [address] Phone: [phone]   Fax:  [phone]  Physical Therapy Treatment  Patient Details  Name: Kelsey Church MRN: [id number] Date of Birth: 1950/04/16 Referring Provider (PT): Gregor Hams   Encounter Date: 03/24/2021   PT End of Session - 03/24/21 1413     Visit Number 9    Number of Visits 12    Date for PT Re-Evaluation 03/30/21    Authorization Type Medicare + BCBS    PT Start Time 1407    PT Stop Time 1450    PT Time Calculation (min) 43 min    Activity Tolerance Patient tolerated treatment well    Behavior During Therapy WFL for tasks assessed/performed             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/10/13   Cancer ([place])    melanoma in 2 sights   Complication of anesthesia    "chills every evening at sundown x 2 weeks"- no fever   Depression    Dysphagia    History of kidney cancer    History of shingles    NO RESIDUAL PROBLEMS   Hypertension    Pain    LOWER BACK PAIN   Sleep apnea    CPAP    Past Surgical History:  Procedure Laterality Date   APPENDECTOMY  1958   COLONOSCOPY     COLONOSCOPY W/ POLYPECTOMY     7 polyps   ESOPHAGEAL MANOMETRY N/A 03/17/2021   Procedure: ESOPHAGEAL MANOMETRY (EM);  Surgeon: Jerene Bears, MD;  Location: [facility];  Service: Gastroenterology;  Laterality: N/A;   FOOT SURGERY Right    removal plantar wart   GALLBLADDER SURGERY  02/14/1990   INJECTION KNEE  09/2011   right    KNEE ARTHROSCOPY  1996   LEFT HEART CATH AND CORONARY ANGIOGRAPHY N/A 07/11/2019   Procedure: LEFT HEART CATH AND CORONARY ANGIOGRAPHY;  Surgeon: Belva Crome, MD;  Location: [facility];  Service: Cardiovascular;  Laterality: N/A;   MELANOMA EXCISION  1992   right chin, back   NEPHRECTOMY Left 2022   POLYPECTOMY     SALPINGOOPHORECTOMY   2009   Right   TOTAL KNEE ARTHROPLASTY Right 06/10/2013   Procedure: RIGHT TOTAL KNEE ARTHROPLASTY;  Surgeon: [name], MD;  Location: [facility];  Service: Orthopedics;  Laterality: Right;   TOTAL KNEE ARTHROPLASTY Left 12/16/2013   Procedure: LEFT TOTAL KNEE ARTHROPLASTY;  Surgeon: [name], MD;  Location: [facility];  Service: Orthopedics;  Laterality: Left;    There were no vitals filed for this visit.   Subjective Assessment - 03/24/21 1411     Subjective Pt. reports the L hip pain is much better after dry needling, manual and also getting new inserts.  Back feels stronger as well.  "Didn't sleep well last night so not feeling the best."    Pertinent History gastritis and GERD, recent weight loss, nephrectomy 2022, history cancer, history bil TKR, history cardiac disease.    Diagnostic tests diagnostic US, X-rays were unremarkable    Patient Stated Goals decrease L hip/back pain and improve back/hip strength    Currently in Pain? No/denies                [facility] PT Assessment - 03/24/21 0001  Strength   Right Hip Flexion 4+/5    Right Hip Extension 3+/5    Right Hip ABduction 5/5    Right Hip ADduction 5/5    Left Hip Flexion 4+/5    Left Hip Extension 3+/5    Left Hip ABduction 5/5    Left Hip ADduction 5/5                           [facility] Adult PT Treatment/Exercise - 03/24/21 0001       Ambulation/Gait   Stairs Yes    Stairs Assistance 6: Modified independent (Device/Increase time)    Stair Management Technique One rail Right;Alternating pattern    Number of Stairs 12    Height of Stairs 8    Gait Comments reports feeling much more confident      Lumbar Exercises: Aerobic   Nustep L5x 16mn      Knee/Hip Exercises: Standing   Lateral Step Up Both;2 sets;10 reps;Hand Hold: 2;Step Height: 8"    Lateral Step Up Limitations full size step in stairway    Forward Step Up Both;2 sets;10 reps;Hand Hold: 1;Step Height: 8"    Forward Step  Up Limitations full size step in stairway, cues for eccentric control    Step Down Both;10 reps;Hand Hold: 1;Step Height: 2"    Step Down Limitations single leg squat/dips on step for eccentric strengthening      Knee/Hip Exercises: Seated   Sit to Sand 2 sets;5 reps;without UE support      Manual Therapy   Manual Therapy Myofascial release;Other (comment)    Manual therapy comments to decrease muscle spasm and pain L glutes    Myofascial Release to L glutes/ piriformis    Other Manual Therapy skilled palpation and monitoring with dry needling              Trigger Point Dry Needling - 03/24/21 0001     Consent Given? Yes    Education Handout Provided Previously provided    Muscles Treated Back/Hip Piriformis;Gluteus medius    Dry Needling Comments left    Gluteus Medius Response Twitch response elicited;Palpable increased muscle length    Piriformis Response Twitch response elicited;Palpable increased muscle length                     PT Short Term Goals - 03/02/21 1433       PT SHORT TERM GOAL #1   Title Patient to demonstrate independence with initial HEP.    Status Achieved               PT Long Term Goals - 03/24/21 1428       PT LONG TERM GOAL #1   Title Patient to be independent with advanced HEP.    Time 6    Period Weeks    Status On-going    Target Date 03/30/21      PT LONG TERM GOAL #2   Title Patient to demonstrate B LE strength >/=4+/5.    Time 6    Period Weeks    Status Partially Met   03/24/21- improved 4+5 hip flexion/abduction/adduction.  Noted weakness in extensors (not tested IE) 3+/5   Target Date 03/30/21      PT LONG TERM GOAL #3   Title Patient will report no back/L hip pain at rest.    Baseline 5/10 at rest increases to 10/10 in morning.    Time 6  Period Weeks    Status On-going   03/24/20- no pain   Target Date 03/30/21      PT LONG TERM GOAL #4   Title Patient will be able to ascend/descend stairs with  reciprocal step, 1 HR, and good confidence safely.    Baseline feels like her hip is not going to support her on stairs.    Time 6    Period Weeks    Status Achieved   03/24/21- met   Target Date 03/30/21      PT LONG TERM GOAL #5   Title Pt. will demonstrate improved functional LE strength by completing 5x STS in <14 sec to decrease risk of falls.    Baseline 20 seconds, noted fatigue as progressed after 3 reps    Time 6    Period Weeks    Status On-going   03/24/21- 18 seconds   Target Date 03/30/21                   Plan - 03/24/21 1509     Clinical Impression Statement Pt. is making good progress, reporting feeling much stronger and no pain in back or hip.  She felt much more comfortable on stairs today, but did report some achiness/fatigue in L hip with repeated step ups.  Discussed plan to return to aquatic therapy next week and discussed working on stairs in pull where she would have more support to continue strengthening.  She also plans on starting to use stairs at church rather than elevator.  Still noted some trigger points in L glutes/piriformis today, tolerated dry needling well with report of decreased tightness following.    Personal Factors and Comorbidities Comorbidity 3+    Comorbidities history of cancer, heart disease, nephrectomy 2022, OA, history of bil knee replacements, GERD    PT Frequency 2x / week    PT Duration 6 weeks    PT Treatment/Interventions ADLs/Self Care Home Management;Cryotherapy;Electrical Stimulation;Iontophoresis 4mg /ml Dexamethasone;Moist Heat;Ultrasound;Gait training;Stair training;Functional mobility training;Therapeutic activities;Therapeutic exercise;Balance training;Neuromuscular re-education;Manual techniques;Passive range of motion;Dry needling;Taping;Spinal Manipulations;Joint Manipulations    PT Next Visit Plan 30 day hold or discharge    PT Home Exercise Plan Access Code: [email or web address]    Consulted and Agree with Plan of Care Patient              Patient will benefit from skilled therapeutic intervention in order to improve the following deficits and impairments:  Decreased activity tolerance, Decreased range of motion, Decreased strength, Pain, Decreased balance, Decreased coordination, Decreased mobility, Increased muscle spasms, Impaired flexibility  Visit Diagnosis: Pain in left hip  Stiffness of left hip, not elsewhere classified  Chronic bilateral low back pain without sciatica  Muscle weakness (generalized)     Problem List Patient Active Problem List   Diagnosis Date Noted   Viral URI 01/20/2021   Dysphagia    Hyperlipidemia LDL goal <70 12/07/2020   Atherosclerotic heart disease of native coronary artery with other forms of angina pectoris ([place])    Symptomatic PVCs 12/26/2018   Stage 3a chronic kidney disease ([place]) 12/26/2018   Degenerative lumbar disc 11/01/2016   Gastroesophageal reflux disease with esophagitis 12/10/2014   Moderate osteopenia 12/10/2014   Vitamin B12 deficiency 12/10/2014   OA (osteoarthritis) of knee 12/16/2013   Prediabetes 12/03/2013   Other screening mammogram 09/25/2012   DJD (degenerative joint disease) of knee 09/09/2011   OSA (obstructive sleep apnea) 07/29/2011   Essential hypertension, benign 07/29/2011    Rennie Natter, PT, DPT  03/24/2021,  3:12 PM  [facility] [address]  [place], [place], [address] Phone: [phone]   Fax:  [phone]  Name: Kelsey Church MRN: [id number] Date of Birth: 09-03-1950

## 2021-03-29 ENCOUNTER — Ambulatory Visit: Payer: Medicare Other | Admitting: Physical Therapy

## 2021-03-29 ENCOUNTER — Other Ambulatory Visit: Payer: Self-pay

## 2021-03-29 ENCOUNTER — Encounter: Payer: Self-pay | Admitting: Physical Therapy

## 2021-03-29 DIAGNOSIS — G8929 Other chronic pain: Secondary | ICD-10-CM | POA: Diagnosis not present

## 2021-03-29 DIAGNOSIS — M25552 Pain in left hip: Secondary | ICD-10-CM

## 2021-03-29 DIAGNOSIS — M545 Low back pain, unspecified: Secondary | ICD-10-CM

## 2021-03-29 DIAGNOSIS — M25652 Stiffness of left hip, not elsewhere classified: Secondary | ICD-10-CM | POA: Diagnosis not present

## 2021-03-29 DIAGNOSIS — M6281 Muscle weakness (generalized): Secondary | ICD-10-CM | POA: Diagnosis not present

## 2021-03-29 NOTE — Patient Instructions (Signed)
Access Code: [email or web address] URL: [email or web address] Date: 03/29/2021 Prepared by: Glenetta Hew  Exercises Supine Lower Trunk Rotation - 1 x daily - 7 x weekly - 2 sets - 10 reps Supine Posterior Pelvic Tilt - 1 x daily - 7 x weekly - 2 sets - 10 reps Sit to Stand - 1 x daily - 7 x weekly - 2 sets - 10 reps Bridge with Hip Abduction and Resistance - 1 x daily - 7 x weekly - 3 sets - 10 reps Clamshell with Resistance - 1 x daily - 7 x weekly - 3 sets - 10 reps Seated Hip Flexor Stretch (Mirrored) - 2 x daily - 7 x weekly - 1 sets - 3 reps - 30-60 sec hold Supine Hip Internal and External Rotation - 2 x daily - 7 x weekly - 2 sets - 10 reps - 3 sec hold Braided Sidestepping - 1 x daily - 7 x weekly - 3 sets - 10 reps Push-Up on Counter - 1 x daily - 7 x weekly - 3 sets - 10 reps

## 2021-03-29 NOTE — Therapy (Signed)
[facility] [address]  [place], [place], [address] Phone: [phone]   Fax:  [phone]  Physical Therapy Treatment Progress Note Reporting Period 02/16/2021 to 03/29/2021  See note below for Objective Data and Assessment of Progress/Goals.  FOTO improved to 73% above predicted outcome.     Patient Details  Name: Kelsey Church MRN: [id number] Date of Birth: 12/15/1950 Referring Provider (PT): Gregor Hams   Encounter Date: 03/29/2021   PT End of Session - 03/29/21 0939     Visit Number 10    Number of Visits 12    Date for PT Re-Evaluation 03/30/21    Authorization Type Medicare + BCBS    PT Start Time 0930    PT Stop Time 1018    PT Time Calculation (min) 48 min    Activity Tolerance Patient tolerated treatment well    Behavior During Therapy WFL for tasks assessed/performed             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/10/13   Cancer ([place])    melanoma in 2 sights   Complication of anesthesia    "chills every evening at sundown x 2 weeks"- no fever   Depression    Dysphagia    History of kidney cancer    History of shingles    NO RESIDUAL PROBLEMS   Hypertension    Pain    LOWER BACK PAIN   Sleep apnea    CPAP    Past Surgical History:  Procedure Laterality Date   APPENDECTOMY  1958   COLONOSCOPY     COLONOSCOPY W/ POLYPECTOMY     7 polyps   ESOPHAGEAL MANOMETRY N/A 03/17/2021   Procedure: ESOPHAGEAL MANOMETRY (EM);  Surgeon: Jerene Bears, MD;  [facility];  Service: Gastroenterology;  Laterality: N/A;   FOOT SURGERY Right    removal plantar wart   GALLBLADDER SURGERY  02/14/1990   INJECTION KNEE  09/2011   right    KNEE ARTHROSCOPY  1996   LEFT HEART CATH AND CORONARY ANGIOGRAPHY N/A 07/11/2019   Procedure: LEFT HEART CATH AND CORONARY ANGIOGRAPHY;  Surgeon: Belva Crome, MD;   Location: [facility];  Service: Cardiovascular;  Laterality: N/A;   MELANOMA EXCISION  1992   right chin, back   NEPHRECTOMY Left 2022   POLYPECTOMY     SALPINGOOPHORECTOMY  2009   Right   TOTAL KNEE ARTHROPLASTY Right 06/10/2013   Procedure: RIGHT TOTAL KNEE ARTHROPLASTY;  Surgeon: [name], MD;  Location: [facility];  Service: Orthopedics;  Laterality: Right;   TOTAL KNEE ARTHROPLASTY Left 12/16/2013   Procedure: LEFT TOTAL KNEE ARTHROPLASTY;  Surgeon: [name], MD;  Location: [facility];  Service: Orthopedics;  Laterality: Left;    There were no vitals filed for this visit.   Subjective Assessment - 03/29/21 1141     Subjective Pt. reports she was sore after dry needling this time, but due to weather over weekend not able to get out and exercise and work it out.  She did do stairs at church on Thursday.  She is planning on going back to water aerobics this week.    Pertinent History gastritis and GERD, recent weight loss, nephrectomy 2022, history cancer, history bil TKR, history cardiac disease.    Diagnostic tests diagnostic US, X-rays were unremarkable    Patient  Stated Goals decrease L hip/back pain and improve back/hip strength    Currently in Pain? No/denies                [facility] PT Assessment - 03/29/21 0001       Assessment   Medical Diagnosis M25.552,G89.29 (ICD-10-CM) - Chronic left hip pain    Referring Provider (PT) Gregor Hams.    Onset Date/Surgical Date 01/02/21    Prior Therapy yes for balance      Observation/Other Assessments   Focus on Therapeutic Outcomes (FOTO)  hip:73%, predicted outcome 71%      Strength   Right Hip Flexion 4+/5    Right Hip Extension 3+/5    Right Hip ABduction 5/5    Right Hip ADduction 5/5    Left Hip Flexion 4+/5    Left Hip Extension 3+/5    Left Hip ABduction 5/5    Left Hip ADduction 5/5      Ambulation/Gait   Stairs Yes    Stairs Assistance 6: Modified independent (Device/Increase time)    Stair  Management Technique One rail Right;Alternating pattern    Number of Stairs 48    Height of Stairs 8    Gait Comments no pain, no hesitation      Balance   Balance Assessed Yes      Static Standing Balance   Static Standing - Balance Support No upper extremity supported    Static Standing - Level of Assistance 7: Independent    Static Standing Balance -  Activities  Single Leg Stance - Right Leg;Single Leg Stance - Left Leg    Static Standing - Comment/# of Minutes x 1 min bil, no instability or LOB      High Level Balance   High Level Balance Activites Braiding    High Level Balance Comments tandem walking 2 x 10', braiding 2 x 10', no instability                           [facility] Adult PT Treatment/Exercise - 03/29/21 0001       Therapeutic Activites    Therapeutic Activities Other Therapeutic Activities    Other Therapeutic Activities --   floor to stand transfers using chair, review of HEP and progression, improving confidence on stairs, balance and hip mobility exercises     Lumbar Exercises: Stretches   Hip Flexor Stretch Right;Left;3 reps;20 seconds    Hip Flexor Stretch Limitations seated IR of hip.      Lumbar Exercises: Aerobic   Nustep L5x 17mn      Lumbar Exercises: Standing   Other Standing Lumbar Exercises tapping feet backwards and fowards                     PT Education - 03/29/21 1141     Education Details HEP update Access Code: [email or web address]   Person(s) Educated Patient    Methods Explanation;Demonstration    Comprehension Verbalized understanding;Returned demonstration              PT Short Term Goals - 03/02/21 1433       PT SHORT TERM GOAL #1   Title Patient to demonstrate independence with initial HEP.    Status Achieved               PT Long Term Goals - 03/29/21 0954       PT LONG TERM GOAL #1   Title Patient to be  independent with advanced HEP.    Time 6    Period Weeks    Status Achieved     Target Date 03/30/21      PT LONG TERM GOAL #2   Title Patient to demonstrate B LE strength >/=4+/5.    Time 6    Period Weeks    Status Partially Met   03/24/21- improved 4+5 hip flexion/abduction/adduction.  Noted weakness in extensors (not tested IE) 3+/5   Target Date 03/30/21      PT LONG TERM GOAL #3   Title Patient will report no back/L hip pain at rest.    Baseline 5/10 at rest increases to 10/10 in morning.    Time 6    Period Weeks    Status Achieved   03/29/21- just soreness now   Target Date 03/30/21      PT LONG TERM GOAL #4   Title Patient will be able to ascend/descend stairs with reciprocal step, 1 HR, and good confidence safely.    Baseline feels like her hip is not going to support her on stairs.    Time 6    Period Weeks    Status Achieved   03/24/21- met   Target Date 03/30/21      PT LONG TERM GOAL #5   Title Pt. will demonstrate improved functional LE strength by completing 5x STS in <14 sec to decrease risk of falls.    Baseline 20 seconds, noted fatigue as progressed after 3 reps    Time 6    Period Weeks    Status Partially Met   03/24/21- 18 seconds  03/29/20- 17 seconds   Target Date 03/30/21                   Plan - 03/29/21 1143     Clinical Impression Statement Patient has made good progress and has met LTG # 1,3, and 4 and made good progress with goals #2 and 5.  Her hip pain has improved significant, she is able to ascend/descend stairs safely with reciprocal step, and she demonstrates good balance, able to maintain SLS x 1 min bil and tandem walk 10 steps.  Today focused on improving self-efficacy with transition back to water aerobics to continue strengthening.  Also worked on floor to standing transfers using chair.  Due to progress, she is being placed on 30 day hold today, with discharge if she does not need to return within this time period.    Personal Factors and Comorbidities Comorbidity 3+    Comorbidities history of cancer, heart  disease, nephrectomy 2022, OA, history of bil knee replacements, GERD    PT Frequency 2x / week    PT Duration 6 weeks    PT Treatment/Interventions ADLs/Self Care Home Management;Cryotherapy;Electrical Stimulation;Iontophoresis 62m/ml Dexamethasone;Moist Heat;Ultrasound;Gait training;Stair training;Functional mobility training;Therapeutic activities;Therapeutic exercise;Balance training;Neuromuscular re-education;Manual techniques;Passive range of motion;Dry needling;Taping;Spinal Manipulations;Joint Manipulations    PT Next Visit Plan 30 day hold or discharge    PT Home Exercise Plan Access Code: [email or web address]   Consulted and Agree with Plan of Care Patient             Patient will benefit from skilled therapeutic intervention in order to improve the following deficits and impairments:  Decreased activity tolerance, Decreased range of motion, Decreased strength, Pain, Decreased balance, Decreased coordination, Decreased mobility, Increased muscle spasms, Impaired flexibility  Visit Diagnosis: Stiffness of left hip, not elsewhere classified  Pain in left hip  Chronic bilateral low back pain without  sciatica  Muscle weakness (generalized)     Problem List Patient Active Problem List   Diagnosis Date Noted   Viral URI 01/20/2021   Dysphagia    Hyperlipidemia LDL goal <70 12/07/2020   Atherosclerotic heart disease of native coronary artery with other forms of angina pectoris ([facility])    Symptomatic PVCs 12/26/2018   Stage 3a chronic kidney disease ([place]) 12/26/2018   Degenerative lumbar disc 11/01/2016   Gastroesophageal reflux disease with esophagitis 12/10/2014   Moderate osteopenia 12/10/2014   Vitamin B12 deficiency 12/10/2014   OA (osteoarthritis) of knee 12/16/2013   Prediabetes 12/03/2013   Other screening mammogram 09/25/2012   DJD (degenerative joint disease) of knee 09/09/2011   OSA (obstructive sleep apnea) 07/29/2011   Essential hypertension, benign 07/29/2011     Rennie Natter, PT, DPT  03/29/2021, 11:47 AM  [facility] [address]  [place], [place], [address] Phone: [phone]   Fax:  [phone]  Name: Kelsey Church MRN: [id number] Date of Birth: Feb 26, 1950

## 2021-04-13 ENCOUNTER — Ambulatory Visit: Payer: Medicare Other | Admitting: Cardiology

## 2021-04-21 NOTE — Progress Notes (Signed)
? ?  I, Kelsey Church, LAT, ATC, am serving as scribe for Dr. [name]. ? ?Kelsey Church is a 71 y.o. female who presents to [place] [facility] today for f/u L hip pain thought to be trochanteric bursitis and hip abductor and external rotator tendinopathy that's been ongoing since Nov 2022 after suffering a fall, landing on the lateral aspect of her L hip. Pt was last seen by Dr. Georgina Snell on 03/11/21 and was advised to really focus on HEP. Pt cont PT, completing 10 total visits, the last of which was on 03/29/21. Today, pt reports that her L hip pain is much better.  She states that she feels PT really helped.  She reports having been to [employer] last week and did well during the day but con't to have pain at night when laying on her L side. ? ?She had a left greater trochanteric injection about 3 months ago. ? ?Dx imaging: 01/28/21 L hip XR ? ?Pertinent review of systems: No fevers or chills ? ?Relevant historical information: Hypertension.  CKD. ? ? ?Exam:  ?BP 104/62 (BP Location: Right Arm, Patient Position: Sitting, Cuff Size: Normal)   Pulse (!) 42   Ht '5\' 6"'$  (1.676 m)   Wt 166 lb 6.4 oz (75.5 kg)   SpO2 96%   BMI 26.86 kg/m?  ?General: Well Developed, well nourished, and in no acute distress.  ? ?MSK: Left hip normal. ?Tender palpation at greater trochanter.   ?Pain with resisted hip abduction strength testing. ? ? ? ?Lab and Radiology Results ?Hip greater trochanteric injection: left ?Consent obtained and timeout performed. ?Area of maximum tenderness palpated and identified. ?Skin cleaned with alcohol, cold spray applied. ?A spinal needle was used to access the greater trochanteric bursa. ?'40mg'$  of Kenalog and 2 mL of Marcaine were used to inject the trochanteric bursa. ?Patient tolerated the procedure well. ? ? ? ? ? ?Assessment and Plan: ?71 y.o. female with left lateral hip pain due to greater trochanteric bursitis.  She has had significant improvement with physical therapy but  still has some residual pain that is interfering with sleep.  Plan for repeat injection today and continued home exercise program.  Recheck back as needed. ? ? ? ?Discussed warning signs or symptoms. Please see discharge instructions. Patient expresses understanding. ? ? ?The above documentation has been reviewed and is accurate and complete [name], M.D. ? ? ?

## 2021-04-22 ENCOUNTER — Encounter: Payer: Self-pay | Admitting: Family Medicine

## 2021-04-22 ENCOUNTER — Other Ambulatory Visit: Payer: Self-pay

## 2021-04-22 ENCOUNTER — Ambulatory Visit (INDEPENDENT_AMBULATORY_CARE_PROVIDER_SITE_OTHER): Payer: Medicare Other | Admitting: Family Medicine

## 2021-04-22 VITALS — BP 104/62 | HR 42 | Ht 66.0 in | Wt 166.4 lb

## 2021-04-22 DIAGNOSIS — M25552 Pain in left hip: Secondary | ICD-10-CM

## 2021-04-22 DIAGNOSIS — G8929 Other chronic pain: Secondary | ICD-10-CM

## 2021-04-22 DIAGNOSIS — M7062 Trochanteric bursitis, left hip: Secondary | ICD-10-CM

## 2021-04-22 DIAGNOSIS — N184 Chronic kidney disease, stage 4 (severe): Secondary | ICD-10-CM | POA: Diagnosis not present

## 2021-04-22 NOTE — Patient Instructions (Signed)
Good to see you today. ? ?Keep working on your home exercises. ? ?You've had a hip injection.  Call or go to the ER if you develop a large red swollen joint with extreme pain or oozing puss.  ? ?Follow-up as needed. ?

## 2021-04-22 NOTE — Progress Notes (Signed)
?  ?Cardiology Office Note ? ? ?Date:  04/23/2021  ? ?ID:  Kelsey Church, DOB 06-17-1950, MRN [id number] ? ?PCP:  Ann Held, DO  ?Cardiologist:   Minus Breeding, MD ?Referring:  Carollee Herter, Alferd Apa, DO ? ?Chief Complaint  ?Patient presents with  ? Coronary Artery Disease  ? ? ?  ?History of Present Illness: ?Kelsey Church is a 71 y.o. female who was referred by Carollee Herter, Alferd Apa, DO for evaluation of CAD.   She had an abnormal POET (Plain Old Exercise Treadmill) and had cath with results below.  She is managed medically for CAD.   She had predominantly small vessel or distal vessel disease.  She had nonobstructive disease in proximal large vessel and mid large vessels.   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  She subsequently developed renal insufficiency with a creatinine peaking at 4.7.  It was thought that this could have been her Crestor which reduced to 5 mg although she and her daughter-in-law think it was related to [facility] that was being used.  Her creatinine did subsequently come down to 3.3.  She has not had any further cardiac work-up but there were no apparent cardiac complications during her nephrectomy. ? ?She otherwise says she is not having any chest discomfort, neck or arm discomfort.  She does have some bursitis.  However, she is exercising routinely.  She denies any cardiovascular symptoms. The patient denies any new symptoms such as chest discomfort, neck or arm discomfort. There has been no new shortness of breath, PND or orthopnea. There have been no reported palpitations, presyncope or syncope.  Of note she reported that her nephrologist did want her to come off of the statin.  I do not see in the last note from nephrology from December that this was an absolute but the patient has not been taking the Crestor as was her last plan.  She does have Repatha and had previously been approved for this but just did  not start it. ? ? ?Past Medical History:  ?Diagnosis Date  ? Arthritis   ? PAIN AND OA LEFT KNEE; S/P RIGHT TOTAL KNEE ARTHROPLASTY 06/10/13  ? Cancer [facility])   ? melanoma in 2 sights  ? Complication of anesthesia   ? "chills every evening at sundown x 2 weeks"- no fever  ? Depression   ? Dysphagia   ? History of kidney cancer   ? History of shingles   ? NO RESIDUAL PROBLEMS  ? Hypertension   ? Pain   ? LOWER BACK PAIN  ? Sleep apnea   ? CPAP  ? ? ?Past Surgical History:  ?Procedure Laterality Date  ? APPENDECTOMY  1958  ? COLONOSCOPY    ? COLONOSCOPY W/ POLYPECTOMY    ? 7 polyps  ? ESOPHAGEAL MANOMETRY N/A 03/17/2021  ? Procedure: ESOPHAGEAL MANOMETRY (EM);  Surgeon: Jerene Bears, MD;  Location: [facility];  Service: Gastroenterology;  Laterality: N/A;  ? FOOT SURGERY Right   ? removal plantar wart  ? GALLBLADDER SURGERY  02/14/1990  ? INJECTION KNEE  09/2011  ? right   ? KNEE ARTHROSCOPY  1996  ? LEFT HEART CATH AND CORONARY ANGIOGRAPHY N/A 07/11/2019  ? Procedure: LEFT HEART CATH AND CORONARY ANGIOGRAPHY;  Surgeon: Belva Crome, MD;  Location: [place] [facility];  Service: Cardiovascular;  Laterality: N/A;  ? [place]  ? right chin, back  ?  NEPHRECTOMY Left 2022  ? POLYPECTOMY    ? SALPINGOOPHORECTOMY  2009  ? Right  ? TOTAL KNEE ARTHROPLASTY Right 06/10/2013  ? Procedure: RIGHT TOTAL KNEE ARTHROPLASTY;  Surgeon: [name], MD;  Location: [facility];  Service: Orthopedics;  Laterality: Right;  ? TOTAL KNEE ARTHROPLASTY Left 12/16/2013  ? Procedure: LEFT TOTAL KNEE ARTHROPLASTY;  Surgeon: [name], MD;  Location: [facility];  Service: Orthopedics;  Laterality: Left;  ? ? ? ?Current Outpatient Medications  ?Medication Sig Dispense Refill  ? acetaminophen (TYLENOL) 650 MG CR tablet Take 1,300 mg by mouth every 8 (eight) hours as needed for pain.     ? aspirin EC 81 MG tablet Take 1 tablet (81 mg total) by mouth daily. 90 tablet 3  ? cetirizine (ZYRTEC) 10 MG tablet Take 10 mg by mouth daily as  needed for allergies.    ? citalopram (CELEXA) 20 MG tablet TAKE 1 TABLET AT BEDTIME 90 tablet 1  ? Evolocumab (REPATHA SURECLICK) 782 MG/ML SOAJ Inject into the skin every 14 (fourteen) days.    ? metoprolol succinate (TOPROL-XL) 25 MG 24 hr tablet Take 2 tablets (50 mg total) by mouth in the morning AND 1 tablet (25 mg total) every evening. 270 tablet 3  ? nitroGLYCERIN (NITROSTAT) 0.4 MG SL tablet Place 1 tablet (0.4 mg total) under the tongue every 5 (five) minutes as needed for chest pain. 25 tablet 3  ? pantoprazole (PROTONIX) 40 MG tablet Take 1 tablet (40 mg total) by mouth 2 (two) times daily before a meal. 60 tablet 3  ? ranolazine (RANEXA) 500 MG 12 hr tablet Take 1 tablet (500 mg total) by mouth 2 (two) times daily. 180 tablet 3  ? ?No current facility-administered medications for this visit.  ? ? ?Allergies:   Atorvastatin and Rosuvastatin  ? ? ?ROS:  Please see the history of present illness.   Otherwise, review of systems are positive for hallucinations.   All other systems are reviewed and negative.  ? ? ?PHYSICAL EXAM: ?VS:  BP 110/61   Pulse 65   Ht '5\' 6"'$  (1.676 m)   Wt 168 lb 3.2 oz (76.3 kg)   SpO2 99%   BMI 27.15 kg/m?  , BMI Body mass index is 27.15 kg/m?. ?GENERAL:  Well appearing ?NECK:  No jugular venous distention, waveform within normal limits, carotid upstroke brisk and symmetric, no bruits, no thyromegaly ?LUNGS:  Clear to auscultation bilaterally ?CHEST:  Unremarkable ?HEART:  PMI not displaced or sustained,S1 and S2 within normal limits, no S3, no S4, no clicks, no rubs, no murmurs ?ABD:  Flat, positive bowel sounds normal in frequency in pitch, no bruits, no rebound, no guarding, no midline pulsatile mass, no hepatomegaly, no splenomegaly ?EXT:  2 plus pulses throughout, no edema, no cyanosis no clubbing ? ? ?EKG:  EKG is  ordered today. ?Sinus rhythm, rate 65, axis within normal limits, intervals within normal limits, premature atrial contractions, no acute ST-T wave  changes. ? ? ?Recent Labs: ?08/05/2020: TSH 1.54 ?03/02/2021: ALT 13; BUN 30; Creatinine, Ser 2.06; Hemoglobin 12.6; Platelets 277.0; Potassium 4.0; Sodium 141  ? ? ?Lipid Panel ?   ?Component Value Date/Time  ? CHOL 232 (H) 03/02/2021 1628  ? TRIG 147.0 03/02/2021 1628  ? HDL 53.70 03/02/2021 1628  ? CHOLHDL 4 03/02/2021 1628  ? VLDL 29.4 03/02/2021 1628  ? [place] 149 (H) 03/02/2021 1628  ? ? Diagnostic ?Dominance: Right ? ? ? ?Wt Readings from Last 3 Encounters:  ?04/23/21  168 lb 3.2 oz (76.3 kg)  ?04/22/21 166 lb 6.4 oz (75.5 kg)  ?03/11/21 165 lb 12.8 oz (75.2 kg)  ?  ? ? ?Other studies Reviewed: ?Additional studies/ records that were reviewed today include: Labs ?Review of the above records demonstrates:  Please see elsewhere in the note.   ? ? ?ASSESSMENT AND PLAN: ? ? ?CKD IIIa:   She is having this followed closely by nephrology and her last creatinine was 2.06 that I have although there might be more recent 1.  ? ?DYSLIPIDEMIA:     LDL was 149 in January and she is going to start the [place].  She should get up follow-up lipid in 3 months.  ? ?CAD:   She has no symptoms.  We will continue with medical management.  ? ?DIFFICULTY SWALLOWING:    She saw GI.   ? ?HALLUCINATIONS: She does have hallucinations and confusion that can happen with Ranexa although I do not see hallucinations.  It also could happen with her Celexa.  I have asked that she discuss this with her primary provider.  If it gets worse we could trial her off of the Ranexa.   ? ?Current medicines are reviewed at length with the patient today.  The patient does not have concerns regarding medicines. ? ?The following changes have been made: As above ? ?Labs/ tests ordered today include:   ? ?Orders Placed This Encounter  ?Procedures  ? Lipid panel  ? EKG 12-Lead  ? ? ? ? ? ?Disposition:   FU with me in 12 months  ? ?Signed, ?Minus Breeding, MD  ?04/23/2021 1:53 PM    ?[place] ? ? ?

## 2021-04-23 ENCOUNTER — Ambulatory Visit (INDEPENDENT_AMBULATORY_CARE_PROVIDER_SITE_OTHER): Payer: Medicare Other | Admitting: Cardiology

## 2021-04-23 ENCOUNTER — Encounter: Payer: Self-pay | Admitting: Cardiology

## 2021-04-23 VITALS — BP 110/61 | HR 65 | Ht 66.0 in | Wt 168.2 lb

## 2021-04-23 DIAGNOSIS — Z5181 Encounter for therapeutic drug level monitoring: Secondary | ICD-10-CM

## 2021-04-23 DIAGNOSIS — E785 Hyperlipidemia, unspecified: Secondary | ICD-10-CM | POA: Diagnosis not present

## 2021-04-23 DIAGNOSIS — N1831 Chronic kidney disease, stage 3a: Secondary | ICD-10-CM

## 2021-04-23 DIAGNOSIS — I2510 Atherosclerotic heart disease of native coronary artery without angina pectoris: Secondary | ICD-10-CM | POA: Diagnosis not present

## 2021-04-23 MED ORDER — RANOLAZINE ER 500 MG PO TB12: 500.0000 mg | ORAL_TABLET | Freq: Two times a day (BID) | ORAL | 3 refills | Status: DC

## 2021-04-23 NOTE — Patient Instructions (Addendum)
Medication Instructions:  ?START REPATHA EVERY 14 DAYS AS DISCUSSED  ? ?*If you need a refill on your cardiac medications before your next appointment, please call your pharmacy* ? ?Lab Work: ?FASTING LP IN 3 MONTHS  ? ?Testing/Procedures: ?NONE  ? ?Follow-Up: ?At [facility], you and your health needs are our priority.  As part of our continuing mission to provide you with exceptional heart care, we have created designated Provider Care Teams.  These Care Teams include your primary Cardiologist (physician) and Advanced Practice Providers (APPs -  Physician Assistants and Nurse Practitioners) who all work together to provide you with the care you need, when you need it. ? ?We recommend signing up for the patient portal called "MyChart".  Sign up information is provided on this After Visit Summary.  MyChart is used to connect with patients for Virtual Visits (Telemedicine).  Patients are able to view lab/test results, encounter notes, upcoming appointments, etc.  Non-urgent messages can be sent to your provider as well.   ?To learn more about what you can do with MyChart, go to [email or web address].   ? ?Your next appointment:   ?12 month(s) ? ?The format for your next appointment:   ?In Person ? ?Provider:   ?Minus Breeding, MD { ? ?

## 2021-04-26 DIAGNOSIS — Z85528 Personal history of other malignant neoplasm of kidney: Secondary | ICD-10-CM | POA: Diagnosis not present

## 2021-04-27 ENCOUNTER — Telehealth: Payer: Self-pay | Admitting: Pulmonary Disease

## 2021-04-27 ENCOUNTER — Other Ambulatory Visit: Payer: Self-pay | Admitting: Urology

## 2021-04-27 DIAGNOSIS — I2510 Atherosclerotic heart disease of native coronary artery without angina pectoris: Secondary | ICD-10-CM | POA: Diagnosis not present

## 2021-04-27 DIAGNOSIS — C642 Malignant neoplasm of left kidney, except renal pelvis: Secondary | ICD-10-CM | POA: Diagnosis not present

## 2021-04-27 DIAGNOSIS — Z85528 Personal history of other malignant neoplasm of kidney: Secondary | ICD-10-CM

## 2021-04-27 DIAGNOSIS — R1310 Dysphagia, unspecified: Secondary | ICD-10-CM | POA: Diagnosis not present

## 2021-04-27 DIAGNOSIS — Z905 Acquired absence of kidney: Secondary | ICD-10-CM | POA: Diagnosis not present

## 2021-04-27 DIAGNOSIS — D631 Anemia in chronic kidney disease: Secondary | ICD-10-CM | POA: Diagnosis not present

## 2021-04-27 DIAGNOSIS — N184 Chronic kidney disease, stage 4 (severe): Secondary | ICD-10-CM | POA: Diagnosis not present

## 2021-04-27 DIAGNOSIS — R42 Dizziness and giddiness: Secondary | ICD-10-CM | POA: Diagnosis not present

## 2021-04-27 DIAGNOSIS — R441 Visual hallucinations: Secondary | ICD-10-CM | POA: Diagnosis not present

## 2021-04-27 NOTE — Telephone Encounter (Signed)
ATC patient to see if her machine has an SD card in it and if it does if she can bring it with her to her appointment tomorrow. Unable to leave message will try again later ?

## 2021-04-28 ENCOUNTER — Ambulatory Visit: Payer: Medicare Other | Admitting: Pulmonary Disease

## 2021-05-04 ENCOUNTER — Ambulatory Visit (INDEPENDENT_AMBULATORY_CARE_PROVIDER_SITE_OTHER): Payer: Medicare Other | Admitting: Family Medicine

## 2021-05-04 ENCOUNTER — Encounter: Payer: Self-pay | Admitting: Family Medicine

## 2021-05-04 VITALS — BP 124/66 | HR 98 | Temp 98.0°F | Ht 66.0 in | Wt 163.3 lb

## 2021-05-04 DIAGNOSIS — I2510 Atherosclerotic heart disease of native coronary artery without angina pectoris: Secondary | ICD-10-CM | POA: Diagnosis not present

## 2021-05-04 DIAGNOSIS — R443 Hallucinations, unspecified: Secondary | ICD-10-CM

## 2021-05-04 NOTE — Progress Notes (Signed)
? ?Subjective:  ? ?By signing my name below, I, [name], attest that this documentation has been prepared under the direction and in the presence of Dr. Roma Schanz, DO. 05/04/2021 ? ? ? Patient ID: Kelsey Church, female    DOB: Jun 16, 1950, 71 y.o.   MRN: [id number] ? ?Chief Complaint  ?Patient presents with  ? night terrors   ?  Saw her mother walking past her bed, saw dog on bed and saw a collum down her hall way along with a animated lobster near her bed. This happens every night since February. Saw different things on different nights. Usually happens after having some deep sleep she wakes up seeing different things   ? ? ?HPI ?Patient is in today for a office visit.  ? ?She complains of hallucinations for the past 2 months. She has difficulty sleeping. Her daughter-in-law reports having a fall in late January, 2023 and since then her episodes of hallucinations started. She finds after falling asleep she feels slightly awake and hallucinate. She reports seeing visions of her mother walking past her bed or an animal resembling her pet dog. She has had multiple episodes of this at night. She uses a CPAP machine regularly. She switched her CPAP machine 2 weeks ago. She changed her CPAP to another machine yesterday and found last night she felt water go into her nose and she had an illusion of a tree. She notes while on her original CPAP machine she did not have these episodes. She notes having it since 2014 but had to change it due to the machine getting old. She also notes while on her new CPAP machine she finds she wakes up with water drenching her. She notes after waking up from these hallucinations and nightmares she feels a salty taste in her mouth but finds she cannot swallow water. She also notes having difficulties swallowing food in general. She try's cutting her foot into small bite size pieces but still continues having difficulties swallowing. ?She continues taking 20 mg Celexa daily PO  every night. She is interested in increasing her dosage to 40 mg.   ? ? ?Past Medical History:  ?Diagnosis Date  ? Arthritis   ? PAIN AND OA LEFT KNEE; S/P RIGHT TOTAL KNEE ARTHROPLASTY 06/10/13  ? Cancer [facility])   ? melanoma in 2 sights  ? Complication of anesthesia   ? "chills every evening at sundown x 2 weeks"- no fever  ? Depression   ? Dysphagia   ? History of kidney cancer   ? History of shingles   ? NO RESIDUAL PROBLEMS  ? Hypertension   ? Pain   ? LOWER BACK PAIN  ? Sleep apnea   ? CPAP  ? ? ?Past Surgical History:  ?Procedure Laterality Date  ? APPENDECTOMY  1958  ? COLONOSCOPY    ? COLONOSCOPY W/ POLYPECTOMY    ? 7 polyps  ? ESOPHAGEAL MANOMETRY N/A 03/17/2021  ? Procedure: ESOPHAGEAL MANOMETRY (EM);  Surgeon: Jerene Bears, MD;  Location: [facility];  Service: Gastroenterology;  Laterality: N/A;  ? FOOT SURGERY Right   ? removal plantar wart  ? GALLBLADDER SURGERY  02/14/1990  ? INJECTION KNEE  09/2011  ? right   ? KNEE ARTHROSCOPY  1996  ? LEFT HEART CATH AND CORONARY ANGIOGRAPHY N/A 07/11/2019  ? Procedure: LEFT HEART CATH AND CORONARY ANGIOGRAPHY;  Surgeon: Belva Crome, MD;  Location: [place] [facility];  Service: Cardiovascular;  Laterality: N/A;  ? MELANOMA EXCISION  1992  ? right chin, back  ? NEPHRECTOMY Left 2022  ? POLYPECTOMY    ? SALPINGOOPHORECTOMY  2009  ? Right  ? TOTAL KNEE ARTHROPLASTY Right 06/10/2013  ? Procedure: RIGHT TOTAL KNEE ARTHROPLASTY;  Surgeon: [name], MD;  Location: [facility];  Service: Orthopedics;  Laterality: Right;  ? TOTAL KNEE ARTHROPLASTY Left 12/16/2013  ? Procedure: LEFT TOTAL KNEE ARTHROPLASTY;  Surgeon: [name], MD;  Location: [facility];  Service: Orthopedics;  Laterality: Left;  ? ? ?Family History  ?Problem Relation Age of Onset  ? Heart disease Father   ? Heart attack Father 43  ?     39  ? Hypertension Father   ? Osteoporosis Mother   ? Breast cancer Mother   ? Cancer Maternal Grandmother   ?     Breast and Uterine Cancer  ? Breast cancer Maternal  Grandmother   ? Thyroid cancer Maternal Grandmother   ? Hypertension Sister   ? Diabetes Sister   ? Colon polyps Sister   ? Colon cancer Neg Hx   ? Rectal cancer Neg Hx   ? Stomach cancer Neg Hx   ? Esophageal cancer Neg Hx   ? Allergic rhinitis Neg Hx   ? Asthma Neg Hx   ? Eczema Neg Hx   ? Urticaria Neg Hx   ? ? ?Social History  ? ?Socioeconomic History  ? Marital status: Widowed  ?  Spouse name: Not on file  ? Number of children: 1  ? Years of education: Not on file  ? Highest education level: Not on file  ?Occupational History  ? Occupation: CMA  ?  Employer: [employer]  ?Tobacco Use  ? Smoking status: Never  ? Smokeless tobacco: Never  ?Vaping Use  ? Vaping Use: Never used  ?Substance and Sexual Activity  ? Alcohol use: No  ?  Alcohol/week: 0.0 standard drinks  ? Drug use: No  ? Sexual activity: Not Currently  ?  Comment: 1st intercourse- 18, partners- 1,   ?Other Topics Concern  ? Not on file  ?Social History Narrative  ? Lives alone.  Widow.  One son.   ? Caffienated drinks-no  ? Seat belt use often-yes  ? Regular Exercise-yes  ? Smoke alarm in the home-yes  ? Firearms/guns in the home-no  ? History of physical abuse-no  ?   ?   ?   ? ?Social Determinants of Health  ? ?Financial Resource Strain: Not on file  ?Food Insecurity: Not on file  ?Transportation Needs: Not on file  ?Physical Activity: Not on file  ?Stress: Not on file  ?Social Connections: Not on file  ?Intimate Partner Violence: Not on file  ? ? ?Outpatient Medications Prior to Visit  ?Medication Sig Dispense Refill  ? acetaminophen (TYLENOL) 650 MG CR tablet Take 1,300 mg by mouth every 8 (eight) hours as needed for pain.     ? aspirin EC 81 MG tablet Take 1 tablet (81 mg total) by mouth daily. 90 tablet 3  ? cetirizine (ZYRTEC) 10 MG tablet Take 10 mg by mouth daily as needed for allergies.    ? citalopram (CELEXA) 20 MG tablet TAKE 1 TABLET AT BEDTIME 90 tablet 1  ? Evolocumab (REPATHA SURECLICK) 182 MG/ML SOAJ Inject into the skin every 14  (fourteen) days.    ? metoprolol succinate (TOPROL-XL) 25 MG 24 hr tablet Take 2 tablets (50 mg total) by mouth in the morning AND 1 tablet (25 mg total) every evening.  270 tablet 3  ? nitroGLYCERIN (NITROSTAT) 0.4 MG SL tablet Place 1 tablet (0.4 mg total) under the tongue every 5 (five) minutes as needed for chest pain. 25 tablet 3  ? pantoprazole (PROTONIX) 40 MG tablet Take 1 tablet (40 mg total) by mouth 2 (two) times daily before a meal. 60 tablet 3  ? ranolazine (RANEXA) 500 MG 12 hr tablet Take 1 tablet (500 mg total) by mouth 2 (two) times daily. 180 tablet 3  ? ?No facility-administered medications prior to visit.  ? ? ?Allergies  ?Allergen Reactions  ? Atorvastatin   ? Rosuvastatin   ?  '40mg'$ --Creatinine elevation  ? ? ?Review of Systems  ?Gastrointestinal:   ?     (+)difficulty swallowing food and water  ?Psychiatric/Behavioral:    ?     (+)hallucinations ?(+)nightmares  ? ?   ?Objective:  ?  ?Physical Exam ?Constitutional:   ?   General: She is not in acute distress. ?   Appearance: Normal appearance. She is not ill-appearing.  ?HENT:  ?   Head: Normocephalic and atraumatic.  ?   Right Ear: External ear normal.  ?   Left Ear: External ear normal.  ?Eyes:  ?   Extraocular Movements: Extraocular movements intact.  ?   Pupils: Pupils are equal, round, and reactive to light.  ?Cardiovascular:  ?   Rate and Rhythm: Normal rate and regular rhythm.  ?   Heart sounds: Normal heart sounds. No murmur heard. ?  No gallop.  ?Pulmonary:  ?   Effort: Pulmonary effort is normal. No respiratory distress.  ?   Breath sounds: Normal breath sounds. No wheezing or rales.  ?Skin: ?   General: Skin is warm and dry.  ?Neurological:  ?   Mental Status: She is alert and oriented to person, place, and time.  ?Psychiatric:     ?   Judgment: Judgment normal.  ? ? ?BP 124/66   Pulse 98   Temp 98 ?F (36.7 ?C) (Oral)   Ht '5\' 6"'$  (1.676 m)   Wt 163 lb 4.8 oz (74.1 kg)   SpO2 96%   BMI 26.36 kg/m?  ?Wt Readings from Last 3  Encounters:  ?05/04/21 163 lb 4.8 oz (74.1 kg)  ?04/23/21 168 lb 3.2 oz (76.3 kg)  ?04/22/21 166 lb 6.4 oz (75.5 kg)  ? ? ?Diabetic Foot Exam - Simple   ?No data filed ?  ? ?Lab Results  ?Component Value Date  ? WBC 7.1

## 2021-05-04 NOTE — Patient Instructions (Signed)
Sleep Apnea ?Sleep apnea is a condition in which breathing pauses or becomes shallow during sleep. People with sleep apnea usually snore loudly. They may have times when they gasp and stop breathing for 10 seconds or more during sleep. This may happen many times during the night. ?Sleep apnea disrupts your sleep and keeps your body from getting the rest that it needs. This condition can increase your risk of certain health problems, including: ?Heart attack. ?Stroke. ?Obesity. ?Type 2 diabetes. ?Heart failure. ?Irregular heartbeat. ?High blood pressure. ?The goal of treatment is to help you breathe normally again. ?What are the causes? ?The most common cause of sleep apnea is a collapsed or blocked airway. ?There are three kinds of sleep apnea: ?Obstructive sleep apnea. This kind is caused by a blocked or collapsed airway. ?Central sleep apnea. This kind happens when the part of the brain that controls breathing does not send the correct signals to the muscles that control breathing. ?Mixed sleep apnea. This is a combination of obstructive and central sleep apnea. ?What increases the risk? ?You are more likely to develop this condition if you: ?Are overweight. ?Smoke. ?Have a smaller than normal airway. ?Are older. ?Are female. ?Drink alcohol. ?Take sedatives or tranquilizers. ?Have a family history of sleep apnea. ?Have a tongue or tonsils that are larger than normal. ?What are the signs or symptoms? ?Symptoms of this condition include: ?Trouble staying asleep. ?Loud snoring. ?Morning headaches. ?Waking up gasping. ?Dry mouth or sore throat in the morning. ?Daytime sleepiness and tiredness. ?If you have daytime fatigue because of sleep apnea, you may be more likely to have: ?Trouble concentrating. ?Forgetfulness. ?Irritability or mood swings. ?Personality changes. ?Feelings of depression. ?Sexual dysfunction. This may include loss of interest if you are female, or erectile dysfunction if you are female. ?How is this  diagnosed? ?This condition may be diagnosed with: ?A medical history. ?A physical exam. ?A series of tests that are done while you are sleeping (sleep study). These tests are usually done in a sleep lab, but they may also be done at home. ?How is this treated? ?Treatment for this condition aims to restore normal breathing and to ease symptoms during sleep. It may involve managing health issues that can affect breathing, such as high blood pressure or obesity. Treatment may include: ?Sleeping on your side. ?Using a decongestant if you have nasal congestion. ?Avoiding the use of depressants, including alcohol, sedatives, and narcotics. ?Losing weight if you are overweight. ?Making changes to your diet. ?Quitting smoking. ?Using a device to open your airway while you sleep, such as: ?An oral appliance. This is a custom-made mouthpiece that shifts your lower jaw forward. ?A continuous positive airway pressure (CPAP) device. This device blows air through a mask when you breathe out (exhale). ?A nasal expiratory positive airway pressure (EPAP) device. This device has valves that you put into each nostril. ?A bi-level positive airway pressure (BIPAP) device. This device blows air through a mask when you breathe in (inhale) and breathe out (exhale). ?Having surgery if other treatments do not work. During surgery, excess tissue is removed to create a wider airway. ?Follow these instructions at home: ?Lifestyle ?Make any lifestyle changes that your health care provider recommends. ?Eat a healthy, well-balanced diet. ?Take steps to lose weight if you are overweight. ?Avoid using depressants, including alcohol, sedatives, and narcotics. ?Do not use any products that contain nicotine or tobacco. These products include cigarettes, chewing tobacco, and vaping devices, such as e-cigarettes. If you need help quitting, ask your  health care provider. ?General instructions ?Take over-the-counter and prescription medicines only as told  by your health care provider. ?If you were given a device to open your airway while you sleep, use it only as told by your health care provider. ?If you are having surgery, make sure to tell your health care provider you have sleep apnea. You may need to bring your device with you. ?Keep all follow-up visits. This is important. ?Contact a health care provider if: ?The device that you received to open your airway during sleep is uncomfortable or does not seem to be working. ?Your symptoms do not improve. ?Your symptoms get worse. ?Get help right away if: ?You develop: ?Chest pain. ?Shortness of breath. ?Discomfort in your back, arms, or stomach. ?You have: ?Trouble speaking. ?Weakness on one side of your body. ?Drooping in your face. ?These symptoms may represent a serious problem that is an emergency. Do not wait to see if the symptoms will go away. Get medical help right away. Call your local emergency services (911 in the [place] Do not drive yourself to the hospital. ?Summary ?Sleep apnea is a condition in which breathing pauses or becomes shallow during sleep. ?The most common cause is a collapsed or blocked airway. ?The goal of treatment is to restore normal breathing and to ease symptoms during sleep. ?This information is not intended to replace advice given to you by your health care provider. Make sure you discuss any questions you have with your health care provider. ?Document Revised: 09/09/2020 Document Reviewed: 01/10/2020 ?Elsevier Patient Education ? [place]. ? ?

## 2021-05-06 DIAGNOSIS — R443 Hallucinations, unspecified: Secondary | ICD-10-CM | POA: Insufficient documentation

## 2021-05-06 NOTE — Assessment & Plan Note (Signed)
?   From head injury or sleep apnea ?Pt will f/u with pulmonary  ?Hold off on klonopin until pt sees pulmonary ?MRi brain  ?

## 2021-05-11 ENCOUNTER — Other Ambulatory Visit: Payer: Self-pay

## 2021-05-11 ENCOUNTER — Encounter: Payer: Self-pay | Admitting: Pulmonary Disease

## 2021-05-11 MED ORDER — METOPROLOL SUCCINATE ER 25 MG PO TB24: ORAL_TABLET | ORAL | 3 refills | Status: DC

## 2021-05-12 NOTE — Telephone Encounter (Signed)
Mychart message sent by pt: ? ?Kelsey Church "[name]"  P [facility] (supporting Rigoberto Noel, MD) 16 hours ago (5:36 PM)  ? ?Dr. Elsworth Soho ?I have appointment with you in middle of April however the new CPAP Machine I have received is not working for me. [facility] has been assisting me past few weeks with trying to troubleshoot the problem. I am not sleeping well. Very Bizarre dreams with waking in the night sometimes 2to 3 times with Hallucinations. I have never had this before and is beginning to worry me. ?My primary doctor Dr. Etter Sjogren spoke to you about this issue when I saw her last. ?I would like to discuss with you the possibility of getting Inspire Implant if I qualify. ?If you can see me sooner I would appreciate it. ?There is a [place] Trip I plan to go on in July2023 and was just wondering if this might be better for me. ?Thank you  ?[name] ) Summons ?06/ 23/ 1952 ? ? ? ?Dr. Elsworth Soho, please advise. ?

## 2021-05-14 ENCOUNTER — Other Ambulatory Visit: Payer: Self-pay | Admitting: Family Medicine

## 2021-05-14 NOTE — Telephone Encounter (Signed)
Do you manage her Repatha? ?

## 2021-05-17 ENCOUNTER — Telehealth: Payer: Self-pay | Admitting: Pulmonary Disease

## 2021-05-17 NOTE — Telephone Encounter (Signed)
Noted for appointment for 05/18/21. Nothing further needed.  ?

## 2021-05-18 ENCOUNTER — Ambulatory Visit (INDEPENDENT_AMBULATORY_CARE_PROVIDER_SITE_OTHER): Payer: Medicare Other | Admitting: Adult Health

## 2021-05-18 ENCOUNTER — Encounter: Payer: Self-pay | Admitting: Adult Health

## 2021-05-18 VITALS — BP 100/58 | HR 97 | Temp 98.0°F | Ht 66.0 in | Wt 165.4 lb

## 2021-05-18 DIAGNOSIS — I2510 Atherosclerotic heart disease of native coronary artery without angina pectoris: Secondary | ICD-10-CM

## 2021-05-18 DIAGNOSIS — R443 Hallucinations, unspecified: Secondary | ICD-10-CM | POA: Diagnosis not present

## 2021-05-18 DIAGNOSIS — G4733 Obstructive sleep apnea (adult) (pediatric): Secondary | ICD-10-CM

## 2021-05-18 NOTE — Assessment & Plan Note (Signed)
Moderate obstructive sleep apnea now with significant breakthrough events.  AHI at 25.  Patient is having multiple issues tolerating her machine.  We will adjust pressure from 8 to 15 cm H2O.  Also change mask to a DreamWear nasal.  We will set patient up for a CPAP titration study with having significant breakthrough events with an AHI of 25. (Advised on CPAP titration study may transition to BiPAP if needed) ? ?Plan  ?Patient Instructions  ?Change to Dream wear nasal mask  ?Set up for CPAP titration study.  ?Change CPAP pressure to 8 to 15cm  ?Download in 2 weeks  ?Wear CPAP all night long.  ?Do not drive if sleepy  ?Follow up in 3 months with Dr. Elsworth Soho  or Ernestyne Caldwell NP and As needed   ? ?  ? ?

## 2021-05-18 NOTE — Patient Instructions (Signed)
Change to Dream wear nasal mask  ?Set up for CPAP titration study.  ?Change CPAP pressure to 8 to 15cm  ?Download in 2 weeks  ?Wear CPAP all night long.  ?Do not drive if sleepy  ?Follow up in 3 months with Dr. Elsworth Soho  or Tobiah Celestine NP and As needed   ? ?

## 2021-05-18 NOTE — Progress Notes (Signed)
? ?'@Patient'  ID: Kelsey Church, female    DOB: [birth year], 71 y.o.   MRN: [id number] ? ?Chief Complaint  ?Patient presents with  ? Follow-up  ? ? ?Referring provider: ?Roma Schanz R, * ? ?HPI: ?71 year old female followed for moderate obstructive sleep apnea ?Patient is a retired Marine scientist ?Medical history significant for coronary artery disease, left renal carcinoma status post resection, chronic kidney disease ? ?TEST/EVENTS :  ?2013 PSG  moderate OSa with AHi 22/h & lowest desatn to 87% corrected by CPAP 9 cm with swift nasal pillows ? ?05/18/2021 Follow up : OSA  ?Patient returns for a follow-up visit for sleep apnea.  Patient says that she has been on CPAP for years.  Is always done very well up until recently.  She says her machine got old and she got a replacement machine.  She got the Luna CPAP.  Unfortunately she had multiple issues with this machine and is recently gotten the ResMed which she had before however she continues to have ongoing symptoms where she can only wear her machine for about 3 to 4 hours and then starts to have significant trouble tolerating.  She has significant water coming into her mask.  She says it is uncomfortable it is unclear if the pressure is too high or too low at times.  She wakes up unrefreshed feeling tired during the daytime.  She says she tries to wear it all night long but after about 3 to 4 hours is uncomfortable.  She does wear it for the rest of the night but does not get very good sleep.  CPAP download shows good compliance since receiving her machine with daily average usage at 6 hours.  Patient is on auto CPAP 8 to 12 cm.  AHI is 25/hour positive mask leaks.  We talked about changing mask to see if this would help as well.  Prior downloads have shows good control .  Patient denies any known stroke history.  However is having MRI later this month due to balance and fall issues.  Since having her nephrectomy patient says she has had multiple issues, along with  weight loss swallow issues.  Patient also complains of some intermittent confusion and possible hallucinations during her nighttime sleep. ? ? ? ?Allergies  ?Allergen Reactions  ? Atorvastatin   ? Rosuvastatin   ?  36m--Creatinine elevation  ? ? ?Immunization History  ?Administered Date(s) Administered  ? Fluad Quad(high Dose 65+) 12/26/2018, 12/07/2020  ? Hepatitis A 08/07/2008, 07/28/2010  ? Hepatitis A, Adult 08/07/2008, 07/28/2010  ? Hepatitis B 10/15/1984, 01/14/1985, 07/15/1996, 08/24/2009  ? Hepatitis B, adult 10/15/1984, 01/14/1985, 07/15/1996, 08/24/2009  ? Influenza Nasal 12/16/2011  ? Influenza Whole 10/16/2010  ? Influenza, High Dose Seasonal PF 11/27/2017  ? Influenza,inj,Quad PF,6+ Mos 12/03/2013, 12/10/2014  ? Influenza-Unspecified 10/15/2016  ? MMR 08/13/2008  ? OPV 08/12/1958  ? PFIZER(Purple Top)SARS-COV-2 Vaccination 03/22/2019, 04/16/2019, 02/13/2020  ? PPension scheme manager143yr& up 02/16/2021  ? Pneumococcal Conjugate-13 03/29/2016  ? Pneumococcal Polysaccharide-23 04/17/2017  ? Td 07/04/2006  ? Tdap 05/16/2013  ? Typhoid Inactivated 07/18/2012  ? Yellow Fever 07/18/2012  ? Zoster, Live 09/06/2012  ? ? ?Past Medical History:  ?Diagnosis Date  ? Arthritis   ? PAIN AND OA LEFT KNEE; S/P RIGHT TOTAL KNEE ARTHROPLASTY 06/10/13  ? [facility]  [facility]  ? Complication of anesthesia   ? "chills every evening at sundown x 2 weeks"- no fever  ? Depression   ?  Dysphagia   ? History of kidney cancer   ? History of shingles   ? NO RESIDUAL PROBLEMS  ? Hypertension   ? Pain   ? LOWER BACK PAIN  ? Sleep apnea   ? CPAP  ? ? ?Tobacco History: ?Social History  ? ?Tobacco Use  ?Smoking Status Never  ?Smokeless Tobacco Never  ? ?Counseling given: Not Answered ? ? ?Outpatient Medications Prior to Visit  ?Medication Sig Dispense Refill  ? acetaminophen (TYLENOL) 650 MG CR tablet Take 1,300 mg by mouth every 8 (eight) hours as needed for pain.     ? aspirin EC 81 MG tablet Take  1 tablet (81 mg total) by mouth daily. 90 tablet 3  ? cetirizine (ZYRTEC) 10 MG tablet Take 10 mg by mouth daily as needed for allergies.    ? citalopram (CELEXA) 20 MG tablet TAKE 1 TABLET AT BEDTIME 90 tablet 1  ? Evolocumab (REPATHA SURECLICK) 130 MG/ML SOAJ Inject into the skin every 14 (fourteen) days.    ? metoprolol succinate (TOPROL-XL) 25 MG 24 hr tablet Take 2 tablets (50 mg total) by mouth in the morning AND 1 tablet (25 mg total) every evening. 270 tablet 3  ? nitroGLYCERIN (NITROSTAT) 0.4 MG SL tablet Place 1 tablet (0.4 mg total) under the tongue every 5 (five) minutes as needed for chest pain. 25 tablet 3  ? pantoprazole (PROTONIX) 40 MG tablet Take 1 tablet (40 mg total) by mouth 2 (two) times daily before a meal. 60 tablet 3  ? ranolazine (RANEXA) 500 MG 12 hr tablet Take 1 tablet (500 mg total) by mouth 2 (two) times daily. 180 tablet 3  ? ?No facility-administered medications prior to visit.  ? ? ? ?Review of Systems:  ? ?Constitutional:   No  weight loss, night sweats,  Fevers, chills,  ?+fatigue, or  lassitude. ? ?HEENT:   No headaches,  Difficulty swallowing,  Tooth/dental problems, or  Sore throat,  ?              No sneezing, itching, ear ache, nasal congestion, post nasal drip,  ? ?CV:  No chest pain,  Orthopnea, PND, swelling in lower extremities, anasarca, dizziness, palpitations, syncope.  ? ?GI  No heartburn, indigestion, abdominal pain, nausea, vomiting, diarrhea, change in bowel habits, loss of appetite, bloody stools.  ? ?Resp: No shortness of breath with exertion or at rest.  No excess mucus, no productive cough,  No non-productive cough,  No coughing up of blood.  No change in color of mucus.  No wheezing.  No chest wall deformity ? ?Skin: no rash or lesions. ? ?GU: no dysuria, change in color of urine, no urgency or frequency.  No flank pain, no hematuria  ? ?MS:  No joint pain or swelling.  No decreased range of motion.  No back pain. ? ? ? ?Physical Exam ? ?BP (!) 100/58 (BP  Location: Left Arm, Patient Position: Sitting, Cuff Size: Normal)   Pulse 97   Temp 98 ?F (36.7 ?C) (Oral)   Ht '5\' 6"'  (1.676 m)   Wt 165 lb 6.4 oz (75 kg)   BMI 26.70 kg/m?  ? ?GEN: A/Ox3; pleasant , NAD, well nourished  ?  ?HEENT:  [place]/AT,   NOSE-clear, THROAT-clear, no lesions, no postnasal drip or exudate noted.  ?Class II-III MP airway ? ?NECK:  Supple w/ fair ROM; no JVD; normal carotid impulses w/o bruits; no thyromegaly or nodules palpated; no lymphadenopathy.   ? ?RESP  Clear  P &  A; w/o, wheezes/ rales/ or rhonchi. no accessory muscle use, no dullness to percussion ? ?CARD:  RRR, no m/r/g, no peripheral edema, pulses intact, no cyanosis or clubbing. ? ?GI:   Soft & nt; nml bowel sounds; no organomegaly or masses detected.  ? ?Musco: Warm bil, no deformities or joint swelling noted.  ? ?Neuro: alert, no focal deficits noted.   ? ?Skin: Warm, no lesions or rashes ? ? ? ?Lab Results: ? ?CBC ?   ?Component Value Date/Time  ? WBC 7.1 03/02/2021 1628  ? RBC 4.31 03/02/2021 1628  ? HGB 12.6 03/02/2021 1628  ? HGB 13.0 07/08/2019 1503  ? HCT 38.7 03/02/2021 1628  ? HCT 39.4 07/08/2019 1503  ? PLT 277.0 03/02/2021 1628  ? PLT 311 07/08/2019 1503  ? MCV 89.8 03/02/2021 1628  ? MCV 83 07/08/2019 1503  ? MCH 27.3 07/08/2019 1503  ? MCH 28.8 12/27/2013 1120  ? MCHC 32.6 03/02/2021 1628  ? RDW 15.4 03/02/2021 1628  ? RDW 13.4 07/08/2019 1503  ? LYMPHSABS 1.2 03/02/2021 1628  ? MONOABS 0.5 03/02/2021 1628  ? EOSABS 0.1 03/02/2021 1628  ? BASOSABS 0.0 03/02/2021 1628  ? ? ?BMET ?   ?Component Value Date/Time  ? NA 141 03/02/2021 1628  ? NA 141 07/08/2019 1502  ? K 4.0 03/02/2021 1628  ? CL 105 03/02/2021 1628  ? CO2 27 03/02/2021 1628  ? GLUCOSE 76 03/02/2021 1628  ? BUN 30 (H) 03/02/2021 1628  ? BUN 21 07/08/2019 1502  ? CREATININE 2.06 (H) 03/02/2021 1628  ? CALCIUM 9.3 03/02/2021 1628  ? GFRNONAA 48 (L) 07/08/2019 1502  ? GFRAA 55 (L) 07/08/2019 1502  ? ? ?BNP ?No results found for: BNP ? ?ProBNP ?   ?Component  Value Date/Time  ? PROBNP 35.0 04/17/2017 1427  ? ? ?Imaging: ?No results found. ? ? ? ?   ? View : No data to display.  ?  ?  ?  ? ? ?No results found for: NITRICOXIDE ? ? ? ? ? ?Assessment & Plan:  ? ?OSA (obstruc

## 2021-05-18 NOTE — Assessment & Plan Note (Signed)
Agree with proceeding with MRI.  CPAP titration studies pending. ?Use caution with any sedating medications. ?

## 2021-05-18 NOTE — Addendum Note (Signed)
Addended by: Vanessa Barbara on: 05/18/2021 02:18 PM ? ? Modules accepted: Orders ? ?

## 2021-05-19 ENCOUNTER — Other Ambulatory Visit: Payer: Self-pay | Admitting: Pharmacist

## 2021-05-19 ENCOUNTER — Telehealth: Payer: Self-pay

## 2021-05-19 DIAGNOSIS — G4733 Obstructive sleep apnea (adult) (pediatric): Secondary | ICD-10-CM

## 2021-05-19 DIAGNOSIS — E785 Hyperlipidemia, unspecified: Secondary | ICD-10-CM

## 2021-05-19 DIAGNOSIS — I25118 Atherosclerotic heart disease of native coronary artery with other forms of angina pectoris: Secondary | ICD-10-CM

## 2021-05-19 MED ORDER — REPATHA SURECLICK 140 MG/ML ~~LOC~~ SOAJ: 1.0000 mL | SUBCUTANEOUS | 3 refills | Status: DC

## 2021-05-19 NOTE — Telephone Encounter (Signed)
Opened in error

## 2021-05-24 ENCOUNTER — Ambulatory Visit
Admission: RE | Admit: 2021-05-24 | Discharge: 2021-05-24 | Disposition: A | Discharge status: Home / Self Care | Payer: Medicare Other | Source: Ambulatory Visit | Attending: Urology | Admitting: Urology

## 2021-05-24 ENCOUNTER — Ambulatory Visit
Admission: RE | Admit: 2021-05-24 | Discharge: 2021-05-24 | Disposition: A | Discharge status: Home / Self Care | Payer: Medicare Other | Source: Ambulatory Visit | Attending: Family Medicine | Admitting: Family Medicine

## 2021-05-24 DIAGNOSIS — I739 Peripheral vascular disease, unspecified: Secondary | ICD-10-CM | POA: Diagnosis not present

## 2021-05-24 DIAGNOSIS — Z905 Acquired absence of kidney: Secondary | ICD-10-CM | POA: Diagnosis not present

## 2021-05-24 DIAGNOSIS — Z9049 Acquired absence of other specified parts of digestive tract: Secondary | ICD-10-CM | POA: Diagnosis not present

## 2021-05-24 DIAGNOSIS — R443 Hallucinations, unspecified: Secondary | ICD-10-CM

## 2021-05-24 DIAGNOSIS — S0990XA Unspecified injury of head, initial encounter: Secondary | ICD-10-CM | POA: Diagnosis not present

## 2021-05-24 DIAGNOSIS — Z85528 Personal history of other malignant neoplasm of kidney: Secondary | ICD-10-CM | POA: Diagnosis not present

## 2021-05-24 IMAGING — MR MR HEAD W/O CM
11 series · 48 of 48 positions shown · non-contrast
Comparison: None.

CLINICAL DATA: Traumatic brain injury, new or progressive neuro
deficits

EXAM:
MRI HEAD WITHOUT CONTRAST
TECHNIQUE: Multiplanar, multiecho pulse sequences of the brain and surrounding
structures were obtained without intravenous contrast.

[Series 2: T1 · sagittal · 5.0mm · 0.45mm/px · 3 of 21 slices shown]
[im 1/21]
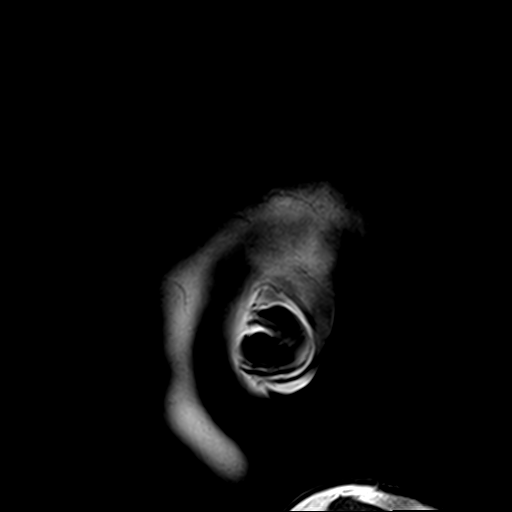
[im 11/21]
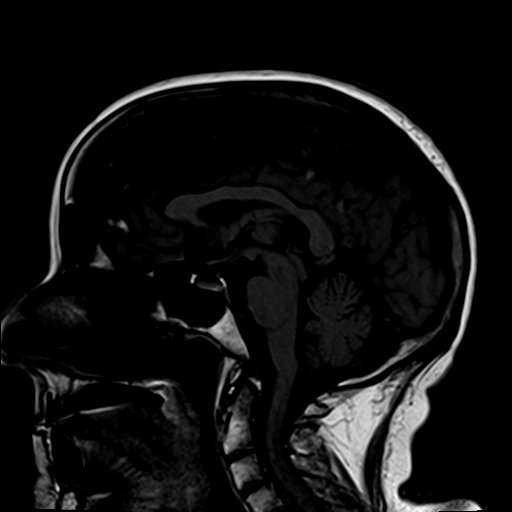
[im 21/21]
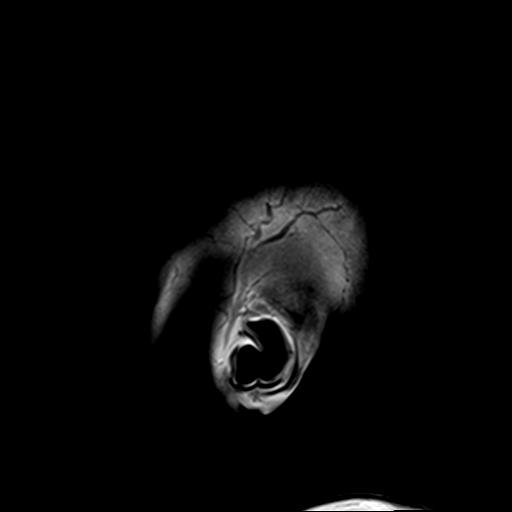

[Series 3: DWI · axial · 3.0mm · 1.80mm/px · z∈[-86,+56]mm · 8 of 98 slices shown (1 of 4)]
[im 1/98]
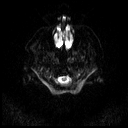
[im 14/98]
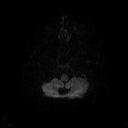
[im 28/98]
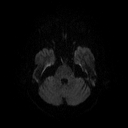
[im 42/98]
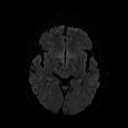
[im 56/98]
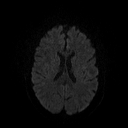
[im 70/98]
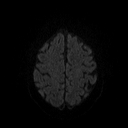
[im 84/98]
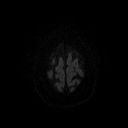
[im 98/98]
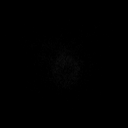

[Series 4: DWI · axial · 3.0mm · 1.80mm/px · z∈[-86,+56]mm · 4 of 50 slices shown (2 of 4)]
[im 1/50]
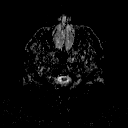
[im 17/50]
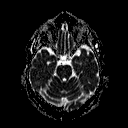
[im 33/50]
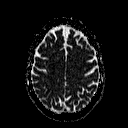
[im 50/50]
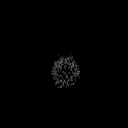

[Series 5: DWI · coronal · 5.0mm · 1.80mm/px · 6 of 68 slices shown (3 of 4)]
[im 1/68]
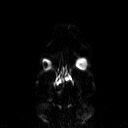
[im 14/68]
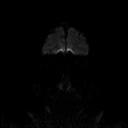
[im 27/68]
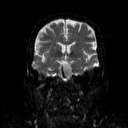
[im 41/68]
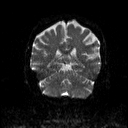
[im 54/68]
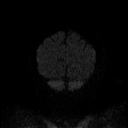
[im 68/68]
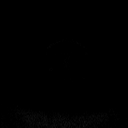

[Series 6: DWI · coronal · 5.0mm · 1.80mm/px · 3 of 35 slices shown (4 of 4)]
[im 1/35]
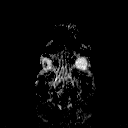
[im 18/35]
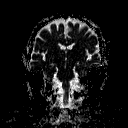
[im 35/35]
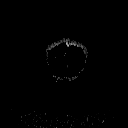

[Series 7: T2 · axial · 5.0mm · 0.51mm/px · z∈[-91,+52]mm · 2 of 22 slices shown (1 of 2)]
[im 1/22]
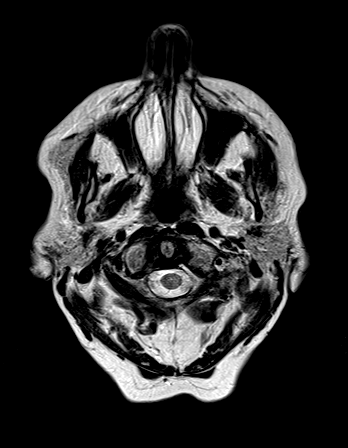
[im 22/22]
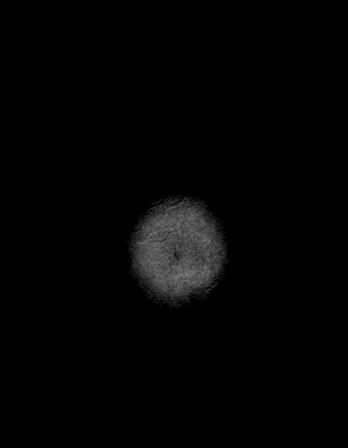

[Series 8: FLAIR · axial · 3.0mm · 0.45mm/px · z∈[-85,+45]mm · 3 of 30 slices shown]
[im 1/30]
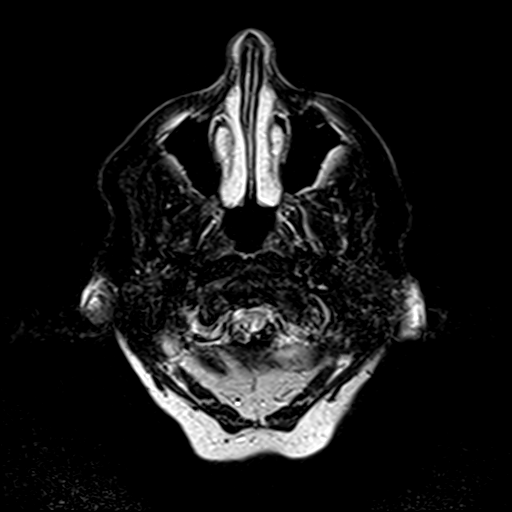
[im 15/30]
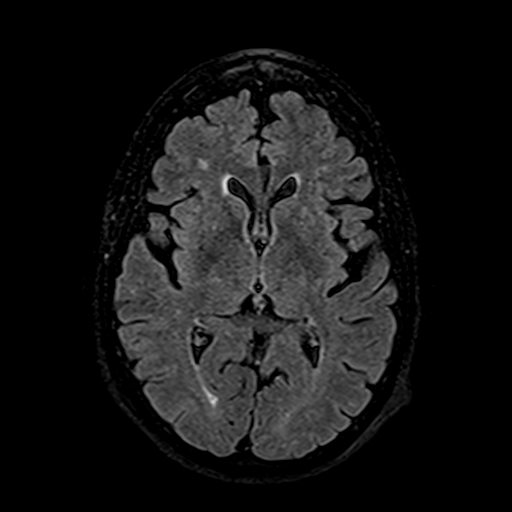
[im 30/30]
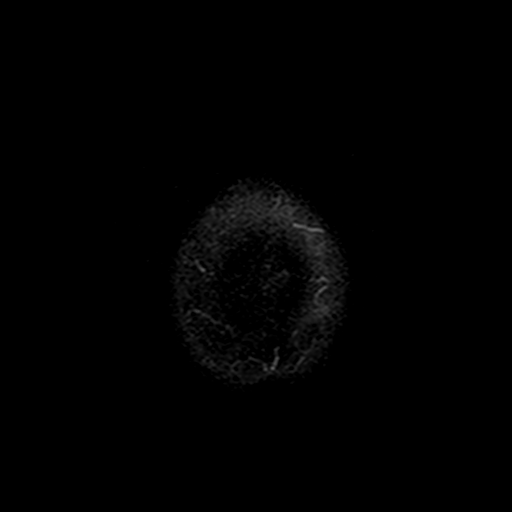

[Series 9: mip_images(sw) · axial · 32.0mm · 0.90mm/px · z∈[-74,+34]mm · 2 of 29 slices shown]
[im 1/29]
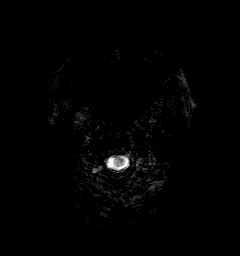
[im 29/29]
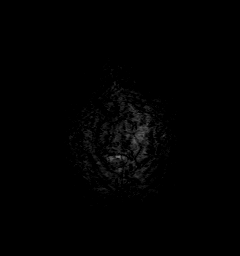

[Series 10: swi_images · axial · 4.0mm · 0.90mm/px · z∈[-88,+48]mm · 3 of 36 slices shown]
[im 1/36]
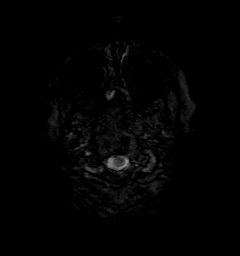
[im 18/36]
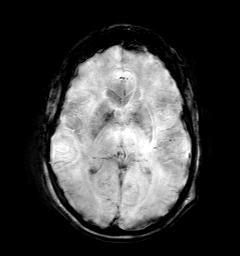
[im 36/36]
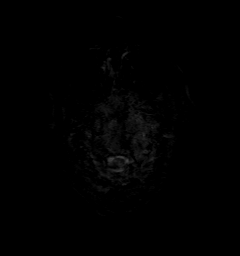

[Series 11: t1_mpr_tra · axial · 1.0mm · 0.71mm/px · z∈[-89,+50]mm · 12 of 144 slices shown]
[im 1/144]
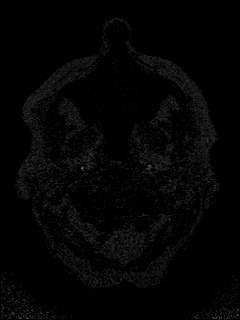
[im 14/144]
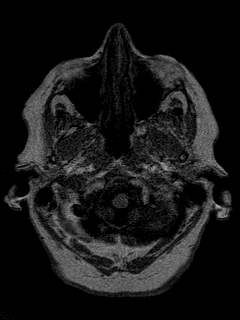
[im 27/144]
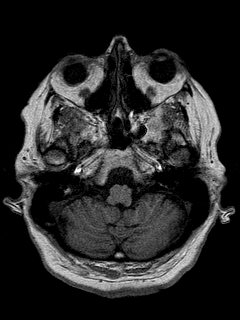
[im 40/144]
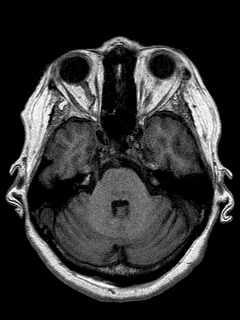
[im 53/144]
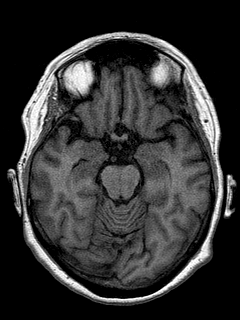
[im 66/144]
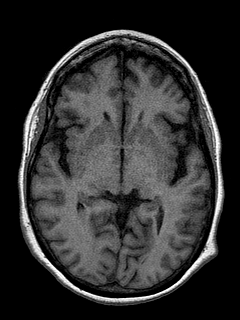
[im 79/144]
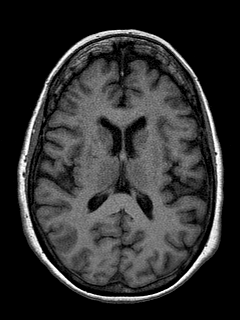
[im 92/144]
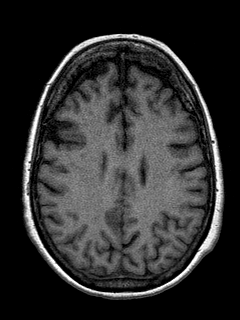
[im 105/144]
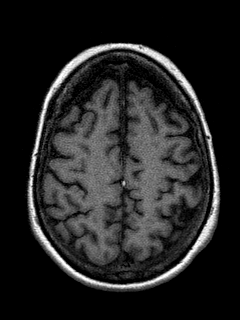
[im 118/144]
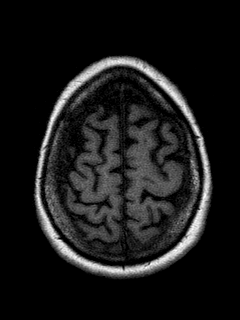
[im 131/144]
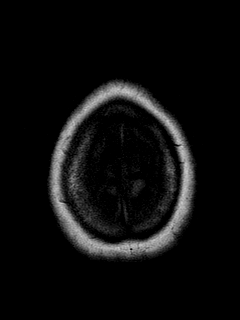
[im 144/144]
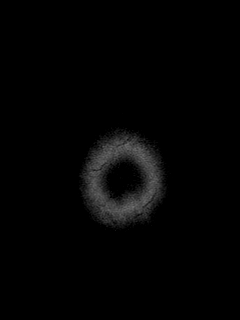

[Series 12: T2 · coronal · 5.0mm · 0.45mm/px · 2 of 26 slices shown (2 of 2)]
[im 1/26]
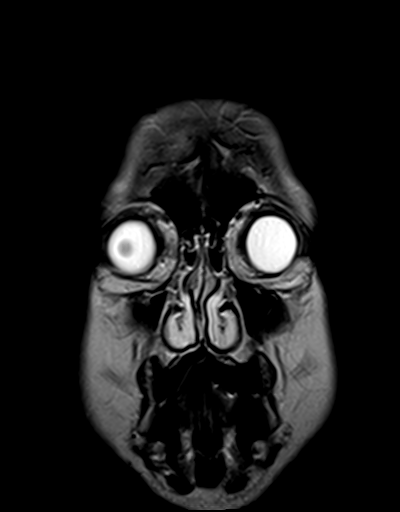
[im 26/26]
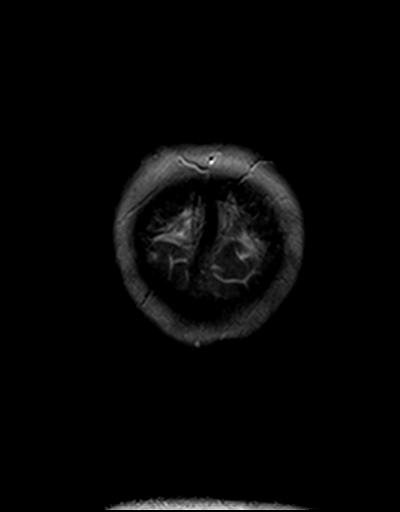

[48 of 48 positions shown; findings below may reference images not displayed]

FINDINGS: Brain: There is no evidence of acute intracranial hemorrhage,
extra-axial fluid collection, or acute infarct.

Background parenchymal volume is normal for age. The ventricles are
normal in size. Gray-white differentiation is preserved. Scattered
foci of FLAIR signal abnormality in the subcortical and
periventricular white matter are nonspecific but likely reflects
sequela of chronic white matter microangiopathy.

There is no suspicious parenchymal signal abnormality. There is no
evidence of old blood products in the brain. There is no mass
lesion. There is no mass effect or midline shift.

Vascular: Normal flow voids.

Skull and upper cervical spine: Normal marrow signal.

Sinuses/Orbits: The paranasal sinuses are clear. The globes and
orbits are unremarkable

Other: None.
IMPRESSION: 1. No acute intracranial pathology. No evidence of old blood
products or traumatic brain injury.
2. Mild chronic white matter microangiopathy, within expected limits
for age.

## 2021-05-24 IMAGING — MR MR ABDOMEN WO/W CM
17 series · 48 of 48 positions shown · IV contrast (14ml multihance)
Comparison: MRI abdomen [DATE]

CLINICAL DATA: History of renal cancer

EXAM:
MRI ABDOMEN WITHOUT AND WITH CONTRAST
TECHNIQUE: Multiplanar multisequence MR imaging of the abdomen was performed
both before and after the administration of intravenous contrast.
CONTRAST:  14mL MULTIHANCE GADOBENATE DIMEGLUMINE 529 MG/ML IV SOLN

[Series 5: T2 · coronal · 5.0mm · 0.74mm/px · 1 of 20 slices shown (1 of 3)]
[im 1/20]
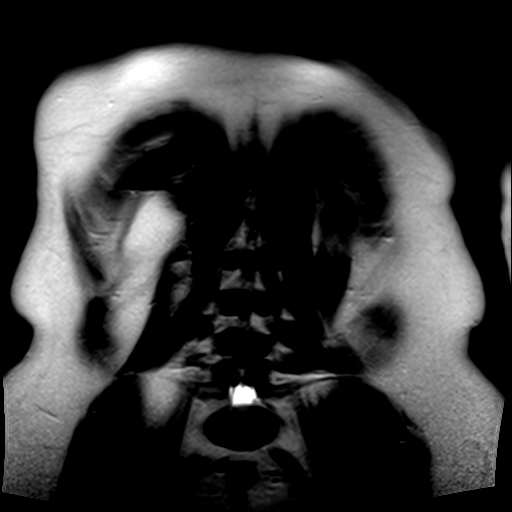

[Series 6: T2 · axial · 5.0mm · 0.68mm/px · 1 of 32 slices shown (2 of 3)]
[im 1/32]
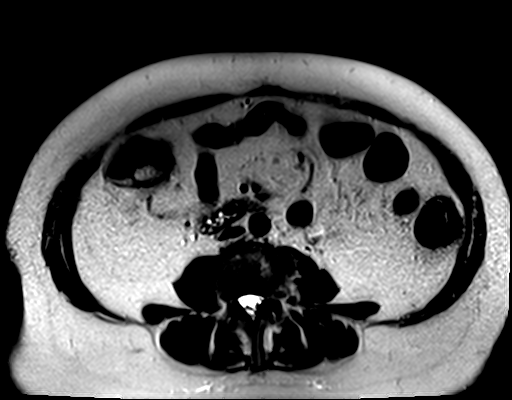

[Series 7: ep2d_diff_b50_500_800_p2_trig · axial · 5.0mm · 1.82mm/px · z∈[-64,+98]mm · 3 of 83 slices shown]
[im 1/83]
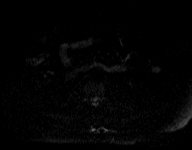
[im 42/83]
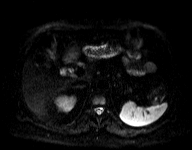
[im 83/83]
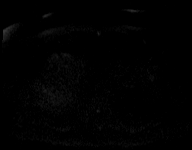

[Series 8: ep2d_diff_b50_500_800_p2_trig_adc · axial · 5.0mm · 1.82mm/px · 1 of 28 slices shown]
[im 1/28]
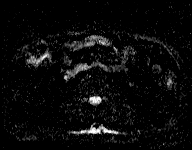

[Series 9: T2 · axial · 5.0mm · 1.37mm/px · 1 of 32 slices shown (3 of 3)]
[im 1/32]
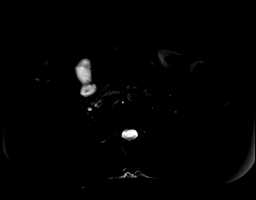

[Series 10: bSSFP · axial · 4.0mm · 0.68mm/px · 1 of 46 slices shown]
[im 1/46]
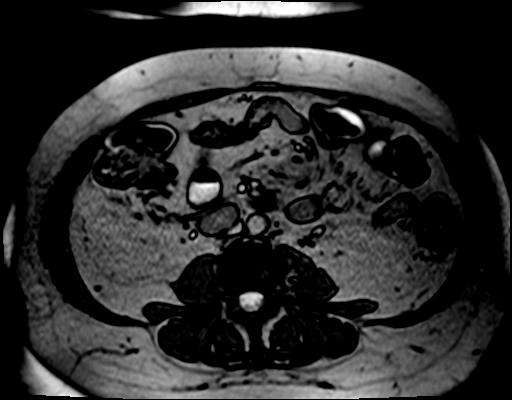

[Series 11: T1 · axial · 5.0mm · 0.68mm/px · 1 of 56 slices shown]
[im 1/56]
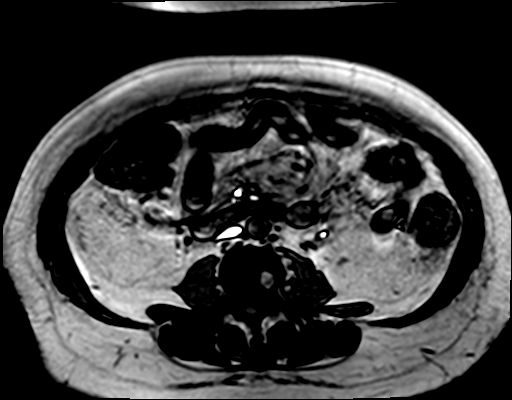

[Series 12: T1 dynamic · axial · non-contrast · 2.3mm · 1.37mm/px · z∈[-49,+128]mm · 4 of 80 slices shown]
[im 1/80]
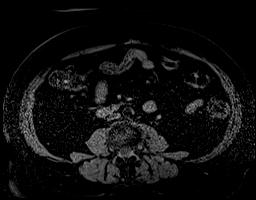
[im 27/80]
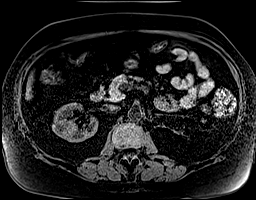
[im 53/80]
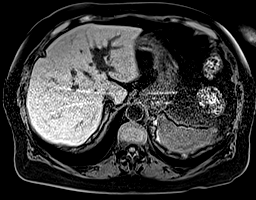
[im 80/80]
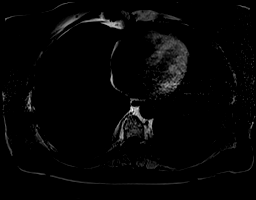

[Series 13: post 25 sec · axial · 2.3mm · 1.37mm/px · z∈[-49,+128]mm · 4 of 80 slices shown]
[im 1/80]
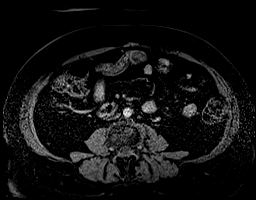
[im 27/80]
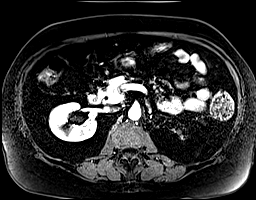
[im 53/80]
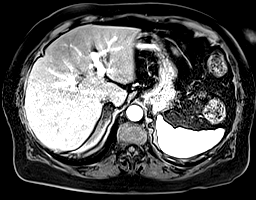
[im 80/80]
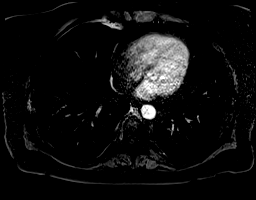

[Series 14: post 25 sec_sub · axial · 2.3mm · 1.37mm/px · z∈[-49,+128]mm · 4 of 80 slices shown]
[im 1/80]
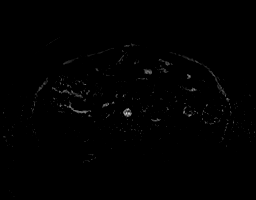
[im 27/80]
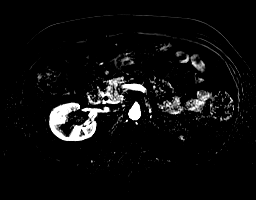
[im 53/80]
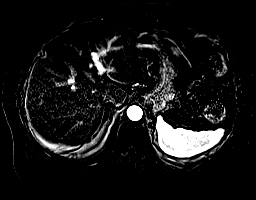
[im 80/80]
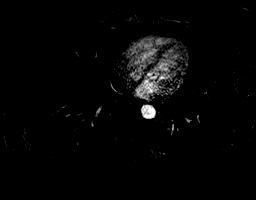

[Series 15: post 45 sec · axial · 2.3mm · 1.37mm/px · z∈[-49,+128]mm · 4 of 80 slices shown]
[im 1/80]
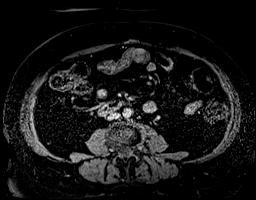
[im 27/80]
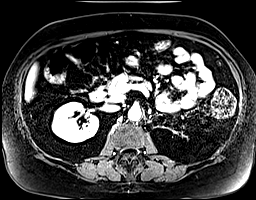
[im 53/80]
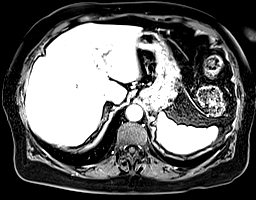
[im 80/80]
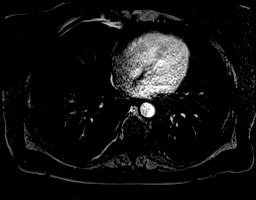

[Series 16: post 45 sec_sub · axial · 2.3mm · 1.37mm/px · z∈[-49,+128]mm · 4 of 80 slices shown]
[im 1/80]
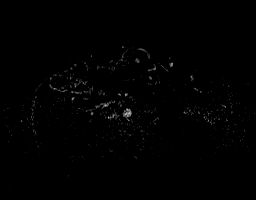
[im 27/80]
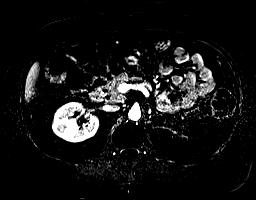
[im 53/80]
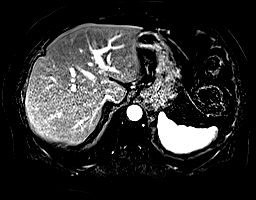
[im 80/80]
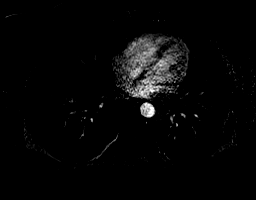

[Series 17: post 90 sec · axial · 2.3mm · 1.37mm/px · z∈[-49,+128]mm · 4 of 80 slices shown]
[im 1/80]
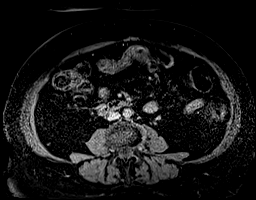
[im 27/80]
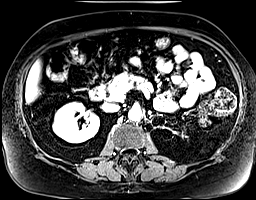
[im 53/80]
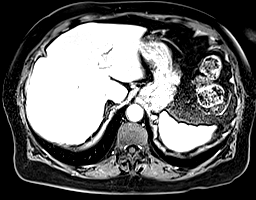
[im 80/80]
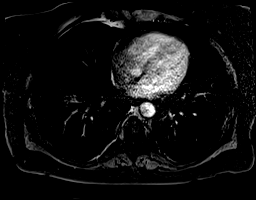

[Series 18: post 90 sec_sub · axial · 2.3mm · 1.37mm/px · z∈[-49,+128]mm · 4 of 80 slices shown]
[im 1/80]
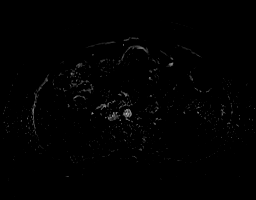
[im 27/80]
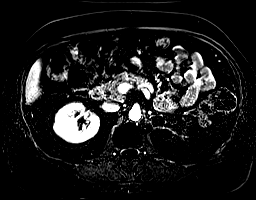
[im 53/80]
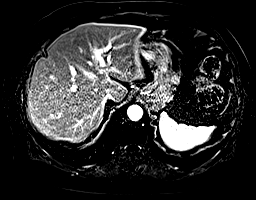
[im 80/80]
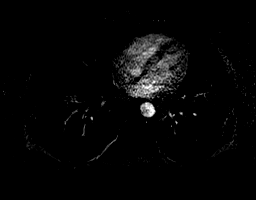

[Series 19: T1 dynamic post-contrast · coronal · 2.5mm · 0.74mm/px · 3 of 60 slices shown]
[im 1/60]
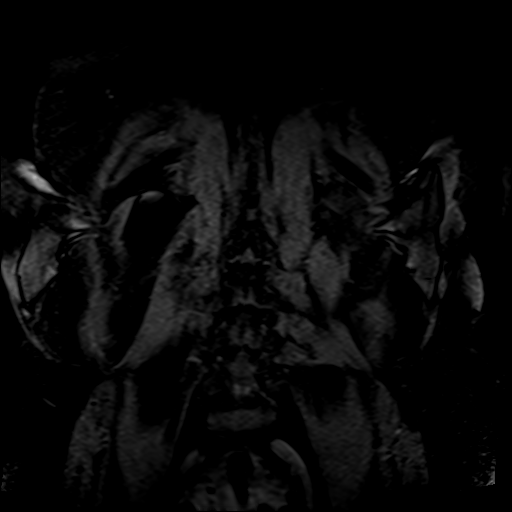
[im 30/60]
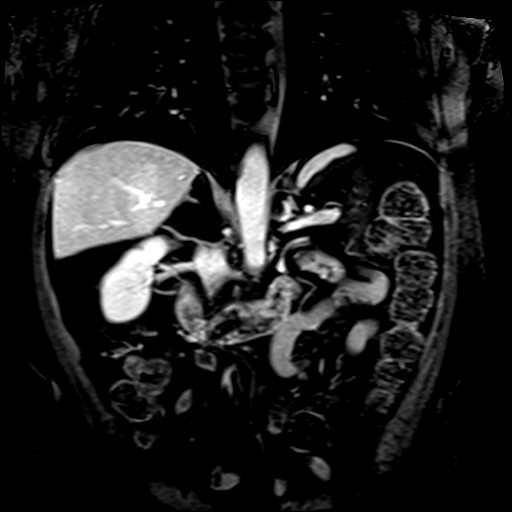
[im 60/60]
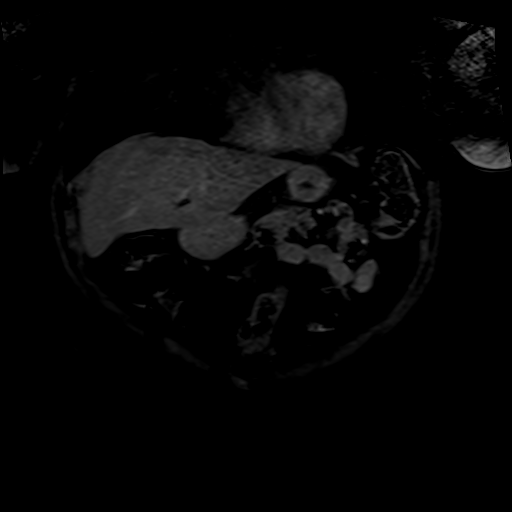

[Series 20: post axial 3+ · axial · 2.3mm · 1.37mm/px · z∈[-49,+128]mm · 4 of 80 slices shown (1 of 2)]
[im 1/80]
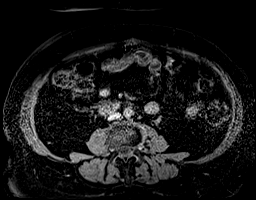
[im 27/80]
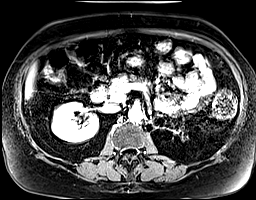
[im 53/80]
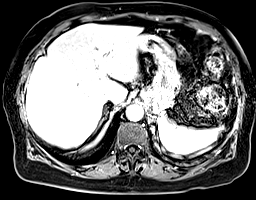
[im 80/80]
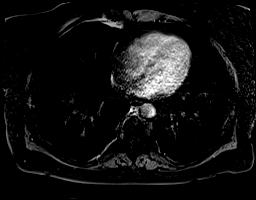

[Series 21: post axial 3+ · axial · 2.3mm · 1.37mm/px · z∈[-49,+128]mm · 4 of 80 slices shown (2 of 2)]
[im 1/80]
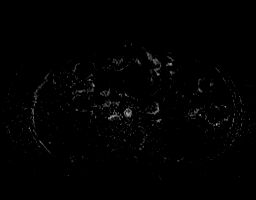
[im 27/80]
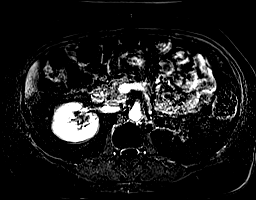
[im 53/80]
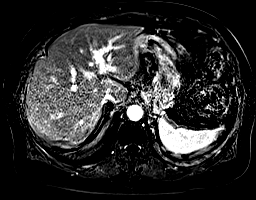
[im 80/80]
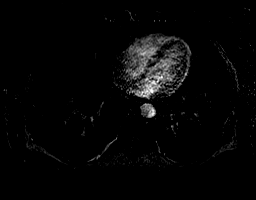

[48 of 48 positions shown; findings below may reference images not displayed]

FINDINGS: Study is limited due to motion.

Lower chest: No acute findings.

Hepatobiliary: Liver is normal in size and contour with no
suspicious mass identified. Gallbladder is surgically absent. No
significant biliary ductal dilatation identified.

Pancreas: No mass, inflammatory changes, or other parenchymal
abnormality identified.

Spleen:  Within normal limits in size and appearance.

Adrenals/Urinary Tract: Previous left nephrectomy surgical changes.
No suspicious enhancement or nodularity in the region of left
nephrectomy bed. Right kidney appears normal. No adrenal mass
identified.

Stomach/Bowel: Visualized portions within the abdomen are
unremarkable.

Vascular/Lymphatic: No pathologically enlarged lymph nodes
identified. No abdominal aortic aneurysm demonstrated.

Other:  No ascites.

Musculoskeletal: No suspicious bone lesions identified.
IMPRESSION: No evidence of recurrent or metastatic disease. Previous left
nephrectomy changes.

## 2021-05-24 MED ORDER — GADOBENATE DIMEGLUMINE 529 MG/ML IV SOLN
14.0000 mL | Freq: Once | INTRAVENOUS | Status: AC | PRN
  Administered 2021-05-24: 14 mL via INTRAVENOUS

## 2021-05-31 ENCOUNTER — Ambulatory Visit (INDEPENDENT_AMBULATORY_CARE_PROVIDER_SITE_OTHER): Payer: Medicare Other

## 2021-05-31 VITALS — Ht 66.0 in | Wt 165.0 lb

## 2021-05-31 DIAGNOSIS — Z1231 Encounter for screening mammogram for malignant neoplasm of breast: Secondary | ICD-10-CM | POA: Diagnosis not present

## 2021-05-31 DIAGNOSIS — Z780 Asymptomatic menopausal state: Secondary | ICD-10-CM

## 2021-05-31 DIAGNOSIS — Z0000 Encounter for general adult medical examination without abnormal findings: Secondary | ICD-10-CM

## 2021-05-31 NOTE — Progress Notes (Signed)
? ?Subjective:  ? Kelsey Church is a 71 y.o. female who presents for Medicare Annual (Subsequent) preventive examination. ? ?I connected with Kelsey Church today by telephone and verified that I am speaking with the correct person using two identifiers. ?Location patient: home ?Location provider: work ?Persons participating in the virtual visit: patient, nurse.  ?  ?I discussed the limitations, risks, security and privacy concerns of performing an evaluation and management service by telephone and the availability of in person appointments. I also discussed with the patient that there may be a patient responsible charge related to this service. The patient expressed understanding and verbally consented to this telephonic visit.  ?  ?Interactive audio and video telecommunications were attempted between this provider and patient, however failed, due to patient having technical difficulties OR patient did not have access to video capability.  We continued and completed visit with audio only. ? ?Some vital signs may be absent or patient reported.  ? ?Time Spent with patient on telephone encounter:25 minutes ? ? ?Review of Systems    ? ?Cardiac Risk Factors include: advanced age (>39mn, >>85women);hypertension;dyslipidemia ? ?   ?Objective:  ?  ?Today's Vitals  ? 05/31/21 0831  ?Weight: 165 lb (74.8 kg)  ?Height: '5\' 6"'$  (1.676 m)  ? ?Body mass index is 26.63 kg/m?. ? ? ?  05/31/2021  ?  8:35 AM 09/01/2020  ? 10:19 AM 07/11/2019  ?  6:34 AM 09/10/2018  ?  5:07 PM 12/03/2017  ?  2:49 PM 09/12/2017  ? 11:01 AM 07/28/2016  ?  9:49 PM  ?Advanced Directives  ?Does Patient Have a Medical Advance Directive? No No No No No No Yes  ?Does patient want to make changes to medical advance directive?      No - Patient declined   ?Would patient like information on creating a medical advance directive?  Yes (MAU/Ambulatory/Procedural Areas - Information given) No - Patient declined No - Patient declined     ? ? ?Current Medications  (verified) ?Outpatient Encounter Medications as of 05/31/2021  ?Medication Sig  ? acetaminophen (TYLENOL) 650 MG CR tablet Take 1,300 mg by mouth every 8 (eight) hours as needed for pain.   ? aspirin EC 81 MG tablet Take 1 tablet (81 mg total) by mouth daily.  ? cetirizine (ZYRTEC) 10 MG tablet Take 10 mg by mouth daily as needed for allergies.  ? citalopram (CELEXA) 20 MG tablet TAKE 1 TABLET AT BEDTIME  ? Evolocumab (REPATHA SURECLICK) 1009MG/ML SOAJ Inject 1 mL into the skin every 14 (fourteen) days.  ? metoprolol succinate (TOPROL-XL) 25 MG 24 hr tablet Take 2 tablets (50 mg total) by mouth in the morning AND 1 tablet (25 mg total) every evening.  ? nitroGLYCERIN (NITROSTAT) 0.4 MG SL tablet Place 1 tablet (0.4 mg total) under the tongue every 5 (five) minutes as needed for chest pain.  ? pantoprazole (PROTONIX) 40 MG tablet Take 1 tablet (40 mg total) by mouth 2 (two) times daily before a meal.  ? ranolazine (RANEXA) 500 MG 12 hr tablet Take 1 tablet (500 mg total) by mouth 2 (two) times daily.  ? ?No facility-administered encounter medications on file as of 05/31/2021.  ? ? ?Allergies (verified) ?Atorvastatin and Rosuvastatin  ? ?History: ?Past Medical History:  ?Diagnosis Date  ? Arthritis   ? PAIN AND OA LEFT KNEE; S/P RIGHT TOTAL KNEE ARTHROPLASTY 06/10/13  ? Cancer ([facility]   ? melanoma in 2 sights  ? Complication of anesthesia   ? "  chills every evening at sundown x 2 weeks"- no fever  ? Depression   ? Dysphagia   ? History of kidney cancer   ? History of shingles   ? NO RESIDUAL PROBLEMS  ? Hypertension   ? Pain   ? LOWER BACK PAIN  ? Sleep apnea   ? CPAP  ? ?Past Surgical History:  ?Procedure Laterality Date  ? APPENDECTOMY  1958  ? COLONOSCOPY    ? COLONOSCOPY W/ POLYPECTOMY    ? 7 polyps  ? ESOPHAGEAL MANOMETRY N/A 03/17/2021  ? Procedure: ESOPHAGEAL MANOMETRY (EM);  Surgeon: Jerene Bears, MD;  Location: [facility];  Service: Gastroenterology;  Laterality: N/A;  ? FOOT SURGERY Right   ? removal plantar wart   ? GALLBLADDER SURGERY  02/14/1990  ? INJECTION KNEE  09/2011  ? right   ? KNEE ARTHROSCOPY  1996  ? LEFT HEART CATH AND CORONARY ANGIOGRAPHY N/A 07/11/2019  ? Procedure: LEFT HEART CATH AND CORONARY ANGIOGRAPHY;  Surgeon: Belva Crome, MD;  Location: [facility];  Service: Cardiovascular;  Laterality: N/A;  ? [place]  ? right chin, back  ? NEPHRECTOMY Left 2022  ? POLYPECTOMY    ? SALPINGOOPHORECTOMY  2009  ? Right  ? TOTAL KNEE ARTHROPLASTY Right 06/10/2013  ? Procedure: RIGHT TOTAL KNEE ARTHROPLASTY;  Surgeon: [name], MD;  Location: [facility];  Service: Orthopedics;  Laterality: Right;  ? TOTAL KNEE ARTHROPLASTY Left 12/16/2013  ? Procedure: LEFT TOTAL KNEE ARTHROPLASTY;  Surgeon: [name], MD;  Location: [facility];  Service: Orthopedics;  Laterality: Left;  ? ?Family History  ?Problem Relation Age of Onset  ? Heart disease Father   ? Heart attack Father 66  ?     83  ? Hypertension Father   ? Osteoporosis Mother   ? Breast cancer Mother   ? Cancer Maternal Grandmother   ?     Breast and Uterine Cancer  ? Breast cancer Maternal Grandmother   ? Thyroid cancer Maternal Grandmother   ? Hypertension Sister   ? Diabetes Sister   ? Colon polyps Sister   ? Colon cancer Neg Hx   ? Rectal cancer Neg Hx   ? Stomach cancer Neg Hx   ? Esophageal cancer Neg Hx   ? Allergic rhinitis Neg Hx   ? Asthma Neg Hx   ? Eczema Neg Hx   ? Urticaria Neg Hx   ? ?Social History  ? ?Socioeconomic History  ? Marital status: Widowed  ?  Spouse name: Not on file  ? Number of children: 1  ? Years of education: Not on file  ? Highest education level: Not on file  ?Occupational History  ? Occupation: CMA  ?  Employer: [employer]  ?Tobacco Use  ? Smoking status: Never  ? Smokeless tobacco: Never  ?Vaping Use  ? Vaping Use: Never used  ?Substance and Sexual Activity  ? Alcohol use: No  ?  Alcohol/week: 0.0 standard drinks  ? Drug use: No  ? Sexual activity: Not Currently  ?  Comment: 1st intercourse- 18,  partners- 1,   ?Other Topics Concern  ? Not on file  ?Social History Narrative  ? Lives alone.  Widow.  One son.   ? Caffienated drinks-no  ? Seat belt use often-yes  ? Regular Exercise-yes  ? Smoke alarm in the home-yes  ? Firearms/guns in the home-no  ? History of physical abuse-no  ?   ?   ?   ? ?  Social Determinants of Health  ? ?Financial Resource Strain: Low Risk   ? Difficulty of Paying Living Expenses: Not hard at all  ?Food Insecurity: No Food Insecurity  ? Worried About Charity fundraiser in the Last Year: Never true  ? Ran Out of Food in the Last Year: Never true  ?Transportation Needs: No Transportation Needs  ? Lack of Transportation (Medical): No  ? Lack of Transportation (Non-Medical): No  ?Physical Activity: Sufficiently Active  ? Days of Exercise per Week: 4 days  ? Minutes of Exercise per Session: 60 min  ?Stress: No Stress Concern Present  ? Feeling of Stress : Only a little  ?Social Connections: Moderately Integrated  ? Frequency of Communication with Friends and Family: More than three times a week  ? Frequency of Social Gatherings with Friends and Family: More than three times a week  ? Attends Religious Services: More than 4 times per year  ? Active Member of Clubs or Organizations: Yes  ? Attends Archivist Meetings: More than 4 times per year  ? Marital Status: Widowed  ? ? ?Tobacco Counseling ?Counseling given: Not Answered ? ? ?Clinical Intake: ? ?Pre-visit preparation completed: Yes ? ?Pain : No/denies pain ? ?  ? ?BMI - recorded: 26.63 ?Nutritional Status: BMI 25 -29 Overweight ?Nutritional Risks: None ?Diabetes: No ? ?How often do you need to have someone help you when you read instructions, pamphlets, or other written materials from your doctor or pharmacy?: 1 - Never ? ?Diabetic?No ? ?Interpreter Needed?: No ? ?Information entered by :: Caroleen Hamman LPN ? ? ?Activities of Daily Living ? ?  05/31/2021  ?  8:40 AM 08/27/2020  ?  1:23 PM  ?In your present state of health, do  you have any difficulty performing the following activities:  ?Hearing? 0 0  ?Vision? 0 0  ?Difficulty concentrating or making decisions? 0 0  ?Walking or climbing stairs? 1 0  ?Comment stairs   ?Dressing or bathing? 0 0

## 2021-05-31 NOTE — Patient Instructions (Signed)
Kelsey Church , ?Thank you for taking time to complete your Medicare Wellness Visit. I appreciate your ongoing commitment to your health goals. Please review the following plan we discussed and let me know if I can assist you in the future.  ? ?Screening recommendations/referrals: ?Colonoscopy: Completed 06/03/2020-Due 06/03/2025 ?Mammogram: Ordered today. Someone will call you to schedule. ?Bone Density: Ordered today. Someone will call you to schedule. ?Recommended yearly ophthalmology/optometry visit for glaucoma screening and checkup ?Recommended yearly dental visit for hygiene and checkup ? ?Vaccinations: ?Influenza vaccine: Up to date ?Pneumococcal vaccine: Up to date ?Tdap vaccine: Up to date ?Shingles vaccine: Due-May obtain vaccine at your local pharmacy. ?Covid-19:Up to date ? ?Advanced directives: My pick up forms at our office. ? ?Conditions/risks identified: See problem list ? ?Next appointment: Follow up in one year for your annual wellness visit  ? ? ?Preventive Care 71 Years and Older, Female ?Preventive care refers to lifestyle choices and visits with your health care provider that can promote health and wellness. ?What does preventive care include? ?A yearly physical exam. This is also called an annual well check. ?Dental exams once or twice a year. ?Routine eye exams. Ask your health care provider how often you should have your eyes checked. ?Personal lifestyle choices, including: ?Daily care of your teeth and gums. ?Regular physical activity. ?Eating a healthy diet. ?Avoiding tobacco and drug use. ?Limiting alcohol use. ?Practicing safe sex. ?Taking low-dose aspirin every day. ?Taking vitamin and mineral supplements as recommended by your health care provider. ?What happens during an annual well check? ?The services and screenings done by your health care provider during your annual well check will depend on your age, overall health, lifestyle risk factors, and family history of disease. ?Counseling   ?Your health care provider may ask you questions about your: ?Alcohol use. ?Tobacco use. ?Drug use. ?Emotional well-being. ?Home and relationship well-being. ?Sexual activity. ?Eating habits. ?History of falls. ?Memory and ability to understand (cognition). ?Work and work Statistician. ?Reproductive health. ?Screening  ?You may have the following tests or measurements: ?Height, weight, and BMI. ?Blood pressure. ?Lipid and cholesterol levels. These may be checked every 5 years, or more frequently if you are over 71 years old. ?Skin check. ?Lung cancer screening. You may have this screening every year starting at age 71 if you have a 30-pack-year history of smoking and currently smoke or have quit within the past 15 years. ?Fecal occult blood test (FOBT) of the stool. You may have this test every year starting at age 71. ?Flexible sigmoidoscopy or colonoscopy. You may have a sigmoidoscopy every 5 years or a colonoscopy every 10 years starting at age 71. ?Hepatitis C blood test. ?Hepatitis B blood test. ?Sexually transmitted disease (STD) testing. ?Diabetes screening. This is done by checking your blood sugar (glucose) after you have not eaten for a while (fasting). You may have this done every 1-3 years. ?Bone density scan. This is done to screen for osteoporosis. You may have this done starting at age 71. ?Mammogram. This may be done every 1-2 years. Talk to your health care provider about how often you should have regular mammograms. ?Talk with your health care provider about your test results, treatment options, and if necessary, the need for more tests. ?Vaccines  ?Your health care provider may recommend certain vaccines, such as: ?Influenza vaccine. This is recommended every year. ?Tetanus, diphtheria, and acellular pertussis (Tdap, Td) vaccine. You may need a Td booster every 10 years. ?Zoster vaccine. You may need this after age 71. ?  Pneumococcal 13-valent conjugate (PCV13) vaccine. One dose is recommended  after age 71. ?Pneumococcal polysaccharide (PPSV23) vaccine. One dose is recommended after age 71. ?Talk to your health care provider about which screenings and vaccines you need and how often you need them. ?This information is not intended to replace advice given to you by your health care provider. Make sure you discuss any questions you have with your health care provider. ?Document Released: 02/27/2015 Document Revised: 10/21/2015 Document Reviewed: 12/02/2014 ?Elsevier Interactive Patient Education ? 2017 [employer]. ? ?Fall Prevention in the Home ?Falls can cause injuries. They can happen to people of all ages. There are many things you can do to make your home safe and to help prevent falls. ?What can I do on the outside of my home? ?Regularly fix the edges of walkways and driveways and fix any cracks. ?Remove anything that might make you trip as you walk through a door, such as a raised step or threshold. ?Trim any bushes or trees on the path to your home. ?Use bright outdoor lighting. ?Clear any walking paths of anything that might make someone trip, such as rocks or tools. ?Regularly check to see if handrails are loose or broken. Make sure that both sides of any steps have handrails. ?Any raised decks and porches should have guardrails on the edges. ?Have any leaves, snow, or ice cleared regularly. ?Use sand or salt on walking paths during winter. ?Clean up any spills in your garage right away. This includes oil or grease spills. ?What can I do in the bathroom? ?Use night lights. ?Install grab bars by the toilet and in the tub and shower. Do not use towel bars as grab bars. ?Use non-skid mats or decals in the tub or shower. ?If you need to sit down in the shower, use a plastic, non-slip stool. ?Keep the floor dry. Clean up any water that spills on the floor as soon as it happens. ?Remove soap buildup in the tub or shower regularly. ?Attach bath mats securely with double-sided non-slip rug tape. ?Do not  have throw rugs and other things on the floor that can make you trip. ?What can I do in the bedroom? ?Use night lights. ?Make sure that you have a light by your bed that is easy to reach. ?Do not use any sheets or blankets that are too big for your bed. They should not hang down onto the floor. ?Have a firm chair that has side arms. You can use this for support while you get dressed. ?Do not have throw rugs and other things on the floor that can make you trip. ?What can I do in the kitchen? ?Clean up any spills right away. ?Avoid walking on wet floors. ?Keep items that you use a lot in easy-to-reach places. ?If you need to reach something above you, use a strong step stool that has a grab bar. ?Keep electrical cords out of the way. ?Do not use floor polish or wax that makes floors slippery. If you must use wax, use non-skid floor wax. ?Do not have throw rugs and other things on the floor that can make you trip. ?What can I do with my stairs? ?Do not leave any items on the stairs. ?Make sure that there are handrails on both sides of the stairs and use them. Fix handrails that are broken or loose. Make sure that handrails are as long as the stairways. ?Check any carpeting to make sure that it is firmly attached to the stairs. Fix any  carpet that is loose or worn. ?Avoid having throw rugs at the top or bottom of the stairs. If you do have throw rugs, attach them to the floor with carpet tape. ?Make sure that you have a light switch at the top of the stairs and the bottom of the stairs. If you do not have them, ask someone to add them for you. ?What else can I do to help prevent falls? ?Wear shoes that: ?Do not have high heels. ?Have rubber bottoms. ?Are comfortable and fit you well. ?Are closed at the toe. Do not wear sandals. ?If you use a stepladder: ?Make sure that it is fully opened. Do not climb a closed stepladder. ?Make sure that both sides of the stepladder are locked into place. ?Ask someone to hold it for  you, if possible. ?Clearly mark and make sure that you can see: ?Any grab bars or handrails. ?First and last steps. ?Where the edge of each step is. ?Use tools that help you move around (mobility aids) if the

## 2021-06-01 ENCOUNTER — Ambulatory Visit (INDEPENDENT_AMBULATORY_CARE_PROVIDER_SITE_OTHER): Payer: Medicare Other | Admitting: Family Medicine

## 2021-06-01 ENCOUNTER — Encounter: Payer: Self-pay | Admitting: Family Medicine

## 2021-06-01 VITALS — BP 104/76 | HR 72 | Temp 98.4°F | Resp 18 | Ht 65.0 in | Wt 166.2 lb

## 2021-06-01 DIAGNOSIS — G4733 Obstructive sleep apnea (adult) (pediatric): Secondary | ICD-10-CM

## 2021-06-01 DIAGNOSIS — E785 Hyperlipidemia, unspecified: Secondary | ICD-10-CM | POA: Diagnosis not present

## 2021-06-01 DIAGNOSIS — I2510 Atherosclerotic heart disease of native coronary artery without angina pectoris: Secondary | ICD-10-CM | POA: Diagnosis not present

## 2021-06-01 DIAGNOSIS — R443 Hallucinations, unspecified: Secondary | ICD-10-CM | POA: Diagnosis not present

## 2021-06-01 MED ORDER — REPATHA 140 MG/ML ~~LOC~~ SOSY: PREFILLED_SYRINGE | SUBCUTANEOUS | Status: DC

## 2021-06-01 NOTE — Assessment & Plan Note (Signed)
prob due to osa  ?New sleep study to be done  ?Hold off bzd  ?

## 2021-06-01 NOTE — Assessment & Plan Note (Signed)
New sleep study ordered  ?

## 2021-06-01 NOTE — Patient Instructions (Signed)

## 2021-06-01 NOTE — Assessment & Plan Note (Signed)
Cardiology has started repatha ?F/u cardiology in June ?

## 2021-06-01 NOTE — Progress Notes (Signed)
? ?Subjective:  ? ?By signing my name below, I, [name], attest that this documentation has been prepared under the direction and in the presence of Ann Held, DO  06/01/2021 ?  ? ? Patient ID: Kelsey Church, female    DOB: [birth year], 71 y.o.   MRN: [id number] ? ?Chief Complaint  ?Patient presents with  ? Hyperlipidemia  ? Follow-up  ? ? ?Hyperlipidemia ?Pertinent negatives include no chest pain or shortness of breath.  ?Patient is in today for a follow up visit.  ? ?She reports her CPAP machine started malfunctioning last month and she was tested and fitted for a new machine by her pulmonologist. She found the machine had to much condensation around her nose and she had mucous running while using it. She was given a new machine and found it was an improvement to her last machine but she continues feeling like there is too much condensation around her nose. She is not wearing a CPAP machine at this time and found her sleep has worsened. She was recommended another CPAP machine but her insurance will not cover the machine. She has spoken to her pulmonologist about an inspire machine but was told she would have to go through more exams before her insurance would cover it. Her pulmonologist was also considering her a BiPAP machine.  ?Her pulmonologist thinks her hallucinations are due to her CPAP machine. She continues having hallucinations at night. She is considering seeing a neurologist to manage her hallucinations.  ?She is not taking any statin medication since January 2023. Her cholesterol was elevated during her last lab work. She recently started taking 140 mg/ml repatha and reports no new issues so far.  ?Lab Results  ?Component Value Date  ? CHOL 232 (H) 03/02/2021  ? HDL 53.70 03/02/2021  ? LDLCALC 149 (H) 03/02/2021  ? TRIG 147.0 03/02/2021  ? CHOLHDL 4 03/02/2021  ? ? ?Past Medical History:  ?Diagnosis Date  ? Arthritis   ? PAIN AND OA LEFT KNEE; S/P RIGHT TOTAL KNEE ARTHROPLASTY  06/10/13  ? Cancer [facility])   ? melanoma in 2 sights  ? Complication of anesthesia   ? "chills every evening at sundown x 2 weeks"- no fever  ? Depression   ? Dysphagia   ? History of kidney cancer   ? History of shingles   ? NO RESIDUAL PROBLEMS  ? Hypertension   ? Pain   ? LOWER BACK PAIN  ? Sleep apnea   ? CPAP  ? ? ?Past Surgical History:  ?Procedure Laterality Date  ? APPENDECTOMY  1958  ? COLONOSCOPY    ? COLONOSCOPY W/ POLYPECTOMY    ? 7 polyps  ? ESOPHAGEAL MANOMETRY N/A 03/17/2021  ? Procedure: ESOPHAGEAL MANOMETRY (EM);  Surgeon: Jerene Bears, MD;  Location: [facility];  Service: Gastroenterology;  Laterality: N/A;  ? FOOT SURGERY Right   ? removal plantar wart  ? GALLBLADDER SURGERY  02/14/1990  ? INJECTION KNEE  09/2011  ? right   ? KNEE ARTHROSCOPY  1996  ? LEFT HEART CATH AND CORONARY ANGIOGRAPHY N/A 07/11/2019  ? Procedure: LEFT HEART CATH AND CORONARY ANGIOGRAPHY;  Surgeon: Belva Crome, MD;  Location: [place] [facility];  Service: Cardiovascular;  Laterality: N/A;  ? [place]  ? right chin, back  ? NEPHRECTOMY Left 2022  ? POLYPECTOMY    ? SALPINGOOPHORECTOMY  2009  ? Right  ? TOTAL KNEE ARTHROPLASTY Right 06/10/2013  ? Procedure: RIGHT TOTAL KNEE  ARTHROPLASTY;  Surgeon: [name], MD;  Location: [facility];  Service: Orthopedics;  Laterality: Right;  ? TOTAL KNEE ARTHROPLASTY Left 12/16/2013  ? Procedure: LEFT TOTAL KNEE ARTHROPLASTY;  Surgeon: [name], MD;  Location: [facility];  Service: Orthopedics;  Laterality: Left;  ? ? ?Family History  ?Problem Relation Age of Onset  ? Heart disease Father   ? Heart attack Father 14  ?     59  ? Hypertension Father   ? Osteoporosis Mother   ? Breast cancer Mother   ? Cancer Maternal Grandmother   ?     Breast and Uterine Cancer  ? Breast cancer Maternal Grandmother   ? Thyroid cancer Maternal Grandmother   ? Hypertension Sister   ? Diabetes Sister   ? Colon polyps Sister   ? Colon cancer Neg Hx   ? Rectal cancer Neg Hx   ? Stomach cancer  Neg Hx   ? Esophageal cancer Neg Hx   ? Allergic rhinitis Neg Hx   ? Asthma Neg Hx   ? Eczema Neg Hx   ? Urticaria Neg Hx   ? ? ?Social History  ? ?Socioeconomic History  ? Marital status: Widowed  ?  Spouse name: Not on file  ? Number of children: 1  ? Years of education: Not on file  ? Highest education level: Not on file  ?Occupational History  ? Occupation: CMA  ?  Employer: [employer]  ?Tobacco Use  ? Smoking status: Never  ? Smokeless tobacco: Never  ?Vaping Use  ? Vaping Use: Never used  ?Substance and Sexual Activity  ? Alcohol use: No  ?  Alcohol/week: 0.0 standard drinks  ? Drug use: No  ? Sexual activity: Not Currently  ?  Comment: 1st intercourse- 18, partners- 1,   ?Other Topics Concern  ? Not on file  ?Social History Narrative  ? Lives alone.  Widow.  One son.   ? Caffienated drinks-no  ? Seat belt use often-yes  ? Regular Exercise-yes  ? Smoke alarm in the home-yes  ? Firearms/guns in the home-no  ? History of physical abuse-no  ?   ?   ?   ? ?Social Determinants of Health  ? ?Financial Resource Strain: Low Risk   ? Difficulty of Paying Living Expenses: Not hard at all  ?Food Insecurity: No Food Insecurity  ? Worried About Charity fundraiser in the Last Year: Never true  ? Ran Out of Food in the Last Year: Never true  ?Transportation Needs: No Transportation Needs  ? Lack of Transportation (Medical): No  ? Lack of Transportation (Non-Medical): No  ?Physical Activity: Sufficiently Active  ? Days of Exercise per Week: 4 days  ? Minutes of Exercise per Session: 60 min  ?Stress: No Stress Concern Present  ? Feeling of Stress : Only a little  ?Social Connections: Moderately Integrated  ? Frequency of Communication with Friends and Family: More than three times a week  ? Frequency of Social Gatherings with Friends and Family: More than three times a week  ? Attends Religious Services: More than 4 times per year  ? Active Member of Clubs or Organizations: Yes  ? Attends Archivist Meetings: More  than 4 times per year  ? Marital Status: Widowed  ?Intimate Partner Violence: Not At Risk  ? Fear of Current or Ex-Partner: No  ? Emotionally Abused: No  ? Physically Abused: No  ? Sexually Abused: No  ? ? ?Outpatient Medications  Prior to Visit  ?Medication Sig Dispense Refill  ? acetaminophen (TYLENOL) 650 MG CR tablet Take 1,300 mg by mouth every 8 (eight) hours as needed for pain.     ? aspirin EC 81 MG tablet Take 1 tablet (81 mg total) by mouth daily. 90 tablet 3  ? cetirizine (ZYRTEC) 10 MG tablet Take 10 mg by mouth daily as needed for allergies.    ? citalopram (CELEXA) 20 MG tablet TAKE 1 TABLET AT BEDTIME 90 tablet 1  ? Evolocumab (REPATHA SURECLICK) 161 MG/ML SOAJ Inject 1 mL into the skin every 14 (fourteen) days. 6 mL 3  ? metoprolol succinate (TOPROL-XL) 25 MG 24 hr tablet Take 2 tablets (50 mg total) by mouth in the morning AND 1 tablet (25 mg total) every evening. 270 tablet 3  ? nitroGLYCERIN (NITROSTAT) 0.4 MG SL tablet Place 1 tablet (0.4 mg total) under the tongue every 5 (five) minutes as needed for chest pain. 25 tablet 3  ? pantoprazole (PROTONIX) 40 MG tablet Take 1 tablet (40 mg total) by mouth 2 (two) times daily before a meal. 60 tablet 3  ? ranolazine (RANEXA) 500 MG 12 hr tablet Take 1 tablet (500 mg total) by mouth 2 (two) times daily. 180 tablet 3  ? ?No facility-administered medications prior to visit.  ? ? ?Allergies  ?Allergen Reactions  ? Atorvastatin   ? Rosuvastatin   ?  '40mg'$ --Creatinine elevation  ? ? ?Review of Systems  ?Constitutional:  Negative for fever and malaise/fatigue.  ?HENT:  Negative for congestion.   ?Eyes:  Negative for blurred vision.  ?Respiratory:  Negative for cough and shortness of breath.   ?Cardiovascular:  Negative for chest pain, palpitations and leg swelling.  ?Gastrointestinal:  Negative for vomiting.  ?Musculoskeletal:  Negative for back pain.  ?Skin:  Negative for rash.  ?Neurological:  Negative for loss of consciousness and headaches.   ?Psychiatric/Behavioral:  Positive for hallucinations (occasionally at night).   ?     (+)difficulty sleeping  ? ?   ?Objective:  ?  ?Physical Exam ?Vitals and nursing note reviewed.  ?Constitutional:   ?   General: She

## 2021-06-08 ENCOUNTER — Ambulatory Visit: Payer: Medicare Other | Admitting: Pulmonary Disease

## 2021-06-08 ENCOUNTER — Encounter: Payer: Self-pay | Admitting: Obstetrics & Gynecology

## 2021-06-09 ENCOUNTER — Ambulatory Visit (HOSPITAL_BASED_OUTPATIENT_CLINIC_OR_DEPARTMENT_OTHER): Payer: Medicare Other | Attending: Adult Health | Admitting: Pulmonary Disease

## 2021-06-09 ENCOUNTER — Ambulatory Visit: Payer: Medicare Other | Admitting: Internal Medicine

## 2021-06-09 DIAGNOSIS — G4733 Obstructive sleep apnea (adult) (pediatric): Secondary | ICD-10-CM | POA: Insufficient documentation

## 2021-06-14 ENCOUNTER — Telehealth: Payer: Self-pay | Admitting: Pulmonary Disease

## 2021-06-14 DIAGNOSIS — G4733 Obstructive sleep apnea (adult) (pediatric): Secondary | ICD-10-CM

## 2021-06-14 NOTE — Procedures (Signed)
Patient Name: Kelsey Church, Kelsey Church ?Study Date: 06/09/2021 ?Gender: Female ?D.O.B: 1950/03/01 ?Age (years): 57 ?Referring Provider: Rexene Edison ?Height (inches): 66 ?Interpreting Physician: Kara Mead MD, ABSM ?Weight (lbs): 164 ?RPSGT: Jorge Ny ?BMI: 26 ?MRN: [id number] ?Neck Size: 13.50 ?<br> <br> ?CLINICAL INFORMATION ?The patient is referred for a CPAP titration to treat sleep apnea. ?She had persistent events on CPAP download ? ?2013 PSG  moderate OSA with AHi 22/h & lowest desatn to 87% corrected by CPAP 9 cm with swift nasal pillows ? ?SLEEP STUDY TECHNIQUE ?As per the AASM Manual for the Scoring of Sleep and Associated Events v2.3 (April 2016) with a hypopnea requiring 4% desaturations. ? ?The channels recorded and monitored were frontal, central and occipital EEG, electrooculogram (EOG), submentalis EMG (chin), nasal and oral airflow, thoracic and abdominal wall motion, anterior tibialis EMG, snore microphone, electrocardiogram, and pulse oximetry. Continuous positive airway pressure (CPAP) was initiated at the beginning of the study and titrated to treat sleep-disordered breathing. ? ?MEDICATIONS ?Medications self-administered by patient taken the night of the study : N/A ? ?RESPIRATORY PARAMETERS ?Optimal PAP Pressure (cm): 9 AHI at Optimal Pressure (/hr): 0 ?Overall Minimal O2 (%): 91.0 Supine % at Optimal Pressure (%): 78 ?Minimal O2 at Optimal Pressure (%): 91.0  ? ?SLEEP ARCHITECTURE ?The study was initiated at 10:24:30 PM and ended at 4:40:04 AM. ? ?Sleep onset time was 21.9 minutes and the sleep efficiency was 88.3%%. The total sleep time was 331.5 minutes. ? ?The patient spent 4.8%% of the night in stage N1 sleep, 54.3%% in stage N2 sleep, 14.6%% in stage N3 and 26.2% in REM.Stage REM latency was 170.0 minutes ? ?Wake after sleep onset was 22.2. Alpha intrusion was absent. Supine sleep was 39.06%. ? ?CARDIAC DATA ?The 2 lead EKG demonstrated sinus rhythm. The mean heart rate was 59.4 beats per  minute. Other EKG findings include: None. ? ?LEG MOVEMENT DATA ?The total Periodic Limb Movements of Sleep (PLMS) were 0. The PLMS index was 0.0. A PLMS index of <15 is considered normal in adults. ? ?IMPRESSIONS ?- An optimal PAP pressure of 9 cm was selected for this patient based on the available study data. ?- Central sleep apnea was not noted during this titration (CAI = 0.2/h). ?- Significant oxygen desaturations were not observed during this titration (min O2 = 91.0%). ?- The patient snored with soft snoring volume during this titration study. ?- No cardiac abnormalities were observed during this study. ?- Clinically significant periodic limb movements were not noted during this study. Arousals associated with LMs were significant 6/h ? ? ?DIAGNOSIS ?- Obstructive Sleep Apnea (G47.33) ? ? ?RECOMMENDATIONS ?- CPAP 9 cm with small airfit F20 full face mask ?- Avoid alcohol, sedatives and other CNS depressants that may worsen sleep apnea and disrupt normal sleep architecture. ?- Sleep hygiene should be reviewed to assess factors that may improve sleep quality. ?- Weight management and regular exercise should be initiated or continued. ?- Return to [facility] Center for re-evaluation after 4 weeks of therapy ? ? ?Kara Mead MD ?Board Certified in Sleep medicine ? ?

## 2021-06-14 NOTE — Telephone Encounter (Signed)
CPAP 9 cm required during titration study ?Please change CPAP to this level ?

## 2021-06-15 ENCOUNTER — Ambulatory Visit (INDEPENDENT_AMBULATORY_CARE_PROVIDER_SITE_OTHER): Payer: Medicare Other | Admitting: Obstetrics & Gynecology

## 2021-06-15 ENCOUNTER — Encounter: Payer: Self-pay | Admitting: Obstetrics & Gynecology

## 2021-06-15 ENCOUNTER — Other Ambulatory Visit (HOSPITAL_COMMUNITY)
Admission: RE | Admit: 2021-06-15 | Discharge: 2021-06-15 | Disposition: A | Discharge status: Home / Self Care | Payer: Medicare Other | Source: Ambulatory Visit | Attending: Obstetrics & Gynecology | Admitting: Obstetrics & Gynecology

## 2021-06-15 VITALS — BP 104/68 | HR 68 | Resp 16 | Ht 63.75 in | Wt 165.0 lb

## 2021-06-15 DIAGNOSIS — Z01419 Encounter for gynecological examination (general) (routine) without abnormal findings: Secondary | ICD-10-CM | POA: Diagnosis not present

## 2021-06-15 DIAGNOSIS — Z85528 Personal history of other malignant neoplasm of kidney: Secondary | ICD-10-CM

## 2021-06-15 DIAGNOSIS — M8589 Other specified disorders of bone density and structure, multiple sites: Secondary | ICD-10-CM

## 2021-06-15 DIAGNOSIS — Z780 Asymptomatic menopausal state: Secondary | ICD-10-CM | POA: Diagnosis not present

## 2021-06-15 NOTE — Telephone Encounter (Signed)
Okay thanks for update .  ?Has ov in July can do download at [facility] with recent changes  ?

## 2021-06-15 NOTE — Telephone Encounter (Signed)
I called and spoke with the pt and notified of results/recs  ?She verbalized understanding  ?Referral placed for settings change ?

## 2021-06-15 NOTE — Progress Notes (Signed)
? ? ?Kelsey Church 09-22-1950 [id number] ? ? ?History:    71 y.o. G2P1A1L1 Widowed ?  ?RP:  Established patient presenting for annual gyn exam  ?  ?HPI: Postmenopause well on no HRT, but still having mild hot flushes and night sweats.  No postmenopausal bleeding.  No pelvic pain.  Abstinent.  Pap 01/2017 Negative.  Pap reflex today. Asymptomatic small Uterine Fibroids documented by Pelvic US 02/2018.  Endometrial line was thin.  Rt Ovary surgically absent.  Lt ovary normal.   Urine and bowel movements normal. Breasts normal.  Mammo in [place] 11/2019.  Lt breast in 06/2020.  Will repeat screening mammo now.  Body mass index stable at 28.54.  Walking her dogs.   Health labs with family physician.  Last BD 02/2019 Osteopenia, will schedule here now.  [place] 05/2020.  Had Left kidney Cancer.   ? ? ?Past medical history,surgical history, family history and social history were all reviewed and documented in the EPIC chart. ? ?Gynecologic History ?No LMP recorded. Patient is postmenopausal. ? ?Obstetric History ?OB History  ?Gravida Para Term Preterm AB Living  ?'2 1 1 '$ 0 0 1  ?SAB IAB Ectopic Multiple Live Births  ?    0   1  ?  ?# Outcome Date GA Lbr Len/2nd Weight Sex Delivery Anes PTL Lv  ?2 Term           ?1 Gravida         FD  ? ? ? ?ROS: A ROS was performed and pertinent positives and negatives are included in the history. ? GENERAL: No fevers or chills. HEENT: No change in vision, no earache, sore throat or sinus congestion. NECK: No pain or stiffness. CARDIOVASCULAR: No chest pain or pressure. No palpitations. PULMONARY: No shortness of breath, cough or wheeze. GASTROINTESTINAL: No abdominal pain, nausea, vomiting or diarrhea, melena or bright red blood per rectum. GENITOURINARY: No urinary frequency, urgency, hesitancy or dysuria. MUSCULOSKELETAL: No joint or muscle pain, no back pain, no recent trauma. DERMATOLOGIC: No rash, no itching, no lesions. ENDOCRINE: No polyuria, polydipsia, no heat or cold intolerance. No  recent change in weight. HEMATOLOGICAL: No anemia or easy bruising or bleeding. NEUROLOGIC: No headache, seizures, numbness, tingling or weakness. PSYCHIATRIC: No depression, no loss of interest in normal activity or change in sleep pattern.  ?  ? ?Exam: ? ? ?BP 104/68   Pulse 68   Resp 16   Ht 5' 3.75" (1.619 m)   Wt 165 lb (74.8 kg)   BMI 28.54 kg/m?  ? ?Body mass index is 28.54 kg/m?. ? ?General appearance : Well developed well nourished female. No acute distress ?HEENT: Eyes: no retinal hemorrhage or exudates,  Neck supple, trachea midline, no carotid bruits, no thyroidmegaly ?Lungs: Clear to auscultation, no rhonchi or wheezes, or rib retractions  ?Heart: Regular rate and rhythm, no murmurs or gallops ?Breast:Examined in sitting and supine position were symmetrical in appearance, no palpable masses or tenderness,  no skin retraction, no nipple inversion, no nipple discharge, no skin discoloration, no axillary or supraclavicular lymphadenopathy ?Abdomen: no palpable masses or tenderness, no rebound or guarding ?Extremities: no edema or skin discoloration or tenderness ? ?Pelvic: Vulva: Normal ?            Vagina: No gross lesions or discharge ? Cervix: No gross lesions or discharge.  Pap reflex done. ? Uterus  AV, normal size, shape and consistency, non-tender and mobile ? Adnexa  Without masses or tenderness ? Anus: Normal ? ? ?  Assessment/Plan:  71 y.o. female for annual exam  ? ?1. Encounter for routine gynecological examination with Papanicolaou smear of cervix ?Postmenopause well on no HRT, but still having mild hot flushes and night sweats.  No postmenopausal bleeding.  No pelvic pain.  Abstinent.  Pap 01/2017 Negative.  Pap reflex today. Asymptomatic small Uterine Fibroids documented by Pelvic US 02/2018.  Endometrial line was thin.  Rt Ovary surgically absent.  Lt ovary normal.   Urine and bowel movements normal. Breasts normal.  Mammo in [place] 11/2019.  Lt breast in 06/2020.  Will repeat screening  mammo now.  Body mass index stable at 28.54.  Walking her dogs.   Health labs with family physician.  Last BD 02/2019 Osteopenia, will schedule here now.  [place] 05/2020.  Had Left kidney Cancer.   ?- Cytology - PAP( [place]) ? ?2. Postmenopause ?Continues to have Menopausal Sxs.  Will try PhytoEstrogens. ? ?3. Osteopenia of multiple sites ?Osteopenia at multiple sites in 02/2019.  Repeat BD here now.  Vit D, Ca++ 1.5 g/d total, regular weight bearing physical activities. ?- DG Bone Density; Future ? ?4. History of kidney cancer ? ?Other orders ?- Docusate Calcium (STOOL SOFTENER PO); Take by mouth.  ? ?Princess Bruins MD, 11:19 AM 06/15/2021 ? ?  ?

## 2021-06-16 LAB — CYTOLOGY - PAP: Diagnosis: NEGATIVE

## 2021-06-16 NOTE — Telephone Encounter (Signed)
Mychart message sent by pt: ?Kelsey Church "[name]"  P [facility] (supporting Church, Fonnie Mu, NP)  ? ?Thank you for notifying me concerning the setting my CPAP machine needs to be set on yesterday via phone by a nurse. ?I called today to [facility] to ask questions concerning my mask and was told they do not have the order yet. Could someone check on this. ?Also could someone call me concerning the results of the sleep study test. I have seen results in My Chart but do not understand any of it.  ?I do not have an appointment to see Dr. Elsworth Soho till July. If there is anyway I could see Kelsey Church sooner to go over the sleep test with me or call me. ?Anxious and discouraged that mask fitting is not helpful. ?Thank You  ?Kelsey Church ?[phone] ?Dec 13, 1950 ? ? ? ? ?Order was placed 5/2 for pt's settings to be changed. PCCs, please advise if you have received an update on if this has been sent/received by [facility]. ? ?Also, Kelsey please advise if you are okay reviewing pt's sleep study. ?

## 2021-06-21 NOTE — Telephone Encounter (Signed)
Spoke with [facility] who stated pressures had been changed today. [facility] also stated pt was seen in their office for mask fitting on 06/03/21 and recived a new C-Pap in March 2023. Pt notified. Nothing further needed at this time.  ?

## 2021-07-02 ENCOUNTER — Encounter (HOSPITAL_BASED_OUTPATIENT_CLINIC_OR_DEPARTMENT_OTHER): Payer: Medicare Other | Admitting: Pulmonary Disease

## 2021-07-05 ENCOUNTER — Telehealth: Payer: Self-pay | Admitting: Internal Medicine

## 2021-07-05 NOTE — Telephone Encounter (Signed)
Left detailed message on pt identified voice mail that she can try a miralax purge by mixing 7 doses of miralax with 32 oz gatorade and then drink 1 cup every 4mn until gone.

## 2021-07-05 NOTE — Telephone Encounter (Signed)
Patient called states she has been having a lot of constipation and has done OTC medications nothing is helping her.

## 2021-07-14 ENCOUNTER — Ambulatory Visit
Admission: RE | Admit: 2021-07-14 | Discharge: 2021-07-14 | Disposition: A | Discharge status: Home / Self Care | Payer: Medicare Other | Source: Ambulatory Visit | Attending: Family Medicine | Admitting: Family Medicine

## 2021-07-14 ENCOUNTER — Ambulatory Visit (INDEPENDENT_AMBULATORY_CARE_PROVIDER_SITE_OTHER): Payer: Medicare Other

## 2021-07-14 ENCOUNTER — Ambulatory Visit: Payer: Medicare Other

## 2021-07-14 ENCOUNTER — Other Ambulatory Visit: Payer: Self-pay | Admitting: Obstetrics & Gynecology

## 2021-07-14 DIAGNOSIS — Z1231 Encounter for screening mammogram for malignant neoplasm of breast: Secondary | ICD-10-CM | POA: Diagnosis not present

## 2021-07-14 DIAGNOSIS — Z780 Asymptomatic menopausal state: Secondary | ICD-10-CM

## 2021-07-14 DIAGNOSIS — M8589 Other specified disorders of bone density and structure, multiple sites: Secondary | ICD-10-CM

## 2021-07-14 DIAGNOSIS — Z13820 Encounter for screening for osteoporosis: Secondary | ICD-10-CM | POA: Diagnosis not present

## 2021-07-14 IMAGING — MG MM DIGITAL SCREENING BILAT W/ TOMO AND CAD
8 series · 8 of 24 positions shown · non-contrast
Comparison: Previous exam(s).

ACR Breast Density Category a: The breast tissue is almost entirely
fatty.

CLINICAL DATA: Screening.

EXAM:
DIGITAL SCREENING BILATERAL MAMMOGRAM WITH TOMOSYNTHESIS AND CAD
TECHNIQUE: Bilateral screening digital craniocaudal and mediolateral oblique
mammograms were obtained. Bilateral screening digital breast
tomosynthesis was performed. The images were evaluated with
computer-aided detection.

[R CC synth-2D]
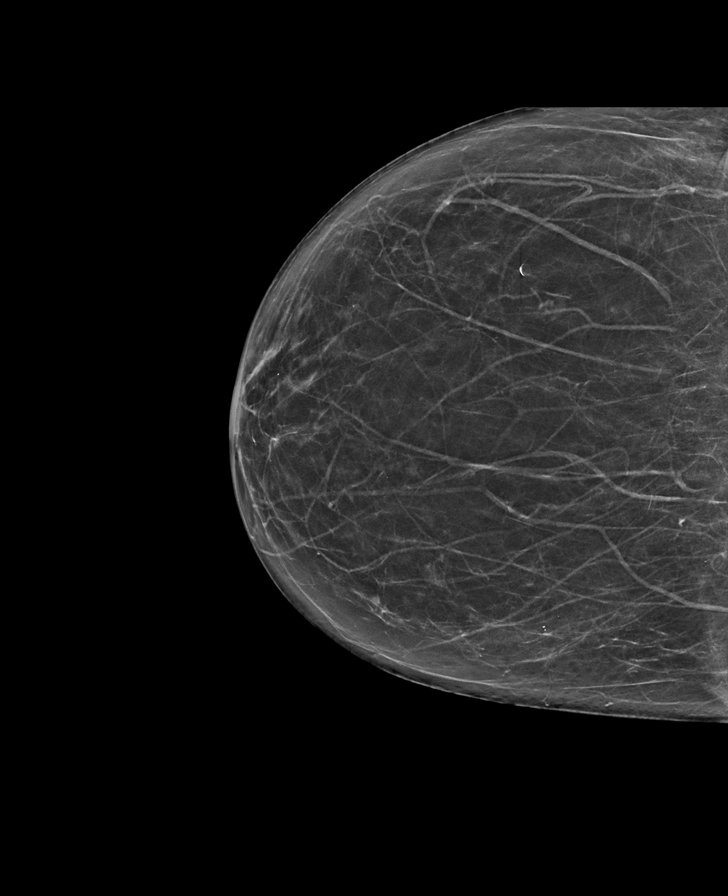

[R MLO synth-2D]
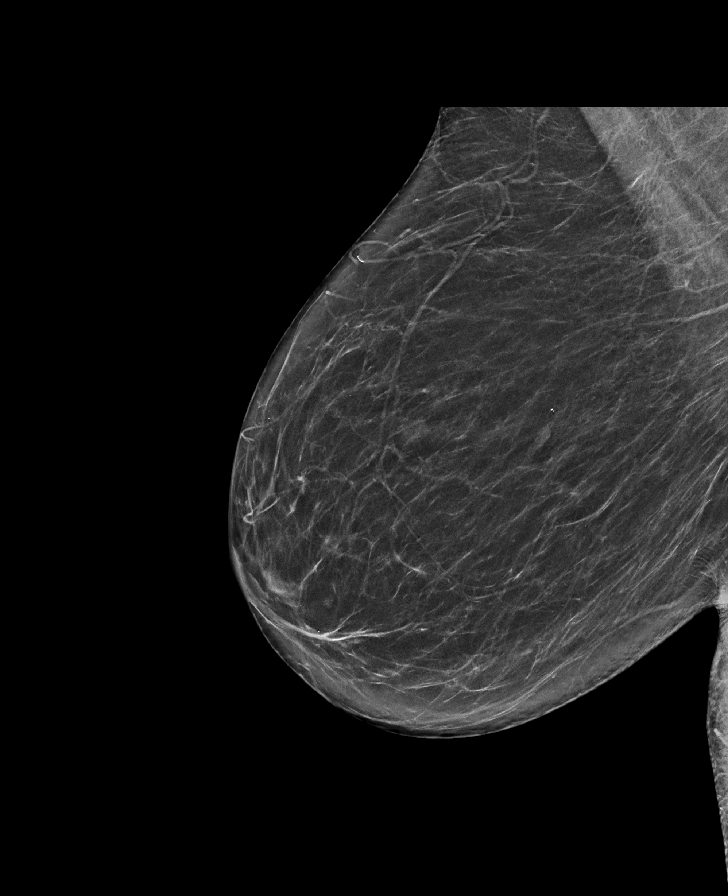

[L CC synth-2D]
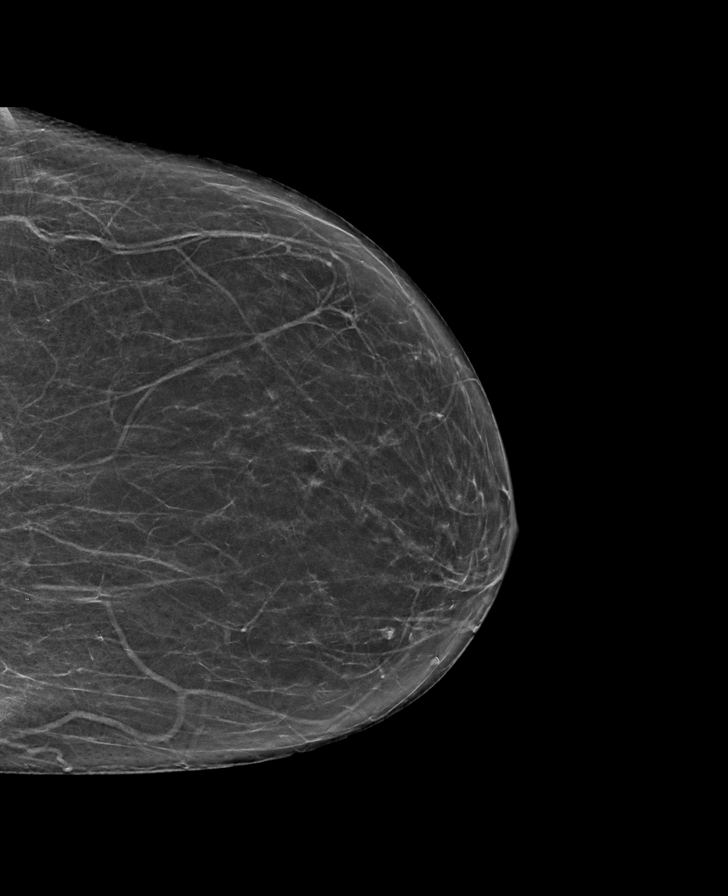

[L MLO synth-2D]
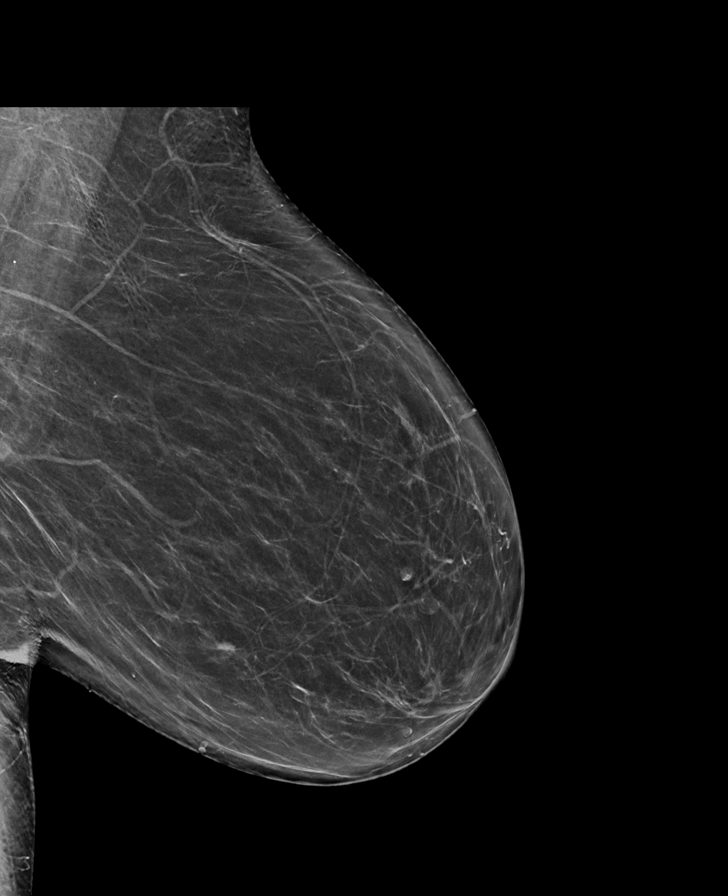

[L MLO tomo · tomo slice 36/71.0]
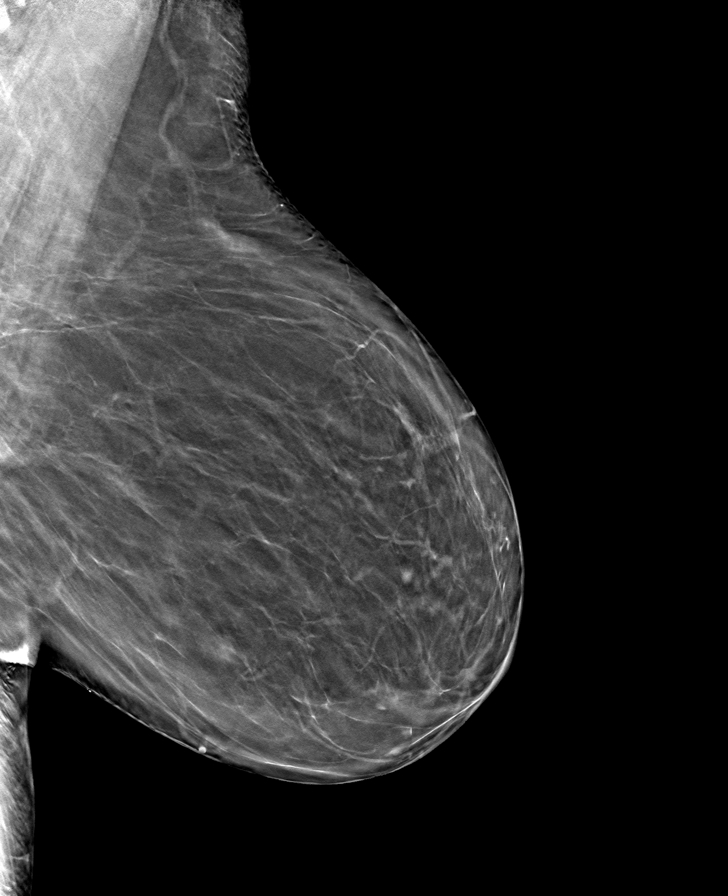

[R CC tomo · tomo slice 32/63.0]
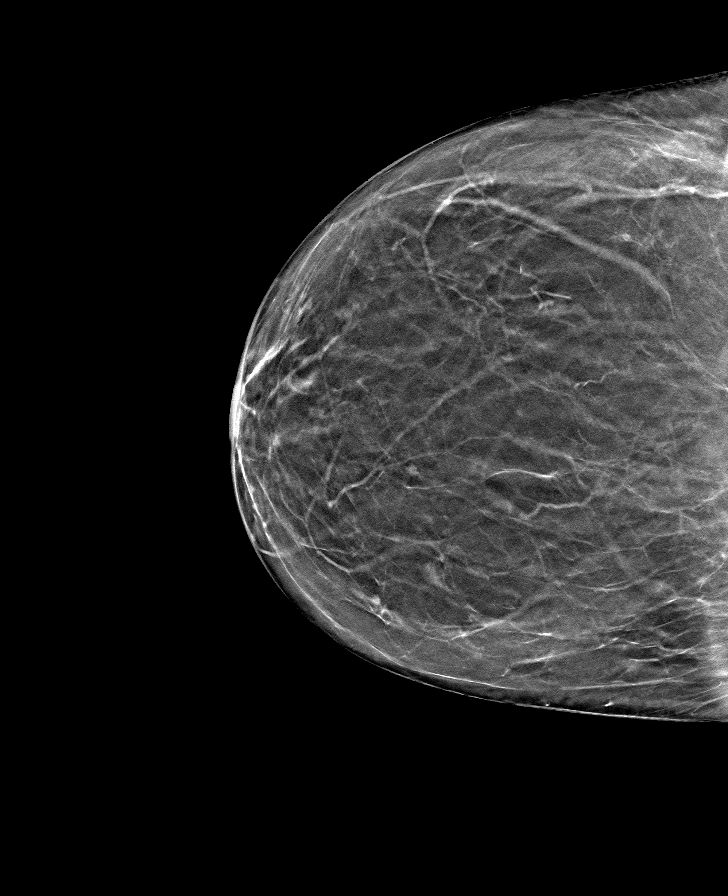

[R MLO tomo · tomo slice 37/72.0]
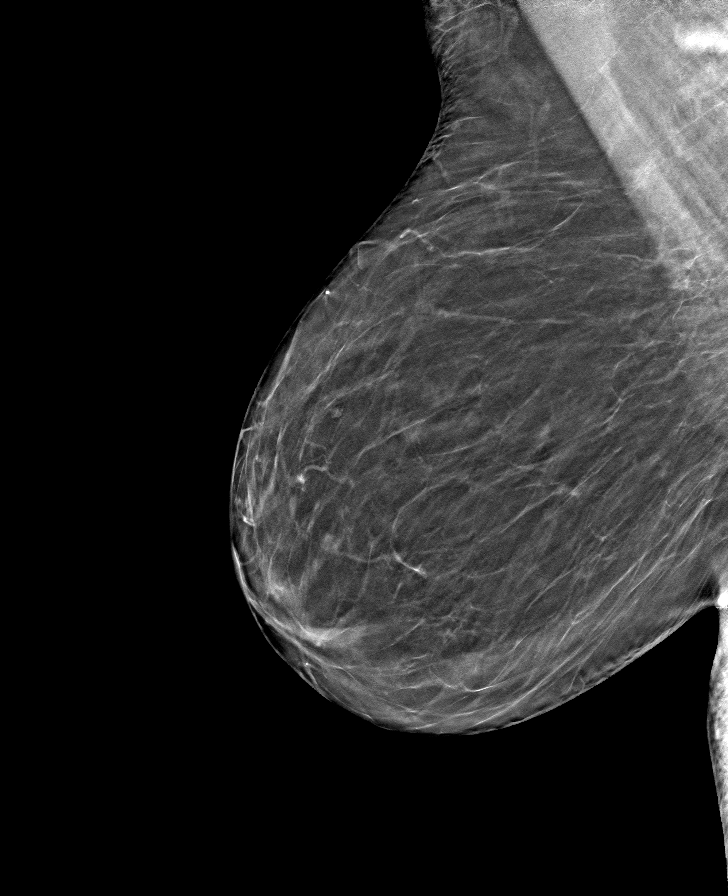

[L CC tomo · tomo slice 31/61.0]
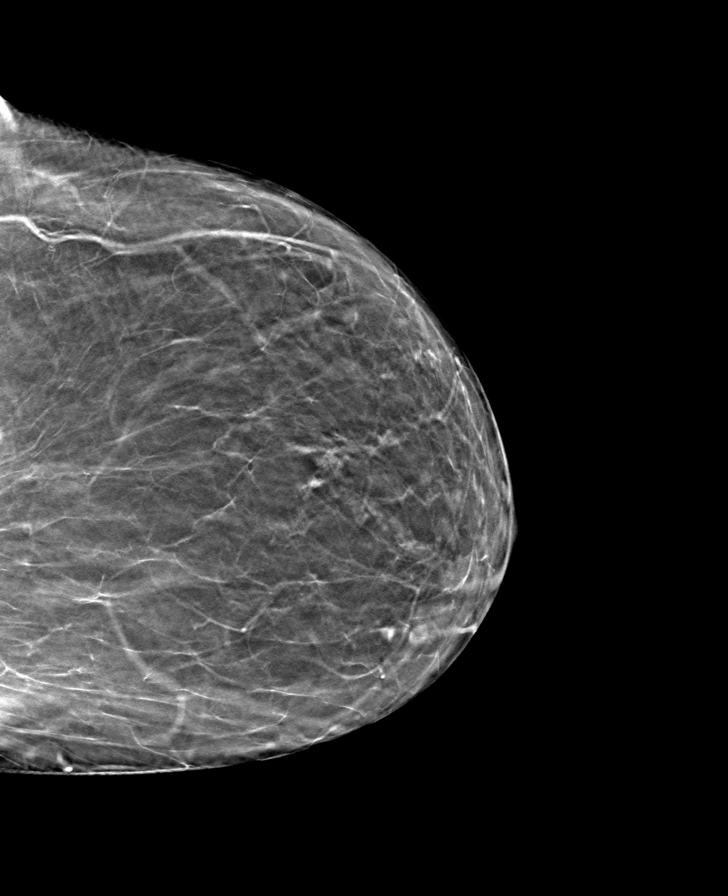

[8 of 24 positions shown; findings below may reference images not displayed]

FINDINGS: There are no findings suspicious for malignancy.
IMPRESSION: No mammographic evidence of malignancy. A result letter of this
screening mammogram will be mailed directly to the patient.

RECOMMENDATION:
Screening mammogram in one year. (Code:[8U])

BI-RADS CATEGORY  1: Negative.

## 2021-07-20 DIAGNOSIS — E785 Hyperlipidemia, unspecified: Secondary | ICD-10-CM | POA: Diagnosis not present

## 2021-07-20 DIAGNOSIS — N184 Chronic kidney disease, stage 4 (severe): Secondary | ICD-10-CM | POA: Diagnosis not present

## 2021-07-20 DIAGNOSIS — Z5181 Encounter for therapeutic drug level monitoring: Secondary | ICD-10-CM | POA: Diagnosis not present

## 2021-07-20 LAB — LIPID PANEL
Chol/HDL Ratio: 2.2 ratio (ref 0.0–4.4)
Cholesterol, Total: 138 mg/dL (ref 100–199)
HDL: 62 mg/dL (ref >39)
LDL Chol Calc (NIH): 58 mg/dL (ref 0–99)
Triglycerides: 98 mg/dL (ref 0–149)
VLDL Cholesterol Cal: 18 mg/dL (ref 5–40)

## 2021-07-26 ENCOUNTER — Encounter: Payer: Self-pay | Admitting: Cardiology

## 2021-07-27 ENCOUNTER — Ambulatory Visit (INDEPENDENT_AMBULATORY_CARE_PROVIDER_SITE_OTHER): Payer: Medicare Other | Admitting: Family Medicine

## 2021-07-27 ENCOUNTER — Encounter: Payer: Self-pay | Admitting: Family Medicine

## 2021-07-27 ENCOUNTER — Other Ambulatory Visit (HOSPITAL_BASED_OUTPATIENT_CLINIC_OR_DEPARTMENT_OTHER): Payer: Self-pay

## 2021-07-27 VITALS — BP 118/74 | HR 87 | Temp 99.7°F | Resp 18 | Ht 63.75 in | Wt 169.2 lb

## 2021-07-27 DIAGNOSIS — U071 COVID-19: Secondary | ICD-10-CM | POA: Diagnosis not present

## 2021-07-27 DIAGNOSIS — I2510 Atherosclerotic heart disease of native coronary artery without angina pectoris: Secondary | ICD-10-CM

## 2021-07-27 DIAGNOSIS — R051 Acute cough: Secondary | ICD-10-CM

## 2021-07-27 LAB — POC COVID19 BINAXNOW: SARS Coronavirus 2 Ag: POSITIVE — AB

## 2021-07-27 MED ORDER — PROMETHAZINE-DM 6.25-15 MG/5ML PO SYRP
5.0000 mL | ORAL_SOLUTION | Freq: Four times a day (QID) | ORAL | 0 refills | Status: DC | PRN
Start: 2021-07-27 — End: 2021-09-13
  Filled 2021-07-27: qty 118, 6d supply, fill #0

## 2021-07-27 MED ORDER — AZITHROMYCIN 250 MG PO TABS
ORAL_TABLET | ORAL | 0 refills | Status: DC
  Filled 2021-07-27: qty 6, 5d supply, fill #0

## 2021-07-27 NOTE — Progress Notes (Signed)
Subjective:   By signing my name below, I, [name], attest that this documentation has been prepared under the direction and in the presence of Ann Held, DO  07/27/2021    Patient ID: Kelsey Church, female    DOB: May 20, 1950, 71 y.o.   MRN: [id number]  Chief Complaint  Patient presents with   Cough    Pt states sxs started over the weekend while at the beach. Pt states sxs started out like allergies and used otc meds. No COVID test.     Cough Associated symptoms include headaches and myalgias (general body aches).   Patient is in today for a office visit.   She complains of cough, dizziness, headache , rhinorrhea and body aches since 07/24/2021. She was out traveling with her family and she came back yesterday. She tested positive for Covid-19 during her visit today.    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/10/13   Cancer ([place])    melanoma in 2 sights   Complication of anesthesia    "chills every evening at sundown x 2 weeks"- no fever   Depression    Dysphagia    History of kidney cancer    left   History of shingles    NO RESIDUAL PROBLEMS   Hypertension    Pain    LOWER BACK PAIN   Sleep apnea    CPAP    Past Surgical History:  Procedure Laterality Date   APPENDECTOMY  1958   COLONOSCOPY     COLONOSCOPY W/ POLYPECTOMY     7 polyps   ESOPHAGEAL MANOMETRY N/A 03/17/2021   Procedure: ESOPHAGEAL MANOMETRY (EM);  Surgeon: Jerene Bears, MD;  Location: [facility];  Service: Gastroenterology;  Laterality: N/A;   FOOT SURGERY Right    removal plantar wart   GALLBLADDER SURGERY  02/14/1990   INJECTION KNEE  09/2011   right    KNEE ARTHROSCOPY  1996   LEFT HEART CATH AND CORONARY ANGIOGRAPHY N/A 07/11/2019   Procedure: LEFT HEART CATH AND CORONARY ANGIOGRAPHY;  Surgeon: Belva Crome, MD;  Location: [facility];  Service: Cardiovascular;  Laterality: N/A;    MELANOMA EXCISION  1992   right chin, back   NEPHRECTOMY Left 2022   POLYPECTOMY     SALPINGOOPHORECTOMY  2009   Right   TOTAL KNEE ARTHROPLASTY Right 06/10/2013   Procedure: RIGHT TOTAL KNEE ARTHROPLASTY;  Surgeon: [name], MD;  Location: [facility];  Service: Orthopedics;  Laterality: Right;   TOTAL KNEE ARTHROPLASTY Left 12/16/2013   Procedure: LEFT TOTAL KNEE ARTHROPLASTY;  Surgeon: [name], MD;  Location: [facility];  Service: Orthopedics;  Laterality: Left;    Family History  Problem Relation Age of Onset   Osteoporosis Mother    Breast cancer Mother    Heart disease Father    Heart attack Father 45       53   Hypertension Father    Hypertension Sister    Diabetes Sister    Colon polyps Sister    Cancer Maternal Grandmother        Breast and Uterine Cancer   Breast cancer Maternal Grandmother    Thyroid cancer Maternal Grandmother     Social History   Socioeconomic History   Marital status: Widowed    Spouse name: Not on file   Number of children: 1   Years of  education: Not on file   Highest education level: Not on file  Occupational History   Occupation: CMA    Employer: [employer]  Tobacco Use   Smoking status: Never   Smokeless tobacco: Never  Vaping Use   Vaping Use: Never used  Substance and Sexual Activity   Alcohol use: No    Alcohol/week: 0.0 standard drinks of alcohol   Drug use: No   Sexual activity: Not Currently    Partners: Male    Birth control/protection: Post-menopausal    Comment: 1st intercourse- 18, partners- 1,   Other Topics Concern   Not on file  Social History Narrative   Lives alone.  Widow.  One son.    Caffienated drinks-no   Seat belt use often-yes   Regular Exercise-yes   Smoke alarm in the home-yes   Firearms/guns in the home-no   History of physical abuse-no            Social Determinants of Health   Financial Resource Strain: Low Risk  (05/31/2021)   Overall Financial Resource Strain (CARDIA)     Difficulty of Paying Living Expenses: Not hard at all  Food Insecurity: No Food Insecurity (05/31/2021)   Hunger Vital Sign    Worried About Running Out of Food in the Last Year: Never true    Ran Out of Food in the Last Year: Never true  Transportation Needs: No Transportation Needs (05/31/2021)   PRAPARE - Hydrologist (Medical): No    Lack of Transportation (Non-Medical): No  Physical Activity: Sufficiently Active (05/31/2021)   Exercise Vital Sign    Days of Exercise per Week: 4 days    Minutes of Exercise per Session: 60 min  Stress: No Stress Concern Present (05/31/2021)   [place]    Feeling of Stress : Only a little  Social Connections: Moderately Integrated (05/31/2021)   Social Connection and Isolation Panel [NHANES]    Frequency of Communication with Friends and Family: More than three times a week    Frequency of Social Gatherings with Friends and Family: More than three times a week    Attends Religious Services: More than 4 times per year    Active Member of [employer] or Organizations: Yes    Attends Archivist Meetings: More than 4 times per year    Marital Status: Widowed  Intimate Partner Violence: Not At Risk (05/31/2021)   Humiliation, Afraid, Rape, and Kick questionnaire    Fear of Current or Ex-Partner: No    Emotionally Abused: No    Physically Abused: No    Sexually Abused: No    Outpatient Medications Prior to Visit  Medication Sig Dispense Refill   acetaminophen (TYLENOL) 650 MG CR tablet Take 1,300 mg by mouth every 8 (eight) hours as needed for pain.      aspirin EC 81 MG tablet Take 1 tablet (81 mg total) by mouth daily. 90 tablet 3   cetirizine (ZYRTEC) 10 MG tablet Take 10 mg by mouth daily as needed for allergies.     citalopram (CELEXA) 20 MG tablet TAKE 1 TABLET AT BEDTIME 90 tablet 1   Docusate Calcium (STOOL SOFTENER PO) Take by mouth.      Evolocumab (REPATHA SURECLICK) 680 MG/ML SOAJ Inject 1 mL into the skin every 14 (fourteen) days. 6 mL 3   metoprolol succinate (TOPROL-XL) 25 MG 24 hr tablet Take 2 tablets (50 mg total) by mouth in  the morning AND 1 tablet (25 mg total) every evening. 270 tablet 3   pantoprazole (PROTONIX) 40 MG tablet Take 1 tablet (40 mg total) by mouth 2 (two) times daily before a meal. 60 tablet 3   ranolazine (RANEXA) 500 MG 12 hr tablet Take 1 tablet (500 mg total) by mouth 2 (two) times daily. 180 tablet 3   nitroGLYCERIN (NITROSTAT) 0.4 MG SL tablet Place 1 tablet (0.4 mg total) under the tongue every 5 (five) minutes as needed for chest pain. (Patient not taking: Reported on 06/15/2021) 25 tablet 3   No facility-administered medications prior to visit.    Allergies  Allergen Reactions   Atorvastatin    Rosuvastatin     '40mg'$ --Creatinine elevation    Review of Systems  HENT:         (+)rhinorrhea  Respiratory:  Positive for cough.   Musculoskeletal:  Positive for myalgias (general body aches).  Neurological:  Positive for dizziness and headaches.       Objective:    Physical Exam Constitutional:      General: She is not in acute distress.    Appearance: Normal appearance. She is not ill-appearing.  HENT:     Head: Normocephalic and atraumatic.     Right Ear: External ear normal.     Left Ear: External ear normal.  Eyes:     Extraocular Movements: Extraocular movements intact.     Pupils: Pupils are equal, round, and reactive to light.  Cardiovascular:     Rate and Rhythm: Normal rate and regular rhythm.     Heart sounds: Normal heart sounds. No murmur heard.    No gallop.  Pulmonary:     Effort: Pulmonary effort is normal. No respiratory distress.     Breath sounds: Normal breath sounds. No wheezing or rales.     Comments: Tested positive for Covid-19 in clinic Skin:    General: Skin is warm and dry.  Neurological:     Mental Status: She is alert and oriented to person, place,  and time.  Psychiatric:        Judgment: Judgment normal.     BP 118/74 (BP Location: Left Arm, Patient Position: Sitting, Cuff Size: Normal)   Pulse 87   Temp 99.7 F (37.6 C) (Oral)   Resp 18   Ht 5' 3.75" (1.619 m)   Wt 169 lb 3.2 oz (76.7 kg)   SpO2 95%   BMI 29.27 kg/m  Wt Readings from Last 3 Encounters:  07/27/21 169 lb 3.2 oz (76.7 kg)  06/15/21 165 lb (74.8 kg)  06/09/21 164 lb (74.4 kg)    Diabetic Foot Exam - Simple   No data filed    Lab Results  Component Value Date   WBC 7.1 03/02/2021   HGB 12.6 03/02/2021   HCT 38.7 03/02/2021   PLT 277.0 03/02/2021   GLUCOSE 76 03/02/2021   CHOL 138 07/20/2021   TRIG 98 07/20/2021   HDL 62 07/20/2021   LDLCALC 58 07/20/2021   ALT 13 03/02/2021   AST 14 03/02/2021   NA 141 03/02/2021   K 4.0 03/02/2021   CL 105 03/02/2021   CREATININE 2.06 (H) 03/02/2021   BUN 30 (H) 03/02/2021   CO2 27 03/02/2021   TSH 1.54 08/05/2020   INR 1.17 12/27/2013   HGBA1C 5.9 08/27/2020    Lab Results  Component Value Date   TSH 1.54 08/05/2020   Lab Results  Component Value Date   WBC 7.1 03/02/2021  HGB 12.6 03/02/2021   HCT 38.7 03/02/2021   MCV 89.8 03/02/2021   PLT 277.0 03/02/2021   Lab Results  Component Value Date   NA 141 03/02/2021   K 4.0 03/02/2021   CO2 27 03/02/2021   GLUCOSE 76 03/02/2021   BUN 30 (H) 03/02/2021   CREATININE 2.06 (H) 03/02/2021   BILITOT 0.4 03/02/2021   ALKPHOS 79 03/02/2021   AST 14 03/02/2021   ALT 13 03/02/2021   PROT 6.5 03/02/2021   ALBUMIN 4.2 03/02/2021   CALCIUM 9.3 03/02/2021   ANIONGAP 11 12/27/2013   GFR 23.97 (L) 03/02/2021   Lab Results  Component Value Date   CHOL 138 07/20/2021   Lab Results  Component Value Date   HDL 62 07/20/2021   Lab Results  Component Value Date   LDLCALC 58 07/20/2021   Lab Results  Component Value Date   TRIG 98 07/20/2021   Lab Results  Component Value Date   CHOLHDL 2.2 07/20/2021   Lab Results  Component Value Date    HGBA1C 5.9 08/27/2020       Assessment & Plan:   Problem List Items Addressed This Visit       Unprioritized   COVID-19    Pt did not want antiviral  Cough med per orders  z pak  Call or rto prn  If PO <95%   Go to ER       Relevant Medications   promethazine-dextromethorphan (PROMETHAZINE-DM) 6.25-15 MG/5ML syrup   azithromycin (ZITHROMAX Z-PAK) 250 MG tablet   Other Visit Diagnoses     Acute cough    -  Primary   Relevant Medications   promethazine-dextromethorphan (PROMETHAZINE-DM) 6.25-15 MG/5ML syrup   azithromycin (ZITHROMAX Z-PAK) 250 MG tablet   Other Relevant Orders   POC COVID-19 (Completed)        Meds ordered this encounter  Medications   promethazine-dextromethorphan (PROMETHAZINE-DM) 6.25-15 MG/5ML syrup    Sig: Take 5 mLs by mouth 4 (four) times daily as needed.    Dispense:  118 mL    Refill:  0   azithromycin (ZITHROMAX Z-PAK) 250 MG tablet    Sig: Take 2 tablets by mouth on day 1, then take 1 tablet daily until finished    Dispense:  6 each    Refill:  0    I, Ann Held, DO, personally preformed the services described in this documentation.  All medical record entries made by the scribe were at my direction and in my presence.  I have reviewed the chart and discharge instructions (if applicable) and agree that the record reflects my personal performance and is accurate and complete. 07/27/2021   I,[name],acting as a Education administrator for [employer], DO.,have documented all relevant documentation on the behalf of Ann Held, DO,as directed by  Ann Held, DO while in the presence of Ann Held, DO.   Ann Held, DO

## 2021-07-27 NOTE — Patient Instructions (Signed)
COVID-19: Quarantine and Isolation °Quarantine °If you were exposed °Quarantine and stay away from others when you have been in close contact with someone who has COVID-19. °Isolate °If you are sick or test positive °Isolate when you are sick or when you have COVID-19, even if you don't have symptoms. °When to stay home °Calculating quarantine °The date of your exposure is considered day 0. Day 1 is the first full day after your last contact with a person who has had COVID-19. Stay home and away from other people for at least 5 days. Learn why CDC updated guidance for the general public. °IF YOU were exposed to COVID-19 and are NOT  °up to dateIF YOU were exposed to COVID-19 and are NOT on COVID-19 vaccinations °Quarantine for at least 5 days °Stay home °Stay home and quarantine for at least 5 full days. °Wear a well-fitting mask if you must be around others in your home. °Do not travel. °Get tested °Even if you don't develop symptoms, get tested at least 5 days after you last had close contact with someone with COVID-19. °After quarantine °Watch for symptoms °Watch for symptoms until 10 days after you last had close contact with someone with COVID-19. °Avoid travel °It is best to avoid travel until a full 10 days after you last had close contact with someone with COVID-19. °If you develop symptoms °Isolate immediately and get tested. Continue to stay home until you know the results. Wear a well-fitting mask around others. °Take precautions until day 10 °Wear a well-fitting mask °Wear a well-fitting mask for 10 full days any time you are around others inside your home or in public. Do not go to places where you are unable to wear a well-fitting mask. °If you must travel during days 6-10, take precautions. °Avoid being around people who are more likely to get very sick from COVID-19. °IF YOU were exposed to COVID-19 and are  °up to dateIF YOU were exposed to COVID-19 and are on COVID-19 vaccinations °No  quarantine °You do not need to stay home unless you develop symptoms. °Get tested °Even if you don't develop symptoms, get tested at least 5 days after you last had close contact with someone with COVID-19. °Watch for symptoms °Watch for symptoms until 10 days after you last had close contact with someone with COVID-19. °If you develop symptoms °Isolate immediately and get tested. Continue to stay home until you know the results. Wear a well-fitting mask around others. °Take precautions until day 10 °Wear a well-fitting mask °Wear a well-fitting mask for 10 full days any time you are around others inside your home or in public. Do not go to places where you are unable to wear a well-fitting mask. °Take precautions if traveling °Avoid being around people who are more likely to get very sick from COVID-19. °IF YOU were exposed to COVID-19 and had confirmed COVID-19 within the past 90 days (you tested positive using a viral test) °No quarantine °You do not need to stay home unless you develop symptoms. °Watch for symptoms °Watch for symptoms until 10 days after you last had close contact with someone with COVID-19. °If you develop symptoms °Isolate immediately and get tested. Continue to stay home until you know the results. Wear a well-fitting mask around others. °Take precautions until day 10 °Wear a well-fitting mask °Wear a well-fitting mask for 10 full days any time you are around others inside your home or in public. Do not go to places where you are   unable to wear a well-fitting mask. °Take precautions if traveling °Avoid being around people who are more likely to get very sick from COVID-19. °Calculating isolation °Day 0 is your first day of symptoms or a positive viral test. Day 1 is the first full day after your symptoms developed or your test specimen was collected. If you have COVID-19 or have symptoms, isolate for at least 5 days. °IF YOU tested positive for COVID-19 or have symptoms, regardless of  vaccination status °Stay home for at least 5 days °Stay home for 5 days and isolate from others in your home. °Wear a well-fitting mask if you must be around others in your home. °Do not travel. °Ending isolation if you had symptoms °End isolation after 5 full days if you are fever-free for 24 hours (without the use of fever-reducing medication) and your symptoms are improving. °Ending isolation if you did NOT have symptoms °End isolation after at least 5 full days after your positive test. °If you got very sick from COVID-19 or have a weakened immune system °You should isolate for at least 10 days. Consult your doctor before ending isolation. °Take precautions until day 10 °Wear a well-fitting mask °Wear a well-fitting mask for 10 full days any time you are around others inside your home or in public. Do not go to places where you are unable to wear a well-fitting mask. °Do not travel °Do not travel until a full 10 days after your symptoms started or the date your positive test was taken if you had no symptoms. °Avoid being around people who are more likely to get very sick from COVID-19. °Definitions °Exposure °Contact with someone infected with SARS-CoV-2, the virus that causes COVID-19, in a way that increases the likelihood of getting infected with the virus. °Close contact °A close contact is someone who was less than 6 feet away from an infected person (laboratory-confirmed or a clinical diagnosis) for a cumulative total of 15 minutes or more over a 24-hour period. For example, three individual 5-minute exposures for a total of 15 minutes. People who are exposed to someone with COVID-19 after they completed at least 5 days of isolation are not considered close contacts. °Quarantine °Quarantine is a strategy used to prevent transmission of COVID-19 by keeping people who have been in close contact with someone with COVID-19 apart from others. °Who does not need to quarantine? °If you had close contact with  someone with COVID-19 and you are in one of the following groups, you do not need to quarantine. °You are up to date with your COVID-19 vaccines. °You had confirmed COVID-19 within the last 90 days (meaning you tested positive using a viral test). °If you are up to date with COVID-19 vaccines, you should wear a well-fitting mask around others for 10 days from the date of your last close contact with someone with COVID-19 (the date of last close contact is considered day 0). Get tested at least 5 days after you last had close contact with someone with COVID-19. If you test positive or develop COVID-19 symptoms, isolate from other people and follow recommendations in the Isolation section below. If you tested positive for COVID-19 with a viral test within the previous 90 days and subsequently recovered and remain without COVID-19 symptoms, you do not need to quarantine or get tested after close contact. You should wear a well-fitting mask around others for 10 days from the date of your last close contact with someone with COVID-19 (the date of last   close contact is considered day 0). If you have COVID-19 symptoms, get tested and isolate from other people and follow recommendations in the Isolation section below. °Who should quarantine? °If you come into close contact with someone with COVID-19, you should quarantine if you are not up to date on COVID-19 vaccines. This includes people who are not vaccinated. °What to do for quarantine °Stay home and away from other people for at least 5 days (day 0 through day 5) after your last contact with a person who has COVID-19. The date of your exposure is considered day 0. Wear a well-fitting mask when around others at home, if possible. °For 10 days after your last close contact with someone with COVID-19, watch for fever (100.4°F or greater), cough, shortness of breath, or other COVID-19 symptoms. °If you develop symptoms, get tested immediately and isolate until you receive  your test results. If you test positive, follow isolation recommendations. °If you do not develop symptoms, get tested at least 5 days after you last had close contact with someone with COVID-19. °If you test negative, you can leave your home, but continue to wear a well-fitting mask when around others at home and in public until 10 days after your last close contact with someone with COVID-19. °If you test positive, you should isolate for at least 5 days from the date of your positive test (if you do not have symptoms). If you do develop COVID-19 symptoms, isolate for at least 5 days from the date your symptoms began (the date the symptoms started is day 0). Follow recommendations in the isolation section below. °If you are unable to get a test 5 days after last close contact with someone with COVID-19, you can leave your home after day 5 if you have been without COVID-19 symptoms throughout the 5-day period. Wear a well-fitting mask for 10 days after your date of last close contact when around others at home and in public. °Avoid people who are have weakened immune systems or are more likely to get very sick from COVID-19, and nursing homes and other high-risk settings, until after at least 10 days. °If possible, stay away from people you live with, especially people who are at higher risk for getting very sick from COVID-19, as well as others outside your home throughout the full 10 days after your last close contact with someone with COVID-19. °If you are unable to quarantine, you should wear a well-fitting mask for 10 days when around others at home and in public. °If you are unable to wear a mask when around others, you should continue to quarantine for 10 days. Avoid people who have weakened immune systems or are more likely to get very sick from COVID-19, and nursing homes and other high-risk settings, until after at least 10 days. °See additional information about travel. °Do not go to places where you are  unable to wear a mask, such as restaurants and some gyms, and avoid eating around others at home and at work until after 10 days after your last close contact with someone with COVID-19. °After quarantine °Watch for symptoms until 10 days after your last close contact with someone with COVID-19. °If you have symptoms, isolate immediately and get tested. °Quarantine in high-risk congregate settings °In certain congregate settings that have high risk of secondary transmission (such as correctional and detention facilities, homeless shelters, or cruise ships), CDC recommends a 10-day quarantine for residents, regardless of vaccination and booster status. During periods of critical staffing   shortages, facilities may consider shortening the quarantine period for staff to ensure continuity of operations. Decisions to shorten quarantine in these settings should be made in consultation with state, local, tribal, or territorial health departments and should take into consideration the context and characteristics of the facility. CDC's setting-specific guidance provides additional recommendations for these settings. °Isolation °Isolation is used to separate people with confirmed or suspected COVID-19 from those without COVID-19. People who are in isolation should stay home until it's safe for them to be around others. At home, anyone sick or infected should separate from others, or wear a well-fitting mask when they need to be around others. People in isolation should stay in a specific "sick room" or area and use a separate bathroom if available. Everyone who has presumed or confirmed COVID-19 should stay home and isolate from other people for at least 5 full days (day 0 is the first day of symptoms or the date of the day of the positive viral test for asymptomatic persons). They should wear a mask when around others at home and in public for an additional 5 days. People who are confirmed to have COVID-19 or are showing  symptoms of COVID-19 need to isolate regardless of their vaccination status. This includes: °People who have a positive viral test for COVID-19, regardless of whether or not they have symptoms. °People with symptoms of COVID-19, including people who are awaiting test results or have not been tested. People with symptoms should isolate even if they do not know if they have been in close contact with someone with COVID-19. °What to do for isolation °Monitor your symptoms. If you have an emergency warning sign (including trouble breathing), seek emergency medical care immediately. °Stay in a separate room from other household members, if possible. °Use a separate bathroom, if possible. °Take steps to improve ventilation at home, if possible. °Avoid contact with other members of the household and pets. °Don't share personal household items, like cups, towels, and utensils. °Wear a well-fitting mask when you need to be around other people. °Learn more about what to do if you are sick and how to notify your contacts. °Ending isolation for people who had COVID-19 and had symptoms °If you had COVID-19 and had symptoms, isolate for at least 5 days. To calculate your 5-day isolation period, day 0 is your first day of symptoms. Day 1 is the first full day after your symptoms developed. You can leave isolation after 5 full days. °You can end isolation after 5 full days if you are fever-free for 24 hours without the use of fever-reducing medication and your other symptoms have improved (Loss of taste and smell may persist for weeks or months after recovery and need not delay the end of isolation). °You should continue to wear a well-fitting mask around others at home and in public for 5 additional days (day 6 through day 10) after the end of your 5-day isolation period. If you are unable to wear a mask when around others, you should continue to isolate for a full 10 days. Avoid people who have weakened immune systems or are more  likely to get very sick from COVID-19, and nursing homes and other high-risk settings, until after at least 10 days. °If you continue to have fever or your other symptoms have not improved after 5 days of isolation, you should wait to end your isolation until you are fever-free for 24 hours without the use of fever-reducing medication and your other symptoms have improved.   Continue to wear a well-fitting mask through day 10. Contact your healthcare provider if you have questions. °See additional information about travel. °Do not go to places where you are unable to wear a mask, such as restaurants and some gyms, and avoid eating around others at home and at work until a full 10 days after your first day of symptoms. °If an individual has access to a test and wants to test, the best approach is to use an antigen test1 towards the end of the 5-day isolation period. Collect the test sample only if you are fever-free for 24 hours without the use of fever-reducing medication and your other symptoms have improved (loss of taste and smell may persist for weeks or months after recovery and need not delay the end of isolation). If your test result is positive, you should continue to isolate until day 10. If your test result is negative, you can end isolation, but continue to wear a well-fitting mask around others at home and in public until day 10. Follow additional recommendations for masking and avoiding travel as described above. °1As noted in the labeling for authorized over-the counter antigen tests: Negative results should be treated as presumptive. Negative results do not rule out SARS-CoV-2 infection and should not be used as the sole basis for treatment or patient management decisions, including infection control decisions. To improve results, antigen tests should be used twice over a three-day period with at least 24 hours and no more than 48 hours between tests. °Note that these recommendations on ending isolation  do not apply to people who are moderately ill or very sick from COVID-19 or have weakened immune systems. See section below for recommendations for when to end isolation for these groups. °Ending isolation for people who tested positive for COVID-19 but had no symptoms °If you test positive for COVID-19 and never develop symptoms, isolate for at least 5 days. Day 0 is the day of your positive viral test (based on the date you were tested) and day 1 is the first full day after the specimen was collected for your positive test. You can leave isolation after 5 full days. °If you continue to have no symptoms, you can end isolation after at least 5 days. °You should continue to wear a well-fitting mask around others at home and in public until day 10 (day 6 through day 10). If you are unable to wear a mask when around others, you should continue to isolate for 10 days. Avoid people who have weakened immune systems or are more likely to get very sick from COVID-19, and nursing homes and other high-risk settings, until after at least 10 days. °If you develop symptoms after testing positive, your 5-day isolation period should start over. Day 0 is your first day of symptoms. Follow the recommendations above for ending isolation for people who had COVID-19 and had symptoms. °See additional information about travel. °Do not go to places where you are unable to wear a mask, such as restaurants and some gyms, and avoid eating around others at home and at work until 10 days after the day of your positive test. °If an individual has access to a test and wants to test, the best approach is to use an antigen test1 towards the end of the 5-day isolation period. If your test result is positive, you should continue to isolate until day 10. If your test result is positive, you can also choose to test daily and if your test result   is negative, you can end isolation, but continue to wear a well-fitting mask around others at home and in  public until day 10. Follow additional recommendations for masking and avoiding travel as described above. °1As noted in the labeling for authorized over-the counter antigen tests: Negative results should be treated as presumptive. Negative results do not rule out SARS-CoV-2 infection and should not be used as the sole basis for treatment or patient management decisions, including infection control decisions. To improve results, antigen tests should be used twice over a three-day period with at least 24 hours and no more than 48 hours between tests. °Ending isolation for people who were moderately or very sick from COVID-19 or have a weakened immune system °People who are moderately ill from COVID-19 (experiencing symptoms that affect the lungs like shortness of breath or difficulty breathing) should isolate for 10 days and follow all other isolation precautions. To calculate your 10-day isolation period, day 0 is your first day of symptoms. Day 1 is the first full day after your symptoms developed. If you are unsure if your symptoms are moderate, talk to a healthcare provider for further guidance. °People who are very sick from COVID-19 (this means people who were hospitalized or required intensive care or ventilation support) and people who have weakened immune systems might need to isolate at home longer. They may also require testing with a viral test to determine when they can be around others. CDC recommends an isolation period of at least 10 and up to 20 days for people who were very sick from COVID-19 and for people with weakened immune systems. Consult with your healthcare provider about when you can resume being around other people. If you are unsure if your symptoms are severe or if you have a weakened immune system, talk to a healthcare provider for further guidance. °People who have a weakened immune system should talk to their healthcare provider about the potential for reduced immune responses to  COVID-19 vaccines and the need to continue to follow current prevention measures (including wearing a well-fitting mask and avoiding crowds and poorly ventilated indoor spaces) to protect themselves against COVID-19 until advised otherwise by their healthcare provider. Close contacts of immunocompromised people--including household members--should also be encouraged to receive all recommended COVID-19 vaccine doses to help protect these people. °Isolation in high-risk congregate settings °In certain high-risk congregate settings that have high risk of secondary transmission and where it is not feasible to cohort people (such as correctional and detention facilities, homeless shelters, and cruise ships), CDC recommends a 10-day isolation period for residents. During periods of critical staffing shortages, facilities may consider shortening the isolation period for staff to ensure continuity of operations. Decisions to shorten isolation in these settings should be made in consultation with state, local, tribal, or territorial health departments and should take into consideration the context and characteristics of the facility. CDC's setting-specific guidance provides additional recommendations for these settings. °This CDC guidance is meant to supplement--not replace--any federal, state, local, territorial, or tribal health and safety laws, rules, and regulations. °Recommendations for specific settings °These recommendations do not apply to healthcare professionals. For guidance specific to these settings, see °Healthcare professionals: Interim Guidance for Managing Healthcare Personnel with SARS-CoV-2 Infection or Exposure to SARS-CoV-2 °Patients, residents, and visitors to healthcare settings: Interim Infection Prevention and Control Recommendations for Healthcare Personnel During the Coronavirus Disease 2019 (COVID-19) Pandemic °Additional setting-specific guidance and recommendations are available. °These  recommendations on quarantine and isolation do apply to K[employer] School   settings. Additional guidance is available here: Overview of COVID-19 Quarantine for K-12 Schools °Travelers: Travel information and recommendations °Congregate facilities and other settings: guidance pages for community, work, and school settings °Ongoing COVID-19 exposure FAQs °I live with someone with COVID-19, but I cannot be separated from them. How do we manage quarantine in this situation? °It is very important for people with COVID-19 to remain apart from other people, if possible, even if they are living together. If separation of the person with COVID-19 from others that they live with is not possible, the other people that they live with will have ongoing exposure, meaning they will be repeatedly exposed until that person is no longer able to spread the virus to other people. In this situation, there are precautions you can take to limit the spread of COVID-19: °The person with COVID-19 and everyone they live with should wear a well-fitting mask inside the home. °If possible, one person should care for the person with COVID-19 to limit the number of people who are in close contact with the infected person. °Take steps to protect yourself and others to reduce transmission in the home: °Quarantine if you are not up to date with your COVID-19 vaccines. °Isolate if you are sick or tested positive for COVID-19, even if you don't have symptoms. °Learn more about the public health recommendations for testing, mask use and quarantine of close contacts, like yourself, who have ongoing exposure. These recommendations differ depending on your vaccination status. °What should I do if I have ongoing exposure to COVID-19 from someone I live with? °Recommendations for this situation depend on your vaccination status: °If you are not up to date on COVID-19 vaccines and have ongoing exposure to COVID-19, you should: °Begin quarantine immediately and  continue to quarantine throughout the isolation period of the person with COVID-19. °Continue to quarantine for an additional 5 days starting the day after the end of isolation for the person with COVID-19. °Get tested at least 5 days after the end of isolation of the infected person that lives with them. °If you test negative, you can leave the home but should continue to wear a well-fitting mask when around others at home and in public until 10 days after the end of isolation for the person with COVID-19. °Isolate immediately if you develop symptoms of COVID-19 or test positive. °If you are up to date with COVID-19 vaccines and have ongoing exposure to COVID-19, you should: °Get tested at least 5 days after your first exposure. A person with COVID-19 is considered infectious starting 2 days before they develop symptoms, or 2 days before the date of their positive test if they do not have symptoms. °Get tested again at least 5 days after the end of isolation for the person with COVID-19. °Wear a well-fitting mask when you are around the person with COVID-19, and do this throughout their isolation period. °Wear a well-fitting mask around others for 10 days after the infected person's isolation period ends. °Isolate immediately if you develop symptoms of COVID-19 or test positive. °What should I do if multiple people I live with test positive for COVID-19 at different times? °Recommendations for this situation depend on your vaccination status: °If you are not up to date with your COVID-19 vaccines, you should: °Quarantine throughout the isolation period of any infected person that you live with. °Continue to quarantine until 5 days after the end of isolation date for the most recently infected person that lives with you. For example, if   the last day of isolation of the person most recently infected with COVID-19 was June 30, the new 5-day quarantine period starts on July 1. °Get tested at least 5 days after the end  of isolation for the most recently infected person that lives with you. °Wear a well-fitting mask when you are around any person with COVID-19 while that person is in isolation. °Wear a well-fitting mask when you are around other people until 10 days after your last close contact. °Isolate immediately if you develop symptoms of COVID-19 or test positive. °If you are up to date with your COVID-19 vaccines, you should: °Get tested at least 5 days after your first exposure. A person with COVID-19 is considered infectious starting 2 days before they developed symptoms, or 2 days before the date of their positive test if they do not have symptoms. °Get tested again at least 5 days after the end of isolation for the most recently infected person that lives with you. °Wear a well-fitting mask when you are around any person with COVID-19 while that person is in isolation. °Wear a well-fitting mask around others for 10 days after the end of isolation for the most recently infected person that lives with you. For example, if the last day of isolation for the person most recently infected with COVID-19 was June 30, the new 10-day period to wear a well-fitting mask indoors in public starts on July 1. °Isolate immediately if you develop symptoms of COVID-19 or test positive. °I had COVID-19 and completed isolation. Do I have to quarantine or get tested if someone I live with gets COVID-19 shortly after I completed isolation? °No. If you recently completed isolation and someone that lives with you tests positive for the virus that causes COVID-19 shortly after the end of your isolation period, you do not have to quarantine or get tested as long as you do not develop new symptoms. Once all of the people that live together have completed isolation or quarantine, refer to the guidance below for new exposures to COVID-19. °If you had COVID-19 in the previous 90 days and then came into close contact with someone with COVID-19, you do  not have to quarantine or get tested if you do not have symptoms. But you should: °Wear a well-fitting mask indoors in public for 10 days after your last close contact. °Monitor for COVID-19 symptoms for 10 days from the date of your last close contact. °Isolate immediately and get tested if symptoms develop. °If more than 90 days have passed since your recovery from infection, follow CDC's recommendations for close contacts. These recommendations will differ depending on your vaccination status. °05/13/2020 °Content source: National Center for Immunization and Respiratory Diseases (NCIRD), Division of Viral Diseases °This information is not intended to replace advice given to you by your health care provider. Make sure you discuss any questions you have with your health care provider. °Document Revised: 09/16/2020 Document Reviewed: 09/16/2020 °Elsevier Patient Education © 2022 [employer] ° °

## 2021-07-27 NOTE — Assessment & Plan Note (Signed)
Pt did not want antiviral  Cough med per orders  z pak  Call or rto prn  If PO <95%   Go to ER

## 2021-07-28 ENCOUNTER — Encounter: Payer: Self-pay | Admitting: Cardiology

## 2021-09-13 ENCOUNTER — Encounter: Payer: Self-pay | Admitting: Cardiology

## 2021-09-13 ENCOUNTER — Ambulatory Visit (INDEPENDENT_AMBULATORY_CARE_PROVIDER_SITE_OTHER): Payer: Medicare Other | Admitting: Pulmonary Disease

## 2021-09-13 ENCOUNTER — Encounter: Payer: Self-pay | Admitting: Pulmonary Disease

## 2021-09-13 VITALS — BP 108/64 | HR 60 | Temp 98.2°F | Ht 66.0 in | Wt 169.6 lb

## 2021-09-13 DIAGNOSIS — G4733 Obstructive sleep apnea (adult) (pediatric): Secondary | ICD-10-CM | POA: Diagnosis not present

## 2021-09-13 DIAGNOSIS — I2510 Atherosclerotic heart disease of native coronary artery without angina pectoris: Secondary | ICD-10-CM

## 2021-09-13 NOTE — Patient Instructions (Signed)
  Discussed with cardiology whether they want you on long-acting Toprol.  X trial of AirFit F30 fullface mask or DreamWear medium full facemask.  If you still have persistent symptoms, call us and we will refer her for inspire implantation

## 2021-09-13 NOTE — Progress Notes (Signed)
   Subjective:    Patient ID: Kelsey Church, female    DOB: April 30, 1950, 71 y.o.   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 Moderate OSA was diagnosed in 2013 ,on CPAP with nasal pillows since then, DME is [facility]   PMH - GERD  Chief Complaint  Patient presents with   Follow-up    Pt states she is still not sleeping good and hallucinations last night.    Last visit with me was 10/2020. She saw APP 05/2021 with high residual events on CPAP on auto settings 8 to 12 cm , was switched to fixed pressure of 9 cm after titration study 05/2021.  We reviewed this titration study She uses nasal pillows and reports drooling For some reason she is maintained on Toprol-XL 25 mg 1 tablet daytime and 2 tablets at night. She reports hallucinations and restless sleep  Significant tests/ events reviewed   05/2021 CPAP titration study-164 pounds-CPAP 9 cm  2013 PSG  moderate OSa with AHi 22/h & lowest desatn to 87% corrected by CPAP 9 cm with swift nasal pillows  Review of Systems neg for any significant sore throat, dysphagia, itching, sneezing, nasal congestion or excess/ purulent secretions, fever, chills, sweats, unintended wt loss, pleuritic or exertional cp, hempoptysis, orthopnea pnd or change in chronic leg swelling. Also denies presyncope, palpitations, heartburn, abdominal pain, nausea, vomiting, diarrhea or change in bowel or urinary habits, dysuria,hematuria, rash, arthralgias, visual complaints, headache, numbness weakness or ataxia.     Objective:   Physical Exam   Gen. Pleasant, well-nourished, in no distress ENT - no thrush, no pallor/icterus,no post nasal drip, Neck: No JVD, no thyromegaly, no carotid bruits Lungs: no use of accessory muscles, no dullness to percussion, clear without rales or rhonchi  Cardiovascular: Rhythm regular, heart sounds  normal, no murmurs or gallops, no peripheral edema Musculoskeletal: No deformities, no cyanosis or clubbing          Assessment & Plan:   Hypertension/PVCs -she is maintained on long-acting metoprolol for some reason and I wonder if this is causing her side effects of hallucinations and restless sleep

## 2021-09-13 NOTE — Assessment & Plan Note (Signed)
CPAP download was reviewed which shows large leak on 9 cm with residual AHI of 14/hour predominantly obstructive apneas Not sure why this is the case since during the titration study she seem to require 9 cm.  I feel mild leak has something to do with this.  I offered her alternatives of AirFit F30 fullface mask.  She has obtained a Brewing technologist from her son and will try this for a little while. We also discussed alternative therapy including hypoglossal nerve implant and she is willing to explore

## 2021-09-15 DIAGNOSIS — Z905 Acquired absence of kidney: Secondary | ICD-10-CM | POA: Diagnosis not present

## 2021-09-15 DIAGNOSIS — C642 Malignant neoplasm of left kidney, except renal pelvis: Secondary | ICD-10-CM | POA: Diagnosis not present

## 2021-09-15 DIAGNOSIS — I2510 Atherosclerotic heart disease of native coronary artery without angina pectoris: Secondary | ICD-10-CM | POA: Diagnosis not present

## 2021-09-15 DIAGNOSIS — R1310 Dysphagia, unspecified: Secondary | ICD-10-CM | POA: Diagnosis not present

## 2021-09-15 DIAGNOSIS — R441 Visual hallucinations: Secondary | ICD-10-CM | POA: Diagnosis not present

## 2021-09-15 DIAGNOSIS — D631 Anemia in chronic kidney disease: Secondary | ICD-10-CM | POA: Diagnosis not present

## 2021-09-15 DIAGNOSIS — N184 Chronic kidney disease, stage 4 (severe): Secondary | ICD-10-CM | POA: Diagnosis not present

## 2021-09-22 ENCOUNTER — Encounter: Payer: Self-pay | Admitting: Pulmonary Disease

## 2021-09-22 DIAGNOSIS — G4733 Obstructive sleep apnea (adult) (pediatric): Secondary | ICD-10-CM

## 2021-09-22 NOTE — Telephone Encounter (Signed)
Received message from patient. She would like to have a RX sent to [facility] for the following mask: AirTouch F20 Medium to have on file.   Dr. Elsworth Soho, are you ok with [place] placing the order for this mask?

## 2021-09-28 DIAGNOSIS — D225 Melanocytic nevi of trunk: Secondary | ICD-10-CM | POA: Diagnosis not present

## 2021-09-28 DIAGNOSIS — D1801 Hemangioma of skin and subcutaneous tissue: Secondary | ICD-10-CM | POA: Diagnosis not present

## 2021-09-28 DIAGNOSIS — L814 Other melanin hyperpigmentation: Secondary | ICD-10-CM | POA: Diagnosis not present

## 2021-09-28 DIAGNOSIS — Z85828 Personal history of other malignant neoplasm of skin: Secondary | ICD-10-CM | POA: Diagnosis not present

## 2021-09-28 DIAGNOSIS — D485 Neoplasm of uncertain behavior of skin: Secondary | ICD-10-CM | POA: Diagnosis not present

## 2021-10-12 DIAGNOSIS — Z85828 Personal history of other malignant neoplasm of skin: Secondary | ICD-10-CM | POA: Diagnosis not present

## 2021-10-12 DIAGNOSIS — L988 Other specified disorders of the skin and subcutaneous tissue: Secondary | ICD-10-CM | POA: Diagnosis not present

## 2021-10-12 DIAGNOSIS — D485 Neoplasm of uncertain behavior of skin: Secondary | ICD-10-CM | POA: Diagnosis not present

## 2021-10-25 DIAGNOSIS — Z85528 Personal history of other malignant neoplasm of kidney: Secondary | ICD-10-CM | POA: Diagnosis not present

## 2021-10-28 ENCOUNTER — Other Ambulatory Visit: Payer: Self-pay | Admitting: Urology

## 2021-10-28 DIAGNOSIS — Z85528 Personal history of other malignant neoplasm of kidney: Secondary | ICD-10-CM

## 2021-10-29 DIAGNOSIS — H35361 Drusen (degenerative) of macula, right eye: Secondary | ICD-10-CM | POA: Diagnosis not present

## 2021-10-29 DIAGNOSIS — H25813 Combined forms of age-related cataract, bilateral: Secondary | ICD-10-CM | POA: Diagnosis not present

## 2021-11-04 ENCOUNTER — Other Ambulatory Visit: Payer: Self-pay

## 2021-11-04 DIAGNOSIS — F3342 Major depressive disorder, recurrent, in full remission: Secondary | ICD-10-CM

## 2021-11-04 MED ORDER — CITALOPRAM HYDROBROMIDE 20 MG PO TABS: 20.0000 mg | ORAL_TABLET | Freq: Every day | ORAL | 1 refills | Status: DC

## 2021-11-12 ENCOUNTER — Ambulatory Visit
Admission: RE | Admit: 2021-11-12 | Discharge: 2021-11-12 | Disposition: A | Discharge status: Home / Self Care | Payer: Medicare Other | Source: Ambulatory Visit | Attending: Urology | Admitting: Urology

## 2021-11-12 DIAGNOSIS — Z85528 Personal history of other malignant neoplasm of kidney: Secondary | ICD-10-CM

## 2021-11-12 DIAGNOSIS — Z905 Acquired absence of kidney: Secondary | ICD-10-CM | POA: Diagnosis not present

## 2021-11-12 DIAGNOSIS — Z9049 Acquired absence of other specified parts of digestive tract: Secondary | ICD-10-CM | POA: Diagnosis not present

## 2021-11-12 DIAGNOSIS — Z85820 Personal history of malignant melanoma of skin: Secondary | ICD-10-CM | POA: Diagnosis not present

## 2021-11-12 MED ORDER — GADOBENATE DIMEGLUMINE 529 MG/ML IV SOLN
15.0000 mL | Freq: Once | INTRAVENOUS | Status: AC | PRN
  Administered 2021-11-12: 15 mL via INTRAVENOUS

## 2021-11-20 DIAGNOSIS — S62307A Unspecified fracture of fifth metacarpal bone, left hand, initial encounter for closed fracture: Secondary | ICD-10-CM | POA: Diagnosis not present

## 2021-11-22 ENCOUNTER — Ambulatory Visit (INDEPENDENT_AMBULATORY_CARE_PROVIDER_SITE_OTHER): Payer: Medicare Other

## 2021-11-22 ENCOUNTER — Ambulatory Visit (INDEPENDENT_AMBULATORY_CARE_PROVIDER_SITE_OTHER): Payer: Medicare Other | Admitting: Sports Medicine

## 2021-11-22 VITALS — BP 108/70 | Ht 66.0 in | Wt 169.0 lb

## 2021-11-22 DIAGNOSIS — G8929 Other chronic pain: Secondary | ICD-10-CM | POA: Diagnosis not present

## 2021-11-22 DIAGNOSIS — M25561 Pain in right knee: Secondary | ICD-10-CM

## 2021-11-22 DIAGNOSIS — M25551 Pain in right hip: Secondary | ICD-10-CM

## 2021-11-22 DIAGNOSIS — Z471 Aftercare following joint replacement surgery: Secondary | ICD-10-CM | POA: Diagnosis not present

## 2021-11-22 DIAGNOSIS — S62338A Displaced fracture of neck of other metacarpal bone, initial encounter for closed fracture: Secondary | ICD-10-CM | POA: Diagnosis not present

## 2021-11-22 DIAGNOSIS — W19XXXD Unspecified fall, subsequent encounter: Secondary | ICD-10-CM

## 2021-11-22 DIAGNOSIS — Z96651 Presence of right artificial knee joint: Secondary | ICD-10-CM | POA: Diagnosis not present

## 2021-11-22 DIAGNOSIS — M79642 Pain in left hand: Secondary | ICD-10-CM | POA: Diagnosis not present

## 2021-11-22 DIAGNOSIS — M160 Bilateral primary osteoarthritis of hip: Secondary | ICD-10-CM | POA: Diagnosis not present

## 2021-11-22 NOTE — Patient Instructions (Addendum)
Good to see you  Pt referral Tylenol [phone] mg 2-3 times a day for pain relief  As needed follow up

## 2021-11-22 NOTE — Progress Notes (Signed)
Benito Mccreedy D.[place] Phone: [phone]   Assessment and Plan:     1. Right hip pain 2. Chronic pain of right knee 3.  Falls -Chronic with exacerbation, initial sports medicine visit - Flare of right hip pain and right knee pain after fall on 11/19/2021 - X-rays obtained in clinic.  My interpretation: No acute fracture or dislocation.  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  Cortical changes throughout the femoral head and acetabulum consistent with changes seen on x-ray from 01/2021 - Start Tylenol 500 to 1000 mg tablets 2-3 times a day for day-to-day pain relief - With multiple falls in the last 1 year, recommend referral to physical therapy for overall increase strengthening and conditioning - DG Knee AP/LAT W/Sunrise Right; Future - Ambulatory referral to Physical Therapy    Pertinent previous records reviewed include x-ray left hip and pelvis 01/2021, lab work including creatinine from 2022   Follow Up: As needed if no improvement or worsening symptoms   Subjective:   I, [place], am serving as a Education administrator for [employer]  Chief Complaint: right hip and knee pain   HPI:   11/22/21 Patient is a 71 year old female complaining of right hip and knee pain . Patient states she fell Friday evening , reached for a remote control and turned to quickly and had to miss the couch, does have a broken left hand , she thinks all her body weight went onto her knee and her right hip and thigh, she says he has a huge hematoma, a year ago to the month she had a similar fall on her left side seen by Dr. Georgina Snell, does have numbness and tingling from her knee down, no lockig clicking or popping, had immediate swelling on her knee went down with ice , she is able to walk, has been taking tylenol for the pain and that seems to be helping, has bruising on her right and left shoulder she states     Relevant Historical Information: CKD, hypertension, hyperlipidemia, prediabetes  Additional pertinent review of systems negative.   Current Outpatient Medications:    acetaminophen (TYLENOL) 650 MG CR tablet, Take 1,300 mg by mouth every 8 (eight) hours as needed for pain. , Disp: , Rfl:    aspirin EC 81 MG tablet, Take 1 tablet (81 mg total) by mouth daily., Disp: 90 tablet, Rfl: 3   citalopram (CELEXA) 20 MG tablet, Take 1 tablet (20 mg total) by mouth at bedtime., Disp: 90 tablet, Rfl: 1   Evolocumab (REPATHA SURECLICK) 355 MG/ML SOAJ, Inject 1 mL into the skin every 14 (fourteen) days., Disp: 6 mL, Rfl: 3   metoprolol succinate (TOPROL-XL) 25 MG 24 hr tablet, Take 2 tablets (50 mg total) by mouth in the morning AND 1 tablet (25 mg total) every evening., Disp: 270 tablet, Rfl: 3   nitroGLYCERIN (NITROSTAT) 0.4 MG SL tablet, Place 1 tablet (0.4 mg total) under the tongue every 5 (five) minutes as needed for chest pain., Disp: 25 tablet, Rfl: 3   ranolazine (RANEXA) 500 MG 12 hr tablet, Take 1 tablet (500 mg total) by mouth 2 (two) times daily., Disp: 180 tablet, Rfl: 3   Objective:     Vitals:   11/22/21 1453  BP: 108/70  Weight: 169 lb (76.7 kg)  Height: '5\' 6"'$  (1.676 m)      Body mass index is 27.28 kg/m.    Physical Exam:  General: awake, alert, and oriented no acute distress, nontoxic Skin: no suspicious lesions or rashes Neuro:sensation intact distally with no dificits, normal muscle tone, no atrophy, strength 5/5 in all tested lower ext groups Psych: normal mood and affect, speech clear  Right hip: No deformity, swelling or wasting.  Hematoma laterally over greater trochanter ROM Flexion 80, ext 30, IR 30, ER 40 TTP greater trochanter NTTP over the hip flexors,   gluteal musculature, si joint, lumbar spine Negative log roll with FROM Negative FABER Positive FADIR  Gait normal   General:  awake, alert oriented, no acute distress nontoxic Skin: no suspicious  lesions or rashes Neuro:sensation intact, no deficits, strength 5/5 with no deficits, no atrophy, normal muscle tone Psych: No signs of anxiety, depression or other mood disorder  Right knee: Mild swelling No deformity Neg fluid wave, joint milking ROM Flex 110 , Ext 0  TTP lateral femoral condyle NTTP over the quad tendon, medial fem condyle,  , patella, plica, patella tendon, tibial tuberostiy, fibular head, posterior fossa, pes anserine bursa, gerdy's tubercle, medial jt line, lateral jt line    Electronically signed by:  Benito Mccreedy D.[facility] 3:09 PM 11/22/21

## 2021-11-23 ENCOUNTER — Other Ambulatory Visit: Payer: Self-pay | Admitting: Sports Medicine

## 2021-11-23 DIAGNOSIS — W19XXXD Unspecified fall, subsequent encounter: Secondary | ICD-10-CM

## 2021-11-23 DIAGNOSIS — M25551 Pain in right hip: Secondary | ICD-10-CM

## 2021-11-23 NOTE — Progress Notes (Signed)
Action items:   we will order MRI right hip to [facility] location for right hip pain, fall, possible fracture  I called and spoke with patient and discussed x-ray findings on 1 view that were concerning for possible fracture.  Discussed that we will further investigate with MRI to rule out fracture.  Reassuring that abnormalities were not seen on other x-ray views and that patient was walking without pain.  Patient verbalized agreement and had no further questions.

## 2021-11-23 NOTE — Progress Notes (Signed)
MRI I was ordered to [facility]

## 2021-11-27 ENCOUNTER — Ambulatory Visit (INDEPENDENT_AMBULATORY_CARE_PROVIDER_SITE_OTHER): Payer: Medicare Other

## 2021-11-27 DIAGNOSIS — W19XXXD Unspecified fall, subsequent encounter: Secondary | ICD-10-CM | POA: Diagnosis not present

## 2021-11-27 DIAGNOSIS — M25551 Pain in right hip: Secondary | ICD-10-CM

## 2021-11-29 DIAGNOSIS — S62338A Displaced fracture of neck of other metacarpal bone, initial encounter for closed fracture: Secondary | ICD-10-CM | POA: Diagnosis not present

## 2021-11-29 NOTE — Progress Notes (Signed)
Pt was called and notified that MRI was unremarkable

## 2021-12-02 ENCOUNTER — Encounter: Payer: Self-pay | Admitting: Family Medicine

## 2021-12-02 ENCOUNTER — Ambulatory Visit (INDEPENDENT_AMBULATORY_CARE_PROVIDER_SITE_OTHER): Payer: Medicare Other | Admitting: Family Medicine

## 2021-12-02 VITALS — BP 114/70 | HR 64 | Temp 97.8°F | Resp 18 | Ht 66.0 in | Wt 173.8 lb

## 2021-12-02 DIAGNOSIS — E785 Hyperlipidemia, unspecified: Secondary | ICD-10-CM | POA: Diagnosis not present

## 2021-12-02 DIAGNOSIS — Z23 Encounter for immunization: Secondary | ICD-10-CM

## 2021-12-02 DIAGNOSIS — I2510 Atherosclerotic heart disease of native coronary artery without angina pectoris: Secondary | ICD-10-CM

## 2021-12-02 NOTE — Assessment & Plan Note (Signed)
Encourage heart healthy diet such as MIND or DASH diet, increase exercise, avoid trans fats, simple carbohydrates and processed foods, consider a krill or fish or flaxseed oil cap daily.  °

## 2021-12-02 NOTE — Progress Notes (Signed)
Subjective:   By signing my name below, I, Kelsey Church, attest that this documentation has been prepared under the direction and in the presence of Kelsey Held, DO. 12/02/2021    Patient ID: Kelsey Church, female    DOB: August 03, 1950, 71 y.o.   MRN: [id number]  Chief Complaint  Patient presents with   Hyperlipidemia   Follow-up    HPI Patient is in today for a follow up visit.   She reports while reaching across for the remote control, she fall and hit her left hand on a door and landed on her left knee. She has seen a sports medicine specialist and found she had a minor fracture on her left hand. She has bruises on her left leg from the fall. She thinks her toes might be causing her to lose balance and fall. 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 She is seeing a physical therapist at this time and is willing to bring up her balance issues to them during her next visit.  She continues having difficulties using CPAP machines. She has switched her mask but has difficulties using it due to moisture building up around her eyes while using it. She is interested in trying a inspire machine if she cannot adjust with her new CPAP machine.  Her last cholesterol levels were good. She continues taking 140 mg /ml repatha injections and reports no new issues while taking it. She was on repatha for 2 months when she last took her blood work.  Lab Results  Component Value Date   CHOL 138 07/20/2021   HDL 62 07/20/2021   LDLCALC 58 07/20/2021   TRIG 98 07/20/2021   CHOLHDL 2.2 07/20/2021   She is interested in receiving a flu vaccine during this visit.    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/10/13   Cancer ([place])    melanoma in 2 sights   Complication of anesthesia    "chills every evening at sundown x 2 weeks"- no fever   Depression    Dysphagia    History  of kidney cancer    left   History of shingles    NO RESIDUAL PROBLEMS   Hypertension    Pain    LOWER BACK PAIN   Sleep apnea    CPAP    Past Surgical History:  Procedure Laterality Date   APPENDECTOMY  1958   COLONOSCOPY     COLONOSCOPY W/ POLYPECTOMY     7 polyps   ESOPHAGEAL MANOMETRY N/A 03/17/2021   Procedure: ESOPHAGEAL MANOMETRY (EM);  Surgeon: Jerene Bears, MD;  Location: [facility];  Service: Gastroenterology;  Laterality: N/A;   FOOT SURGERY Right    removal plantar wart   GALLBLADDER SURGERY  02/14/1990   INJECTION KNEE  09/2011   right    KNEE ARTHROSCOPY  1996   LEFT HEART CATH AND CORONARY ANGIOGRAPHY N/A 07/11/2019   Procedure: LEFT HEART CATH AND CORONARY ANGIOGRAPHY;  Surgeon: Belva Crome, MD;  Location: [facility];  Service: Cardiovascular;  Laterality: N/A;   MELANOMA EXCISION  1992   right chin, back   NEPHRECTOMY Left 2022   POLYPECTOMY     SALPINGOOPHORECTOMY  2009   Right   TOTAL KNEE ARTHROPLASTY Right 06/10/2013   Procedure: RIGHT TOTAL KNEE ARTHROPLASTY;  Surgeon: [name], MD;  Location: [facility];  Service: Orthopedics;  Laterality: Right;   TOTAL KNEE ARTHROPLASTY Left 12/16/2013   Procedure: LEFT TOTAL KNEE ARTHROPLASTY;  Surgeon: [name], MD;  Location: [facility];  Service: Orthopedics;  Laterality: Left;    Family History  Problem Relation Age of Onset   Osteoporosis Mother    Breast cancer Mother    Heart disease Father    Heart attack Father 53       53   Hypertension Father    Hypertension Sister    Diabetes Sister    Colon polyps Sister    Cancer Maternal Grandmother        Breast and Uterine Cancer   Breast cancer Maternal Grandmother    Thyroid cancer Maternal Grandmother     Social History   Socioeconomic History   Marital status: Widowed    Spouse name: Not on file   Number of children: 1   Years of education: Not on file   Highest education level: Not on file  Occupational History    Occupation: CMA    Employer:   Tobacco Use   Smoking status: Never    Passive exposure: Never   Smokeless tobacco: Never  Vaping Use   Vaping Use: Never used  Substance and Sexual Activity   Alcohol use: No    Alcohol/week: 0.0 standard drinks of alcohol   Drug use: No   Sexual activity: Not Currently    Partners: Male    Birth control/protection: Post-menopausal    Comment: 1st intercourse- 18, partners- 1,   Other Topics Concern   Not on file  Social History Narrative   Lives alone.  Widow.  One son.    Caffienated drinks-no   Seat belt use often-yes   Regular Exercise-yes   Smoke alarm in the home-yes   Firearms/guns in the home-no   History of physical abuse-no            Social Determinants of Health   Financial Resource Strain: Low Risk  (05/31/2021)   Overall Financial Resource Strain (CARDIA)    Difficulty of Paying Living Expenses: Not hard at all  Food Insecurity: No Food Insecurity (05/31/2021)   Hunger Vital Sign    Worried About Running Out of Food in the Last Year: Never true    Ran Out of Food in the Last Year: Never true  Transportation Needs: No Transportation Needs (05/31/2021)   PRAPARE - Hydrologist (Medical): No    Lack of Transportation (Non-Medical): No  Physical Activity: Sufficiently Active (05/31/2021)   Exercise Vital Sign    Days of Exercise per Week: 4 days    Minutes of Exercise per Session: 60 min  Stress: No Stress Concern Present (05/31/2021)   [place]    Feeling of Stress : Only a little  Social Connections: Moderately Integrated (05/31/2021)   Social Connection and Isolation Panel [NHANES]    Frequency of Communication with Friends and Family: More than three times a week    Frequency of Social Gatherings with Friends and Family: More than three times a week    Attends Religious Services: More than 4 times per year    Active  Member of [employer] or Organizations: Yes    Attends Archivist Meetings: More than 4 times per year    Marital Status: Widowed  Intimate Partner Violence: Not At Risk (05/31/2021)   Humiliation, Afraid, Rape, and Kick questionnaire  Fear of Current or Ex-Partner: No    Emotionally Abused: No    Physically Abused: No    Sexually Abused: No    Outpatient Medications Prior to Visit  Medication Sig Dispense Refill   acetaminophen (TYLENOL) 650 MG CR tablet Take 1,300 mg by mouth every 8 (eight) hours as needed for pain.      aspirin EC 81 MG tablet Take 1 tablet (81 mg total) by mouth daily. 90 tablet 3   citalopram (CELEXA) 20 MG tablet Take 1 tablet (20 mg total) by mouth at bedtime. 90 tablet 1   Evolocumab (REPATHA SURECLICK) 093 MG/ML SOAJ Inject 1 mL into the skin every 14 (fourteen) days. 6 mL 3   metoprolol succinate (TOPROL-XL) 25 MG 24 hr tablet Take 2 tablets (50 mg total) by mouth in the morning AND 1 tablet (25 mg total) every evening. 270 tablet 3   ranolazine (RANEXA) 500 MG 12 hr tablet Take 1 tablet (500 mg total) by mouth 2 (two) times daily. 180 tablet 3   nitroGLYCERIN (NITROSTAT) 0.4 MG SL tablet Place 1 tablet (0.4 mg total) under the tongue every 5 (five) minutes as needed for chest pain. 25 tablet 3   No facility-administered medications prior to visit.    Allergies  Allergen Reactions   Atorvastatin    Rosuvastatin     '40mg'$ --Creatinine elevation    Review of Systems  Constitutional:  Negative for fever and malaise/fatigue.  HENT:  Negative for congestion.   Eyes:  Negative for blurred vision.  Respiratory:  Negative for shortness of breath.   Cardiovascular:  Negative for chest pain, palpitations and leg swelling.  Gastrointestinal:  Negative for abdominal pain, blood in stool and nausea.  Genitourinary:  Negative for dysuria and frequency.  Musculoskeletal:  Negative for falls.  Skin:  Negative for rash.  Neurological:  Negative for dizziness,  loss of consciousness and headaches.  Endo/Heme/Allergies:  Negative for environmental allergies.       (+)bruises on left leg  Psychiatric/Behavioral:  Negative for depression. The patient is not nervous/anxious.        Objective:    Physical Exam Vitals and nursing note reviewed.  Constitutional:      General: She is not in acute distress.    Appearance: Normal appearance. She is well-developed. She is not ill-appearing.  HENT:     Head: Normocephalic and atraumatic.     Right Ear: External ear normal.     Left Ear: External ear normal.  Eyes:     Extraocular Movements: Extraocular movements intact.     Conjunctiva/sclera: Conjunctivae normal.     Pupils: Pupils are equal, round, and reactive to light.  Neck:     Thyroid: No thyromegaly.     Vascular: No carotid bruit or JVD.  Cardiovascular:     Rate and Rhythm: Normal rate and regular rhythm.     Heart sounds: Normal heart sounds. No murmur heard.    No gallop.  Pulmonary:     Effort: Pulmonary effort is normal. No respiratory distress.     Breath sounds: Normal breath sounds. No wheezing or rales.  Chest:     Chest wall: No tenderness.  Musculoskeletal:     Cervical back: Normal range of motion and neck supple.     Comments: Brace on left hand  Skin:    General: Skin is warm and dry.  Neurological:     Mental Status: She is alert and oriented to person, place, and time.  Psychiatric:  Judgment: Judgment normal.     BP 114/70 (BP Location: Right Arm, Patient Position: Sitting, Cuff Size: Normal)   Pulse 64   Temp 97.8 F (36.6 C) (Oral)   Resp 18   Ht '5\' 6"'$  (1.676 m)   Wt 173 lb 12.8 oz (78.8 kg)   SpO2 98%   BMI 28.05 kg/m  Wt Readings from Last 3 Encounters:  12/02/21 173 lb 12.8 oz (78.8 kg)  11/22/21 169 lb (76.7 kg)  09/13/21 169 lb 9.6 oz (76.9 kg)    Diabetic Foot Exam - Simple   No data filed    Lab Results  Component Value Date   WBC 7.1 03/02/2021   HGB 12.6 03/02/2021   HCT  38.7 03/02/2021   PLT 277.0 03/02/2021   GLUCOSE 76 03/02/2021   CHOL 138 07/20/2021   TRIG 98 07/20/2021   HDL 62 07/20/2021   LDLCALC 58 07/20/2021   ALT 13 03/02/2021   AST 14 03/02/2021   NA 141 03/02/2021   K 4.0 03/02/2021   CL 105 03/02/2021   CREATININE 2.06 (H) 03/02/2021   BUN 30 (H) 03/02/2021   CO2 27 03/02/2021   TSH 1.54 08/05/2020   INR 1.17 12/27/2013   HGBA1C 5.9 08/27/2020    Lab Results  Component Value Date   TSH 1.54 08/05/2020   Lab Results  Component Value Date   WBC 7.1 03/02/2021   HGB 12.6 03/02/2021   HCT 38.7 03/02/2021   MCV 89.8 03/02/2021   PLT 277.0 03/02/2021   Lab Results  Component Value Date   NA 141 03/02/2021   K 4.0 03/02/2021   CO2 27 03/02/2021   GLUCOSE 76 03/02/2021   BUN 30 (H) 03/02/2021   CREATININE 2.06 (H) 03/02/2021   BILITOT 0.4 03/02/2021   ALKPHOS 79 03/02/2021   AST 14 03/02/2021   ALT 13 03/02/2021   PROT 6.5 03/02/2021   ALBUMIN 4.2 03/02/2021   CALCIUM 9.3 03/02/2021   ANIONGAP 11 12/27/2013   GFR 23.97 (L) 03/02/2021   Lab Results  Component Value Date   CHOL 138 07/20/2021   Lab Results  Component Value Date   HDL 62 07/20/2021   Lab Results  Component Value Date   LDLCALC 58 07/20/2021   Lab Results  Component Value Date   TRIG 98 07/20/2021   Lab Results  Component Value Date   CHOLHDL 2.2 07/20/2021   Lab Results  Component Value Date   HGBA1C 5.9 08/27/2020       Assessment & Plan:   Problem List Items Addressed This Visit       Unprioritized   Hyperlipidemia LDL goal <70    Encourage heart healthy diet such as MIND or DASH diet, increase exercise, avoid trans fats, simple carbohydrates and processed foods, consider a krill or fish or flaxseed oil cap daily.       Other Visit Diagnoses     Hyperlipidemia, unspecified hyperlipidemia type    -  Primary   Need for influenza vaccination       Relevant Orders   Flu Vaccine QUAD High Dose(Fluad) (Completed)         No orders of the defined types were placed in this encounter.   IAnn Held, DO, personally preformed the services described in this documentation.  All medical record entries made by the scribe were at my direction and in my presence.  I have reviewed the chart and discharge instructions (if applicable) and agree that the  record reflects my personal performance and is accurate and complete. 12/02/2021   I,Kelsey Church,acting as a scribe for Kelsey Held, DO.,have documented all relevant documentation on the behalf of Kelsey Held, DO,as directed by  Kelsey Held, DO while in the presence of Kelsey Held, DO.   Kelsey Held, DO

## 2021-12-06 ENCOUNTER — Ambulatory Visit: Payer: Medicare Other | Attending: Sports Medicine | Admitting: Physical Therapy

## 2021-12-06 ENCOUNTER — Encounter: Payer: Self-pay | Admitting: Physical Therapy

## 2021-12-06 DIAGNOSIS — M25561 Pain in right knee: Secondary | ICD-10-CM | POA: Insufficient documentation

## 2021-12-06 DIAGNOSIS — M25552 Pain in left hip: Secondary | ICD-10-CM | POA: Diagnosis not present

## 2021-12-06 DIAGNOSIS — M6281 Muscle weakness (generalized): Secondary | ICD-10-CM | POA: Diagnosis not present

## 2021-12-06 DIAGNOSIS — G8929 Other chronic pain: Secondary | ICD-10-CM | POA: Insufficient documentation

## 2021-12-06 DIAGNOSIS — M25551 Pain in right hip: Secondary | ICD-10-CM | POA: Diagnosis not present

## 2021-12-06 DIAGNOSIS — M25661 Stiffness of right knee, not elsewhere classified: Secondary | ICD-10-CM | POA: Insufficient documentation

## 2021-12-06 DIAGNOSIS — R2681 Unsteadiness on feet: Secondary | ICD-10-CM | POA: Diagnosis not present

## 2021-12-06 NOTE — Therapy (Signed)
OUTPATIENT PHYSICAL THERAPY LOWER EXTREMITY EVALUATION   Patient Name: Kelsey Church MRN: [id number] DOB:07/29/1950, 71 y.o., female Today's Date: 12/06/2021   PT End of Session - 12/06/21 1026     Visit Number 1    Number of Visits 12    Date for PT Re-Evaluation 01/17/22    Authorization Type Medicare + BCBS    Progress Note Due on Visit 10    PT Start Time 1015    PT Stop Time 1100    PT Time Calculation (min) 45 min    Activity Tolerance Patient tolerated treatment well    Behavior During Therapy WFL for tasks assessed/performed             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/10/13   Cancer ([place])    melanoma in 2 sights   Complication of anesthesia    "chills every evening at sundown x 2 weeks"- no fever   Depression    Dysphagia    History of kidney cancer    left   History of shingles    NO RESIDUAL PROBLEMS   Hypertension    Pain    LOWER BACK PAIN   Sleep apnea    CPAP   Past Surgical History:  Procedure Laterality Date   APPENDECTOMY  1958   COLONOSCOPY     COLONOSCOPY W/ POLYPECTOMY     7 polyps   ESOPHAGEAL MANOMETRY N/A 03/17/2021   Procedure: ESOPHAGEAL MANOMETRY (EM);  Surgeon: Jerene Bears, MD;  Location: [facility];  Service: Gastroenterology;  Laterality: N/A;   FOOT SURGERY Right    removal plantar wart   GALLBLADDER SURGERY  02/14/1990   INJECTION KNEE  09/2011   right    KNEE ARTHROSCOPY  1996   LEFT HEART CATH AND CORONARY ANGIOGRAPHY N/A 07/11/2019   Procedure: LEFT HEART CATH AND CORONARY ANGIOGRAPHY;  Surgeon: Belva Crome, MD;  Location: [facility];  Service: Cardiovascular;  Laterality: N/A;   MELANOMA EXCISION  1992   right chin, back   NEPHRECTOMY Left 2022   POLYPECTOMY     SALPINGOOPHORECTOMY  2009   Right   TOTAL KNEE ARTHROPLASTY Right 06/10/2013   Procedure: RIGHT TOTAL KNEE ARTHROPLASTY;  Surgeon: [name], MD;   Location: [facility];  Service: Orthopedics;  Laterality: Right;   TOTAL KNEE ARTHROPLASTY Left 12/16/2013   Procedure: LEFT TOTAL KNEE ARTHROPLASTY;  Surgeon: [name], MD;  Location: [facility];  Service: Orthopedics;  Laterality: Left;   Patient Active Problem List   Diagnosis Date Noted   COVID-19 07/27/2021   Hallucinations 05/06/2021   Viral URI 01/20/2021   Dysphagia    Hyperlipidemia LDL goal <70 12/07/2020   Atherosclerotic heart disease of native coronary artery with other forms of angina pectoris (HCC)    Symptomatic PVCs 12/26/2018   Stage 3a chronic kidney disease ([place]) 12/26/2018   Degenerative lumbar disc 11/01/2016   Gastroesophageal reflux disease with esophagitis 12/10/2014   Moderate osteopenia 12/10/2014   Vitamin B12 deficiency 12/10/2014   OA (osteoarthritis) of knee 12/16/2013   Prediabetes 12/03/2013   Other screening mammogram 09/25/2012   DJD (degenerative joint disease) of knee 09/09/2011   OSA (obstructive sleep apnea) 07/29/2011   Essential hypertension, benign 07/29/2011    PCP: Ann Held, DO  REFERRING PROVIDER: Glennon Mac, DO  REFERRING DIAG:  [phone] (ICD-10-CM) - Right hip pain  M25.561,G89.29 (ICD-10-CM) - Chronic pain of right knee    THERAPY DIAG:  Pain in right hip  Pain in left hip  Acute pain of right knee  Stiffness of right knee, not elsewhere classified  Muscle weakness (generalized)  Unsteadiness on feet  Rationale for Evaluation and Treatment Rehabilitation  ONSET DATE: fall on 11/19/2021   SUBJECTIVE:   SUBJECTIVE STATEMENT: Patient reports she was at a ladies group retreat in [place], she reached foward for a remote and then fell back when stood up, hit her L hand into door frame and fracture metacarpal in hand, and the fell onto R knee replacement knee then to R hip.  Has large hematoma on R hip and R leg.  Wearing a splint still on L hand.   She reports she stopped water aerobics got too cold.   She stepped hard down at the vet when missed a step, and L hip is a little achey as well.   PERTINENT HISTORY: gastritis and GERD,  nephrectomy 2022, history cancer, history bil TKR 2015, history cardiac disease, chronic L hip pain.  PAIN:  Are you having pain? Yes: NPRS scale: 8/10 Pain location: R hip Pain description: aching, bruised Aggravating factors: laying on R side, dog jumping Relieving factors: ice  Are you having pain? Yes: NPRS scale: 6/10 Pain location: R knee Pain description: swollen, more numbness, "not right" Aggravating factors: sitting too long, standing too long Relieving factors: ice  PRECAUTIONS: Fall  WEIGHT BEARING RESTRICTIONS: No  FALLS:  Has patient fallen in last 6 months? Yes. Number of falls 2, injury on 10/9, last Thursday at vets missed vets   LIVING ENVIRONMENT: Lives with: lives alone Lives in: House/apartment Stairs: No Has following equipment at home: None  OCCUPATION: retired   PLOF: Independent  PATIENT GOALS: improve pain, improve balance, build muscle   OBJECTIVE:   DIAGNOSTIC FINDINGS: 11/27/21 MR Hip Right  IMPRESSION: 1. Mild age related degenerative changes but no hip fracture or AVN. 2. Mild bilateral peritendinosis without trochanteric bursitis. 3. No significant intrapelvic abnormalities. 11/22/2021 DG Right knee  IMPRESSION: Total right knee replacement. Hardware intact. Anatomic alignment. Small amount of methylmethacrylate may be present in the popliteal space. No acute abnormality identified. 11/22/2021 DG Right Hip/Pelvis  IMPRESSION: 1. Diffuse osteopenia. Degenerative changes lumbar spine and both hips. Cortical irregularity noted of the right femoral neck on one view only. This may be related to old injury. To completely exclude a subtle acute right hip fracture MRI of the right hip suggested.   PATIENT SURVEYS:  LEFS 63/80 = 79% ability   COGNITION: Overall cognitive status: Within functional limits for  tasks assessed     SENSATION: Numbness R lateral knee s/p R TKA  EDEMA:  Slightly in R knee  MUSCLE LENGTH: Hamstrings: Right 90 deg; Left 90 deg   POSTURE: forward head  PALPATION: Tenderness/bruising R lateral calf, R hip, very tender to touch  LOWER EXTREMITY ROM:    Active ROM Right eval Left eval  Hip flexion    Hip extension    Hip abduction    Hip adduction    Hip internal rotation 15 0  Hip external rotation 45, Limited by pain 45  Knee flexion 100 110  Knee extension -10 0  Ankle dorsiflexion    Ankle plantarflexion     (Blank rows = not tested)  LOWER EXTREMITY MMT:  MMT Right eval Left eval  Hip flexion 4 4  Hip extension    Hip abduction 4+  4+  Hip adduction 5 5  Knee flexion    Knee extension 4+ 5  Ankle dorsiflexion 5 5  Ankle plantarflexion     (Blank rows = not tested)  LOWER EXTREMITY SPECIAL TESTS:  NT  FUNCTIONAL TESTS:  5 times sit to stand: 24 seconds, increased R knee pain  GAIT: Distance walked: 74' Assistive device utilized: None Level of assistance: Complete Independence Comments: decreased gait speed, slightly antlagic   TODAY'S TREATMENT                                                                          DATE: 12/06/2021 Modalities: [place] x 8 min to R calf 1MHz, 1.2 W/cm2 (large head) to decrease pain and improved blood flow.  Self Care: recommendations to try to avoid ice, gentle movement (ankle pumps, LAQ).   See patient education.     PATIENT EDUCATION:  Education details: findings, POC, initial exercises, PEACE v. RICE, information on [employer] aquatic program (arthritis certified pool).  Person educated: Patient Education method: Explanation and Demonstration Education comprehension: verbalized understanding  HOME EXERCISE PROGRAM: [id number]   ASSESSMENT:  CLINICAL IMPRESSION: Patient is a 71 y.o. female who was seen today for physical therapy evaluation and treatment for R hip and knee pain following fall on  11/22/21.  She has been in PT previously for hip pain and falls about 8 months ago, and reports she has not been as actively lately as she knows she should have been.  She demonstrates large hematoma on R hip and R knee/calf, R knee varus, decreased R knee ROM, and increased pain with movement.  Discussed importance of gentle movement to improve blood flow to improve healing, and applied [place] to R calf today, after which she reported decreased pain.  [name] would benefit from skilled physical therapy to decrease pain, improved LE strength, improve R knee ROM, and also to improve balance and decrease risk of further fall with injury.    OBJECTIVE IMPAIRMENTS: Abnormal gait, decreased balance, decreased endurance, difficulty walking, decreased ROM, decreased strength, decreased safety awareness, increased edema, increased fascial restrictions, increased muscle spasms, and pain.   ACTIVITY LIMITATIONS: bending, sitting, squatting, and sleeping  PARTICIPATION LIMITATIONS: meal prep  PERSONAL FACTORS: Fitness and 3+ comorbidities: chronic L hip pain, history cancer, bil TKR, osteopenia, nephrectomy  are also affecting patient's functional outcome.   REHAB POTENTIAL: Good  CLINICAL DECISION MAKING: Stable/uncomplicated  EVALUATION COMPLEXITY: Low   GOALS: Goals reviewed with patient? Yes  SHORT TERM GOALS: Target date: 12/20/2021   Patient will be independent with initial HEP. Baseline: given Goal status: INITIAL    LONG TERM GOALS: Target date: 01/17/2022   Patient will be independent with advanced/ongoing HEP to improve outcomes and carryover.  Baseline: needs advancement Goal status: INITIAL  2.  Patient will report at least 75% improvement in right hip and knee pain to improve QOL. Baseline: 8/10 R hip, 6/10 R knee Goal status: INITIAL  3.  Patient will demonstrate improved functional LE strength as demonstrated by 5x STS < 14 seconds to decrease fall risk. Baseline: 23 seconds with  increased R knee pain Goal status: INITIAL  4.  Patient will complete 1200' feet in 6 minutes with normal gait pattern without  increased pain to access community.  Baseline: NT Goal status: INITIAL  5.  Patient will report 9 points improvement on LEFS to demonstrate improved functional ability. Baseline: 63/80 Goal status: INITIAL  6.  Patient will demonstrate at least 19/24 on DGI to decrease risk of falls. Baseline: NT Goal status: INITIAL   7.  Patient will demonstrate full R knee ROM 0-110 without increased pain. Baseline: 10-100 Goal status: INITIAL    PLAN:  PT FREQUENCY: 1-2x/week  PT DURATION: 6 weeks  PLANNED INTERVENTIONS: Therapeutic exercises, Therapeutic activity, Neuromuscular re-education, Balance training, Gait training, Patient/Family education, Self Care, Joint mobilization, Stair training, Prosthetic training, Dry Needling, Spinal mobilization, Cryotherapy, Moist heat, Traction, Ionotophoresis '4mg'$ /ml Dexamethasone, Manual therapy, and Re-evaluation  PLAN FOR NEXT SESSION: 6MWT, continue [place] to R calf/R hip, gentle exercises as tolerated.    Rennie Natter, PT, DPT 12/06/2021, 11:25 AM

## 2021-12-08 ENCOUNTER — Ambulatory Visit: Payer: Medicare Other

## 2021-12-09 ENCOUNTER — Ambulatory Visit: Payer: Medicare Other

## 2021-12-10 DIAGNOSIS — S62338A Displaced fracture of neck of other metacarpal bone, initial encounter for closed fracture: Secondary | ICD-10-CM | POA: Diagnosis not present

## 2021-12-13 ENCOUNTER — Ambulatory Visit: Payer: Medicare Other

## 2021-12-14 DIAGNOSIS — N184 Chronic kidney disease, stage 4 (severe): Secondary | ICD-10-CM | POA: Diagnosis not present

## 2021-12-15 ENCOUNTER — Encounter: Payer: Self-pay | Admitting: Physical Therapy

## 2021-12-15 ENCOUNTER — Ambulatory Visit: Payer: Medicare Other | Attending: Sports Medicine | Admitting: Physical Therapy

## 2021-12-15 DIAGNOSIS — M25661 Stiffness of right knee, not elsewhere classified: Secondary | ICD-10-CM | POA: Insufficient documentation

## 2021-12-15 DIAGNOSIS — M6281 Muscle weakness (generalized): Secondary | ICD-10-CM | POA: Diagnosis not present

## 2021-12-15 DIAGNOSIS — M25552 Pain in left hip: Secondary | ICD-10-CM | POA: Insufficient documentation

## 2021-12-15 DIAGNOSIS — M25551 Pain in right hip: Secondary | ICD-10-CM | POA: Insufficient documentation

## 2021-12-15 DIAGNOSIS — M25561 Pain in right knee: Secondary | ICD-10-CM | POA: Insufficient documentation

## 2021-12-15 DIAGNOSIS — R2681 Unsteadiness on feet: Secondary | ICD-10-CM | POA: Insufficient documentation

## 2021-12-15 NOTE — Therapy (Signed)
OUTPATIENT PHYSICAL THERAPY LOWER EXTREMITY EVALUATION   Patient Name: Kelsey Church MRN: [id number] DOB:[birth year], 71 y.o., female Today's Date: 12/15/2021   PT End of Session - 12/15/21 1022     Visit Number 2    Number of Visits 12    Date for PT Re-Evaluation 01/17/22    Authorization Type Medicare + BCBS    Progress Note Due on Visit 10    PT Start Time 1018    PT Stop Time 1100    PT Time Calculation (min) 42 min    Activity Tolerance Patient tolerated treatment well    Behavior During Therapy WFL for tasks assessed/performed             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/10/13   Cancer ([place])    melanoma in 2 sights   Complication of anesthesia    "chills every evening at sundown x 2 weeks"- no fever   Depression    Dysphagia    History of kidney cancer    left   History of shingles    NO RESIDUAL PROBLEMS   Hypertension    Pain    LOWER BACK PAIN   Sleep apnea    CPAP   Past Surgical History:  Procedure Laterality Date   APPENDECTOMY  1958   COLONOSCOPY     COLONOSCOPY W/ POLYPECTOMY     7 polyps   ESOPHAGEAL MANOMETRY N/A 03/17/2021   Procedure: ESOPHAGEAL MANOMETRY (EM);  Surgeon: Jerene Bears, MD;  Location: [facility];  Service: Gastroenterology;  Laterality: N/A;   FOOT SURGERY Right    removal plantar wart   GALLBLADDER SURGERY  02/14/1990   INJECTION KNEE  09/2011   right    KNEE ARTHROSCOPY  1996   LEFT HEART CATH AND CORONARY ANGIOGRAPHY N/A 07/11/2019   Procedure: LEFT HEART CATH AND CORONARY ANGIOGRAPHY;  Surgeon: Belva Crome, MD;  Location: [place] [facility];  Service: Cardiovascular;  Laterality: N/A;   MELANOMA EXCISION  1992   right chin, back   NEPHRECTOMY Left 2022   POLYPECTOMY     SALPINGOOPHORECTOMY  2009   Right   TOTAL KNEE ARTHROPLASTY Right 06/10/2013   Procedure: RIGHT TOTAL KNEE ARTHROPLASTY;  Surgeon: [name], MD;   Location: [facility];  Service: Orthopedics;  Laterality: Right;   TOTAL KNEE ARTHROPLASTY Left 12/16/2013   Procedure: LEFT TOTAL KNEE ARTHROPLASTY;  Surgeon: [name], MD;  Location: [facility];  Service: Orthopedics;  Laterality: Left;   Patient Active Problem List   Diagnosis Date Noted   COVID-19 07/27/2021   Hallucinations 05/06/2021   Viral URI 01/20/2021   Dysphagia    Hyperlipidemia LDL goal <70 12/07/2020   Atherosclerotic heart disease of native coronary artery with other forms of angina pectoris (HCC)    Symptomatic PVCs 12/26/2018   Stage 3a chronic kidney disease ([place]) 12/26/2018   Degenerative lumbar disc 11/01/2016   Gastroesophageal reflux disease with esophagitis 12/10/2014   Moderate osteopenia 12/10/2014   Vitamin B12 deficiency 12/10/2014   OA (osteoarthritis) of knee 12/16/2013   Prediabetes 12/03/2013   Other screening mammogram 09/25/2012   DJD (degenerative joint disease) of knee 09/09/2011   OSA (obstructive sleep apnea) 07/29/2011   Essential hypertension, benign 07/29/2011    PCP: Ann Held, DO  REFERRING PROVIDER: Glennon Mac, DO  REFERRING DIAG:  [phone] (ICD-10-CM) - Right hip pain  M25.561,G89.29 (ICD-10-CM) - Chronic pain of right knee    THERAPY DIAG:  Pain in right hip  Pain in left hip  Acute pain of right knee  Stiffness of right knee, not elsewhere classified  Muscle weakness (generalized)  Unsteadiness on feet  Rationale for Evaluation and Treatment Rehabilitation  ONSET DATE: fall on 11/19/2021   SUBJECTIVE:   SUBJECTIVE STATEMENT: Patient reports hip is feeling better, but gets sharp burning in R knee but "no one is interested in it."  The bruising is improving, still a little sore when laying on it.   PERTINENT HISTORY: gastritis and GERD,  nephrectomy 2022, history cancer, history bil TKR 2015, history cardiac disease, chronic L hip pain.  PAIN:  Are you having pain? Yes: NPRS scale: 2/10 Pain  location: R hip Pain description: aching, bruised Aggravating factors: laying on R side, dog jumping Relieving factors: ice  Are you having pain? Yes: NPRS scale: 5/10 Pain location: R knee Pain description: burning  Aggravating factors: sitting too long, standing too long Relieving factors: ice  PRECAUTIONS: Fall  WEIGHT BEARING RESTRICTIONS: No  FALLS:  Has patient fallen in last 6 months? Yes. Number of falls 2, injury on 10/9, last Thursday at vets missed vets   LIVING ENVIRONMENT: Lives with: lives alone Lives in: House/apartment Stairs: No Has following equipment at home: None  OCCUPATION: retired   PLOF: Independent  PATIENT GOALS: improve pain, improve balance, build muscle   OBJECTIVE:   DIAGNOSTIC FINDINGS: 11/27/21 MR Hip Right  IMPRESSION: 1. Mild age related degenerative changes but no hip fracture or AVN. 2. Mild bilateral peritendinosis without trochanteric bursitis. 3. No significant intrapelvic abnormalities. 11/22/2021 DG Right knee  IMPRESSION: Total right knee replacement. Hardware intact. Anatomic alignment. Small amount of methylmethacrylate may be present in the popliteal space. No acute abnormality identified. 11/22/2021 DG Right Hip/Pelvis  IMPRESSION: 1. Diffuse osteopenia. Degenerative changes lumbar spine and both hips. Cortical irregularity noted of the right femoral neck on one view only. This may be related to old injury. To completely exclude a subtle acute right hip fracture MRI of the right hip suggested.   PATIENT SURVEYS:  LEFS 63/80 = 79% ability   COGNITION: Overall cognitive status: Within functional limits for tasks assessed     SENSATION: Numbness R lateral knee s/p R TKA  EDEMA:  Slightly in R knee  MUSCLE LENGTH: Hamstrings: Right 90 deg; Left 90 deg   POSTURE: forward head  PALPATION: Tenderness/bruising R lateral calf, R hip, very tender to touch  LOWER EXTREMITY ROM:    Active ROM Right eval Left eval   Hip flexion    Hip extension    Hip abduction    Hip adduction    Hip internal rotation 15 0  Hip external rotation 45, Limited by pain 45  Knee flexion 100 110  Knee extension -10 0  Ankle dorsiflexion    Ankle plantarflexion     (Blank rows = not tested)  LOWER EXTREMITY MMT:  MMT Right eval Left eval  Hip flexion 4 4  Hip extension    Hip abduction 4+ 4+  Hip adduction 5 5  Knee flexion    Knee extension 4+ 5  Ankle dorsiflexion 5 5  Ankle plantarflexion     (Blank rows = not tested)  LOWER EXTREMITY SPECIAL TESTS:  NT  FUNCTIONAL TESTS:  5 times sit to stand: 24 seconds, increased R knee pain  GAIT: Distance walked: 48' Assistive device utilized: None Level of assistance: Complete Independence  Comments: decreased gait speed, slightly antlagic   TODAY'S TREATMENT                                                                          DATE: 12/15/2021 Therapeutic Exercise: to improve strength and mobility.  Demo, verbal and tactile cues throughout for technique.  Bike L1 x 6 min  Gait x 995' Seated hip abduction step overs x 10 bil   Modalities: [place] x 5 min to R calf 1MHz, 1.2 W/cm2, 100%, [place] x 5 min to R lateral knee 3.3 MHz, 1.2 W/cm2, 100%  to decrease pain and improved blood flow.   12/06/2021 Modalities: [place] x 8 min to R calf 1MHz, 1.2 W/cm2 (large head) to decrease pain and improved blood flow.  Self Care: recommendations to try to avoid ice, gentle movement (ankle pumps, LAQ).   See patient education.     PATIENT EDUCATION:  Education details: seated hip hip flexor/adductor exercise Person educated: Patient Education method: Explanation and Demonstration Education comprehension: verbalized understanding  HOME EXERCISE PROGRAM: [email or web address]   ASSESSMENT:  CLINICAL IMPRESSION: Kelsey Church reports improvement in bruising and pain, and was able to complete 6MWT today without increased R knee or hip pain, completing 995'.  She reported sharp knees  in R knee and had obvious swelling R lateral tibial plateau along joint line, but she has had R knee replacement, so suspect inflammed bursa.  Trialed [place] to this area today to decrease inflammation.  Kelsey Church continues to demonstrate potential for improvement and would benefit from continued skilled therapy to address impairments.        OBJECTIVE IMPAIRMENTS: Abnormal gait, decreased balance, decreased endurance, difficulty walking, decreased ROM, decreased strength, decreased safety awareness, increased edema, increased fascial restrictions, increased muscle spasms, and pain.   ACTIVITY LIMITATIONS: bending, sitting, squatting, and sleeping  PARTICIPATION LIMITATIONS: meal prep  PERSONAL FACTORS: Fitness and 3+ comorbidities: chronic L hip pain, history cancer, bil TKR, osteopenia, nephrectomy  are also affecting patient's functional outcome.   REHAB POTENTIAL: Good  CLINICAL DECISION MAKING: Stable/uncomplicated  EVALUATION COMPLEXITY: Low   GOALS: Goals reviewed with patient? Yes  SHORT TERM GOALS: Target date: 12/20/2021   Patient will be independent with initial HEP. Baseline: given Goal status: IN PROGRESS    LONG TERM GOALS: Target date: 01/17/2022   Patient will be independent with advanced/ongoing HEP to improve outcomes and carryover.  Baseline: needs advancement Goal status: IN PROGRESS  2.  Patient will report at least 75% improvement in right hip and knee pain to improve QOL. Baseline: 8/10 R hip, 6/10 R knee Goal status: IN PROGRESS  3.  Patient will demonstrate improved functional LE strength as demonstrated by 5x STS < 14 seconds to decrease fall risk. Baseline: 23 seconds with increased R knee pain Goal status: IN PROGRESS  4.  Patient will complete 1200' feet in 6 minutes with normal gait pattern without increased pain to access community.  Baseline: 995' on 12/15/21 Goal status: IN PROGRESS  5.  Patient will report 9 points improvement on LEFS  to demonstrate improved functional ability. Baseline: 63/80 Goal status: IN PROGRESS  6.  Patient will demonstrate at least 19/24 on DGI to decrease risk of falls. Baseline:  NT Goal status: IN PROGRESS   7.  Patient will demonstrate full R knee ROM 0-110 without increased pain. Baseline: 10-100 Goal status: IN Progress    PLAN:  PT FREQUENCY: 1-2x/week  PT DURATION: 6 weeks  PLANNED INTERVENTIONS: Therapeutic exercises, Therapeutic activity, Neuromuscular re-education, Balance training, Gait training, Patient/Family education, Self Care, Joint mobilization, Stair training, Prosthetic training, Dry Needling, Spinal mobilization, Cryotherapy, Moist heat, Traction, Ionotophoresis '4mg'$ /ml Dexamethasone, Manual therapy, and Re-evaluation  PLAN FOR NEXT SESSION: continue to progress LE strengthening and modalities PRN.    Rennie Natter, PT, DPT 12/15/2021, 12:19 PM

## 2021-12-20 ENCOUNTER — Ambulatory Visit: Payer: Medicare Other | Admitting: Physical Therapy

## 2021-12-20 ENCOUNTER — Encounter: Payer: Self-pay | Admitting: Physical Therapy

## 2021-12-20 DIAGNOSIS — M6281 Muscle weakness (generalized): Secondary | ICD-10-CM

## 2021-12-20 DIAGNOSIS — M25561 Pain in right knee: Secondary | ICD-10-CM

## 2021-12-20 DIAGNOSIS — M79642 Pain in left hand: Secondary | ICD-10-CM | POA: Diagnosis not present

## 2021-12-20 DIAGNOSIS — M25661 Stiffness of right knee, not elsewhere classified: Secondary | ICD-10-CM | POA: Diagnosis not present

## 2021-12-20 DIAGNOSIS — R2681 Unsteadiness on feet: Secondary | ICD-10-CM

## 2021-12-20 DIAGNOSIS — M25551 Pain in right hip: Secondary | ICD-10-CM | POA: Diagnosis not present

## 2021-12-20 DIAGNOSIS — M25552 Pain in left hip: Secondary | ICD-10-CM | POA: Diagnosis not present

## 2021-12-20 NOTE — Therapy (Signed)
OUTPATIENT PHYSICAL THERAPY LOWER EXTREMITY EVALUATION   Patient Name: Kelsey Church MRN: [id number] DOB:[birth year], 71 y.o., female Today's Date: 12/20/2021   PT End of Session - 12/20/21 1018     Visit Number 3    Number of Visits 12    Date for PT Re-Evaluation 01/17/22    Authorization Type Medicare + BCBS    Progress Note Due on Visit 10    PT Start Time 1016    PT Stop Time 1102    PT Time Calculation (min) 46 min    Activity Tolerance Patient tolerated treatment well    Behavior During Therapy WFL for tasks assessed/performed             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/10/13   Cancer ([place])    melanoma in 2 sights   Complication of anesthesia    "chills every evening at sundown x 2 weeks"- no fever   Depression    Dysphagia    History of kidney cancer    left   History of shingles    NO RESIDUAL PROBLEMS   Hypertension    Pain    LOWER BACK PAIN   Sleep apnea    CPAP   Past Surgical History:  Procedure Laterality Date   APPENDECTOMY  1958   COLONOSCOPY     COLONOSCOPY W/ POLYPECTOMY     7 polyps   ESOPHAGEAL MANOMETRY N/A 03/17/2021   Procedure: ESOPHAGEAL MANOMETRY (EM);  Surgeon: Jerene Bears, MD;  Location: [facility];  Service: Gastroenterology;  Laterality: N/A;   FOOT SURGERY Right    removal plantar wart   GALLBLADDER SURGERY  02/14/1990   INJECTION KNEE  09/2011   right    KNEE ARTHROSCOPY  1996   LEFT HEART CATH AND CORONARY ANGIOGRAPHY N/A 07/11/2019   Procedure: LEFT HEART CATH AND CORONARY ANGIOGRAPHY;  Surgeon: Belva Crome, MD;  Location: [place] [facility];  Service: Cardiovascular;  Laterality: N/A;   MELANOMA EXCISION  1992   right chin, back   NEPHRECTOMY Left 2022   POLYPECTOMY     SALPINGOOPHORECTOMY  2009   Right   TOTAL KNEE ARTHROPLASTY Right 06/10/2013   Procedure: RIGHT TOTAL KNEE ARTHROPLASTY;  Surgeon: [name], MD;   Location: [facility];  Service: Orthopedics;  Laterality: Right;   TOTAL KNEE ARTHROPLASTY Left 12/16/2013   Procedure: LEFT TOTAL KNEE ARTHROPLASTY;  Surgeon: [name], MD;  Location: [facility];  Service: Orthopedics;  Laterality: Left;   Patient Active Problem List   Diagnosis Date Noted   COVID-19 07/27/2021   Hallucinations 05/06/2021   Viral URI 01/20/2021   Dysphagia    Hyperlipidemia LDL goal <70 12/07/2020   Atherosclerotic heart disease of native coronary artery with other forms of angina pectoris (HCC)    Symptomatic PVCs 12/26/2018   Stage 3a chronic kidney disease ([place]) 12/26/2018   Degenerative lumbar disc 11/01/2016   Gastroesophageal reflux disease with esophagitis 12/10/2014   Moderate osteopenia 12/10/2014   Vitamin B12 deficiency 12/10/2014   OA (osteoarthritis) of knee 12/16/2013   Prediabetes 12/03/2013   Other screening mammogram 09/25/2012   DJD (degenerative joint disease) of knee 09/09/2011   OSA (obstructive sleep apnea) 07/29/2011   Essential hypertension, benign 07/29/2011    PCP: Ann Held, DO  REFERRING PROVIDER: Glennon Mac, DO  REFERRING DIAG:  [phone] (ICD-10-CM) - Right hip pain  M25.561,G89.29 (ICD-10-CM) - Chronic pain of right knee    THERAPY DIAG:  Pain in right hip  Pain in left hip  Acute pain of right knee  Stiffness of right knee, not elsewhere classified  Muscle weakness (generalized)  Unsteadiness on feet  Rationale for Evaluation and Treatment Rehabilitation  ONSET DATE: fall on 11/19/2021   SUBJECTIVE:   SUBJECTIVE STATEMENT: Patient reports  no hip pain today, just sore in R lower leg still.  Supposed to start hand therapy today.  Denies falls.   PERTINENT HISTORY: gastritis and GERD,  nephrectomy 2022, history cancer, history bil TKR 2015, history cardiac disease, chronic L hip pain.  PAIN:  Are you having pain? Yes: NPRS scale: 0/10 Pain location: R lower leg Pain description: sore,  bruised Aggravating factors: laying on R side, dog jumping Relieving factors: ice  Are you having pain? Yes: NPRS scale: 5/10 Pain location: R knee Pain description: burning  Aggravating factors: sitting too long, standing too long Relieving factors: ice  PRECAUTIONS: Fall  WEIGHT BEARING RESTRICTIONS: No  FALLS:  Has patient fallen in last 6 months? Yes. Number of falls 2, injury on 10/9, last Thursday at vets missed vets   LIVING ENVIRONMENT: Lives with: lives alone Lives in: House/apartment Stairs: No Has following equipment at home: None  OCCUPATION: retired   PLOF: Independent  PATIENT GOALS: improve pain, improve balance, build muscle   OBJECTIVE:   DIAGNOSTIC FINDINGS: 11/27/21 MR Hip Right  IMPRESSION: 1. Mild age related degenerative changes but no hip fracture or AVN. 2. Mild bilateral peritendinosis without trochanteric bursitis. 3. No significant intrapelvic abnormalities. 11/22/2021 DG Right knee  IMPRESSION: Total right knee replacement. Hardware intact. Anatomic alignment. Small amount of methylmethacrylate may be present in the popliteal space. No acute abnormality identified. 11/22/2021 DG Right Hip/Pelvis  IMPRESSION: 1. Diffuse osteopenia. Degenerative changes lumbar spine and both hips. Cortical irregularity noted of the right femoral neck on one view only. This may be related to old injury. To completely exclude a subtle acute right hip fracture MRI of the right hip suggested.   PATIENT SURVEYS:  LEFS 63/80 = 79% ability   COGNITION: Overall cognitive status: Within functional limits for tasks assessed     SENSATION: Numbness R lateral knee s/p R TKA  EDEMA:  Slightly in R knee  MUSCLE LENGTH: Hamstrings: Right 90 deg; Left 90 deg   POSTURE: forward head  PALPATION: Tenderness/bruising R lateral calf, R hip, very tender to touch  LOWER EXTREMITY ROM:    Active ROM Right eval Left eval  Hip flexion    Hip extension    Hip  abduction    Hip adduction    Hip internal rotation 15 0  Hip external rotation 45, Limited by pain 45  Knee flexion 100 110  Knee extension -10 0  Ankle dorsiflexion    Ankle plantarflexion     (Blank rows = not tested)  LOWER EXTREMITY MMT:  MMT Right eval Left eval  Hip flexion 4 4  Hip extension    Hip abduction 4+ 4+  Hip adduction 5 5  Knee flexion    Knee extension 4+ 5  Ankle dorsiflexion 5 5  Ankle plantarflexion     (Blank rows = not tested)  LOWER EXTREMITY SPECIAL TESTS:  NT  FUNCTIONAL TESTS:  5 times sit to stand: 24 seconds, increased R knee pain  GAIT: Distance walked: 43' Assistive device utilized: None Level of assistance: Complete Independence Comments: decreased gait speed, slightly antlagic  TODAY'S TREATMENT                                                                          DATE: 12/20/2021 Therapeutic Exercise: to improve strength and mobility.  Demo, verbal and tactile cues throughout for technique. Bike L2 x 6 min Bridges 3 x 10  Supine clams RTB 2 x 10 Clamshells 2 x 10 (pillow under R hip)   Modalities: [place] x 5 min to R calf 1MHz, 1.2 W/cm2, 100%, [place] x 5 min to R lateral knee 1.0 MHz, 1.2 W/cm2, 50%  to decrease pain and improved blood flow.   12/15/2021 Therapeutic Exercise: to improve strength and mobility.  Demo, verbal and tactile cues throughout for technique.  Bike L1 x 6 min  Gait x 995' Seated hip abduction step overs x 10 bil   Modalities: [place] x 5 min to R calf 1MHz, 1.2 W/cm2, 100%, [place] x 5 min to R lateral knee 3.3 MHz, 1.2 W/cm2, 100%  to decrease pain and improved blood flow.   12/06/2021 Modalities: [place] x 8 min to R calf 1MHz, 1.2 W/cm2 (large head) to decrease pain and improved blood flow.  Self Care: recommendations to try to avoid ice, gentle movement (ankle pumps, LAQ).   See patient education.     PATIENT EDUCATION:  Education details: HEP update 11/6 Person educated: Patient Education method: Explanation and  Demonstration Education comprehension: verbalized understanding  HOME EXERCISE PROGRAM: Access Code: [email or web address] URL: [email or web address] Date: 12/20/2021 Prepared by: Glenetta Hew  Exercises Added - Supine Bridge  - 1 x daily - 7 x weekly - 3 sets - 10 reps - Hooklying Clamshell with Resistance  - 1 x daily - 7 x weekly - 3 sets - 10 reps - Clamshell  - 1 x daily - 7 x weekly - 3 sets - 10 reps  ASSESSMENT:  CLINICAL IMPRESSION: Kelsey Church continues to report improvement, bruising has improved significantly, still tender R calf where bruising was worst, and still has large effusion over R lateral knee.  Tolerated progression of exercises well except for clamshells on mat table, too much pressure on hips, improved when pillow placed under hip.  Updated HEP and issued RTB.  Reminded her about [employer] arthritis certified pool since she was inquiring about aquatic therapy. [place] to R leg again to continue to improve swelling and pain.  Kelsey Church continues to demonstrate potential for improvement and would benefit from continued skilled therapy to address impairments.        OBJECTIVE IMPAIRMENTS: Abnormal gait, decreased balance, decreased endurance, difficulty walking, decreased ROM, decreased strength, decreased safety awareness, increased edema, increased fascial restrictions, increased muscle spasms, and pain.   ACTIVITY LIMITATIONS: bending, sitting, squatting, and sleeping  PARTICIPATION LIMITATIONS: meal prep  PERSONAL FACTORS: Fitness and 3+ comorbidities: chronic L hip pain, history cancer, bil TKR, osteopenia, nephrectomy  are also affecting patient's functional outcome.   REHAB POTENTIAL: Good  CLINICAL DECISION MAKING: Stable/uncomplicated  EVALUATION COMPLEXITY: Low   GOALS: Goals reviewed with patient? Yes  SHORT TERM GOALS: Target date: 12/20/2021   Patient will be independent with initial HEP. Baseline: given Goal status: MET 12/20/21 -  met for current, updated.     LONG  TERM GOALS: Target date: 01/17/2022   Patient will be independent with advanced/ongoing HEP to improve outcomes and carryover.  Baseline: needs advancement Goal status: IN PROGRESS  2.  Patient will report at least 75% improvement in right hip and knee pain to improve QOL. Baseline: 8/10 R hip, 6/10 R knee Goal status: IN PROGRESS  3.  Patient will demonstrate improved functional LE strength as demonstrated by 5x STS < 14 seconds to decrease fall risk. Baseline: 23 seconds with increased R knee pain Goal status: IN PROGRESS  4.  Patient will complete 1200' feet in 6 minutes with normal gait pattern without increased pain to access community.  Baseline: 995' on 12/15/21 Goal status: IN PROGRESS  5.  Patient will report 9 points improvement on LEFS to demonstrate improved functional ability. Baseline: 63/80 Goal status: IN PROGRESS  6.  Patient will demonstrate at least 19/24 on DGI to decrease risk of falls. Baseline: NT Goal status: IN PROGRESS   7.  Patient will demonstrate full R knee ROM 0-110 without increased pain. Baseline: 10-100 Goal status: IN Progress    PLAN:  PT FREQUENCY: 1-2x/week  PT DURATION: 6 weeks  PLANNED INTERVENTIONS: Therapeutic exercises, Therapeutic activity, Neuromuscular re-education, Balance training, Gait training, Patient/Family education, Self Care, Joint mobilization, Stair training, Prosthetic training, Dry Needling, Spinal mobilization, Cryotherapy, Moist heat, Traction, Ionotophoresis 11m/ml Dexamethasone, Manual therapy, and Re-evaluation  PLAN FOR NEXT SESSION: continue to progress LE strengthening and modalities PRN.    ERennie Natter PT, DPT 12/20/2021, 11:18 AM

## 2021-12-21 DIAGNOSIS — R1310 Dysphagia, unspecified: Secondary | ICD-10-CM | POA: Diagnosis not present

## 2021-12-21 DIAGNOSIS — I2510 Atherosclerotic heart disease of native coronary artery without angina pectoris: Secondary | ICD-10-CM | POA: Diagnosis not present

## 2021-12-21 DIAGNOSIS — C642 Malignant neoplasm of left kidney, except renal pelvis: Secondary | ICD-10-CM | POA: Diagnosis not present

## 2021-12-21 DIAGNOSIS — Z905 Acquired absence of kidney: Secondary | ICD-10-CM | POA: Diagnosis not present

## 2021-12-21 DIAGNOSIS — R441 Visual hallucinations: Secondary | ICD-10-CM | POA: Diagnosis not present

## 2021-12-21 DIAGNOSIS — D631 Anemia in chronic kidney disease: Secondary | ICD-10-CM | POA: Diagnosis not present

## 2021-12-21 DIAGNOSIS — N184 Chronic kidney disease, stage 4 (severe): Secondary | ICD-10-CM | POA: Diagnosis not present

## 2021-12-22 ENCOUNTER — Ambulatory Visit: Payer: Medicare Other

## 2021-12-22 DIAGNOSIS — M25552 Pain in left hip: Secondary | ICD-10-CM

## 2021-12-22 DIAGNOSIS — M25561 Pain in right knee: Secondary | ICD-10-CM | POA: Diagnosis not present

## 2021-12-22 DIAGNOSIS — R2681 Unsteadiness on feet: Secondary | ICD-10-CM | POA: Diagnosis not present

## 2021-12-22 DIAGNOSIS — M25551 Pain in right hip: Secondary | ICD-10-CM | POA: Diagnosis not present

## 2021-12-22 DIAGNOSIS — M25661 Stiffness of right knee, not elsewhere classified: Secondary | ICD-10-CM

## 2021-12-22 DIAGNOSIS — M6281 Muscle weakness (generalized): Secondary | ICD-10-CM | POA: Diagnosis not present

## 2021-12-22 NOTE — Therapy (Signed)
OUTPATIENT PHYSICAL THERAPY TREATMENT   Patient Name: Kelsey Church MRN: [id number] DOB:06/01/1950, 71 y.o., female Today's Date: 12/22/2021     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/10/13   Cancer ([place])    melanoma in 2 sights   Complication of anesthesia    "chills every evening at sundown x 2 weeks"- no fever   Depression    Dysphagia    History of kidney cancer    left   History of shingles    NO RESIDUAL PROBLEMS   Hypertension    Pain    LOWER BACK PAIN   Sleep apnea    CPAP   Past Surgical History:  Procedure Laterality Date   APPENDECTOMY  1958   COLONOSCOPY     COLONOSCOPY W/ POLYPECTOMY     7 polyps   ESOPHAGEAL MANOMETRY N/A 03/17/2021   Procedure: ESOPHAGEAL MANOMETRY (EM);  Surgeon: Jerene Bears, MD;  Location: [facility];  Service: Gastroenterology;  Laterality: N/A;   FOOT SURGERY Right    removal plantar wart   GALLBLADDER SURGERY  02/14/1990   INJECTION KNEE  09/2011   right    KNEE ARTHROSCOPY  1996   LEFT HEART CATH AND CORONARY ANGIOGRAPHY N/A 07/11/2019   Procedure: LEFT HEART CATH AND CORONARY ANGIOGRAPHY;  Surgeon: Belva Crome, MD;  Location: [facility];  Service: Cardiovascular;  Laterality: N/A;   MELANOMA EXCISION  1992   right chin, back   NEPHRECTOMY Left 2022   POLYPECTOMY     SALPINGOOPHORECTOMY  2009   Right   TOTAL KNEE ARTHROPLASTY Right 06/10/2013   Procedure: RIGHT TOTAL KNEE ARTHROPLASTY;  Surgeon: [name], MD;  Location: [facility];  Service: Orthopedics;  Laterality: Right;   TOTAL KNEE ARTHROPLASTY Left 12/16/2013   Procedure: LEFT TOTAL KNEE ARTHROPLASTY;  Surgeon: [name], MD;  Location: [facility];  Service: Orthopedics;  Laterality: Left;   Patient Active Problem List   Diagnosis Date Noted   COVID-19 07/27/2021   Hallucinations 05/06/2021   Viral URI 01/20/2021   Dysphagia    Hyperlipidemia LDL goal <70 12/07/2020    Atherosclerotic heart disease of native coronary artery with other forms of angina pectoris (HCC)    Symptomatic PVCs 12/26/2018   Stage 3a chronic kidney disease ([place]) 12/26/2018   Degenerative lumbar disc 11/01/2016   Gastroesophageal reflux disease with esophagitis 12/10/2014   Moderate osteopenia 12/10/2014   Vitamin B12 deficiency 12/10/2014   OA (osteoarthritis) of knee 12/16/2013   Prediabetes 12/03/2013   Other screening mammogram 09/25/2012   DJD (degenerative joint disease) of knee 09/09/2011   OSA (obstructive sleep apnea) 07/29/2011   Essential hypertension, benign 07/29/2011    PCP: Ann Held, DO  REFERRING PROVIDER: Glennon Mac, DO  REFERRING DIAG:  S85.462 (ICD-10-CM) - Right hip pain  M25.561,G89.29 (ICD-10-CM) - Chronic pain of right knee    THERAPY DIAG:  Pain in right hip  Pain in left hip  Acute pain of right knee  Stiffness of right knee, not elsewhere classified  Muscle weakness (generalized)  Unsteadiness on feet  Rationale for Evaluation and Treatment Rehabilitation  ONSET DATE: fall on 11/19/2021   SUBJECTIVE:   SUBJECTIVE STATEMENT: Pt denies pain today.  PERTINENT HISTORY: gastritis and GERD,  nephrectomy 2022, history cancer, history bil TKR 2015, history cardiac disease, chronic L hip pain.  PAIN:  Are you having pain? Yes: NPRS  scale: 0/10 Pain location: R lower leg Pain description: sore, bruised Aggravating factors: laying on R side, dog jumping Relieving factors: ice  Are you having pain? No: NPRS scale: 0/10 Pain location: R knee Pain description: burning  Aggravating factors: sitting too long, standing too long Relieving factors: ice  PRECAUTIONS: Fall  WEIGHT BEARING RESTRICTIONS: No  FALLS:  Has patient fallen in last 6 months? Yes. Number of falls 2, injury on 10/9, last Thursday at vets missed vets   LIVING ENVIRONMENT: Lives with: lives alone Lives in: House/apartment Stairs: No Has  following equipment at home: None  OCCUPATION: retired   PLOF: Independent  PATIENT GOALS: improve pain, improve balance, build muscle   OBJECTIVE:   DIAGNOSTIC FINDINGS: 11/27/21 MR Hip Right  IMPRESSION: 1. Mild age related degenerative changes but no hip fracture or AVN. 2. Mild bilateral peritendinosis without trochanteric bursitis. 3. No significant intrapelvic abnormalities. 11/22/2021 DG Right knee  IMPRESSION: Total right knee replacement. Hardware intact. Anatomic alignment. Small amount of methylmethacrylate may be present in the popliteal space. No acute abnormality identified. 11/22/2021 DG Right Hip/Pelvis  IMPRESSION: 1. Diffuse osteopenia. Degenerative changes lumbar spine and both hips. Cortical irregularity noted of the right femoral neck on one view only. This may be related to old injury. To completely exclude a subtle acute right hip fracture MRI of the right hip suggested.   PATIENT SURVEYS:  LEFS 63/80 = 79% ability   COGNITION: Overall cognitive status: Within functional limits for tasks assessed     SENSATION: Numbness R lateral knee s/p R TKA  EDEMA:  Slightly in R knee  MUSCLE LENGTH: Hamstrings: Right 90 deg; Left 90 deg   POSTURE: forward head  PALPATION: Tenderness/bruising R lateral calf, R hip, very tender to touch  LOWER EXTREMITY ROM:    Active ROM Right eval Left eval  Hip flexion    Hip extension    Hip abduction    Hip adduction    Hip internal rotation 15 0  Hip external rotation 45, Limited by pain 45  Knee flexion 100 110  Knee extension -10 0  Ankle dorsiflexion    Ankle plantarflexion     (Blank rows = not tested)  LOWER EXTREMITY MMT:  MMT Right eval Left eval  Hip flexion 4 4  Hip extension    Hip abduction 4+ 4+  Hip adduction 5 5  Knee flexion    Knee extension 4+ 5  Ankle dorsiflexion 5 5  Ankle plantarflexion     (Blank rows = not tested)  LOWER EXTREMITY SPECIAL TESTS:  NT  FUNCTIONAL  TESTS:  5 times sit to stand: 24 seconds, increased R knee pain  GAIT: Distance walked: 20' Assistive device utilized: None Level of assistance: Complete Independence Comments: decreased gait speed, slightly antlagic   TODAY'S TREATMENT                                                                          DATE: 12/22/21 Therapeutic Exercise: to improve strength and mobility.  Demo, verbal and tactile cues throughout for technique. Bike L2 x 8 min Seated gastroc stretch 2x30  Heel raise 2x10 Hip hikes supported against wall 2x10 R/L Glute sets on wall x 10 - 5  sec hold Bridges with TrA activation 2x10  12/20/2021 Therapeutic Exercise: to improve strength and mobility.  Demo, verbal and tactile cues throughout for technique. Bike L2 x 6 min Bridges 3 x 10  Supine clams RTB 2 x 10 Clamshells 2 x 10 (pillow under R hip)   Modalities: [place] x 5 min to R calf 1MHz, 1.2 W/cm2, 100%, [place] x 5 min to R lateral knee 1.0 MHz, 1.2 W/cm2, 50%  to decrease pain and improved blood flow.   12/15/2021 Therapeutic Exercise: to improve strength and mobility.  Demo, verbal and tactile cues throughout for technique.  Bike L1 x 6 min  Gait x 995' Seated hip abduction step overs x 10 bil   Modalities: [place] x 5 min to R calf 1MHz, 1.2 W/cm2, 100%, [place] x 5 min to R lateral knee 3.3 MHz, 1.2 W/cm2, 100%  to decrease pain and improved blood flow.   12/06/2021 Modalities: [place] x 8 min to R calf 1MHz, 1.2 W/cm2 (large head) to decrease pain and improved blood flow.  Self Care: recommendations to try to avoid ice, gentle movement (ankle pumps, LAQ).   See patient education.     PATIENT EDUCATION:  Education details: HEP update 11/6 Person educated: Patient Education method: Explanation and Demonstration Education comprehension: verbalized understanding  HOME EXERCISE PROGRAM: Access Code: [email or web address] URL: [email or web address] Date: 12/20/2021 Prepared by: Glenetta Hew  Exercises Added -  Supine Bridge  - 1 x daily - 7 x weekly - 3 sets - 10 reps - Hooklying Clamshell with Resistance  - 1 x daily - 7 x weekly - 3 sets - 10 reps - Clamshell  - 1 x daily - 7 x weekly - 3 sets - 10 reps  ASSESSMENT:  CLINICAL IMPRESSION: [name] arrived late today. We continued to focus on proximal LE strengthening today. She was challenged with the hip hiking exercise. No reports of pain or issues with the R calf or lateral knee today. Would continue progression of proximal strengthening and stabilization to improve function.      OBJECTIVE IMPAIRMENTS: Abnormal gait, decreased balance, decreased endurance, difficulty walking, decreased ROM, decreased strength, decreased safety awareness, increased edema, increased fascial restrictions, increased muscle spasms, and pain.   ACTIVITY LIMITATIONS: bending, sitting, squatting, and sleeping  PARTICIPATION LIMITATIONS: meal prep  PERSONAL FACTORS: Fitness and 3+ comorbidities: chronic L hip pain, history cancer, bil TKR, osteopenia, nephrectomy  are also affecting patient's functional outcome.   REHAB POTENTIAL: Good  CLINICAL DECISION MAKING: Stable/uncomplicated  EVALUATION COMPLEXITY: Low   GOALS: Goals reviewed with patient? Yes  SHORT TERM GOALS: Target date: 12/20/2021   Patient will be independent with initial HEP. Baseline: given Goal status: MET 12/20/21 - met for current, updated.     LONG TERM GOALS: Target date: 01/17/2022   Patient will be independent with advanced/ongoing HEP to improve outcomes and carryover.  Baseline: needs advancement Goal status: IN PROGRESS  2.  Patient will report at least 75% improvement in right hip and knee pain to improve QOL. Baseline: 8/10 R hip, 6/10 R knee Goal status: IN PROGRESS  3.  Patient will demonstrate improved functional LE strength as demonstrated by 5x STS < 14 seconds to decrease fall risk. Baseline: 23 seconds with increased R knee pain Goal status: IN PROGRESS  4.  Patient  will complete 1200' feet in 6 minutes with normal gait pattern without increased pain to access community.  Baseline: 995' on 12/15/21 Goal status: IN PROGRESS  5.  Patient will report 71  points improvement on LEFS to demonstrate improved functional ability. Baseline: 63/80 Goal status: IN PROGRESS  6.  Patient will demonstrate at least 19/24 on DGI to decrease risk of falls. Baseline: NT Goal status: IN PROGRESS   7.  Patient will demonstrate full R knee ROM 0-110 without increased pain. Baseline: 10-100 Goal status: IN Progress    PLAN:  PT FREQUENCY: 1-2x/week  PT DURATION: 6 weeks  PLANNED INTERVENTIONS: Therapeutic exercises, Therapeutic activity, Neuromuscular re-education, Balance training, Gait training, Patient/Family education, Self Care, Joint mobilization, Stair training, Prosthetic training, Dry Needling, Spinal mobilization, Cryotherapy, Moist heat, Traction, Ionotophoresis 18m/ml Dexamethasone, Manual therapy, and Re-evaluation  PLAN FOR NEXT SESSION: continue to progress LE strengthening and modalities PRN.    BArtist Pais PTA 12/22/2021, 11:12 AM

## 2021-12-24 DIAGNOSIS — S62338A Displaced fracture of neck of other metacarpal bone, initial encounter for closed fracture: Secondary | ICD-10-CM | POA: Diagnosis not present

## 2021-12-27 ENCOUNTER — Ambulatory Visit: Payer: Medicare Other | Admitting: Physical Therapy

## 2021-12-27 ENCOUNTER — Encounter: Payer: Self-pay | Admitting: Physical Therapy

## 2021-12-27 DIAGNOSIS — R2681 Unsteadiness on feet: Secondary | ICD-10-CM | POA: Diagnosis not present

## 2021-12-27 DIAGNOSIS — M25552 Pain in left hip: Secondary | ICD-10-CM

## 2021-12-27 DIAGNOSIS — M25551 Pain in right hip: Secondary | ICD-10-CM | POA: Diagnosis not present

## 2021-12-27 DIAGNOSIS — M25661 Stiffness of right knee, not elsewhere classified: Secondary | ICD-10-CM

## 2021-12-27 DIAGNOSIS — M6281 Muscle weakness (generalized): Secondary | ICD-10-CM

## 2021-12-27 DIAGNOSIS — M25561 Pain in right knee: Secondary | ICD-10-CM

## 2021-12-27 NOTE — Therapy (Signed)
OUTPATIENT PHYSICAL THERAPY TREATMENT   Patient Name: Kelsey Church MRN: [id number] DOB:1950/05/15, 71 y.o., female Today's Date: 12/27/2021   PT End of Session - 12/27/21 1023     Visit Number 5    Number of Visits 12    Date for PT Re-Evaluation 01/17/22    Authorization Type Medicare + BCBS    Progress Note Due on Visit 10    PT Start Time 1020    PT Stop Time 1100    PT Time Calculation (min) 40 min    Activity Tolerance Patient tolerated treatment well    Behavior During Therapy WFL for tasks assessed/performed              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/10/13   Cancer ([place])    melanoma in 2 sights   Complication of anesthesia    "chills every evening at sundown x 2 weeks"- no fever   Depression    Dysphagia    History of kidney cancer    left   History of shingles    NO RESIDUAL PROBLEMS   Hypertension    Pain    LOWER BACK PAIN   Sleep apnea    CPAP   Past Surgical History:  Procedure Laterality Date   APPENDECTOMY  1958   COLONOSCOPY     COLONOSCOPY W/ POLYPECTOMY     7 polyps   ESOPHAGEAL MANOMETRY N/A 03/17/2021   Procedure: ESOPHAGEAL MANOMETRY (EM);  Surgeon: Jerene Bears, MD;  [facility];  Service: Gastroenterology;  Laterality: N/A;   FOOT SURGERY Right    removal plantar wart   GALLBLADDER SURGERY  02/14/1990   INJECTION KNEE  09/2011   right    KNEE ARTHROSCOPY  1996   LEFT HEART CATH AND CORONARY ANGIOGRAPHY N/A 07/11/2019   Procedure: LEFT HEART CATH AND CORONARY ANGIOGRAPHY;  Surgeon: Belva Crome, MD;  Location: [facility];  Service: Cardiovascular;  Laterality: N/A;   MELANOMA EXCISION  1992   right chin, back   NEPHRECTOMY Left 2022   POLYPECTOMY     SALPINGOOPHORECTOMY  2009   Right   TOTAL KNEE ARTHROPLASTY Right 06/10/2013   Procedure: RIGHT TOTAL KNEE ARTHROPLASTY;  Surgeon: [name], MD;  [facility]: [facility];   Service: Orthopedics;  Laterality: Right;   TOTAL KNEE ARTHROPLASTY Left 12/16/2013   Procedure: LEFT TOTAL KNEE ARTHROPLASTY;  Surgeon: [name], MD;  [facility]: [facility];  Service: Orthopedics;  Laterality: Left;   Patient Active Problem List   Diagnosis Date Noted   COVID-19 07/27/2021   Hallucinations 05/06/2021   Viral URI 01/20/2021   Dysphagia    Hyperlipidemia LDL goal <70 12/07/2020   Atherosclerotic heart disease of native coronary artery with other forms of angina pectoris (HCC)    Symptomatic PVCs 12/26/2018   Stage 3a chronic kidney disease ([place]) 12/26/2018   Degenerative lumbar disc 11/01/2016   Gastroesophageal reflux disease with esophagitis 12/10/2014   Moderate osteopenia 12/10/2014   Vitamin B12 deficiency 12/10/2014   OA (osteoarthritis) of knee 12/16/2013   Prediabetes 12/03/2013   Other screening mammogram 09/25/2012   DJD (degenerative joint disease) of knee 09/09/2011   OSA (obstructive sleep apnea) 07/29/2011   Essential hypertension, benign 07/29/2011    PCP: Ann Held, DO  REFERRING PROVIDER: Glennon Mac, DO  REFERRING DIAG:  [phone] (ICD-10-CM) - Right hip pain  M25.561,G89.29 (  ICD-10-CM) - Chronic pain of right knee    THERAPY DIAG:  Pain in right hip  Pain in left hip  Acute pain of right knee  Stiffness of right knee, not elsewhere classified  Muscle weakness (generalized)  Rationale for Evaluation and Treatment Rehabilitation  ONSET DATE: fall on 11/19/2021   SUBJECTIVE:   SUBJECTIVE STATEMENT: Kelsey Church reported increased bil knee pain on Saturday, had to take 2 tylenol, thinks it was her shoes.  Today just stiff in both knees.   Thinks its the weather.  Bruising has improved, just a tiny bit of soreness down leg, but still numb.   PERTINENT HISTORY: gastritis and GERD,  nephrectomy 2022, history cancer, history bil TKR 2015, history cardiac disease, chronic L hip pain.  PAIN:  Are you having pain?  Yes: NPRS scale: 0/10 Pain location: R lower leg Pain description: sore, bruised Aggravating factors: laying on R side, dog jumping Relieving factors: ice  Are you having pain? No: NPRS scale: 0/10 Pain location: R knee Pain description: burning  Aggravating factors: sitting too long, standing too long Relieving factors: ice  PRECAUTIONS: Fall  WEIGHT BEARING RESTRICTIONS: No  FALLS:  Has patient fallen in last 6 months? Yes. Number of falls 2, injury on 10/9, last Thursday at vets missed vets   LIVING ENVIRONMENT: Lives with: lives alone Lives in: House/apartment Stairs: No Has following equipment at home: None  OCCUPATION: retired   PLOF: Independent  PATIENT GOALS: improve pain, improve balance, build muscle   OBJECTIVE:   DIAGNOSTIC FINDINGS: 11/27/21 MR Hip Right  IMPRESSION: 1. Mild age related degenerative changes but no hip fracture or AVN. 2. Mild bilateral peritendinosis without trochanteric bursitis. 3. No significant intrapelvic abnormalities. 11/22/2021 DG Right knee  IMPRESSION: Total right knee replacement. Hardware intact. Anatomic alignment. Small amount of methylmethacrylate may be present in the popliteal space. No acute abnormality identified. 11/22/2021 DG Right Hip/Pelvis  IMPRESSION: 1. Diffuse osteopenia. Degenerative changes lumbar spine and both hips. Cortical irregularity noted of the right femoral neck on one view only. This may be related to old injury. To completely exclude a subtle acute right hip fracture MRI of the right hip suggested.   PATIENT SURVEYS:  LEFS 63/80 = 79% ability   COGNITION: Overall cognitive status: Within functional limits for tasks assessed     SENSATION: Numbness R lateral knee s/p R TKA  EDEMA:  Slightly in R knee  MUSCLE LENGTH: Hamstrings: Right 90 deg; Left 90 deg   POSTURE: forward head  PALPATION: Tenderness/bruising R lateral calf, R hip, very tender to touch  LOWER EXTREMITY ROM:     Active ROM Right eval Left eval  Hip flexion    Hip extension    Hip abduction    Hip adduction    Hip internal rotation 15 0  Hip external rotation 45, Limited by pain 45  Knee flexion 100 110  Knee extension -10 0  Ankle dorsiflexion    Ankle plantarflexion     (Blank rows = not tested)  LOWER EXTREMITY MMT:  MMT Right eval Left eval  Hip flexion 4 4  Hip extension    Hip abduction 4+ 4+  Hip adduction 5 5  Knee flexion    Knee extension 4+ 5  Ankle dorsiflexion 5 5  Ankle plantarflexion     (Blank rows = not tested)  LOWER EXTREMITY SPECIAL TESTS:  NT  FUNCTIONAL TESTS:  5 times sit to stand: 24 seconds, increased R knee pain  GAIT: Distance  walked: 41' Assistive device utilized: None Level of assistance: Complete Independence Comments: decreased gait speed, slightly antalgic   TODAY'S TREATMENT                                                                          DATE: 12/27/2021 Therapeutic Exercise: to improve strength and mobility.  Demo, verbal and tactile cues throughout for technique. Bike L2 x  7 min Step ups (1 riser) 2 x 10 bil  Side step ups (1 riser) 2 x 10 bil  Step down - challenging Stepping up and over riser - leading with toe not heel Heel taps on dots - forward 2 x 10 bil Side taps 2 x 10 bil - cues to keep weight over stance leg Backwards taps 2 x 10 bil  Standing hip hikes on step x 10 bil   12/22/21 Therapeutic Exercise: to improve strength and mobility.  Demo, verbal and tactile cues throughout for technique. Bike L2 x 8 min Seated gastroc stretch 2x30  Heel raise 2x10 Hip hikes supported against wall 2x10 R/L Glute sets on wall x 10 - 5 sec hold Bridges with TrA activation 2x10  12/20/2021 Therapeutic Exercise: to improve strength and mobility.  Demo, verbal and tactile cues throughout for technique. Bike L2 x 6 min Bridges 3 x 10  Supine clams RTB 2 x 10 Clamshells 2 x 10 (pillow under R hip)   Modalities: [place] x 5 min  to R calf 1MHz, 1.2 W/cm2, 100%, [place] x 5 min to R lateral knee 1.0 MHz, 1.2 W/cm2, 50%  to decrease pain and improved blood flow.   12/15/2021 Therapeutic Exercise: to improve strength and mobility.  Demo, verbal and tactile cues throughout for technique.  Bike L1 x 6 min  Gait x 995' Seated hip abduction step overs x 10 bil   Modalities: [place] x 5 min to R calf 1MHz, 1.2 W/cm2, 100%, [place] x 5 min to R lateral knee 3.3 MHz, 1.2 W/cm2, 100%  to decrease pain and improved blood flow.   12/06/2021 Modalities: [place] x 8 min to R calf 1MHz, 1.2 W/cm2 (large head) to decrease pain and improved blood flow.  Self Care: recommendations to try to avoid ice, gentle movement (ankle pumps, LAQ).   See patient education.     PATIENT EDUCATION:  Education details: HEP update 11/6, 11/13 Person educated: Patient Education method: Explanation and Demonstration Education comprehension: verbalized understanding  HOME EXERCISE PROGRAM: Access Code: [email or web address] URL: [email or web address] Date: 12/27/2021 Prepared by: Glenetta Hew  Exercises Added - Single Leg Balance with Clock Reach  - 1 x daily - 7 x weekly - 3 sets - 10 reps  ASSESSMENT:  CLINICAL IMPRESSION: Continue working on proximal hip strengthening today with [name], as she reports her knee and hip pain is improving.  During step exercises she reported that one of the reasons she fell is because she tends to step down with her toes rather than her heel, not clearing this up completely.  We then worked on heel taps on the floor for eccentric quad strengthening, also added clock exercises to side and back to encourage single-leg stance and more dynamic hip strengthening.  Added clock exercises to HEP, with instruction to tablet heel  when going forward.  Also worked on hip hiking exercise, this was very difficult.  She is having more left hip tightness, dry needling to this area helped a lot during the previous episode, so we will consider this  at next appointment.  Also plan to examine feet as she reports all curling of toes also impacting her balance.  [facility] Endo continues to demonstrate potential for improvement and would benefit from continued skilled therapy to address impairments.         OBJECTIVE IMPAIRMENTS: Abnormal gait, decreased balance, decreased endurance, difficulty walking, decreased ROM, decreased strength, decreased safety awareness, increased edema, increased fascial restrictions, increased muscle spasms, and pain.   ACTIVITY LIMITATIONS: bending, sitting, squatting, and sleeping  PARTICIPATION LIMITATIONS: meal prep  PERSONAL FACTORS: Fitness and 3+ comorbidities: chronic L hip pain, history cancer, bil TKR, osteopenia, nephrectomy  are also affecting patient's functional outcome.   REHAB POTENTIAL: Good  CLINICAL DECISION MAKING: Stable/uncomplicated  EVALUATION COMPLEXITY: Low   GOALS: Goals reviewed with patient? Yes  SHORT TERM GOALS: Target date: 12/20/2021   Patient will be independent with initial HEP. Baseline: given Goal status: MET 12/20/21 - met for current, updated.     LONG TERM GOALS: Target date: 01/17/2022   Patient will be independent with advanced/ongoing HEP to improve outcomes and carryover.  Baseline: needs advancement Goal status: IN PROGRESS  2.  Patient will report at least 75% improvement in right hip and knee pain to improve QOL. Baseline: 8/10 R hip, 6/10 R knee Goal status: IN PROGRESS  3.  Patient will demonstrate improved functional LE strength as demonstrated by 5x STS < 14 seconds to decrease fall risk. Baseline: 23 seconds with increased R knee pain Goal status: IN PROGRESS  4.  Patient will complete 1200' feet in 6 minutes with normal gait pattern without increased pain to access community.  Baseline: 995' on 12/15/21 Goal status: IN PROGRESS  5.  Patient will report 9 points improvement on LEFS to demonstrate improved functional ability. Baseline:  63/80 Goal status: IN PROGRESS  6.  Patient will demonstrate at least 19/24 on DGI to decrease risk of falls. Baseline: NT Goal status: IN PROGRESS   7.  Patient will demonstrate full R knee ROM 0-110 without increased pain. Baseline: 10-100 Goal status: IN Progress    PLAN:  PT FREQUENCY: 1-2x/week  PT DURATION: 6 weeks  PLANNED INTERVENTIONS: Therapeutic exercises, Therapeutic activity, Neuromuscular re-education, Balance training, Gait training, Patient/Family education, Self Care, Joint mobilization, Stair training, Prosthetic training, Dry Needling, Spinal mobilization, Cryotherapy, Moist heat, Traction, Ionotophoresis 87m/ml Dexamethasone, Manual therapy, and Re-evaluation  PLAN FOR NEXT SESSION: continue to progress LE strengthening and modalities PRN.   Balance, steps, consider DN to L hip.  Also examine feet.    ERennie Natter PT, DPT  12/27/2021, 11:09 AM

## 2021-12-29 DIAGNOSIS — M79642 Pain in left hand: Secondary | ICD-10-CM | POA: Diagnosis not present

## 2021-12-30 ENCOUNTER — Ambulatory Visit: Payer: Medicare Other | Admitting: Physical Therapy

## 2021-12-30 ENCOUNTER — Encounter: Payer: Self-pay | Admitting: Physical Therapy

## 2021-12-30 DIAGNOSIS — M25551 Pain in right hip: Secondary | ICD-10-CM | POA: Diagnosis not present

## 2021-12-30 DIAGNOSIS — M25661 Stiffness of right knee, not elsewhere classified: Secondary | ICD-10-CM

## 2021-12-30 DIAGNOSIS — M6281 Muscle weakness (generalized): Secondary | ICD-10-CM

## 2021-12-30 DIAGNOSIS — M25561 Pain in right knee: Secondary | ICD-10-CM | POA: Diagnosis not present

## 2021-12-30 DIAGNOSIS — M25552 Pain in left hip: Secondary | ICD-10-CM

## 2021-12-30 DIAGNOSIS — R2681 Unsteadiness on feet: Secondary | ICD-10-CM | POA: Diagnosis not present

## 2021-12-30 NOTE — Therapy (Signed)
OUTPATIENT PHYSICAL THERAPY TREATMENT   Patient Name: Kelsey Church MRN: [id number] DOB:03-05-1950, 71 y.o., female Today's Date: 12/30/2021   PT End of Session - 12/30/21 1531     Visit Number 6    Number of Visits 12    Date for PT Re-Evaluation 01/17/22    Authorization Type Medicare + BCBS    Progress Note Due on Visit 10    PT Start Time 1531    PT Stop Time 1615    PT Time Calculation (min) 44 min    Activity Tolerance Patient tolerated treatment well    Behavior During Therapy WFL for tasks assessed/performed              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/10/13   Cancer ([place])    melanoma in 2 sights   Complication of anesthesia    "chills every evening at sundown x 2 weeks"- no fever   Depression    Dysphagia    History of kidney cancer    left   History of shingles    NO RESIDUAL PROBLEMS   Hypertension    Pain    LOWER BACK PAIN   Sleep apnea    CPAP   Past Surgical History:  Procedure Laterality Date   APPENDECTOMY  1958   COLONOSCOPY     COLONOSCOPY W/ POLYPECTOMY     7 polyps   ESOPHAGEAL MANOMETRY N/A 03/17/2021   Procedure: ESOPHAGEAL MANOMETRY (EM);  Surgeon: Jerene Bears, MD;  Location: [facility];  Service: Gastroenterology;  Laterality: N/A;   FOOT SURGERY Right    removal plantar wart   GALLBLADDER SURGERY  02/14/1990   INJECTION KNEE  09/2011   right    KNEE ARTHROSCOPY  1996   LEFT HEART CATH AND CORONARY ANGIOGRAPHY N/A 07/11/2019   Procedure: LEFT HEART CATH AND CORONARY ANGIOGRAPHY;  Surgeon: Belva Crome, MD;  Location: [facility];  Service: Cardiovascular;  Laterality: N/A;   MELANOMA EXCISION  1992   right chin, back   NEPHRECTOMY Left 2022   POLYPECTOMY     SALPINGOOPHORECTOMY  2009   Right   TOTAL KNEE ARTHROPLASTY Right 06/10/2013   Procedure: RIGHT TOTAL KNEE ARTHROPLASTY;  Surgeon: [name], MD;  Location: [facility];   Service: Orthopedics;  Laterality: Right;   TOTAL KNEE ARTHROPLASTY Left 12/16/2013   Procedure: LEFT TOTAL KNEE ARTHROPLASTY;  Surgeon: [name], MD;  Location: [facility];  Service: Orthopedics;  Laterality: Left;   Patient Active Problem List   Diagnosis Date Noted   COVID-19 07/27/2021   Hallucinations 05/06/2021   Viral URI 01/20/2021   Dysphagia    Hyperlipidemia LDL goal <70 12/07/2020   Atherosclerotic heart disease of native coronary artery with other forms of angina pectoris (HCC)    Symptomatic PVCs 12/26/2018   Stage 3a chronic kidney disease ([place]) 12/26/2018   Degenerative lumbar disc 11/01/2016   Gastroesophageal reflux disease with esophagitis 12/10/2014   Moderate osteopenia 12/10/2014   Vitamin B12 deficiency 12/10/2014   OA (osteoarthritis) of knee 12/16/2013   Prediabetes 12/03/2013   Other screening mammogram 09/25/2012   DJD (degenerative joint disease) of knee 09/09/2011   OSA (obstructive sleep apnea) 07/29/2011   Essential hypertension, benign 07/29/2011    PCP: Ann Held, DO  REFERRING PROVIDER: Glennon Mac, DO  REFERRING DIAG:  [phone] (ICD-10-CM) - Right hip pain  M25.561,G89.29 (  ICD-10-CM) - Chronic pain of right knee    THERAPY DIAG:  Pain in right hip  Pain in left hip  Acute pain of right knee  Stiffness of right knee, not elsewhere classified  Muscle weakness (generalized)  Unsteadiness on feet  Rationale for Evaluation and Treatment Rehabilitation  ONSET DATE: fall on 11/19/2021   SUBJECTIVE:   SUBJECTIVE STATEMENT: Nishi H Greaves feeling pretty good, but mostly been sitting today.  Denies falls or LOB. R leg feels good, still feels numb on outside of R knee which worries her, "but no one else seems concerned"     PERTINENT HISTORY: gastritis and GERD,  nephrectomy 2022, history cancer, history bil TKR 2015, history cardiac disease, chronic L hip pain.  PAIN:  Are you having pain? Yes: NPRS scale:  0/10 Pain location: R lower leg Pain description: sore, bruised Aggravating factors: laying on R side, dog jumping Relieving factors: ice  Are you having pain? No: NPRS scale: 0/10 Pain location: R knee Pain description: burning  Aggravating factors: sitting too long, standing too long Relieving factors: ice  PRECAUTIONS: Fall  WEIGHT BEARING RESTRICTIONS: No  FALLS:  Has patient fallen in last 6 months? Yes. Number of falls 2, injury on 10/9, last Thursday at vets missed vets   LIVING ENVIRONMENT: Lives with: lives alone Lives in: House/apartment Stairs: No Has following equipment at home: None  OCCUPATION: retired   PLOF: Independent  PATIENT GOALS: improve pain, improve balance, build muscle   OBJECTIVE:   DIAGNOSTIC FINDINGS: 11/27/21 MR Hip Right  IMPRESSION: 1. Mild age related degenerative changes but no hip fracture or AVN. 2. Mild bilateral peritendinosis without trochanteric bursitis. 3. No significant intrapelvic abnormalities. 11/22/2021 DG Right knee  IMPRESSION: Total right knee replacement. Hardware intact. Anatomic alignment. Small amount of methylmethacrylate may be present in the popliteal space. No acute abnormality identified. 11/22/2021 DG Right Hip/Pelvis  IMPRESSION: 1. Diffuse osteopenia. Degenerative changes lumbar spine and both hips. Cortical irregularity noted of the right femoral neck on one view only. This may be related to old injury. To completely exclude a subtle acute right hip fracture MRI of the right hip suggested.   PATIENT SURVEYS:  LEFS 63/80 = 79% ability   COGNITION: Overall cognitive status: Within functional limits for tasks assessed     SENSATION: Numbness R lateral knee s/p R TKA  EDEMA:  Slightly in R knee  MUSCLE LENGTH: Hamstrings: Right 90 deg; Left 90 deg   POSTURE: forward head  PALPATION: Tenderness/bruising R lateral calf, R hip, very tender to touch  LOWER EXTREMITY ROM:    Active ROM  Right eval Left eval  Hip flexion    Hip extension    Hip abduction    Hip adduction    Hip internal rotation 15 0  Hip external rotation 45, Limited by pain 45  Knee flexion 100 110  Knee extension -10 0  Ankle dorsiflexion    Ankle plantarflexion     (Blank rows = not tested)  LOWER EXTREMITY MMT:  MMT Right eval Left eval  Hip flexion 4 4  Hip extension    Hip abduction 4+ 4+  Hip adduction 5 5  Knee flexion    Knee extension 4+ 5  Ankle dorsiflexion 5 5  Ankle plantarflexion     (Blank rows = not tested)  LOWER EXTREMITY SPECIAL TESTS:  NT  FUNCTIONAL TESTS:  5 times sit to stand: 24 seconds, increased R knee pain  GAIT: Distance walked: 69' Assistive device utilized:  None Level of assistance: Complete Independence Comments: decreased gait speed, slightly antalgic   TODAY'S TREATMENT                                                                          DATE: 12/30/2021 Therapeutic Exercise: to improve strength and mobility.  Demo, verbal and tactile cues throughout for technique. Bike L2 x 6 min  Ankle circles Ankle alphabet BAPS board level 2- DF/PF x 20, Inv/EVER x 20, CCW and CW circles x 10 Seated piriformis stretches bil Star excursion pattern taps 180 deg x 5 bil  Manual Therapy: to decrease muscle spasm and pain and improve mobility STM/TPR to L piriformis/L glut med, skilled palpation and monitoring during dry needling. Trigger Point Dry-Needling  Treatment instructions: Expect mild to moderate muscle soreness. S/S of pneumothorax if dry needled over a lung field, and to seek immediate medical attention should they occur. Patient verbalized understanding of these instructions and education. Patient Consent Given: Yes Education handout provided: Previously provided Muscles treated: L piriformis, L glut med Electrical stimulation performed: No Parameters: N/A Treatment response/outcome: Twitch Response Elicited and Palpable Increase in Muscle  Length   12/27/2021 Therapeutic Exercise: to improve strength and mobility.  Demo, verbal and tactile cues throughout for technique. Bike L2 x  7 min Step ups (1 riser) 2 x 10 bil  Side step ups (1 riser) 2 x 10 bil  Step down - challenging Stepping up and over riser - leading with toe not heel Heel taps on dots - forward 2 x 10 bil Side taps 2 x 10 bil - cues to keep weight over stance leg Backwards taps 2 x 10 bil  Standing hip hikes on step x 10 bil   12/22/21 Therapeutic Exercise: to improve strength and mobility.  Demo, verbal and tactile cues throughout for technique. Bike L2 x 8 min Seated gastroc stretch 2x30  Heel raise 2x10 Hip hikes supported against wall 2x10 R/L Glute sets on wall x 10 - 5 sec hold Bridges with TrA activation 2x10   PATIENT EDUCATION:  Education details: HEP update 11/6, 11/13, 11/16 for ankle exercises.  Person educated: Patient Education method: Explanation and Demonstration Education comprehension: verbalized understanding  HOME EXERCISE PROGRAM: Access Code: [email or web address] URL: [email or web address] Date: 12/27/2021 Prepared by: Glenetta Hew  Exercises Added - Single Leg Balance with Clock Reach  - 1 x daily - 7 x weekly - 3 sets - 10 reps  ASSESSMENT:  CLINICAL IMPRESSION: [name] continues to report numbness over R lateral knee, discussed she may have damaged peroneal nerve in fall, but may take up to 6 months to recover.  She does have decreased R ankle eversion strength, so worked on ankle strengthening exercises today and added to HEP.  She did report having easier time with star excursion pattern feeling as if those muscles were working better.  Also performed manual therapy to L hip (history of L hip bursitis) due to increased discomfort on L as RLE improves.  Tolerated well.   Oluwakemi H Mcgriff continues to demonstrate potential for improvement and would benefit from continued skilled therapy to address impairments.          OBJECTIVE IMPAIRMENTS: Abnormal gait, decreased balance, decreased endurance, difficulty walking,  decreased ROM, decreased strength, decreased safety awareness, increased edema, increased fascial restrictions, increased muscle spasms, and pain.   ACTIVITY LIMITATIONS: bending, sitting, squatting, and sleeping  PARTICIPATION LIMITATIONS: meal prep  PERSONAL FACTORS: Fitness and 3+ comorbidities: chronic L hip pain, history cancer, bil TKR, osteopenia, nephrectomy  are also affecting patient's functional outcome.   REHAB POTENTIAL: Good  CLINICAL DECISION MAKING: Stable/uncomplicated  EVALUATION COMPLEXITY: Low   GOALS: Goals reviewed with patient? Yes  SHORT TERM GOALS: Target date: 12/20/2021   Patient will be independent with initial HEP. Baseline: given Goal status: MET 12/20/21 - met for current, updated.     LONG TERM GOALS: Target date: 01/17/2022   Patient will be independent with advanced/ongoing HEP to improve outcomes and carryover.  Baseline: needs advancement Goal status: IN PROGRESS  2.  Patient will report at least 75% improvement in right hip and knee pain to improve QOL. Baseline: 8/10 R hip, 6/10 R knee Goal status: IN PROGRESS  3.  Patient will demonstrate improved functional LE strength as demonstrated by 5x STS < 14 seconds to decrease fall risk. Baseline: 23 seconds with increased R knee pain Goal status: IN PROGRESS  4.  Patient will complete 1200' feet in 6 minutes with normal gait pattern without increased pain to access community.  Baseline: 995' on 12/15/21 Goal status: IN PROGRESS  5.  Patient will report 9 points improvement on LEFS to demonstrate improved functional ability. Baseline: 63/80 Goal status: IN PROGRESS  6.  Patient will demonstrate at least 19/24 on DGI to decrease risk of falls. Baseline: NT Goal status: IN PROGRESS   7.  Patient will demonstrate full R knee ROM 0-110 without increased pain. Baseline: 10-100 Goal status:  IN Progress    PLAN:  PT FREQUENCY: 1-2x/week  PT DURATION: 6 weeks  PLANNED INTERVENTIONS: Therapeutic exercises, Therapeutic activity, Neuromuscular re-education, Balance training, Gait training, Patient/Family education, Self Care, Joint mobilization, Stair training, Prosthetic training, Dry Needling, Spinal mobilization, Cryotherapy, Moist heat, Traction, Ionotophoresis 38m/ml Dexamethasone, Manual therapy, and Re-evaluation  PLAN FOR NEXT SESSION: continue to progress LE strengthening and modalities PRN.   Balance, steps.  ERennie Natter PT, DPT  12/30/2021, 6:15 PM

## 2022-01-03 ENCOUNTER — Ambulatory Visit: Payer: Medicare Other | Admitting: Physical Therapy

## 2022-01-03 DIAGNOSIS — M79642 Pain in left hand: Secondary | ICD-10-CM | POA: Diagnosis not present

## 2022-01-10 ENCOUNTER — Encounter: Payer: Medicare Other | Admitting: Physical Therapy

## 2022-01-12 ENCOUNTER — Encounter: Payer: Medicare Other | Admitting: Physical Therapy

## 2022-01-14 ENCOUNTER — Encounter: Payer: Self-pay | Admitting: Physical Therapy

## 2022-01-14 ENCOUNTER — Ambulatory Visit: Payer: Medicare Other | Attending: Sports Medicine | Admitting: Physical Therapy

## 2022-01-14 DIAGNOSIS — M25551 Pain in right hip: Secondary | ICD-10-CM | POA: Diagnosis not present

## 2022-01-14 DIAGNOSIS — R2681 Unsteadiness on feet: Secondary | ICD-10-CM | POA: Diagnosis not present

## 2022-01-14 DIAGNOSIS — M25561 Pain in right knee: Secondary | ICD-10-CM | POA: Diagnosis not present

## 2022-01-14 DIAGNOSIS — S62338A Displaced fracture of neck of other metacarpal bone, initial encounter for closed fracture: Secondary | ICD-10-CM | POA: Diagnosis not present

## 2022-01-14 DIAGNOSIS — M25552 Pain in left hip: Secondary | ICD-10-CM | POA: Diagnosis not present

## 2022-01-14 DIAGNOSIS — M6281 Muscle weakness (generalized): Secondary | ICD-10-CM | POA: Insufficient documentation

## 2022-01-14 DIAGNOSIS — M25661 Stiffness of right knee, not elsewhere classified: Secondary | ICD-10-CM | POA: Insufficient documentation

## 2022-01-14 NOTE — Therapy (Signed)
OUTPATIENT PHYSICAL THERAPY TREATMENT   Patient Name: Kelsey Church MRN: [id number] DOB:Feb 07, 1951, 70 y.o., female Today's Date: 01/14/2022   PT End of Session - 01/14/22 0806     Visit Number 7    Number of Visits 12    Date for PT Re-Evaluation 01/17/22    Authorization Type Medicare + BCBS    Progress Note Due on Visit 10    PT Start Time 0804    PT Stop Time 0849    PT Time Calculation (min) 45 min    Activity Tolerance Patient tolerated treatment well    Behavior During Therapy WFL for tasks assessed/performed              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/10/13   Cancer ([place])    melanoma in 2 sights   Complication of anesthesia    "chills every evening at sundown x 2 weeks"- no fever   Depression    Dysphagia    History of kidney cancer    left   History of shingles    NO RESIDUAL PROBLEMS   Hypertension    Pain    LOWER BACK PAIN   Sleep apnea    CPAP   Past Surgical History:  Procedure Laterality Date   APPENDECTOMY  1958   COLONOSCOPY     COLONOSCOPY W/ POLYPECTOMY     7 polyps   ESOPHAGEAL MANOMETRY N/A 03/17/2021   Procedure: ESOPHAGEAL MANOMETRY (EM);  Surgeon: Jerene Bears, MD;  Location: [facility];  Service: Gastroenterology;  Laterality: N/A;   FOOT SURGERY Right    removal plantar wart   GALLBLADDER SURGERY  02/14/1990   INJECTION KNEE  09/2011   right    KNEE ARTHROSCOPY  1996   LEFT HEART CATH AND CORONARY ANGIOGRAPHY N/A 07/11/2019   Procedure: LEFT HEART CATH AND CORONARY ANGIOGRAPHY;  Surgeon: Belva Crome, MD;  Location: [facility];  Service: Cardiovascular;  Laterality: N/A;   MELANOMA EXCISION  1992   right chin, back   NEPHRECTOMY Left 2022   POLYPECTOMY     SALPINGOOPHORECTOMY  2009   Right   TOTAL KNEE ARTHROPLASTY Right 06/10/2013   Procedure: RIGHT TOTAL KNEE ARTHROPLASTY;  Surgeon: [name], MD;  Location: [facility];   Service: Orthopedics;  Laterality: Right;   TOTAL KNEE ARTHROPLASTY Left 12/16/2013   Procedure: LEFT TOTAL KNEE ARTHROPLASTY;  Surgeon: [name], MD;  Location: [facility];  Service: Orthopedics;  Laterality: Left;   Patient Active Problem List   Diagnosis Date Noted   COVID-19 07/27/2021   Hallucinations 05/06/2021   Viral URI 01/20/2021   Dysphagia    Hyperlipidemia LDL goal <70 12/07/2020   Atherosclerotic heart disease of native coronary artery with other forms of angina pectoris (HCC)    Symptomatic PVCs 12/26/2018   Stage 3a chronic kidney disease ([place]) 12/26/2018   Degenerative lumbar disc 11/01/2016   Gastroesophageal reflux disease with esophagitis 12/10/2014   Moderate osteopenia 12/10/2014   Vitamin B12 deficiency 12/10/2014   OA (osteoarthritis) of knee 12/16/2013   Prediabetes 12/03/2013   Other screening mammogram 09/25/2012   DJD (degenerative joint disease) of knee 09/09/2011   OSA (obstructive sleep apnea) 07/29/2011   Essential hypertension, benign 07/29/2011    PCP: Ann Held, DO  REFERRING PROVIDER: Glennon Mac, DO  REFERRING DIAG:  [phone] (ICD-10-CM) - Right hip pain  M25.561,G89.29 (  ICD-10-CM) - Chronic pain of right knee    THERAPY DIAG:  Pain in right hip  Pain in left hip  Acute pain of right knee  Stiffness of right knee, not elsewhere classified  Muscle weakness (generalized)  Unsteadiness on feet  Rationale for Evaluation and Treatment Rehabilitation  ONSET DATE: fall on 11/19/2021   SUBJECTIVE:   SUBJECTIVE STATEMENT: Kelsey Church   reports hip is doing well.  No pain.  No falls or LOB.   A little discomfort over thanksgiving but sleeping in different bed and wearing different shoes.    PERTINENT HISTORY: gastritis and GERD,  nephrectomy 2022, history cancer, history bil TKR 2015, history cardiac disease, chronic L hip pain.  PAIN:  Are you having pain? Yes: NPRS scale: 0/10 Pain location: R lower  leg Pain description: sore, bruised Aggravating factors: laying on R side, dog jumping Relieving factors: ice   PRECAUTIONS: Fall  WEIGHT BEARING RESTRICTIONS: No  FALLS:  Has patient fallen in last 6 months? Yes. Number of falls 2, injury on 10/9, last Thursday at vets missed vets   LIVING ENVIRONMENT: Lives with: lives alone Lives in: House/apartment Stairs: No Has following equipment at home: None  OCCUPATION: retired   PLOF: Independent  PATIENT GOALS: improve pain, improve balance, build muscle   OBJECTIVE:   DIAGNOSTIC FINDINGS: 11/27/21 MR Hip Right  IMPRESSION: 1. Mild age related degenerative changes but no hip fracture or AVN. 2. Mild bilateral peritendinosis without trochanteric bursitis. 3. No significant intrapelvic abnormalities. 11/22/2021 DG Right knee  IMPRESSION: Total right knee replacement. Hardware intact. Anatomic alignment. Small amount of methylmethacrylate may be present in the popliteal space. No acute abnormality identified. 11/22/2021 DG Right Hip/Pelvis  IMPRESSION: 1. Diffuse osteopenia. Degenerative changes lumbar spine and both hips. Cortical irregularity noted of the right femoral neck on one view only. This may be related to old injury. To completely exclude a subtle acute right hip fracture MRI of the right hip suggested.   PATIENT SURVEYS:  LEFS 63/80 = 79% ability   COGNITION: Overall cognitive status: Within functional limits for tasks assessed     SENSATION: Numbness R lateral knee s/p R TKA  EDEMA:  Slightly in R knee  MUSCLE LENGTH: Hamstrings: Right 90 deg; Left 90 deg   POSTURE: forward head  PALPATION: Tenderness/bruising R lateral calf, R hip, very tender to touch  LOWER EXTREMITY ROM:    Active ROM Right eval Left eval  Hip flexion    Hip extension    Hip abduction    Hip adduction    Hip internal rotation 15 0  Hip external rotation 45, Limited by pain 45  Knee flexion 100 110  Knee extension -10  0  Ankle dorsiflexion    Ankle plantarflexion     (Blank rows = not tested)  LOWER EXTREMITY MMT:  MMT Right eval Left eval  Hip flexion 4 4  Hip extension    Hip abduction 4+ 4+  Hip adduction 5 5  Knee flexion    Knee extension 4+ 5  Ankle dorsiflexion 5 5  Ankle plantarflexion     (Blank rows = not tested)  LOWER EXTREMITY SPECIAL TESTS:  NT  FUNCTIONAL TESTS:  5 times sit to stand: 24 seconds, increased R knee pain  GAIT: Distance walked: 27' Assistive device utilized: None Level of assistance: Complete Independence Comments: decreased gait speed, slightly antalgic   TODAY'S TREATMENT  DATE: 01/14/2022 Therapeutic Exercise: to improve strength and mobility.  Demo, verbal and tactile cues throughout for technique. Nustep L6 x 10 min  Gait x 1200' Stairs x 1 flight - reciprocal step Wall squats 5 x 10 sec hold - added ball squeeze to last 2 reps Toe raises at wall x 10 HEP update; info on [employer] aquatic program.  Neuromuscular Reeducation: to improve balance and stability. SBA for safety throughout.  Star excursion pattern 180 deg each side x 5 Perturbations all directions - noted more LOB posterior Wall bumps on wall 2 x 10 for posterior weight shift and return.   12/30/2021 Therapeutic Exercise: to improve strength and mobility.  Demo, verbal and tactile cues throughout for technique. Bike L2 x 6 min  Ankle circles Ankle alphabet BAPS board level 2- DF/PF x 20, Inv/EVER x 20, CCW and CW circles x 10 Seated piriformis stretches bil Star excursion pattern taps 180 deg x 5 bil  Manual Therapy: to decrease muscle spasm and pain and improve mobility STM/TPR to L piriformis/L glut med, skilled palpation and monitoring during dry needling. Trigger Point Dry-Needling  Treatment instructions: Expect mild to moderate muscle soreness. S/S of pneumothorax if dry needled over a lung field, and to seek  immediate medical attention should they occur. Patient verbalized understanding of these instructions and education. Patient Consent Given: Yes Education handout provided: Previously provided Muscles treated: L piriformis, L glut med Electrical stimulation performed: No Parameters: N/A Treatment response/outcome: Twitch Response Elicited and Palpable Increase in Muscle Length   12/27/2021 Therapeutic Exercise: to improve strength and mobility.  Demo, verbal and tactile cues throughout for technique. Bike L2 x  7 min Step ups (1 riser) 2 x 10 bil  Side step ups (1 riser) 2 x 10 bil  Step down - challenging Stepping up and over riser - leading with toe not heel Heel taps on dots - forward 2 x 10 bil Side taps 2 x 10 bil - cues to keep weight over stance leg Backwards taps 2 x 10 bil  Standing hip hikes on step x 10 bil   PATIENT EDUCATION:  Education details: HEP update 11/6, 11/13, 11/16 for ankle exercises, 01/14/22- balance exercises.   Person educated: Patient Education method: Explanation and Demonstration Education comprehension: verbalized understanding  HOME EXERCISE PROGRAM: Access Code: [email or web address] URL: [email or web address] Date: 01/14/2022 Prepared by: Glenetta Hew  Exercises - Supine Lower Trunk Rotation  - 1 x daily - 7 x weekly - 2 sets - 10 reps - Supine Posterior Pelvic Tilt  - 1 x daily - 7 x weekly - 2 sets - 10 reps - Sit to Stand  - 1 x daily - 7 x weekly - 2 sets - 10 reps - Bridge with Hip Abduction and Resistance  - 1 x daily - 7 x weekly - 3 sets - 10 reps - Clamshell with Resistance  - 1 x daily - 7 x weekly - 3 sets - 10 reps - Seated Hip Flexor Stretch (Mirrored)  - 2 x daily - 7 x weekly - 1 sets - 3 reps - 30-60 sec hold - Supine Hip Internal and External Rotation  - 2 x daily - 7 x weekly - 2 sets - 10 reps - 3 sec hold - Braided Sidestepping  - 1 x daily - 7 x weekly - 3 sets - 10 reps - Push-Up on Counter  - 1 x daily - 7 x weekly  - 3 sets - 10 reps - Seated Ankle  Pumps  - 3 x daily - 7 x weekly - 1 sets - 10 reps - Seated Long Arc Quad  - 3 x daily - 7 x weekly - 1 sets - 10 reps - Supine Bridge  - 1 x daily - 7 x weekly - 3 sets - 10 reps - Hooklying Clamshell with Resistance  - 1 x daily - 7 x weekly - 3 sets - 10 reps - Clamshell  - 1 x daily - 7 x weekly - 3 sets - 10 reps - Single Leg Balance with Clock Reach  - 1 x daily - 7 x weekly - 3 sets - 10 reps - Seated Ankle Alphabet  - 1 x daily - 7 x weekly - 3 sets - 10 reps - Seated Ankle Eversion with Resistance  - 1 x daily - 7 x weekly - 3 sets - 10 reps - Wall Squat Hold with Ball  - 1 x daily - 7 x weekly - 3 sets - 10 reps - Toe Raise With Back Against Wall  - 1 x daily - 7 x weekly - 3 sets - 10 reps  ASSESSMENT:  CLINICAL IMPRESSION: Kelsey Church is making good progress and reports resolution of RLE pain, meeting LTG #2.  She is also able to ambulate 1200' without fatigue or pain meeting LTG #4, and ascend/descend 1 flight of steps with reciprocal step and good safety.  Her R knee ROM has improved to 5-110 deg, but she reports she did not have full ROM following her R TKA, so has partially met LTG #7.  She reports primary concern left is balance, noticed difficulty with balance in crowded areas.  With perturbations she had difficulty resisting anterior pushes, so updated HEP with exercises focusing on tib anterior strengthening to improve ankle strategy with posterior weight shift.  She also reports difficulty squatting, wall squats for quad strengthening given.   Plan to discharge to community based exercise program next session.  Given information on [employer] programs today.        OBJECTIVE IMPAIRMENTS: Abnormal gait, decreased balance, decreased endurance, difficulty walking, decreased ROM, decreased strength, decreased safety awareness, increased edema, increased fascial restrictions, increased muscle spasms, and pain.   ACTIVITY LIMITATIONS: bending, sitting,  squatting, and sleeping  PARTICIPATION LIMITATIONS: meal prep  PERSONAL FACTORS: Fitness and 3+ comorbidities: chronic L hip pain, history cancer, bil TKR, osteopenia, nephrectomy  are also affecting patient's functional outcome.   REHAB POTENTIAL: Good  CLINICAL DECISION MAKING: Stable/uncomplicated  EVALUATION COMPLEXITY: Low   GOALS: Goals reviewed with patient? Yes  SHORT TERM GOALS: Target date: 12/20/2021   Patient will be independent with initial HEP. Baseline: given Goal status: MET 12/20/21 - met for current, updated.     LONG TERM GOALS: Target date: 01/17/2022   Patient will be independent with advanced/ongoing HEP to improve outcomes and carryover.  Baseline: needs advancement Goal status: MET 01/14/22 - for current  2.  Patient will report at least 75% improvement in right hip and knee pain to improve QOL. Baseline: 8/10 R hip, 6/10 R knee Goal status: MET  01/14/22- pain has resolved.   3.  Patient will demonstrate improved functional LE strength as demonstrated by 5x STS < 14 seconds to decrease fall risk. Baseline: 23 seconds with increased R knee pain Goal status: IN PROGRESS  4.  Patient will complete 1200' feet in 6 minutes with normal gait pattern without increased pain to access community.  Baseline: 995' on 12/15/21 Goal status: MET  01/14/22- 1200' without increased pain.   5.  Patient will report 9 points improvement on LEFS to demonstrate improved functional ability. Baseline: 63/80 Goal status: IN PROGRESS  6.  Patient will demonstrate at least 19/24 on DGI to decrease risk of falls. Baseline: NT Goal status: IN PROGRESS   7.  Patient will demonstrate full R knee ROM 0-110 without increased pain. Baseline: 10-100 Goal status: Partially met. 01/14/2022 5-110.  Patient reports did not get full ROM in R knee following TKA.  No pain.    PLAN:  PT FREQUENCY: 1-2x/week  PT DURATION: 6 weeks  PLANNED INTERVENTIONS: Therapeutic exercises,  Therapeutic activity, Neuromuscular re-education, Balance training, Gait training, Patient/Family education, Self Care, Joint mobilization, Stair training, Prosthetic training, Dry Needling, Spinal mobilization, Cryotherapy, Moist heat, Traction, Ionotophoresis 49m/ml Dexamethasone, Manual therapy, and Re-evaluation  PLAN FOR NEXT SESSION: check remaining goals, review HEP, discharge or 30 day hold.   ERennie Natter PT, DPT  01/14/2022, 9:01 AM

## 2022-01-17 ENCOUNTER — Ambulatory Visit: Payer: Medicare Other | Admitting: Physical Therapy

## 2022-01-17 ENCOUNTER — Encounter: Payer: Self-pay | Admitting: Physical Therapy

## 2022-01-17 DIAGNOSIS — M25661 Stiffness of right knee, not elsewhere classified: Secondary | ICD-10-CM

## 2022-01-17 DIAGNOSIS — R2681 Unsteadiness on feet: Secondary | ICD-10-CM | POA: Diagnosis not present

## 2022-01-17 DIAGNOSIS — M25561 Pain in right knee: Secondary | ICD-10-CM | POA: Diagnosis not present

## 2022-01-17 DIAGNOSIS — M6281 Muscle weakness (generalized): Secondary | ICD-10-CM | POA: Diagnosis not present

## 2022-01-17 DIAGNOSIS — M25551 Pain in right hip: Secondary | ICD-10-CM

## 2022-01-17 DIAGNOSIS — M25552 Pain in left hip: Secondary | ICD-10-CM | POA: Diagnosis not present

## 2022-01-17 DIAGNOSIS — M79642 Pain in left hand: Secondary | ICD-10-CM | POA: Diagnosis not present

## 2022-01-17 NOTE — Therapy (Signed)
OUTPATIENT PHYSICAL THERAPY TREATMENT PHYSICAL THERAPY DISCHARGE SUMMARY  Visits from Start of Care: 8  Current functional level related to goals / functional outcomes: All goals met or almost met, RLE pain resolved, improved functional strength and balance.     Remaining deficits: R knee valgus, lacking full R knee extension   Education / Equipment: HEP  Plan: Patient agrees to discharge.  Patient is being discharged due to meeting the stated rehab goals.       Patient Name: Kelsey Church MRN: [id number] DOB:[birth year], 71 y.o., female Today's Date: 01/17/2022   PT End of Session - 01/17/22 1410     Visit Number 8    Number of Visits 12    Date for PT Re-Evaluation 01/17/22    Authorization Type Medicare + BCBS    Progress Note Due on Visit 10    PT Start Time 1405    PT Stop Time 1450    PT Time Calculation (min) 45 min    Activity Tolerance Patient tolerated treatment well    Behavior During Therapy WFL for tasks assessed/performed              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/10/13   Cancer ([place])    melanoma in 2 sights   Complication of anesthesia    "chills every evening at sundown x 2 weeks"- no fever   Depression    Dysphagia    History of kidney cancer    left   History of shingles    NO RESIDUAL PROBLEMS   Hypertension    Pain    LOWER BACK PAIN   Sleep apnea    CPAP   Past Surgical History:  Procedure Laterality Date   APPENDECTOMY  1958   COLONOSCOPY     COLONOSCOPY W/ POLYPECTOMY     7 polyps   ESOPHAGEAL MANOMETRY N/A 03/17/2021   Procedure: ESOPHAGEAL MANOMETRY (EM);  Surgeon: Kelsey Bears, MD;  Location: [facility];  Service: Gastroenterology;  Laterality: N/A;   FOOT SURGERY Right    removal plantar wart   GALLBLADDER SURGERY  02/14/1990   INJECTION KNEE  09/2011   right    KNEE ARTHROSCOPY  1996   LEFT HEART CATH AND CORONARY ANGIOGRAPHY N/A  07/11/2019   Procedure: LEFT HEART CATH AND CORONARY ANGIOGRAPHY;  Surgeon: Kelsey Crome, MD;  Location: [facility];  Service: Cardiovascular;  Laterality: N/A;   MELANOMA EXCISION  1992   right chin, back   NEPHRECTOMY Left 2022   POLYPECTOMY     SALPINGOOPHORECTOMY  2009   Right   TOTAL KNEE ARTHROPLASTY Right 06/10/2013   Procedure: RIGHT TOTAL KNEE ARTHROPLASTY;  Surgeon: Kelsey Alf, MD;  Location: [facility];  Service: Orthopedics;  Laterality: Right;   TOTAL KNEE ARTHROPLASTY Left 12/16/2013   Procedure: LEFT TOTAL KNEE ARTHROPLASTY;  Surgeon: Kelsey Alf, MD;  Location: [facility];  Service: Orthopedics;  Laterality: Left;   Patient Active Problem List   Diagnosis Date Noted   COVID-19 07/27/2021   Hallucinations 05/06/2021   Viral URI 01/20/2021   Dysphagia    Hyperlipidemia LDL goal <70 12/07/2020   Atherosclerotic heart disease of native coronary artery with other forms of angina pectoris (HCC)    Symptomatic PVCs 12/26/2018   Stage 3a chronic kidney disease ([place]) 12/26/2018   Degenerative lumbar disc 11/01/2016   Gastroesophageal reflux disease with esophagitis 12/10/2014  Moderate osteopenia 12/10/2014   Vitamin B12 deficiency 12/10/2014   OA (osteoarthritis) of knee 12/16/2013   Prediabetes 12/03/2013   Other screening mammogram 09/25/2012   DJD (degenerative joint disease) of knee 09/09/2011   OSA (obstructive sleep apnea) 07/29/2011   Essential hypertension, benign 07/29/2011    PCP: Kelsey Held, DO  REFERRING PROVIDER: Glennon Mac, DO  REFERRING DIAG:  A35.573 (ICD-10-CM) - Right hip pain  M25.561,G89.29 (ICD-10-CM) - Chronic pain of right knee    THERAPY DIAG:  Pain in right hip  Pain in left hip  Acute pain of right knee  Stiffness of right knee, not elsewhere classified  Muscle weakness (generalized)  Unsteadiness on feet  Rationale for Evaluation and Treatment Rehabilitation  ONSET DATE: fall on 11/19/2021    SUBJECTIVE:   SUBJECTIVE STATEMENT: Kelsey Church   reports hip is doing well.  No pain.  No falls or LOB.   A little discomfort over thanksgiving but sleeping in different bed and wearing different shoes.    PERTINENT HISTORY: gastritis and GERD,  nephrectomy 2022, history cancer, history bil TKR 2015, history cardiac disease, chronic L hip pain.  PAIN:  Are you having pain? Yes: NPRS scale: 0/10 Pain location: R lower leg Pain description: sore, bruised Aggravating factors: laying on R side, dog jumping Relieving factors: ice   PRECAUTIONS: Fall  WEIGHT BEARING RESTRICTIONS: No  FALLS:  Has patient fallen in last 6 months? Yes. Number of falls 2, injury on 10/9, last Thursday at vets missed vets   LIVING ENVIRONMENT: Lives with: lives alone Lives in: House/apartment Stairs: No Has following equipment at home: None  OCCUPATION: retired   PLOF: Independent  PATIENT GOALS: improve pain, improve balance, build muscle   OBJECTIVE:   DIAGNOSTIC FINDINGS: 11/27/21 MR Hip Right  IMPRESSION: 1. Mild age related degenerative changes but no hip fracture or AVN. 2. Mild bilateral peritendinosis without trochanteric bursitis. 3. No significant intrapelvic abnormalities. 11/22/2021 DG Right knee  IMPRESSION: Total right knee replacement. Hardware intact. Anatomic alignment. Small amount of methylmethacrylate may be present in the popliteal space. No acute abnormality identified. 11/22/2021 DG Right Hip/Pelvis  IMPRESSION: 1. Diffuse osteopenia. Degenerative changes lumbar spine and both hips. Cortical irregularity noted of the right femoral neck on one view only. This may be related to old injury. To completely exclude a subtle acute right hip fracture MRI of the right hip suggested.   PATIENT SURVEYS:  LEFS 63/80 = 79% ability   COGNITION: Overall cognitive status: Within functional limits for tasks assessed     SENSATION: Numbness R lateral knee s/p R  TKA  EDEMA:  Slightly in R knee  MUSCLE LENGTH: Hamstrings: Right 90 deg; Left 90 deg   POSTURE: forward head  PALPATION: Tenderness/bruising R lateral calf, R hip, very tender to touch  LOWER EXTREMITY ROM:    Active ROM Right eval Left eval  Hip flexion    Hip extension    Hip abduction    Hip adduction    Hip internal rotation 15 0  Hip external rotation 45, Limited by pain 45  Knee flexion 100 110  Knee extension -10 0  Ankle dorsiflexion    Ankle plantarflexion     (Blank rows = not tested)  LOWER EXTREMITY MMT:  MMT Right eval Left eval  Hip flexion 4 4  Hip extension    Hip abduction 4+ 4+  Hip adduction 5 5  Knee flexion    Knee extension 4+ 5  Ankle dorsiflexion  5 5  Ankle plantarflexion     (Blank rows = not tested)  LOWER EXTREMITY SPECIAL TESTS:  NT  FUNCTIONAL TESTS:  5 times sit to stand: 24 seconds, increased R knee pain  GAIT: Distance walked: 20' Assistive device utilized: None Level of assistance: Complete Independence Comments: decreased gait speed, slightly antalgic   TODAY'S TREATMENT                                                                          DATE: 01/17/2022 Therapeutic Exercise: to improve strength and mobility.  Demo, verbal and tactile cues throughout for technique. Bike L2 x 6 min  Wall squats 3 x 15 sec hold, 2 x 10 sec hold, 3 x 10 sec hold with ball squeeze Toe raises against wall 2 x 10 Gait x 1500' STS 2 x 5 without hands  Therapeutic Activity:  to assess progress DGI 23/24 LEFS 70/80 Stepping up and down simulated curb Review of [facility] at [facility] and recommendations  01/14/2022 Therapeutic Exercise: to improve strength and mobility.  Demo, verbal and tactile cues throughout for technique. Nustep L6 x 10 min  Gait x 1200' Stairs x 1 flight - reciprocal step Wall squats 5 x 10 sec hold - added ball squeeze to last 2 reps Toe raises at wall x 10 HEP update; info on [employer] aquatic program.   Neuromuscular Reeducation: to improve balance and stability. SBA for safety throughout.  Star excursion pattern 180 deg each side x 5 Perturbations all directions - noted more LOB posterior Wall bumps on wall 2 x 10 for posterior weight shift and return.   12/30/2021 Therapeutic Exercise: to improve strength and mobility.  Demo, verbal and tactile cues throughout for technique. Bike L2 x 6 min  Ankle circles Ankle alphabet BAPS board level 2- DF/PF x 20, Inv/EVER x 20, CCW and CW circles x 10 Seated piriformis stretches bil Star excursion pattern taps 180 deg x 5 bil  Manual Therapy: to decrease muscle spasm and pain and improve mobility STM/TPR to L piriformis/L glut med, skilled palpation and monitoring during dry needling. Trigger Point Dry-Needling  Treatment instructions: Expect mild to moderate muscle soreness. S/S of pneumothorax if dry needled over a lung field, and to seek immediate medical attention should they occur. Patient verbalized understanding of these instructions and education. Patient Consent Given: Yes Education handout provided: Previously provided Muscles treated: L piriformis, L glut med Electrical stimulation performed: No Parameters: N/A Treatment response/outcome: Twitch Response Elicited and Palpable Increase in Muscle Length   12/27/2021 Therapeutic Exercise: to improve strength and mobility.  Demo, verbal and tactile cues throughout for technique. Bike L2 x  7 min Step ups (1 riser) 2 x 10 bil  Side step ups (1 riser) 2 x 10 bil  Step down - challenging Stepping up and over riser - leading with toe not heel Heel taps on dots - forward 2 x 10 bil Side taps 2 x 10 bil - cues to keep weight over stance leg Backwards taps 2 x 10 bil  Standing hip hikes on step x 10 bil   PATIENT EDUCATION:  Education details: HEP update 11/6, 11/13, 11/16 for ankle exercises, 01/14/22- balance exercises.   Person educated: Patient Education  method: Explanation and  Demonstration Education comprehension: verbalized understanding  HOME EXERCISE PROGRAM: Access Code: [email or web address] URL: [email or web address] Date: 01/14/2022 Prepared by: Glenetta Hew  Exercises - Supine Lower Trunk Rotation  - 1 x daily - 7 x weekly - 2 sets - 10 reps - Supine Posterior Pelvic Tilt  - 1 x daily - 7 x weekly - 2 sets - 10 reps - Sit to Stand  - 1 x daily - 7 x weekly - 2 sets - 10 reps - Bridge with Hip Abduction and Resistance  - 1 x daily - 7 x weekly - 3 sets - 10 reps - Clamshell with Resistance  - 1 x daily - 7 x weekly - 3 sets - 10 reps - Seated Hip Flexor Stretch (Mirrored)  - 2 x daily - 7 x weekly - 1 sets - 3 reps - 30-60 sec hold - Supine Hip Internal and External Rotation  - 2 x daily - 7 x weekly - 2 sets - 10 reps - 3 sec hold - Braided Sidestepping  - 1 x daily - 7 x weekly - 3 sets - 10 reps - Push-Up on Counter  - 1 x daily - 7 x weekly - 3 sets - 10 reps - Seated Ankle Pumps  - 3 x daily - 7 x weekly - 1 sets - 10 reps - Seated Long Arc Quad  - 3 x daily - 7 x weekly - 1 sets - 10 reps - Supine Bridge  - 1 x daily - 7 x weekly - 3 sets - 10 reps - Hooklying Clamshell with Resistance  - 1 x daily - 7 x weekly - 3 sets - 10 reps - Clamshell  - 1 x daily - 7 x weekly - 3 sets - 10 reps - Single Leg Balance with Clock Reach  - 1 x daily - 7 x weekly - 3 sets - 10 reps - Seated Ankle Alphabet  - 1 x daily - 7 x weekly - 3 sets - 10 reps - Seated Ankle Eversion with Resistance  - 1 x daily - 7 x weekly - 3 sets - 10 reps - Wall Squat Hold with Ball  - 1 x daily - 7 x weekly - 3 sets - 10 reps - Toe Raise With Back Against Wall  - 1 x daily - 7 x weekly - 3 sets - 10 reps  ASSESSMENT:  CLINICAL IMPRESSION: [name] has made good progress with PT and reports resolution of RLE pain and demonstrates improved functional strength and balance.  She has met LTG #1-#5, almost met LTG #6 and #7.  She scored 23/24 demonstrating low risk of falls, and was  able to step up and down simulated curb safely.  She is ready and agreeable to discharge to HEP, and is planning on joining [employer] to continue progress and strengthening.        OBJECTIVE IMPAIRMENTS: Abnormal gait, decreased balance, decreased endurance, difficulty walking, decreased ROM, decreased strength, decreased safety awareness, increased edema, increased fascial restrictions, increased muscle spasms, and pain.   ACTIVITY LIMITATIONS: bending, sitting, squatting, and sleeping  PARTICIPATION LIMITATIONS: meal prep  PERSONAL FACTORS: Fitness and 3+ comorbidities: chronic L hip pain, history cancer, bil TKR, osteopenia, nephrectomy  are also affecting patient's functional outcome.   REHAB POTENTIAL: Good  CLINICAL DECISION MAKING: Stable/uncomplicated  EVALUATION COMPLEXITY: Low   GOALS: Goals reviewed with patient? Yes  SHORT TERM GOALS: Target date: 12/20/2021   Patient will be independent with  initial HEP. Baseline: given Goal status: MET 12/20/21 - met for current, updated.     LONG TERM GOALS: Target date: 01/17/2022   Patient will be independent with advanced/ongoing HEP to improve outcomes and carryover.  Baseline: needs advancement Goal status: MET 01/14/22 - for current  2.  Patient will report at least 75% improvement in right hip and knee pain to improve QOL. Baseline: 8/10 R hip, 6/10 R knee Goal status: MET  01/14/22- pain has resolved.   3.  Patient will demonstrate improved functional LE strength as demonstrated by 5x STS < 14 seconds to decrease fall risk. Baseline: 23 seconds with increased R knee pain Goal status: MET 01/17/2022- 13.7 seconds without UE assist, no knee pain.   4.  Patient will complete 1200' feet in 6 minutes with normal gait pattern without increased pain to access community.  Baseline: 995' on 12/15/21 Goal status: MET  01/14/22- 1200' without increased pain. 01/17/2022 -1325' in 6 minutes  5.  Patient will report 9 points improvement on  LEFS to demonstrate improved functional ability. Baseline: 63/80 Goal status: IN PROGRESS  01/17/2022- 70/80  6.  Patient will demonstrate at least 19/24 on DGI to decrease risk of falls. Baseline: NT Goal status: MET 01/17/2022- 23/24    7.  Patient will demonstrate full R knee ROM 0-110 without increased pain. Baseline: 10-100 Goal status: Partially met. 01/14/2022 5-110.  Patient reports did not get full ROM in R knee following TKA.  No pain.    PLAN:  PT FREQUENCY: 1-2x/week  PT DURATION: 6 weeks  PLANNED INTERVENTIONS: Therapeutic exercises, Therapeutic activity, Neuromuscular re-education, Balance training, Gait training, Patient/Family education, Self Care, Joint mobilization, Stair training, Prosthetic training, Dry Needling, Spinal mobilization, Cryotherapy, Moist heat, Traction, Ionotophoresis 37m/ml Dexamethasone, Manual therapy, and Re-evaluation  PLAN FOR NEXT SESSION: discharged to HEP.    ERennie Natter PT, DPT  01/17/2022, 3:25 PM

## 2022-02-13 ENCOUNTER — Other Ambulatory Visit: Payer: Self-pay | Admitting: Physician Assistant

## 2022-02-15 DIAGNOSIS — M1812 Unilateral primary osteoarthritis of first carpometacarpal joint, left hand: Secondary | ICD-10-CM | POA: Diagnosis not present

## 2022-02-15 DIAGNOSIS — M189 Osteoarthritis of first carpometacarpal joint, unspecified: Secondary | ICD-10-CM | POA: Diagnosis not present

## 2022-02-15 DIAGNOSIS — M13841 Other specified arthritis, right hand: Secondary | ICD-10-CM | POA: Diagnosis not present

## 2022-02-15 DIAGNOSIS — S62338A Displaced fracture of neck of other metacarpal bone, initial encounter for closed fracture: Secondary | ICD-10-CM | POA: Diagnosis not present

## 2022-03-24 DIAGNOSIS — N184 Chronic kidney disease, stage 4 (severe): Secondary | ICD-10-CM | POA: Diagnosis not present

## 2022-04-01 DIAGNOSIS — I2510 Atherosclerotic heart disease of native coronary artery without angina pectoris: Secondary | ICD-10-CM | POA: Diagnosis not present

## 2022-04-01 DIAGNOSIS — Z905 Acquired absence of kidney: Secondary | ICD-10-CM | POA: Diagnosis not present

## 2022-04-01 DIAGNOSIS — C642 Malignant neoplasm of left kidney, except renal pelvis: Secondary | ICD-10-CM | POA: Diagnosis not present

## 2022-04-01 DIAGNOSIS — N184 Chronic kidney disease, stage 4 (severe): Secondary | ICD-10-CM | POA: Diagnosis not present

## 2022-04-01 DIAGNOSIS — D631 Anemia in chronic kidney disease: Secondary | ICD-10-CM | POA: Diagnosis not present

## 2022-04-01 DIAGNOSIS — R1310 Dysphagia, unspecified: Secondary | ICD-10-CM | POA: Diagnosis not present

## 2022-04-08 ENCOUNTER — Encounter: Payer: Self-pay | Admitting: Cardiology

## 2022-04-08 DIAGNOSIS — E785 Hyperlipidemia, unspecified: Secondary | ICD-10-CM

## 2022-04-08 DIAGNOSIS — I2510 Atherosclerotic heart disease of native coronary artery without angina pectoris: Secondary | ICD-10-CM

## 2022-04-08 DIAGNOSIS — E7800 Pure hypercholesterolemia, unspecified: Secondary | ICD-10-CM

## 2022-04-25 ENCOUNTER — Ambulatory Visit: Payer: Medicare Other | Admitting: Cardiology

## 2022-04-26 ENCOUNTER — Ambulatory Visit (INDEPENDENT_AMBULATORY_CARE_PROVIDER_SITE_OTHER): Payer: Medicare Other | Admitting: Pulmonary Disease

## 2022-04-26 ENCOUNTER — Encounter (HOSPITAL_BASED_OUTPATIENT_CLINIC_OR_DEPARTMENT_OTHER): Payer: Self-pay | Admitting: Pulmonary Disease

## 2022-04-26 VITALS — BP 130/72 | HR 67 | Temp 97.8°F | Ht 66.0 in | Wt 182.4 lb

## 2022-04-26 DIAGNOSIS — G4733 Obstructive sleep apnea (adult) (pediatric): Secondary | ICD-10-CM

## 2022-04-26 NOTE — Progress Notes (Signed)
   Subjective:    Patient ID: Kelsey Church, female    DOB: 1950/11/24, 72 y.o.   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 Moderate OSA was diagnosed in 2013 ,on CPAP with nasal pillows since then, DME is [facility]    PMH - GERD  Chief Complaint  Patient presents with   Follow-up    Pt states she is still having some issues with the cpap and mask. Pt wants to discuss Inspire.   She continues to struggle with her CPAP.  She changed from nasal to fullface mask due to mouth breathing she is struggling with getting a good seal on a fullface mask especially because she has dentures and this produces a large leak. She does feel tired during the day.  Her CPAP hours have decreased. She would like to discuss alternative of implant  She is maintaining on long-acting metoprolol and I wonder if this is causing her side effects of hallucinations and restless sleep .  She has an upcoming cardiology appointment   Significant tests/ events reviewed   05/2021 CPAP titration study-164 pounds-CPAP 9 cm   2013 PSG  moderate OSa with AHi 22/h & lowest desatn to 87% corrected by CPAP 9 cm with swift nasal pillows  Review of Systems neg for any significant sore throat, dysphagia, itching, sneezing, nasal congestion or excess/ purulent secretions, fever, chills, sweats, unintended wt loss, pleuritic or exertional cp, hempoptysis, orthopnea pnd or change in chronic leg swelling. Also denies presyncope, palpitations, heartburn, abdominal pain, nausea, vomiting, diarrhea or change in bowel or urinary habits, dysuria,hematuria, rash, arthralgias, visual complaints, headache, numbness weakness or ataxia.     Objective:   Physical Exam  Gen. Pleasant, obese, in no distress ENT - no lesions, no post nasal drip Neck: No JVD, no thyromegaly, no carotid bruits Lungs: no use of accessory muscles, no dullness to percussion, decreased without rales or rhonchi  Cardiovascular: Rhythm regular, heart  sounds  normal, no murmurs or gallops, no peripheral edema Musculoskeletal: No deformities, no cyanosis or clubbing , no tremors       Assessment & Plan:

## 2022-04-26 NOTE — Patient Instructions (Signed)
X change to autoCPAP 5-10 cm  X Home sleep test. Based on this,we will refer to ENT for inspire

## 2022-04-26 NOTE — Assessment & Plan Note (Signed)
CPAP download shows good control of events and 9 cm with minimal leak, usage is decreased to average 5.5 hours with a few missed nights. CPAP is certainly helped but she has had mask issues over the last 2 years surprisingly ever since she changed her machine out.  We will change settings back to auto 5 to 10 cm and hopefully this will lower transmitted pressure will improve her compliance We will also repeat home sleep test and based on results refer her for hypoglossal nerve stimulator implant

## 2022-04-27 ENCOUNTER — Ambulatory Visit: Payer: Medicare Other | Admitting: Physician Assistant

## 2022-04-27 NOTE — Progress Notes (Unsigned)
Cardiology Office Note   Date:  04/28/2022   ID:  Kelsey Church, DOB [birth year], MRN [id number]  PCP:  Carollee Herter, Alferd Apa, DO  Cardiologist:   Minus Breeding, MD Referring:  Carollee Herter, Alferd Apa, DO  Chief Complaint  Patient presents with   Coronary Artery Disease      History of Present Illness: Kelsey Church is a 72 y.o. female who was referred by Kelsey Held, DO for evaluation of CAD.   She had an abnormal POET (Plain Old Exercise Treadmill) and had cath with results below.  She is managed medically for CAD.   She had predominantly small vessel or distal vessel disease.  She had nonobstructive disease in proximal large vessel and mid large vessels.   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  She subsequently developed renal insufficiency with a creatinine peaking at 4.7.  It was thought that this could have been her Crestor which reduced to 5 mg although she and her daughter-in-law think it was related to [facility] that was being used.  Her creatinine did subsequently come down to 3.3.  She has not had any further cardiac work-up but there were no apparent cardiac complications during her nephrectomy.  She presents for follow up.  She has been getting chest discomfort when she walks her dog.  She says she really could not even make it 100 yards now without getting some mild mid discomfort.  She had a little short of breath.  She is not having any of this at rest.  [place] has [place]  This been going on for several months and may be slightly getting worse.  She is not having any nausea vomiting or diaphoresis.  She is not having any palpitations, presyncope or syncope.   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/10/13   Cancer ([place])    melanoma in 2 sights   Complication of anesthesia    "chills  every evening at sundown x 2 weeks"- no fever   Depression    Dysphagia    History of kidney cancer    left   History of shingles    NO RESIDUAL PROBLEMS   Hypertension    Pain    LOWER BACK PAIN   Sleep apnea    CPAP    Past Surgical History:  Procedure Laterality Date   APPENDECTOMY  1958   COLONOSCOPY     COLONOSCOPY W/ POLYPECTOMY     7 polyps   ESOPHAGEAL MANOMETRY N/A 03/17/2021   Procedure: ESOPHAGEAL MANOMETRY (EM);  Surgeon: Jerene Bears, MD;  Location: [facility];  Service: Gastroenterology;  Laterality: N/A;   FOOT SURGERY Right    removal plantar wart   GALLBLADDER SURGERY  02/14/1990   INJECTION KNEE  09/2011   right    KNEE ARTHROSCOPY  1996   LEFT HEART CATH AND CORONARY ANGIOGRAPHY N/A 07/11/2019   Procedure: LEFT HEART CATH AND CORONARY ANGIOGRAPHY;  Surgeon: Belva Crome, MD;  Location: [facility];  Service: Cardiovascular;  Laterality: N/A;   MELANOMA EXCISION  1992   right chin, back   NEPHRECTOMY Left 2022   POLYPECTOMY     SALPINGOOPHORECTOMY  2009   Right   TOTAL KNEE ARTHROPLASTY Right 06/10/2013   Procedure: RIGHT TOTAL KNEE  ARTHROPLASTY;  Surgeon: [name], MD;  Location: [facility];  Service: Orthopedics;  Laterality: Right;   TOTAL KNEE ARTHROPLASTY Left 12/16/2013   Procedure: LEFT TOTAL KNEE ARTHROPLASTY;  Surgeon: [name], MD;  Location: [facility];  Service: Orthopedics;  Laterality: Left;     Current Outpatient Medications  Medication Sig Dispense Refill   acetaminophen (TYLENOL) 650 MG CR tablet Take 1,300 mg by mouth every 8 (eight) hours as needed for pain.      aspirin EC 81 MG tablet Take 1 tablet (81 mg total) by mouth daily. 90 tablet 3   citalopram (CELEXA) 20 MG tablet Take 1 tablet (20 mg total) by mouth at bedtime. 90 tablet 1   Evolocumab (REPATHA SURECLICK) [id number] MG/ML SOAJ Inject 1 mL into the skin every 14 (fourteen) days. 6 mL 3   isosorbide mononitrate (IMDUR) 60 MG 24 hr tablet Take 1 tablet (60 mg  total) by mouth daily. 90 tablet 3   metoprolol succinate (TOPROL-XL) 25 MG 24 hr tablet Take 2 tablets (50 mg total) by mouth in the morning AND 1 tablet (25 mg total) every evening. 270 tablet 3   pantoprazole (PROTONIX) 40 MG tablet TAKE 1 TABLET (40 MG TOTAL) BY MOUTH TWICE A DAY BEFORE MEALS 180 tablet 1   ranolazine (RANEXA) 500 MG 12 hr tablet Take 1 tablet (500 mg total) by mouth 2 (two) times daily. 180 tablet 3   nitroGLYCERIN (NITROSTAT) 0.4 MG SL tablet Place 1 tablet (0.4 mg total) under the tongue every 5 (five) minutes as needed for chest pain. 25 tablet 3   No current facility-administered medications for this visit.    Allergies:   Atorvastatin and Rosuvastatin    ROS:  Please see the history of present illness.   Otherwise, review of systems are positive for none.   All other systems are reviewed and negative.    PHYSICAL EXAM: VS:  BP 126/62   Pulse 64   Ht '5\' 6"'$  (1.676 m)   Wt 183 lb 6.4 oz (83.2 kg)   SpO2 96%   BMI 29.60 kg/m  , BMI Body mass index is 29.6 kg/m. GENERAL:  Well appearing NECK:  No jugular venous distention, waveform within normal limits, carotid upstroke brisk and symmetric, no bruits, no thyromegaly LUNGS:  Clear to auscultation bilaterally CHEST:  Unremarkable HEART:  PMI not displaced or sustained,S1 and S2 within normal limits, no S3, no S4, no clicks, no rubs, no murmurs ABD:  Flat, positive bowel sounds normal in frequency in pitch, no bruits, no rebound, no guarding, no midline pulsatile mass, no hepatomegaly, no splenomegaly EXT:  2 plus pulses throughout, no edema, no cyanosis no clubbing  EKG:  EKG is  ordered today. Sinus rhythm, rate 64, axis within normal limits, intervals within normal limits, premature atrial contractions, no acute ST-T wave changes.   Recent Labs: No results found for requested labs within last 365 days.    Lipid Panel    Component Value Date/Time   CHOL 138 07/20/2021 0827   TRIG 98 07/20/2021 0827    HDL 62 07/20/2021 0827   CHOLHDL 2.2 07/20/2021 0827   CHOLHDL 4 03/02/2021 1628   VLDL 29.4 03/02/2021 1628   LDLCALC 58 07/20/2021 0827    Diagnostic Dominance: Right    Wt Readings from Last 3 Encounters:  04/28/22 183 lb 6.4 oz (83.2 kg)  04/26/22 182 lb 6.4 oz (82.7 kg)  12/02/21 173 lb 12.8 oz (78.8 kg)  Other studies Reviewed: Additional studies/ records that were reviewed today include: Labs Review of the above records demonstrates:  Please see elsewhere in the note.     ASSESSMENT AND PLAN:   CKD IIIa:   She is having this followed closely by nephrology and her last creatinine was 1.99 with labs drawn today that I do not have back yet.  If I end up doing a catheterization she would need to be hospitalized overnight for hydration. \  DYSLIPIDEMIA:     LDL was 58.  HDL 62.  Continue the meds as listed.  CAD: She is having exertional angina which is somewhat new in onset or slightly progressive.  She has known coronary disease as described.  She and I had a long conversation about this.  We would like to try to avoid catheterization with her renal insufficiency insomnia start with Imdur 60 mg.  If she still having discomfort in 2 weeks ago to 120 mg and if she still having the exertional discomfort at that point we would need cardiac cath.  She is going on a mission trip to [place] in early May and we will be pushed to get this either managed medically or further testing done prior to that.  HALLUCINATIONS: These are described to previous notes and she said this has now improved.  Ultimately it was not thought to be related to Ranexa as she is continue this.  Current medicines are reviewed at length with the patient today.  The patient does not have concerns regarding medicines.  The following changes have been made: None  Labs/ tests ordered today include:  None  Orders Placed This Encounter  Procedures   EKG 12-Lead    Disposition:   FU with me in one month    Signed, Minus Breeding, MD  04/28/2022 10:47 AM    [place]

## 2022-04-28 ENCOUNTER — Encounter: Payer: Self-pay | Admitting: Cardiology

## 2022-04-28 ENCOUNTER — Ambulatory Visit: Payer: Medicare Other | Attending: Cardiology | Admitting: Cardiology

## 2022-04-28 VITALS — BP 126/62 | HR 64 | Ht 66.0 in | Wt 183.4 lb

## 2022-04-28 DIAGNOSIS — N1831 Chronic kidney disease, stage 3a: Secondary | ICD-10-CM | POA: Insufficient documentation

## 2022-04-28 DIAGNOSIS — I2510 Atherosclerotic heart disease of native coronary artery without angina pectoris: Secondary | ICD-10-CM | POA: Insufficient documentation

## 2022-04-28 DIAGNOSIS — E785 Hyperlipidemia, unspecified: Secondary | ICD-10-CM | POA: Insufficient documentation

## 2022-04-28 DIAGNOSIS — E7800 Pure hypercholesterolemia, unspecified: Secondary | ICD-10-CM | POA: Diagnosis not present

## 2022-04-28 LAB — CBC

## 2022-04-28 MED ORDER — ISOSORBIDE MONONITRATE ER 60 MG PO TB24: 60.0000 mg | ORAL_TABLET | Freq: Every day | ORAL | 3 refills | Status: DC

## 2022-04-28 MED ORDER — NITROGLYCERIN 0.4 MG SL SUBL: 0.4000 mg | SUBLINGUAL_TABLET | SUBLINGUAL | 3 refills | Status: AC | PRN

## 2022-04-28 NOTE — Patient Instructions (Signed)
Medication Instructions:   START Imdur 60 mg daily-Take 1 tablet by mouth daily  *If you need a refill on your cardiac medications before your next appointment, please call your pharmacy*  Lab Work: NONE ordered at this time of appointment   If you have labs (blood work) drawn today and your tests are completely normal, you will receive your results only by: [place] (if you have MyChart) OR A paper copy in the mail If you have any lab test that is abnormal or we need to change your treatment, we will call you to review the results.  Testing/Procedures: NONE ordered at this time of appointment   Follow-Up: At [facility], you and your health needs are our priority.  As part of our continuing mission to provide you with exceptional heart care, we have created designated Provider Care Teams.  These Care Teams include your primary Cardiologist (physician) and Advanced Practice Providers (APPs -  Physician Assistants and Nurse Practitioners) who all work together to provide you with the care you need, when you need it.   Your next appointment:   1 month(s)  Provider:   Minus Breeding, MD     Other Instructions

## 2022-04-29 LAB — BASIC METABOLIC PANEL
BUN/Creatinine Ratio: 17 (ref 12–28)
BUN: 32 mg/dL — ABNORMAL HIGH (ref 8–27)
CO2: 24 mmol/L (ref 20–29)
Calcium: 9.4 mg/dL (ref 8.7–10.3)
Chloride: 106 mmol/L (ref 96–106)
Creatinine, Ser: 1.88 mg/dL — ABNORMAL HIGH (ref 0.57–1.00)
Glucose: 83 mg/dL (ref 70–99)
Potassium: 4.6 mmol/L (ref 3.5–5.2)
Sodium: 142 mmol/L (ref 134–144)
eGFR: 28 mL/min/{1.73_m2} — ABNORMAL LOW (ref >59)

## 2022-04-29 LAB — LIPID PANEL
Chol/HDL Ratio: 2.4 ratio (ref 0.0–4.4)
Cholesterol, Total: 132 mg/dL (ref 100–199)
HDL: 56 mg/dL (ref >39)
LDL Chol Calc (NIH): 53 mg/dL (ref 0–99)
Triglycerides: 134 mg/dL (ref 0–149)
VLDL Cholesterol Cal: 23 mg/dL (ref 5–40)

## 2022-04-29 LAB — CBC
Hematocrit: 39.1 % (ref 34.0–46.6)
Hemoglobin: 12.5 g/dL (ref 11.1–15.9)
MCH: 26.8 pg (ref 26.6–33.0)
MCHC: 32 g/dL (ref 31.5–35.7)
MCV: 84 fL (ref 79–97)
Platelets: 276 10*3/uL (ref 150–450)
RBC: 4.67 x10E6/uL (ref 3.77–5.28)
RDW: 14.4 % (ref 11.7–15.4)
WBC: 5.8 10*3/uL (ref 3.4–10.8)

## 2022-05-03 ENCOUNTER — Other Ambulatory Visit: Payer: Self-pay | Admitting: Cardiology

## 2022-05-03 DIAGNOSIS — F3342 Major depressive disorder, recurrent, in full remission: Secondary | ICD-10-CM

## 2022-05-06 NOTE — Addendum Note (Signed)
Addended by: Agnes Lawrence on: 05/06/2022 10:08 AM   Modules accepted: Orders

## 2022-05-09 ENCOUNTER — Other Ambulatory Visit: Payer: Self-pay | Admitting: Urology

## 2022-05-09 DIAGNOSIS — Z85528 Personal history of other malignant neoplasm of kidney: Secondary | ICD-10-CM

## 2022-05-24 ENCOUNTER — Telehealth: Payer: Self-pay | Admitting: Family Medicine

## 2022-05-24 NOTE — Telephone Encounter (Signed)
Contacted Maythe H Sylla to schedule their annual wellness visit. Appointment made for 06/03/2022.  [name]; Care Guide Ambulatory Clinical Support  l [facility] Direct Dial: [phone]

## 2022-05-26 ENCOUNTER — Ambulatory Visit: Payer: Medicare Other | Admitting: Cardiology

## 2022-05-29 NOTE — Progress Notes (Unsigned)
  Cardiology Office Note:   Date:  05/30/2022  ID:  Kelsey Church, DOB 17-Oct-1950, MRN [id number]  History of Present Illness:   Kelsey Church is a 72 y.o. female for evaluation of CAD.   She had an abnormal POET (Plain Old Exercise Treadmill) and had cath with results below.  She is managed medically for CAD.   She had predominantly small vessel or distal vessel disease.  She had nonobstructive disease in proximal large vessel and mid large vessels.   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  She subsequently developed renal insufficiency with a creatinine peaking at 4.7.  It was thought that this could have been her Crestor which reduced to 5 mg although she and her daughter-in-law think it was related to [facility] that was being used.  Her creatinine did subsequently come down to 3.3.  She has not had any further cardiac work-up but there were no apparent cardiac complications during her nephrectomy.  She presents for follow up.  She had some increased chest pain at the last visit but we chose to manage this medically trying to avoid a cath with her CKD.  I added Imdur 60 mg at the last visit.  She said she had a hard time with this feeling fatigued and having headaches but she ultimately did not tolerate it.  She thinks it probably helped her shortness of breath and chest discomfort somewhat.  She was able to walk her dogs a little bit more.  She still after 100 yards can have some discomfort.  She is not getting any resting chest pressure, neck or arm discomfort.  She does not have any resting shortness of breath, PND or orthopnea.  She has had no new palpitations, presyncope or syncope.  ROS: As stated in the HPI and negative for all other systems.  Studies Reviewed:    EKG:  NA   Risk Assessment/Calculations:              Physical Exam:   VS:  BP 128/76   Pulse 75   Ht 5\' 6"  (1.676 m)   Wt 182 lb 9.6 oz (82.8 kg)   SpO2  96%   BMI 29.47 kg/m    Wt Readings from Last 3 Encounters:  05/30/22 182 lb 9.6 oz (82.8 kg)  04/28/22 183 lb 6.4 oz (83.2 kg)  04/26/22 182 lb 6.4 oz (82.7 kg)     GEN: Well nourished, well developed in no acute distress NECK: No JVD; No carotid bruits CARDIAC: RRR, no murmurs, rubs, gallops RESPIRATORY:  Clear to auscultation without rales, wheezing or rhonchi  ABDOMEN: Soft, non-tender, non-distended EXTREMITIES:  No edema; No deformity   ASSESSMENT AND PLAN:   CKD IIIa:   Creat was 1.88.  No change in therapy.    DYSLIPIDEMIA:     LDL was 53 with HDL of 56.  No change in therapy.    CAD: We did have a long discussion about this.  She really does not want to take more medicines.  She thinks things are doing OK and does not want more aggressive therapy.  She would know to call 911 if she has resting pain.  If she has worsening exertional angina she would also let me know and we can consider med titration.  She wants to try to avoid cardiac cath.      Signed, Rollene Rotunda, MD

## 2022-05-30 ENCOUNTER — Encounter: Payer: Self-pay | Admitting: Cardiology

## 2022-05-30 ENCOUNTER — Ambulatory Visit: Payer: Medicare Other | Attending: Cardiology | Admitting: Cardiology

## 2022-05-30 VITALS — BP 128/76 | HR 75 | Ht 66.0 in | Wt 182.6 lb

## 2022-05-30 DIAGNOSIS — N1831 Chronic kidney disease, stage 3a: Secondary | ICD-10-CM | POA: Diagnosis not present

## 2022-05-30 DIAGNOSIS — E785 Hyperlipidemia, unspecified: Secondary | ICD-10-CM | POA: Diagnosis not present

## 2022-05-30 DIAGNOSIS — I25118 Atherosclerotic heart disease of native coronary artery with other forms of angina pectoris: Secondary | ICD-10-CM | POA: Diagnosis not present

## 2022-05-30 NOTE — Patient Instructions (Signed)
Medication Instructions:  Your physician recommends that you continue on your current medications as directed. Please refer to the Current Medication list given to you today.  *If you need a refill on your cardiac medications before your next appointment, please call your pharmacy*   Follow-Up: At [facility], you and your health needs are our priority.  As part of our continuing mission to provide you with exceptional heart care, we have created designated Provider Care Teams.  These Care Teams include your primary Cardiologist (physician) and Advanced Practice Providers (APPs -  Physician Assistants and Nurse Practitioners) who all work together to provide you with the care you need, when you need it.  We recommend signing up for the patient portal called "MyChart".  Sign up information is provided on this After Visit Summary.  MyChart is used to connect with patients for Virtual Visits (Telemedicine).  Patients are able to view lab/test results, encounter notes, upcoming appointments, etc.  Non-urgent messages can be sent to your provider as well.   To learn more about what you can do with MyChart, go to [email or web address].    Your next appointment:   6 month(s)  Provider:   Micah Flesher, PA-C, Marjie Skiff, PA-C, Juanda Crumble, PA-C, Joni Reining, DNP, ANP, Bernadene Person, NP, or Carlos Levering, NP

## 2022-06-02 ENCOUNTER — Ambulatory Visit: Payer: Medicare Other | Admitting: Family Medicine

## 2022-06-02 ENCOUNTER — Ambulatory Visit (INDEPENDENT_AMBULATORY_CARE_PROVIDER_SITE_OTHER): Payer: Medicare Other | Admitting: Family Medicine

## 2022-06-02 ENCOUNTER — Encounter: Payer: Self-pay | Admitting: Family Medicine

## 2022-06-02 VITALS — BP 114/70 | HR 64 | Ht 66.0 in | Wt 182.8 lb

## 2022-06-02 DIAGNOSIS — N184 Chronic kidney disease, stage 4 (severe): Secondary | ICD-10-CM

## 2022-06-02 DIAGNOSIS — I10 Essential (primary) hypertension: Secondary | ICD-10-CM

## 2022-06-02 DIAGNOSIS — E785 Hyperlipidemia, unspecified: Secondary | ICD-10-CM | POA: Diagnosis not present

## 2022-06-02 DIAGNOSIS — N1831 Chronic kidney disease, stage 3a: Secondary | ICD-10-CM

## 2022-06-02 DIAGNOSIS — F3342 Major depressive disorder, recurrent, in full remission: Secondary | ICD-10-CM

## 2022-06-02 DIAGNOSIS — E705 Disorders of tryptophan metabolism: Secondary | ICD-10-CM | POA: Diagnosis not present

## 2022-06-02 MED ORDER — CITALOPRAM HYDROBROMIDE 20 MG PO TABS: ORAL_TABLET | ORAL | 1 refills | Status: DC

## 2022-06-02 NOTE — Progress Notes (Signed)
Subjective:   By signing my name below, I, [name], attest that this documentation has been prepared under the direction and in the presence of Donato Schultz, DO. 06/02/2022   Patient ID: Kelsey Church, female    DOB: [birth year], 72 y.o.   MRN: [id number]  Chief Complaint  Patient presents with   Follow-up    HPI Patient is in today for a follow up visit.   She is following up with a chiropractor for muscle pain.  She continues following up with her cardiologist regularly. She reports having increasing shortness of breathe. She was put on 60 mg isosorbide to help manage it.  She continues taking 140 repatha injections every 14 days.  Lab Results  Component Value Date   CHOL 132 04/28/2022   HDL 56 04/28/2022   LDLCALC 53 04/28/2022   TRIG 134 04/28/2022   CHOLHDL 2.4 04/28/2022   She is an upcomming urologist next months for an MRI.  She is following up with a sleep specialist. She reports having hallucinations in the past but found they have improved. She denies having any memory loss.   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/10/13   Cancer    melanoma in 2 sights   Complication of anesthesia    "chills every evening at sundown x 2 weeks"- no fever   Depression    Dysphagia    History of kidney cancer    left   History of shingles    NO RESIDUAL PROBLEMS   Hypertension    Pain    LOWER BACK PAIN   Sleep apnea    CPAP    Past Surgical History:  Procedure Laterality Date   APPENDECTOMY  1958   COLONOSCOPY     COLONOSCOPY W/ POLYPECTOMY     7 polyps   ESOPHAGEAL MANOMETRY N/A 03/17/2021   Procedure: ESOPHAGEAL MANOMETRY (EM);  Surgeon: Beverley Fiedler, MD;  Location: [facility];  Service: Gastroenterology;  Laterality: N/A;   FOOT SURGERY Right    removal plantar wart   GALLBLADDER SURGERY  02/14/1990   INJECTION KNEE  09/2011   right    KNEE ARTHROSCOPY  1996   LEFT  HEART CATH AND CORONARY ANGIOGRAPHY N/A 07/11/2019   Procedure: LEFT HEART CATH AND CORONARY ANGIOGRAPHY;  Surgeon: Lyn Records, MD;  Location: [facility];  Service: Cardiovascular;  Laterality: N/A;   MELANOMA EXCISION  1992   right chin, back   NEPHRECTOMY Left 2022   POLYPECTOMY     SALPINGOOPHORECTOMY  2009   Right   TOTAL KNEE ARTHROPLASTY Right 06/10/2013   Procedure: RIGHT TOTAL KNEE ARTHROPLASTY;  Surgeon: Loanne Drilling, MD;  Location: [facility];  Service: Orthopedics;  Laterality: Right;   TOTAL KNEE ARTHROPLASTY Left 12/16/2013   Procedure: LEFT TOTAL KNEE ARTHROPLASTY;  Surgeon: Loanne Drilling, MD;  Location: [facility];  Service: Orthopedics;  Laterality: Left;    Family History  Problem Relation Age of Onset   Osteoporosis Mother    Breast cancer Mother    Heart disease Father    Heart attack Father 97       77   Hypertension Father    Hypertension Sister    Diabetes Sister    Colon polyps Sister    Cancer Maternal Grandmother        Breast and Uterine Cancer   Breast cancer Maternal  Grandmother    Thyroid cancer Maternal Grandmother     Social History   Socioeconomic History   Marital status: Widowed    Spouse name: Not on file   Number of children: 1   Years of education: Not on file   Highest education level: Associate degree: occupational, Scientist, product/process development, or vocational program  Occupational History   Occupation: CMA    Employer: [employer]  Tobacco Use   Smoking status: Never    Passive exposure: Never   Smokeless tobacco: Never  Vaping Use   Vaping Use: Never used  Substance and Sexual Activity   Alcohol use: No    Alcohol/week: 0.0 standard drinks of alcohol   Drug use: No   Sexual activity: Not Currently    Partners: Male    Birth control/protection: Post-menopausal    Comment: 1st intercourse- 18, partners- 1,   Other Topics Concern   Not on file  Social History Narrative   Lives alone.  Widow.  One son.    Caffienated drinks-no    Seat belt use often-yes   Regular Exercise-yes   Smoke alarm in the home-yes   Firearms/guns in the home-no   History of physical abuse-no            Social Determinants of Health   Financial Resource Strain: Low Risk  (05/30/2022)   Overall Financial Resource Strain (CARDIA)    Difficulty of Paying Living Expenses: Not very hard  Food Insecurity: No Food Insecurity (05/30/2022)   Hunger Vital Sign    Worried About Running Out of Food in the Last Year: Never true    Ran Out of Food in the Last Year: Never true  Transportation Needs: No Transportation Needs (05/30/2022)   PRAPARE - Administrator, Civil Service (Medical): No    Lack of Transportation (Non-Medical): No  Physical Activity: Sufficiently Active (05/30/2022)   Exercise Vital Sign    Days of Exercise per Week: 7 days    Minutes of Exercise per Session: 40 min  Stress: No Stress Concern Present (05/30/2022)   [employer] of Occupational Health - Occupational Stress Questionnaire    Feeling of Stress : Only a little  Social Connections: Moderately Integrated (05/30/2022)   Social Connection and Isolation Panel [NHANES]    Frequency of Communication with Friends and Family: More than three times a week    Frequency of Social Gatherings with Friends and Family: Three times a week    Attends Religious Services: More than 4 times per year    Active Member of Clubs or Organizations: Yes    Attends Banker Meetings: More than 4 times per year    Marital Status: Widowed  Intimate Partner Violence: Not At Risk (05/31/2021)   Humiliation, Afraid, Rape, and Kick questionnaire    Fear of Current or Ex-Partner: No    Emotionally Abused: No    Physically Abused: No    Sexually Abused: No    Outpatient Medications Prior to Visit  Medication Sig Dispense Refill   acetaminophen (TYLENOL) 650 MG CR tablet Take 1,300 mg by mouth every 8 (eight) hours as needed for pain.      aspirin EC 81 MG tablet Take 1  tablet (81 mg total) by mouth daily. 90 tablet 3   cholecalciferol (VITAMIN D3) 25 MCG (1000 UNIT) tablet Take 1,000 Units by mouth daily.     Evolocumab (REPATHA SURECLICK) 140 MG/ML SOAJ Inject 1 mL into the skin every 14 (fourteen) days. 6 mL  3   metoprolol succinate (TOPROL-XL) 25 MG 24 hr tablet Take 2 tablets (50 mg total) by mouth in the morning AND 1 tablet (25 mg total) every evening. 270 tablet 3   nitroGLYCERIN (NITROSTAT) 0.4 MG SL tablet Place 1 tablet (0.4 mg total) under the tongue every 5 (five) minutes as needed for chest pain. 25 tablet 3   pantoprazole (PROTONIX) 40 MG tablet TAKE 1 TABLET (40 MG TOTAL) BY MOUTH TWICE A DAY BEFORE MEALS 180 tablet 1   ranolazine (RANEXA) 500 MG 12 hr tablet Take 1 tablet (500 mg total) by mouth 2 (two) times daily. 180 tablet 3   vitamin B-12 (CYANOCOBALAMIN) 500 MCG tablet Take 500 mcg by mouth daily.     citalopram (CELEXA) 20 MG tablet TAKE 1 TABLET AT BEDTIME 90 tablet 3   isosorbide mononitrate (IMDUR) 60 MG 24 hr tablet Take 1 tablet (60 mg total) by mouth daily. (Patient not taking: Reported on 06/02/2022) 90 tablet 3   No facility-administered medications prior to visit.    Allergies  Allergen Reactions   Atorvastatin    Rosuvastatin     40mg --Creatinine elevation    Review of Systems  Constitutional:  Negative for fever and malaise/fatigue.  HENT:  Negative for congestion.   Eyes:  Negative for blurred vision.  Respiratory:  Negative for shortness of breath.   Cardiovascular:  Negative for chest pain, palpitations and leg swelling.  Gastrointestinal:  Negative for abdominal pain, blood in stool and nausea.  Genitourinary:  Negative for dysuria and frequency.  Musculoskeletal:  Negative for falls.  Skin:  Negative for rash.  Neurological:  Negative for dizziness, loss of consciousness and headaches.  Endo/Heme/Allergies:  Negative for environmental allergies.  Psychiatric/Behavioral:  Negative for depression and memory loss.  The patient is not nervous/anxious.        Objective:    Physical Exam Vitals and nursing note reviewed.  Constitutional:      General: She is not in acute distress.    Appearance: Normal appearance. She is well-developed. She is not ill-appearing.  HENT:     Head: Normocephalic and atraumatic.     Right Ear: External ear normal.     Left Ear: External ear normal.  Eyes:     Extraocular Movements: Extraocular movements intact.     Conjunctiva/sclera: Conjunctivae normal.     Pupils: Pupils are equal, round, and reactive to light.  Neck:     Thyroid: No thyromegaly.     Vascular: No carotid bruit or JVD.  Cardiovascular:     Rate and Rhythm: Normal rate and regular rhythm.     Heart sounds: Normal heart sounds. No murmur heard.    No gallop.  Pulmonary:     Effort: Pulmonary effort is normal. No respiratory distress.     Breath sounds: Normal breath sounds. No wheezing or rales.  Chest:     Chest wall: No tenderness.  Musculoskeletal:        General: Normal range of motion.     Cervical back: Normal range of motion and neck supple.  Skin:    General: Skin is warm and dry.  Neurological:     General: No focal deficit present.     Mental Status: She is alert and oriented to person, place, and time.  Psychiatric:        Mood and Affect: Mood normal.        Judgment: Judgment normal.     BP 114/70 (BP Location: Left Arm, Patient  Position: Sitting, Cuff Size: Large)   Pulse 64   Ht 5\' 6"  (1.676 m)   Wt 182 lb 12.8 oz (82.9 kg)   SpO2 98%   BMI 29.50 kg/m  Wt Readings from Last 3 Encounters:  06/02/22 182 lb 12.8 oz (82.9 kg)  05/30/22 182 lb 9.6 oz (82.8 kg)  04/28/22 183 lb 6.4 oz (83.2 kg)       Assessment & Plan:  Hyperlipidemia, unspecified hyperlipidemia type  Recurrent major depressive disorder, in full remission -     Citalopram Hydrobromide; 1 1/2 tab po qd  Dispense: 135 tablet; Refill: 1  Chronic kidney disease, stage 4 (severe)  Pellagra-like  syndrome  Essential hypertension, benign Assessment & Plan: Well controlled, no changes to meds. Encouraged heart healthy diet such as the DASH diet and exercise as tolerated.     Stage 3a chronic kidney disease Assessment & Plan: Per nephrology   Chemistry      Component Value Date/Time   NA 142 04/28/2022 0840   K 4.6 04/28/2022 0840   CL 106 04/28/2022 0840   CO2 24 04/28/2022 0840   BUN 32 (H) 04/28/2022 0840   CREATININE 1.88 (H) 04/28/2022 0840      Component Value Date/Time   CALCIUM 9.4 04/28/2022 0840   ALKPHOS 79 03/02/2021 1628   AST 14 03/02/2021 1628   ALT 13 03/02/2021 1628   BILITOT 0.4 03/02/2021 1628        Hyperlipidemia LDL goal <70 Assessment & Plan: Encourage heart healthy diet such as MIND or DASH diet, increase exercise, avoid trans fats, simple carbohydrates and processed foods, consider a krill or fish or flaxseed oil cap daily.       IDonato Schultz, DO, personally preformed the services described in this documentation.  All medical record entries made by the scribe were at my direction and in my presence.  I have reviewed the chart and discharge instructions (if applicable) and agree that the record reflects my personal performance and is accurate and complete. 06/02/2022   I,[name],acting as a scribe for Donato Schultz, DO.,have documented all relevant documentation on the behalf of Donato Schultz, DO,as directed by  Donato Schultz, DO while in the presence of Donato Schultz, DO.   Donato Schultz, DO

## 2022-06-02 NOTE — Assessment & Plan Note (Signed)
Well controlled, no changes to meds. Encouraged heart healthy diet such as the DASH diet and exercise as tolerated.  °

## 2022-06-02 NOTE — Assessment & Plan Note (Signed)
Per nephrology   Chemistry      Component Value Date/Time   NA 142 04/28/2022 0840   K 4.6 04/28/2022 0840   CL 106 04/28/2022 0840   CO2 24 04/28/2022 0840   BUN 32 (H) 04/28/2022 0840   CREATININE 1.88 (H) 04/28/2022 0840      Component Value Date/Time   CALCIUM 9.4 04/28/2022 0840   ALKPHOS 79 03/02/2021 1628   AST 14 03/02/2021 1628   ALT 13 03/02/2021 1628   BILITOT 0.4 03/02/2021 1628

## 2022-06-02 NOTE — Assessment & Plan Note (Signed)
Encourage heart healthy diet such as MIND or DASH diet, increase exercise, avoid trans fats, simple carbohydrates and processed foods, consider a krill or fish or flaxseed oil cap daily.  °

## 2022-06-03 ENCOUNTER — Telehealth: Payer: Self-pay | Admitting: Family Medicine

## 2022-06-03 ENCOUNTER — Ambulatory Visit (INDEPENDENT_AMBULATORY_CARE_PROVIDER_SITE_OTHER): Payer: Medicare Other

## 2022-06-03 VITALS — Ht 66.0 in | Wt 182.0 lb

## 2022-06-03 DIAGNOSIS — Z0000 Encounter for general adult medical examination without abnormal findings: Secondary | ICD-10-CM

## 2022-06-03 DIAGNOSIS — Z1231 Encounter for screening mammogram for malignant neoplasm of breast: Secondary | ICD-10-CM | POA: Diagnosis not present

## 2022-06-03 NOTE — Telephone Encounter (Signed)
Copied from CRM [id number]. Topic: Medicare AWV >> Jun 03, 2022  8:59 AM Payton Doughty wrote: Reason for CRM: Called patient to reschedule Medicare Annual Wellness Visit (AWV). No voicemail available to leave a message.  Last date of AWV:   Please schedule an appointment at any time with Donne Anon, CMA  .  If any questions, please contact me.  Thank you ,  [name]; Care Guide Ambulatory Clinical Support [place] [facility] Direct Dial: [phone]

## 2022-06-03 NOTE — Patient Instructions (Signed)
Kelsey Church , Thank you for taking time to come for your Medicare Wellness Visit. I appreciate your ongoing commitment to your health goals. Please review the following plan we discussed and let me know if I can assist you in the future.   These are the goals we discussed:  Goals      Patient Stated     Increase exercise & drink more water.   Wants to lose 10 pounds        This is a list of the screening recommended for you and due dates:  Health Maintenance  Topic Date Due   Zoster (Shingles) Vaccine (1 of 2) Never done   COVID-19 Vaccine (5 - 2023-24 season) 10/15/2021   Flu Shot  09/15/2022   DTaP/Tdap/Td vaccine (3 - Td or Tdap) 05/17/2023   Medicare Annual Wellness Visit  06/03/2023   Mammogram  07/15/2023   Colon Cancer Screening  06/03/2025   Pneumonia Vaccine  Completed   DEXA scan (bone density measurement)  Completed   Hepatitis C Screening: USPSTF Recommendation to screen - Ages 36-79 yo.  Completed   HPV Vaccine  Aged Out    Advanced directives: Discussed with patient will pick up form from office  Conditions/risks identified: Keep up the good work  Next appointment: Follow up in one year for your annual wellness visit-06/06/23   Preventive Care 65 Years and Older, Female Preventive care refers to lifestyle choices and visits with your health care provider that can promote health and wellness. What does preventive care include? A yearly physical exam. This is also called an annual well check. Dental exams once or twice a year. Routine eye exams. Ask your health care provider how often you should have your eyes checked. Personal lifestyle choices, including: Daily care of your teeth and gums. Regular physical activity. Eating a healthy diet. Avoiding tobacco and drug use. Limiting alcohol use. Practicing safe sex. Taking low-dose aspirin every day. Taking vitamin and mineral supplements as recommended by your health care provider. What happens during an  annual well check? The services and screenings done by your health care provider during your annual well check will depend on your age, overall health, lifestyle risk factors, and family history of disease. Counseling  Your health care provider may ask you questions about your: Alcohol use. Tobacco use. Drug use. Emotional well-being. Home and relationship well-being. Sexual activity. Eating habits. History of falls. Memory and ability to understand (cognition). Work and work Astronomer. Reproductive health. Screening  You may have the following tests or measurements: Height, weight, and BMI. Blood pressure. Lipid and cholesterol levels. These may be checked every 5 years, or more frequently if you are over 69 years old. Skin check. Lung cancer screening. You may have this screening every year starting at age 56 if you have a 30-pack-year history of smoking and currently smoke or have quit within the past 15 years. Fecal occult blood test (FOBT) of the stool. You may have this test every year starting at age 20. Flexible sigmoidoscopy or colonoscopy. You may have a sigmoidoscopy every 5 years or a colonoscopy every 10 years starting at age 45. Hepatitis C blood test. Hepatitis B blood test. Sexually transmitted disease (STD) testing. Diabetes screening. This is done by checking your blood sugar (glucose) after you have not eaten for a while (fasting). You may have this done every 1-3 years. Bone density scan. This is done to screen for osteoporosis. You may have this done starting at age 31. Mammogram.  This may be done every 1-2 years. Talk to your health care provider about how often you should have regular mammograms. Talk with your health care provider about your test results, treatment options, and if necessary, the need for more tests. Vaccines  Your health care provider may recommend certain vaccines, such as: Influenza vaccine. This is recommended every year. Tetanus,  diphtheria, and acellular pertussis (Tdap, Td) vaccine. You may need a Td booster every 10 years. Zoster vaccine. You may need this after age 69. Pneumococcal 13-valent conjugate (PCV13) vaccine. One dose is recommended after age 12. Pneumococcal polysaccharide (PPSV23) vaccine. One dose is recommended after age 52. Talk to your health care provider about which screenings and vaccines you need and how often you need them. This information is not intended to replace advice given to you by your health care provider. Make sure you discuss any questions you have with your health care provider. Document Released: 02/27/2015 Document Revised: 10/21/2015 Document Reviewed: 12/02/2014 Elsevier Interactive Patient Education  2017 [place] Prevention in the Home Falls can cause injuries. They can happen to people of all ages. There are many things you can do to make your home safe and to help prevent falls. What can I do on the outside of my home? Regularly fix the edges of walkways and driveways and fix any cracks. Remove anything that might make you trip as you walk through a door, such as a raised step or threshold. Trim any bushes or trees on the path to your home. Use bright outdoor lighting. Clear any walking paths of anything that might make someone trip, such as rocks or tools. Regularly check to see if handrails are loose or broken. Make sure that both sides of any steps have handrails. Any raised decks and porches should have guardrails on the edges. Have any leaves, snow, or ice cleared regularly. Use sand or salt on walking paths during winter. Clean up any spills in your garage right away. This includes oil or grease spills. What can I do in the bathroom? Use night lights. Install grab bars by the toilet and in the tub and shower. Do not use towel bars as grab bars. Use non-skid mats or decals in the tub or shower. If you need to sit down in the shower, use a plastic,  non-slip stool. Keep the floor dry. Clean up any water that spills on the floor as soon as it happens. Remove soap buildup in the tub or shower regularly. Attach bath mats securely with double-sided non-slip rug tape. Do not have throw rugs and other things on the floor that can make you trip. What can I do in the bedroom? Use night lights. Make sure that you have a light by your bed that is easy to reach. Do not use any sheets or blankets that are too big for your bed. They should not hang down onto the floor. Have a firm chair that has side arms. You can use this for support while you get dressed. Do not have throw rugs and other things on the floor that can make you trip. What can I do in the kitchen? Clean up any spills right away. Avoid walking on wet floors. Keep items that you use a lot in easy-to-reach places. If you need to reach something above you, use a strong step stool that has a grab bar. Keep electrical cords out of the way. Do not use floor polish or wax that makes floors slippery. If you  must use wax, use non-skid floor wax. Do not have throw rugs and other things on the floor that can make you trip. What can I do with my stairs? Do not leave any items on the stairs. Make sure that there are handrails on both sides of the stairs and use them. Fix handrails that are broken or loose. Make sure that handrails are as long as the stairways. Check any carpeting to make sure that it is firmly attached to the stairs. Fix any carpet that is loose or worn. Avoid having throw rugs at the top or bottom of the stairs. If you do have throw rugs, attach them to the floor with carpet tape. Make sure that you have a light switch at the top of the stairs and the bottom of the stairs. If you do not have them, ask someone to add them for you. What else can I do to help prevent falls? Wear shoes that: Do not have high heels. Have rubber bottoms. Are comfortable and fit you well. Are closed  at the toe. Do not wear sandals. If you use a stepladder: Make sure that it is fully opened. Do not climb a closed stepladder. Make sure that both sides of the stepladder are locked into place. Ask someone to hold it for you, if possible. Clearly mark and make sure that you can see: Any grab bars or handrails. First and last steps. Where the edge of each step is. Use tools that help you move around (mobility aids) if they are needed. These include: Canes. Walkers. Scooters. Crutches. Turn on the lights when you go into a dark area. Replace any light bulbs as soon as they burn out. Set up your furniture so you have a clear path. Avoid moving your furniture around. If any of your floors are uneven, fix them. If there are any pets around you, be aware of where they are. Review your medicines with your doctor. Some medicines can make you feel dizzy. This can increase your chance of falling. Ask your doctor what other things that you can do to help prevent falls. This information is not intended to replace advice given to you by your health care provider. Make sure you discuss any questions you have with your health care provider. Document Released: 11/27/2008 Document Revised: 07/09/2015 Document Reviewed: 03/07/2014 Elsevier Interactive Patient Education  2017 [employer].

## 2022-06-03 NOTE — Progress Notes (Signed)
Subjective:   Kelsey Church is a 72 y.o. female who presents for Medicare Annual (Subsequent) preventive examination.  I connected with  Korrie H Tworek on 06/03/22 by a audio enabled telemedicine application and verified that I am speaking with the correct person using two identifiers.  Patient Location: Home  Provider Location: Home Office  I discussed the limitations of evaluation and management by telemedicine. The patient expressed understanding and agreed to proceed.       Review of Systems     Cardiac Risk Factors include: advanced age (>26men, >90 women);dyslipidemia;hypertension     Objective:    Today's Vitals   06/03/22 1013  Weight: 182 lb (82.6 kg)  Height: 5\' 6"  (1.676 m)   Body mass index is 29.38 kg/m.     06/03/2022   10:19 AM 12/06/2021   10:22 AM 06/09/2021    8:06 PM 05/31/2021    8:35 AM 09/01/2020   10:19 AM 07/11/2019    6:34 AM 09/10/2018    5:07 PM  Advanced Directives  Does Patient Have a Medical Advance Directive? Yes Yes No No No No No  Type of Estate agent of [place];Living will Healthcare Power of [place];Living will       Does patient want to make changes to medical advance directive?  No - Patient declined       Copy of Healthcare Power of Attorney in Chart? No - copy requested No - copy requested       Would patient like information on creating a medical advance directive?   No - Patient declined  Yes (MAU/Ambulatory/Procedural Areas - Information given) No - Patient declined No - Patient declined    Current Medications (verified) Outpatient Encounter Medications as of 06/03/2022  Medication Sig   acetaminophen (TYLENOL) 650 MG CR tablet Take 1,300 mg by mouth every 8 (eight) hours as needed for pain.    aspirin EC 81 MG tablet Take 1 tablet (81 mg total) by mouth daily.   cholecalciferol (VITAMIN D3) 25 MCG (1000 UNIT) tablet Take 1,000 Units by mouth daily.   citalopram (CELEXA) 20 MG tablet 1 1/2 tab po  qd   Evolocumab (REPATHA SURECLICK) 140 MG/ML SOAJ Inject 1 mL into the skin every 14 (fourteen) days.   isosorbide mononitrate (IMDUR) 60 MG 24 hr tablet Take 1 tablet (60 mg total) by mouth daily.   metoprolol succinate (TOPROL-XL) 25 MG 24 hr tablet Take 2 tablets (50 mg total) by mouth in the morning AND 1 tablet (25 mg total) every evening.   nitroGLYCERIN (NITROSTAT) 0.4 MG SL tablet Place 1 tablet (0.4 mg total) under the tongue every 5 (five) minutes as needed for chest pain.   pantoprazole (PROTONIX) 40 MG tablet TAKE 1 TABLET (40 MG TOTAL) BY MOUTH TWICE A DAY BEFORE MEALS   ranolazine (RANEXA) 500 MG 12 hr tablet Take 1 tablet (500 mg total) by mouth 2 (two) times daily.   vitamin B-12 (CYANOCOBALAMIN) 500 MCG tablet Take 500 mcg by mouth daily.   No facility-administered encounter medications on file as of 06/03/2022.    Allergies (verified) Atorvastatin and Rosuvastatin   History: 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/10/13   Cancer    melanoma in 2 sights   Complication of anesthesia    "chills every evening at sundown x 2 weeks"- no fever   Depression    Dysphagia  History of kidney cancer    left   History of shingles    NO RESIDUAL PROBLEMS   Hypertension    Pain    LOWER BACK PAIN   Sleep apnea    CPAP   Past Surgical History:  Procedure Laterality Date   APPENDECTOMY  1958   COLONOSCOPY     COLONOSCOPY W/ POLYPECTOMY     7 polyps   ESOPHAGEAL MANOMETRY N/A 03/17/2021   Procedure: ESOPHAGEAL MANOMETRY (EM);  Surgeon: Beverley Fiedler, MD;  Location: [facility];  Service: Gastroenterology;  Laterality: N/A;   FOOT SURGERY Right    removal plantar wart   GALLBLADDER SURGERY  02/14/1990   INJECTION KNEE  09/2011   right    KNEE ARTHROSCOPY  1996   LEFT HEART CATH AND CORONARY ANGIOGRAPHY N/A 07/11/2019   Procedure: LEFT HEART CATH AND CORONARY ANGIOGRAPHY;  Surgeon: Lyn Records, MD;  Location: [facility];  Service: Cardiovascular;  Laterality: N/A;   MELANOMA EXCISION  1992   right chin, back   NEPHRECTOMY Left 2022   POLYPECTOMY     SALPINGOOPHORECTOMY  2009   Right   TOTAL KNEE ARTHROPLASTY Right 06/10/2013   Procedure: RIGHT TOTAL KNEE ARTHROPLASTY;  Surgeon: Loanne Drilling, MD;  Location: [facility];  Service: Orthopedics;  Laterality: Right;   TOTAL KNEE ARTHROPLASTY Left 12/16/2013   Procedure: LEFT TOTAL KNEE ARTHROPLASTY;  Surgeon: Loanne Drilling, MD;  Location: [facility];  Service: Orthopedics;  Laterality: Left;   Family History  Problem Relation Age of Onset   Osteoporosis Mother    Breast cancer Mother    Heart disease Father    Heart attack Father 73       38   Hypertension Father    Stroke Sister    Hypertension Sister    Diabetes Sister    Colon polyps Sister    Cancer Maternal Grandmother        Breast and Uterine Cancer   Breast cancer Maternal Grandmother    Thyroid cancer Maternal Grandmother    Social History   Socioeconomic History   Marital status: Widowed    Spouse name: Not on file   Number of children: 1   Years of education: Not on file   Highest education level: Associate degree: occupational, Scientist, product/process development, or vocational program  Occupational History   Occupation: CMA    Employer: [employer]  Tobacco Use   Smoking status: Never    Passive exposure: Never   Smokeless tobacco: Never  Vaping Use   Vaping Use: Never used  Substance and Sexual Activity   Alcohol use: No    Alcohol/week: 0.0 standard drinks of alcohol   Drug use: No   Sexual activity: Not Currently    Partners: Male    Birth control/protection: Post-menopausal    Comment: 1st intercourse- 18, partners- 1,   Other Topics Concern   Not on file  Social History Narrative   Lives alone, with dog.     Widow.  One son.    Caffienated drinks-no   Seat belt use often-yes   Regular Exercise-yes   Smoke alarm in the home-yes   Firearms/guns in the  home-no   History of physical abuse-no            Social Determinants of Health   Financial Resource Strain: Low Risk  (05/30/2022)   Overall Financial Resource Strain (CARDIA)    Difficulty of Paying Living Expenses: Not very hard  Food Insecurity:  No Food Insecurity (06/03/2022)   Hunger Vital Sign    Worried About Running Out of Food in the Last Year: Never true    Ran Out of Food in the Last Year: Never true  Transportation Needs: No Transportation Needs (06/03/2022)   PRAPARE - Administrator, Civil Service (Medical): No    Lack of Transportation (Non-Medical): No  Physical Activity: Sufficiently Active (05/30/2022)   Exercise Vital Sign    Days of Exercise per Week: 7 days    Minutes of Exercise per Session: 40 min  Stress: No Stress Concern Present (05/30/2022)   [employer] of Occupational Health - Occupational Stress Questionnaire    Feeling of Stress : Only a little  Social Connections: Moderately Integrated (05/30/2022)   Social Connection and Isolation Panel [NHANES]    Frequency of Communication with Friends and Family: More than three times a week    Frequency of Social Gatherings with Friends and Family: Three times a week    Attends Religious Services: More than 4 times per year    Active Member of Clubs or Organizations: Yes    Attends Banker Meetings: More than 4 times per year    Marital Status: Widowed    Tobacco Counseling Counseling given: Not Answered   Clinical Intake:  Pre-visit preparation completed: Yes  Pain : No/denies pain     BMI - recorded: 29.38 Nutritional Status: BMI 25 -29 Overweight Nutritional Risks: None Diabetes: No  How often do you need to have someone help you when you read instructions, pamphlets, or other written materials from your doctor or pharmacy?: 1 - Never  Diabetic?   No  Interpreter Needed?: No  Information entered by :: Kandis Cocking, CMA   Activities of Daily Living     06/03/2022   10:23 AM 05/30/2022    4:40 PM  In your present state of health, do you have any difficulty performing the following activities:  Hearing? 0 0  Vision? 0 0  Difficulty concentrating or making decisions? 0 0  Walking or climbing stairs? 1 1  Comment per patient unable to climb due to heart issues   Dressing or bathing? 0 0  Doing errands, shopping? 0 0  Preparing Food and eating ? N N  Using the Toilet? N N  In the past six months, have you accidently leaked urine? N N  Do you have problems with loss of bowel control? N N  Managing your Medications? N N  Managing your Finances? N N  Housekeeping or managing your Housekeeping? N N    Patient Care Team: Zola Button, Grayling Congress, DO as PCP - General (Family Medicine) Rollene Rotunda, MD as PCP - Cardiology (Cardiology) Estanislado Emms, MD as Consulting Physician (Nephrology) Belva Agee, MD as Consulting Physician (Urology) Oretha Milch, MD as Consulting Physician (Pulmonary Disease)  Indicate any recent Medical Services you may have received from other than [facility] providers in the past year (date may be approximate).     Assessment:   This is a routine wellness examination for Kelsey Church.  Hearing/Vision screen Hearing Screening - Comments:: Denies hearing difficulties Vision Screening - Comments:: Wears rx glasses - up to date with routine eye exams with  [facility] in [place] on [address]  Dietary issues and exercise activities discussed: Current Exercise Habits: The patient does not participate in regular exercise at present (patient walks dog), Type of exercise: walking, Time (Minutes): 60, Frequency (Times/Week): 7, Weekly Exercise (Minutes/Week): 420,  Intensity: Mild, Exercise limited by: None identified   Goals Addressed             This Visit's Progress    Patient Stated   On track    Increase exercise & drink more water.   Wants to lose 10 pounds       Depression Screen    06/03/2022    10:30 AM 06/02/2022    1:34 PM 05/31/2021    8:40 AM 05/04/2021    5:35 PM 08/27/2020    1:25 PM 12/26/2018    9:03 AM 04/03/2016   12:49 PM  PHQ 2/9 Scores  PHQ - 2 Score 1 0 0 1 0 0 0  PHQ- 9 Score     0      Fall Risk    06/03/2022   10:16 AM 06/02/2022    1:33 PM 05/30/2022    4:40 PM 05/31/2021    8:36 AM 05/04/2021    5:35 PM  Fall Risk   Falls in the past year? 0 1  1 0  Number falls in past yr: 0 0 1 1 0  Injury with Fall? 0 0  Comment broken left pinky finger on fall      Risk for fall due to : No Fall Risks History of fall(s)  History of fall(s) No Fall Risks  Follow up Falls prevention discussed Falls evaluation completed  Falls prevention discussed Falls evaluation completed    FALL RISK PREVENTION PERTAINING TO THE HOME:  Any stairs in or around the home? No  If so, are there any without handrails? No  Home free of loose throw rugs in walkways, pet beds, electrical cords, etc? Yes  Adequate lighting in your home to reduce risk of falls? Yes   ASSISTIVE DEVICES UTILIZED TO PREVENT FALLS:  Life alert? No  Use of a cane, walker or w/c? No  Grab bars in the bathroom? Yes  Shower chair or bench in shower? Yes  Elevated toilet seat or a handicapped toilet? No   TIMED UP AND GO:  Was the test performed? No .   Televisit    Cognitive Function:        06/03/2022   10:27 AM  6CIT Screen  What Year? 0 points  What month? 0 points  What time? 0 points  Count back from 20 0 points  Months in reverse 0 points  Repeat phrase 0 points  Total Score 0 points    Immunizations Immunization History  Administered Date(s) Administered   Fluad Quad(high Dose 65+) 12/26/2018, 12/07/2020, 12/02/2021   Hepatitis A 08/07/2008, 07/28/2010   Hepatitis A, Adult 08/07/2008, 07/28/2010   Hepatitis B 10/15/1984, 01/14/1985, 07/15/1996, 08/24/2009   Hepatitis B, ADULT 10/15/1984, 01/14/1985, 07/15/1996, 08/24/2009   Influenza Nasal 12/16/2011   Influenza Whole  10/16/2010   Influenza, High Dose Seasonal PF 11/27/2017   Influenza,inj,Quad PF,6+ Mos 12/03/2013, 12/10/2014   Influenza-Unspecified 10/15/2016   MMR 08/13/2008   OPV 08/12/1958   PFIZER(Purple Top)SARS-COV-2 Vaccination 03/22/2019, 04/16/2019, 02/13/2020   Pfizer Covid-19 Vaccine Bivalent Booster 38yrs & up 02/16/2021   Pneumococcal Conjugate-13 03/29/2016   Pneumococcal Polysaccharide-23 04/17/2017   Td 07/04/2006   Td (Adult), 2 Lf Tetanus Toxid, Preservative Free 07/04/2006   Tdap 05/16/2013   Typhoid Inactivated 07/18/2012   Yellow Fever 07/18/2012   Zoster, Live 09/06/2012    TDAP status: Up to date  Flu Vaccine status: Up to date  Pneumococcal vaccine status: Up to date  Covid-19 vaccine status:  Information provided on how to obtain vaccines.   Qualifies for Shingles Vaccine? Yes   Zostavax completed Yes   Shingrix Completed?: No.    Education has been provided regarding the importance of this vaccine. Patient has been advised to call insurance company to determine out of pocket expense if they have not yet received this vaccine. Advised may also receive vaccine at local pharmacy or Health Dept. Verbalized acceptance and understanding.  Screening Tests Health Maintenance  Topic Date Due   Zoster Vaccines- Shingrix (1 of 2) Never done   COVID-19 Vaccine (5 - 2023-24 season) 10/15/2021   INFLUENZA VACCINE  09/15/2022   DTaP/Tdap/Td (3 - Td or Tdap) 05/17/2023   Medicare Annual Wellness (AWV)  06/03/2023   MAMMOGRAM  07/15/2023   COLONOSCOPY (Pts 45-27yrs Insurance coverage will need to be confirmed)  06/03/2025   Pneumonia Vaccine 13+ Years old  Completed   DEXA SCAN  Completed   Hepatitis C Screening  Completed   HPV VACCINES  Aged Out    Health Maintenance  Health Maintenance Due  Topic Date Due   Zoster Vaccines- Shingrix (1 of 2) Never done   COVID-19 Vaccine (5 - 2023-24 season) 10/15/2021    Colorectal cancer screening: Type of screening:  Colonoscopy. Completed 06/03/20 . Repeat every 5 years  Mammogram status: Ordered 06/03/22   . Pt provided with contact info and advised to call to schedule appt.   Bone Density status: Completed 07/14/21. Results reflect: Bone density results: NORMAL. Repeat every 2 years.  Lung Cancer Screening: (Low Dose CT Chest recommended if Age 72-80 years, 30 pack-year currently smoking OR have quit w/in 15years.) does not qualify.   Lung Cancer Screening Referral: N/A  Additional Screening:  Hepatitis C Screening: does qualify; Completed 04/17/2017  Vision Screening: Recommended annual ophthalmology exams for early detection of glaucoma and other disorders of the eye. Is the patient up to date with their annual eye exam?  Yes   Who is the provider or what is the name of the office in which the patient attends annual eye exams?  [facility] in [place]  If pt is not established with a provider, would they like to be referred to a provider to establish care? No .   Dental Screening: Recommended annual dental exams for proper oral hygiene  Community Resource Referral / Chronic Care Management: CRR required this visit?  No   CCM required this visit?  No      Plan:     I have personally reviewed and noted the following in the patient's chart:   Medical and social history Use of alcohol, tobacco or illicit drugs  Current medications and supplements including opioid prescriptions. Patient is not currently taking opioid prescriptions. Functional ability and status Nutritional status Physical activity Advanced directives List of other physicians Hospitalizations, surgeries, and ER visits in previous 12 months Vitals Screenings to include cognitive, depression, and falls Referrals and appointments  In addition, I have reviewed and discussed with patient certain preventive protocols, quality metrics, and best practice recommendations. A written personalized care plan for preventive  services as well as general preventive health recommendations were provided to patient.     Milus Mallick, CMA   06/03/2022   Nurse Notes: Mammogram order placed to be scheduled after 07/15/22

## 2022-06-03 NOTE — Telephone Encounter (Signed)
Contacted Kelsey Church to schedule their annual wellness visit. Appointment made for 06/06/2022.  [name]; Care Guide Ambulatory Clinical Support [place] [facility] Direct Dial: [phone]

## 2022-06-06 ENCOUNTER — Ambulatory Visit: Payer: Medicare Other | Admitting: Cardiology

## 2022-06-07 ENCOUNTER — Other Ambulatory Visit: Payer: Self-pay | Admitting: Cardiology

## 2022-06-07 DIAGNOSIS — E785 Hyperlipidemia, unspecified: Secondary | ICD-10-CM

## 2022-06-07 DIAGNOSIS — I25118 Atherosclerotic heart disease of native coronary artery with other forms of angina pectoris: Secondary | ICD-10-CM

## 2022-06-08 ENCOUNTER — Encounter: Payer: Self-pay | Admitting: Physician Assistant

## 2022-06-08 ENCOUNTER — Ambulatory Visit (INDEPENDENT_AMBULATORY_CARE_PROVIDER_SITE_OTHER): Payer: Medicare Other | Admitting: Physician Assistant

## 2022-06-08 ENCOUNTER — Other Ambulatory Visit (INDEPENDENT_AMBULATORY_CARE_PROVIDER_SITE_OTHER): Payer: Medicare Other

## 2022-06-08 VITALS — BP 102/56 | HR 72 | Ht 65.0 in | Wt 187.0 lb

## 2022-06-08 DIAGNOSIS — K5900 Constipation, unspecified: Secondary | ICD-10-CM

## 2022-06-08 LAB — TSH: TSH: 1.4 u[IU]/mL (ref 0.35–5.50)

## 2022-06-08 NOTE — Progress Notes (Signed)
Subjective:    Patient ID: Kelsey Church, female    DOB: 1950/06/03, 72 y.o.   MRN: [id number]  HPI  "Kelsey Church" is a pleasant 72 year old white female, established with Dr. Rhea Belton who was last seen in the office in December 2022.  She comes in today with new complaint of constipation. She had previously been seen for issues with GERD and esophageal stricture, and had undergone EGD in November 2022 with finding of a benign distal esophageal stricture which was TTS dilated from 15 to 18 mm, noted a small hiatal hernia, and an 8 mm gastric polyp which was biopsied and found to be hyperplastic.  Gastric biopsy showed mild gastritis, duodenal biopsies were negative, no H. pylori. She is not currently having any issues with acid reflux and is using Protonix as needed. She says she had been diagnosed with IBS-D way back in the 1980s, eventually got that turned around with alteration of her diet etc. and then has not had any problems with her bowels for a long time.  She had surgery for renal cell CA in 2021.  He says primarily since the beginning of this year she has been having trouble with constipation. She is currently trying a combination of things using 30 to 45 cc of mineral oil as needed, taking Colace 1 or 2 daily and is also tried some Dulcolax though that seems to cause a lot of cramping. She may go 4 to 5 days between bowel movements and says she does not have any urgency for bowel movements though does have a lot of gas. She did have a good bowel movement this past weekend but has not had any further bowel movement over the past 3 to 4 days.  She will get some associated bloating no abdominal pain. Her most recent new medication was isosorbide which was started about a month ago.  She started Repatha earlier this year.  She is also on metoprolol and ranolazine which may be contributing to constipation. Last colonoscopy had been done in [place] where she was living previously in April 2022.  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  Her other medical problems include hypertension, sleep apnea, degenerative joint disease, osteoarthritis, chronic kidney disease stage IV and the renal cell carcinoma for which she underwent nephrectomy in 2021.  Review of Systems Pertinent positive and negative review of systems were noted in the above HPI section.  All other review of systems was otherwise negative.   Outpatient Encounter Medications as of 06/08/2022  Medication Sig   acetaminophen (TYLENOL) 650 MG CR tablet Take 1,300 mg by mouth every 8 (eight) hours as needed for pain.    aspirin EC 81 MG tablet Take 1 tablet (81 mg total) by mouth daily.   cholecalciferol (VITAMIN D3) 25 MCG (1000 UNIT) tablet Take 1,000 Units by mouth daily.   citalopram (CELEXA) 20 MG tablet 1 1/2 tab po qd   docusate sodium (COLACE) 100 MG capsule Take by mouth. 1 capsule daily sometimes twice a day   Evolocumab (REPATHA SURECLICK) 140 MG/ML SOAJ INJECT INTO THE SKIN EVERY 14 DAYS   isosorbide mononitrate (IMDUR) 60 MG 24 hr tablet Take 1 tablet (60 mg total) by mouth daily.   metoprolol succinate (TOPROL-XL) 25 MG 24 hr tablet Take 2 tablets (50 mg total) by mouth in the morning AND 1 tablet (25 mg  total) every evening.   pantoprazole (PROTONIX) 40 MG tablet TAKE 1 TABLET (40 MG TOTAL) BY MOUTH TWICE A DAY BEFORE MEALS   ranolazine (RANEXA) 500 MG 12 hr tablet Take 1 tablet (500 mg total) by mouth 2 (two) times daily.   vitamin B-12 (CYANOCOBALAMIN) 500 MCG tablet Take 500 mcg by mouth daily.   nitroGLYCERIN (NITROSTAT) 0.4 MG SL tablet Place 1 tablet (0.4 mg total) under the tongue every 5 (five) minutes as needed for chest pain. (Patient not taking: Reported on 06/08/2022)   No facility-administered encounter medications on file as of 06/08/2022.   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   Degenerative lumbar disc 11/01/2016   Gastroesophageal reflux disease with esophagitis 12/10/2014   Moderate osteopenia 12/10/2014   Vitamin B12 deficiency 12/10/2014   OA (osteoarthritis) of Church 12/16/2013   Prediabetes 12/03/2013   Other screening mammogram 09/25/2012   DJD (degenerative joint disease) of Church 09/09/2011   OSA (obstructive sleep apnea) 07/29/2011   Essential hypertension, benign 07/29/2011   Social History   Socioeconomic History   Marital status: Widowed    Spouse name: Not on file   Number of children: 1   Years of education: Not on file   Highest education level: Associate degree: occupational, Scientist, product/process development, or vocational program  Occupational History   Occupation: CMA    Employer: [employer]  Tobacco Use   Smoking status: Never    Passive exposure: Never   Smokeless tobacco: Never  Vaping Use   Vaping Use: Never used  Substance and Sexual Activity   Alcohol use: No    Alcohol/week: 0.0 standard drinks of alcohol   Drug use: No   Sexual activity: Not Currently    Partners: Male    Birth control/protection: Post-menopausal    Comment: 1st intercourse- 18, partners- 1,   Other Topics Concern   Not on file  Social History Narrative   Lives alone, with dog.     Widow.  One son.    Caffienated drinks-no   Seat belt use often-yes   Regular Exercise-yes   Smoke alarm in the home-yes   Firearms/guns in the home-no   History of physical abuse-no            Social Determinants of Health    Financial Resource Strain: Low Risk  (05/30/2022)   Overall Financial Resource Strain (CARDIA)    Difficulty of Paying Living Expenses: Not very hard  Food Insecurity: No Food Insecurity (06/03/2022)   Hunger Vital Sign    Worried About Running Out of Food in the Last Year: Never true    Ran Out of Food in the Last Year: Never true  Transportation Needs: No Transportation Needs (06/03/2022)   PRAPARE - Administrator, Civil Service (Medical): No    Lack of Transportation (Non-Medical): No  Physical Activity: Sufficiently Active (05/30/2022)   Exercise Vital Sign    Days of Exercise per Week: 7 days    Minutes of Exercise per Session: 40 min  Stress: No  Stress Concern Present (05/30/2022)   [employer] of Occupational Health - Occupational Stress Questionnaire    Feeling of Stress : Only a little  Social Connections: Moderately Integrated (05/30/2022)   Social Connection and Isolation Panel [NHANES]    Frequency of Communication with Friends and Family: More than three times a week    Frequency of Social Gatherings with Friends and Family: Three times a week    Attends Religious Services: More than 4 times per year    Active Member of Clubs or Organizations: Yes    Attends Banker Meetings: More than 4 times per year    Marital Status: Widowed  Intimate Partner Violence: Not At Risk (06/03/2022)   Humiliation, Afraid, Rape, and Kick questionnaire    Fear of Current or Ex-Partner: No    Emotionally Abused: No    Physically Abused: No    Sexually Abused: No    Ms. Balicki's family history includes Breast cancer in her maternal grandmother and mother; Cancer in her maternal grandmother; Colon polyps in her sister; Diabetes in her sister; Heart attack (age of onset: 80) in her father; Heart disease in her father; Hypertension in her father and sister; Osteoporosis in her mother; Stroke in her sister; Thyroid cancer in her maternal grandmother.       Objective:    Vitals:   06/08/22 1357  BP: (!) 102/56  Pulse: 72    Physical Exam Well-developed well-nourished folder WF in no acute distress.  Height, Weight,187  BMI 31.2  HEENT; nontraumatic normocephalic, EOMI, PE R LA, sclera anicteric. Oropharynx; not done Neck; supple, no JVD Cardiovascular; regular rate and rhythm with S1-S2, no murmur rub or gallop Pulmonary; Clear bilaterally Abdomen; soft,obese , nontender, nondistended, no palpable mass or hepatosplenomegaly, bowel sounds are active Rectal; not done today Skin; benign exam, no jaundice rash or appreciable lesions Extremities; no clubbing cyanosis or edema skin warm and dry Neuro/Psych; alert and oriented x4, grossly nonfocal mood and affect appropriate        Assessment & Plan:   #19 72 year old white female with new onset of constipation over the past 4 months or so.  Previously having normal bowel movements and has a very remote history of IBS-D which had not been an issue for over 20 years. Currently using a variety of over-the-counter products and still usually only having a bowel movement every 3 to 4 days.  Suspect medications are contributing to constipation. She did have fairly recent colonoscopy in April 2022 which was negative other than a prominent ileocecal valve which was biopsied and found to be a lipoma.  Rule out hypothyroidism  #2 history of chronic GERD and previous esophageal stricture requiring dilation November 2022-currently only using Protonix as needed and not having any regular acid reflux symptoms no current dysphagia  #3 hypertension 4.  Sleep apnea 5.  Osteoarthritis 6.  Chronic kidney disease stage IV 7.  Coronary artery disease 8.  History of renal cell carcinoma status post resection 2021  Plan; check TSH Patient will be instructed in a MiraLAX purge, then start MiraLAX 17 g in 8 ounces of fluid daily.  We discussed titrating the dose up to 1-1/2-2 doses per day if needed. I  have asked her to call back in about 2 weeks and speak to my nurse with an update.  If she is not having good results with MiraLAX then we can consider trial of Amitiza or Linzess, and discuss possible repeat colonoscopy.   Danyal Whitenack S Richards Pherigo PA-C  06/08/2022   Cc: Zola Button, [place]ling Congress, *

## 2022-06-08 NOTE — Patient Instructions (Signed)
_______________________________________________________  If your blood pressure at your visit was 140/90 or greater, please contact your primary care physician to follow up on this. _______________________________________________________  If you are age 72 or older, your body mass index should be between 23-30. Your Body mass index is 31.12 kg/m. If this is out of the aforementioned range listed, please consider follow up with your Primary Care Provider. ________________________________________________________  The [facility] providers would like to encourage you to use [facility] to communicate with providers for non-urgent requests or questions.  Due to long hold times on the telephone, sending your provider a message by [facility] may be a faster and more efficient way to get a response.  Please allow 48 business hours for a response.  Please remember that this is for non-urgent requests.  _______________________________________________________  Your provider has requested that you go to the basement level for lab work before leaving today. Press "B" on the elevator. The lab is located at the first door on the left as you exit the elevator.  Due to recent changes in healthcare laws, you may see the results of your imaging and laboratory studies on MyChart before your provider has had a chance to review them.  We understand that in some cases there may be results that are confusing or concerning to you. Not all laboratory results come back in the same time frame and the provider may be waiting for multiple results in order to interpret others.  Please give [place] 48 hours in order for your provider to thoroughly review all the results before contacting the office for clarification of your results.   It has been recommended that you complete a bowel purge (to clean out your bowels).   Please do the following: Purchase a bottle of Miralax over the counter as well as a box of 5 mg dulcolax tablets.  Take 4  dulcolax tablets. Wait 1 hour. You will then drink 6-8 capfuls of Miralax mixed in an adequate amount of water/juice/gatorade (you may choose which of these liquids to drink) over the next 2-3 hours. You should expect results within 1 to 6 hours after completing the bowel purge.  Then start using Miralax 17 grams (1 capful) in 8 ounces of fluid every day.  You can increase to 1.5 capfuls daily if needed.  Please call our office or send a MyChart message in 1 to 2 weeks and speak with Beth to report on how you are feeling if things are not improving.  Thank you for entrusting me with your care and choosing [facility].  Amy Esterwood, PA-C

## 2022-06-10 NOTE — Progress Notes (Signed)
Addendum: Reviewed and agree with assessment and management plan. Jimmylee Ratterree M, MD  

## 2022-06-16 ENCOUNTER — Encounter (HOSPITAL_BASED_OUTPATIENT_CLINIC_OR_DEPARTMENT_OTHER): Payer: Self-pay | Admitting: Pulmonary Disease

## 2022-06-16 DIAGNOSIS — G4733 Obstructive sleep apnea (adult) (pediatric): Secondary | ICD-10-CM

## 2022-06-17 ENCOUNTER — Ambulatory Visit
Admission: RE | Admit: 2022-06-17 | Discharge: 2022-06-17 | Disposition: A | Discharge status: Home / Self Care | Payer: Medicare Other | Source: Ambulatory Visit | Attending: Urology | Admitting: Urology

## 2022-06-17 DIAGNOSIS — Z85528 Personal history of other malignant neoplasm of kidney: Secondary | ICD-10-CM

## 2022-06-17 DIAGNOSIS — Z85820 Personal history of malignant melanoma of skin: Secondary | ICD-10-CM | POA: Diagnosis not present

## 2022-06-17 DIAGNOSIS — Z85048 Personal history of other malignant neoplasm of rectum, rectosigmoid junction, and anus: Secondary | ICD-10-CM | POA: Diagnosis not present

## 2022-06-17 MED ORDER — GADOPICLENOL 0.5 MMOL/ML IV SOLN
8.0000 mL | Freq: Once | INTRAVENOUS | Status: AC | PRN
  Administered 2022-06-17: 8 mL via INTRAVENOUS

## 2022-06-22 ENCOUNTER — Other Ambulatory Visit: Payer: Self-pay

## 2022-06-22 DIAGNOSIS — R935 Abnormal findings on diagnostic imaging of other abdominal regions, including retroperitoneum: Secondary | ICD-10-CM

## 2022-06-23 ENCOUNTER — Other Ambulatory Visit (INDEPENDENT_AMBULATORY_CARE_PROVIDER_SITE_OTHER): Payer: Medicare Other

## 2022-06-23 DIAGNOSIS — R935 Abnormal findings on diagnostic imaging of other abdominal regions, including retroperitoneum: Secondary | ICD-10-CM

## 2022-06-23 LAB — HEPATIC FUNCTION PANEL
ALT: 8 U/L (ref 0–35)
AST: 12 U/L (ref 0–37)
Albumin: 4.2 g/dL (ref 3.5–5.2)
Alkaline Phosphatase: 86 U/L (ref 39–117)
Bilirubin, Direct: 0.1 mg/dL (ref 0.0–0.3)
Total Bilirubin: 0.3 mg/dL (ref 0.2–1.2)
Total Protein: 6.8 g/dL (ref 6.0–8.3)

## 2022-06-24 DIAGNOSIS — N184 Chronic kidney disease, stage 4 (severe): Secondary | ICD-10-CM | POA: Diagnosis not present

## 2022-06-24 DIAGNOSIS — Z85528 Personal history of other malignant neoplasm of kidney: Secondary | ICD-10-CM | POA: Diagnosis not present

## 2022-06-27 ENCOUNTER — Ambulatory Visit: Payer: Medicare Other

## 2022-06-27 DIAGNOSIS — G4733 Obstructive sleep apnea (adult) (pediatric): Secondary | ICD-10-CM

## 2022-06-30 DIAGNOSIS — G4733 Obstructive sleep apnea (adult) (pediatric): Secondary | ICD-10-CM | POA: Diagnosis not present

## 2022-07-01 ENCOUNTER — Telehealth: Payer: Self-pay | Admitting: Pulmonary Disease

## 2022-07-01 DIAGNOSIS — Z905 Acquired absence of kidney: Secondary | ICD-10-CM | POA: Diagnosis not present

## 2022-07-01 DIAGNOSIS — N184 Chronic kidney disease, stage 4 (severe): Secondary | ICD-10-CM | POA: Diagnosis not present

## 2022-07-01 DIAGNOSIS — I2510 Atherosclerotic heart disease of native coronary artery without angina pectoris: Secondary | ICD-10-CM | POA: Diagnosis not present

## 2022-07-01 DIAGNOSIS — D631 Anemia in chronic kidney disease: Secondary | ICD-10-CM | POA: Diagnosis not present

## 2022-07-01 DIAGNOSIS — C642 Malignant neoplasm of left kidney, except renal pelvis: Secondary | ICD-10-CM | POA: Diagnosis not present

## 2022-07-01 NOTE — Telephone Encounter (Signed)
HST showed very mild OSA with AHI 11/ hr  Unfortunately this degree of mild sleep apnea and does not need inspire implantation and does not qualify. Our options include continuing CPAP therapy at lower pressure or trying dental appliance. We can schedule routine follow-up to discuss if she has more questions

## 2022-07-04 ENCOUNTER — Other Ambulatory Visit: Payer: Self-pay | Admitting: Cardiology

## 2022-07-05 NOTE — Telephone Encounter (Signed)
Called and spoke with patient. Patient verbalized understanding. Patient stated she will just continue using the cpap machine.   Nothing further needed.

## 2022-07-28 ENCOUNTER — Ambulatory Visit
Admission: RE | Admit: 2022-07-28 | Discharge: 2022-07-28 | Disposition: A | Discharge status: Home / Self Care | Payer: Medicare Other | Source: Ambulatory Visit | Attending: Family Medicine | Admitting: Family Medicine

## 2022-07-28 DIAGNOSIS — Z1231 Encounter for screening mammogram for malignant neoplasm of breast: Secondary | ICD-10-CM | POA: Diagnosis not present

## 2022-08-10 ENCOUNTER — Encounter: Payer: Self-pay | Admitting: Cardiology

## 2022-08-10 MED ORDER — ISOSORBIDE MONONITRATE ER 60 MG PO TB24: 60.0000 mg | ORAL_TABLET | Freq: Every day | ORAL | 3 refills | Status: DC

## 2022-08-24 DIAGNOSIS — Z7982 Long term (current) use of aspirin: Secondary | ICD-10-CM | POA: Diagnosis not present

## 2022-08-24 DIAGNOSIS — M5116 Intervertebral disc disorders with radiculopathy, lumbar region: Secondary | ICD-10-CM | POA: Diagnosis not present

## 2022-08-24 DIAGNOSIS — M545 Low back pain, unspecified: Secondary | ICD-10-CM | POA: Diagnosis not present

## 2022-08-24 DIAGNOSIS — Z79899 Other long term (current) drug therapy: Secondary | ICD-10-CM | POA: Diagnosis not present

## 2022-08-24 DIAGNOSIS — M549 Dorsalgia, unspecified: Secondary | ICD-10-CM | POA: Diagnosis not present

## 2022-08-24 DIAGNOSIS — M5137 Other intervertebral disc degeneration, lumbosacral region: Secondary | ICD-10-CM | POA: Diagnosis not present

## 2022-08-24 DIAGNOSIS — M4726 Other spondylosis with radiculopathy, lumbar region: Secondary | ICD-10-CM | POA: Diagnosis not present

## 2022-08-24 DIAGNOSIS — M48061 Spinal stenosis, lumbar region without neurogenic claudication: Secondary | ICD-10-CM | POA: Diagnosis not present

## 2022-08-24 DIAGNOSIS — M5136 Other intervertebral disc degeneration, lumbar region: Secondary | ICD-10-CM | POA: Diagnosis not present

## 2022-09-20 DIAGNOSIS — N184 Chronic kidney disease, stage 4 (severe): Secondary | ICD-10-CM | POA: Diagnosis not present

## 2022-09-26 ENCOUNTER — Encounter: Payer: Self-pay | Admitting: Cardiology

## 2022-09-27 DIAGNOSIS — Z905 Acquired absence of kidney: Secondary | ICD-10-CM | POA: Diagnosis not present

## 2022-09-27 DIAGNOSIS — D631 Anemia in chronic kidney disease: Secondary | ICD-10-CM | POA: Diagnosis not present

## 2022-09-27 DIAGNOSIS — N184 Chronic kidney disease, stage 4 (severe): Secondary | ICD-10-CM | POA: Diagnosis not present

## 2022-09-27 DIAGNOSIS — C642 Malignant neoplasm of left kidney, except renal pelvis: Secondary | ICD-10-CM | POA: Diagnosis not present

## 2022-09-27 DIAGNOSIS — I2510 Atherosclerotic heart disease of native coronary artery without angina pectoris: Secondary | ICD-10-CM | POA: Diagnosis not present

## 2022-09-29 DIAGNOSIS — L570 Actinic keratosis: Secondary | ICD-10-CM | POA: Diagnosis not present

## 2022-09-29 DIAGNOSIS — L814 Other melanin hyperpigmentation: Secondary | ICD-10-CM | POA: Diagnosis not present

## 2022-09-29 DIAGNOSIS — L821 Other seborrheic keratosis: Secondary | ICD-10-CM | POA: Diagnosis not present

## 2022-09-29 DIAGNOSIS — C44311 Basal cell carcinoma of skin of nose: Secondary | ICD-10-CM | POA: Diagnosis not present

## 2022-09-29 DIAGNOSIS — D225 Melanocytic nevi of trunk: Secondary | ICD-10-CM | POA: Diagnosis not present

## 2022-09-29 DIAGNOSIS — Z85828 Personal history of other malignant neoplasm of skin: Secondary | ICD-10-CM | POA: Diagnosis not present

## 2022-11-08 DIAGNOSIS — C44311 Basal cell carcinoma of skin of nose: Secondary | ICD-10-CM | POA: Diagnosis not present

## 2022-11-08 DIAGNOSIS — Z85828 Personal history of other malignant neoplasm of skin: Secondary | ICD-10-CM | POA: Diagnosis not present

## 2022-11-24 ENCOUNTER — Other Ambulatory Visit: Payer: Self-pay | Admitting: Specialist

## 2022-11-24 DIAGNOSIS — Z85528 Personal history of other malignant neoplasm of kidney: Secondary | ICD-10-CM

## 2022-11-27 NOTE — Progress Notes (Unsigned)
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 Antoine Poche on 05/30/2022.  At that time the patient continued to have discomfort in her chest but did not want any further aggressive therapy, because to a report any worsening of exertional angina at which time medication titration would be completed.  Past Medical History    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/10/13   Cancer (HCC)    melanoma in 2 sights   Complication of anesthesia    "chills every evening at sundown x 2 weeks"- no fever   Depression    Dysphagia    History of kidney cancer    left   History of shingles    NO RESIDUAL PROBLEMS   Hypertension    Pain    LOWER BACK PAIN   Sleep apnea    CPAP   Past Surgical History:  Procedure Laterality Date   APPENDECTOMY  1958   COLONOSCOPY     COLONOSCOPY W/ POLYPECTOMY     7 polyps   ESOPHAGEAL MANOMETRY N/A 03/17/2021   Procedure: ESOPHAGEAL MANOMETRY (EM);  Surgeon: Beverley Fiedler, MD;  Location: [facility];  Service: Gastroenterology;  Laterality: N/A;   FOOT SURGERY Right    removal plantar wart   GALLBLADDER SURGERY  02/14/1990   INJECTION KNEE  09/2011   right    KNEE ARTHROSCOPY  1996   LEFT HEART CATH AND CORONARY ANGIOGRAPHY N/A 07/11/2019   Procedure: LEFT HEART CATH AND CORONARY ANGIOGRAPHY;  Surgeon: Lyn Records, MD;  Location: [facility];  Service: Cardiovascular;  Laterality: N/A;   MELANOMA EXCISION  1992   right chin, back   NEPHRECTOMY Left 2022   POLYPECTOMY     SALPINGOOPHORECTOMY  2009   Right    TOTAL KNEE ARTHROPLASTY Right 06/10/2013   Procedure: RIGHT TOTAL KNEE ARTHROPLASTY;  Surgeon: Loanne Drilling, MD;  Location: [facility];  Service: Orthopedics;  Laterality: Right;   TOTAL KNEE ARTHROPLASTY Left 12/16/2013   Procedure: LEFT TOTAL KNEE ARTHROPLASTY;  Surgeon: Loanne Drilling, MD;  Location: [facility];  Service: Orthopedics;  Laterality: Left;    Allergies  Allergies  Allergen Reactions   Atorvastatin    Rosuvastatin     40mg --Creatinine elevation    History of Present Illness    Kelsey Church returns to the office today for ongoing assessment management of coronary artery disease managed medically as this was small vessel disease and distal disease, she did not wish any more aggressive therapy, chronic angina on nitrates, and dyslipidemia.  Today she complains of rapid palpitations which occur intermittently usually when she is laying down at night trying to go to sleep.  They last approximately 5 to 10 minutes which she describes as "heart pounding or fluttering" which goes away on its own.  She also is walking her dog every day usually about an hour each time sometimes with friends.  About halfway around she has to stop and rest to catch her breath.  She denies any chest pressure or pain associated with this, dizziness, or profound fatigue.  Home Medications    Current Outpatient Medications  Medication Sig Dispense Refill   acetaminophen (TYLENOL) 650 MG CR tablet Take 1,300 mg by mouth every 8 (eight) hours as needed for pain.      aspirin EC 81 MG tablet Take 1 tablet (81 mg total) by mouth daily. 90 tablet 3   cholecalciferol (VITAMIN D3) 25 MCG (1000 UNIT) tablet Take 1,000 Units by mouth daily.     citalopram (CELEXA) 20 MG tablet 1 1/2 tab po qd 135 tablet 1   docusate sodium (COLACE) 100 MG capsule Take by mouth. 1 capsule daily sometimes twice a day     Evolocumab (REPATHA SURECLICK) 140 MG/ML SOAJ INJECT INTO THE SKIN EVERY 14 DAYS 6 mL 3   isosorbide  mononitrate (IMDUR) 60 MG 24 hr tablet Take 1 tablet (60 mg total) by mouth daily. 90 tablet 3   metoprolol succinate (TOPROL-XL) 25 MG 24 hr tablet TAKE 2 TABLETS IN THE MORNING AND 1 TABLET EVERY  EVENING 270 tablet 3   pantoprazole (PROTONIX) 40 MG tablet TAKE 1 TABLET (40 MG TOTAL) BY MOUTH TWICE A DAY BEFORE MEALS 180 tablet 1   ranolazine (RANEXA) 500 MG 12 hr tablet TAKE 1 TABLET TWICE A DAY 180 tablet 3   vitamin B-12 (CYANOCOBALAMIN) 500 MCG tablet Take 500 mcg by mouth daily.     nitroGLYCERIN (NITROSTAT) 0.4 MG SL tablet Place 1 tablet (0.4 mg total) under the tongue every 5 (five) minutes as needed for chest pain. (Patient not taking: Reported on 06/08/2022) 25 tablet 3   No current facility-administered medications for this visit.     Family History    Family History  Problem Relation Age of Onset   Osteoporosis Mother    Breast cancer Mother    Heart disease Father    Heart attack Father 58       78   Hypertension Father    Stroke Sister    Hypertension Sister    Diabetes Sister    Colon polyps Sister    Cancer Maternal Grandmother        Breast and Uterine Cancer   Breast cancer Maternal Grandmother    Thyroid cancer Maternal Grandmother    She indicated that her mother is deceased. She indicated that her father is deceased. She indicated that the status of her sister is unknown. She indicated that the status of her maternal grandmother is unknown.  Social History    Social History   Socioeconomic History   Marital status: Widowed    Spouse name: Not on file   Number of children: 1   Years of education: Not on file   Highest education level: Associate degree: academic program  Occupational History   Occupation: CMA    Employer: [employer]  Tobacco Use   Smoking status: Never    Passive exposure: Never   Smokeless tobacco: Never  Vaping Use   Vaping status: Never Used  Substance and Sexual Activity   Alcohol use: No    Alcohol/week: 0.0 standard  drinks of alcohol   Drug use: No   Sexual activity: Not Currently    Partners: Male    Birth control/protection: Post-menopausal    Comment: 1st intercourse- 18, partners- 1,   Other Topics Concern   Not on file  Social History Narrative   Lives alone, with dog.     Widow.  One son.    Caffienated drinks-no   Seat belt use often-yes   Regular Exercise-yes  Smoke alarm in the home-yes   Firearms/guns in the home-no   History of physical abuse-no            Social Determinants of Health   Financial Resource Strain: Medium Risk (11/28/2022)   Overall Financial Resource Strain (CARDIA)    Difficulty of Paying Living Expenses: Somewhat hard  Food Insecurity: Food Insecurity Present (11/28/2022)   Hunger Vital Sign    Worried About Running Out of Food in the Last Year: Sometimes true    Ran Out of Food in the Last Year: Never true  Transportation Needs: No Transportation Needs (11/28/2022)   PRAPARE - Administrator, Civil Service (Medical): No    Lack of Transportation (Non-Medical): No  Physical Activity: Sufficiently Active (11/28/2022)   Exercise Vital Sign    Days of Exercise per Week: 7 days    Minutes of Exercise per Session: 50 min  Stress: No Stress Concern Present (11/28/2022)   [employer] of Occupational Health - Occupational Stress Questionnaire    Feeling of Stress : Only a little  Social Connections: Moderately Integrated (11/28/2022)   Social Connection and Isolation Panel [NHANES]    Frequency of Communication with Friends and Family: More than three times a week    Frequency of Social Gatherings with Friends and Family: More than three times a week    Attends Religious Services: More than 4 times per year    Active Member of [employer] or Organizations: Yes    Attends Banker Meetings: More than 4 times per year    Marital Status: Widowed  Intimate Partner Violence: Not At Risk (06/03/2022)   Humiliation, Afraid, Rape, and Kick  questionnaire    Fear of Current or Ex-Partner: No    Emotionally Abused: No    Physically Abused: No    Sexually Abused: No     Review of Systems    General:  No chills, fever, night sweats or weight changes.  Cardiovascular:  No chest pain, dyspnea on exertion, edema, orthopnea, palpitations, paroxysmal nocturnal dyspnea. Dermatological: No rash, lesions/masses Respiratory: No cough, dyspnea Urologic: No hematuria, dysuria Abdominal:   No nausea, vomiting, diarrhea, bright red blood per rectum, melena, or hematemesis Neurologic:  No visual changes, wkns, changes in mental status. All other systems reviewed and are otherwise negative except as noted above.       Physical Exam    VS:  BP 126/72 (BP Location: Left Arm, Patient Position: Sitting, Cuff Size: Normal)   Pulse 61   Ht 5\' 6"  (1.676 m)   Wt 182 lb (82.6 kg)   SpO2 96%   BMI 29.38 kg/m  , BMI Body mass index is 29.38 kg/m.     GEN: Well nourished, well developed, in no acute distress. HEENT: normal. Neck: Supple, no JVD, carotid bruits, or masses. Cardiac: IRRR, no murmurs, rubs, or gallops. No clubbing, cyanosis, edema.  Radials/DP/PT 2+ and equal bilaterally.  Respiratory:  Respirations regular and unlabored, expiratory wheezes are noted bilaterally GI: Soft, nontender, nondistended, BS + x 4. MS: no deformity or atrophy. Skin: warm and dry, no rash. Neuro:  Strength and sensation are intact. Psych: Normal affect.      Lab Results  Component Value Date   WBC 5.8 04/28/2022   HGB 12.5 04/28/2022   HCT 39.1 04/28/2022   MCV 84 04/28/2022   PLT 276 04/28/2022   Lab Results  Component Value Date   CREATININE 1.88 (H) 04/28/2022   BUN 32 (H) 04/28/2022  NA 142 04/28/2022   K 4.6 04/28/2022   CL 106 04/28/2022   CO2 24 04/28/2022   Lab Results  Component Value Date   ALT 8 06/23/2022   AST 12 06/23/2022   ALKPHOS 86 06/23/2022   BILITOT 0.3 06/23/2022   Lab Results  Component Value Date    CHOL 132 04/28/2022   HDL 56 04/28/2022   LDLCALC 53 04/28/2022   TRIG 134 04/28/2022   CHOLHDL 2.4 04/28/2022    Lab Results  Component Value Date   HGBA1C 5.9 08/27/2020     Review of Prior Studies    LHC 07/11/2022 Diffuse LAD and right coronary disease with moderate to moderately severe obstruction. Left main is widely patent LAD contains diffuse proximal to mid atherosclerosis with up to 70% stenosis.  The most severe segment is framed by the first and second diagonal each of which is moderate moderate to large in size.  Each diagonal contains 70 to 90% ostial to proximal stenoses.  The LAD wraps around the left ventricular apex. Circumflex contains 40% proximal to mid disease.  A small second obtuse marginal is essentially totally occluded. Right coronary is dominant.  There is diffuse 60% mid vessel stenosis.  Distal RCA into the ostial PDA contains 80% stenosis.  Diffuse disease is noted throughout the PDA up to 75 to 80% in the midsegment. Left ventricular function is normal with EF greater than 60%.  LVEDP is 14 mmHg.   RECOMMENDATIONS:   Aggressive preventive therapy including high intensity statin to lower LDL less than 55, antiplatelet therapy, tight blood pressure control less than 130/80 mmHg, and consideration of SGLT2 therapy if hemoglobin A1c greater than 6.5. Anti-ischemic therapy to include beta-blocker and long-acting nitrates. If continued symptoms which in her case may be dyspnea as an anginal equivalent, consider referral for bypass grafting.  Assessment & Plan   1.  Palpitations: She feels this intermittently when she goes to bed at night.  Not every night lasting approximately 5 minutes and going away on their own.  She is not a heavy caffeine drinker.  She occasionally has ice tea at night for dinner.  We are going to eliminate caffeine from lunchtime forward to see if this helps with the palpitations.  We did do an EKG which showed normal sinus rhythm with  PACs.  This is unchanged from prior EKG and a ZIO monitor in 2020.  No changes in her medications at this time.  2.  Dyspnea on exertion: Possibly from long-haul COVID.  I am hearing bilateral wheezes on expiration.  I will order PFTs to evaluate her lung function and capacity.  It may be possible that she needs an inhaler to help her with her breathing status.  3.  Hyperlipidemia: Remains on Repatha.  Plan to check fasting lipids and LFTs today.  4.  History of kidney cancer: She is followed by nephrology.  Most recent creatinine 1.888 on April 28, 2022.         Signed, Bettey Mare. Liborio Nixon, ANP, AACC   11/29/2022 11:09 AM      Office [phone] Fax [phone]  Notice: This dictation was prepared with Dragon dictation along with smaller phrase technology. Any transcriptional errors that result from this process are unintentional and may not be corrected upon review.

## 2022-11-29 ENCOUNTER — Encounter: Payer: Self-pay | Admitting: Adult Health

## 2022-11-29 ENCOUNTER — Ambulatory Visit: Payer: Medicare Other | Attending: Adult Health | Admitting: Adult Health

## 2022-11-29 VITALS — BP 126/72 | HR 61 | Ht 66.0 in | Wt 182.0 lb

## 2022-11-29 DIAGNOSIS — I10 Essential (primary) hypertension: Secondary | ICD-10-CM

## 2022-11-29 DIAGNOSIS — R0602 Shortness of breath: Secondary | ICD-10-CM | POA: Diagnosis not present

## 2022-11-29 DIAGNOSIS — E785 Hyperlipidemia, unspecified: Secondary | ICD-10-CM | POA: Diagnosis not present

## 2022-11-29 DIAGNOSIS — G4733 Obstructive sleep apnea (adult) (pediatric): Secondary | ICD-10-CM

## 2022-11-29 NOTE — Patient Instructions (Signed)
Medication Instructions:  No Changes *If you need a refill on your cardiac medications before your next appointment, please call your pharmacy*   Lab Work: CMET, Lipid Panel .  Today If you have labs (blood work) drawn today and your tests are completely normal, you will receive your results only by: MyChart Message (if you have MyChart) OR A paper copy in the mail If you have any lab test that is abnormal or we need to change your treatment, we will call you to review the results.   Testing/Procedures: [facility]  Pulmonary Care  [address]. Your physician has recommended that you have a pulmonary function test. Pulmonary Function Tests are a group of tests that measure how well air moves in and out of your lungs.    Follow-Up: At [facility], you and your health needs are our priority.  As part of our continuing mission to provide you with exceptional heart care, we have created designated Provider Care Teams.  These Care Teams include your primary Cardiologist (physician) and Advanced Practice Providers (APPs -  Physician Assistants and Nurse Practitioners) who all work together to provide you with the care you need, when you need it.  We recommend signing up for the patient portal called "MyChart".  Sign up information is provided on this After Visit Summary.  MyChart is used to connect with patients for Virtual Visits (Telemedicine).  Patients are able to view lab/test results, encounter notes, upcoming appointments, etc.  Non-urgent messages can be sent to your provider as well.   To learn more about what you can do with MyChart, go to [email or web address].    Your next appointment:   6 month(s)  Provider:   Rollene Rotunda, MD

## 2022-11-30 LAB — LIPID PANEL
Chol/HDL Ratio: 2.2 {ratio} (ref 0.0–4.4)
Cholesterol, Total: 153 mg/dL (ref 100–199)
HDL: 71 mg/dL (ref >39)
LDL Chol Calc (NIH): 59 mg/dL (ref 0–99)
Triglycerides: 137 mg/dL (ref 0–149)
VLDL Cholesterol Cal: 23 mg/dL (ref 5–40)

## 2022-11-30 LAB — COMPREHENSIVE METABOLIC PANEL
ALT: 14 [IU]/L (ref 0–32)
AST: 15 [IU]/L (ref 0–40)
Albumin: 4.4 g/dL (ref 3.8–4.8)
Alkaline Phosphatase: 100 [IU]/L (ref 44–121)
BUN/Creatinine Ratio: 17 (ref 12–28)
BUN: 31 mg/dL — ABNORMAL HIGH (ref 8–27)
Bilirubin Total: 0.3 mg/dL (ref 0.0–1.2)
CO2: 24 mmol/L (ref 20–29)
Calcium: 9.5 mg/dL (ref 8.7–10.3)
Chloride: 105 mmol/L (ref 96–106)
Creatinine, Ser: 1.78 mg/dL — ABNORMAL HIGH (ref 0.57–1.00)
Globulin, Total: 2.3 g/dL (ref 1.5–4.5)
Glucose: 94 mg/dL (ref 70–99)
Potassium: 4.8 mmol/L (ref 3.5–5.2)
Sodium: 141 mmol/L (ref 134–144)
Total Protein: 6.7 g/dL (ref 6.0–8.5)
eGFR: 30 mL/min/{1.73_m2} — ABNORMAL LOW (ref >59)

## 2022-12-02 ENCOUNTER — Encounter: Payer: Self-pay | Admitting: Family Medicine

## 2022-12-02 ENCOUNTER — Ambulatory Visit (INDEPENDENT_AMBULATORY_CARE_PROVIDER_SITE_OTHER): Payer: Medicare Other | Admitting: Family Medicine

## 2022-12-02 ENCOUNTER — Telehealth: Payer: Self-pay

## 2022-12-02 VITALS — BP 110/70 | HR 78 | Temp 97.9°F | Resp 18 | Ht 66.0 in | Wt 186.8 lb

## 2022-12-02 DIAGNOSIS — E785 Hyperlipidemia, unspecified: Secondary | ICD-10-CM | POA: Diagnosis not present

## 2022-12-02 DIAGNOSIS — E538 Deficiency of other specified B group vitamins: Secondary | ICD-10-CM

## 2022-12-02 DIAGNOSIS — I10 Essential (primary) hypertension: Secondary | ICD-10-CM

## 2022-12-02 DIAGNOSIS — N184 Chronic kidney disease, stage 4 (severe): Secondary | ICD-10-CM | POA: Diagnosis not present

## 2022-12-02 DIAGNOSIS — Z23 Encounter for immunization: Secondary | ICD-10-CM | POA: Diagnosis not present

## 2022-12-02 NOTE — Assessment & Plan Note (Signed)
Well controlled, no changes to meds. Encouraged heart healthy diet such as the DASH diet and exercise as tolerated.  °

## 2022-12-02 NOTE — Assessment & Plan Note (Signed)
Encourage heart healthy diet such as MIND or DASH diet, increase exercise, avoid trans fats, simple carbohydrates and processed foods, consider a krill or fish or flaxseed oil cap daily.   On repatha  

## 2022-12-02 NOTE — Assessment & Plan Note (Signed)
Check labs today.

## 2022-12-02 NOTE — Patient Instructions (Signed)

## 2022-12-02 NOTE — Assessment & Plan Note (Signed)
Check labs 

## 2022-12-02 NOTE — Progress Notes (Signed)
Established Patient Office Visit  Subjective   Patient ID: Kelsey Church, female    DOB: 1950-07-25  Age: 72 y.o. MRN: [id number]  Chief Complaint  Patient presents with   Hyperlipidemia   Follow-up    HPI Discussed the use of AI scribe software for clinical note transcription with the patient, who gave verbal consent to proceed.  History of Present Illness   The patient presents for a routine follow-up. She recently had a basal cell carcinoma removed, marking the third time in as many years that she has had a similar procedure. The previous two instances were almost basal cell, but not quite. The patient also mentions that she has been seeing a cardiologist and a nephrologist. She admits to probably not drinking enough water, which could be affecting her kidney function.  The patient's cholesterol levels are excellent, thanks to her medication. She also mentions that she has been granted a subsidy for her medication, as she couldn't afford the $400 cost. The patient expresses a wish that her medication could help her lose weight, like Ozempic does for others.  Finally, the patient mentions that she is going on a mission trip to [place] in December. She is unsure if she needs any additional vaccines for the trip.      Patient Active Problem List   Diagnosis Date Noted   Need for influenza vaccination 12/02/2022   Chronic kidney disease, stage 4 (severe) (HCC) 06/02/2022   COVID-19 07/27/2021   Hallucinations 05/06/2021   Viral URI 01/20/2021   Dysphagia    Hyperlipidemia LDL goal <70 12/07/2020   Pellagra-like syndrome (HCC) 08/27/2020   Atherosclerotic heart disease of native coronary artery with other forms of angina pectoris (HCC)    Symptomatic PVCs 12/26/2018   Degenerative lumbar disc 11/01/2016   Gastroesophageal reflux disease with esophagitis 12/10/2014   Moderate osteopenia 12/10/2014   Vitamin B12 deficiency 12/10/2014   OA (osteoarthritis) of knee 12/16/2013    Prediabetes 12/03/2013   Other screening mammogram 09/25/2012   DJD (degenerative joint disease) of knee 09/09/2011   OSA (obstructive sleep apnea) 07/29/2011   Essential hypertension, benign 07/29/2011   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/10/13   Cancer (HCC)    melanoma in 2 sights   Complication of anesthesia    "chills every evening at sundown x 2 weeks"- no fever   Depression    Dysphagia    History of kidney cancer    left   History of shingles    NO RESIDUAL PROBLEMS   Hypertension    Pain    LOWER BACK PAIN   Sleep apnea    CPAP   Past Surgical History:  Procedure Laterality Date   APPENDECTOMY  1958   COLONOSCOPY     COLONOSCOPY W/ POLYPECTOMY     7 polyps   ESOPHAGEAL MANOMETRY N/A 03/17/2021   Procedure: ESOPHAGEAL MANOMETRY (EM);  Surgeon: Beverley Fiedler, MD;  Location: [facility];  Service: Gastroenterology;  Laterality: N/A;   FOOT SURGERY Right    removal plantar wart   GALLBLADDER SURGERY  02/14/1990   INJECTION KNEE  09/2011   right    KNEE ARTHROSCOPY  1996   LEFT HEART CATH AND CORONARY ANGIOGRAPHY N/A 07/11/2019   Procedure: LEFT HEART CATH AND CORONARY ANGIOGRAPHY;  Surgeon: Lyn Records, MD;  Location: [facility];  Service: Cardiovascular;  Laterality: N/A;  MELANOMA EXCISION  1992   right chin, back   MOHS SURGERY     nose   NEPHRECTOMY Left 2022   POLYPECTOMY     SALPINGOOPHORECTOMY  2009   Right   TOTAL KNEE ARTHROPLASTY Right 06/10/2013   Procedure: RIGHT TOTAL KNEE ARTHROPLASTY;  Surgeon: Loanne Drilling, MD;  Location: [facility];  Service: Orthopedics;  Laterality: Right;   TOTAL KNEE ARTHROPLASTY Left 12/16/2013   Procedure: LEFT TOTAL KNEE ARTHROPLASTY;  Surgeon: Loanne Drilling, MD;  Location: [facility];  Service: Orthopedics;  Laterality: Left;   Social History   Tobacco Use   Smoking status: Never    Passive exposure: Never   Smokeless  tobacco: Never  Vaping Use   Vaping status: Never Used  Substance Use Topics   Alcohol use: No    Alcohol/week: 0.0 standard drinks of alcohol   Drug use: No   Social History   Socioeconomic History   Marital status: Widowed    Spouse name: Not on file   Number of children: 1   Years of education: Not on file   Highest education level: Associate degree: academic program  Occupational History   Occupation: CMA    Employer: [employer]  Tobacco Use   Smoking status: Never    Passive exposure: Never   Smokeless tobacco: Never  Vaping Use   Vaping status: Never Used  Substance and Sexual Activity   Alcohol use: No    Alcohol/week: 0.0 standard drinks of alcohol   Drug use: No   Sexual activity: Not Currently    Partners: Male    Birth control/protection: Post-menopausal    Comment: 1st intercourse- 18, partners- 1,   Other Topics Concern   Not on file  Social History Narrative   Lives alone, with dog.     Widow.  One son.    Caffienated drinks-no   Seat belt use often-yes   Regular Exercise-yes   Smoke alarm in the home-yes   Firearms/guns in the home-no   History of physical abuse-no            Social Determinants of Health   Financial Resource Strain: Medium Risk (11/28/2022)   Overall Financial Resource Strain (CARDIA)    Difficulty of Paying Living Expenses: Somewhat hard  Food Insecurity: Food Insecurity Present (11/28/2022)   Hunger Vital Sign    Worried About Running Out of Food in the Last Year: Sometimes true    Ran Out of Food in the Last Year: Never true  Transportation Needs: No Transportation Needs (11/28/2022)   PRAPARE - Administrator, Civil Service (Medical): No    Lack of Transportation (Non-Medical): No  Physical Activity: Sufficiently Active (11/28/2022)   Exercise Vital Sign    Days of Exercise per Week: 7 days    Minutes of Exercise per Session: 50 min  Stress: No Stress Concern Present (11/28/2022)   [employer] of  Occupational Health - Occupational Stress Questionnaire    Feeling of Stress : Only a little  Social Connections: Moderately Integrated (11/28/2022)   Social Connection and Isolation Panel [NHANES]    Frequency of Communication with Friends and Family: More than three times a week    Frequency of Social Gatherings with Friends and Family: More than three times a week    Attends Religious Services: More than 4 times per year    Active Member of [employer] or Organizations: Yes    Attends Banker Meetings: More than 4 times per  year    Marital Status: Widowed  Intimate Partner Violence: Not At Risk (06/03/2022)   Humiliation, Afraid, Rape, and Kick questionnaire    Fear of Current or Ex-Partner: No    Emotionally Abused: No    Physically Abused: No    Sexually Abused: No   Family Status  Relation Name Status   Mother  Deceased   Father  Deceased   Sister  (Not Specified)   MGM  (Not Specified)  No partnership data on file   Family History  Problem Relation Age of Onset   Osteoporosis Mother    Breast cancer Mother    Heart disease Father    Heart attack Father 60       37   Hypertension Father    Stroke Sister    Hypertension Sister    Diabetes Sister    Colon polyps Sister    Cancer Maternal Grandmother        Breast and Uterine Cancer   Breast cancer Maternal Grandmother    Thyroid cancer Maternal Grandmother    Allergies  Allergen Reactions   Atorvastatin    Rosuvastatin     40mg --Creatinine elevation      Review of Systems  Constitutional:  Negative for chills, fever and malaise/fatigue.  HENT:  Negative for congestion and hearing loss.   Eyes:  Negative for blurred vision and discharge.  Respiratory:  Negative for cough, sputum production and shortness of breath.   Cardiovascular:  Negative for chest pain, palpitations and leg swelling.  Gastrointestinal:  Negative for abdominal pain, blood in stool, constipation, diarrhea, heartburn, nausea and  vomiting.  Genitourinary:  Negative for dysuria, frequency, hematuria and urgency.  Musculoskeletal:  Negative for back pain, falls and myalgias.  Skin:  Negative for rash.  Neurological:  Negative for dizziness, sensory change, loss of consciousness, weakness and headaches.  Endo/Heme/Allergies:  Negative for environmental allergies. Does not bruise/bleed easily.  Psychiatric/Behavioral:  Negative for depression and suicidal ideas. The patient is not nervous/anxious and does not have insomnia.       Objective:     BP 110/70 (BP Location: Right Arm, Patient Position: Sitting, Cuff Size: Large)   Pulse 78   Temp 97.9 F (36.6 C) (Oral)   Resp 18   Ht 5\' 6"  (1.676 m)   Wt 186 lb 12.8 oz (84.7 kg)   SpO2 96%   BMI 30.15 kg/m  BP Readings from Last 3 Encounters:  12/02/22 110/70  11/29/22 126/72  06/08/22 (!) 102/56   Wt Readings from Last 3 Encounters:  12/02/22 186 lb 12.8 oz (84.7 kg)  11/29/22 182 lb (82.6 kg)  06/08/22 187 lb (84.8 kg)   SpO2 Readings from Last 3 Encounters:  12/02/22 96%  11/29/22 96%  06/02/22 98%      Physical Exam Vitals and nursing note reviewed.  Constitutional:      General: She is not in acute distress.    Appearance: Normal appearance. She is well-developed.  HENT:     Head: Normocephalic and atraumatic.     Right Ear: Tympanic membrane, ear canal and external ear normal. There is no impacted cerumen.     Left Ear: Tympanic membrane, ear canal and external ear normal. There is no impacted cerumen.     Nose: Nose normal.     Mouth/Throat:     Mouth: Mucous membranes are moist.     Pharynx: Oropharynx is clear. No oropharyngeal exudate or posterior oropharyngeal erythema.  Eyes:     General:  No scleral icterus.       Right eye: No discharge.        Left eye: No discharge.     Conjunctiva/sclera: Conjunctivae normal.     Pupils: Pupils are equal, round, and reactive to light.  Neck:     Thyroid: No thyromegaly or thyroid tenderness.      Vascular: No JVD.  Cardiovascular:     Rate and Rhythm: Normal rate and regular rhythm.     Heart sounds: Normal heart sounds. No murmur heard. Pulmonary:     Effort: Pulmonary effort is normal. No respiratory distress.     Breath sounds: Normal breath sounds.  Abdominal:     General: Bowel sounds are normal. There is no distension.     Palpations: Abdomen is soft. There is no mass.     Tenderness: There is no abdominal tenderness. There is no guarding or rebound.  Genitourinary:    Vagina: Normal.  Musculoskeletal:        General: Normal range of motion.     Cervical back: Normal range of motion and neck supple.     Right lower leg: No edema.     Left lower leg: No edema.  Lymphadenopathy:     Cervical: No cervical adenopathy.  Skin:    General: Skin is warm and dry.     Findings: No erythema or rash.  Neurological:     Mental Status: She is alert and oriented to person, place, and time.     Cranial Nerves: No cranial nerve deficit.     Deep Tendon Reflexes: Reflexes are normal and symmetric.  Psychiatric:        Mood and Affect: Mood normal.        Behavior: Behavior normal.        Thought Content: Thought content normal.        Judgment: Judgment normal.      No results found for any visits on 12/02/22.  Last CBC Lab Results  Component Value Date   WBC 5.8 04/28/2022   HGB 12.5 04/28/2022   HCT 39.1 04/28/2022   MCV 84 04/28/2022   MCH 26.8 04/28/2022   RDW 14.4 04/28/2022   PLT 276 04/28/2022   Last metabolic panel Lab Results  Component Value Date   GLUCOSE 94 11/29/2022   NA 141 11/29/2022   K 4.8 11/29/2022   CL 105 11/29/2022   CO2 24 11/29/2022   BUN 31 (H) 11/29/2022   CREATININE 1.78 (H) 11/29/2022   EGFR 30 (L) 11/29/2022   CALCIUM 9.5 11/29/2022   PHOS 4.0 08/05/2020   PROT 6.7 11/29/2022   ALBUMIN 4.4 11/29/2022   LABGLOB 2.3 11/29/2022   BILITOT 0.3 11/29/2022   ALKPHOS 100 11/29/2022   AST 15 11/29/2022   ALT 14 11/29/2022    ANIONGAP 11 12/27/2013   Last lipids Lab Results  Component Value Date   CHOL 153 11/29/2022   HDL 71 11/29/2022   LDLCALC 59 11/29/2022   TRIG 137 11/29/2022   CHOLHDL 2.2 11/29/2022   Last hemoglobin A1c Lab Results  Component Value Date   HGBA1C 5.9 08/27/2020   Last thyroid functions Lab Results  Component Value Date   TSH 1.40 06/08/2022   Last vitamin D Lab Results  Component Value Date   VD25OH 29 (L) 02/12/2019   Last vitamin B12 and Folate Lab Results  Component Value Date   VITAMINB12 728 08/27/2020   FOLATE 13.8 08/27/2020      The 10-year ASCVD  risk score (Arnett DK, et al., 2019) is: 10.7%    Assessment & Plan:   Problem List Items Addressed This Visit       Unprioritized   Vitamin B12 deficiency    Check labs       Need for influenza vaccination - Primary   Relevant Orders   Flu Vaccine Trivalent High Dose (Fluad) (Completed)   Hyperlipidemia LDL goal <70    Encourage heart healthy diet such as MIND or DASH diet, increase exercise, avoid trans fats, simple carbohydrates and processed foods, consider a krill or fish or flaxseed oil cap daily.   On repatha      Essential hypertension, benign    Well controlled, no changes to meds. Encouraged heart healthy diet such as the DASH diet and exercise as tolerated.        Chronic kidney disease, stage 4 (severe) (HCC)    Check labs today     Assessment and Plan    Basal Cell Carcinoma Recent removal of basal cell carcinoma. No current issues reported. -Continue annual dermatology visits.  Hyperlipidemia Cholesterol levels well controlled on Repatha. -Continue Repatha as prescribed.  Chronic Kidney Disease Stable kidney function. Patient may not be adequately hydrated. -Increase water intake to potentially improve kidney function.  General Health Maintenance -Administer influenza vaccine today. -Consider Shingles vaccine at pharmacy at patient's convenience. -Check CDC's website for  travel vaccines prior to mission trip in December. -Schedule bone density test in 2025.        No follow-ups on file.    Donato Schultz, DO

## 2022-12-02 NOTE — Telephone Encounter (Addendum)
Called patient regarding results. Patient had understanding of results.----- Message from Joni Reining sent at 12/01/2022  7:20 AM EDT ----- I have reviewed the labs.  Improvement in kidney function from last blood draw. This is good news. Cholesterol is well controlled. No changes in the regimen.

## 2022-12-14 ENCOUNTER — Other Ambulatory Visit: Payer: Medicare Other

## 2022-12-19 ENCOUNTER — Ambulatory Visit
Admission: RE | Admit: 2022-12-19 | Discharge: 2022-12-19 | Disposition: A | Discharge status: Home / Self Care | Payer: Medicare Other | Source: Ambulatory Visit | Attending: Specialist | Admitting: Specialist

## 2022-12-19 DIAGNOSIS — I10 Essential (primary) hypertension: Secondary | ICD-10-CM | POA: Diagnosis not present

## 2022-12-19 DIAGNOSIS — Z85528 Personal history of other malignant neoplasm of kidney: Secondary | ICD-10-CM | POA: Diagnosis not present

## 2022-12-19 DIAGNOSIS — N184 Chronic kidney disease, stage 4 (severe): Secondary | ICD-10-CM | POA: Diagnosis not present

## 2022-12-19 DIAGNOSIS — K219 Gastro-esophageal reflux disease without esophagitis: Secondary | ICD-10-CM | POA: Diagnosis not present

## 2022-12-19 MED ORDER — GADOPICLENOL 0.5 MMOL/ML IV SOLN
9.0000 mL | Freq: Once | INTRAVENOUS | Status: AC | PRN
  Administered 2022-12-19: 9 mL via INTRAVENOUS

## 2022-12-21 DIAGNOSIS — N184 Chronic kidney disease, stage 4 (severe): Secondary | ICD-10-CM | POA: Diagnosis not present

## 2022-12-26 ENCOUNTER — Ambulatory Visit (INDEPENDENT_AMBULATORY_CARE_PROVIDER_SITE_OTHER): Payer: Medicare Other | Admitting: Family Medicine

## 2022-12-26 ENCOUNTER — Encounter: Payer: Self-pay | Admitting: Family Medicine

## 2022-12-26 VITALS — BP 112/78 | HR 62 | Temp 97.8°F | Resp 18 | Ht 66.0 in | Wt 181.2 lb

## 2022-12-26 DIAGNOSIS — R21 Rash and other nonspecific skin eruption: Secondary | ICD-10-CM

## 2022-12-26 MED ORDER — PREDNISONE 10 MG PO TABS
ORAL_TABLET | ORAL | 0 refills | Status: DC
Start: 2022-12-26 — End: 2023-02-02

## 2022-12-26 MED ORDER — METHYLPREDNISOLONE ACETATE 80 MG/ML IJ SUSP
80.0000 mg | Freq: Once | INTRAMUSCULAR | Status: AC
Start: 2022-12-26 — End: 2022-12-26
  Administered 2022-12-26: 80 mg via INTRAMUSCULAR

## 2022-12-26 NOTE — Progress Notes (Signed)
Established Patient Office Visit  Subjective   Patient ID: Kelsey Church, female    DOB: 05/26/1950  Age: 72 y.o. MRN: [id number]  Chief Complaint  Patient presents with   Rash    X1 week, lower back to upper back, pt reports itching.     HPI Discussed the use of AI scribe software for clinical note transcription with the patient, who gave verbal consent to proceed.  History of Present Illness   The patient presents with a persistent, itchy rash of approximately two weeks duration. The rash, initially localized, has spread up the back and is associated with intermittent itching. Despite attempts at self-management with zinc oxide and cortisone, the rash has not improved and becomes more bothersome with physical activity.  The patient has a history of allergies to various substances, including chlorine and certain skincare products, and has previously experienced a similar rash. However, she denies any recent exposure to known allergens or introduction of new substances. The patient has been using the same detergent and underwear, although she did switch back to Tide recently. She also mentioned using a body wash from [employer] containing epsom salt and tea tree oil for several months, which she suspects might be the cause of the current rash.  The patient has been taking Benadryl for symptomatic relief. She also mentioned a possible error in her Repatha dosing schedule and a recent MRI with and without contrast. However, she was fully clothed during the MRI and has undergone similar procedures every six months without any adverse reactions. The patient also reported doing some yard work two weekends ago, but she showered immediately afterwards.  The patient's medical history includes kidney disease, for which she is under the care of a nephrologist. She has been managing her allergies with Vanicream products, which she orders from [employer]. The patient denies any new food introductions or  changes in her diet.      Patient Active Problem List   Diagnosis Date Noted   Need for influenza vaccination 12/02/2022   Chronic kidney disease, stage 4 (severe) (HCC) 06/02/2022   COVID-19 07/27/2021   Hallucinations 05/06/2021   Viral URI 01/20/2021   Dysphagia    Hyperlipidemia LDL goal <70 12/07/2020   Pellagra-like syndrome (HCC) 08/27/2020   Atherosclerotic heart disease of native coronary artery with other forms of angina pectoris (HCC)    Symptomatic PVCs 12/26/2018   Degenerative lumbar disc 11/01/2016   Gastroesophageal reflux disease with esophagitis 12/10/2014   Moderate osteopenia 12/10/2014   Vitamin B12 deficiency 12/10/2014   OA (osteoarthritis) of knee 12/16/2013   Prediabetes 12/03/2013   Other screening mammogram 09/25/2012   DJD (degenerative joint disease) of knee 09/09/2011   OSA (obstructive sleep apnea) 07/29/2011   Essential hypertension, benign 07/29/2011   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/10/13   Cancer (HCC)    melanoma in 2 sights   Complication of anesthesia    "chills every evening at sundown x 2 weeks"- no fever   Depression    Dysphagia    History of kidney cancer    left   History of shingles    NO RESIDUAL PROBLEMS   Hypertension    Pain    LOWER BACK PAIN   Sleep apnea    CPAP   Past Surgical History:  Procedure Laterality Date   APPENDECTOMY  1958   COLONOSCOPY  COLONOSCOPY W/ POLYPECTOMY     7 polyps   ESOPHAGEAL MANOMETRY N/A 03/17/2021   Procedure: ESOPHAGEAL MANOMETRY (EM);  Surgeon: Beverley Fiedler, MD;  Location: [facility];  Service: Gastroenterology;  Laterality: N/A;   FOOT SURGERY Right    removal plantar wart   GALLBLADDER SURGERY  02/14/1990   INJECTION KNEE  09/2011   right    KNEE ARTHROSCOPY  1996   LEFT HEART CATH AND CORONARY ANGIOGRAPHY N/A 07/11/2019   Procedure: LEFT HEART CATH AND CORONARY ANGIOGRAPHY;  Surgeon:  Lyn Records, MD;  Location: [facility];  Service: Cardiovascular;  Laterality: N/A;   MELANOMA EXCISION  1992   right chin, back   MOHS SURGERY     nose   NEPHRECTOMY Left 2022   POLYPECTOMY     SALPINGOOPHORECTOMY  2009   Right   TOTAL KNEE ARTHROPLASTY Right 06/10/2013   Procedure: RIGHT TOTAL KNEE ARTHROPLASTY;  Surgeon: Loanne Drilling, MD;  Location: [facility];  Service: Orthopedics;  Laterality: Right;   TOTAL KNEE ARTHROPLASTY Left 12/16/2013   Procedure: LEFT TOTAL KNEE ARTHROPLASTY;  Surgeon: Loanne Drilling, MD;  Location: [facility];  Service: Orthopedics;  Laterality: Left;   Social History   Tobacco Use   Smoking status: Never    Passive exposure: Never   Smokeless tobacco: Never  Vaping Use   Vaping status: Never Used  Substance Use Topics   Alcohol use: No    Alcohol/week: 0.0 standard drinks of alcohol   Drug use: No   Social History   Socioeconomic History   Marital status: Widowed    Spouse name: Not on file   Number of children: 1   Years of education: Not on file   Highest education level: Associate degree: academic program  Occupational History   Occupation: CMA    Employer: [employer]  Tobacco Use   Smoking status: Never    Passive exposure: Never   Smokeless tobacco: Never  Vaping Use   Vaping status: Never Used  Substance and Sexual Activity   Alcohol use: No    Alcohol/week: 0.0 standard drinks of alcohol   Drug use: No   Sexual activity: Not Currently    Partners: Male    Birth control/protection: Post-menopausal    Comment: 1st intercourse- 18, partners- 1,   Other Topics Concern   Not on file  Social History Narrative   Lives alone, with dog.     Widow.  One son.    Caffienated drinks-no   Seat belt use often-yes   Regular Exercise-yes   Smoke alarm in the home-yes   Firearms/guns in the home-no   History of physical abuse-no            Social Determinants of Health   Financial Resource Strain: Medium Risk  (11/28/2022)   Overall Financial Resource Strain (CARDIA)    Difficulty of Paying Living Expenses: Somewhat hard  Food Insecurity: Food Insecurity Present (11/28/2022)   Hunger Vital Sign    Worried About Running Out of Food in the Last Year: Sometimes true    Ran Out of Food in the Last Year: Never true  Transportation Needs: No Transportation Needs (11/28/2022)   PRAPARE - Administrator, Civil Service (Medical): No    Lack of Transportation (Non-Medical): No  Physical Activity: Sufficiently Active (11/28/2022)   Exercise Vital Sign    Days of Exercise per Week: 7 days    Minutes of Exercise per Session: 50 min  Stress: No Stress Concern Present (11/28/2022)   [employer] of Occupational Health - Occupational Stress Questionnaire    Feeling of Stress : Only a little  Social Connections: Moderately Integrated (11/28/2022)   Social Connection and Isolation Panel [NHANES]    Frequency of Communication with Friends and Family: More than three times a week    Frequency of Social Gatherings with Friends and Family: More than three times a week    Attends Religious Services: More than 4 times per year    Active Member of [employer] or Organizations: Yes    Attends Banker Meetings: More than 4 times per year    Marital Status: Widowed  Intimate Partner Violence: Not At Risk (06/03/2022)   Humiliation, Afraid, Rape, and Kick questionnaire    Fear of Current or Ex-Partner: No    Emotionally Abused: No    Physically Abused: No    Sexually Abused: No   Family Status  Relation Name Status   Mother  Deceased   Father  Deceased   Sister  (Not Specified)   MGM  (Not Specified)  No partnership data on file   Family History  Problem Relation Age of Onset   Osteoporosis Mother    Breast cancer Mother    Heart disease Father    Heart attack Father 88       63   Hypertension Father    Stroke Sister    Hypertension Sister    Diabetes Sister    Colon polyps  Sister    Cancer Maternal Grandmother        Breast and Uterine Cancer   Breast cancer Maternal Grandmother    Thyroid cancer Maternal Grandmother    Allergies  Allergen Reactions   Atorvastatin    Rosuvastatin     40mg --Creatinine elevation      Review of Systems  Constitutional:  Negative for chills, fever and malaise/fatigue.  HENT:  Negative for congestion and hearing loss.   Eyes:  Negative for blurred vision and discharge.  Respiratory:  Negative for cough, sputum production and shortness of breath.   Cardiovascular:  Negative for chest pain, palpitations and leg swelling.  Gastrointestinal:  Negative for abdominal pain, blood in stool, constipation, diarrhea, heartburn, nausea and vomiting.  Genitourinary:  Negative for dysuria, frequency, hematuria and urgency.  Musculoskeletal:  Negative for back pain, falls and myalgias.  Skin:  Positive for itching and rash.  Neurological:  Negative for dizziness, sensory change, loss of consciousness, weakness and headaches.  Endo/Heme/Allergies:  Negative for environmental allergies. Does not bruise/bleed easily.  Psychiatric/Behavioral:  Negative for depression and suicidal ideas. The patient is not nervous/anxious and does not have insomnia.       Objective:     BP 112/78 (BP Location: Right Arm, Patient Position: Sitting, Cuff Size: Large)   Pulse 62   Temp 97.8 F (36.6 C) (Oral)   Resp 18   Ht 5\' 6"  (1.676 m)   Wt 181 lb 3.2 oz (82.2 kg)   SpO2 97%   BMI 29.25 kg/m  BP Readings from Last 3 Encounters:  12/26/22 112/78  12/02/22 110/70  11/29/22 126/72   Wt Readings from Last 3 Encounters:  12/26/22 181 lb 3.2 oz (82.2 kg)  12/02/22 186 lb 12.8 oz (84.7 kg)  11/29/22 182 lb (82.6 kg)   SpO2 Readings from Last 3 Encounters:  12/26/22 97%  12/02/22 96%  11/29/22 96%      Physical Exam Vitals and nursing note reviewed.  Constitutional:  General: She is not in acute distress.    Appearance: Normal  appearance. She is well-developed.  HENT:     Head: Normocephalic and atraumatic.  Eyes:     General: No scleral icterus.       Right eye: No discharge.        Left eye: No discharge.  Cardiovascular:     Rate and Rhythm: Normal rate and regular rhythm.     Heart sounds: No murmur heard. Pulmonary:     Effort: Pulmonary effort is normal. No respiratory distress.     Breath sounds: Normal breath sounds.  Musculoskeletal:        General: Normal range of motion.     Cervical back: Normal range of motion and neck supple.     Right lower leg: No edema.     Left lower leg: No edema.  Skin:    General: Skin is warm and dry.     Findings: Rash present. Rash is macular and papular.       Neurological:     Mental Status: She is alert and oriented to person, place, and time.  Psychiatric:        Mood and Affect: Mood normal.        Behavior: Behavior normal.        Thought Content: Thought content normal.        Judgment: Judgment normal.     No results found for any visits on 12/26/22.  Last CBC Lab Results  Component Value Date   WBC 5.8 04/28/2022   HGB 12.5 04/28/2022   HCT 39.1 04/28/2022   MCV 84 04/28/2022   MCH 26.8 04/28/2022   RDW 14.4 04/28/2022   PLT 276 04/28/2022   Last metabolic panel Lab Results  Component Value Date   GLUCOSE 94 11/29/2022   NA 141 11/29/2022   K 4.8 11/29/2022   CL 105 11/29/2022   CO2 24 11/29/2022   BUN 31 (H) 11/29/2022   CREATININE 1.78 (H) 11/29/2022   EGFR 30 (L) 11/29/2022   CALCIUM 9.5 11/29/2022   PHOS 4.0 08/05/2020   PROT 6.7 11/29/2022   ALBUMIN 4.4 11/29/2022   LABGLOB 2.3 11/29/2022   BILITOT 0.3 11/29/2022   ALKPHOS 100 11/29/2022   AST 15 11/29/2022   ALT 14 11/29/2022   ANIONGAP 11 12/27/2013   Last lipids Lab Results  Component Value Date   CHOL 153 11/29/2022   HDL 71 11/29/2022   LDLCALC 59 11/29/2022   TRIG 137 11/29/2022   CHOLHDL 2.2 11/29/2022   Last hemoglobin A1c Lab Results  Component  Value Date   HGBA1C 5.9 08/27/2020   Last thyroid functions Lab Results  Component Value Date   TSH 1.40 06/08/2022   Last vitamin D Lab Results  Component Value Date   VD25OH 29 (L) 02/12/2019   Last vitamin B12 and Folate Lab Results  Component Value Date   VITAMINB12 728 08/27/2020   FOLATE 13.8 08/27/2020      The 10-year ASCVD risk score (Arnett DK, et al., 2019) is: 11.1%    Assessment & Plan:   Problem List Items Addressed This Visit   None Visit Diagnoses     Rash    -  Primary   Relevant Medications   predniSONE (DELTASONE) 10 MG tablet   methylPREDNISolone acetate (DEPO-MEDROL) injection 80 mg (Completed)     Assessment and Plan    Allergic Contact Dermatitis   She has experienced a pruritic rash for two weeks on  her back, which has not responded to zinc oxide or cortisone. Potential triggers include Tide detergent, a new body wash with Epsom salt and tea tree oil, and cleaning chemicals used during an MRI. She has multiple confirmed allergies. The differential diagnosis includes irritant contact dermatitis and other allergic reactions. We discussed the risks of a prednisone taper, including renal implications. A skin biopsy may be necessary if there is no improvement. We will administer a Depo Medrol injection and prescribe a prednisone taper starting tomorrow. She should continue Benadryl at night for itching and sleep. We will consider a skin biopsy if there is no improvement and consult a nephrologist regarding prednisone use due to renal function concerns.  Chronic Kidney Disease   She has chronic kidney disease with one kidney not fully functional. Recent labs show well-managed creatinine and GFR levels. She is under the care of a nephrologist with an upcoming appointment. Prednisone use will be monitored closely due to renal function concerns. We will review recent lab results with the nephrologist and monitor renal function closely during prednisone  treatment.  General Health Maintenance   She received a flu shot at the last visit and no new health maintenance issues were discussed. We will continue routine health maintenance and follow-up as needed.  Follow-up   We will follow up with the nephrologist on Wednesday and she should report any lack of improvement in the rash by tomorrow.        No follow-ups on file.    Donato Schultz, DO

## 2022-12-28 ENCOUNTER — Encounter: Payer: Self-pay | Admitting: Cardiology

## 2022-12-28 DIAGNOSIS — Z905 Acquired absence of kidney: Secondary | ICD-10-CM | POA: Diagnosis not present

## 2022-12-28 DIAGNOSIS — I2510 Atherosclerotic heart disease of native coronary artery without angina pectoris: Secondary | ICD-10-CM | POA: Diagnosis not present

## 2022-12-28 DIAGNOSIS — N184 Chronic kidney disease, stage 4 (severe): Secondary | ICD-10-CM | POA: Diagnosis not present

## 2022-12-28 DIAGNOSIS — C642 Malignant neoplasm of left kidney, except renal pelvis: Secondary | ICD-10-CM | POA: Diagnosis not present

## 2022-12-28 DIAGNOSIS — D631 Anemia in chronic kidney disease: Secondary | ICD-10-CM | POA: Diagnosis not present

## 2022-12-28 MED ORDER — ISOSORBIDE MONONITRATE ER 60 MG PO TB24: 60.0000 mg | ORAL_TABLET | Freq: Every day | ORAL | 0 refills | Status: DC

## 2023-01-02 DIAGNOSIS — Z85528 Personal history of other malignant neoplasm of kidney: Secondary | ICD-10-CM | POA: Diagnosis not present

## 2023-01-16 ENCOUNTER — Ambulatory Visit (INDEPENDENT_AMBULATORY_CARE_PROVIDER_SITE_OTHER): Payer: Medicare Other | Admitting: Internal Medicine

## 2023-01-16 ENCOUNTER — Encounter: Payer: Self-pay | Admitting: Internal Medicine

## 2023-01-16 VITALS — BP 138/76 | HR 71 | Temp 97.6°F | Resp 16 | Ht 66.0 in | Wt 185.1 lb

## 2023-01-16 DIAGNOSIS — W540XXA Bitten by dog, initial encounter: Secondary | ICD-10-CM | POA: Diagnosis not present

## 2023-01-16 DIAGNOSIS — S31159A Open bite of abdominal wall, unspecified quadrant without penetration into peritoneal cavity, initial encounter: Secondary | ICD-10-CM

## 2023-01-16 DIAGNOSIS — Z23 Encounter for immunization: Secondary | ICD-10-CM | POA: Diagnosis not present

## 2023-01-16 MED ORDER — AMOXICILLIN-POT CLAVULANATE 875-125 MG PO TABS
1.0000 | ORAL_TABLET | Freq: Two times a day (BID) | ORAL | 0 refills | Status: DC
Start: 2023-01-16 — End: 2023-02-02

## 2023-01-16 NOTE — Progress Notes (Unsigned)
Subjective:    Patient ID: Kelsey Church, female    DOB: Jul 05, 1950, 72 y.o.   MRN: [id number]  DOS:  01/16/2023 Type of visit - description: Acute  2 days ago she went to pick up her own dog from friend's house. The house owner's dog, bite her on the abdomen. She bled, went home, cleaned it really well and put some mupirocin. Is here because she needs some reassurance that things are okay.  Denies any fever or chills. No discharge. Developed a bruise which yesterday was noticeable than it is today.  Review of Systems See above   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/10/13   Cancer (HCC)    melanoma in 2 sights   Complication of anesthesia    "chills every evening at sundown x 2 weeks"- no fever   Depression    Dysphagia    History of kidney cancer    left   History of shingles    NO RESIDUAL PROBLEMS   Hypertension    Pain    LOWER BACK PAIN   Sleep apnea    CPAP    Past Surgical History:  Procedure Laterality Date   APPENDECTOMY  1958   COLONOSCOPY     COLONOSCOPY W/ POLYPECTOMY     7 polyps   ESOPHAGEAL MANOMETRY N/A 03/17/2021   Procedure: ESOPHAGEAL MANOMETRY (EM);  Surgeon: Beverley Fiedler, MD;  Location: [facility];  Service: Gastroenterology;  Laterality: N/A;   FOOT SURGERY Right    removal plantar wart   GALLBLADDER SURGERY  02/14/1990   INJECTION KNEE  09/2011   right    KNEE ARTHROSCOPY  1996   LEFT HEART CATH AND CORONARY ANGIOGRAPHY N/A 07/11/2019   Procedure: LEFT HEART CATH AND CORONARY ANGIOGRAPHY;  Surgeon: Lyn Records, MD;  Location: [facility];  Service: Cardiovascular;  Laterality: N/A;   MELANOMA EXCISION  1992   right chin, back   MOHS SURGERY     nose   NEPHRECTOMY Left 2022   POLYPECTOMY     SALPINGOOPHORECTOMY  2009   Right   TOTAL KNEE ARTHROPLASTY Right 06/10/2013   Procedure: RIGHT TOTAL KNEE ARTHROPLASTY;  Surgeon: Loanne Drilling, MD;   Location: [facility];  Service: Orthopedics;  Laterality: Right;   TOTAL KNEE ARTHROPLASTY Left 12/16/2013   Procedure: LEFT TOTAL KNEE ARTHROPLASTY;  Surgeon: Loanne Drilling, MD;  Location: [facility];  Service: Orthopedics;  Laterality: Left;    Current Outpatient Medications  Medication Instructions   acetaminophen (TYLENOL) 1,300 mg, Oral, Every 8 hours PRN   aspirin EC 81 mg, Oral, Daily   cholecalciferol (VITAMIN D3) 1,000 Units, Oral, Daily   citalopram (CELEXA) 20 MG tablet 1 1/2 tab po qd   docusate sodium (COLACE) 100 MG capsule Oral, 1 capsule daily sometimes twice a day   Evolocumab (REPATHA SURECLICK) 140 MG/ML SOAJ INJECT INTO THE SKIN EVERY 14 DAYS   isosorbide mononitrate (IMDUR) 60 mg, Oral, Daily   metoprolol succinate (TOPROL-XL) 25 MG 24 hr tablet TAKE 2 TABLETS IN THE MORNING AND 1 TABLET EVERY  EVENING   nitroGLYCERIN (NITROSTAT) 0.4 mg, Sublingual, Every 5 min PRN   pantoprazole (PROTONIX) 40 MG tablet TAKE 1 TABLET (40 MG TOTAL) BY MOUTH TWICE A DAY BEFORE MEALS   predniSONE (DELTASONE) 10 MG tablet TAKE 3 TABLETS PO QD FOR 3 DAYS THEN TAKE 2 TABLETS PO QD  FOR 3 DAYS THEN TAKE 1 TABLET PO QD FOR 3 DAYS THEN TAKE 1/2 TAB PO QD FOR 3 DAYS   ranolazine (RANEXA) 500 mg, Oral, 2 times daily   vitamin B-12 (CYANOCOBALAMIN) 500 mcg, Oral, Daily       Objective:   Physical Exam Abdominal:       Comments: Lower abdomen was area of bruising and 2-3 scratches related to the dog biting.  There is no warmness, no pockets or abscesses that I can tell.  BP 138/76   Pulse 71   Temp 97.6 F (36.4 C) (Oral)   Resp 16   Ht 5\' 6"  (1.676 m)   Wt 185 lb 2 oz (84 kg)   SpO2 97%   BMI 29.88 kg/m  General:   Well developed, NAD, BMI noted. HEENT:  Normocephalic . Face symmetric, atraumatic   Skin: See graphic  neurologic:  alert & oriented X3.  Speech normal, gait appropriate for age and unassisted Psych--  Cognition and judgment appear intact.  Cooperative with normal  attention span and concentration.  Behavior appropriate. No anxious or depressed appearing.      Assessment    72 year old female, PMH includes CAD, hyperlipidemia, history of kidney cancer, presents with    Dog bite, initial encounter. Episode happened 2 days ago, on exam today there is no evidence of cellulitis. Patient been told that the attacking dog was up-to-date on all vaccines.   Plan: Td today Prophylactic Augmentin for 1 week. Check on the dog who  bite in the next couple of weeks to be sure the animal is doing okay. Watch for redness, warmness, swelling.  See AVS

## 2023-01-16 NOTE — Patient Instructions (Signed)
You are getting a tetanus shot today.  Take the antibiotic as prescribed for 1 week.  If the area get red, hot, very tender or swollen: Seek medical attention.

## 2023-01-17 ENCOUNTER — Ambulatory Visit: Payer: Medicare Other | Admitting: Family Medicine

## 2023-02-02 ENCOUNTER — Encounter: Payer: Self-pay | Admitting: Family Medicine

## 2023-02-02 ENCOUNTER — Ambulatory Visit (INDEPENDENT_AMBULATORY_CARE_PROVIDER_SITE_OTHER): Payer: Medicare Other | Admitting: Family Medicine

## 2023-02-02 VITALS — BP 118/60 | HR 84 | Temp 97.7°F | Resp 18 | Ht 66.0 in | Wt 181.0 lb

## 2023-02-02 DIAGNOSIS — R109 Unspecified abdominal pain: Secondary | ICD-10-CM

## 2023-02-02 DIAGNOSIS — S31159A Open bite of abdominal wall, unspecified quadrant without penetration into peritoneal cavity, initial encounter: Secondary | ICD-10-CM | POA: Diagnosis not present

## 2023-02-02 DIAGNOSIS — R232 Flushing: Secondary | ICD-10-CM | POA: Diagnosis not present

## 2023-02-02 DIAGNOSIS — W540XXA Bitten by dog, initial encounter: Secondary | ICD-10-CM

## 2023-02-02 DIAGNOSIS — R102 Pelvic and perineal pain: Secondary | ICD-10-CM | POA: Diagnosis not present

## 2023-02-02 NOTE — Assessment & Plan Note (Signed)
Healing well  + hematoma  Warm compresses  Return to office as needed

## 2023-02-02 NOTE — Assessment & Plan Note (Signed)
Check [place] abd/ pelvis F/u gyn

## 2023-02-02 NOTE — Progress Notes (Signed)
Established Patient Office Visit  Subjective   Patient ID: Kelsey Church, female    DOB: 14-Feb-1951  Age: 72 y.o. MRN: [id number]  Chief Complaint  Patient presents with   Animal Bite   Follow-up    HPI Discussed the use of AI scribe software for clinical note transcription with the patient, who gave verbal consent to proceed.  History of Present Illness   The patient, with a history of cardiac heart disease and a previous kidney tumor, presents with a recent dog bite injury. The incident occurred three weeks ago when the patient was bitten by a neighbor's dog. The bite resulted in a large hematoma on the patient's right side, which initially extended from the site of the bite down towards the hip. The patient sought immediate medical attention and was given a tetanus shot and a seven-day course of Augmentin. The hematoma has since decreased in size but remains hard and painful, particularly when leaning against a surface.  In addition to the dog bite injury, the patient has been experiencing severe hot flashes, which have only started within the past year. These episodes are particularly intense in the mornings and can be triggered by activities such as showering or moving around. The patient has a history of being on estrogen patches, which were discontinued around 2002 due to the diagnosis of cardiac heart disease. The patient also has a history of fibroids, as revealed by an ultrasound in 2020.  The patient also reports a recent mission trip to [place], during which she managed the dog bite injury. Upon return, the patient experienced symptoms suggestive of a head cold. The patient has been managing the hot flashes by turning off the heat, opening doors, and using a fan. The patient has one ovary and has reported feeling ovulation-like sensations.      Patient Active Problem List   Diagnosis Date Noted   Hot flashes 02/02/2023   Dog bite of abdomen 02/02/2023   Need for influenza  vaccination 12/02/2022   Chronic kidney disease, stage 4 (severe) (HCC) 06/02/2022   COVID-19 07/27/2021   Hallucinations 05/06/2021   Viral URI 01/20/2021   Dysphagia    Hyperlipidemia LDL goal <70 12/07/2020   Pellagra-like syndrome (HCC) 08/27/2020   Atherosclerotic heart disease of native coronary artery with other forms of angina pectoris (HCC)    Symptomatic PVCs 12/26/2018   Combined abdominal and pelvic pain 08/28/2017   Degenerative lumbar disc 11/01/2016   Gastroesophageal reflux disease with esophagitis 12/10/2014   Moderate osteopenia 12/10/2014   Vitamin B12 deficiency 12/10/2014   OA (osteoarthritis) of knee 12/16/2013   Prediabetes 12/03/2013   Other screening mammogram 09/25/2012   DJD (degenerative joint disease) of knee 09/09/2011   OSA (obstructive sleep apnea) 07/29/2011   Essential hypertension, benign 07/29/2011   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/10/13   Cancer (HCC)    melanoma in 2 sights   Complication of anesthesia    "chills every evening at sundown x 2 weeks"- no fever   Depression    Dysphagia    History of kidney cancer    left   History of shingles    NO RESIDUAL PROBLEMS   Hypertension    Pain    LOWER BACK PAIN   Sleep apnea    CPAP   Past Surgical History:  Procedure Laterality Date   APPENDECTOMY  1958   COLONOSCOPY  COLONOSCOPY W/ POLYPECTOMY     7 polyps   ESOPHAGEAL MANOMETRY N/A 03/17/2021   Procedure: ESOPHAGEAL MANOMETRY (EM);  Surgeon: Beverley Fiedler, MD;  Location: [facility];  Service: Gastroenterology;  Laterality: N/A;   FOOT SURGERY Right    removal plantar wart   GALLBLADDER SURGERY  02/14/1990   INJECTION KNEE  09/2011   right    KNEE ARTHROSCOPY  1996   LEFT HEART CATH AND CORONARY ANGIOGRAPHY N/A 07/11/2019   Procedure: LEFT HEART CATH AND CORONARY ANGIOGRAPHY;  Surgeon: Lyn Records, MD;  Location: [facility];   Service: Cardiovascular;  Laterality: N/A;   MELANOMA EXCISION  1992   right chin, back   MOHS SURGERY     nose   NEPHRECTOMY Left 2022   POLYPECTOMY     SALPINGOOPHORECTOMY  2009   Right   TOTAL KNEE ARTHROPLASTY Right 06/10/2013   Procedure: RIGHT TOTAL KNEE ARTHROPLASTY;  Surgeon: Loanne Drilling, MD;  Location: [facility];  Service: Orthopedics;  Laterality: Right;   TOTAL KNEE ARTHROPLASTY Left 12/16/2013   Procedure: LEFT TOTAL KNEE ARTHROPLASTY;  Surgeon: Loanne Drilling, MD;  Location: [facility];  Service: Orthopedics;  Laterality: Left;   Social History   Tobacco Use   Smoking status: Never    Passive exposure: Never   Smokeless tobacco: Never  Vaping Use   Vaping status: Never Used  Substance Use Topics   Alcohol use: No    Alcohol/week: 0.0 standard drinks of alcohol   Drug use: No   Social History   Socioeconomic History   Marital status: Widowed    Spouse name: Not on file   Number of children: 1   Years of education: Not on file   Highest education level: Associate degree: academic program  Occupational History   Occupation: CMA    Employer: [employer]  Tobacco Use   Smoking status: Never    Passive exposure: Never   Smokeless tobacco: Never  Vaping Use   Vaping status: Never Used  Substance and Sexual Activity   Alcohol use: No    Alcohol/week: 0.0 standard drinks of alcohol   Drug use: No   Sexual activity: Not Currently    Partners: Male    Birth control/protection: Post-menopausal    Comment: 1st intercourse- 18, partners- 1,   Other Topics Concern   Not on file  Social History Narrative   Lives alone, with dog.     Widow.  One son.    Caffienated drinks-no   Seat belt use often-yes   Regular Exercise-yes   Smoke alarm in the home-yes   Firearms/guns in the home-no   History of physical abuse-no            Social Drivers of Health   Financial Resource Strain: Medium Risk (11/28/2022)   Overall Financial Resource Strain (CARDIA)     Difficulty of Paying Living Expenses: Somewhat hard  Food Insecurity: Food Insecurity Present (11/28/2022)   Hunger Vital Sign    Worried About Running Out of Food in the Last Year: Sometimes true    Ran Out of Food in the Last Year: Never true  Transportation Needs: No Transportation Needs (11/28/2022)   PRAPARE - Administrator, Civil Service (Medical): No    Lack of Transportation (Non-Medical): No  Physical Activity: Sufficiently Active (11/28/2022)   Exercise Vital Sign    Days of Exercise per Week: 7 days    Minutes of Exercise per Session: 50 min  Stress: No Stress Concern Present (11/28/2022)   [employer] of Occupational Health - Occupational Stress Questionnaire    Feeling of Stress : Only a little  Social Connections: Moderately Integrated (11/28/2022)   Social Connection and Isolation Panel [NHANES]    Frequency of Communication with Friends and Family: More than three times a week    Frequency of Social Gatherings with Friends and Family: More than three times a week    Attends Religious Services: More than 4 times per year    Active Member of [employer] or Organizations: Yes    Attends Banker Meetings: More than 4 times per year    Marital Status: Widowed  Intimate Partner Violence: Not At Risk (06/03/2022)   Humiliation, Afraid, Rape, and Kick questionnaire    Fear of Current or Ex-Partner: No    Emotionally Abused: No    Physically Abused: No    Sexually Abused: No   Family Status  Relation Name Status   Mother  Deceased   Father  Deceased   Sister  (Not Specified)   MGM  (Not Specified)  No partnership data on file   Family History  Problem Relation Age of Onset   Osteoporosis Mother    Breast cancer Mother    Heart disease Father    Heart attack Father 71       37   Hypertension Father    Stroke Sister    Hypertension Sister    Diabetes Sister    Colon polyps Sister    Cancer Maternal Grandmother        Breast and Uterine  Cancer   Breast cancer Maternal Grandmother    Thyroid cancer Maternal Grandmother    Allergies  Allergen Reactions   Atorvastatin    Rosuvastatin     40mg --Creatinine elevation      Review of Systems  Constitutional:  Negative for fever and malaise/fatigue.  HENT:  Negative for congestion.   Eyes:  Negative for blurred vision.  Respiratory:  Negative for cough and shortness of breath.   Cardiovascular:  Negative for chest pain, palpitations and leg swelling.  Gastrointestinal:  Positive for abdominal pain. Negative for vomiting.  Musculoskeletal:  Negative for back pain.  Skin:  Negative for rash.  Neurological:  Negative for loss of consciousness and headaches.  Endo/Heme/Allergies:        Hot flashes       Objective:     BP 118/60 (BP Location: Right Arm, Patient Position: Sitting, Cuff Size: Normal)   Pulse 84   Temp 97.7 F (36.5 C) (Oral)   Resp 18   Ht 5\' 6"  (1.676 m)   Wt 181 lb (82.1 kg)   SpO2 99%   BMI 29.21 kg/m  BP Readings from Last 3 Encounters:  02/02/23 118/60  01/16/23 138/76  12/26/22 112/78   Wt Readings from Last 3 Encounters:  02/02/23 181 lb (82.1 kg)  01/16/23 185 lb 2 oz (84 kg)  12/26/22 181 lb 3.2 oz (82.2 kg)   SpO2 Readings from Last 3 Encounters:  02/02/23 99%  01/16/23 97%  12/26/22 97%      Physical Exam Vitals and nursing note reviewed.  Constitutional:      General: She is not in acute distress.    Appearance: Normal appearance. She is well-developed.  HENT:     Head: Normocephalic and atraumatic.  Eyes:     General: No scleral icterus.       Right eye: No discharge.  Left eye: No discharge.  Cardiovascular:     Rate and Rhythm: Normal rate and regular rhythm.     Heart sounds: No murmur heard. Pulmonary:     Effort: Pulmonary effort is normal. No respiratory distress.     Breath sounds: Normal breath sounds.  Abdominal:     Palpations: There is mass.     Tenderness: There is abdominal tenderness.        Comments: ? Hematoma low abd -- area dog bite,  slightly tender Ecchymosis ---  resolving   Musculoskeletal:        General: Normal range of motion.     Cervical back: Normal range of motion and neck supple.     Right lower leg: No edema.     Left lower leg: No edema.  Skin:    General: Skin is warm and dry.  Neurological:     Mental Status: She is alert and oriented to person, place, and time.  Psychiatric:        Mood and Affect: Mood normal.        Behavior: Behavior normal.        Thought Content: Thought content normal.        Judgment: Judgment normal.      No results found for any visits on 02/02/23.  Last CBC Lab Results  Component Value Date   WBC 5.8 04/28/2022   HGB 12.5 04/28/2022   HCT 39.1 04/28/2022   MCV 84 04/28/2022   MCH 26.8 04/28/2022   RDW 14.4 04/28/2022   PLT 276 04/28/2022   Last metabolic panel Lab Results  Component Value Date   GLUCOSE 94 11/29/2022   NA 141 11/29/2022   K 4.8 11/29/2022   CL 105 11/29/2022   CO2 24 11/29/2022   BUN 31 (H) 11/29/2022   CREATININE 1.78 (H) 11/29/2022   EGFR 30 (L) 11/29/2022   CALCIUM 9.5 11/29/2022   PHOS 4.0 08/05/2020   PROT 6.7 11/29/2022   ALBUMIN 4.4 11/29/2022   LABGLOB 2.3 11/29/2022   BILITOT 0.3 11/29/2022   ALKPHOS 100 11/29/2022   AST 15 11/29/2022   ALT 14 11/29/2022   ANIONGAP 11 12/27/2013   Last lipids Lab Results  Component Value Date   CHOL 153 11/29/2022   HDL 71 11/29/2022   LDLCALC 59 11/29/2022   TRIG 137 11/29/2022   CHOLHDL 2.2 11/29/2022   Last hemoglobin A1c Lab Results  Component Value Date   HGBA1C 5.9 08/27/2020   Last thyroid functions Lab Results  Component Value Date   TSH 1.40 06/08/2022   Last vitamin D Lab Results  Component Value Date   VD25OH 29 (L) 02/12/2019   Last vitamin B12 and Folate Lab Results  Component Value Date   VITAMINB12 728 08/27/2020   FOLATE 13.8 08/27/2020      The 10-year ASCVD risk score (Arnett DK, et  al., 2019) is: 12.3%    Assessment & Plan:   Problem List Items Addressed This Visit       Unprioritized   Hot flashes   Check [place] abd/ pelvis F/u gyn       Dog bite of abdomen   Healing well  + hematoma  Warm compresses  Return to office as needed       Combined abdominal and pelvic pain - Primary   ? Hematoma  With hot flashes will get [place] abd / pelvis F/u gyn      Relevant Orders   US Pelvic Complete With Transvaginal  US Abdomen Complete    Return if symptoms worsen or fail to improve.    Donato Schultz, DO

## 2023-02-02 NOTE — Assessment & Plan Note (Signed)
?   Hematoma  With hot flashes will get [place] abd / pelvis F/u gyn

## 2023-02-02 NOTE — Patient Instructions (Signed)
Animal Bite, Adult Animal bites range from mild to serious. An animal bite can result in any of these injuries: A scratch. A deep, open cut. Broken (punctured) or torn skin. A crush injury. A bone injury. A small bite from a house pet is usually less serious than a bite from a stray or wild animal. Cat bites can be more serious because their long, thin teeth can cause deep puncture wounds that close fast, trapping bacteria inside. Stray or wild animals, such as a raccoon, fox, skunk, or bat, are at higher risk of carrying a serious infection called rabies, which they can pass to a human through a bite. A bite from one of these animals needs medical care right away and, sometimes, rabies vaccination. What increases the risk? You are more likely to be bitten by an animal if: You are around unfamiliar pets. You disturb an animal when it is eating, sleeping, or caring for its babies. You are outdoors in a place where small, wild animals roam freely. What are the signs or symptoms? Common symptoms of an animal bite include: Pain. Bleeding. Swelling. Bruising. How is this diagnosed? This condition may be diagnosed based on a physical exam and medical history. Your health care provider will examine your wound and ask for details about the animal and how the bite happened. You may also have tests, such as: Blood tests to check for infection. X-rays to check for damage to bones or joints. Taking a fluid sample from your wound and checking it for infection (culture test). How is this treated? Treatment depends on the type of animal, where the bite is on your body, and your medical history. Treatment may include: Wound care. This often includes cleaning the wound and rinsing it out (flushing it) with saline solution, which is made of salt and water. A bandage (dressing) is also often applied. In rare cases, the wound may be closed with stitches (sutures), staples, skin glue, or adhesive  strips. Antibiotic medicine to prevent or treat infection. This medicine may be prescribed in pill or ointment form. If the bite area gets infected, the medicine may be given through an IV. A tetanus shot to prevent tetanus infection. Rabies treatment to prevent rabies infection, if the animal could have rabies. Surgery. This may be done if a bite gets infected or causes damage that needs to be repaired. Follow these instructions at home: Medicines Take or apply over-the-counter and prescription medicines only as told by your health care provider. If you were prescribed an antibiotic medicine, take or apply it as told by your health care provider. Do not stop using the antibiotic even if you start to feel better. Wound care  Follow instructions from your health care provider about how to take care of your wound. Make sure you: Wash your hands with soap and water for at least 20 seconds before and after you change your dressing. If soap and water are not available, use hand sanitizer. Change your dressing as told by your health care provider. Leave sutures, skin glue, or adhesive strips in place. These skin closures may need to stay in place for 2 weeks or longer. If adhesive strip edges start to loosen and curl up, you may trim the loose edges. Do not remove adhesive strips completely unless your health care provider tells you to do that. Check your wound every day for signs of infection. Check for: More redness, swelling, or pain. More fluid or blood. Warmth. Pus or a bad smell. General  instructions  Raise (elevate) the injured area above the level of your heart while you are sitting or lying down, if this is possible. If directed, put ice on the injured area. To do this: Put ice in a plastic bag. Place a towel between your skin and the bag. Leave the ice on for 20 minutes, 2-3 times per day. Remove the ice if your skin turns bright red. This is very important. If you cannot feel pain,  heat, or cold, you have a greater risk of damage to the area. Keep all follow-up visits. This is important. Contact a health care provider if: You have more redness, swelling, or pain around your wound. Your wound feels warm to the touch. You have a fever or chills. You have a general feeling of sickness (malaise). You feel nauseous or you vomit. You have pain that does not get better. Get help right away if: You have a red streak that leads away from your wound. You have non-clear fluid or more blood coming from your wound. There is pus or a bad smell coming from your wound. You have trouble moving your injured area. You have numbness or tingling that spreads beyond your wound. Summary Animal bites can range from mild to serious. An animal bite can cause a scratch on the skin, a deep and open cut, torn or punctured skin, a crush injury, or a bone injury. A bite from a stray or wild animal needs medical care right away and, sometimes, rabies vaccination. Your health care provider will examine your wound and ask for details about the animal and how the bite happened. Treatment may include wound care, antibiotic medicine, a tetanus shot, and rabies treatment if the animal could have rabies. This information is not intended to replace advice given to you by your health care provider. Make sure you discuss any questions you have with your health care provider. Document Revised: 02/05/2021 Document Reviewed: 02/05/2021 Elsevier Patient Education  2024 [employer].

## 2023-02-04 ENCOUNTER — Ambulatory Visit (HOSPITAL_BASED_OUTPATIENT_CLINIC_OR_DEPARTMENT_OTHER)
Admission: RE | Admit: 2023-02-04 | Discharge: 2023-02-04 | Disposition: A | Discharge status: Home / Self Care | Payer: Medicare Other | Source: Ambulatory Visit | Attending: Family Medicine | Admitting: Family Medicine

## 2023-02-04 DIAGNOSIS — R109 Unspecified abdominal pain: Secondary | ICD-10-CM | POA: Diagnosis not present

## 2023-02-04 DIAGNOSIS — D259 Leiomyoma of uterus, unspecified: Secondary | ICD-10-CM | POA: Diagnosis not present

## 2023-02-04 DIAGNOSIS — R102 Pelvic and perineal pain: Secondary | ICD-10-CM | POA: Diagnosis not present

## 2023-02-04 DIAGNOSIS — Z9049 Acquired absence of other specified parts of digestive tract: Secondary | ICD-10-CM | POA: Diagnosis not present

## 2023-02-04 DIAGNOSIS — R1032 Left lower quadrant pain: Secondary | ICD-10-CM | POA: Diagnosis not present

## 2023-02-04 DIAGNOSIS — R1033 Periumbilical pain: Secondary | ICD-10-CM | POA: Diagnosis not present

## 2023-02-04 DIAGNOSIS — R609 Edema, unspecified: Secondary | ICD-10-CM | POA: Diagnosis not present

## 2023-02-06 ENCOUNTER — Encounter: Payer: Self-pay | Admitting: Family Medicine

## 2023-02-06 DIAGNOSIS — R9389 Abnormal findings on diagnostic imaging of other specified body structures: Secondary | ICD-10-CM

## 2023-02-10 ENCOUNTER — Other Ambulatory Visit: Payer: Self-pay

## 2023-02-10 DIAGNOSIS — R9389 Abnormal findings on diagnostic imaging of other specified body structures: Secondary | ICD-10-CM

## 2023-02-16 NOTE — Progress Notes (Signed)
 GYNECOLOGY  VISIT   HPI: 73 y.o.   Widowed  Caucasian female   G2P1001 with No LMP recorded. Patient is postmenopausal.   here for: U/S consult. Pt wants to discuss hot flashes and HRT.  Patient recently had pelvic and abdominal ultrasound ordered by her PCP due to new onset of hot flashes and night sweats starting in 2024 and due to pelvic pain.   Study Result  Narrative & Impression  CLINICAL DATA:  late start hot flashes , pelvic pain   EXAM: TRANSABDOMINAL AND TRANSVAGINAL ULTRASOUND OF PELVIS   TECHNIQUE: Both transabdominal and transvaginal ultrasound examinations of the pelvis were performed. Transabdominal technique was performed for global imaging of the pelvis including uterus, ovaries, adnexal regions, and pelvic cul-de-sac. It was necessary to proceed with endovaginal exam following the transabdominal exam to visualize the uterus, endometrium.   COMPARISON:  March 01, 2018, November 01, 2016.   FINDINGS: Uterus   Measurements: 6.0 x 3.1 x 4.8 cm = volume: 41 mL. There is a myometrial echogenic area with posterior acoustic shadowing within the uterine fundus most consistent with a calcified fibroid. This measures approximately 2.1 x 2.1 x 2.8 cm. It demonstrates mass effect on the endometrium   Endometrium   Thickness: 4 mm, remeasured. Portions of the endometrium are obscured. It is mildly heterogeneous in appearance with a favored small amount of anechoic fluid seen on cine images (for example 1-4 image 33).   Right ovary   Not visualized transabdominally or transvaginally. Reportedly surgically absent.   Left ovary   Not visualized transabdominally or transvaginally.   Other findings   No abnormal free fluid.   IMPRESSION: 1. There is a 2.8 cm calcified fibroid within the uterine fundus which demonstrates mass effect on the endometrium. 2. The endometrium is mildly heterogeneous in appearance with a favored small amount of  interdigitating fluid. This is nonspecific and could reflect blood products or sequela of endometrial hyperplasia. Recommend correlation with history of vaginal bleeding. Tissue sampling could be considered for further characterization.     Electronically Signed   By: Clancy Crimes M.D.   On: 02/05/2023 11:08     Pelvic US  02/2018: Small uterine fibroids noted. Endometrium obscured.  Absent right ovary and tube due to surgery.  Left ovary not seen.    No postmenopausal bleeding.  Off HRT in 2021.   Has cardiovascular disease and sees cardiology.   Status post left nephrectomy for renal cancer.  Has elevated creatinine.  Sees Dr. Yvonnie Heritage at [facility].   Had a dog bite around Thanksgiving time.  Saw her PCP.  Took Augmentin .  Still has bruising of her abdominal wall, which is improving per patient.   Moved back to [place] from [place]  in 2023.  Worked at [employer] in the past.   GYNECOLOGIC HISTORY: No LMP recorded. Patient is postmenopausal. Contraception:  PMP Menopausal hormone therapy:  n/a Last 2 paps:  06/15/21 neg, 01/20/17 neg History of abnormal Pap or positive HPV:  no Mammogram:  07/28/22 Breast Density Cat A, BI-RADS CAT 1 neg        OB History     Gravida  2   Para  1   Term  1   Preterm  0   AB  0   Living  1      SAB      IAB      Ectopic  0   Multiple      Live Births  1              Patient Active Problem List   Diagnosis Date Noted   Hot flashes 02/02/2023   Dog bite of abdomen 02/02/2023   Need for influenza vaccination 12/02/2022   Chronic kidney disease, stage 4 (severe) (HCC) 06/02/2022   COVID-19 07/27/2021   Hallucinations 05/06/2021   Viral URI 01/20/2021   Dysphagia    Hyperlipidemia LDL goal <70 12/07/2020   Pellagra-like syndrome (HCC) 08/27/2020   Atherosclerotic heart disease of native coronary artery with other forms of angina pectoris (HCC)    Symptomatic PVCs 12/26/2018    Combined abdominal and pelvic pain 08/28/2017   Degenerative lumbar disc 11/01/2016   Gastroesophageal reflux disease with esophagitis 12/10/2014   Moderate osteopenia 12/10/2014   Vitamin B12 deficiency 12/10/2014   OA (osteoarthritis) of knee 12/16/2013   Prediabetes 12/03/2013   Other screening mammogram 09/25/2012   DJD (degenerative joint disease) of knee 09/09/2011   OSA (obstructive sleep apnea) 07/29/2011   Essential hypertension, benign 07/29/2011    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/10/13   Cancer (HCC)    melanoma in 2 sights   Complication of anesthesia    "chills every evening at sundown x 2 weeks"- no fever   Depression    Dysphagia    History of kidney cancer    left   History of shingles    NO RESIDUAL PROBLEMS   Hypertension    Pain    LOWER BACK PAIN   Sleep apnea    CPAP    Past Surgical History:  Procedure Laterality Date   APPENDECTOMY  1958   COLONOSCOPY     COLONOSCOPY W/ POLYPECTOMY     7 polyps   ESOPHAGEAL MANOMETRY N/A 03/17/2021   Procedure: ESOPHAGEAL MANOMETRY (EM);  Surgeon: Nannette Babe, MD;  Location: [facility];  Service: Gastroenterology;  Laterality: N/A;   FOOT SURGERY Right    removal plantar wart   GALLBLADDER SURGERY  02/14/1990   INJECTION KNEE  09/2011   right    KNEE ARTHROSCOPY  1996   LEFT HEART CATH AND CORONARY ANGIOGRAPHY N/A 07/11/2019   Procedure: LEFT HEART CATH AND CORONARY ANGIOGRAPHY;  Surgeon: Arty Binning, MD;  [facility]: [facility];  Service: Cardiovascular;  Laterality: N/A;   MELANOMA EXCISION  1992   right chin, back   MOHS SURGERY     nose   NEPHRECTOMY Left 2022   POLYPECTOMY     SALPINGOOPHORECTOMY  2009   Right   TOTAL KNEE ARTHROPLASTY Right 06/10/2013   Procedure: RIGHT TOTAL KNEE ARTHROPLASTY;  Surgeon: Aurther Blue, MD;  Location: [facility];  Service: Orthopedics;  Laterality: Right;   TOTAL KNEE  ARTHROPLASTY Left 12/16/2013   Procedure: LEFT TOTAL KNEE ARTHROPLASTY;  Surgeon: Aurther Blue, MD;  Location: [facility];  Service: Orthopedics;  Laterality: Left;    Current Outpatient Medications  Medication Sig Dispense Refill   acetaminophen  (TYLENOL ) 650 MG CR tablet Take 1,300 mg by mouth every 8 (eight) hours as needed for pain.      aspirin  EC 81 MG tablet Take 1 tablet (81 mg total) by mouth daily. 90 tablet 3   cholecalciferol (VITAMIN D3) 25 MCG (1000 UNIT) tablet Take 1,000 Units by mouth daily.     citalopram  (CELEXA ) 20 MG tablet 1 1/2 tab po qd 135 tablet 1   docusate  sodium (COLACE) 100 MG capsule Take by mouth. 1 capsule daily sometimes twice a day     Evolocumab  (REPATHA  SURECLICK) 140 MG/ML SOAJ INJECT INTO THE SKIN EVERY 14 DAYS 6 mL 3   isosorbide  mononitrate (IMDUR ) 60 MG 24 hr tablet Take 1 tablet (60 mg total) by mouth daily. 10 tablet 0   metoprolol  succinate (TOPROL -XL) 25 MG 24 hr tablet TAKE 2 TABLETS IN THE MORNING AND 1 TABLET EVERY  EVENING 270 tablet 3   pantoprazole  (PROTONIX ) 40 MG tablet TAKE 1 TABLET (40 MG TOTAL) BY MOUTH TWICE A DAY BEFORE MEALS 180 tablet 1   ranolazine  (RANEXA ) 500 MG 12 hr tablet TAKE 1 TABLET TWICE A DAY 180 tablet 3   vitamin B-12 (CYANOCOBALAMIN ) 500 MCG tablet Take 500 mcg by mouth daily.     nitroGLYCERIN  (NITROSTAT ) 0.4 MG SL tablet Place 1 tablet (0.4 mg total) under the tongue every 5 (five) minutes as needed for chest pain. (Patient not taking: Reported on 06/08/2022) 25 tablet 3   No current facility-administered medications for this visit.     ALLERGIES: Atorvastatin  and Rosuvastatin   Family History  Problem Relation Age of Onset   Osteoporosis Mother    Breast cancer Mother    Heart disease Father    Heart attack Father 49       66   Hypertension Father    Stroke Sister    Hypertension Sister    Diabetes Sister    Colon polyps Sister    Cancer Maternal Grandmother        Breast and Uterine Cancer   Breast  cancer Maternal Grandmother    Thyroid  cancer Maternal Grandmother     Social History   Socioeconomic History   Marital status: Widowed    Spouse name: Not on file   Number of children: 1   Years of education: Not on file   Highest education level: Associate degree: academic program  Occupational History   Occupation: CMA    Employer: [employer]  Tobacco Use   Smoking status: Never    Passive exposure: Never   Smokeless tobacco: Never  Vaping Use   Vaping status: Never Used  Substance and Sexual Activity   Alcohol use: No    Alcohol/week: 0.0 standard drinks of alcohol   Drug use: No   Sexual activity: Not Currently    Partners: Male    Birth control/protection: Post-menopausal    Comment: 1st intercourse- 18, partners- 1,   Other Topics Concern   Not on file  Social History Narrative   Lives alone, with dog.     Widow.  One son.    Caffienated drinks-no   Seat belt use often-yes   Regular Exercise-yes   Smoke alarm in the home-yes   Firearms/guns in the home-no   History of physical abuse-no            Social Drivers of Health   Financial Resource Strain: Medium Risk (11/28/2022)   Overall Financial Resource Strain (CARDIA)    Difficulty of Paying Living Expenses: Somewhat hard  Food Insecurity: Food Insecurity Present (11/28/2022)   Hunger Vital Sign    Worried About Running Out of Food in the Last Year: Sometimes true    Ran Out of Food in the Last Year: Never true  Transportation Needs: No Transportation Needs (11/28/2022)   PRAPARE - Administrator, Civil Service (Medical): No    Lack of Transportation (Non-Medical): No  Physical Activity: Sufficiently Active (11/28/2022)  Exercise Vital Sign    Days of Exercise per Week: 7 days    Minutes of Exercise per Session: 50 min  Stress: No Stress Concern Present (11/28/2022)   [employer] of Occupational Health - Occupational Stress Questionnaire    Feeling of Stress : Only a little   Social Connections: Moderately Integrated (11/28/2022)   Social Connection and Isolation Panel [NHANES]    Frequency of Communication with Friends and Family: More than three times a week    Frequency of Social Gatherings with Friends and Family: More than three times a week    Attends Religious Services: More than 4 times per year    Active Member of [employer] or Organizations: Yes    Attends Banker Meetings: More than 4 times per year    Marital Status: Widowed  Intimate Partner Violence: Not At Risk (06/03/2022)   Humiliation, Afraid, Rape, and Kick questionnaire    Fear of Current or Ex-Partner: No    Emotionally Abused: No    Physically Abused: No    Sexually Abused: No    Review of Systems  All other systems reviewed and are negative.   PHYSICAL EXAMINATION:   BP 132/84 (BP Location: Left Arm, Patient Position: Sitting, Cuff Size: Small)   Pulse 97   Ht 5' 3.75" (1.619 m)   Wt 181 lb (82.1 kg)   SpO2 (!) 72%   BMI 31.31 kg/m     General appearance: alert, cooperative and appears stated age  ASSESSMENT:  Hot flashes and night sweats.  Uncertain etiology.   Uterine fibroid formation.  Possible fluid in endometrial cavity.  FH breast and uterine cancer.   PLAN:  Pelvic and abdominal report and images reviewed. Limited endometrial evaluation.  Prior US  from 2020 also reviewed.  We discussed potential etiologies of hot flashes and night sweats:  hormonal changes, infection, medications, hyperthyroidism, neurologic conditions.  Will check FSH, estradiol , TSH. Return for office follow up ultrasound.  30 min  total time was spent for this patient encounter, including preparation, face-to-face counseling with the patient, coordination of care, and documentation of the encounter.

## 2023-02-23 DIAGNOSIS — Z85528 Personal history of other malignant neoplasm of kidney: Secondary | ICD-10-CM | POA: Diagnosis not present

## 2023-02-23 DIAGNOSIS — C649 Malignant neoplasm of unspecified kidney, except renal pelvis: Secondary | ICD-10-CM | POA: Diagnosis not present

## 2023-02-23 DIAGNOSIS — I700 Atherosclerosis of aorta: Secondary | ICD-10-CM | POA: Diagnosis not present

## 2023-03-01 ENCOUNTER — Encounter: Payer: Self-pay | Admitting: Obstetrics and Gynecology

## 2023-03-01 ENCOUNTER — Ambulatory Visit (INDEPENDENT_AMBULATORY_CARE_PROVIDER_SITE_OTHER): Payer: Medicare Other | Admitting: Obstetrics and Gynecology

## 2023-03-01 VITALS — BP 132/84 | HR 97 | Ht 63.75 in | Wt 181.0 lb

## 2023-03-01 DIAGNOSIS — D259 Leiomyoma of uterus, unspecified: Secondary | ICD-10-CM

## 2023-03-01 DIAGNOSIS — R232 Flushing: Secondary | ICD-10-CM | POA: Diagnosis not present

## 2023-03-01 DIAGNOSIS — R61 Generalized hyperhidrosis: Secondary | ICD-10-CM | POA: Diagnosis not present

## 2023-03-02 ENCOUNTER — Encounter: Payer: Self-pay | Admitting: Obstetrics and Gynecology

## 2023-03-02 LAB — FOLLICLE STIMULATING HORMONE: FSH: 124 m[IU]/mL — ABNORMAL HIGH

## 2023-03-02 LAB — TSH: TSH: 1.47 m[IU]/L (ref 0.40–4.50)

## 2023-03-02 LAB — ESTRADIOL: Estradiol: <15 pg/mL

## 2023-03-09 NOTE — Progress Notes (Signed)
 GYNECOLOGY  VISIT   HPI: 73 y.o.   Widowed  Caucasian female   G2P1001 with No LMP recorded. Patient is postmenopausal.   here for: U/S consult follow up.  Patient has experienced new onset of hot flashes and pelvic discomfort.   Her PCP ordered a pelvic ultrasound which showed a calcified uterine fibroid and somewhat obscured endometrium with a small amount of fluid in uterine cavity.  EMS 4 mm. Right ovary absent and left ovary not seen.    US  today is done for a second opinion.  Still has hot flashes.  Some days very little hot flashes and sometimes has horrific hot flashes.   No vaginal bleeding or spotting.   Labs 03/01/23: TSH 1.47 E2 < 15 FSH 124  GYNECOLOGIC HISTORY: No LMP recorded. Patient is postmenopausal. Contraception:  PMP Menopausal hormone therapy:  n/a Last 2 paps:  06/15/21 neg, 01/20/17 neg  History of abnormal Pap or positive HPV:  no Mammogram:   07/28/22 Breast Density Cat A, BI-RADS CAT 1 neg         OB History     Gravida  2   Para  1   Term  1   Preterm  0   AB  0   Living  1      SAB      IAB      Ectopic  0   Multiple      Live Births  1              Patient Active Problem List   Diagnosis Date Noted   Hot flashes 02/02/2023   Dog bite of abdomen 02/02/2023   Need for influenza vaccination 12/02/2022   Chronic kidney disease, stage 4 (severe) (HCC) 06/02/2022   COVID-19 07/27/2021   Hallucinations 05/06/2021   Viral URI 01/20/2021   Dysphagia    Hyperlipidemia LDL goal <70 12/07/2020   Pellagra-like syndrome (HCC) 08/27/2020   Atherosclerotic heart disease of native coronary artery with other forms of angina pectoris (HCC)    Symptomatic PVCs 12/26/2018   Combined abdominal and pelvic pain 08/28/2017   Degenerative lumbar disc 11/01/2016   Gastroesophageal reflux disease with esophagitis 12/10/2014   Moderate osteopenia 12/10/2014   Vitamin B12 deficiency 12/10/2014   OA (osteoarthritis) of knee 12/16/2013    Prediabetes 12/03/2013   Other screening mammogram 09/25/2012   DJD (degenerative joint disease) of knee 09/09/2011   OSA (obstructive sleep apnea) 07/29/2011   Essential hypertension, benign 07/29/2011    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/10/13   Cancer (HCC)    melanoma in 2 sights   Complication of anesthesia    chills every evening at sundown x 2 weeks- no fever   Depression    Dysphagia    History of kidney cancer    left   History of shingles    NO RESIDUAL PROBLEMS   Hypertension    Pain    LOWER BACK PAIN   Sleep apnea    CPAP    Past Surgical History:  Procedure Laterality Date   APPENDECTOMY  1958   COLONOSCOPY     COLONOSCOPY W/ POLYPECTOMY     7 polyps   ESOPHAGEAL MANOMETRY N/A 03/17/2021   Procedure: ESOPHAGEAL MANOMETRY (EM);  Surgeon: Albertus Gordy HERO, MD;  Location: [facility];  Service: Gastroenterology;  Laterality: N/A;   FOOT SURGERY Right  removal plantar wart   GALLBLADDER SURGERY  02/14/1990   INJECTION KNEE  09/2011   right    KNEE ARTHROSCOPY  1996   LEFT HEART CATH AND CORONARY ANGIOGRAPHY N/A 07/11/2019   Procedure: LEFT HEART CATH AND CORONARY ANGIOGRAPHY;  Surgeon: Claudene Victory ORN, MD;  [facility]: [facility];  Service: Cardiovascular;  Laterality: N/A;   MELANOMA EXCISION  1992   right chin, back   MOHS SURGERY     nose   NEPHRECTOMY Left 2022   POLYPECTOMY     SALPINGOOPHORECTOMY  2009   Right   TOTAL KNEE ARTHROPLASTY Right 06/10/2013   Procedure: RIGHT TOTAL KNEE ARTHROPLASTY;  Surgeon: Dempsey LULLA Moan, MD;  Location: [facility];  Service: Orthopedics;  Laterality: Right;   TOTAL KNEE ARTHROPLASTY Left 12/16/2013   Procedure: LEFT TOTAL KNEE ARTHROPLASTY;  Surgeon: Dempsey Moan LULLA, MD;  Location: [facility];  Service: Orthopedics;  Laterality: Left;    Current Outpatient Medications  Medication Sig Dispense Refill   acetaminophen  (TYLENOL ) 650 MG  CR tablet Take 1,300 mg by mouth every 8 (eight) hours as needed for pain.      aspirin  EC 81 MG tablet Take 1 tablet (81 mg total) by mouth daily. 90 tablet 3   cholecalciferol (VITAMIN D3) 25 MCG (1000 UNIT) tablet Take 1,000 Units by mouth daily.     citalopram  (CELEXA ) 20 MG tablet 1 1/2 tab po qd 135 tablet 1   docusate sodium  (COLACE) 100 MG capsule Take by mouth. 1 capsule daily sometimes twice a day     Evolocumab  (REPATHA  SURECLICK) 140 MG/ML SOAJ INJECT INTO THE SKIN EVERY 14 DAYS 6 mL 3   isosorbide  mononitrate (IMDUR ) 60 MG 24 hr tablet Take 1 tablet (60 mg total) by mouth daily. 10 tablet 0   metoprolol  succinate (TOPROL -XL) 25 MG 24 hr tablet TAKE 2 TABLETS IN THE MORNING AND 1 TABLET EVERY  EVENING 270 tablet 3   ranolazine  (RANEXA ) 500 MG 12 hr tablet TAKE 1 TABLET TWICE A DAY 180 tablet 3   vitamin B-12 (CYANOCOBALAMIN ) 500 MCG tablet Take 500 mcg by mouth daily.     nitroGLYCERIN  (NITROSTAT ) 0.4 MG SL tablet Place 1 tablet (0.4 mg total) under the tongue every 5 (five) minutes as needed for chest pain. (Patient not taking: Reported on 06/08/2022) 25 tablet 3   pantoprazole  (PROTONIX ) 40 MG tablet TAKE 1 TABLET (40 MG TOTAL) BY MOUTH TWICE A DAY BEFORE MEALS (Patient not taking: Reported on 03/23/2023) 180 tablet 1   No current facility-administered medications for this visit.     ALLERGIES: Atorvastatin  and Rosuvastatin   Family History  Problem Relation Age of Onset   Osteoporosis Mother    Breast cancer Mother    Heart disease Father    Heart attack Father 72       51   Hypertension Father    Stroke Sister    Hypertension Sister    Diabetes Sister    Colon polyps Sister    Cancer Maternal Grandmother        Breast and Uterine Cancer   Breast cancer Maternal Grandmother    Thyroid  cancer Maternal Grandmother     Social History   Socioeconomic History   Marital status: Widowed    Spouse name: Not on file   Number of children: 1   Years of education: Not on  file   Highest education level: Associate degree: academic program  Occupational History   Occupation: CMA  Employer: [employer]  Tobacco Use   Smoking status: Never    Passive exposure: Never   Smokeless tobacco: Never  Vaping Use   Vaping status: Never Used  Substance and Sexual Activity   Alcohol use: No    Alcohol/week: 0.0 standard drinks of alcohol   Drug use: No   Sexual activity: Not Currently    Partners: Male    Birth control/protection: Post-menopausal    Comment: 1st intercourse- 18, partners- 1,   Other Topics Concern   Not on file  Social History Narrative   Lives alone, with dog.     Widow.  One son.    Caffienated drinks-no   Seat belt use often-yes   Regular Exercise-yes   Smoke alarm in the home-yes   Firearms/guns in the home-no   History of physical abuse-no            Social Drivers of Health   Financial Resource Strain: Medium Risk (11/28/2022)   Overall Financial Resource Strain (CARDIA)    Difficulty of Paying Living Expenses: Somewhat hard  Food Insecurity: Food Insecurity Present (11/28/2022)   Hunger Vital Sign    Worried About Running Out of Food in the Last Year: Sometimes true    Ran Out of Food in the Last Year: Never true  Transportation Needs: No Transportation Needs (11/28/2022)   PRAPARE - Administrator, Civil Service (Medical): No    Lack of Transportation (Non-Medical): No  Physical Activity: Sufficiently Active (11/28/2022)   Exercise Vital Sign    Days of Exercise per Week: 7 days    Minutes of Exercise per Session: 50 min  Stress: No Stress Concern Present (11/28/2022)   [employer] of Occupational Health - Occupational Stress Questionnaire    Feeling of Stress : Only a little  Social Connections: Moderately Integrated (11/28/2022)   Social Connection and Isolation Panel [NHANES]    Frequency of Communication with Friends and Family: More than three times a week    Frequency of Social Gatherings with  Friends and Family: More than three times a week    Attends Religious Services: More than 4 times per year    Active Member of [employer] or Organizations: Yes    Attends Banker Meetings: More than 4 times per year    Marital Status: Widowed  Intimate Partner Violence: Not At Risk (06/03/2022)   Humiliation, Afraid, Rape, and Kick questionnaire    Fear of Current or Ex-Partner: No    Emotionally Abused: No    Physically Abused: No    Sexually Abused: No    Review of Systems  All other systems reviewed and are negative.   PHYSICAL EXAMINATION:   BP (!) 104/58 (BP Location: Right Arm, Patient Position: Sitting, Cuff Size: Normal)   Pulse 74   Wt 183 lb (83 kg)   SpO2 92%   BMI 31.66 kg/m     General appearance: alert, cooperative and appears stated age   Pelvic US  Uterus 6.57 x 3.76 x 3.26 cm.  Intramural fibroid:  2.92 cm, calcified, fundal.  EMS 3.35 mm with sliver of fluid in endometrial cavity.  Fundal fibroid distorts the cavity slightly.  Left ovary 1.87 x 0.99 x 1.48 cm.  6.4 mm follicle noted.  Right ovary absent.  No adnexal masses.  No free fluid.   ASSESSMENT:  Hot flashes versus hyperhidrosis.  I suspect that this is of nongynecologic origin.  Uterine fibroid.  Reassuring endometrial stripe.   PLAN:  We  reviewed her images and report and discussed her fibroid and endometrial findings.  Reassurance given.  We discussed the etiology of hot flashes and hyperhidrosis.   If she has continued hot flashes, I discussed referral to endocrinology, which patient currently declines today.  Return for breast and pelvic exam in June.  25 min  total time was spent for this patient encounter, including preparation, face-to-face counseling with the patient, coordination of care, and documentation of the encounter.

## 2023-03-23 ENCOUNTER — Ambulatory Visit (INDEPENDENT_AMBULATORY_CARE_PROVIDER_SITE_OTHER): Payer: Medicare Other | Admitting: Obstetrics and Gynecology

## 2023-03-23 ENCOUNTER — Other Ambulatory Visit: Payer: Self-pay | Admitting: Obstetrics and Gynecology

## 2023-03-23 ENCOUNTER — Ambulatory Visit: Payer: Medicare Other

## 2023-03-23 VITALS — BP 104/58 | HR 74 | Wt 183.0 lb

## 2023-03-23 DIAGNOSIS — D259 Leiomyoma of uterus, unspecified: Secondary | ICD-10-CM

## 2023-03-23 DIAGNOSIS — R61 Generalized hyperhidrosis: Secondary | ICD-10-CM

## 2023-03-23 DIAGNOSIS — R232 Flushing: Secondary | ICD-10-CM

## 2023-03-23 DIAGNOSIS — D251 Intramural leiomyoma of uterus: Secondary | ICD-10-CM

## 2023-03-23 NOTE — Patient Instructions (Signed)
Hyperhidrosis Hyperhidrosis is a condition in which the body sweats a lot more than normal (excessively). Sweating is a necessary function for a human body. It is normal to sweat when you are hot, physically active, or anxious. However, hyperhidrosis is sweating to an excessive degree. Although the condition is not a serious one, it can cause embarrassment. There are two kinds of hyperhidrosis: Primary hyperhidrosis. The sweating usually localizes in one part of your body, such as your underarms, or in a few areas, such as your feet, face, underarms, and hands. This is the more common kind of hyperhidrosis. Secondary hyperhidrosis. This type usually affects your entire body. What are the causes? The cause of this condition depends on the kind of hyperhidrosis that you have. Primary hyperhidrosis may be caused by sweat glands that are more active than normal. Secondary hyperhidrosis may be caused by an underlying condition or by taking certain medicines, such as antidepressants or diabetes medicines. Possible conditions that may cause secondary hyperhidrosis include: Diabetes. Hot flashes during menopause. Certain types of cancers. Obesity. Overactive thyroid (hyperthyroidism). Nervous system injury or disorders. What increases the risk? You are more likely to develop primary hyperhidrosis if you have a family history of the condition. What are the signs or symptoms? Symptoms of this condition include: Feeling like you are sweating constantly, even while you are not being active. Having skin that peels or gets paler or softer in the areas where you sweat the most. Being able to see sweat on your skin. Other symptoms depend on the kind of hyperhidrosis that you have. Symptoms of primary hyperhidrosis may include: Sweating in the same location on both sides of your body. Sweating only during the day and not while you are sleeping. Sweating in specific areas, such as your underarms, palms,  feet, and face. Symptoms of secondary hyperhidrosis may include: Sweating all over your body. Sweating even while you sleep. How is this diagnosed? This condition may be diagnosed by: Medical history. Physical exam. You may also have other tests, including: Tests to measure the amount of sweat you produce and to show the areas where you sweat the most. These tests may involve: Using color-changing chemicals to show patterns of sweating on the skin. Weighing paper that has been applied to the skin. This will show the amount of sweat that your body produces. Measuring the amount of water that evaporates from the skin. Using infrared technology to show patterns of sweating on the skin. Tests to check for other conditions that may be causing excess sweating. These tests may include blood, urine, or imaging tests. How is this treated? Treatment for this condition depends on the kind of hyperhidrosis that you have and the areas of your body that are affected. Your health care provider will also treat any underlying conditions. Treatment may include: Medicines, such as: Antiperspirants. These are medicines that stop sweat. Injectable medicines. These may include small injections of botulinum toxin. Oral medicines. These are taken by mouth to treat underlying conditions and other symptoms. A procedure to: Temporarily turn off the sweat glands in your hands and feet (iontophoresis). Remove your sweat glands. Cut or destroy the nerves so that they do not send a signal to the sweat glands (sympathectomy). Follow these instructions at home: Lifestyle  Limit or avoid foods or beverages that may increase your risk of sweating, such as: Spicy food. Caffeine. Alcohol. Foods that contain monosodium glutamate (MSG). If your feet sweat: Wear sandals when possible. Do not wear cotton socks. Wear socks  that remove or wick moisture from your feet. Wear leather shoes. Avoid wearing the same pair of  shoes for two days in a row. Try placing sweat pads under your clothes to prevent underarm sweat from showing. Keep a journal of your sweat symptoms and when they occur. This may help you identify things that trigger your sweating. General instructions Take over-the-counter and prescription medicines only as told by your health care provider. Use antiperspirants as told by your health care provider. Consider joining a hyperhidrosis support group. Where to find more information [employer]: [employer] Contact a health care provider if: You have new symptoms. Your symptoms get worse. Summary Hyperhidrosis is a condition in which the body sweats a lot more than normal. With primary hyperhidrosis, the sweating usually localizes in one part of your body, such as your underarms, or in a few areas, such as your feet, face, underarms, and hands. It is caused by overactive sweat glands in the affected area. With secondary hyperhidrosis, the sweating affects your entire body. This is caused by an underlying condition or by taking certain medicines. Treatment for this condition depends on the kind of hyperhidrosis that you have and the parts of your body that are affected. This information is not intended to replace advice given to you by your health care provider. Make sure you discuss any questions you have with your health care provider. Document Revised: 03/30/2021 Document Reviewed: 03/30/2021 Elsevier Patient Education  2024 [employer].

## 2023-03-24 ENCOUNTER — Encounter: Payer: Self-pay | Admitting: Obstetrics and Gynecology

## 2023-03-24 DIAGNOSIS — N184 Chronic kidney disease, stage 4 (severe): Secondary | ICD-10-CM | POA: Diagnosis not present

## 2023-03-30 DIAGNOSIS — N189 Chronic kidney disease, unspecified: Secondary | ICD-10-CM | POA: Diagnosis not present

## 2023-03-30 DIAGNOSIS — I2510 Atherosclerotic heart disease of native coronary artery without angina pectoris: Secondary | ICD-10-CM | POA: Diagnosis not present

## 2023-03-30 DIAGNOSIS — Z905 Acquired absence of kidney: Secondary | ICD-10-CM | POA: Diagnosis not present

## 2023-03-30 DIAGNOSIS — N184 Chronic kidney disease, stage 4 (severe): Secondary | ICD-10-CM | POA: Diagnosis not present

## 2023-03-30 DIAGNOSIS — C642 Malignant neoplasm of left kidney, except renal pelvis: Secondary | ICD-10-CM | POA: Diagnosis not present

## 2023-03-30 DIAGNOSIS — D631 Anemia in chronic kidney disease: Secondary | ICD-10-CM | POA: Diagnosis not present

## 2023-04-03 ENCOUNTER — Other Ambulatory Visit: Payer: Self-pay | Admitting: Obstetrics and Gynecology

## 2023-04-03 DIAGNOSIS — Z1231 Encounter for screening mammogram for malignant neoplasm of breast: Secondary | ICD-10-CM

## 2023-05-14 ENCOUNTER — Other Ambulatory Visit: Payer: Self-pay | Admitting: Cardiology

## 2023-05-14 DIAGNOSIS — E785 Hyperlipidemia, unspecified: Secondary | ICD-10-CM

## 2023-05-14 DIAGNOSIS — I25118 Atherosclerotic heart disease of native coronary artery with other forms of angina pectoris: Secondary | ICD-10-CM

## 2023-05-15 ENCOUNTER — Other Ambulatory Visit: Payer: Self-pay | Admitting: Cardiology

## 2023-05-15 DIAGNOSIS — F3342 Major depressive disorder, recurrent, in full remission: Secondary | ICD-10-CM

## 2023-05-19 ENCOUNTER — Other Ambulatory Visit (HOSPITAL_COMMUNITY): Payer: Self-pay

## 2023-05-19 ENCOUNTER — Telehealth: Payer: Self-pay | Admitting: Pharmacy Technician

## 2023-05-19 ENCOUNTER — Telehealth: Payer: Self-pay | Admitting: Cardiology

## 2023-05-19 NOTE — Telephone Encounter (Signed)
 Patient Advocate Encounter   The patient was approved for a [employer] grant that will help cover the cost of Repatha Total amount awarded, 2500.00.  Effective: 04/19/23 - 04/17/24   [id number] WUJ:[id number] WGNFA:[id number] QI:[id number]  [employer] ID: [id number]   Pharmacy provided with approval and processing information. Patient informed via mychart

## 2023-05-19 NOTE — Telephone Encounter (Signed)
 Pt c/o medication issue:  1. Name of Medication:   Evolocumab (REPATHA SURECLICK) 140 MG/ML SOAJ    2. How are you currently taking this medication (dosage and times per day)? As written  3. Are you having a reaction (difficulty breathing--STAT)? No   4. What is your medication issue?  Pt would like a c/b in regards to getting a grant for this medication. Please advise

## 2023-05-29 NOTE — Telephone Encounter (Signed)
 Pt went to [facility] on [address] and they do not have her grant info for Repatha. Requesting cb

## 2023-05-29 NOTE — Telephone Encounter (Addendum)
 Hi, I spoke to the pharmacy and it is now going through for 0.00. pt is aware per pharmacy.

## 2023-05-29 NOTE — Telephone Encounter (Signed)
 Spoke with pt regarding her medication. Pt stated she went to the [facility] on [address] and was told her prescription was $300. Pt admitted she left the pharmacy without questioning it. Pt stated she plans to go back and ask more questions regarding the grant and her prescription. Pt was told the information provided would be forwarded to our rx team. Pt verbalized understanding. All questions if any were answered.

## 2023-05-30 ENCOUNTER — Encounter: Payer: Self-pay | Admitting: Family Medicine

## 2023-05-30 ENCOUNTER — Ambulatory Visit (INDEPENDENT_AMBULATORY_CARE_PROVIDER_SITE_OTHER): Payer: Medicare Other | Admitting: Family Medicine

## 2023-05-30 VITALS — BP 128/80 | HR 60 | Temp 97.8°F | Resp 16 | Ht 63.75 in | Wt 184.8 lb

## 2023-05-30 DIAGNOSIS — F3342 Major depressive disorder, recurrent, in full remission: Secondary | ICD-10-CM

## 2023-05-30 DIAGNOSIS — E785 Hyperlipidemia, unspecified: Secondary | ICD-10-CM | POA: Diagnosis not present

## 2023-05-30 DIAGNOSIS — I10 Essential (primary) hypertension: Secondary | ICD-10-CM | POA: Diagnosis not present

## 2023-05-30 DIAGNOSIS — N184 Chronic kidney disease, stage 4 (severe): Secondary | ICD-10-CM

## 2023-05-30 DIAGNOSIS — E538 Deficiency of other specified B group vitamins: Secondary | ICD-10-CM | POA: Diagnosis not present

## 2023-05-30 LAB — COMPREHENSIVE METABOLIC PANEL WITH GFR
ALT: 10 U/L (ref 0–35)
AST: 12 U/L (ref 0–37)
Albumin: 4.5 g/dL (ref 3.5–5.2)
Alkaline Phosphatase: 85 U/L (ref 39–117)
BUN: 32 mg/dL — ABNORMAL HIGH (ref 6–23)
CO2: 27 meq/L (ref 19–32)
Calcium: 9.2 mg/dL (ref 8.4–10.5)
Chloride: 105 meq/L (ref 96–112)
Creatinine, Ser: 1.85 mg/dL — ABNORMAL HIGH (ref 0.40–1.20)
GFR: 26.85 mL/min — ABNORMAL LOW (ref >60.00)
Glucose, Bld: 99 mg/dL (ref 70–99)
Potassium: 4.6 meq/L (ref 3.5–5.1)
Sodium: 139 meq/L (ref 135–145)
Total Bilirubin: 0.5 mg/dL (ref 0.2–1.2)
Total Protein: 6.5 g/dL (ref 6.0–8.3)

## 2023-05-30 LAB — LIPID PANEL
Cholesterol: 145 mg/dL (ref 0–200)
HDL: 61.5 mg/dL (ref >39.00)
LDL Cholesterol: 52 mg/dL (ref 0–99)
NonHDL: 83.63
Total CHOL/HDL Ratio: 2
Triglycerides: 156 mg/dL — ABNORMAL HIGH (ref 0.0–149.0)
VLDL: 31.2 mg/dL (ref 0.0–40.0)

## 2023-05-30 LAB — VITAMIN B12: Vitamin B-12: 965 pg/mL — ABNORMAL HIGH (ref 211–911)

## 2023-05-30 MED ORDER — CITALOPRAM HYDROBROMIDE 20 MG PO TABS: ORAL_TABLET | ORAL | 3 refills | Status: DC

## 2023-05-30 NOTE — Patient Instructions (Signed)
 Cholesterol Content in Foods Cholesterol is a waxy, fat-like substance that helps to carry fat in the blood. The body needs cholesterol in small amounts, but too much cholesterol can cause damage to the arteries and heart. What foods have cholesterol?  Cholesterol is found in animal-based foods, such as meat, seafood, and dairy. Generally, low-fat dairy and lean meats have less cholesterol than full-fat dairy and fatty meats. The milligrams of cholesterol per serving (mg per serving) of common cholesterol-containing foods are listed below. Meats and other proteins Egg -- one large whole egg has 186 mg. Veal shank -- 4 oz (113 g) has 141 mg. Lean ground [place] (93% lean) -- 4 oz (113 g) has 118 mg. Fat-trimmed lamb loin -- 4 oz (113 g) has 106 mg. Lean ground beef (90% lean) -- 4 oz (113 g) has 100 mg. Lobster -- 3.5 oz (99 g) has 90 mg. Pork loin chops -- 4 oz (113 g) has 86 mg. Canned salmon -- 3.5 oz (99 g) has 83 mg. Fat-trimmed beef top loin -- 4 oz (113 g) has 78 mg. Frankfurter -- 1 frank (3.5 oz or 99 g) has 77 mg. Crab -- 3.5 oz (99 g) has 71 mg. Roasted chicken without skin, white meat -- 4 oz (113 g) has 66 mg. Light bologna -- 2 oz (57 g) has 45 mg. Deli-cut [place] -- 2 oz (57 g) has 31 mg. Canned tuna -- 3.5 oz (99 g) has 31 mg. Tomasa Blase -- 1 oz (28 g) has 29 mg. Oysters and mussels (raw) -- 3.5 oz (99 g) has 25 mg. Mackerel -- 1 oz (28 g) has 22 mg. Trout -- 1 oz (28 g) has 20 mg. Pork sausage -- 1 link (1 oz or 28 g) has 17 mg. Salmon -- 1 oz (28 g) has 16 mg. Tilapia -- 1 oz (28 g) has 14 mg. Dairy Soft-serve ice cream --  cup (4 oz or 86 g) has 103 mg. Whole-milk yogurt -- 1 cup (8 oz or 245 g) has 29 mg. Cheddar cheese -- 1 oz (28 g) has 28 mg. American cheese -- 1 oz (28 g) has 28 mg. Whole milk -- 1 cup (8 oz or 250 mL) has 23 mg. 2% milk -- 1 cup (8 oz or 250 mL) has 18 mg. Cream cheese -- 1 tablespoon (Tbsp) (14.5 g) has 15 mg. Cottage cheese --  cup (4 oz or  113 g) has 14 mg. Low-fat (1%) milk -- 1 cup (8 oz or 250 mL) has 10 mg. Sour cream -- 1 Tbsp (12 g) has 8.5 mg. Low-fat yogurt -- 1 cup (8 oz or 245 g) has 8 mg. Nonfat Greek yogurt -- 1 cup (8 oz or 228 g) has 7 mg. Half-and-half cream -- 1 Tbsp (15 mL) has 5 mg. Fats and oils Cod liver oil -- 1 tablespoon (Tbsp) (13.6 g) has 82 mg. Butter -- 1 Tbsp (14 g) has 15 mg. Lard -- 1 Tbsp (12.8 g) has 14 mg. Bacon grease -- 1 Tbsp (12.9 g) has 14 mg. Mayonnaise -- 1 Tbsp (13.8 g) has 5-10 mg. Margarine -- 1 Tbsp (14 g) has 3-10 mg. The items listed above may not be a complete list of foods with cholesterol. Exact amounts of cholesterol in these foods may vary depending on specific ingredients and brands. Contact a dietitian for more information. What foods do not have cholesterol? Most plant-based foods do not have cholesterol unless you combine them with a food that has  cholesterol. Foods without cholesterol include: Grains and cereals. Vegetables. Fruits. Vegetable oils, such as olive, canola, and sunflower oil. Legumes, such as peas, beans, and lentils. Nuts and seeds. Egg whites. The items listed above may not be a complete list of foods that do not have cholesterol. Contact a dietitian for more information. Summary The body needs cholesterol in small amounts, but too much cholesterol can cause damage to the arteries and heart. Cholesterol is found in animal-based foods, such as meat, seafood, and dairy. Generally, low-fat dairy and lean meats have less cholesterol than full-fat dairy and fatty meats. This information is not intended to replace advice given to you by your health care provider. Make sure you discuss any questions you have with your health care provider. Document Revised: 06/12/2020 Document Reviewed: 06/12/2020 Elsevier Patient Education  2024 [employer].

## 2023-05-30 NOTE — Progress Notes (Signed)
 Established Patient Office Visit  Subjective   Patient ID: Kelsey Church, female    DOB: April 07, 1950  Age: 73 y.o. MRN: [id number]  Chief Complaint  Patient presents with   Follow-up    HPI Discussed the use of AI scribe software for clinical note transcription with the patient, who gave verbal consent to proceed.  History of Present Illness Kelsey Church "Kelsey Church" is a 73 year old female who presents for medication management and follow-up on her current health status.  She is currently taking Celexa for depression but finds it ineffective for achieving deep sleep unless she takes two Tylenol. She experiences occasional vivid dreams involving family members and is considering reducing her caffeine intake, particularly sweet tea, to improve her sleep quality.  She has a history of kidney cancer and underwent a CT scan earlier this year to establish a baseline and check for recurrence, which was normal. She is under the care of a kidney doctor and a urologist, as her previous urologist retired.  She describes experiencing a sensation on her left side, which she associates with ovulation, despite having only one ovarian tube. She had an ovarian cyst removed in the late 2000s, along with the right tube. Recently, she felt discomfort that made it hard to walk, lasting about half a day, with no bleeding or cramping.  She takes B12 and vitamin D supplements regularly and reports occasional swelling in her ankles, particularly noticeable at the end of the day.  Her social activities include walking her dog, volunteering, and participating in a weekly women's Bible study. She mentions going on a mission trip to [place], which she has done twice before.  She has a history of foot issues, including a bunion and Morton's neuroma, which require her to wear specific types of shoes for comfort. She cannot walk barefoot even for short distances due to discomfort.   Patient Active Problem List    Diagnosis Date Noted   Hot flashes 02/02/2023   Dog bite of abdomen 02/02/2023   Need for influenza vaccination 12/02/2022   Chronic kidney disease, stage 4 (severe) (HCC) 06/02/2022   COVID-19 07/27/2021   Hallucinations 05/06/2021   Viral URI 01/20/2021   Dysphagia    Hyperlipidemia LDL goal <70 12/07/2020   Pellagra-like syndrome (HCC) 08/27/2020   Atherosclerotic heart disease of native coronary artery with other forms of angina pectoris (HCC)    Symptomatic PVCs 12/26/2018   Combined abdominal and pelvic pain 08/28/2017   Degenerative lumbar disc 11/01/2016   Gastroesophageal reflux disease with esophagitis 12/10/2014   Moderate osteopenia 12/10/2014   Vitamin B12 deficiency 12/10/2014   OA (osteoarthritis) of Church 12/16/2013   Prediabetes 12/03/2013   Other screening mammogram 09/25/2012   DJD (degenerative joint disease) of Church 09/09/2011   OSA (obstructive sleep apnea) 07/29/2011   Essential hypertension, benign 07/29/2011   Past Medical History:  Diagnosis Date   Arthritis    PAIN AND OA LEFT Church; S/P RIGHT TOTAL Church ARTHROPLASTY 06/10/13   Cancer (HCC)    melanoma in 2 sights   Complication of anesthesia    "chills every evening at sundown x 2 weeks"- no fever   Depression    Dysphagia    History of kidney cancer    left   History of shingles    NO RESIDUAL PROBLEMS   Hypertension    Pain    LOWER BACK PAIN   Sleep apnea    CPAP   Past Surgical History:  Procedure Laterality  Date   APPENDECTOMY  1958   COLONOSCOPY     COLONOSCOPY W/ POLYPECTOMY     7 polyps   ESOPHAGEAL MANOMETRY N/A 03/17/2021   Procedure: ESOPHAGEAL MANOMETRY (EM);  Surgeon: Nannette Babe, MD;  Location: [facility];  Service: Gastroenterology;  Laterality: N/A;   FOOT SURGERY Right    removal plantar wart   GALLBLADDER SURGERY  02/14/1990   INJECTION Church  09/2011   right    Church ARTHROSCOPY  1996   LEFT HEART CATH AND CORONARY ANGIOGRAPHY N/A 07/11/2019   Procedure: LEFT  HEART CATH AND CORONARY ANGIOGRAPHY;  Surgeon: Arty Binning, MD;  Location: [facility];  Service: Cardiovascular;  Laterality: N/A;   MELANOMA EXCISION  1992   right chin, back   MOHS SURGERY     nose   NEPHRECTOMY Left 2022   POLYPECTOMY     SALPINGOOPHORECTOMY  2009   Right   TOTAL Church ARTHROPLASTY Right 06/10/2013   Procedure: RIGHT TOTAL Church ARTHROPLASTY;  Surgeon: Aurther Blue, MD;  Location: [facility];  Service: Orthopedics;  Laterality: Right;   TOTAL Church ARTHROPLASTY Left 12/16/2013   Procedure: LEFT TOTAL Church ARTHROPLASTY;  Surgeon: Aurther Blue, MD;  Location: [facility];  Service: Orthopedics;  Laterality: Left;   Social History   Tobacco Use   Smoking status: Never    Passive exposure: Never   Smokeless tobacco: Never  Vaping Use   Vaping status: Never Used  Substance Use Topics   Alcohol use: No    Alcohol/week: 0.0 standard drinks of alcohol   Drug use: No   Social History   Socioeconomic History   Marital status: Widowed    Spouse name: Not on file   Number of children: 1   Years of education: Not on file   Highest education level: Associate degree: academic program  Occupational History   Occupation: CMA    Employer: [employer]  Tobacco Use   Smoking status: Never    Passive exposure: Never   Smokeless tobacco: Never  Vaping Use   Vaping status: Never Used  Substance and Sexual Activity   Alcohol use: No    Alcohol/week: 0.0 standard drinks of alcohol   Drug use: No   Sexual activity: Not Currently    Partners: Male    Birth control/protection: Post-menopausal    Comment: 1st intercourse- 18, partners- 1,   Other Topics Concern   Not on file  Social History Narrative   Lives alone, with dog.     Widow.  One son.    Caffienated drinks-no   Seat belt use often-yes   Regular Exercise-yes   Smoke alarm in the home-yes   Firearms/guns in the home-no   History of physical abuse-no            Social Drivers of Health    Financial Resource Strain: Medium Risk (11/28/2022)   Overall Financial Resource Strain (CARDIA)    Difficulty of Paying Living Expenses: Somewhat hard  Food Insecurity: Food Insecurity Present (11/28/2022)   Hunger Vital Sign    Worried About Running Out of Food in the Last Year: Sometimes true    Ran Out of Food in the Last Year: Never true  Transportation Needs: No Transportation Needs (11/28/2022)   PRAPARE - Administrator, Civil Service (Medical): No    Lack of Transportation (Non-Medical): No  Physical Activity: Sufficiently Active (11/28/2022)   Exercise Vital Sign    Days of Exercise per Week:  7 days    Minutes of Exercise per Session: 50 min  Stress: No Stress Concern Present (11/28/2022)   [employer] of Occupational Health - Occupational Stress Questionnaire    Feeling of Stress : Only a little  Social Connections: Moderately Integrated (11/28/2022)   Social Connection and Isolation Panel [NHANES]    Frequency of Communication with Friends and Family: More than three times a week    Frequency of Social Gatherings with Friends and Family: More than three times a week    Attends Religious Services: More than 4 times per year    Active Member of [employer] or Organizations: Yes    Attends Banker Meetings: More than 4 times per year    Marital Status: Widowed  Intimate Partner Violence: Not At Risk (06/03/2022)   Humiliation, Afraid, Rape, and Kick questionnaire    Fear of Current or Ex-Partner: No    Emotionally Abused: No    Physically Abused: No    Sexually Abused: No   Family Status  Relation Name Status   Mother  Deceased   Father  Deceased   Sister  (Not Specified)   MGM  (Not Specified)  No partnership data on file   Family History  Problem Relation Age of Onset   Osteoporosis Mother    Breast cancer Mother    Heart disease Father    Heart attack Father 2       82   Hypertension Father    Stroke Sister    Hypertension  Sister    Diabetes Sister    Colon polyps Sister    Cancer Maternal Grandmother        Breast and Uterine Cancer   Breast cancer Maternal Grandmother    Thyroid cancer Maternal Grandmother    Allergies  Allergen Reactions   Atorvastatin    Rosuvastatin     40mg --Creatinine elevation      Review of Systems  Constitutional:  Negative for fever and malaise/fatigue.  HENT:  Negative for congestion.   Eyes:  Negative for blurred vision.  Respiratory:  Negative for cough and shortness of breath.   Cardiovascular:  Negative for chest pain, palpitations and leg swelling.  Gastrointestinal:  Negative for vomiting.  Musculoskeletal:  Negative for back pain.  Skin:  Negative for rash.  Neurological:  Negative for loss of consciousness and headaches.  Psychiatric/Behavioral:  Negative for depression. The patient is not nervous/anxious.       Objective:     BP 128/80 (BP Location: Left Arm, Patient Position: Sitting, Cuff Size: Normal)   Pulse 60   Temp 97.8 F (36.6 C) (Oral)   Resp 16   Ht 5' 3.75" (1.619 m)   Wt 184 lb 12.8 oz (83.8 kg)   SpO2 98%   BMI 31.97 kg/m  BP Readings from Last 3 Encounters:  05/30/23 128/80  03/23/23 (!) 104/58  03/01/23 132/84   Wt Readings from Last 3 Encounters:  05/30/23 184 lb 12.8 oz (83.8 kg)  03/23/23 183 lb (83 kg)  03/01/23 181 lb (82.1 kg)   SpO2 Readings from Last 3 Encounters:  05/30/23 98%  03/23/23 92%  03/01/23 (!) 72%      Physical Exam Vitals and nursing note reviewed.  Constitutional:      General: She is not in acute distress.    Appearance: Normal appearance. She is well-developed.  HENT:     Head: Normocephalic and atraumatic.  Eyes:     General: No scleral icterus.  Right eye: No discharge.        Left eye: No discharge.  Cardiovascular:     Rate and Rhythm: Normal rate and regular rhythm.     Heart sounds: No murmur heard. Pulmonary:     Effort: Pulmonary effort is normal. No respiratory distress.      Breath sounds: Normal breath sounds.  Musculoskeletal:        General: Normal range of motion.     Cervical back: Normal range of motion and neck supple.     Right lower leg: No edema.     Left lower leg: No edema.  Skin:    General: Skin is warm and dry.  Neurological:     Mental Status: She is alert and oriented to person, place, and time.  Psychiatric:        Mood and Affect: Mood normal.        Behavior: Behavior normal.        Thought Content: Thought content normal.        Judgment: Judgment normal.      No results found for any visits on 05/30/23.  Last CBC Lab Results  Component Value Date   WBC 5.8 04/28/2022   HGB 12.5 04/28/2022   HCT 39.1 04/28/2022   MCV 84 04/28/2022   MCH 26.8 04/28/2022   RDW 14.4 04/28/2022   PLT 276 04/28/2022   Last metabolic panel Lab Results  Component Value Date   GLUCOSE 94 11/29/2022   NA 141 11/29/2022   K 4.8 11/29/2022   CL 105 11/29/2022   CO2 24 11/29/2022   BUN 31 (H) 11/29/2022   CREATININE 1.78 (H) 11/29/2022   EGFR 30 (L) 11/29/2022   CALCIUM 9.5 11/29/2022   PHOS 4.0 08/05/2020   PROT 6.7 11/29/2022   ALBUMIN 4.4 11/29/2022   LABGLOB 2.3 11/29/2022   BILITOT 0.3 11/29/2022   ALKPHOS 100 11/29/2022   AST 15 11/29/2022   ALT 14 11/29/2022   ANIONGAP 11 12/27/2013   Last lipids Lab Results  Component Value Date   CHOL 153 11/29/2022   HDL 71 11/29/2022   LDLCALC 59 11/29/2022   TRIG 137 11/29/2022   CHOLHDL 2.2 11/29/2022   Last hemoglobin A1c Lab Results  Component Value Date   HGBA1C 5.9 08/27/2020   Last thyroid functions Lab Results  Component Value Date   TSH 1.47 03/01/2023   Last vitamin D Lab Results  Component Value Date   VD25OH 29 (L) 02/12/2019   Last vitamin B12 and Folate Lab Results  Component Value Date   VITAMINB12 728 08/27/2020   FOLATE 13.8 08/27/2020      The 10-year ASCVD risk score (Arnett DK, et al., 2019) is: 14.3%    Assessment & Plan:   Problem  List Items Addressed This Visit       Unprioritized   Vitamin B12 deficiency - Primary   Relevant Orders   Vitamin B12   Hyperlipidemia LDL goal <70   Relevant Orders   Comprehensive metabolic panel with GFR   Lipid panel   Essential hypertension, benign   Relevant Orders   Comprehensive metabolic panel with GFR   Lipid panel   Chronic kidney disease, stage 4 (severe) (HCC)   Relevant Orders   Comprehensive metabolic panel with GFR   Lipid panel   Other Visit Diagnoses       Recurrent major depressive disorder, in full remission (HCC)       Relevant Medications   citalopram (CELEXA) 20  MG tablet     Assessment and Plan Assessment & Plan Depression   She is currently taking Celexa for depression but finds it ineffective for deep sleep unless combined with acetaminophen, which she is hesitant to continue. She experiences occasional sadness, particularly on weekends, but generally feels okay. Occasional sadness was reassured as normal. She was advised to consider reducing caffeine intake, especially sweet tea, to improve sleep quality. Continue Celexa as prescribed, monitor mood and sleep patterns, and consider reducing caffeine intake.  Kidney Cancer Surveillance   She is under surveillance for kidney cancer with a urologist. A recent CT scan was normal, and she is scheduled to see her kidney specialist next month. The importance of regular follow-up and monitoring for any new symptoms or changes was emphasized. Continue regular follow-up with the kidney specialist and monitor for any new symptoms or changes.  Ovarian Cyst   She reports a sensation on the left side, assumed to be related to ovulation. She has an ovarian cyst and tube removal on the right side. An ultrasound showed a follicle on the left side and a fibroid, but the ovary was normal. The sensation was transient, lasting half a day, with no bleeding or cramping. She was advised to discuss this with her gynecologist,  Dr. Colvin Dec, during her next visit.  General Health Maintenance   She takes B12 and vitamin D supplements and engages in regular physical activity by walking her dog and participating in volunteer work and Bible study. The importance of exercise and reducing caffeine intake for better sleep was discussed. Continue B12 and vitamin D supplementation, encourage regular physical activity, and consider reducing caffeine intake for improved sleep.  Follow-up   She has multiple upcoming appointments with specialists, including her gynecologist and kidney specialist. She is planning a mission trip to [place], which she has not disclosed to her cardiologist due to previous advice against long-distance travel. She acknowledges her limits and plans to monitor her health status during the trip. Follow up with gynecologist Dr. Colvin Dec on June 11, follow up with the kidney specialist next month, and monitor health status during the mission trip to [place].    No follow-ups on file.    Mckenna Boruff R Lowne Chase, DO

## 2023-06-02 ENCOUNTER — Encounter: Payer: Self-pay | Admitting: Family Medicine

## 2023-06-12 ENCOUNTER — Encounter

## 2023-06-13 NOTE — Progress Notes (Unsigned)
 Cardiology Office Note:   Date:  06/13/2023  ID:  Kelsey Church, DOB [birth year], MRN [id number] PCP: Crecencio Dodge, Candida Chalk, DO  [facility] Providers Cardiologist:  Eilleen Grates, MD {  History of Present Illness:   Kelsey Church is a 73 y.o. female  for evaluation of CAD.   She had an abnormal POET (Plain Old Exercise Treadmill) and had cath with results below.  She is managed medically for CAD.   She had predominantly small vessel or distal vessel disease.  She had nonobstructive disease in proximal large vessel and mid large vessels.   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  She subsequently developed renal insufficiency with a creatinine peaking at 4.7.  It was thought that this could have been her Crestor  which reduced to 5 mg although she and her daughter-in-law think it was related to Keytruda  that was being used.  Her creatinine did subsequently come down to 3.3.  She has not had any further cardiac work-up but there were no apparent cardiac complications during her nephrectomy.   She presents for follow up.  Since I last saw her home sleep study suggested mild sleep apnea.   ***       She had some increased chest pain at the last visit but we chose to manage this medically trying to avoid a cath with her CKD.  I added Imdur  60 mg at the last visit.  She said she had a hard time with this feeling fatigued and having headaches but she ultimately did not tolerate it.  She thinks it probably helped her shortness of breath and chest discomfort somewhat.  She was able to walk her dogs a little bit more.  She still after 100 yards can have some discomfort.  She is not getting any resting chest pressure, neck or arm discomfort.  She does not have any resting shortness of breath, PND or orthopnea.  She has had no new palpitations, presyncope or syncope.    ROS: ***  Studies Reviewed:    EKG:       ***  Risk  Assessment/Calculations:   {Does this patient have ATRIAL FIBRILLATION?:[id number]} No BP recorded.  {Refresh Note OR Click here to enter BP  :1}***        Physical Exam:   VS:  There were no vitals taken for this visit.   Wt Readings from Last 3 Encounters:  05/30/23 184 lb 12.8 oz (83.8 kg)  03/23/23 183 lb (83 kg)  03/01/23 181 lb (82.1 kg)     GEN: Well nourished, well developed in no acute distress NECK: No JVD; No carotid bruits CARDIAC: ***RR, *** murmurs, rubs, gallops RESPIRATORY:  Clear to auscultation without rales, wheezing or rhonchi  ABDOMEN: Soft, non-tender, non-distended EXTREMITIES:  No edema; No deformity   ASSESSMENT AND PLAN:   CKD IIIa:   Creat was *** 1.88.  No change in therapy.    DYSLIPIDEMIA:     LDL was *** 53 with HDL of 56.  No change in therapy.     CAD:  ***  We did have a long discussion about this.  She really does not want to take more medicines.  She thinks things are doing OK and does not want more aggressive therapy.  She would know to call 911 if she has resting pain.  If she has worsening exertional angina she would also let me know and we can  consider med titration.  She wants to try to avoid cardiac cath.   SLEEP APNEA:  ***  PALPITATIONS:  ***         Follow up ***  Signed, Eilleen Grates, MD

## 2023-06-14 ENCOUNTER — Ambulatory Visit: Payer: Medicare Other | Attending: Cardiology | Admitting: Cardiology

## 2023-06-14 ENCOUNTER — Encounter: Payer: Self-pay | Admitting: Cardiology

## 2023-06-14 VITALS — BP 120/70 | HR 68 | Ht 66.0 in | Wt 181.6 lb

## 2023-06-14 DIAGNOSIS — I2510 Atherosclerotic heart disease of native coronary artery without angina pectoris: Secondary | ICD-10-CM | POA: Diagnosis not present

## 2023-06-14 NOTE — Patient Instructions (Signed)
 Follow-Up: At [facility], you and your health needs are our priority.  As part of our continuing mission to provide you with exceptional heart care, our providers are all part of one team.  This team includes your primary Cardiologist (physician) and Advanced Practice Providers or APPs (Physician Assistants and Nurse Practitioners) who all work together to provide you with the care you need, when you need it.  Your next appointment:   1 year(s)  Provider:   Eilleen Grates, MD

## 2023-06-22 ENCOUNTER — Encounter: Payer: Self-pay | Admitting: Family Medicine

## 2023-07-17 DIAGNOSIS — N184 Chronic kidney disease, stage 4 (severe): Secondary | ICD-10-CM | POA: Diagnosis not present

## 2023-07-24 DIAGNOSIS — I2510 Atherosclerotic heart disease of native coronary artery without angina pectoris: Secondary | ICD-10-CM | POA: Diagnosis not present

## 2023-07-24 DIAGNOSIS — I129 Hypertensive chronic kidney disease with stage 1 through stage 4 chronic kidney disease, or unspecified chronic kidney disease: Secondary | ICD-10-CM | POA: Diagnosis not present

## 2023-07-24 DIAGNOSIS — Z905 Acquired absence of kidney: Secondary | ICD-10-CM | POA: Diagnosis not present

## 2023-07-24 DIAGNOSIS — C642 Malignant neoplasm of left kidney, except renal pelvis: Secondary | ICD-10-CM | POA: Diagnosis not present

## 2023-07-24 DIAGNOSIS — D631 Anemia in chronic kidney disease: Secondary | ICD-10-CM | POA: Diagnosis not present

## 2023-07-24 DIAGNOSIS — N2581 Secondary hyperparathyroidism of renal origin: Secondary | ICD-10-CM | POA: Diagnosis not present

## 2023-07-24 DIAGNOSIS — N184 Chronic kidney disease, stage 4 (severe): Secondary | ICD-10-CM | POA: Diagnosis not present

## 2023-07-25 NOTE — Progress Notes (Unsigned)
 73 y.o. G66P1001 Widowed Caucasian female here for a breast and pelvic exam.    The patient is also followed for ***.  PCP: Crecencio Dodge, Candida Chalk, DO   No LMP recorded. Patient is postmenopausal.           Sexually active: No.  The current method of family planning is post menopausal status.    Menopausal hormone therapy:  n/a Exercising: {yes no:314532}  {types:19826} Smoker:  no  OB History     Gravida  2   Para  1   Term  1   Preterm  0   AB  0   Living  1      SAB      IAB      Ectopic  0   Multiple      Live Births  1           HEALTH MAINTENANCE: Last 2 paps: 06/15/21 neg, 01/20/17 neg  History of abnormal Pap or positive HPV:  no Mammogram:  07/28/22 Breast Density Cat A, BIRADS Cat 1 neg  Colonoscopy:  06/03/20 Bone Density:  07/14/21  Result  normal   Immunization History  Administered Date(s) Administered   Fluad Quad(high Dose 65+) 12/26/2018, 12/07/2020, 12/02/2021   Fluad Trivalent(High Dose 65+) 12/02/2022   Hepatitis A 08/07/2008, 07/28/2010   Hepatitis A, Adult 08/07/2008, 07/28/2010   Hepatitis B 10/15/1984, 01/14/1985, 07/15/1996, 08/24/2009   Hepatitis B, ADULT 10/15/1984, 01/14/1985, 07/15/1996, 08/24/2009   Influenza Nasal 12/16/2011   Influenza Whole 10/16/2010   Influenza, High Dose Seasonal PF 11/27/2017   Influenza,inj,Quad PF,6+ Mos 12/03/2013, 12/10/2014   Influenza-Unspecified 10/15/2016   MMR 08/13/2008   OPV 08/12/1958   PFIZER(Purple Top)SARS-COV-2 Vaccination 03/22/2019, 04/16/2019, 02/13/2020   Pfizer Covid-19 Vaccine Bivalent Booster 27yrs & up 02/16/2021   Pneumococcal Conjugate-13 03/29/2016   Pneumococcal Polysaccharide-23 04/17/2017   Td 07/04/2006, 01/16/2023   Td (Adult), 2 Lf Tetanus Toxid, Preservative Free 07/04/2006   Tdap 05/16/2013   Typhoid Inactivated 07/18/2012   Yellow Fever 07/18/2012   Zoster, Live 09/06/2012      reports that she has never smoked. She has never been exposed to tobacco  smoke. She has never used smokeless tobacco. She reports that she does not drink alcohol and does not use drugs.  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/10/13   Cancer (HCC)    melanoma in 2 sights   Complication of anesthesia    "chills every evening at sundown x 2 weeks"- no fever   Depression    Dysphagia    History of kidney cancer    left   History of shingles    NO RESIDUAL PROBLEMS   Hypertension    Pain    LOWER BACK PAIN   Sleep apnea    CPAP    Past Surgical History:  Procedure Laterality Date   APPENDECTOMY  1958   COLONOSCOPY     COLONOSCOPY W/ POLYPECTOMY     7 polyps   ESOPHAGEAL MANOMETRY N/A 03/17/2021   Procedure: ESOPHAGEAL MANOMETRY (EM);  Surgeon: Nannette Babe, MD;  Location: [facility];  Service: Gastroenterology;  Laterality: N/A;   FOOT SURGERY Right    removal plantar wart   GALLBLADDER SURGERY  02/14/1990   INJECTION KNEE  09/2011   right    KNEE ARTHROSCOPY  1996   LEFT HEART CATH AND CORONARY ANGIOGRAPHY N/A 07/11/2019   Procedure:  LEFT HEART CATH AND CORONARY ANGIOGRAPHY;  Surgeon: Arty Binning, MD;  Location[facility] INVASIVE CV LAB;  Service: Cardiovascular;  Laterality: N/A;   MELANOMA EXCISION  1992   right chin, back   MOHS SURGERY     nose   NEPHRECTOMY Left 2022   POLYPECTOMY     SALPINGOOPHORECTOMY  2009   Right   TOTAL KNEE ARTHROPLASTY Right 06/10/2013   Procedure: RIGHT TOTAL KNEE ARTHROPLASTY;  Surgeon: Aurther Blue, MD;  Location: [facility];  Service: Orthopedics;  Laterality: Right;   TOTAL KNEE ARTHROPLASTY Left 12/16/2013   Procedure: LEFT TOTAL KNEE ARTHROPLASTY;  Surgeon: Aurther Blue, MD;  Location: [facility];  Service: Orthopedics;  Laterality: Left;    Current Outpatient Medications  Medication Sig Dispense Refill   acetaminophen  (TYLENOL ) 650 MG CR tablet Take 1,300 mg by mouth every 8 (eight) hours as needed for pain.      aspirin  EC 81 MG  tablet Take 1 tablet (81 mg total) by mouth daily. 90 tablet 3   cholecalciferol (VITAMIN D3) 25 MCG (1000 UNIT) tablet Take 1,000 Units by mouth daily.     citalopram  (CELEXA ) 20 MG tablet 1 1/2 tab po qd 135 tablet 3   docusate sodium  (COLACE) 100 MG capsule Take by mouth. 1 capsule daily sometimes twice a day     Evolocumab  (REPATHA  SURECLICK) 140 MG/ML SOAJ INJECT INTO THE SKIN EVERY 14 DAYS 6 mL 3   isosorbide  mononitrate (IMDUR ) 60 MG 24 hr tablet Take 1 tablet (60 mg total) by mouth daily. 10 tablet 0   metoprolol  succinate (TOPROL -XL) 25 MG 24 hr tablet TAKE 2 TABLETS IN THE MORNING AND 1 TABLET EVERY  EVENING 270 tablet 3   nitroGLYCERIN  (NITROSTAT ) 0.4 MG SL tablet Place 1 tablet (0.4 mg total) under the tongue every 5 (five) minutes as needed for chest pain. (Patient not taking: Reported on 06/08/2022) 25 tablet 3   ranolazine  (RANEXA ) 500 MG 12 hr tablet TAKE 1 TABLET TWICE A DAY 180 tablet 3   vitamin B-12 (CYANOCOBALAMIN ) 500 MCG tablet Take 500 mcg by mouth daily.     No current facility-administered medications for this visit.    ALLERGIES: Atorvastatin  and Rosuvastatin   Family History  Problem Relation Age of Onset   Osteoporosis Mother    Breast cancer Mother    Heart disease Father    Heart attack Father 67       8   Hypertension Father    Stroke Sister    Hypertension Sister    Diabetes Sister    Colon polyps Sister    Cancer Maternal Grandmother        Breast and Uterine Cancer   Breast cancer Maternal Grandmother    Thyroid  cancer Maternal Grandmother     Review of Systems  PHYSICAL EXAM:  There were no vitals taken for this visit.    General appearance: alert, cooperative and appears stated age Head: normocephalic, without obvious abnormality, atraumatic Neck: no adenopathy, supple, symmetrical, trachea midline and thyroid  normal to inspection and palpation Lungs: clear to auscultation bilaterally Breasts: normal appearance, no masses or  tenderness, No nipple retraction or dimpling, No nipple discharge or bleeding, No axillary adenopathy Heart: regular rate and rhythm Abdomen: soft, non-tender; no masses, no organomegaly Extremities: extremities normal, atraumatic, no cyanosis or edema Skin: skin color, texture, turgor normal. No rashes or lesions Lymph nodes: cervical, supraclavicular, and axillary nodes normal. Neurologic: grossly normal  Pelvic: External genitalia:  no lesions  No abnormal inguinal nodes palpated.              Urethra:  normal appearing urethra with no masses, tenderness or lesions              Bartholins and Skenes: normal                 Vagina: normal appearing vagina with normal color and discharge, no lesions              Cervix: no lesions              Pap taken: {yes no:314532} Bimanual Exam:  Uterus:  normal size, contour, position, consistency, mobility, non-tender              Adnexa: no mass, fullness, tenderness              Rectal exam: {yes no:314532}.  Confirms.              Anus:  normal sphincter tone, no lesions  Chaperone was present for exam:  {BSCHAPERONE:31226::"Emily F, CMA"}  ASSESSMENT: Encounter for breast and pelvic exam.   ***  PLAN: Mammogram screening discussed. Self breast awareness reviewed. Pap and HRV collected:  {yes no:314532} Guidelines for Calcium , Vitamin D , regular exercise program including cardiovascular and weight bearing exercise. Medication refills:  *** {LABS (Optional):23779} Follow up:  ***    Additional counseling given.  {yes [id number]. ***  total time was spent for this patient encounter, including preparation, face-to-face counseling with the patient, coordination of care, and documentation of the encounter in addition to doing the breast and pelvic exam.

## 2023-07-26 ENCOUNTER — Other Ambulatory Visit (HOSPITAL_COMMUNITY)
Admission: RE | Admit: 2023-07-26 | Discharge: 2023-07-26 | Disposition: A | Discharge status: Home / Self Care | Source: Ambulatory Visit | Attending: Obstetrics and Gynecology | Admitting: Obstetrics and Gynecology

## 2023-07-26 ENCOUNTER — Ambulatory Visit (INDEPENDENT_AMBULATORY_CARE_PROVIDER_SITE_OTHER): Payer: Medicare Other | Admitting: Obstetrics and Gynecology

## 2023-07-26 ENCOUNTER — Encounter: Payer: Self-pay | Admitting: Obstetrics and Gynecology

## 2023-07-26 VITALS — BP 116/78 | HR 60 | Ht 64.75 in | Wt 178.2 lb

## 2023-07-26 DIAGNOSIS — Z01419 Encounter for gynecological examination (general) (routine) without abnormal findings: Secondary | ICD-10-CM

## 2023-07-26 DIAGNOSIS — Z124 Encounter for screening for malignant neoplasm of cervix: Secondary | ICD-10-CM | POA: Insufficient documentation

## 2023-07-26 NOTE — Patient Instructions (Signed)

## 2023-07-30 ENCOUNTER — Ambulatory Visit: Payer: Self-pay | Admitting: Obstetrics and Gynecology

## 2023-07-31 ENCOUNTER — Ambulatory Visit: Payer: BLUE CROSS/BLUE SHIELD

## 2023-08-14 ENCOUNTER — Ambulatory Visit (HOSPITAL_BASED_OUTPATIENT_CLINIC_OR_DEPARTMENT_OTHER): Admitting: Pulmonary Disease

## 2023-08-15 ENCOUNTER — Ambulatory Visit
Admission: RE | Admit: 2023-08-15 | Discharge: 2023-08-15 | Disposition: A | Discharge status: Home / Self Care | Source: Ambulatory Visit | Attending: Obstetrics and Gynecology | Admitting: Obstetrics and Gynecology

## 2023-08-15 DIAGNOSIS — Z1231 Encounter for screening mammogram for malignant neoplasm of breast: Secondary | ICD-10-CM | POA: Diagnosis not present

## 2023-08-17 ENCOUNTER — Ambulatory Visit: Payer: Self-pay | Admitting: Obstetrics and Gynecology

## 2023-09-06 ENCOUNTER — Other Ambulatory Visit: Payer: Self-pay | Admitting: Cardiology

## 2023-10-05 DIAGNOSIS — D1801 Hemangioma of skin and subcutaneous tissue: Secondary | ICD-10-CM | POA: Diagnosis not present

## 2023-10-05 DIAGNOSIS — L570 Actinic keratosis: Secondary | ICD-10-CM | POA: Diagnosis not present

## 2023-10-05 DIAGNOSIS — D692 Other nonthrombocytopenic purpura: Secondary | ICD-10-CM | POA: Diagnosis not present

## 2023-10-05 DIAGNOSIS — L814 Other melanin hyperpigmentation: Secondary | ICD-10-CM | POA: Diagnosis not present

## 2023-10-05 DIAGNOSIS — D225 Melanocytic nevi of trunk: Secondary | ICD-10-CM | POA: Diagnosis not present

## 2023-10-05 DIAGNOSIS — Z85828 Personal history of other malignant neoplasm of skin: Secondary | ICD-10-CM | POA: Diagnosis not present

## 2023-10-05 DIAGNOSIS — L821 Other seborrheic keratosis: Secondary | ICD-10-CM | POA: Diagnosis not present

## 2023-10-17 DIAGNOSIS — H35361 Drusen (degenerative) of macula, right eye: Secondary | ICD-10-CM | POA: Diagnosis not present

## 2023-10-17 DIAGNOSIS — H04123 Dry eye syndrome of bilateral lacrimal glands: Secondary | ICD-10-CM | POA: Diagnosis not present

## 2023-10-17 DIAGNOSIS — H25813 Combined forms of age-related cataract, bilateral: Secondary | ICD-10-CM | POA: Diagnosis not present

## 2023-10-19 ENCOUNTER — Other Ambulatory Visit: Payer: Self-pay | Admitting: Urology

## 2023-10-19 DIAGNOSIS — Z85528 Personal history of other malignant neoplasm of kidney: Secondary | ICD-10-CM

## 2023-10-20 ENCOUNTER — Encounter: Payer: Self-pay | Admitting: Family Medicine

## 2023-10-20 ENCOUNTER — Ambulatory Visit (INDEPENDENT_AMBULATORY_CARE_PROVIDER_SITE_OTHER): Admitting: Family Medicine

## 2023-10-20 VITALS — BP 122/68 | HR 81 | Temp 97.1°F | Ht 64.75 in | Wt 183.6 lb

## 2023-10-20 DIAGNOSIS — G529 Cranial nerve disorder, unspecified: Secondary | ICD-10-CM | POA: Insufficient documentation

## 2023-10-20 MED ORDER — LIDOCAINE 5 % EX OINT: 1.0000 | TOPICAL_OINTMENT | Freq: Three times a day (TID) | CUTANEOUS | 0 refills | Status: DC | PRN

## 2023-10-20 MED ORDER — PREDNISONE 20 MG PO TABS: 20.0000 mg | ORAL_TABLET | Freq: Every day | ORAL | 0 refills | Status: DC

## 2023-10-20 NOTE — Assessment & Plan Note (Signed)
 Ms. Paro appears to have irritated a superficial sensory nerve of the scalp with pressure from a  metal visor band.  She only has one kidney, so notes a need to avoid NSAIDs. I will treat her with a 5-day course of prednisone  and provide some topical lidocaine  to relieve her symptoms.

## 2023-10-20 NOTE — Progress Notes (Signed)
 [facility] [address] [place] [address] Dept: [phone] Dept Fax: [phone]  Office Visit  Subjective:    Patient ID: Kelsey Church, female    DOB: 1950-12-16, 73 y.o..   MRN: [id number]  Chief Complaint  Patient presents with   Ear Pain    C/o having pain behind ear/scalp x 1 week.     History of Present Illness:  Patient is in today complaining of pain in her scalp behind her left ear. She notes this weekend, she was wearing a [place] Jack visor. She describes this as an older style that has a metal band. She notes that she wore this for several hours and did not some pressure along the scalp under the band, that she tried to relieve periodically. By the next day, she noted pain in this area of the scalp. The pain can feel likes sudden jolts of pain. There has been some pins and needles quality to this. She has tried popping her ears, but this did not help. She was worried this might be a return of shingles that she had in this area at one point, but has seen no rash. She had tried taking Benadryl , but this has not helped.  Past Medical History: Patient Active Problem List   Diagnosis Date Noted   Hot flashes 02/02/2023   Dog bite of abdomen 02/02/2023   Need for influenza vaccination 12/02/2022   Chronic kidney disease, stage 4 (severe) (HCC) 06/02/2022   COVID-19 07/27/2021   Hallucinations 05/06/2021   Viral URI 01/20/2021   Dysphagia    Hyperlipidemia LDL goal <70 12/07/2020   Pellagra-like syndrome (HCC) 08/27/2020   Atherosclerotic heart disease of native coronary artery with other forms of angina pectoris (HCC)    Symptomatic PVCs 12/26/2018   Combined abdominal and pelvic pain 08/28/2017   Degenerative lumbar disc 11/01/2016   Gastroesophageal reflux disease with esophagitis 12/10/2014   Moderate osteopenia 12/10/2014   Vitamin B12 deficiency 12/10/2014   OA (osteoarthritis) of knee 12/16/2013    Prediabetes 12/03/2013   Other screening mammogram 09/25/2012   DJD (degenerative joint disease) of knee 09/09/2011   OSA (obstructive sleep apnea) 07/29/2011   Essential hypertension, benign 07/29/2011   Past Surgical History:  Procedure Laterality Date   APPENDECTOMY  1958   CHOLECYSTECTOMY  1992   COLONOSCOPY     COLONOSCOPY W/ POLYPECTOMY     7 polyps   ESOPHAGEAL MANOMETRY N/A 03/17/2021   Procedure: ESOPHAGEAL MANOMETRY (EM);  Surgeon: Albertus Gordy HERO, MD;  Location: [facility];  Service: Gastroenterology;  Laterality: N/A;   FOOT SURGERY Right    removal plantar wart   GALLBLADDER SURGERY  02/14/1990   INJECTION KNEE  09/2011   right    JOINT REPLACEMENT  2015   Right. Left knee   KNEE ARTHROSCOPY  1996   LEFT HEART CATH AND CORONARY ANGIOGRAPHY N/A 07/11/2019   Procedure: LEFT HEART CATH AND CORONARY ANGIOGRAPHY;  Surgeon: Claudene Victory ORN, MD;  Location: [facility] INVASIVE CV LAB;  Service: Cardiovascular;  Laterality: N/A;   MELANOMA EXCISION  1992   right chin, back   MOHS SURGERY     nose   NEPHRECTOMY Left 2022   POLYPECTOMY     SALPINGOOPHORECTOMY  2009   Right   TOTAL KNEE ARTHROPLASTY Right 06/10/2013   Procedure: RIGHT TOTAL KNEE ARTHROPLASTY;  Surgeon: Dempsey LULLA Moan, MD;  Location: [facility];  Service: Orthopedics;  Laterality: Right;   TOTAL KNEE ARTHROPLASTY Left  12/16/2013   Procedure: LEFT TOTAL KNEE ARTHROPLASTY;  Surgeon: Dempsey Melodi GAILS, MD;  Location: [facility];  Service: Orthopedics;  Laterality: Left;   Family History  Problem Relation Age of Onset   Osteoporosis Mother    Breast cancer Mother        patient states mother has mastectomy due to fibrocystic breasts and MGM history of breast cancer   Heart disease Father    Heart attack Father 76       17   Hypertension Father    Stroke Sister    Hypertension Sister    Diabetes Sister    Colon polyps Sister    Kidney disease Sister    Vision loss Sister    Breast cancer Maternal Grandmother 77 - 69    Thyroid  cancer Maternal Grandmother    Uterine cancer Maternal Grandmother    Cancer Maternal Grandmother    Outpatient Medications Prior to Visit  Medication Sig Dispense Refill   acetaminophen  (TYLENOL ) 650 MG CR tablet Take 1,300 mg by mouth every 8 (eight) hours as needed for pain.      aspirin  EC 81 MG tablet Take 1 tablet (81 mg total) by mouth daily. 90 tablet 3   cholecalciferol (VITAMIN D3) 25 MCG (1000 UNIT) tablet Take 1,000 Units by mouth daily.     citalopram  (CELEXA ) 20 MG tablet 1 1/2 tab po qd 135 tablet 3   docusate sodium  (COLACE) 100 MG capsule Take by mouth. 1 capsule daily sometimes twice a day     Evolocumab  (REPATHA  SURECLICK) 140 MG/ML SOAJ INJECT INTO THE SKIN EVERY 14 DAYS 6 mL 3   isosorbide  mononitrate (IMDUR ) 60 MG 24 hr tablet TAKE 1 TABLET DAILY 90 tablet 3   metoprolol  succinate (TOPROL -XL) 25 MG 24 hr tablet TAKE 2 TABLETS IN THE MORNING AND 1 TABLET EVERY  EVENING 270 tablet 3   nitroGLYCERIN  (NITROSTAT ) 0.4 MG SL tablet Place 1 tablet (0.4 mg total) under the tongue every 5 (five) minutes as needed for chest pain. 25 tablet 3   ranolazine  (RANEXA ) 500 MG 12 hr tablet TAKE 1 TABLET TWICE A DAY 180 tablet 3   vitamin B-12 (CYANOCOBALAMIN ) 500 MCG tablet Take 500 mcg by mouth daily.     No facility-administered medications prior to visit.   Allergies  Allergen Reactions   Atorvastatin     Rosuvastatin      40mg --Creatinine elevation     Objective:   Today's Vitals   10/20/23 1356  BP: 122/68  Pulse: 81  Temp: (!) 97.1 F (36.2 C)  TempSrc: Temporal  SpO2: 97%  Weight: 183 lb 9.6 oz (83.3 kg)  Height: 5' 4.75 (1.645 m)   Body mass index is 30.79 kg/m.   General: Well developed, well nourished. No acute distress. HEENT: Normocephalic, non-traumatic. Scalp appears normal. No rash noted. Left external ear   normal. EAC and TM normal as well.  Psych: Alert and oriented. Normal mood and affect.  Health Maintenance Due  Topic Date Due    Zoster Vaccines- Shingrix (1 of 2) 08/11/1969   Medicare Annual Wellness (AWV)  06/03/2023   DEXA SCAN  07/15/2023     Assessment & Plan:   Problem List Items Addressed This Visit       Nervous and Auditory   Cranial neuritis - Primary   Kelsey Church appears to have irritated a superficial sensory nerve of the scalp with pressure from a  metal visor band.  She only has one kidney, so notes a  need to avoid NSAIDs. I will treat her with a 5-day course of prednisone  and provide some topical lidocaine  to relieve her symptoms.      Relevant Medications   lidocaine  (XYLOCAINE ) 5 % ointment   predniSONE  (DELTASONE ) 20 MG tablet    Return if symptoms worsen or fail to improve.   Garnette CHRISTELLA Simpler, MD

## 2023-10-24 ENCOUNTER — Ambulatory Visit

## 2023-10-25 ENCOUNTER — Ambulatory Visit (INDEPENDENT_AMBULATORY_CARE_PROVIDER_SITE_OTHER)

## 2023-10-25 VITALS — BP 122/68 | Ht 64.75 in | Wt 185.0 lb

## 2023-10-25 DIAGNOSIS — Z0000 Encounter for general adult medical examination without abnormal findings: Secondary | ICD-10-CM | POA: Diagnosis not present

## 2023-10-25 NOTE — Progress Notes (Signed)
 Because this visit was a virtual/telehealth visit,  certain criteria was not obtained, such a blood pressure, CBG if applicable, and timed get up and go. Any medications not marked as taking were not mentioned during the medication reconciliation part of the visit. Any vitals not documented were not able to be obtained due to this being a telehealth visit or patient was unable to self-report a recent blood pressure reading due to a lack of equipment at home via telehealth. Vitals that have been documented are verbally provided by the patient.  This visit was performed by a medical professional under my direct supervision. I was immediately available for consultation/collaboration. I have reviewed and agree with the Annual Wellness Visit documentation.  Subjective:   GENELLA BAS is a 73 y.o. who presents for a Medicare Wellness preventive visit.  As a reminder, Annual Wellness Visits don't include a physical exam, and some assessments may be limited, especially if this visit is performed virtually. We may recommend an in-person follow-up visit with your provider if needed.  Visit Complete: Virtual I connected with  Shevonne H Neumeyer on 10/25/23 by a audio enabled telemedicine application and verified that I am speaking with the correct person using two identifiers.  Patient Location: Home  Provider Location: Home Office  I discussed the limitations of evaluation and management by telemedicine. The patient expressed understanding and agreed to proceed.  Vital Signs: Because this visit was a virtual/telehealth visit, some criteria may be missing or patient reported. Any vitals not documented were not able to be obtained and vitals that have been documented are patient reported.  VideoDeclined- This patient declined Librarian, academic. Therefore the visit was completed with audio only.  Persons Participating in Visit: Patient.  AWV Questionnaire: Yes: Patient  Medicare AWV questionnaire was completed by the patient on 10/18/2023; I have confirmed that all information answered by patient is correct and no changes since this date.  Cardiac Risk Factors include: advanced age (>55men, >37 women);obesity (BMI >30kg/m2);hypertension;dyslipidemia     Objective:    Today's Vitals   10/25/23 0939  BP: 122/68  Weight: 185 lb (83.9 kg)  Height: 5' 4.75 (1.645 m)   Body mass index is 31.02 kg/m.     10/25/2023    9:38 AM 06/03/2022   10:19 AM 12/06/2021   10:22 AM 06/09/2021    8:06 PM 05/31/2021    8:35 AM 09/01/2020   10:19 AM 07/11/2019    6:34 AM  Advanced Directives  Does Patient Have a Medical Advance Directive? Yes Yes Yes No No No No  Type of Estate agent of [place];Living will Healthcare Power of [place];Living will Healthcare Power of [place];Living will      Does patient want to make changes to medical advance directive? No - Patient declined  No - Patient declined      Copy of Healthcare Power of Attorney in Chart? No - copy requested No - copy requested No - copy requested      Would patient like information on creating a medical advance directive?    No - Patient declined  Yes (MAU/Ambulatory/Procedural Areas - Information given) No - Patient declined    Current Medications (verified) Outpatient Encounter Medications as of 10/25/2023  Medication Sig   acetaminophen  (TYLENOL ) 650 MG CR tablet Take 1,300 mg by mouth every 8 (eight) hours as needed for pain.    aspirin  EC 81 MG tablet Take 1 tablet (81 mg total) by mouth daily.   cholecalciferol (  VITAMIN D3) 25 MCG (1000 UNIT) tablet Take 1,000 Units by mouth daily.   citalopram  (CELEXA ) 20 MG tablet 1 1/2 tab po qd   docusate sodium  (COLACE) 100 MG capsule Take by mouth. 1 capsule daily sometimes twice a day   Evolocumab  (REPATHA  SURECLICK) 140 MG/ML SOAJ INJECT INTO THE SKIN EVERY 14 DAYS   isosorbide  mononitrate (IMDUR ) 60 MG 24 hr tablet TAKE 1 TABLET  DAILY   lidocaine  (XYLOCAINE ) 5 % ointment Apply 1 Application topically 3 (three) times daily as needed for moderate pain (pain score 4-6).   metoprolol  succinate (TOPROL -XL) 25 MG 24 hr tablet TAKE 2 TABLETS IN THE MORNING AND 1 TABLET EVERY  EVENING   nitroGLYCERIN  (NITROSTAT ) 0.4 MG SL tablet Place 1 tablet (0.4 mg total) under the tongue every 5 (five) minutes as needed for chest pain.   predniSONE  (DELTASONE ) 20 MG tablet Take 1 tablet (20 mg total) by mouth daily with breakfast.   ranolazine  (RANEXA ) 500 MG 12 hr tablet TAKE 1 TABLET TWICE A DAY   vitamin B-12 (CYANOCOBALAMIN ) 500 MCG tablet Take 500 mcg by mouth daily.   No facility-administered encounter medications on file as of 10/25/2023.    Allergies (verified) Atorvastatin  and Rosuvastatin    History: Past Medical History:  Diagnosis Date   Allergy November 29, 2019   Statin medicine   Arthritis 2000   PAIN AND OA LEFT KNEE; S/P RIGHT TOTAL KNEE ARTHROPLASTY 06/10/13   Cancer (HCC) 1992   melanoma in 2 sights   Chronic kidney disease 11/28/2020   Complication of anesthesia    chills every evening at sundown x 2 weeks- no fever   Depression 11-29-10   Husband death   Dysphagia    Elevated serum creatinine    History of kidney cancer    left   History of shingles    NO RESIDUAL PROBLEMS   Hyperlipidemia Nov 29, 2019   Hypertension    Pain    LOWER BACK PAIN   Sleep apnea Nov 29, 2010   CPAP   Past Surgical History:  Procedure Laterality Date   APPENDECTOMY  1958   CHOLECYSTECTOMY  1992   COLONOSCOPY     COLONOSCOPY W/ POLYPECTOMY     7 polyps   ESOPHAGEAL MANOMETRY N/A 03/17/2021   Procedure: ESOPHAGEAL MANOMETRY (EM);  Surgeon: Albertus Gordy HERO, MD;  Location: [facility];  Service: Gastroenterology;  Laterality: N/A;   FOOT SURGERY Right    removal plantar wart   GALLBLADDER SURGERY  02/14/1990   INJECTION KNEE  09/2011   right    JOINT REPLACEMENT  2015   Right. Left knee   KNEE ARTHROSCOPY  1996   LEFT HEART CATH AND CORONARY  ANGIOGRAPHY N/A 07/11/2019   Procedure: LEFT HEART CATH AND CORONARY ANGIOGRAPHY;  Surgeon: Claudene Victory ORN, MD;  Location: [facility];  Service: Cardiovascular;  Laterality: N/A;   MELANOMA EXCISION  1992   right chin, back   MOHS SURGERY     nose   NEPHRECTOMY Left 2020/11/28   POLYPECTOMY     SALPINGOOPHORECTOMY  Nov 29, 2007   Right   TOTAL KNEE ARTHROPLASTY Right 06/10/2013   Procedure: RIGHT TOTAL KNEE ARTHROPLASTY;  Surgeon: Dempsey LULLA Moan, MD;  Location: [facility];  Service: Orthopedics;  Laterality: Right;   TOTAL KNEE ARTHROPLASTY Left 12/16/2013   Procedure: LEFT TOTAL KNEE ARTHROPLASTY;  Surgeon: Dempsey Moan LULLA, MD;  Location: [facility];  Service: Orthopedics;  Laterality: Left;   Family History  Problem Relation Age of Onset   Osteoporosis  Mother    Breast cancer Mother        patient states mother has mastectomy due to fibrocystic breasts and MGM history of breast cancer   Heart disease Father    Heart attack Father 36       1   Hypertension Father    Stroke Sister    Hypertension Sister    Diabetes Sister    Colon polyps Sister    Kidney disease Sister    Vision loss Sister    Breast cancer Maternal Grandmother 30 - 69   Thyroid  cancer Maternal Grandmother    Uterine cancer Maternal Grandmother    Cancer Maternal Grandmother    Social History   Socioeconomic History   Marital status: Widowed    Spouse name: Not on file   Number of children: 1   Years of education: Not on file   Highest education level: Associate degree: occupational, Scientist, product/process development, or vocational program  Occupational History   Occupation: CMA    Employer: [employer]  Tobacco Use   Smoking status: Never    Passive exposure: Never   Smokeless tobacco: Never  Vaping Use   Vaping status: Never Used  Substance and Sexual Activity   Alcohol use: No    Alcohol/week: 0.0 standard drinks of alcohol   Drug use: No   Sexual activity: Not Currently    Partners: Male    Birth control/protection:  Post-menopausal    Comment: 1st intercourse- 18, partners- 1,   Other Topics Concern   Not on file  Social History Narrative   Lives alone, with dog.     Widow.  One son.    Caffienated drinks-no   Seat belt use often-yes   Regular Exercise-yes   Smoke alarm in the home-yes   Firearms/guns in the home-no   History of physical abuse-no            Social Drivers of Health   Financial Resource Strain: Low Risk  (10/18/2023)   Overall Financial Resource Strain (CARDIA)    Difficulty of Paying Living Expenses: Not very hard  Food Insecurity: No Food Insecurity (10/18/2023)   Hunger Vital Sign    Worried About Running Out of Food in the Last Year: Never true    Ran Out of Food in the Last Year: Never true  Transportation Needs: No Transportation Needs (10/18/2023)   PRAPARE - Administrator, Civil Service (Medical): No    Lack of Transportation (Non-Medical): No  Physical Activity: Sufficiently Active (10/18/2023)   Exercise Vital Sign    Days of Exercise per Week: 7 days    Minutes of Exercise per Session: 40 min  Stress: No Stress Concern Present (10/18/2023)   [employer] of Occupational Health - Occupational Stress Questionnaire    Feeling of Stress: Only a little  Social Connections: Moderately Integrated (10/18/2023)   Social Connection and Isolation Panel    Frequency of Communication with Friends and Family: More than three times a week    Frequency of Social Gatherings with Friends and Family: Three times a week    Attends Religious Services: More than 4 times per year    Active Member of Clubs or Organizations: Yes    Attends Banker Meetings: More than 4 times per year    Marital Status: Widowed    Tobacco Counseling Counseling given: Not Answered    Clinical Intake:  Pre-visit preparation completed: Yes  Pain : No/denies pain     BMI -  recorded: 31.02 Nutritional Status: BMI > 30  Obese Nutritional Risks: None Diabetes: No  Lab  Results  Component Value Date   HGBA1C 5.9 08/27/2020   HGBA1C 6.1 12/26/2018   HGBA1C 6.1 04/17/2017     How often do you need to have someone help you when you read instructions, pamphlets, or other written materials from your doctor or pharmacy?: 1 - Never  Interpreter Needed?: No  Information entered by :: Rushil Kimbrell,CMA   Activities of Daily Living     10/18/2023    1:45 PM  In your present state of health, do you have any difficulty performing the following activities:  Hearing? 0  Vision? 0  Difficulty concentrating or making decisions? 0  Walking or climbing stairs? 1  Dressing or bathing? 0  Doing errands, shopping? 0  Preparing Food and eating ? N  Using the Toilet? N  In the past six months, have you accidently leaked urine? N  Do you have problems with loss of bowel control? N  Managing your Medications? N  Managing your Finances? N  Housekeeping or managing your Housekeeping? N    Patient Care Team: Antonio Meth, Jamee SAUNDERS, DO as PCP - General (Family Medicine) Lavona Agent, MD as PCP - Cardiology (Cardiology) Jerrye Katheryn BROCKS, MD as Consulting Physician (Nephrology) Rosalind Zachary NOVAK, MD (Inactive) as Consulting Physician (Urology) Jude Harden GAILS, MD as Consulting Physician (Pulmonary Disease) Cathlyn BROCKS Nikki Bobie FORBES, MD as Consulting Physician (Obstetrics and Gynecology)  I have updated your Care Teams any recent Medical Services you may have received from other providers in the past year.     Assessment:   This is a routine wellness examination for Donald.  Hearing/Vision screen Hearing Screening - Comments:: Patient has no hearing difficulties  Vision Screening - Comments:: Patient wears glasses    Goals Addressed             This Visit's Progress    Patient Stated   On track    Increase exercise & drink more water .   Wants to lose 10 pounds       Depression Screen     10/25/2023    9:42 AM 01/16/2023    3:21 PM 12/02/2022    10:08 AM 06/03/2022   10:30 AM 06/02/2022    1:34 PM 05/31/2021    8:40 AM 05/04/2021    5:35 PM  PHQ 2/9 Scores  PHQ - 2 Score 2 0 0 1 0 0 1  PHQ- 9 Score 4 0         Fall Risk     10/18/2023    1:45 PM 01/16/2023    3:21 PM 12/02/2022   10:08 AM 06/03/2022   10:16 AM 06/02/2022    1:33 PM  Fall Risk   Falls in the past year? 0 0 0 0 1  Number falls in past yr: 0 0 0 0 0  Injury with Fall? 0 0 0 1 1  Comment    broken left pinky finger on fall   Risk for fall due to : History of fall(s)   No Fall Risks History of fall(s)  Follow up Falls evaluation completed;Education provided;Falls prevention discussed Falls evaluation completed Falls evaluation completed Falls prevention discussed Falls evaluation completed    MEDICARE RISK AT HOME:  Medicare Risk at Home Any stairs in or around the home?: (Patient-Rptd) No If so, are there any without handrails?: (Patient-Rptd) No Home free of loose throw rugs in walkways, pet beds,  electrical cords, etc?: (Patient-Rptd) Yes Adequate lighting in your home to reduce risk of falls?: (Patient-Rptd) Yes Life alert?: (Patient-Rptd) No Use of a cane, walker or w/c?: (Patient-Rptd) No Grab bars in the bathroom?: (Patient-Rptd) Yes Shower chair or bench in shower?: (Patient-Rptd) Yes Elevated toilet seat or a handicapped toilet?: (Patient-Rptd) Yes  TIMED UP AND GO:  Was the test performed?  No  Cognitive Function: 6CIT completed        10/25/2023    9:44 AM 06/03/2022   10:27 AM  6CIT Screen  What Year? 0 points 0 points  What month? 0 points 0 points  What time? 0 points 0 points  Count back from 20 0 points 0 points  Months in reverse 0 points 0 points  Repeat phrase 0 points 0 points  Total Score 0 points 0 points    Immunizations Immunization History  Administered Date(s) Administered   Fluad Quad(high Dose 65+) 12/26/2018, 12/07/2020, 12/02/2021   Fluad Trivalent(High Dose 65+) 12/02/2022   Hepatitis A 08/07/2008, 07/28/2010    Hepatitis A, Adult 08/07/2008, 07/28/2010   Hepatitis B 10/15/1984, 01/14/1985, 07/15/1996, 08/24/2009   Hepatitis B, ADULT 10/15/1984, 01/14/1985, 07/15/1996, 08/24/2009   INFLUENZA, HIGH DOSE SEASONAL PF 11/27/2017   Influenza Nasal 12/16/2011   Influenza Whole 10/16/2010   Influenza,inj,Quad PF,6+ Mos 12/03/2013, 12/10/2014   Influenza-Unspecified 10/15/2016   MMR 08/13/2008   OPV 08/12/1958   PFIZER(Purple Top)SARS-COV-2 Vaccination 03/22/2019, 04/16/2019, 02/13/2020   Pfizer Covid-19 Vaccine Bivalent Booster 50yrs & up 02/16/2021   Pneumococcal Conjugate-13 03/29/2016   Pneumococcal Polysaccharide-23 04/17/2017   Td 07/04/2006, 01/16/2023   Td (Adult), 2 Lf Tetanus Toxid, Preservative Free 07/04/2006   Tdap 05/16/2013   Typhoid Inactivated 07/18/2012   Yellow Fever 07/18/2012   Zoster, Live 09/06/2012    Screening Tests Health Maintenance  Topic Date Due   Zoster Vaccines- Shingrix (1 of 2) 08/11/1969   DEXA SCAN  07/15/2023   COVID-19 Vaccine (5 - 2025-26 season) 11/05/2023 (Originally 10/16/2023)   Influenza Vaccine  11/29/2023 (Originally 09/15/2023)   Medicare Annual Wellness (AWV)  10/24/2024   Colonoscopy  06/03/2025   MAMMOGRAM  08/14/2025   DTaP/Tdap/Td (4 - Td or Tdap) 01/15/2033   Pneumococcal Vaccine: 50+ Years  Completed   Hepatitis C Screening  Completed   HPV VACCINES  Aged Out   Meningococcal B Vaccine  Aged Out   Hepatitis B Vaccines 19-59 Average Risk  Discontinued    Health Maintenance Items Addressed:patient declined  Additional Screening:  Vision Screening: Recommended annual ophthalmology exams for early detection of glaucoma and other disorders of the eye. Is the patient up to date with their annual eye exam?  No  Who is the provider or what is the name of the office in which the patient attends annual eye exams?   Dental Screening: Recommended annual dental exams for proper oral hygiene  Community Resource Referral / Chronic Care  Management: CRR required this visit?  No   CCM required this visit?  No   Plan:    I have personally reviewed and noted the following in the patient's chart:   Medical and social history Use of alcohol, tobacco or illicit drugs  Current medications and supplements including opioid prescriptions. Patient is not currently taking opioid prescriptions. Functional ability and status Nutritional status Physical activity Advanced directives List of other physicians Hospitalizations, surgeries, and ER visits in previous 12 months Vitals Screenings to include cognitive, depression, and falls Referrals and appointments  In addition, I have reviewed and discussed  with patient certain preventive protocols, quality metrics, and best practice recommendations. A written personalized care plan for preventive services as well as general preventive health recommendations were provided to patient.   Lyle MARLA Right, [place]   10/25/2023   After Visit Summary: (MyChart) Due to this being a telephonic visit, the after visit summary with patients personalized plan was offered to patient via MyChart   Notes: Nothing significant to report at this time.

## 2023-10-25 NOTE — Patient Instructions (Signed)
 [name],  Thank you for taking the time for your Medicare Wellness Visit. I appreciate your continued commitment to your health goals. Please review the care plan we discussed, and feel free to reach out if I can assist you further.  Medicare recommends these wellness visits once per year to help you and your care team stay ahead of potential health issues. These visits are designed to focus on prevention, allowing your provider to concentrate on managing your acute and chronic conditions during your regular appointments.  Please note that Annual Wellness Visits do not include a physical exam. Some assessments may be limited, especially if the visit was conducted virtually. If needed, we may recommend a separate in-person follow-up with your provider.  Ongoing Care Seeing your primary care provider every 3 to 6 months helps us  monitor your health and provide consistent, personalized care.   Referrals If a referral was made during today's visit and you haven't received any updates within two weeks, please contact the referred provider directly to check on the status.  Recommended Screenings:  Health Maintenance  Topic Date Due   Zoster (Shingles) Vaccine (1 of 2) 08/11/1969   DEXA scan (bone density measurement)  07/15/2023   COVID-19 Vaccine (5 - 2025-26 season) 11/05/2023*   Flu Shot  11/29/2023*   Medicare Annual Wellness Visit  10/24/2024   Colon Cancer Screening  06/03/2025   Mammogram  08/14/2025   DTaP/Tdap/Td vaccine (4 - Td or Tdap) 01/15/2033   Pneumococcal Vaccine for age over 30  Completed   Hepatitis C Screening  Completed   HPV Vaccine  Aged Out   Meningitis B Vaccine  Aged Out   Hepatitis B Vaccine  Discontinued  *Topic was postponed. The date shown is not the original due date.       10/25/2023    9:38 AM  Advanced Directives  Does Patient Have a Medical Advance Directive? Yes  Type of Kelsey Church;Living will  Does patient  want to make changes to medical advance directive? No - Patient declined  Copy of Healthcare Power of Attorney in Chart? No - copy requested   Advance Care Planning is important because it: Ensures you receive medical care that aligns with your values, goals, and preferences. Provides guidance to your family and loved ones, reducing the emotional burden of decision-making during critical moments.  Vision: Annual vision screenings are recommended for early detection of glaucoma, cataracts, and diabetic retinopathy. These exams can also reveal signs of chronic conditions such as diabetes and high blood pressure.  Dental: Annual dental screenings help detect early signs of oral cancer, gum disease, and other conditions linked to overall health, including heart disease and diabetes.  Please see the attached documents for additional preventive care recommendations.

## 2023-11-27 DIAGNOSIS — N184 Chronic kidney disease, stage 4 (severe): Secondary | ICD-10-CM | POA: Diagnosis not present

## 2023-11-28 ENCOUNTER — Ambulatory Visit: Admitting: Family Medicine

## 2023-11-28 ENCOUNTER — Encounter: Payer: Self-pay | Admitting: Family Medicine

## 2023-11-28 VITALS — BP 118/68 | HR 56 | Temp 98.0°F | Resp 18 | Ht 64.0 in | Wt 182.4 lb

## 2023-11-28 DIAGNOSIS — I25118 Atherosclerotic heart disease of native coronary artery with other forms of angina pectoris: Secondary | ICD-10-CM

## 2023-11-28 DIAGNOSIS — I10 Essential (primary) hypertension: Secondary | ICD-10-CM | POA: Diagnosis not present

## 2023-11-28 DIAGNOSIS — N184 Chronic kidney disease, stage 4 (severe): Secondary | ICD-10-CM | POA: Diagnosis not present

## 2023-11-28 DIAGNOSIS — E538 Deficiency of other specified B group vitamins: Secondary | ICD-10-CM

## 2023-11-28 DIAGNOSIS — E785 Hyperlipidemia, unspecified: Secondary | ICD-10-CM

## 2023-11-28 DIAGNOSIS — R232 Flushing: Secondary | ICD-10-CM | POA: Diagnosis not present

## 2023-11-28 DIAGNOSIS — F3342 Major depressive disorder, recurrent, in full remission: Secondary | ICD-10-CM | POA: Diagnosis not present

## 2023-11-28 DIAGNOSIS — Z23 Encounter for immunization: Secondary | ICD-10-CM | POA: Diagnosis not present

## 2023-11-28 LAB — COMPREHENSIVE METABOLIC PANEL WITH GFR
ALT: 12 U/L (ref 0–35)
AST: 13 U/L (ref 0–37)
Albumin: 4.6 g/dL (ref 3.5–5.2)
Alkaline Phosphatase: 75 U/L (ref 39–117)
BUN: 33 mg/dL — ABNORMAL HIGH (ref 6–23)
CO2: 27 meq/L (ref 19–32)
Calcium: 9.2 mg/dL (ref 8.4–10.5)
Chloride: 103 meq/L (ref 96–112)
Creatinine, Ser: 1.94 mg/dL — ABNORMAL HIGH (ref 0.40–1.20)
GFR: 25.27 mL/min — ABNORMAL LOW (ref >60.00)
Glucose, Bld: 93 mg/dL (ref 70–99)
Potassium: 4.8 meq/L (ref 3.5–5.1)
Sodium: 139 meq/L (ref 135–145)
Total Bilirubin: 0.4 mg/dL (ref 0.2–1.2)
Total Protein: 6.6 g/dL (ref 6.0–8.3)

## 2023-11-28 LAB — CBC WITH DIFFERENTIAL/PLATELET
Basophils Absolute: 0.1 K/uL (ref 0.0–0.1)
Basophils Relative: 1 % (ref 0.0–3.0)
Eosinophils Absolute: 0.2 K/uL (ref 0.0–0.7)
Eosinophils Relative: 2.7 % (ref 0.0–5.0)
HCT: 39.7 % (ref 36.0–46.0)
Hemoglobin: 12.4 g/dL (ref 12.0–15.0)
Lymphocytes Relative: 21.5 % (ref 12.0–46.0)
Lymphs Abs: 1.5 K/uL (ref 0.7–4.0)
MCHC: 31.3 g/dL (ref 30.0–36.0)
MCV: 78.4 fl (ref 78.0–100.0)
Monocytes Absolute: 0.6 K/uL (ref 0.1–1.0)
Monocytes Relative: 8.7 % (ref 3.0–12.0)
Neutro Abs: 4.5 K/uL (ref 1.4–7.7)
Neutrophils Relative %: 66.1 % (ref 43.0–77.0)
Platelets: 303 K/uL (ref 150.0–400.0)
RBC: 5.07 Mil/uL (ref 3.87–5.11)
RDW: 17.5 % — ABNORMAL HIGH (ref 11.5–15.5)
WBC: 6.8 K/uL (ref 4.0–10.5)

## 2023-11-28 LAB — LIPID PANEL
Cholesterol: 150 mg/dL (ref 0–200)
HDL: 68 mg/dL (ref >39.00)
LDL Cholesterol: 53 mg/dL (ref 0–99)
NonHDL: 81.99
Total CHOL/HDL Ratio: 2
Triglycerides: 145 mg/dL (ref 0.0–149.0)
VLDL: 29 mg/dL (ref 0.0–40.0)

## 2023-11-28 MED ORDER — CITALOPRAM HYDROBROMIDE 20 MG PO TABS
ORAL_TABLET | ORAL | 3 refills | Status: DC
Start: 2023-11-28 — End: 2023-11-28

## 2023-11-28 MED ORDER — CITALOPRAM HYDROBROMIDE 20 MG PO TABS: 20.0000 mg | ORAL_TABLET | Freq: Every day | ORAL | 3 refills | Status: AC

## 2023-11-28 NOTE — Assessment & Plan Note (Signed)
 Well controlled, no changes to meds. Encouraged heart healthy diet such as the DASH diet and exercise as tolerated.

## 2023-11-28 NOTE — Assessment & Plan Note (Signed)
 Per nephrology

## 2023-11-28 NOTE — Progress Notes (Signed)
 Subjective:    Patient ID: Kelsey Church, female    DOB: 09/28/1950, 73 y.o.   MRN: [id number]  Chief Complaint  Patient presents with   Follow-up    6 month     HPI Patient is in today for for f/u .  Discussed the use of AI scribe software for clinical note transcription with the patient, who gave verbal consent to proceed.  History of Present Illness Kelsey Church is a 73 year old female who presents with ongoing shortness of breath and sleep disturbances.  She experiences ongoing shortness of breath, particularly during exertion such as walking with a group or climbing stairs. This symptom has persisted since contracting COVID-19, which she believes has exacerbated her heart issues. Despite these challenges, she continues to walk her dog around her neighborhood and at the park, although she finds herself lagging behind others due to her symptoms.  She recently experienced a period of sleep disturbances, characterized by difficulty falling asleep at night despite avoiding screens and opting to read instead. This issue has since resolved, and she is now able to sleep better, though she is unsure what changes led to this improvement.  She has a history of cranial neuritis, which was treated with a course of prednisone . Initially, she suspected shingles due to sensitivity and pain behind her ear. She attributes the issue to wearing an old visor that pressed against her head, which she has since discarded.  Her current medications include Celebrex, which she takes at a dose of 20 mg, and Celexa . She had previously increased her Celebrex dose but did not notice any difference, so she returned to her usual dose.    Past Medical History:  Diagnosis Date   Allergy 12-24-19   Statin medicine   Arthritis 2000   PAIN AND OA LEFT KNEE; S/P RIGHT TOTAL KNEE ARTHROPLASTY 06/10/13   Cancer (HCC) 1992   melanoma in 2 sights   Chronic kidney disease 12/23/2020   Complication of anesthesia     chills every evening at sundown x 2 weeks- no fever   Depression 2010/12/24   Husband death   Dysphagia    Elevated serum creatinine    History of kidney cancer    left   History of shingles    NO RESIDUAL PROBLEMS   Hyperlipidemia 12/24/19   Hypertension    Pain    LOWER BACK PAIN   Sleep apnea 12-24-2010   CPAP    Past Surgical History:  Procedure Laterality Date   APPENDECTOMY  1958   CHOLECYSTECTOMY  1992   COLONOSCOPY     COLONOSCOPY W/ POLYPECTOMY     7 polyps   ESOPHAGEAL MANOMETRY N/A 03/17/2021   Procedure: ESOPHAGEAL MANOMETRY (EM);  Surgeon: Albertus Gordy HERO, MD;  Location: [facility];  Service: Gastroenterology;  Laterality: N/A;   FOOT SURGERY Right    removal plantar wart   GALLBLADDER SURGERY  02/14/1990   INJECTION KNEE  09/2011   right    JOINT REPLACEMENT  2015   Right. Left knee   KNEE ARTHROSCOPY  1996   LEFT HEART CATH AND CORONARY ANGIOGRAPHY N/A 07/11/2019   Procedure: LEFT HEART CATH AND CORONARY ANGIOGRAPHY;  Surgeon: Claudene Victory ORN, MD;  Location: [facility];  Service: Cardiovascular;  Laterality: N/A;   MELANOMA EXCISION  1992   right chin, back   MOHS SURGERY     nose   NEPHRECTOMY Left 12/23/20   POLYPECTOMY     SALPINGOOPHORECTOMY  2009   Right   TOTAL KNEE ARTHROPLASTY Right 06/10/2013   Procedure: RIGHT TOTAL KNEE ARTHROPLASTY;  Surgeon: Dempsey LULLA Moan, MD;  Location: [facility];  Service: Orthopedics;  Laterality: Right;   TOTAL KNEE ARTHROPLASTY Left 12/16/2013   Procedure: LEFT TOTAL KNEE ARTHROPLASTY;  Surgeon: Dempsey Moan LULLA, MD;  Location: [facility];  Service: Orthopedics;  Laterality: Left;    Family History  Problem Relation Age of Onset   Osteoporosis Mother    Breast cancer Mother        patient states mother has mastectomy due to fibrocystic breasts and MGM history of breast cancer   Heart disease Father    Heart attack Father 75       11   Hypertension Father    Stroke Sister    Hypertension Sister    Diabetes Sister    Colon  polyps Sister    Kidney disease Sister    Vision loss Sister    Breast cancer Maternal Grandmother 43 - 69   Thyroid  cancer Maternal Grandmother    Uterine cancer Maternal Grandmother    Cancer Maternal Grandmother     Social History   Socioeconomic History   Marital status: Widowed    Spouse name: Not on file   Number of children: 1   Years of education: Not on file   Highest education level: Associate degree: occupational, Scientist, product/process development, or vocational program  Occupational History   Occupation: CMA    Employer: [employer]  Tobacco Use   Smoking status: Never    Passive exposure: Never   Smokeless tobacco: Never  Vaping Use   Vaping status: Never Used  Substance and Sexual Activity   Alcohol use: No    Alcohol/week: 0.0 standard drinks of alcohol   Drug use: No   Sexual activity: Not Currently    Partners: Male    Birth control/protection: Post-menopausal    Comment: 1st intercourse- 18, partners- 1,   Other Topics Concern   Not on file  Social History Narrative   Lives alone, with dog.     Widow.  One son.    Caffienated drinks-no   Seat belt use often-yes   Regular Exercise-yes   Smoke alarm in the home-yes   Firearms/guns in the home-no   History of physical abuse-no            Social Drivers of Health   Financial Resource Strain: Low Risk  (11/27/2023)   Overall Financial Resource Strain (CARDIA)    Difficulty of Paying Living Expenses: Not very hard  Food Insecurity: Food Insecurity Present (11/27/2023)   Hunger Vital Sign    Worried About Running Out of Food in the Last Year: Sometimes true    Ran Out of Food in the Last Year: Sometimes true  Transportation Needs: No Transportation Needs (11/27/2023)   PRAPARE - Administrator, Civil Service (Medical): No    Lack of Transportation (Non-Medical): No  Physical Activity: Sufficiently Active (11/27/2023)   Exercise Vital Sign    Days of Exercise per Week: 7 days    Minutes of Exercise per  Session: 40 min  Stress: No Stress Concern Present (11/27/2023)   [employer] of Occupational Health - Occupational Stress Questionnaire    Feeling of Stress: Only a little  Social Connections: Moderately Integrated (11/27/2023)   Social Connection and Isolation Panel    Frequency of Communication with Friends and Family: More than three times a week    Frequency of Social Gatherings  with Friends and Family: More than three times a week    Attends Religious Services: More than 4 times per year    Active Member of Clubs or Organizations: Yes    Attends Banker Meetings: More than 4 times per year    Marital Status: Widowed  Intimate Partner Violence: Not At Risk (10/25/2023)   Humiliation, Afraid, Rape, and Kick questionnaire    Fear of Current or Ex-Partner: No    Emotionally Abused: No    Physically Abused: No    Sexually Abused: No    Outpatient Medications Prior to Visit  Medication Sig Dispense Refill   acetaminophen  (TYLENOL ) 650 MG CR tablet Take 1,300 mg by mouth every 8 (eight) hours as needed for pain.      aspirin  EC 81 MG tablet Take 1 tablet (81 mg total) by mouth daily. 90 tablet 3   cholecalciferol (VITAMIN D3) 25 MCG (1000 UNIT) tablet Take 1,000 Units by mouth daily.     docusate sodium  (COLACE) 100 MG capsule Take by mouth. 1 capsule daily sometimes twice a day     Evolocumab  (REPATHA  SURECLICK) 140 MG/ML SOAJ INJECT INTO THE SKIN EVERY 14 DAYS 6 mL 3   isosorbide  mononitrate (IMDUR ) 60 MG 24 hr tablet TAKE 1 TABLET DAILY 90 tablet 3   metoprolol  succinate (TOPROL -XL) 25 MG 24 hr tablet TAKE 2 TABLETS IN THE MORNING AND 1 TABLET EVERY  EVENING 270 tablet 3   nitroGLYCERIN  (NITROSTAT ) 0.4 MG SL tablet Place 1 tablet (0.4 mg total) under the tongue every 5 (five) minutes as needed for chest pain. 25 tablet 3   ranolazine  (RANEXA ) 500 MG 12 hr tablet TAKE 1 TABLET TWICE A DAY 180 tablet 3   vitamin B-12 (CYANOCOBALAMIN ) 500 MCG tablet Take 500 mcg by  mouth daily.     citalopram  (CELEXA ) 20 MG tablet 1 1/2 tab po qd 135 tablet 3   lidocaine  (XYLOCAINE ) 5 % ointment Apply 1 Application topically 3 (three) times daily as needed for moderate pain (pain score 4-6). 35.44 g 0   predniSONE  (DELTASONE ) 20 MG tablet Take 1 tablet (20 mg total) by mouth daily with breakfast. 5 tablet 0   No facility-administered medications prior to visit.    Allergies  Allergen Reactions   Atorvastatin     Rosuvastatin      40mg --Creatinine elevation    Review of Systems  Constitutional:  Negative for fever and malaise/fatigue.  HENT:  Negative for congestion.   Eyes:  Negative for blurred vision.  Respiratory:  Negative for cough and shortness of breath.   Cardiovascular:  Negative for chest pain, palpitations and leg swelling.  Gastrointestinal:  Negative for vomiting.  Musculoskeletal:  Negative for back pain.  Skin:  Negative for rash.  Neurological:  Negative for loss of consciousness and headaches.       Objective:    Physical Exam Vitals and nursing note reviewed.  Constitutional:      General: She is not in acute distress.    Appearance: Normal appearance. She is well-developed.  HENT:     Head: Normocephalic and atraumatic.  Eyes:     General: No scleral icterus.       Right eye: No discharge.        Left eye: No discharge.  Cardiovascular:     Rate and Rhythm: Normal rate and regular rhythm.     Heart sounds: No murmur heard. Pulmonary:     Effort: Pulmonary effort is normal. No respiratory distress.  Breath sounds: Normal breath sounds.  Musculoskeletal:        General: Normal range of motion.     Cervical back: Normal range of motion and neck supple.     Right lower leg: No edema.     Left lower leg: No edema.  Skin:    General: Skin is warm and dry.  Neurological:     Mental Status: She is alert and oriented to person, place, and time.  Psychiatric:        Mood and Affect: Mood normal.        Behavior: Behavior  normal.        Thought Content: Thought content normal.        Judgment: Judgment normal.     BP 118/68   Pulse (!) 56   Temp 98 F (36.7 C)   Resp 18   Ht 5' 4 (1.626 m)   Wt 182 lb 6.4 oz (82.7 kg)   SpO2 98%   BMI 31.31 kg/m  Wt Readings from Last 3 Encounters:  11/28/23 182 lb 6.4 oz (82.7 kg)  10/25/23 185 lb (83.9 kg)  10/20/23 183 lb 9.6 oz (83.3 kg)    Diabetic Foot Exam - Simple   No data filed    Lab Results  Component Value Date   WBC 6.8 11/28/2023   HGB 12.4 11/28/2023   HCT 39.7 11/28/2023   PLT 303.0 11/28/2023   GLUCOSE 93 11/28/2023   CHOL 150 11/28/2023   TRIG 145.0 11/28/2023   HDL 68.00 11/28/2023   LDLCALC 53 11/28/2023   ALT 12 11/28/2023   AST 13 11/28/2023   NA 139 11/28/2023   K 4.8 11/28/2023   CL 103 11/28/2023   CREATININE 1.94 (H) 11/28/2023   BUN 33 (H) 11/28/2023   CO2 27 11/28/2023   TSH 1.47 03/01/2023   INR 1.17 12/27/2013   HGBA1C 5.9 08/27/2020    Lab Results  Component Value Date   TSH 1.47 03/01/2023   Lab Results  Component Value Date   WBC 6.8 11/28/2023   HGB 12.4 11/28/2023   HCT 39.7 11/28/2023   MCV 78.4 11/28/2023   PLT 303.0 11/28/2023   Lab Results  Component Value Date   NA 139 11/28/2023   K 4.8 11/28/2023   CO2 27 11/28/2023   GLUCOSE 93 11/28/2023   BUN 33 (H) 11/28/2023   CREATININE 1.94 (H) 11/28/2023   BILITOT 0.4 11/28/2023   ALKPHOS 75 11/28/2023   AST 13 11/28/2023   ALT 12 11/28/2023   PROT 6.6 11/28/2023   ALBUMIN 4.6 11/28/2023   CALCIUM  9.2 11/28/2023   ANIONGAP 11 12/27/2013   EGFR 30 (L) 11/29/2022   GFR 25.27 (L) 11/28/2023   Lab Results  Component Value Date   CHOL 150 11/28/2023   Lab Results  Component Value Date   HDL 68.00 11/28/2023   Lab Results  Component Value Date   LDLCALC 53 11/28/2023   Lab Results  Component Value Date   TRIG 145.0 11/28/2023   Lab Results  Component Value Date   CHOLHDL 2 11/28/2023   Lab Results  Component Value Date    HGBA1C 5.9 08/27/2020       Assessment & Plan:  Vitamin B12 deficiency  Essential hypertension, benign Assessment & Plan: Well controlled, no changes to meds. Encouraged heart healthy diet such as the DASH diet and exercise as tolerated.    Orders: -     CBC with Differential/Platelet -  Comprehensive metabolic panel with GFR  Hyperlipidemia LDL goal <70 Assessment & Plan: Encourage heart healthy diet such as MIND or DASH diet, increase exercise, avoid trans fats, simple carbohydrates and processed foods, consider a krill or fish or flaxseed oil cap daily.    Orders: -     Comprehensive metabolic panel with GFR -     Lipid panel  Chronic kidney disease, stage 4 (severe) (HCC) Assessment & Plan: Per nephrology  Orders: -     Comprehensive metabolic panel with GFR  Hot flashes -     Thyroid  Panel With TSH  Recurrent major depressive disorder, in full remission -     Citalopram  Hydrobromide; Take 1 tablet (20 mg total) by mouth daily.  Dispense: 90 tablet; Refill: 3  Need for influenza vaccination -     Flu vaccine HIGH DOSE PF(Fluzone Trivalent)  Atherosclerotic heart disease of native coronary artery with other forms of angina pectoris Assessment & Plan: On repatha  F/u cardiology   Assessment and Plan Assessment & Plan Shortness of breath on exertion   Chronic shortness of breath on exertion persists since COVID-19 infection, especially during group walks or stair climbing. No acute changes noted. Continue current management regimen.  Cranial neuritis   A recent episode was successfully treated with prednisone , and symptoms have resolved. She has stopped using the problematic sun visor. Avoid tight-fitting headgear and consider wide-brimmed hats for sun protection.  Insomnia   A recent episode of insomnia resolved without intervention, and her sleep has improved without any identifiable changes in routine.  Medication management   The previously increased  Celebrex dosage showed no improvement, so she returned to the original dosage. She is currently taking 20 mg of Celebrex and Celexa  as prescribed. Continue the current medication regimen and monitor for efficacy and side effects.  General Health Maintenance   Routine health maintenance was discussed, and she received a recent flu vaccination. Labs for cholesterol and thyroid  function are planned to address hot flashes and general health. Perform these lab tests.    Dinia Joynt R Lowne Chase, DO

## 2023-11-28 NOTE — Assessment & Plan Note (Signed)
 Encourage heart healthy diet such as MIND or DASH diet, increase exercise, avoid trans fats, simple carbohydrates and processed foods, consider a krill or fish or flaxseed oil cap daily.

## 2023-11-28 NOTE — Assessment & Plan Note (Signed)
 On repatha  F/u cardiology

## 2023-11-29 LAB — THYROID PANEL WITH TSH
Free Thyroxine Index: 1.9 (ref 1.4–3.8)
T3 Uptake: 25 % (ref 22–35)
T4, Total: 7.5 ug/dL (ref 5.1–11.9)
TSH: 1.79 m[IU]/L (ref 0.40–4.50)

## 2023-12-04 DIAGNOSIS — I129 Hypertensive chronic kidney disease with stage 1 through stage 4 chronic kidney disease, or unspecified chronic kidney disease: Secondary | ICD-10-CM | POA: Diagnosis not present

## 2023-12-04 DIAGNOSIS — I2510 Atherosclerotic heart disease of native coronary artery without angina pectoris: Secondary | ICD-10-CM | POA: Diagnosis not present

## 2023-12-04 DIAGNOSIS — E875 Hyperkalemia: Secondary | ICD-10-CM | POA: Diagnosis not present

## 2023-12-04 DIAGNOSIS — N184 Chronic kidney disease, stage 4 (severe): Secondary | ICD-10-CM | POA: Diagnosis not present

## 2023-12-04 DIAGNOSIS — Z905 Acquired absence of kidney: Secondary | ICD-10-CM | POA: Diagnosis not present

## 2023-12-04 DIAGNOSIS — N2581 Secondary hyperparathyroidism of renal origin: Secondary | ICD-10-CM | POA: Diagnosis not present

## 2023-12-04 DIAGNOSIS — D631 Anemia in chronic kidney disease: Secondary | ICD-10-CM | POA: Diagnosis not present

## 2023-12-05 ENCOUNTER — Telehealth: Payer: Self-pay

## 2023-12-05 NOTE — Telephone Encounter (Signed)
 Copied from CRM #[id number]. Topic: Clinical - Medication Question >> Dec 04, 2023  3:06 PM [place] A wrote: Reason for CRM: Mitzie with [facility] call and said they need clarification for citalopram  (CELEXA ) 20 MG tablet; Please call [phone] reference # [id number]. She said it it needs to be clarified within 25 hours

## 2023-12-05 NOTE — Telephone Encounter (Signed)
 Spoke w/ [facility]- informed per PCP's note on 11/28/23, Pt has only been doing citalopram  20mg  1 tablet daily. They wanted to verify change in instructions. Informed of change.

## 2023-12-06 ENCOUNTER — Ambulatory Visit: Payer: Self-pay | Admitting: Family Medicine

## 2023-12-18 DIAGNOSIS — Z85528 Personal history of other malignant neoplasm of kidney: Secondary | ICD-10-CM | POA: Diagnosis not present

## 2023-12-20 ENCOUNTER — Other Ambulatory Visit

## 2023-12-22 ENCOUNTER — Ambulatory Visit
Admission: RE | Admit: 2023-12-22 | Discharge: 2023-12-22 | Disposition: A | Discharge status: Home / Self Care | Source: Ambulatory Visit | Attending: Urology | Admitting: Urology

## 2023-12-22 DIAGNOSIS — C649 Malignant neoplasm of unspecified kidney, except renal pelvis: Secondary | ICD-10-CM | POA: Diagnosis not present

## 2023-12-22 DIAGNOSIS — Z85528 Personal history of other malignant neoplasm of kidney: Secondary | ICD-10-CM

## 2023-12-22 MED ORDER — GADOPICLENOL 0.5 MMOL/ML IV SOLN
8.0000 mL | Freq: Once | INTRAVENOUS | Status: AC | PRN
  Administered 2023-12-22: 8 mL via INTRAVENOUS

## 2023-12-23 ENCOUNTER — Encounter (HOSPITAL_BASED_OUTPATIENT_CLINIC_OR_DEPARTMENT_OTHER): Payer: Self-pay | Admitting: Emergency Medicine

## 2023-12-23 ENCOUNTER — Other Ambulatory Visit: Payer: Self-pay

## 2023-12-23 ENCOUNTER — Emergency Department (HOSPITAL_BASED_OUTPATIENT_CLINIC_OR_DEPARTMENT_OTHER): Admission: EM | Admit: 2023-12-23 | Discharge: 2023-12-23 | Disposition: A | Discharge status: Home / Self Care

## 2023-12-23 DIAGNOSIS — Z7982 Long term (current) use of aspirin: Secondary | ICD-10-CM | POA: Insufficient documentation

## 2023-12-23 DIAGNOSIS — L509 Urticaria, unspecified: Secondary | ICD-10-CM | POA: Insufficient documentation

## 2023-12-23 DIAGNOSIS — R21 Rash and other nonspecific skin eruption: Secondary | ICD-10-CM | POA: Diagnosis present

## 2023-12-23 MED ORDER — HYDROXYZINE HCL 25 MG PO TABS: 25.0000 mg | ORAL_TABLET | Freq: Four times a day (QID) | ORAL | 0 refills | Status: DC

## 2023-12-23 MED ORDER — HYDROXYZINE HCL 25 MG PO TABS
25.0000 mg | ORAL_TABLET | Freq: Once | ORAL | Status: AC
  Administered 2023-12-23: 25 mg via ORAL
  Filled 2023-12-23: qty 1

## 2023-12-23 MED ORDER — DEXAMETHASONE 4 MG PO TABS
4.0000 mg | ORAL_TABLET | Freq: Once | ORAL | Status: AC
  Administered 2023-12-23: 4 mg via ORAL
  Filled 2023-12-23: qty 1

## 2023-12-23 NOTE — Discharge Instructions (Signed)
 Please follow-up with your primary doctor.  Return for fevers, chills, tongue or lip swelling, difficulty swallowing, difficulty breathing, uncontrolled nausea vomiting or any new or worsening symptoms that are concerning to you.

## 2023-12-23 NOTE — ED Provider Notes (Signed)
 [place] EMERGENCY DEPARTMENT AT [facility] [place] Provider Note   CSN: [id number] Arrival date & time: 12/23/23  1223     Patient presents with: Allergic Reaction   Kelsey Church is a 73 y.o. female.   This is a 73 year old female presenting emergency department for rash.  It is itchy.  Started yesterday after she had MRI.  Rash largely located over torso, back, waistline.  No angioedema tongue swelling chest pain shortness of breath nausea vomiting.   Allergic Reaction      Prior to Admission medications   Medication Sig Start Date End Date Taking? Authorizing Provider  acetaminophen  (TYLENOL ) 650 MG CR tablet Take 1,300 mg by mouth every 8 (eight) hours as needed for pain.     [provider]  aspirin  EC 81 MG tablet Take 1 tablet (81 mg total) by mouth daily. 07/05/19   Lavona Agent, MD  cholecalciferol (VITAMIN D3) 25 MCG (1000 UNIT) tablet Take 1,000 Units by mouth daily.    [provider]  citalopram  (CELEXA ) 20 MG tablet Take 1 tablet (20 mg total) by mouth daily. 11/28/23   Antonio Cyndee Jamee JONELLE, DO  docusate sodium  (COLACE) 100 MG capsule Take by mouth. 1 capsule daily sometimes twice a day    [provider]  Evolocumab  (REPATHA  SURECLICK) 140 MG/ML SOAJ INJECT INTO THE SKIN EVERY 14 DAYS 05/15/23   Lavona Agent, MD  isosorbide  mononitrate (IMDUR ) 60 MG 24 hr tablet TAKE 1 TABLET DAILY 09/06/23   Lavona Agent, MD  metoprolol  succinate (TOPROL -XL) 25 MG 24 hr tablet TAKE 2 TABLETS IN THE MORNING AND 1 TABLET EVERY  EVENING 09/06/23   Lavona Agent, MD  nitroGLYCERIN  (NITROSTAT ) 0.4 MG SL tablet Place 1 tablet (0.4 mg total) under the tongue every 5 (five) minutes as needed for chest pain. 04/28/22 11/28/23  Lavona Agent, MD  ranolazine  (RANEXA ) 500 MG 12 hr tablet TAKE 1 TABLET TWICE A DAY 09/06/23   Lavona Agent, MD  vitamin B-12 (CYANOCOBALAMIN ) 500 MCG tablet Take 500 mcg by mouth daily.    [provider]     Allergies: Atorvastatin  and Rosuvastatin     Review of Systems  Updated Vital Signs BP (!) 135/94 (BP Location: Right Arm)   Pulse 76   Temp 98.1 F (36.7 C)   Resp 19   Ht 5' 6 (1.676 m)   Wt 81.6 kg   SpO2 99%   BMI 29.05 kg/m   Physical Exam Vitals and nursing note reviewed.  HENT:     Nose: Nose normal.     Mouth/Throat:     Pharynx: Oropharynx is clear.  Cardiovascular:     Rate and Rhythm: Normal rate.  Pulmonary:     Effort: Pulmonary effort is normal.  Skin:    Capillary Refill: Capillary refill takes less than 2 seconds.     Findings: Rash present.  Neurological:     Mental Status: She is alert and oriented to person, place, and time.  Psychiatric:        Mood and Affect: Mood normal.        Behavior: Behavior normal.     (all labs ordered are listed, but only abnormal results are displayed) Labs Reviewed - No data to display  EKG: None  Radiology: MR ABDOMEN WWO CONTRAST Result Date: 12/22/2023 CLINICAL DATA:  Follow-up of renal cell carcinoma. Status post left nephrectomy. EXAM: MRI ABDOMEN WITHOUT AND WITH CONTRAST TECHNIQUE: Multiplanar multisequence MR imaging of the abdomen was performed both  before and after the administration of intravenous contrast. CONTRAST:  8 mL of Vueway . COMPARISON:  MRI abdomen from 06/17/2022. FINDINGS: Lower chest: Unremarkable MR appearance to the lung bases. No pleural effusion. No pericardial effusion. Normal heart size. Hepatobiliary: The liver is normal in size. Noncirrhotic configuration. No focal liver lesion. No intra or extrahepatic bile duct dilation. There is a sub 5 mm filling defect in the distal common bile duct, favored to represent choledocholithiasis. Surgically absent gallbladder. Pancreas: No mass, inflammatory changes or other parenchymal abnormality identified. No main pancreatic duct dilation. Spleen:  Within normal limits in size and appearance. No focal mass. Adrenals/Urinary Tract: Unremarkable  right adrenal gland. Patient is status post left adrenalectomy and nephrectomy. Redemonstration of heterogeneous, T2 hypointense and predominantly T1 hyperintense, 1.7 x 3.6 cm area in the left nephrectomy bed which contains a single subcentimeter sized cystic focus within. This is essentially unchanged since the prior study. No abnormal enhancement. Findings favor postsurgical changes/fat necrosis. Right kidney appears grossly within normal limits. There is right extrarenal pelvis. No suspicious mass. No hydroureteronephrosis. Stomach/Bowel: There is a tiny sliding hiatal hernia. Visualized portions within the abdomen are unremarkable. No disproportionate dilation of bowel loops. Vascular/Lymphatic: No pathologically enlarged lymph nodes identified. No abdominal aortic aneurysm demonstrated. No ascites. Other:  None. Musculoskeletal: No suspicious bone lesions identified. IMPRESSION: 1. Redemonstration of postsurgical changes in the left nephrectomy bed, as described above. No evidence of local recurrence or metastatic disease within the abdomen. 2. There is a sub 5 mm filling defect in the distal common bile duct, favored to represent choledocholithiasis. No intra or extrahepatic bile duct dilation. Surgically absent gallbladder. Electronically Signed   By: Ree Molt M.D.   On: 12/22/2023 14:14     Procedures   Medications Ordered in the ED  dexamethasone  (DECADRON ) tablet 4 mg (has no administration in time range)  hydrOXYzine  (ATARAX ) tablet 25 mg (has no administration in time range)                                    Medical Decision Making Is a 73 year old female presenting emergency department for rash.  Vital signs reassuring.  Delayed reaction secondary to her MRI with contrast?  She took Benadryl  last night and Zyrtec this morning still complaining of itching.  Rash consistent with hives/urticaria.  Does not appear to be bacterial nature.  Does not have physical exam findings  concerning for anaphylaxis.  Given a dose of Decadron  and Atarax  here.  Stable for discharge at this time.  Risk Prescription drug management.       Final diagnoses:  None    ED Discharge Orders     None          Neysa Caron PARAS, DO 12/23/23 1314

## 2023-12-23 NOTE — ED Triage Notes (Signed)
 Pt reports she had MRI with contrast yesterday; c/o itching all over since 3pm yesterday; has some redness to waistline and back; took Zyrtec this morning

## 2023-12-29 ENCOUNTER — Ambulatory Visit (INDEPENDENT_AMBULATORY_CARE_PROVIDER_SITE_OTHER): Admitting: Medical

## 2023-12-29 ENCOUNTER — Other Ambulatory Visit (HOSPITAL_BASED_OUTPATIENT_CLINIC_OR_DEPARTMENT_OTHER): Payer: Self-pay

## 2023-12-29 VITALS — BP 110/62 | HR 74 | Temp 97.8°F | Resp 16 | Ht 66.0 in | Wt 186.2 lb

## 2023-12-29 DIAGNOSIS — N1831 Chronic kidney disease, stage 3a: Secondary | ICD-10-CM | POA: Diagnosis not present

## 2023-12-29 DIAGNOSIS — T7840XD Allergy, unspecified, subsequent encounter: Secondary | ICD-10-CM

## 2023-12-29 DIAGNOSIS — N184 Chronic kidney disease, stage 4 (severe): Secondary | ICD-10-CM

## 2023-12-29 LAB — COMPREHENSIVE METABOLIC PANEL WITH GFR
AG Ratio: 2.3 (calc) (ref 1.0–2.5)
ALT: 22 U/L (ref 6–29)
AST: 14 U/L (ref 10–35)
Albumin: 4.3 g/dL (ref 3.6–5.1)
Alkaline phosphatase (APISO): 81 U/L (ref 37–153)
BUN/Creatinine Ratio: 17 (calc) (ref 6–22)
BUN: 33 mg/dL — ABNORMAL HIGH (ref 7–25)
CO2: 22 mmol/L (ref 20–32)
Calcium: 9.3 mg/dL (ref 8.6–10.4)
Chloride: 106 mmol/L (ref 98–110)
Creat: 1.95 mg/dL — ABNORMAL HIGH (ref 0.60–1.00)
Globulin: 1.9 g/dL (ref 1.9–3.7)
Glucose, Bld: 118 mg/dL — ABNORMAL HIGH (ref 65–99)
Potassium: 4.3 mmol/L (ref 3.5–5.3)
Sodium: 139 mmol/L (ref 135–146)
Total Bilirubin: 0.2 mg/dL (ref 0.2–1.2)
Total Protein: 6.2 g/dL (ref 6.1–8.1)
eGFR: 27 mL/min/1.73m2 — ABNORMAL LOW (ref >=60)

## 2023-12-29 MED ORDER — METHYLPREDNISOLONE 4 MG PO TBPK
ORAL_TABLET | ORAL | 0 refills | Status: AC
  Filled 2023-12-29: qty 21, 6d supply, fill #0

## 2023-12-29 NOTE — Progress Notes (Signed)
   Subjective:    Patient ID: Kelsey Church, female    DOB: 1950-12-21, 73 y.o.   MRN: [id number]  HPI JIN Church Kelsey is a 72 year old female who presents with persistent itching and rash following an MRI with contrast.  Severe itching and a rash began on December 23, 2023, after an MRI with contrast. The itching is primarily on her back, waistline, thighs, and legs, with a 'pinkish' rash visible around her waist. Two Benadryl  tablets taken initially were ineffective. On December 24, 2023, she received one medrol  4 mg tab  in the emergency department and was prescribed hydroxyzine  25 mg every six hours, which has not improved her symptoms. She struggles to apply cortisone spray to her back. She takes cool showers to manage symptoms and is cautious with hydroxyzine  due to drowsiness.  She has a history of kidney cancer diagnosed in 2022, requiring regular MRI scans. Cardiac issues include three blockages and an aortic condition identified in 2021. Her GFR is 25, and creatinine is 1.94. She follows up with her nephrologist every three months and her urologist annually. She took prednisone  for nerve inflammation two to three months ago without tapering the dose.   Review of Systems See hpi    Objective:   Physical Exam  General- No acute distress. Pleasant patient. Neck- Full range of motion, no jvd Lungs- Clear, even and unlabored. Heart- regular rate and rhythm. Neurologic- CNII- XII grossly intact.  Lower ext- no pedal edema, negative homans signs, calfs symmetric. Skin- mild diffuse pink rash lower back region from lower ribs down to hips. No warmth and no tenderness.       Assessment & Plan:   Patient Instructions  Pruritic rash (allergic reaction) after MRI contrast exposure Persistent pruritic rash post-MRI contrast, unresponsive to Benadryl  and hydroxyzine . - Initiated Medrol  taper: 6 day standard taper dose pack - Continue hydroxyzine  25 mg every 8 hours prn itching if  needed. - Advised on hydroxyzine  drowsiness side effect - Recommended low sugar diet during steroid use. - Ordered metabolic panel for kidney function.  Chronic kidney disease, stage 4 Stage 4 CKD with GFR 25, creatinine 1.94. No Medrol  dose adjustment needed. - Ordered metabolic panel for kidney function.  Follow up date to be determined after you update me on monday on how you are doing   Kelsey Maxwell, PA-C

## 2023-12-29 NOTE — Patient Instructions (Addendum)
 Pruritic rash (allergic reaction) after MRI contrast exposure Persistent pruritic rash post-MRI contrast, unresponsive to Benadryl  and hydroxyzine . - Initiated Medrol  taper: 6 day standard taper dose pack - Continue hydroxyzine  25 mg every 8 hours prn itching if needed. - Advised on hydroxyzine  drowsiness side effect - Recommended low sugar diet during steroid use. - Ordered metabolic panel for kidney function.  Chronic kidney disease, stage 4 Stage 4 CKD with GFR 25, creatinine 1.94. No Medrol  dose adjustment needed. - Ordered metabolic panel for kidney function.  Follow up date to be determined after you update me on monday on how you are doing

## 2024-01-01 ENCOUNTER — Encounter: Payer: Self-pay | Admitting: Medical

## 2024-01-01 DIAGNOSIS — C642 Malignant neoplasm of left kidney, except renal pelvis: Secondary | ICD-10-CM | POA: Diagnosis not present

## 2024-01-01 MED ORDER — HYDROXYZINE HCL 25 MG PO TABS
25.0000 mg | ORAL_TABLET | Freq: Three times a day (TID) | ORAL | 0 refills | Status: AC | PRN
  Filled 2024-01-01: qty 30, 10d supply, fill #0

## 2024-01-01 NOTE — Addendum Note (Signed)
 Addended by: DORINA DALLAS HERO on: 01/01/2024 08:41 PM   Modules accepted: Orders

## 2024-01-02 ENCOUNTER — Other Ambulatory Visit (HOSPITAL_BASED_OUTPATIENT_CLINIC_OR_DEPARTMENT_OTHER): Payer: Self-pay

## 2024-01-04 ENCOUNTER — Ambulatory Visit: Payer: Self-pay | Admitting: Family Medicine

## 2024-03-04 ENCOUNTER — Telehealth: Payer: Self-pay | Admitting: Cardiology

## 2024-03-04 NOTE — Telephone Encounter (Signed)
 Pt c/o medication issue:  1. Name of Medication:   Evolocumab  (REPATHA  SURECLICK) 140 MG/ML SOAJ   2. How are you currently taking this medication (dosage and times per day)?   As prescribed  3. Are you having a reaction (difficulty breathing--STAT)?   4. What is your medication issue?   Patient called to follow-up on renewing her Repatha  grant and wants advice on next steps.

## 2024-03-05 ENCOUNTER — Telehealth: Payer: Self-pay | Admitting: Pharmacy Technician

## 2024-03-05 NOTE — Telephone Encounter (Signed)
" ° ° ° °  Her grant is good until 04/17/24, can not renew until 03/20/24  "

## 2024-03-12 ENCOUNTER — Other Ambulatory Visit (HOSPITAL_COMMUNITY): Payer: Self-pay

## 2024-03-12 ENCOUNTER — Encounter: Payer: Self-pay | Admitting: Cardiology

## 2024-03-12 MED ORDER — ISOSORBIDE MONONITRATE ER 60 MG PO TB24: 60.0000 mg | ORAL_TABLET | Freq: Every day | ORAL | 0 refills | Status: AC

## 2024-03-14 ENCOUNTER — Ambulatory Visit (HOSPITAL_BASED_OUTPATIENT_CLINIC_OR_DEPARTMENT_OTHER): Admitting: Pulmonary Disease

## 2024-03-20 NOTE — Telephone Encounter (Signed)
 Patient Advocate Encounter   The patient was approved for a [employer] grant that will help cover the cost of repatha  Total amount awarded, 2500.  Effective: 04/18/24 - 04/17/25   APW:[id number] ERW:[id number] Hmnle:[id number] PI:[id number] [employer] ID: [id number]   Pharmacy provided with approval and processing information. Patient informed via mychart

## 2024-03-25 ENCOUNTER — Ambulatory Visit (HOSPITAL_BASED_OUTPATIENT_CLINIC_OR_DEPARTMENT_OTHER): Admitting: Pulmonary Disease

## 2024-04-15 ENCOUNTER — Ambulatory Visit (HOSPITAL_BASED_OUTPATIENT_CLINIC_OR_DEPARTMENT_OTHER): Admitting: Pulmonary Disease

## 2024-06-14 ENCOUNTER — Ambulatory Visit: Admitting: Cardiology

## 2024-10-30 ENCOUNTER — Ambulatory Visit
